# Patient Record
Sex: Male | Born: 1940 | Race: Black or African American | Hispanic: No | Marital: Married | Smoking: Former smoker
Health system: Southern US, Community
[De-identification: ages and names within clinical notes are randomized; demographics above are authoritative.]

## PROBLEM LIST (undated history)

## (undated) DIAGNOSIS — I219 Acute myocardial infarction, unspecified: Secondary | ICD-10-CM

## (undated) DIAGNOSIS — Z86718 Personal history of other venous thrombosis and embolism: Secondary | ICD-10-CM

## (undated) DIAGNOSIS — I2510 Atherosclerotic heart disease of native coronary artery without angina pectoris: Secondary | ICD-10-CM

## (undated) DIAGNOSIS — G4730 Sleep apnea, unspecified: Secondary | ICD-10-CM

## (undated) DIAGNOSIS — G4733 Obstructive sleep apnea (adult) (pediatric): Secondary | ICD-10-CM

## (undated) DIAGNOSIS — H269 Unspecified cataract: Secondary | ICD-10-CM

## (undated) DIAGNOSIS — E785 Hyperlipidemia, unspecified: Secondary | ICD-10-CM

## (undated) DIAGNOSIS — Z8739 Personal history of other diseases of the musculoskeletal system and connective tissue: Secondary | ICD-10-CM

## (undated) DIAGNOSIS — E669 Obesity, unspecified: Secondary | ICD-10-CM

## (undated) DIAGNOSIS — R609 Edema, unspecified: Secondary | ICD-10-CM

## (undated) DIAGNOSIS — I639 Cerebral infarction, unspecified: Secondary | ICD-10-CM

## (undated) DIAGNOSIS — M1990 Unspecified osteoarthritis, unspecified site: Secondary | ICD-10-CM

## (undated) DIAGNOSIS — I10 Essential (primary) hypertension: Secondary | ICD-10-CM

## (undated) DIAGNOSIS — N289 Disorder of kidney and ureter, unspecified: Secondary | ICD-10-CM

## (undated) HISTORY — DX: Personal history of other venous thrombosis and embolism: Z86.718

## (undated) HISTORY — DX: Obesity, unspecified: E66.9

## (undated) HISTORY — DX: Unspecified osteoarthritis, unspecified site: M19.90

## (undated) HISTORY — DX: Atherosclerotic heart disease of native coronary artery without angina pectoris: I25.10

## (undated) HISTORY — DX: Sleep apnea, unspecified: G47.30

## (undated) HISTORY — PX: OTHER SURGICAL HISTORY: SHX169

## (undated) HISTORY — DX: Acute myocardial infarction, unspecified: I21.9

## (undated) HISTORY — DX: Obstructive sleep apnea (adult) (pediatric): G47.33

## (undated) HISTORY — DX: Essential (primary) hypertension: I10

## (undated) HISTORY — DX: Cerebral infarction, unspecified: I63.9

## (undated) HISTORY — DX: Hyperlipidemia, unspecified: E78.5

## (undated) HISTORY — DX: Edema, unspecified: R60.9

## (undated) HISTORY — DX: Disorder of kidney and ureter, unspecified: N28.9

## (undated) HISTORY — DX: Personal history of other diseases of the musculoskeletal system and connective tissue: Z87.39

---

## 1998-03-02 DIAGNOSIS — I639 Cerebral infarction, unspecified: Secondary | ICD-10-CM

## 1998-03-02 HISTORY — DX: Cerebral infarction, unspecified: I63.9

## 1999-01-02 ENCOUNTER — Ambulatory Visit (HOSPITAL_BASED_OUTPATIENT_CLINIC_OR_DEPARTMENT_OTHER): Admission: RE | Admit: 1999-01-02 | Discharge: 1999-01-03 | Payer: Self-pay | Admitting: Urology

## 2003-03-03 HISTORY — PX: CORONARY ARTERY BYPASS GRAFT: SHX141

## 2003-03-17 ENCOUNTER — Inpatient Hospital Stay (HOSPITAL_COMMUNITY): Admission: EM | Admit: 2003-03-17 | Discharge: 2003-03-20 | Payer: Self-pay | Admitting: Emergency Medicine

## 2003-03-19 ENCOUNTER — Encounter (INDEPENDENT_AMBULATORY_CARE_PROVIDER_SITE_OTHER): Payer: Self-pay | Admitting: *Deleted

## 2003-04-21 ENCOUNTER — Emergency Department (HOSPITAL_COMMUNITY): Admission: EM | Admit: 2003-04-21 | Discharge: 2003-04-21 | Payer: Self-pay | Admitting: Emergency Medicine

## 2003-12-03 ENCOUNTER — Inpatient Hospital Stay (HOSPITAL_COMMUNITY): Admission: EM | Admit: 2003-12-03 | Discharge: 2003-12-14 | Payer: Self-pay | Admitting: Emergency Medicine

## 2004-01-28 ENCOUNTER — Encounter (HOSPITAL_COMMUNITY): Admission: RE | Admit: 2004-01-28 | Discharge: 2004-04-27 | Payer: Self-pay | Admitting: Cardiology

## 2004-02-01 ENCOUNTER — Ambulatory Visit: Payer: Self-pay

## 2004-03-12 ENCOUNTER — Ambulatory Visit: Payer: Self-pay | Admitting: Cardiology

## 2004-04-08 ENCOUNTER — Ambulatory Visit: Payer: Self-pay | Admitting: Cardiology

## 2004-04-21 ENCOUNTER — Ambulatory Visit (HOSPITAL_BASED_OUTPATIENT_CLINIC_OR_DEPARTMENT_OTHER): Admission: RE | Admit: 2004-04-21 | Discharge: 2004-04-21 | Payer: Self-pay | Admitting: Cardiology

## 2004-04-21 ENCOUNTER — Encounter: Payer: Self-pay | Admitting: Pulmonary Disease

## 2004-04-25 ENCOUNTER — Ambulatory Visit: Payer: Self-pay | Admitting: Pulmonary Disease

## 2004-04-28 ENCOUNTER — Encounter (HOSPITAL_COMMUNITY): Admission: RE | Admit: 2004-04-28 | Discharge: 2004-07-27 | Payer: Self-pay | Admitting: Cardiology

## 2004-06-27 ENCOUNTER — Ambulatory Visit: Payer: Self-pay | Admitting: Pulmonary Disease

## 2004-07-25 ENCOUNTER — Ambulatory Visit: Payer: Self-pay | Admitting: Pulmonary Disease

## 2004-08-08 ENCOUNTER — Ambulatory Visit: Payer: Self-pay | Admitting: Cardiology

## 2004-11-18 ENCOUNTER — Ambulatory Visit: Payer: Self-pay | Admitting: Cardiology

## 2005-04-22 ENCOUNTER — Inpatient Hospital Stay (HOSPITAL_COMMUNITY): Admission: EM | Admit: 2005-04-22 | Discharge: 2005-04-24 | Payer: Self-pay | Admitting: Emergency Medicine

## 2005-08-28 ENCOUNTER — Ambulatory Visit: Payer: Self-pay | Admitting: Internal Medicine

## 2005-10-09 ENCOUNTER — Ambulatory Visit: Payer: Self-pay | Admitting: Internal Medicine

## 2005-10-12 ENCOUNTER — Emergency Department (HOSPITAL_COMMUNITY): Admission: EM | Admit: 2005-10-12 | Discharge: 2005-10-13 | Payer: Self-pay | Admitting: Emergency Medicine

## 2006-02-12 ENCOUNTER — Ambulatory Visit: Payer: Self-pay | Admitting: Internal Medicine

## 2006-07-15 ENCOUNTER — Ambulatory Visit: Payer: Self-pay | Admitting: Internal Medicine

## 2007-02-15 ENCOUNTER — Ambulatory Visit: Payer: Self-pay | Admitting: Internal Medicine

## 2007-02-15 LAB — CONVERTED CEMR LAB
ALT: 16 units/L (ref 0–53)
AST: 20 units/L (ref 0–37)
Cholesterol: 112 mg/dL (ref 0–200)
HDL: 26.7 mg/dL — ABNORMAL LOW (ref >39.0)
LDL Cholesterol: 69 mg/dL (ref 0–99)
Total CHOL/HDL Ratio: 4.2
Triglycerides: 84 mg/dL (ref 0–149)
VLDL: 17 mg/dL (ref 0–40)

## 2007-03-03 DIAGNOSIS — I219 Acute myocardial infarction, unspecified: Secondary | ICD-10-CM

## 2007-03-03 HISTORY — DX: Acute myocardial infarction, unspecified: I21.9

## 2007-03-18 ENCOUNTER — Ambulatory Visit: Payer: Self-pay | Admitting: Internal Medicine

## 2007-12-01 ENCOUNTER — Ambulatory Visit: Payer: Self-pay | Admitting: Internal Medicine

## 2008-05-10 ENCOUNTER — Encounter: Admission: RE | Admit: 2008-05-10 | Discharge: 2008-05-10 | Payer: Self-pay | Admitting: Internal Medicine

## 2008-07-16 ENCOUNTER — Telehealth: Payer: Self-pay | Admitting: Internal Medicine

## 2008-08-13 LAB — HM COLONOSCOPY

## 2008-10-09 ENCOUNTER — Encounter (INDEPENDENT_AMBULATORY_CARE_PROVIDER_SITE_OTHER): Payer: Self-pay | Admitting: Emergency Medicine

## 2008-10-09 ENCOUNTER — Inpatient Hospital Stay (HOSPITAL_COMMUNITY): Admission: EM | Admit: 2008-10-09 | Discharge: 2008-10-12 | Payer: Self-pay | Admitting: Emergency Medicine

## 2008-10-09 ENCOUNTER — Encounter (INDEPENDENT_AMBULATORY_CARE_PROVIDER_SITE_OTHER): Payer: Self-pay | Admitting: Internal Medicine

## 2008-10-09 ENCOUNTER — Ambulatory Visit: Payer: Self-pay | Admitting: Vascular Surgery

## 2009-01-16 ENCOUNTER — Telehealth (INDEPENDENT_AMBULATORY_CARE_PROVIDER_SITE_OTHER): Payer: Self-pay | Admitting: *Deleted

## 2009-04-01 ENCOUNTER — Encounter: Payer: Self-pay | Admitting: Internal Medicine

## 2009-05-10 ENCOUNTER — Encounter: Payer: Self-pay | Admitting: Internal Medicine

## 2009-05-13 ENCOUNTER — Encounter: Payer: Self-pay | Admitting: Internal Medicine

## 2009-05-30 DIAGNOSIS — I2510 Atherosclerotic heart disease of native coronary artery without angina pectoris: Secondary | ICD-10-CM | POA: Insufficient documentation

## 2009-05-30 DIAGNOSIS — E785 Hyperlipidemia, unspecified: Secondary | ICD-10-CM

## 2009-05-30 DIAGNOSIS — I10 Essential (primary) hypertension: Secondary | ICD-10-CM | POA: Insufficient documentation

## 2009-05-30 DIAGNOSIS — G4733 Obstructive sleep apnea (adult) (pediatric): Secondary | ICD-10-CM

## 2009-05-31 ENCOUNTER — Ambulatory Visit: Payer: Self-pay | Admitting: Internal Medicine

## 2009-05-31 DIAGNOSIS — E669 Obesity, unspecified: Secondary | ICD-10-CM

## 2009-06-03 ENCOUNTER — Encounter: Payer: Self-pay | Admitting: Internal Medicine

## 2009-06-25 ENCOUNTER — Ambulatory Visit: Payer: Self-pay | Admitting: Pulmonary Disease

## 2009-07-30 ENCOUNTER — Ambulatory Visit: Payer: Self-pay | Admitting: Pulmonary Disease

## 2009-08-19 ENCOUNTER — Telehealth: Payer: Self-pay | Admitting: Pulmonary Disease

## 2009-08-29 ENCOUNTER — Encounter: Admission: RE | Admit: 2009-08-29 | Discharge: 2009-08-29 | Payer: Self-pay | Admitting: Internal Medicine

## 2009-11-16 ENCOUNTER — Encounter: Payer: Self-pay | Admitting: Pulmonary Disease

## 2009-11-21 ENCOUNTER — Encounter
Admission: RE | Admit: 2009-11-21 | Discharge: 2009-11-28 | Payer: Self-pay | Source: Home / Self Care | Admitting: Pulmonary Disease

## 2009-12-03 ENCOUNTER — Emergency Department (HOSPITAL_COMMUNITY): Admission: EM | Admit: 2009-12-03 | Discharge: 2009-12-03 | Payer: Self-pay | Admitting: Emergency Medicine

## 2010-02-03 ENCOUNTER — Telehealth: Payer: Self-pay | Admitting: Internal Medicine

## 2010-02-05 ENCOUNTER — Telehealth: Payer: Self-pay | Admitting: Pulmonary Disease

## 2010-02-11 ENCOUNTER — Ambulatory Visit: Payer: Self-pay | Admitting: Pulmonary Disease

## 2010-03-20 ENCOUNTER — Encounter
Admission: RE | Admit: 2010-03-20 | Discharge: 2010-03-20 | Payer: Self-pay | Source: Home / Self Care | Attending: Internal Medicine | Admitting: Internal Medicine

## 2010-04-01 NOTE — Miscellaneous (Signed)
Summary: optimal pressure 11cm  Clinical Lists Changes  Orders: Added new Referral order of DME Referral (DME) - Signed auto download shows good compliance, no leak of signif., and optimal pressure11cm

## 2010-04-01 NOTE — Miscellaneous (Signed)
  Clinical Lists Changes  Medications: Removed medication of NIASPAN 500 MG CR-TABS (NIACIN (ANTIHYPERLIPIDEMIC)) Take 1 by mouth at bedtime Added new medication of NIASPAN 1000 MG CR-TABS (NIACIN (ANTIHYPERLIPIDEMIC)) 1 tab evry night before bedtime - Signed Rx of NIASPAN 1000 MG CR-TABS (NIACIN (ANTIHYPERLIPIDEMIC)) 1 tab evry night before bedtime;  #30 x 6;  Signed;  Entered by: Francetta Found, RN, BSN;  Authorized by: Annye Rusk, MD, [facility];  Method used: Electronically to [facility] [address] [place], [place], [place]  [address], Ph: [phone], Fax: [phone]    Prescriptions: NIASPAN 1000 MG CR-TABS (NIACIN (ANTIHYPERLIPIDEMIC)) 1 tab evry night before bedtime  #30 x 6   Entered by:   Francetta Found, RN, BSN   Authorized by:   Annye Rusk, MD, [facility]   Signed by:   Francetta Found, RN, BSN on 06/03/2009   Method used:   Electronically to        [facility] (retail)       [address]       [place], [place]  [address]       Ph: [phone]       Fax: [phone]   RxID:   [id number]

## 2010-04-01 NOTE — Assessment & Plan Note (Signed)
Summary: rov/sob/jss   Primary Provider:  Marlou Sa  CC:  ROV and recommended by Dr. Jerilee Hoh complaints.  History of Present Illness: Patient is a 70 year old with a history of CAD, dyslpiidemia, hypertension and sleep apnea He was last seen several years ago.  He is s/p CABG x 4 in 2005 (LIMA to LAD; SVG to OM1/OM2; SVG to RCA). Since seen he denies chest pain.  Breathing is ok.  He is not using his CPAP  He has a hard time tolerating it.    Problems Prior to Update: 1)  Obstructive Sleep Apnea  (ICD-327.23) 2)  Hyperlipidemia  (ICD-272.4) 3)  Hypertension  (ICD-401.9) 4)  Cad  (ICD-414.00)  Current Medications (verified): 1)  Viagra 50 Mg Tabs (Sildenafil Citrate) .... Take 1 Tablet By Mouth Single Dose 2)  Lovastatin 40 Mg Tabs (Lovastatin) .[name].. 1 Tab Once Daily 3)  Metoprolol Tartrate 25 Mg Tabs (Metoprolol Tartrate) .... Take 3 By Mouth Two Times A Day 4)  Aspirin 81 Mg Tbec (Aspirin) .... Take One Tablet By Mouth Daily 5)  Furosemide 20 Mg Tabs (Furosemide) .... Take One Tablet By Mouth Two Times A Day 6)  Benicar 40 Mg Tabs (Olmesartan Medoxomil) .... Take One Tablet By Mouth Daily 7)  Niaspan 500 Mg Cr-Tabs (Niacin (Antihyperlipidemic)) .... Take 1 By Mouth At Bedtime  Allergies (verified): 1)  ! * Altace  Past History:  Past medical, surgical, family and social histories (including risk factors) reviewed, and no changes noted (except as noted below).  Past Medical History:  1. Coronary artery disease, status post a cath in 2005, with a       quadruple vessel bypass.   2. Hypertension.   3. Hyperlipidemia.   4. sleep apnea  Past Surgical History: Reviewed history from 05/30/2009 and no changes required.  1.  CABG.  2.  Left inguinal hernia repair.  Family History: Reviewed history from 05/30/2009 and no changes required.  Significant for coronary artery disease in multiple   family members.   Social History: Reviewed history from 05/30/2009 and no changes  required.  He used to smoke cigars, quit in 2005, when he had his   bypass surgery, none since. He denies any alcohol or illicit drug use.   He is married to his [name].   Review of Systems       All systems reviwed.  Negative to the above problem.  Vital Signs:  Patient profile:   70 year old male Height:      74 inches Weight:      307 pounds BMI:     39.56 Pulse rate:   69 / minute BP sitting:   158 / 84  (left arm)  Vitals Entered By: Eliezer Lofts, EMT-P (May 31, 2009 2:05 PM)  Physical Exam  Additional Exam:  Obese 70 year old in NAD HEENT:  Normocephalic, atraumatic. EOMI, PERRLA.  Neck: JVP is normal. No thyromegaly. No bruits.  Lungs: clear to auscultation. No rales no wheezes.  Heart: Regular rate and rhythm. Normal S1, S2. No S3.   No significant murmurs. PMI not displaced.  Abdomen:  Supple, nontender. Normal bowel sounds. No masses. No hepatomegaly.  Extremities:   Good distal pulses throughout. No lower extremity edema.  Musculoskeletal :moving all extremities.  Neuro:   alert and oriented x3.    EKG  Procedure date:  05/31/2009  Findings:      NSR.  69 bpm.  First degree AV block.  T wave inversion I,  AVL  Impression & Recommendations:  Problem # 1:  CAD (ICD-414.00)  Doing well clinically.  Now about 6 yers from CABG.  Work on risk factor modification.  His updated medication list for this problem includes:    Metoprolol Tartrate 25 Mg Tabs (Metoprolol tartrate) .[name]... Take 3 by mouth two times a day    Aspirin 81 Mg Tbec (Aspirin) .[name]... Take one tablet by mouth daily  Problem # 2:  HYPERLIPIDEMIA (ICD-272.4) Will get lipid panel from Dr. Randel Pigg office Will refer to dietary Encouraged diet changes and exercise. His updated medication list for this problem includes:    Lovastatin 40 Mg Tabs (Lovastatin) .[name]... 1 tab once daily    Niaspan 500 Mg Cr-tabs (Niacin (antihyperlipidemic)) .[name]... Take 1 by mouth at bedtime  Problem # 3:  HYPERTENSION  (ICD-401.9) Not optimally controlled.  Will leave to Dr. Marlou Sa.  Paitent reports that it is higher than usual. should be on CPAP.  Apnea may be exacerbating HTN Will refer to K. Clance who he has seen in past. His updated medication list for this problem includes:    Metoprolol Tartrate 25 Mg Tabs (Metoprolol tartrate) .[name]... Take 3 by mouth two times a day    Aspirin 81 Mg Tbec (Aspirin) .[name]... Take one tablet by mouth daily    Furosemide 20 Mg Tabs (Furosemide) .[name]... Take one tablet by mouth two times a day    Benicar 40 Mg Tabs (Olmesartan medoxomil) .[name]... Take one tablet by mouth daily  Problem # 4:  OBSTRUCTIVE SLEEP APNEA (ICD-327.23)  Orders: Pulmonary Referral (Pulmonary)  Other Orders: EKG w/ Interpretation (93000) Nutrition Referral (Nutrition)  Patient Instructions: 1)  You have been referred to [facility] for sleep apnea. 2)  Your physician wants you to follow-up in: 12 months with Dr.Galen Malkowski  You will receive a reminder letter in the mail two months in advance. If you don't receive a letter, please call our office to schedule the follow-up appointment. 3)  An appointment for you to see the Dietician will  been scheduled.

## 2010-04-01 NOTE — Consult Note (Signed)
Summary: Sleep [place] By: Phillis Knack 06/25/2009 14:14:39  _____________________________________________________________________  External Attachment:    Type:   Image     Comment:   External Document

## 2010-04-01 NOTE — Assessment & Plan Note (Signed)
Summary: consult for osa   Copy to:  Dr. Dorris Carnes Primary Provider/Referring Provider:  Marlou Sa  CC:  Sleep consult.   Epworth score is 12.  Last sleep study done in 2006...  History of Present Illness: The pt is a 70y/o male who comes in today for management of osa.  He was first diagnosed in 2006 with very severe osa, with AHI 98/hr and desat to 73%.  He was titrated to a cpap pressure of 15cm with excellent control.  The pt was started on cpap, and I have never seen him since.  He tells me he got the cpap machine, and did not wear even one night.  His wife has not commented on his snoring, but they haven't really discussed.  He is going to bed 23mn, and arising at 10am to start his day.  He does not feel unrested upon arising, but does note sleepiness in the morning hours if he tries to read or watch tv.  He denies napping during the day though.  He also dozes in the chair in the evenings while watching tv.  He has occasional sleep pressure with driving longer distances.  Of note, his weight is up 18 pounds since his last evaluation, and his epworth score today is 12.  Preventive Screening-Counseling & Management  Alcohol-Tobacco     Smoking Status: quit     Year Quit: 2005     Cigars/week: 2-3 weekly x 2 years  Current Medications (verified): 1)  Lovastatin 40 Mg Tabs (Lovastatin) .[name].. 1 Tab Once Daily 2)  Metoprolol Tartrate 25 Mg Tabs (Metoprolol Tartrate) .... Take 3 By Mouth Two Times A Day 3)  Aspirin 81 Mg Tbec (Aspirin) .... Take One Tablet By Mouth Daily 4)  Furosemide 20 Mg Tabs (Furosemide) .... Take One Tablet By Mouth Two Times A Day 5)  Benicar 40 Mg Tabs (Olmesartan Medoxomil) .... Take One Tablet By Mouth Daily 6)  Niaspan 500 Mg Cr-Tabs (Niacin (Antihyperlipidemic)) .[name].. 1 By Mouth At Bedtime  Allergies (verified): 1)  ! * Altace  Past History:  Past Medical History:  1. Coronary artery disease, status post a cath in 2005, with a       quadruple vessel bypass.   2. Hypertension.   3. Hyperlipidemia.   4. OSA.Marland KitchenAHI 98/hr  Past Surgical History: Reviewed history from 05/30/2009 and no changes required.  1.  CABG.  2.  Left inguinal hernia repair.  Family History: Reviewed history from 05/30/2009 and no changes required.  Significant for coronary artery disease in multiple   family members.   Social History: Reviewed history from 05/30/2009 and no changes required.  He used to smoke cigars, quit in 2005, when he had his   bypass surgery, none since. He denies any alcohol or illicit drug use.   He is married to his [name]. Smoking Status:  quit Cigars/week:  2-3 weekly x 2 years  Review of Systems       The patient complains of shortness of breath with activity, weight change, and hand/feet swelling.  The patient denies shortness of breath at rest, productive cough, non-productive cough, coughing up blood, chest pain, irregular heartbeats, acid heartburn, indigestion, loss of appetite, abdominal pain, difficulty swallowing, sore throat, tooth/dental problems, headaches, nasal congestion/difficulty breathing through nose, sneezing, itching, ear ache, anxiety, depression, joint stiffness or pain, rash, change in color of mucus, and fever.    Vital Signs:  Patient profile:   70 year old male Height:  74 inches (187.96 cm) Weight:      313 pounds (142.27 kg) BMI:     40.33 O2 Sat:      100 % on Room air Temp:     98.1 degrees F (36.72 degrees C) oral Pulse rate:   82 / minute BP sitting:   130 / 88  (right arm) Cuff size:   large  Vitals Entered By: Francesca Jewett CMA (June 25, 2009 10:19 AM)  O2 Sat at Rest %:  100 O2 Flow:  Room air CC: Sleep consult.   Epworth score is 12.  Last sleep study done in 2006.[name]   Physical Exam  General:  obese male in nad Eyes:  PERRLA and EOMI.   Nose:  large turbs but patent airway, septum midline Mouth:  elongation of soft palate and uvula, prominent sidewall redundancy Neck:  no jvd, tmg,  LN Lungs:  clear to auscultation Heart:  rrr, no mrg Abdomen:  soft and nontender, bs+ Extremities:  3+ edema bilat, no cyanosis pulses decreased distally, but present. Neurologic:  appears sleepy, but appropriate. moves all 4.   Impression & Recommendations:  Problem # 1:  OBSTRUCTIVE SLEEP APNEA (ICD-327.23) the pt has a h/o very severe osa that is certainly impacting his CV health.  He never started on cpap back in 2006, but still has his equipment intact.  I have again gone over the pathophysiology of sleep apnea with him, and why it is important for [place] to treat with his CV issues.  He is also clearly sleepy today.  He is willing to give cpap a better trial, and will therefore have his equipment checked to make sure is in working order.  He will followup with me in 5 weeks.  I have also encouraged him to work aggressively on weight loss.  Medications Added to Medication List This Visit: 1)  Niaspan 500 Mg Cr-tabs (Niacin (antihyperlipidemic)) .[name].. 1 by mouth at bedtime  Other Orders: Consultation Level IV [id number]) DME Referral (DME)  Patient Instructions: 1)  will start on cpap.  Please call me if any issues with tolerance 2)  followup with me in 5 weeks 3)  work on weight loss

## 2010-04-01 NOTE — Progress Notes (Signed)
Summary: nos appt  Phone Note Call from Patient   Caller: [email or web address]  Call For: Leyah Bocchino Summary of Call: Rsc nos from 12/6 to 12/13. Initial call taken by: Netta Neat,  February 05, 2010 11:10 AM

## 2010-04-01 NOTE — Letter (Signed)
Summary: [facility]  [facility]   Imported By: Marilynne Drivers 12/16/2009 11:12:43  _____________________________________________________________________  External Attachment:    Type:   Image     Comment:   External Document

## 2010-04-01 NOTE — Progress Notes (Signed)
Summary: cpap auto  Phone Note Call from Patient   Caller: [email or web address]  Call For: Rayleen Wyrick Summary of Call: pt has been called several times and even one time made an appt but has not been set up for auto yet fyi Initial call taken by: Ova Freshwater,  August 19, 2009 2:07 PM  Follow-up for Phone Call        keep trying./ Follow-up by: Kathee Delton MD,  August 19, 2009 4:27 PM  Additional Follow-up for Phone Call Additional follow up Details #1::        ok Additional Follow-up by: Ova Freshwater,  August 19, 2009 4:59 PM

## 2010-04-01 NOTE — Progress Notes (Signed)
Summary: talk with nurse  Phone Note Call from Patient Call back at Home Phone [phone] Call back at [phone] cell   [place]: Spouse Reason for Call: Talk to Nurse Summary of Call: Need to talk with Kennyth Lose Initial call taken by: Mack Guise,  February 03, 2010 2:28 PM  Follow-up for Phone Call        Called patient's wife back. She advised me that she needs a disability form completed for her husband. She will bring it to our office this week for completion.   Francetta Found, RN, BSN  February 03, 2010 4:39 PM

## 2010-04-01 NOTE — Assessment & Plan Note (Signed)
Summary: rov for osa, cpap adjustments.   Copy to:  Dr. Dorris Carnes Primary Provider/Referring Provider:  Marlou Sa  CC:  Pt is here for a 5 week f/u appt.  Pt states he is wearing his cpap machine every night.  Approx 3 to 4 hours per night.  Pt denied any problems with mask or pressure.  Pt states he feels more rested when he wakes up in the morning.  Pt denied sob, cough, edema, sore throat, and and head congestion.  [name]  History of Present Illness: the pt comes in today for f/u of his osa.  He was started on cpap at the last visit, and has been doing well with the device.  He has no problems currently with mask fit or pressure tolerance, and feels that he is sleeping much better with improved daytime alertness.  He is only wearing 3-4 hrs a night, but this is because he is not getting to bed at a reasonable hour.  I have asked him to work on this.  He is currently using a nasal mask, and states that he has learned to keep his mouth closed.  Medications Prior to Update: 1)  Lovastatin 40 Mg Tabs (Lovastatin) .[name].. 1 Tab Once Daily 2)  Metoprolol Tartrate 25 Mg Tabs (Metoprolol Tartrate) .... Take 3 By Mouth Two Times A Day 3)  Aspirin 81 Mg Tbec (Aspirin) .... Take One Tablet By Mouth Daily 4)  Furosemide 20 Mg Tabs (Furosemide) .... Take One Tablet By Mouth Two Times A Day 5)  Benicar 40 Mg Tabs (Olmesartan Medoxomil) .... Take One Tablet By Mouth Daily 6)  Niaspan 500 Mg Cr-Tabs (Niacin (Antihyperlipidemic)) .[name].. 1 By Mouth At Bedtime  Allergies (verified): 1)  ! * Altace  Review of Systems  The patient denies shortness of breath with activity, shortness of breath at rest, productive cough, non-productive cough, coughing up blood, chest pain, irregular heartbeats, acid heartburn, indigestion, loss of appetite, weight change, abdominal pain, difficulty swallowing, sore throat, tooth/dental problems, headaches, nasal congestion/difficulty breathing through nose, sneezing, itching, ear ache,  anxiety, depression, hand/feet swelling, joint stiffness or pain, rash, change in color of mucus, and fever.    Vital Signs:  Patient profile:   70 year old male Height:      74 inches Weight:      301 pounds BMI:     38.79 O2 Sat:      97 % on Room air Temp:     98.0 degrees F oral Pulse rate:   51 / minute BP sitting:   152 / 84  (left arm) Cuff size:   large  Vitals Entered By: Matthew Folks LPN (May 31, 624THL 075-GRM PM)  O2 Flow:  Room air CC: Pt is here for a 5 week f/u appt.  Pt states he is wearing his cpap machine every night.  Approx 3 to 4 hours per night.  Pt denied any problems with mask or pressure.  Pt states he feels more rested when he wakes up in the morning.  Pt denied sob, cough, edema, sore throat, and head congestion.   Comments Medications reviewed with patient Matthew Folks LPN  May 31, 624THL 075-GRM PM    Physical Exam  General:  obese male in nad Nose:  turbinate hypertrophy with deviated septum, but no crusting or excoriation from cpap mask Mouth:  clear Extremities:  1-2+ edema bilat, no cyanosis pulses intact distally Neurologic:  alert, not sleepy, moves all 4.   Impression & Recommendations:  Problem # 1:  OBSTRUCTIVE SLEEP APNEA (ICD-327.23) the pt is adapting to the machine, but needs to wear for a longer period of time at night.  I have told him that 6hrs is considered the standard.  He has already seen a difference in how he feels, and also his alertness during the day.  We need to optimize his pressure for him, and he needs to work aggressively on weight loss.  Will refer to a nutritionist for assistance.  Care Plan:  At this point, will arrange for the patient's machine to be changed over to auto mode for 2 weeks to optimize their pressure.  I will review the downloaded data once sent by dme, and also evaluate for compliance, leaks, and residual osa.  I will call the patient and dme to discuss the results, and have the patient's machine set  appropriately.  This will serve as the pt's cpap pressure titration.  Other Orders: Est. Patient Level IV [id number]) DME Referral (DME) Nutrition Referral (Nutrition)  Patient Instructions: 1)  will set your machine to automatic mode for 2 weeks to optimize your pressure.  I will let you know the results. 2)  try and wear cpap 6hrs each night. 3)  work on weight loss.  Will refer to nutrition at [place]. 4)  followup with me in 43mos if doing well.

## 2010-06-07 LAB — URINALYSIS, ROUTINE W REFLEX MICROSCOPIC
Bilirubin Urine: NEGATIVE
Hgb urine dipstick: NEGATIVE
Specific Gravity, Urine: 1.013 (ref 1.005–1.030)
Urobilinogen, UA: 1 mg/dL (ref 0.0–1.0)
pH: 5.5 (ref 5.0–8.0)

## 2010-06-07 LAB — CBC
Hemoglobin: 11.8 g/dL — ABNORMAL LOW (ref 13.0–17.0)
MCHC: 33 g/dL (ref 30.0–36.0)
MCHC: 33.1 g/dL (ref 30.0–36.0)
MCHC: 33.8 g/dL (ref 30.0–36.0)
MCHC: 33.8 g/dL (ref 30.0–36.0)
MCV: 85.5 fL (ref 78.0–100.0)
Platelets: 212 10*3/uL (ref 150–400)
Platelets: 232 10*3/uL (ref 150–400)
RDW: 15.7 % — ABNORMAL HIGH (ref 11.5–15.5)
RDW: 16.1 % — ABNORMAL HIGH (ref 11.5–15.5)
RDW: 16.1 % — ABNORMAL HIGH (ref 11.5–15.5)
RDW: 16.5 % — ABNORMAL HIGH (ref 11.5–15.5)

## 2010-06-07 LAB — DIFFERENTIAL
Basophils Absolute: 0 10*3/uL (ref 0.0–0.1)
Basophils Relative: 0 % (ref 0–1)
Monocytes Absolute: 1.1 10*3/uL — ABNORMAL HIGH (ref 0.1–1.0)
Neutro Abs: 14.3 10*3/uL — ABNORMAL HIGH (ref 1.7–7.7)
Neutrophils Relative %: 88 % — ABNORMAL HIGH (ref 43–77)

## 2010-06-07 LAB — POCT I-STAT, CHEM 8
Creatinine, Ser: 1.4 mg/dL (ref 0.4–1.5)
Glucose, Bld: 117 mg/dL — ABNORMAL HIGH (ref 70–99)
Hemoglobin: 14.3 g/dL (ref 13.0–17.0)
Potassium: 4.2 mEq/L (ref 3.5–5.1)

## 2010-06-07 LAB — CULTURE, BLOOD (ROUTINE X 2)
Culture: NO GROWTH
Culture: NO GROWTH
Culture: NO GROWTH

## 2010-06-07 LAB — COMPREHENSIVE METABOLIC PANEL
ALT: 16 U/L (ref 0–53)
AST: 28 U/L (ref 0–37)
Calcium: 8.8 mg/dL (ref 8.4–10.5)
Creatinine, Ser: 1.68 mg/dL — ABNORMAL HIGH (ref 0.4–1.5)
GFR calc Af Amer: 50 mL/min — ABNORMAL LOW (ref >60)
Glucose, Bld: 113 mg/dL — ABNORMAL HIGH (ref 70–99)
Sodium: 137 mEq/L (ref 135–145)
Total Protein: 7 g/dL (ref 6.0–8.3)

## 2010-06-07 LAB — BASIC METABOLIC PANEL
CO2: 25 mEq/L (ref 19–32)
Calcium: 9.3 mg/dL (ref 8.4–10.5)
Calcium: 9.3 mg/dL (ref 8.4–10.5)
Creatinine, Ser: 1.45 mg/dL (ref 0.4–1.5)
GFR calc Af Amer: >60 mL/min (ref >60)
GFR calc non Af Amer: 49 mL/min — ABNORMAL LOW (ref >60)
GFR calc non Af Amer: 54 mL/min — ABNORMAL LOW (ref >60)
Glucose, Bld: 100 mg/dL — ABNORMAL HIGH (ref 70–99)
Potassium: 4.1 mEq/L (ref 3.5–5.1)
Sodium: 135 mEq/L (ref 135–145)
Sodium: 139 mEq/L (ref 135–145)

## 2010-06-07 LAB — LIPID PANEL
Cholesterol: 121 mg/dL (ref 0–200)
HDL: 35 mg/dL — ABNORMAL LOW (ref >39)
Total CHOL/HDL Ratio: 3.5 RATIO
Triglycerides: 71 mg/dL (ref <150)

## 2010-06-07 LAB — HEMOGLOBIN A1C
Hgb A1c MFr Bld: 5.6 % (ref 4.6–6.1)
Mean Plasma Glucose: 114 mg/dL

## 2010-06-07 LAB — VANCOMYCIN, TROUGH: Vancomycin Tr: 23.3 ug/mL — ABNORMAL HIGH (ref 10.0–20.0)

## 2010-06-07 LAB — PROTIME-INR: INR: 1.1 (ref 0.00–1.49)

## 2010-06-13 ENCOUNTER — Other Ambulatory Visit: Payer: Self-pay | Admitting: Internal Medicine

## 2010-07-02 ENCOUNTER — Encounter: Payer: Self-pay | Admitting: Internal Medicine

## 2010-07-04 ENCOUNTER — Ambulatory Visit (INDEPENDENT_AMBULATORY_CARE_PROVIDER_SITE_OTHER): Payer: Medicare Other | Admitting: Internal Medicine

## 2010-07-04 ENCOUNTER — Encounter: Payer: Self-pay | Admitting: Internal Medicine

## 2010-07-04 VITALS — BP 150/90 | HR 61 | Ht 74.0 in | Wt 295.0 lb

## 2010-07-04 DIAGNOSIS — I2510 Atherosclerotic heart disease of native coronary artery without angina pectoris: Secondary | ICD-10-CM

## 2010-07-04 DIAGNOSIS — G4733 Obstructive sleep apnea (adult) (pediatric): Secondary | ICD-10-CM

## 2010-07-04 DIAGNOSIS — E785 Hyperlipidemia, unspecified: Secondary | ICD-10-CM

## 2010-07-04 DIAGNOSIS — I10 Essential (primary) hypertension: Secondary | ICD-10-CM

## 2010-07-04 NOTE — Progress Notes (Signed)
HPIPatient is a 70 year old with a history of CAD, dyslpiidemia, hypertension and sleep apnea He was last seen several years ago.  He is s/p CABG x 4 in 2005 (LIMA to LAD; SVG to OM1/OM2; SVG to RCA).I saw him in clinic 1 year ago Since seen he has done well.  No chest pains.  No SOB.  He is now using CPAP.  Tolerating new mask. Says he takes his BP at home and it is better than now.  Allergies  Allergen Reactions  . Ramipril     Current Outpatient Prescriptions  Medication Sig Dispense Refill  . aspirin 81 MG tablet Take 81 mg by mouth daily.        . furosemide (LASIX) 20 MG tablet Take 20 mg by mouth 2 (two) times daily.        [name] lovastatin (MEVACOR) 40 MG tablet TAKE ONE TABLET BY MOUTH EVERY DAY  30 tablet  6  . metoprolol tartrate (LOPRESSOR) 25 MG tablet Take 3 by mouth two times a day       . niacin (NIASPAN) 500 MG CR tablet Take 500 mg by mouth at bedtime.        [name] olmesartan (BENICAR) 40 MG tablet Take 40 mg by mouth daily.          Past Medical History  Diagnosis Date  . CAD (coronary artery disease)     s/p cath in 2005 and CABG x4  . HTN (hypertension)   . Hyperlipidemia   . OSA (obstructive sleep apnea)     AHI 98/hr    Past Surgical History  Procedure Date  . Coronary artery bypass graft   . Left inguinal hernia repair     Family History  Problem Relation Age of Onset  . Coronary artery disease      significant in multiple family members    History   Social History  . Marital Status: Married    Spouse Name: [name]    Number of Children: N/A  . Years of Education: N/A   Occupational History  . Not on file.   Social History Main Topics  . Smoking status: Former Smoker    Types: Cigars  . Smokeless tobacco: Not on file   Comment: used to smoke cigars, 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, none since  . Alcohol Use: No  . Drug Use: No  . Sexually Active: Not on file   Other Topics Concern  . Not on file   Social History Narrative  . No narrative  on file    Review of Systems:  All systems reviewed.  They are negative to the above problem except as previously stated.  Vital Signs: BP 150/90  Pulse 61  Ht 6\' 2"  (1.88 m)  Wt 295 lb (133.811 kg)  BMI 37.88 kg/m2  Physical Exam  HEENT:  Normocephalic, atraumatic. EOMI, PERRLA.  Neck: JVP is normal. No thyromegaly. No bruits.  Lungs: clear to auscultation. No rales no wheezes.  Heart: Regular rate and rhythm. Normal S1, S2. No S3.   No significant murmurs. PMI not displaced.  Abdomen:  Supple, nontender. Normal bowel sounds. No masses. No hepatomegaly.  Extremities:   Good distal pulses throughout. No lower extremity edema.  Musculoskeletal :moving all extremities.  Neuro:   alert and oriented x3.  CN II-XII grossly intact.  SR.  LVH.  Repol abnormality.  Assessment and Plan:

## 2010-07-05 NOTE — Assessment & Plan Note (Signed)
No symptoms of angina.  Continue to follow.  Encouraged him to increase his activity.

## 2010-07-05 NOTE — Assessment & Plan Note (Signed)
He says BP is usually lower.  I have asked him to take BP cuff in to get it calibrated.  Call if BP runs 150/ or above.

## 2010-07-05 NOTE — Assessment & Plan Note (Signed)
Continue CPAP.  

## 2010-07-05 NOTE — Assessment & Plan Note (Signed)
Will need to be followed.

## 2010-07-15 NOTE — Assessment & Plan Note (Signed)
[place] OFFICE NOTE   NAME:FOUSTNahzir, Schult                        MRN:          [id number]  DATE:03/18/2007                            DOB:          October 13, 1940    IDENTIFICATION:  Mr. Litterer is a 70 year old gentleman who was last seen  in May of last year.  He has a history of CAD, hypertension, sleep  apnea, and dyslipidemia.   Since seen, he has been doing okay.  He denies chest pain.  No  significant shortness of breath.  I asked him if he was active in any  physical program, and he denied this.  He is now retired.   CURRENT MEDICATIONS:  1. Metoprolol 25 b.i.d.  2. Lovastatin 40 at bedtime.  3. Aspirin 81.  4. Hydrochlorothiazide 12.5 daily.  5. Lasix 20.  6. Niaspan 500.   REVIEW OF SYSTEMS:  Has flushing with Niaspan.   PHYSICAL EXAMINATION:  GENERAL:  The patient is in no distress.  VITAL SIGNS:  Blood pressure 146/84, pulse is 68, weight 311, up on 307  on the last visit.  LUNGS:  Clear.  CARDIAC:  Regular rate and rhythm, S1, S2.  No S3, no murmurs.  ABDOMEN:  Obese, benign.  EXTREMITIES:  No edema.   IMPRESSION:  1. Coronary artery disease.  Appears be stable .  2. Hypertension. Minimally better than before.  I talked to him about      weight loss.  I am not sure how Benicar got discontinued.  Need to      follow.  3. Dyslipidemia.  He is flushing with the 500 of Niaspan.  I am      reluctant to go up on this.  I have encouraged him to increase his      exercise and get his weight down, which will help his HDL.  He has      promised to enter into a walking program.  Labs from February 15, 2007 showed total cholesterol 112, HDL 27, LDL of 69, triglycerides      84.   I will set followup for next summer.  Again, encouraged him to get into  a walking regimen.     Fay Records, MD, [facility]  Electronically Signed    PVR/MedQ  DD: 03/21/2007  DT: 03/21/2007  Job #: [id number]

## 2010-07-15 NOTE — Assessment & Plan Note (Signed)
[place] OFFICE NOTE   NAME:FOUSTBernell, Sekhon                        MRN:          [id number]  DATE:12/01/2007                            DOB:          June 29, 1940    IDENTIFICATION:  Mr. [name] is a 70 year old gentleman with a history of  CAD, hypertension, sleep apnea and dyslipidemia.  I last saw him in  January.   The patient comes in today.  He is actually quite confused about his  medicines.  He is on Lipitor now.  He had been on lovastatin.  He was  taken off of the Benicar.  He is on the Lasix not HCTZ.  He is also  being treated with Indocin for presenting gout.   He denies chest pain.  His breathing is okay.  He thinks his right ankle  is starting to have a gouty flare.   CURRENT MEDICINES:  1. Aspirin 81.  2. Lasix 20.  3. Niaspan 500.  4. Indocin 75 b.i.d.   PHYSICAL EXAMINATION:  GENERAL:  On exam. the patient is in no distress.  VITAL SIGNS:  Blood pressure is 145/98, pulse is 66 and regular, weight  311.  LUNGS:  No rales or wheezes.  CARDIAC:  Regular rate and rhythm.  S1 and S2.  No definite murmurs.  ABDOMEN:  Obese, benign.  EXTREMITIES:  Trace to 1+ edema bilaterally.   IMPRESSION:  1. History of coronary artery disease, status post cath in 2005 with      coronary artery bypass graft x4 at that time (left internal mammary      artery to left anterior descending; saphenous vein graft to first      obtuse marginal artery and second obtuse marginal artery; saphenous      vein graft to right coronary artery).  I am not convinced of any      active ischemia on examination.  I would continue.  2. Hypertension, not optimally controlled, but I am confused about his      medicines.  I will be in touch with his primary doctor to few why      certain changes were made.  3. Dyslipidemia.  Again, he is not on a statin, but on Niaspan, not      sure how its happened.  Again I would like to get the  old records.  4. Gout.  Symptoms are a little different, again need to review      outside records.   I am not going to make any changes today.  I told the patient I will be  back in touch with him once I have talked to his primary physician.     Fay Records, MD, [facility]  Electronically Signed    PVR/MedQ  DD: 12/01/2007  DT: 12/02/2007  Job #: [id number]

## 2010-07-15 NOTE — H&P (Signed)
NAMECOLESON, SHOPE NO.:  [id number]   MEDICAL RECORD NO.:  [id number]          PATIENT TYPE:  EMS   LOCATION:  MAJO                         FACILITY:  [facility]   PHYSICIAN:  Domingo Mend, M.D. DATE OF BIRTH:  1940/08/21   DATE OF ADMISSION:  10/08/2008  DATE OF DISCHARGE:                              HISTORY & PHYSICAL   PRIMARY CARE PHYSICIAN:  Dr. Marlou Sa.   CHIEF COMPLAINT:  Fever.   HISTORY OF PRESENT ILLNESS:  Mr. Torain is a very pleasant 70 year old  African American man with known history for coronary artery disease,  hypertension and hyperlipidemia, who was getting ready for church  yesterday when he noticed that he was feverish with chills and rigors.  When he checked his temperature it was noted to be 104.5.  He continued  to be feverish all throughout his church service and became concerned  when the fever did not abate.  He later noticed that his left leg was  red and more swollen than usual.  At the insistence of his wife, he came  into the hospital to be further evaluated.  Of note, he denies any  headaches, shortness of breath, chest pain, nausea, vomiting, abdominal  pain or diarrhea.   ALLERGIES:  NO KNOWN DRUG ALLERGIES.   PAST MEDICAL HISTORY:  1. Coronary artery disease, status post a cath in 2005, with a      quadruple vessel bypass.  2. Hypertension.  3. Hyperlipidemia.  4. Questionable history of obstructive sleep apnea, but he does not      use a CPAP machine at night.   HOME MEDICATIONS:  1. Niaspan 500 mg daily.  2. Lasix 20 mg twice daily.  3. Benicar 40 mg daily.  4. Metoprolol 25 mg twice daily.  5. Aspirin 81 mg daily.  6. An unknown cholesterol pill.   SOCIAL HISTORY:  He used to smoke cigars, quit in 2005, when he had his  bypass surgery, none since. He denies any alcohol or illicit drug use.  He is married to his [name].   FAMILY HISTORY:  Significant for coronary artery disease in multiple  family members.   REVIEW OF SYSTEMS:  Negative except as already mentioned in the HPI.   PHYSICAL EXAMINATION:  VITAL SIGNS:  Upon admission, blood pressure  137/80, heart rate 138, respirations 18.  O2 saturations 95% on room air  with a temperature of 103.5 upon arrival to the emergency department.  GENERAL:  He is alert, awake and oriented x3.  He does not appear to be  in any distress, although he is diaphoretic.  Covered in many blankets.  HEENT:  Normocephalic, atraumatic.  His pupils are equally reactive to  light and accommodation with intact extraocular movements.  NECK:  Supple.  No JVD, no lymphadenopathy, no bruits or goiter.  HEART:  Regular rate and rhythm with no murmurs, rubs or gallops.  LUNGS:  Clear to auscultation bilaterally.  ABDOMEN:  Soft, nontender, nondistended with positive bowel sounds.  NEUROLOGIC:  Grossly intact and nonfocal.  EXTREMITIES:  He has left greater than right lower extremity  edema,  right left 3+ edema, right 2+ edema.  His left lower extremity from the  ankle all the way up to his knee is erythematous and warm to touch as  well.   LABORATORY DATA:  Upon admission, sodium 139, potassium 4.2, chloride  105, bicarb 23, BUN 22, creatinine 1.4 with a glucose of 117.  WBCs 16.3  with an ANC of 14.3, hemoglobin of 13.0 and a platelet count of 232.  Urinalysis that is negative and a set of point of care markers that is  negative.  A chest x-ray that showed no acute disease.  Left tib-fib x-  ray that shows no acute bony findings.  The patient also had an EKG in  the emergency department that showed a sinus rhythm with a rate of about  110, with normal axis, maybe some left atrial enlargement, but no acute  ST or T-wave abnormalities.   ASSESSMENT AND PLAN:  1. For his left lower extremity cellulitis and fever, we will check      blood cultures x2.  We will start him on IV vancomycin and Zosyn.      We will also consider an MRI if there is no improvement after 24-48       hours of IV antibiotics.  There is no wound to culture at this      time.  2. For his hypertension which is well-controlled at this time, we will      continue his Benicar and metoprolol.  3. For his hyperlipidemia, we will check a fasting lipid profile.  He      is on Niaspan.  Right now, I do not have the name of his other      cholesterol medication, although with his coronary artery disease,      he would most certainly benefit from a statin.  Hence, I will start      him on simvastatin 20 mg daily and will check a CMET to follow his      liver enzymes in the morning.  4. For prophylaxis while in the hospital, he will be on Protonix for      GI prophylaxis and Lovenox for DVT prophylaxis.      Domingo Mend, M.D.  Electronically Signed     EH/MEDQ  D:  10/09/2008  T:  10/09/2008  Job:  [id number]

## 2010-07-15 NOTE — Discharge Summary (Signed)
NAMESIGIFREDO, LOCICERO                 ACCOUNT NO.:  [id number]   MEDICAL RECORD NO.:  [id number]          PATIENT TYPE:  INP   LOCATION:  [id number]                         FACILITY:  [facility]   PHYSICIAN:  Jacquelynn Cree, M.D.   DATE OF BIRTH:  March 09, 1940   DATE OF ADMISSION:  10/08/2008  DATE OF DISCHARGE:  10/12/2008                               DISCHARGE SUMMARY   PRIMARY CARE PHYSICIAN:  Dr. Azzie Roup.   DISCHARGE DIAGNOSES:  1. Left lower extremity cellulitis.  2. Group G streptococcus bacteremia.  3. Hypertension.  4. Dyslipidemia.  5. Stage III chronic kidney disease.  6. Constipation.   DISCHARGE MEDICATIONS:  1. Niaspan 500 mg p.o. daily.  2. Lasix 20 mg p.o. b.i.d.  3. Benicar 40 mg p.o. daily.  4. Metoprolol increased to 75 mg p.o. b.i.d.  5. Aspirin 81 mg daily.  6. Keflex 500 mg p.o. b.i.d. x10 more days.  7. Doxycycline 100 mg p.o. b.i.d. x10 more days.   CONSULTATIONS:  None.   BRIEF ADMISSION HISTORY OF PRESENT ILLNESS:  The patient is a 70-year-  old male, who presented to the hospital with a chief complaint of  fevers.  He took his temperature at home and noted to be 104.5 degrees.  He later noticed that his left leg was red and more swollen than usual.  He subsequently presented to the hospital for evaluation and was  referred to the Hospitalist Service for further treatment.   PROCEDURES AND DIAGNOSTIC STUDIES:  1. Chest x-ray on October 08, 2008, showed no focal acute findings.  2. Left tibia-fibula films on October 08, 2008, showed no acute bony      findings.  3. Left lower extremity Doppler studies on October 09, 2008, showed no      evidence of DVT or superficial thrombosis.   DISCHARGE LABORATORY VALUES:  Sodium was 135, potassium 4.1, chloride  103, bicarb 26, BUN 14, creatinine 1.32, and glucose 98.  White blood  cell count was 11.8, hemoglobin 11.8, hematocrit 34.9, and platelets  212.  Thyroid studies were normal.  Lipids showed a cholesterol of 121,  triglycerides 71, HDL 35, and LDL 72.   HOSPITAL COURSE:  1. Left lower extremity cellulitis with group G strep bacteremia.  The      patient was admitted, and blood cultures ultimately grew one set      positive for group G strep bacteremia.  The patient was empirically      put on vancomycin and Zosyn on admission and has remained on these      medications throughout his hospital stay.  He will be discharged on      additional 10 days of treatment with Keflex and doxycycline to      cover both the strep and the staph.  The patient's lower extremity      skin is very dry with multiple cracks.  Instructed to keep the skin      on his feet moist with moisturizing creams.  He is instructed to      return to the hospital if he develops any  recurrence of fever.  2. Hypertension:  The patient's blood pressure was suboptimally      controlled, and we titrated his metoprolol up, better blood      pressure control.  His discharge blood pressure is 139/87.  3. Dyslipidemia:  The patient is well controlled on his home regimen.      In addition to the Niaspan, he is on another cholesterol      medication, which he could not recall.  He is instructed to resume      this after discharge as well.  4. Stage III chronic kidney disease:  The patient does have underlying      renal insufficiency consistent with a diagnosis of stage III      chronic kidney disease.  He should avoid nonsteroidal anti-      inflammatory medications.  His creatinine has improved from      admission.  His creatinine reached a high of 1.68 and his discharge      creatinine is at baseline of 1.32.  5. Constipation:  The patient was provided with MiraLax.  He can      continue laxatives as needed after discharge.   DISPOSITION:  The patient is medically stable and will be discharged to  home.   CONDITION AT DISCHARGE:  Improved.   Time spent coordinating care for discharge and discharge instructions  equals 25  minutes.      Jacquelynn Cree, M.D.  Electronically Signed     CR/MEDQ  D:  10/12/2008  T:  10/12/2008  Job:  [id number]   cc:   Azzie Roup

## 2010-07-15 NOTE — Assessment & Plan Note (Signed)
[place] OFFICE NOTE   NAME:[name], Shane                        MRN:          [id number]  DATE:07/15/2006                            DOB:          1940-09-02    IDENTIFICATION:  [name] is a 70 year old gentleman, whom I last saw  in December of last year.  He has a history of CAD, hypertension, sleep  apnea, dyslipidemia.  Note, he was previously a patient of [facility].   Since seeing him, he has done fairly well.  He is now retired.  Staying  active.  He had 1 episode of shortness of breath but it was when his  wife had some nail polish remover around.   He is doing some walking, doing yard work.   CURRENT MEDICATIONS:  1. Metoprolol 25 b.i.d.  2. Lovastatin 40 q.h.s.  3. Benicar 40 daily.  4. Aspirin 81 mg daily.   PHYSICAL EXAMINATION:  GENERAL:  The patient is in no distress.  VITAL SIGNS:  Blood pressure is 153/90.  Pulse is 60.  Weight 307.  LUNGS:  Clear.  CARDIAC:  Distant heart sounds.  S1, S2, no S3.  No murmurs.  EXTREMITIES:  2+ pulses, no edema.  NECK:  JVP is normal.  No bruits.   IMPRESSION:  1. Coronary artery disease.  Stable.  I would continue.  2. Hypertension.  Inadequate control.  I will be in touch with the      patient to switch his Benicar to Benicar/HCTZ.  We will follow up      in 8 weeks' time.  3. Dyslipidemia.  Encouraged him to lose weight, exercise.  His LDL      was good at 71, but HDL was low at 31.  We will follow up in the      winter.     Fay Records, MD, [facility]     PVR/MedQ  DD: 07/15/2006  DT: 07/15/2006  Job #: [id number]

## 2010-07-18 NOTE — Assessment & Plan Note (Signed)
[place] OFFICE NOTE   NAME:[name], [name]                        MRN:          [id number]  DATE:10/09/2005                            DOB:          [birth year]    IDENTIFICATION:  [name] is a 70 year old gentleman who was previously  followed by Dr. Dannielle [name], has a history of coronary artery disease.  I  actually last saw him back in June.   When he was seen, his blood pressure was not optimally controlled and I  recommended putting him on Hyzaar 50/12.5, stopping the hydrochlorothiazide.  In the interval, he says he has done well on this, though he ran out of it a  few days ago.  He denies chest pain.  Breathing is okay.   CURRENT MEDICATIONS:  1. Lovastatin 40 mg q.h.s.  2. Aspirin 81 mg q.day.  3. Metoprolol 25 mg b.i.d.  4. Ran out of Hyzaar 50/12.5.   PHYSICAL EXAMINATION:  GENERAL:  The patient is in no distress.  VITALS:  Blood pressure 142/96.  Pulse 71.  Weight 309.  LUNGS:  Clear.  CARDIAC EXAM:  Regular rate and rhythm.  S1, S2.  No S3.  No significant  murmurs.  ABDOMEN:  Benign, obese.  EXTREMITIES:  Trace to 1+ edema, unchanged.   IMPRESSION:  1. Hypertension.  Ran out of the Hyzaar.  I gave him a prescription and      samples for this and he will continue.  Will follow up in a few months.  2. Coronary artery disease, status post CABG in October 2005, clinically      stable.  3. Lower extremity edema, unchanged.  4. Obstructive sleep apnea, significant.  I recommended he follow up with      Dr. Gwenette [name] again.  5. History of second-degree atrial valve block.  6. History of dyslipidemia.  Talked to him about his lipids.  His HDL is      not optimally controlled and I would recommend adding Niaspan.  It is a      26.  I would recommend a Niaspan 500 mg then 1 gm q.day.  Will follow      up in ten weeks' time otherwise clinic followup in three months.  I      encouraged him to get in  touch with Dr. Gwenette [name] also for followup.                                Fay Records, MD, [facility]    PVR/MedQ  DD:  10/09/2005  DT:  10/10/2005  Job #:  [id number]

## 2010-07-18 NOTE — H&P (Signed)
[name], [name]                 ACCOUNT NO.:  [id number]   MEDICAL RECORD NO.:  [id number]          PATIENT TYPE:  EMS   LOCATION:  MINO                         FACILITY:  [facility]   PHYSICIAN:  Merri Ray. Grandville Silos, M.D.DATE OF BIRTH:  06-07-1940   DATE OF ADMISSION:  04/22/2005  DATE OF DISCHARGE:                                HISTORY & PHYSICAL   CHIEF COMPLAINT:  Pain in left medial thigh.   Patient is a 70 year old African-American male who had history of trauma of  his left thigh while moving furniture nine days ago.  Over the weekend it  became more swollen.  Today the swelling increased and he had more pain.  He  was evaluated at the [facility] Emergency Department.  Here he was found to  have a temperature of 103.1, white blood cell count elevation of 19.7 and he  was noted to have a large fluctuant indurated area on his left medial thigh.  This was aspirated by the ED physician and noted to have purulent fluid.  X-  rays were done that showed no fracture.  The patient complains of localized  pain.   PAST MEDICAL HISTORY:  1.  Coronary artery disease.  2.  Hypertension.   PAST SURGICAL HISTORY:  1.  CABG.  2.  Left inguinal hernia repair.   MEDICATIONS:  1.  Lovastatin 40 mg q.h.s.  2.  Hydrochlorothiazide 25 mg daily.  3.  Lopressor 25 mg b.i.d.   ALLERGIES:  None.   He is retired.   PHYSICAL EXAMINATION:  GENERAL:  He is awake and alert.  NECK:  Supple.  LUNGS:  Clear to auscultation.  HEART:  Regular.  ABDOMEN:  Soft and nontender.  EXTREMITIES:  Left thigh demonstrates a large indurated and erythematous,  fluctuant mass with positive calor and tenderness.  There is no frank  drainage.  This does not extend up into his scrotal or groin area and seems  to center over a previous saphenectomy scar.  SKIN:  Otherwise dry without other lesions.   IMPRESSION:  Large abscess left thigh.   PLAN:  Will take the patient to the operating room for emergent incision  and  drainage of this abscess.  We will place him on IV antibiotics and he will  need postoperative wound care.  Procedure and plan including risks and  benefits were discussed in detail with the patient and he is agreeable.      Merri Ray Grandville Silos, M.D.  Electronically Signed     BET/MEDQ  D:  04/22/2005  T:  04/23/2005  Job:  [id number]

## 2010-07-18 NOTE — H&P (Signed)
NAMESAJID, KNEIFL NO.:  [id number]   MEDICAL RECORD NO.:  [id number]                   PATIENT TYPE:  OBV   LOCATION:  [id number]                                 [facility]ACILITY[facility]  [facility]   PHYSICIAN:  Ashby Dawes. Polite, M.D.              DATE OF BIRTH:  10/06/1940   DATE OF ADMISSION:  03/17/2003  DATE OF DISCHARGE:                                HISTORY & PHYSICAL   CHIEF COMPLAINT:  Dizziness, nausea, and vomiting.   HISTORY OF PRESENT ILLNESS:  Mr. Ruberg is a 70 year old black male with a  known history of hypertension for three years only on medication x 1 day.  Also recently diagnosed with bronchitis treated with antibiotic x 1 day.  The patient presents to the ED via paramedics with a chief complaint of  dizziness, nausea, and vomiting x 1.  The patient stated that he had been  under the weather for approximately one week with productive sputum of  greenish coloration.  For that reason, he presented to his primary M.D., who  also noted that he had symptoms of low-grade fever.  Upon presenting to his  primary M.D., he was noted to have an elevated BP of systolic greater than  [id number] with a diastolic greater than [id number].  The patient was treated with  outpatient antibiotics and new blood pressure medication, atenolol, with a  diuretic, chlorthiazide.  The patient had taken his medication that night  and went to work.  He works third shift.  He tolerated it without problems.  He came home, ate an egg salad sandwich that had been in the icebox  overnight.  An hour or so after that, the patient was sitting down and  complaining of feeling very swimmy headed and weak.  He states that he had  difficulty getting out of the chair.  Those symptoms lasted for  approximately an hour.  The patient denied any chest pain and shortness of  breath.  No fever and no chills.  The patient did have one episode of nausea  and vomiting, as stated.  Because of those symptoms, the  patient presented  to the ED for further evaluation and treatment.  Paramedics arrived to find  the patient at home complaining of feeling weak, saturating 94% on room air  with a blood pressure of 218/132.  The patient was transported to the ED  without complication.   PAST MEDICAL HISTORY:  1. Hypertension diagnosed approximately three years ago, however, the     patient states that he has been off of his medication and only recently     restarted on the medication one day ago.  2. Recently diagnosed with bronchitis.  3. Obesity.  4. Chronic bilateral lower extremity edema.  5. Left eye cataract.   FAMILY HISTORY:  History of coronary artery disease.   MEDICATIONS ON ADMISSION:  1. Atenolol/chlorthiazide 50/25 mg.  2. Antibiotic, unknown  name.   The patient denies aspirin and multivitamins.   SOCIAL HISTORY:  The patient denies cigarette use, but does occasionally  smoke a cigar.  Denies alcohol.  Denies drugs.   PAST SURGICAL HISTORY:  The patient stated that he had hernia repair in the  past.   ALLERGIES:  No known drug allergies.   FAMILY HISTORY:  The patient stated that his mother has coronary artery  disease and had bypass in her 62s.  He has a brother with known coronary  artery disease who had a bypass at age 56 or 58.  That brother also had high  cholesterol.  The patient has one sister who is healthy.   PHYSICAL EXAMINATION:  GENERAL APPEARANCE:  The patient is alert and  oriented x 3 and in no apparent distress.  HEENT:  Left eyelid with obvious cataract.  No carotid bruits appreciated.  No oral lesions.  CHEST:  Clear with no rales.  CARDIOVASCULAR:  Distant heart sounds.  S1 and S2.  S3 not appreciated.  No  gallops.  No rubs.  ABDOMEN:  Obese.  No hepatosplenomegaly.  EXTREMITIES:  2-3+ edema bilaterally.  NEUROLOGIC:  Nonfocal.   LABORATORY DATA:  EKG:  Sinus bradycardia at 55, inverted T-waves, one in  AVL and V4-V6, LVH, no Q, no ST elevation.  CBC:   White count 10.9,  hemoglobin 12.9, platelets 276, neutrophil count 86.  BMET:  Sodium 136,  potassium 3.8, chloride 103, CO2 30, glucose 140, BUN 33, creatinine 1.1,  AST within normal limits, ALT within normal limits.  CK 167, MB 2.7, index  1.6, troponin I 0.02.  UA with 100 protein and rare bacteria.   ASSESSMENT AND PLAN:  1. Hypertension, rule out hypertensive urgency.  2. Dizziness, resolved.  Differential includes secondary to hypertension     versus new blood pressure medication versus reaction to new antibiotic     versus mild food poisoning.  3. Nausea and vomiting.  Differential secondary to hypertension versus mild     food poisoning.  4. Recently diagnosed bronchitis.  5. Obesity.  6. Bilateral lower extremity edema and history of dyspnea on exertion,     therefore must rule out congestive heart failure.  7. Family history of coronary artery disease.  8. Obesity.  9. Left eye cataract.   RECOMMENDATIONS:  1. Admit the patient to telemetry floor bed.  2. Will monitor for arrhythmia.  3. Will order serial cardiac enzymes to rule out ischemia.  4. Continue aspirin.  5. Continue blood pressure medication.  6. Will start an ACE inhibitor and a diuretic.  7. At this time, will hold beta blocker as this may be a component of     symptomatic bradycardia.  8. Will check lipids, a hemoglobin, and a TSH.  9. Will obtain a 2-D echocardiogram.  10.      Will continue outpatient antibiotics for presumed bronchitis.  11.      Will check chest x-ray.  12.      Will make further recommendations after review of the above     studies.                                              Ashby Dawes. Polite, M.D.   RDP/MEDQ  D:  03/17/2003  T:  03/17/2003  Job:  [id number]

## 2010-07-18 NOTE — Discharge Summary (Signed)
NAMEUILLIAM, KAZMER NO.:  [id number]   MEDICAL RECORD NO.:  [id number]          PATIENT TYPE:  INP   LOCATION:  2020                         FACILITY:  [facility]   PHYSICIAN:  Ivin Poot, M.D.  DATE OF BIRTH:  June 28, 1940   DATE OF ADMISSION:  12/03/2003  DATE OF DISCHARGE:  12/14/2003                                 DISCHARGE SUMMARY   ADDENDUM:  Initially, it was anticipated that Mr. Hagstrom would be discharged home on  December 12, 2003, however, during morning rounds it was noted that his heart  rate was up to 135 overnight.  His blood pressure was also elevated at  175/95.  Labs also showed decreased hemoglobin and hematocrit of 8.4 and  24.5 respectively.  Based on these findings, it was felt that he should be  admitted at least one more day for further observation.  His blood pressure  was also increased to 50 mg twice a day and he was started on Niferex 150 mg  twice a day.   The following day, although Mr. Treto blood pressure and heart rate were  slightly better, he was noted to have two brief episodes of 2 to 1 heart  block overnight with the rate decreased down to about 40.  He had also  spiked a temperature of 101.6.  Based on these findings, his Lopressor was  again decreased to 25 mg b.i.d.  Labs were also ordered which showed further  decrease of his hemoglobin to 8.  He was then transfused with 1 unit of  packed red blood cells.  Due to his fever, blood cultures were drawn which,  prior to discharge, were negative.  Urinalysis was also negative.  Chest x-  ray showed stable bibasilar atelectasis and bibasilar effusions.  As further  workup, left lower extremity ultrasound was ordered as he had moderate  peripheral edema.  This was negative for DVT, superficial thrombosis, and  Baker's cyst.  Cardiology was also requested to further evaluate his  transient heart block.  They recommended performing overnight continuous  pulse ox as sleep apnea is  often associated with second degree AV block.  This was ordered as requested.   On the following day, December 14, 2003, Mr. Nienow's oxygen saturations had  been fine overnight.  His heart rate had also improved after his transfusion  and was now 98-100.  There were no further episodes of heart block.  His  blood pressure also showed some improvement at 146/88.  His temperature  remained slightly elevated at 99.5.  He had previously been on digoxin which  had been discontinued from a brief sinus rest over the previous weekend.  There has been no further episodes since the discontinuation of the digoxin.  Other issues addressed prior to discharge included small left thigh wound  probably from the distal portion of his left thigh venectomy incision.  The  wounds were fairly superficial with pink granulation tissue.  However, it  was felt deep enough to require wet to dry dressing changes.  He was also  started on a course of  Keflex.  His wounds stayed stable throughout the  remainder of his hospitalization.  On October 14, 2003, he was felt stable  for discharge home.  Home Health nursing had been arranged to assist with  the dressing changes of his left thigh wound.   LABORATORY DATA:  Labs on December 14, 2003, showed a white blood cell count  14.1, hemoglobin 9.7, hematocrit 28.2, platelet count 474.  Sodium 137,  potassium 5.2, blood glucose 100, BUN 23, creatinine 1.5, calcium 9.3.   DISCHARGE MEDICATIONS:  Enteric coated aspirin 325 mg 1 p.o. daily,  Lopressor 25 mg p.o. b.i.d., Altace 7.5 mg p.o. b.i.d., Zocor 20 mg p.o.  daily, Lasix 40 mg 1 p.o. daily x 5 days, K-Dur 10 mEq 1 p.o. daily  x 5  days, Keflex 500 mg p.o. q.i.d. until December 20, 2003, Niferex 150 mg 1  p.o. b.i.d. x one month.   DISCHARGE INSTRUCTIONS:  As previously dictated.       AWZ/MEDQ  D:  12/14/2003  T:  12/14/2003  Job:  [id number]   cc:   Eden Lathe. Einar Gip, [place] [place]  [place]  [address]  Fax: [phone]

## 2010-07-18 NOTE — Op Note (Signed)
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   PROCEDURE:  Coronary artery bypass grafting x 4 (left internal mammary  artery to left anterior descending artery, sequential saphenous vein graft  to obtuse marginal 1 and obtuse marginal 2, saphenous vein graft to distal  right coronary artery).   SURGEON:  Ivin Poot, M.D.   ASSISTANT:  Hoyle Sauer A. Dub Mikes, P.A.-C.   ANESTHESIA:  General by Midge Minium, M.D.   INDICATIONS FOR PROCEDURE:  The patient is a 70 year old black male who  presented to the emergency department with unstable angina.  Cardiac  catheterization demonstrated an 80% left main stenosis with an 80% stenosis  of the mid-right coronary.  His LV-function was preserved.  He had a balloon  pump placed in the cath lab for chest pain.   Prior to surgery, I examined the patient in his CCU room and reviewed the  results of the cardiac catheterization with the patient and his wife.  I  discussed the indications and expected benefits of coronary bypass surgery  for treatment of his coronary artery disease. I reviewed with him the  alteratives to surgical therapy and the expected outcome of these  alternative therapies.  I discussed with the patient the major aspects of  the proposed procedure including the location of the surgical  incisions, the  choice of conduit for grafting, the use of general anesthesia and  cardiopulmonary bypass and the expected postoperative hospital recovery. I  reviewed with the patient and wife the risks to Mr. Oust of having coronary  bypass surgery including those risks of MI, CVA, bleeding, infection and  death.  He understood these implications for the surgery and agreed to  proceed with the operation as planned, under what I felt was an informed  consent.   OPERATIVE FINDINGS:  The patient's body habitus made exposure of the aorta  and posterior left ventricle difficult.  He has poor quality saphenous vein  and vein was harvested from the left thigh, but had areas of severe  varicosities.  He also has, by physical exam, varicosities of the right leg.  The mammary artery was a good vessel with excellent flow.  The left  ventricle had no areas of focal scarring or fibrosis.  The patient received  no blood transfusion units in the operating room.  The Aprotinin protocol  was used for this operation.   DESCRIPTION OF PROCEDURE:  The patient was brought to the operating room and  placed supine on the operating table where general anesthesia was induced  under invasive hemodynamic monitoring.  The patient had a balloon pump  placed in the cath lab and this was monitored as well.  The chest, abdomen  and legs were prepped with Betadine and draped as a sterile field. A sternal  incision was made as the saphenous vein was harvested from the left thigh  using endoscopic technique, and then an additional open incision technique.  The left IMA was harvested as a pedicle graft from its origin at the  subclavian vessels. It was a good vessel with excellent flow. Heparin was  administered and ACT was documented as being therapeutic.   The pericardium was opened and suspended. Three pursestrings placed in the  ascending aorta and right atrium. The patient was cannulated and placed on  bypass.   Cardioplegic catheters were placed for both antegrade aortic and  retrograde coronary sinus cardioplegia.   The coronaries were identified for grafting and the mammary artery and vein  grafts were prepared for the distal anastomoses. The patient was cooled to  32 degrees.  An aortic cross-clamp was applied and 800 cc of cold blood  cardioplegia was delivered in split doses between the antegrade aortic and  retrograde coronary sinus catheters. There was good cardioplegic arrest with  septal temperature dropping less than 12 degrees.  Topical iced saline was  used to augment myocardial preservation.   The distal coronary anastomoses was then performed.  First distal  anastomosis was to the distal right.  This was a very thickened vessel with  a proximal 80% stenosis.  A reverse saphenous vein which was dilated and  thin in the areas was sewn end-to-side with running 7-0 Prolene with good  flow through the graft.  The second distal anastomosis and  the distal third  distal anastomosis consisted of a sequential vein graft from the OM1 to the  OM2.  The OM1 was 1.5 mm vessel, proximal 80% stenosis.  A side-to-side  anastomosis of the vein was sewn using running 7-0 Prolene. Third distal  anastomosis was the continuation of the sequential vein graft to the OM2.  This was a 1.75 mm vessel, proximal 80% stenosis.  The end of the vein was  sewn end-to-side with running  8-0 Prolene. There was good flow through the graft.  Cardioplegia was re-  dosed.   The fourth distal anastomosis was the distal aspect of the LAD.  The LAD had  diffuse disease.  Left IMA pedicle was brought through an opening created in  the left lateral pericardium, was brought down onto the LAD and sewn end-to-  side with running 8-0 Prolene. There was good flow through the anastomosis  after briefly releasing the atraumatic, vascular bull-dog clamp on the  pedicle.  The pedicle was then secured to the epicardium.  Cardioplegia was re-dosed.   While the cross-clamp was still in place, 2 proximal vein anastomoses were  performed using a 4-4 mm punch and running 6-0 Prolene.  Air was vented from  the left side of the heart and the coronaries with a dose of retrograde and  warm blood cardioplegia.  The proximal anastomoses were tied down and the  aortic cross-clamp was removed.   The heart was cardioverted back to a regular rhythm.  Temporary pacing wires  were applied.  The lungs were expanded and the ventilator was resumed.  The  patient was warmed to 37 degrees. The patient was weaned from bypass without  difficulty. Cardiac output and blood pressure were stable.  Protamine was  administered without adverse reaction.   The cannulas were removed. The mediastinum was irrigated with warm,  antibiotic irrigation. The proximal and distal anastomoses were checked  and found to be hemostatic. The left leg incision was irrigated and closed  in a standard fashion. The superior pericardium was closed over the aortic  vein grafts.  Two mediastinal and a left pleural chest tube were placed and  brought out through separate incisions. The sternum was reapproximated with  interrupted steel wire. The pectoralis fascia was closed with running #1  Vicryl. The subcutaneous,  skin and dermis were closed with running Vicryl.  Total bypass time was 140 minutes with cross-clamp time of 95 minutes.       PV/MEDQ  D:  12/05/2003  T:  12/05/2003  Job:  [id number]   cc:   [facility] office   [facility]

## 2010-07-18 NOTE — Op Note (Signed)
NAMEABSALOM, MACKLEY                 ACCOUNT NO.:  [id number]   MEDICAL RECORD NO.:  [id number]          PATIENT TYPE:  INP   LOCATION:  2550                         FACILITY:  [facility]   PHYSICIAN:  Merri Ray. Grandville Silos, M.D.DATE OF BIRTH:  Jul 02, 1940   DATE OF PROCEDURE:  DATE OF DISCHARGE:                                 OPERATIVE REPORT   PREOPERATIVE DIAGNOSIS:  Large abscess left thigh.   POSTOPERATIVE DIAGNOSIS:  Large abscess left thigh.   PROCEDURE:  Incision and drainage of abscess left thigh.   SURGEON:  Merri Ray. Grandville Silos, M.D.   ANESTHESIA:  General.   HISTORY OF PRESENT ILLNESS:  The patient is a 70 year old African-American  male who, nine days ago, had some trauma to his left medial thigh while  moving fracture.  It became more swollen over the weekend and the swelling  worsened and he developed more pain.  Today he was evaluated in the [facility] Emergency Department.  He was found to have fever to 103.1 and elevated  white blood cell count to 19.7 with signs and symptoms of a large abscess of  his left medial thigh.  He received intravenous antibiotics and he is  brought emergently to the operating room for incision and drainage of this  abscess   PROCEDURE IN DETAIL:  Informed consent was obtained.  The patient had  received intravenous antibiotics as above.  He was identified and brought to  the operating room.  General anesthesia was administered.  His left thigh  was prepped and draped in sterile fashion.  An incision was made along his  previous appendectomy scar.  Subcutaneous tissues were dissected down  entering a large pus filled cavity.  This was evacuated, anaerobic and  aerobic cultures were sent.  The remainder of the pus was evacuated.  The  cavity was about the size of an orange.  This was thoroughly irrigated,  meticulous hemostasis was then obtained with Bovie cautery.  The wound was  further irrigated and the irrigation fluid returned clear.  The  wound was  then packed with a wet to dry Kerlix dressing and sterile bandage was  applied.  Instrument counts were correct.  The patient tolerated the  procedure well without apparent complication and was taken recovery in  stable condition.      Merri Ray Grandville Silos, M.D.  Electronically Signed     BET/MEDQ  D:  04/22/2005  T:  04/23/2005  Job:  [id number]

## 2010-07-18 NOTE — Procedures (Signed)
[name], [name] NO.:  [id number]   MEDICAL RECORD NO.:  [id number]          PATIENT TYPE:  OUT   LOCATION:  SLEEP CENTER                 [facility]ACILITY:  [facility]   PHYSICIAN:  Danton Sewer, M.D. [facility] DATE OF BIRTH:  01/11/1941   DATE OF STUDY:  04/21/2004                              NOCTURNAL POLYSOMNOGRAM   REFERRING PHYSICIAN:  Ernestine Mcmurray, MD   INDICATION FOR STUDY:  Hypersomnia with sleep apnea.  Epworth score was 9.   SLEEP ARCHITECTURE:  The patient has total sleep time of 415 minutes with  sleep efficiency of 81%. There was decreased REM and slow wave sleep. Sleep  onset latency was prolonged at 58 minutes, and REM onset was normal.   IMPRESSION:  1.  Split-night study reveals severe obstructive sleep apnea/hypopnea      syndrome with 229 obstructive events noted in the first 141 minutes of      sleep. This gave the patient a respiratory disturbance index of 98      events per hour and O2 desaturation as low as 73%. Obstructive events      were not positional but were associated with moderate to loud snoring.      By protocol, the patient was placed on a medium Respironics nasal mask      and titrated to 15 cm of pressure with what appears to be excellent      control of obstructive events even through supine REM.  2.  No clinically significant cardiac arrhythmias.  3.  Small to moderate numbers of leg jerks with little sleep disruption.      KC/MEDQ  D:  04/25/2004 11:32:29  T:  04/26/2004 14:50:56  Job:  [id number]

## 2010-07-18 NOTE — Assessment & Plan Note (Signed)
[facility] OFFICE NOTE   NAME:FOUSTNyeem, Cornett                        MRN:          [id number]  DATE:02/12/2006                            DOB:          [birth year]    IDENTIFICATION:  Mr. Balliett is a 70 year old gentleman, history of CAD,  hypertension, sleep apnea, dyslipidemia, last seen in cardiology clinic  back in August.  He previously followed with Terald Sleeper.   When I saw him last, I gave him a prescription for Hyzaar.  He does not  bring his medicines today.  He says he is taking things.  He says he is  also taking the Niaspan at night.   He is using the C-PAP.  He was seen in pulmonary, cannot tell when, said  it was recent.   CURRENT MEDICATIONS:  1. Metoprolol 25 b.i.d. by report.  2. Lovastatin 40 q.h.s.  3. Aspirin 325 daily.   The patient does not know the rest of the medicines.   PHYSICAL EXAMINATION:  The patient is in no distress.  Blood pressure is  159/99, on my check 140/90, pulse is 57 and regular, weight 308, stable  from last visit.  LUNGS:  Clear, no rales.  CARDIAC EXAM:  Regular rate and rhythm, S1, S2, not S3.  No murmurs.  EXTREMITIES:  No edema.   IMPRESSION:  1. Hypertension:  We got in touch with the daughter and actually he is      on Benicar 91, as well as Lasix 20 daily.  Would continue to      follow.  Encouraged him to increase his activity to try to pull his      weight down.  I will not change for now.  2. CAD, status post CABG, 2005:  Clinically stable.  3. Sleep apnea:  Continue C-PAP, encourage weight-loss.  4. Dyslipidemia:  He is on the Niaspan, I think.  Again, will need to      continue, if he is.  Also on Lovastatin.  Will need to follow up      his fasting lipids.  He did not eat today.  Again, confirm      medicine.  5. I will set followup for later in the spring.  Again, we will talk      to him about his medicines.     Fay Records, MD, [facility]  Electronically Signed    PVR/MedQ  DD: 02/12/2006  DT: 02/12/2006  Job #: [id number]

## 2010-07-18 NOTE — H&P (Signed)
[name], [name] NO.:  [id number]   MEDICAL RECORD NO.:  [id number]          PATIENT TYPE:  INP   LOCATION:  1852                         FACILITY:  [facility]   PHYSICIAN:  [name], M.D. LHCDATE OF BIRTH:  [birth year]   DATE OF ADMISSION:  12/03/2003  DATE OF DISCHARGE:                                HISTORY & PHYSICAL   PRIMARY CARE PHYSICIAN:  [name], P.A. [facility] in  [place].   CHIEF COMPLAINT:  Chest pain.   HISTORY OF PRESENT ILLNESS:  [name] is a very pleasant 70 year old  African American male with a history of hypertension, hypercholesterolemia.  He has no history of documented coronary artery disease.  He was  hospitalized in January of 2005 with hypertensive urgency.  At that time, no  2-D echocardiogram showed no significant valvular dysfunction and EF of 50%.  He was treated with ACE inhibitor and hydrochlorothiazide.  He did have some  bradycardia with beta blocker and these were discontinued.   The patient presented to [name], P.A., today with for follow up  on his blood pressure.  He noted some exertional chest discomfort.  An EKG  was obtained, this was abnormal, and he was referred to the emergency room  for further evaluation and therapy.   The patient notes a one to two-month history of exertional chest discomfort.  This comes on with walking and will promptly go away with rest.  He denies  any radiation into his arm or jaw.  He denies any associated shortness of  breath, nausea, or diaphoresis.  He denies any history of orthopnea,  paroxysm nocturnal dyspnea, or syncope.  He does have a history of lower  extremity edema and it has been present for the last several months.  He had  stated to note this around the time he was admitted with his hypertensive  urgency.   PAST MEDICAL HISTORY:  1.  Hypertension.  2.  Treated dyslipidemia.  3.  The patient denies any history of diabetes mellitus.  As  noted above, he      was hospitalized for hypertensive urgency in January of 2005.   MEDICATIONS:  1.  Altace 5 mg b.i.d.  2.  Zocor 20 mg q.h.s.   ALLERGIES:  He broke out in hives with blood pressure pill but he cannot  remember the name.   SOCIAL HISTORY:  He lives with his wife.  He works at the post [employer], international aid for [employer].  He smokes an occasional cigar.  He denies any  alcohol or illicit drug abuse.   FAMILY HISTORY:  Significant for coronary artery disease.  His brother had  bypass in his 32s and his mother had bypass in her 35s.   REVIEW OF SYMPTOMS:  Please see HPI.  Denies any melena, hematochezia,  hematuria, dysuria, or cough.  The rest of the review of systems are  negative.   PHYSICAL EXAMINATION:  GENERAL:  He is a well-developed, well-nourished in  no distress.  VITAL SIGNS:  Blood pressure 164/87, pulse 79, respirations 20.  Oxygen  saturation 90% on room air.  Temperature 98.8.  HEENT:  Head normocephalic and atraumatic.  Eyes have pupils are equal,  round, and reactive to light and accommodation.  EOMI.  Sclerae are clear.  NECK:  Without lymphadenopathy and without thyromegaly.  Without carotid  bruits bilaterally.  PULSES:  Femoral pulses are 2+ bilaterally without bruits.  CARDIOVASCULAR:  Normal S1 and S2.  Regular rate and rhythm without  appreciable murmurs.  LUNGS:  Clear to auscultation.  ABDOMEN:  Soft and nontender without hepatomegaly.  EXTREMITIES:  There is 2+ tight edema bilaterally.  Calves are soft and  nontender.  NEUROLOGICAL:  Nonfocal.   LABORATORY DATA:  Electrocardiogram reveals sinus rhythm with a heart rate  of 78, normal axis, LVH with repolarization abnormality.   IMPRESSION:  1.  New onset of exertional angina.  2.  Hypertension.  3.  Treated hypercholesterolemia.  4.  History of bradycardia with beta blockers.  5.  Ejection fraction 50%.  6.  Family history of coronary artery disease.  7.  Remote tobacco  abuse.  8.  Lower extremity edema-r/o right heart failure   PLAN:  The patient was also seen and reviewed in exam by [name]. DeGent  today.  We plan to further evaluate the patient's chest discomfort with a  cardiac catheterization tomorrow.  We will perform right and left heart  catheterization given his significant lower extremity edema and LVH changes  on electrocardiogram.  We will rule the patient out with cardiac enzymes.  We will place him on heparin and aspirin.  We will discontinue his Altace.  He has noted significant cough since starting Altace.  In its place, we will  place him on verapamil 80 mg three times a day.  We will watch for  bradycardia.  We will also place the patient on Lasix 40 mg q.d. and  potassium 20 mEq q.d.  We will continue him on his Zocor.  We will check  lipids, TSH.  We will also check chest x-ray.  Risks and benefits of cardiac  catheterization were discussed with the patient.  He agrees to proceed.      Scot   SW/MEDQ  D:  12/03/2003  T:  12/03/2003  Job:  [id number]   cc:   [name], M.D. [facility]   [name], M.D.

## 2010-07-18 NOTE — Cardiovascular Report (Signed)
NAMEBRYLIN, HANAUER NO.:  [id number]   MEDICAL RECORD NO.:  [id number]          PATIENT TYPE:  INP   LOCATION:  3707                         FACILITY:  [facility]   PHYSICIAN:  Satira Sark, M.D. LHCDATE OF BIRTH:  [birth year]   DATE OF PROCEDURE:  12/04/2003  DATE OF DISCHARGE:                              CARDIAC CATHETERIZATION   INDICATIONS FOR PROCEDURE:  Mr. [name] is a 70 year old male with a history  of obesity, hypertension, and dyslipidemia.  He was recently admitted to the  hospital with a history of progressive exertional chest pain as well as  hypertension and has ruled out for myocardial infarction.  He is referred  for left and right heart catheterization to assess his coronary anatomy and  hemodynamics.   PROCEDURE PERFORMED:  1.  Left heart catheterization.  2.  Right heart catheterization.  3.  Selective coronary angiography.  4.  Left ventriculography.  5.  Placement of intra-aortic balloon pump.   DESCRIPTION OF PROCEDURE:  The area about the right femoral artery and vein  was anesthetized with 1% lidocaine.  A 6 French sheath was placed in the  right femoral artery and an 8 French sheath was placed in the right femoral  vein, both via the modified Seldinger technique.  A standard 7.5 French  balloon tip flow directed catheter was used for right heart catheterization  and hemodynamic assessment.  A standard preformed 6 [place] and JR4  catheters were used for selective coronary angiography and non-selective  angiography of the left internal mammary artery.  An angled pigtail catheter  was used for left heart catheterization and left ventriculography.  Exchanges were made over a wire and there were no immediate procedural  complications.   After findings of significant left main coronary artery disease and the  presence of chest discomfort during the study despite medical therapy, an  intra-aortic balloon pump was also placed in  standard fashion over the wire  with good augmentation and subsequent resolution of chest discomfort.   HEMODYNAMICS:  Right atrial mean of 11 mmHg.  Right ventricle 37/8 mmHg.  Pulmonary artery 27/16 mmHg.  Pulmonary capillary wedge pressure mean of 15  mmHg.  Left ventricle 135/18 mmHg.  Aorta 131/75 mmHg.  Cardiac output 8.2  by the thick method.  Cardiac index 3.2 by the thick method.  Arterial  saturation 98%.  Pulmonary artery saturation 76%.   ANGIOGRAPHIC FINDINGS:  1.  The left main coronary artery is diffusely diseased, approximately 30%      in the proximal portion of the vessel with a more focal 80% stenosis      distally.  Engagement of the left main coronary artery did lead to some      damping and ventricularization.   1.  The left anterior descending coronary artery is a medium to large      caliber vessel that is diffusely diseased.  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      The first diagonal branch has a 60%  ostial stenosis.  There are 20-30%      diffuse stenoses throughout the left anterior descending with an apical      portion of the vessel that is diffusely diseased at 90%.   1.  The circumflex coronary artery is a relatively large vessel that also      has mild aneurysmal dilatation in the proximal portion of the vessel.      There are two obtuse marginal branches, the first of which has a 50%      proximal stenosis and the second of which has a 40% proximal stenosis.   1.  The right coronary artery is a medium caliber vessel that is dominant      with a 50% mid vessel stenosis and a 30-40% distal vessel stenosis.   1.  The left internal mammary artery was visualized nonselectively and found      to be large with reasonable run off.   1.  Left ventriculography was performed in the RAO projection and revealed      relatively hyperdynamic left ventricular contraction with an estimated       ejection fraction at approximately 70%.  No significant mitral      regurgitation was noted.   DIAGNOSIS:  1.  Severe left main coronary artery disease with residual moderate coronary      artery disease as outlined.  2.  Normal pulmonary artery pressures with a mean pulmonary capillary wedge      pressure of 15 mmHg corresponding fairly well with a left ventricular      end diastolic pressure of 18 mmHg.  Cardiac output is normal.  3.  Left ventricular ejection fraction estimated at 70% with no major mitral      regurgitation.  No evidence of significant gradient based on aortic      valve pull back measurements.   RECOMMENDATIONS:  The patient was stabilized in the cardiac catheterization  lab with a combination of medical therapy and intra-aortic balloon pump.  He  is being transferred to the coronary care unit chest pain free and is to  have a CVTS consult for coronary artery bypass grafting.       SGM/MEDQ  D:  12/04/2003  T:  12/04/2003  Job:  [id number]

## 2010-07-18 NOTE — Discharge Summary (Signed)
NAMERODRIQUES, THEROUX NO.:  [id number]   MEDICAL RECORD NO.:  [id number]          PATIENT TYPE:  INP   LOCATION:  2020                         FACILITY:  [facility]   PHYSICIAN:  Ivin Poot, M.D.  DATE OF BIRTH:  [birth year]   DATE OF ADMISSION:  12/03/2003  DATE OF DISCHARGE:  12/12/2003                                 DISCHARGE SUMMARY   ADMISSION DIAGNOSIS:  New onset of exertional angina.   DISCHARGE DIAGNOSES:  1.  Severe left main coronary artery disease and residual moderate coronary      artery disease.  2.  Status post coronary artery bypass grafting x 4, completed on December 05, 2003 by Dr. Tharon Aquas Trigt.  3.  Hypertension with a history of hypertensive emergency in January of      2005.  4.  Hyperlipidemia.  5.  History of bradycardia secondary to beta blockers.  6.  Borderline renal insufficiency with a preoperative creatinine of 1.5.   PROCEDURE:  1.  Cardiac catheterization completed on December 04, 2003 by Dr. Satira Sark.  This revealed severe left main coronary artery disease and      severe three vessel coronary artery disease.  Left ventricular ejection      fraction was estimated at 70%.  2.  Preliminary arterial evaluation for planned cardiac surgery, completed      on December 04, 2003:  This included bilateral carotid duplex scan which      revealed no significant internal carotid artery stenosis.  The patient's      lower extremities ABIs were within normal limits.  3.  Insertion of an intra-aortic balloon pump after cardiac catheterization      for myocardial perfusion.  4.  Coronary artery bypass grafting x 4 completed on December 05, 2003:  Graft      included left internal mammary artery to the left anterior descending      artery, sequential saphenous vein graft to the obtuse marginal-1 and      obtuse marginal-2, saphenous vein graft to the distal right coronary      artery.  5.  Initiation of cardiac rehabilitation  phase one.  6.  Transfusion of fresh frozen plasma secondary to postoperative      coagulopathy.   CONSULTATIONS:  1.  Cardiac rehabilitation.  2.  Case management.   HISTORY OF PRESENT ILLNESS:  Mr. Eichenauer is a pleasant 70 year old African  American male with a history of hypertension and hypercholesterolemia.  The  patient had no documented history of coronary artery disease prior to this  presentation.  Of note, the patient was hospitalized in January of 2005 with  hypertensive urgency.  At that time, a 2-D echocardiogram showed no  significant valvular dysfunction and an EF of 50%.  The patient was treated  with an ACE inhibitor and hydrochlorothiazide.   The patient recently presented to Ms. Carter-Spencer, physician assistant,  on December 03, 2003 for follow up on his blood pressure.  The patient  admitted to Ms. Carter-Spencer that  he had some exertional chest discomfort.  EKG was obtained and this was abnormal.  The patient was then referred to  the emergency room for further evaluation and therapy.   At presentation to the emergency room, the patient admitted to a one to two  month history of exertional chest discomfort.  He stated that this came on  with walking and would probably go away with rest.  The patient denied any  associated shortness of breath, nausea, or diaphoresis.  The patient  admitted to a history of lower extremity edema and stated this had been  present for several months.  The patient was seen and examined in  consultation by Dr. Ernestine Mcmurray of [facility].  It was his  impression that the patient had new onset of exertional angina with multiple  risk factors including hypertension, hypercholesterolemia, obesity, and a  family history of coronary artery disease.  The plan was made to admit the  patient for further evaluation with a cardiac catheterization.   HOSPITAL COURSE:  Problem 1:  NEW ONSET OF EXERTIONAL ANGINA:  The patient  was admitted  to the hospital on December 03, 2003 with a history of new onset  of exertional angina and multiple cardiac risk factors including  hypertension, hypercholesterolemia, and family history of coronary artery  disease.  The patient was taken to the cardiac catheterization lab and  underwent left and right heart catheterization with selective coronary  angiography on December 04, 2003.  The left main coronary artery was found to  be diffusely diseased with an approximately 30% in the proximal portion of  the vessel with a more focal 80% stenosis distally.   Engagement of the left main coronary artery did lead to some damping and  ventricularization.  Therefore, an intra-aortic balloon pump was placed  during the procedure.  The patient was also found to have severe three  vessel coronary artery disease.  The patient had left ventricular ejection  fraction estimated at 70% with no major mitral regurgitation. There was no  evidence of significant gradient based on an aortic valve pull-back  measurement.   After the cardiac catheterization, the patient was stabilized with a  combination of medical therapy and the intra-aortic balloon pump.  The  patient was transferred to the coronary care unit chest pain-free1 and in  stable condition.  A CVTS consultation was initiated secondary to the  findings of severe left main coronary disease as well as severe three vessel  coronary artery disease.   Dr. Tharon Aquas Trigt of CVTS responded to the consultation appropriately on  December 04, 2003.  The patient was seen and examined in consultation and Dr.  Tharon Aquas Trigt's impression was that indeed the patient had class IV  unstable angina with left main stenosis and severe three vessel coronary  artery disease.  Dr. Tharon Aquas Trigt felt the best option for the patient at  this point and time was to proceed with coronary artery bypass grafting to reduce the risk of further myocardial damage.  The risks,  benefits, and  alternatives of the procedure were discussed with the patient and his family  at that time.  The patient was understanding and agreed to proceed with  surgery.  In the meantime, the patient remained hemodynamically stable and  free of chest pain.  He underwent preliminary arterial evaluation with  bilateral carotid duplex exam.  This revealed no significant internal  carotid artery stenosis.  The patient also had lower extremity  ABIs  completed and these were within normal limits.   The patient was then taken to the operating room on December 05, 2003 and  underwent coronary artery bypass grafting x 4 utilizing the left internal  mammary artery to the left anterior descending artery, sequential saphenous  vein graft to the obtuse marginal-1 and obtuse marginal-2, saphenous vein  graft to the distal right coronary artery.  The greater saphenous vein was  harvested using open and bridging technique from the left thigh.  The intra-  aortic balloon pump was maintained throughout the procedure.  The patient  tolerated this procedure well and was transferred to the surgical intensive  care unit in critical but stable condition.   Postoperatively, the patient remained afebrile and hemodynamically stable.  His chest tube output was minimal.  He awoke from anesthesia neurologically  intact.  The patient was then extubated without difficulty.  While in the  intensive care unit, the patient's chest tubes were discontinued in a  stepwise fashion.  His invasive lines were also discontinued without  difficulty.  The intra-aortic balloon pump was discontinued on postoperative  day one without difficulty.  The patient was then initiated on cardiac  rehabilitation phase 1 and his activity was advanced as tolerated.  The  patient was deemed appropriate for transfer to the stepdown unit on  postoperative day two or December 07, 2003.   While on the stepdown unit, the patient continued to make  slow but steady  progress towards recovery.  He had resumed normal bowel and bladder  function.  He was tolerating a regular diet.  His pain was well controlled  with oral medication.  The patient was ambulating with assistance in the  hallway without difficulty.  His heart remained somewhat tachycardic in the  100s to 110s.  The patient's cardiac medications were titrated as deemed  appropriate.  The patient's left leg incisions were draining minimal amounts  of serous fluid and the distal portion of the most proximal incision was  opened slightly exposing primarily pink, granulation tissue.  There was no  evidence of surrounding erythema, fluctuance or warmth.  The patient was  initiated on Keflex for coverage.  The patient's sternum was stable and his  sternal incision was clean, dry and intact.  The patient was deemed  appropriate for initiation of discharge planning on postoperative day six or  December 11, 2003.  At the time of initiation of discharge planning, the patient was doing quite  well.  He was ambulating independently in the hallway.  His pain was well  controlled with oral medications.  He had remained afebrile.  His blood  pressure was stable at 110 to 140s/60s to 80s.  His heart rates was in low  100s.  His SPO2 was 97% on room air.  The patient's weight was 294 pounds  with a preoperative weight of 290 pounds.   On physical exam, the patient's heart was in a regular rate and rhythm  reading sinus tachycardia on telemetry with a rate of 102.  His lungs  revealed decreased sounds at the bases.  Otherwise, they were clear.  Abdomen was soft, nontender, and nondistended with good bowel sounds.  His  extremities showed 1 to 2+ pitting edema to the midshin.  His sternal  incision was clean, dry, and intact.  The left lower extremity incision  continued to heal with minimal serous drainage and no evidence of further  infection.   LABORATORY DATA:  The patient's lab values  were as follows:  BMET reads  sodium of 136, potassium of 4.1, chloride 103, CO2 of 28, glucose 114, BUN  27, creatinine of 1.6.  CBC reads WBC of 14.9, hemoglobin of 8.6, hematocrit  of 24.9, and platelet count 190,000.  Digoxin level on December 09, 2003 reads  less than 0.2.  BNP level from December 10, 2003 reads 325.4.   A 2-view chest x-ray completed on December 10, 2003 reveals cardiomegaly,  status post coronary artery bypass grafting.  There are small bilateral  effusions and bibasilar atelectasis.  There is no evidence of acute  pulmonary edema or pneumothorax.   DISPOSITION:  We will continue as plan with discharge on December 12, 2003  pending morning rounds and no change in the patient's clinical status.  The  patient is being discharged to home in improved and stable condition.   DISCHARGE MEDICATIONS:  1.  Aspirin 325 mg q.d.  2.  Lopressor 25 mg b.i.d.  3.  Altace 7.5 mg b.i.d.  4.  Zocor 20 mg q.d.  5.  Lasix 40 mg q.d. for 5 days.  6.  K-Dur 20 mEq q.d. for 5 days.  7.  Digoxin 0.125 mg q.d.  8.  Keflex 500 mg q.i.d. until December 20, 2003.  9.  Tylox 1-2 tablets q.4-6h. p.r.n. pain.   ACTIVITY:  The patient is to avoid driving.  He is to avoid heavy lifting or  strenuous activity.  He is to continue to walk daily.  He should continue  his breathing exercises for one more week.   DIET:  The patient is to follow low fat, low salt, heart healthy diet.   WOUND CARE:  The patient may shower.  He should wash his incisions daily  with soap and water.  He should notify the CVTS office if there are any  changes in his incisions.  The patient is to have a home health registered  nurse to check his wounds and assist with dressing changes.   SPECIAL INSTRUCTIONS:  The patient has been arranged to receive home health  registered nurse visits for postcardiac surgery protocol.  This is to be  provided by [facility].   FOLLOW UP: 1.  The patient is to see Dr. Ernestine Mcmurray of [facility] within two      weeks of discharge.  The [place] office will schedule this appointment      date and time.  2.  The patient is scheduled to see Dr. Tharon Aquas Trigt of CVTS on Friday,      January 04, 2004 at 11:45 a.m.  Otherwise, the patient is instructed to      notify the CVTS office if he has any questions or concerns in the mean      time.       CAF/MEDQ  D:  12/11/2003  T:  12/11/2003  Job:  [id number]   cc:   Len Childs, M.D.  [address]  [place] [address]   Patient's chart   Ernestine Mcmurray, M.D. [facility]   Sarah Carter-Spencer, P.A.  Primary Care in [place], [place]

## 2010-07-18 NOTE — Discharge Summary (Signed)
NAMEBOL, MASIELLO                            ACCOUNT NO.:  [id number]   MEDICAL RECORD NO.:  [id number]                   PATIENT TYPE:  INP   LOCATION:  [id number]                                 [facility]AC[facility]   PHYSICIAN:  Annita Brod, M.D.            DATE OF BIRTH:  10/06/1940   DATE OF ADMISSION:  03/17/2003  DATE OF DISCHARGE:                                 DISCHARGE SUMMARY   PRIMARY CARE PHYSICIAN:  Priscille Heidelberg. Little, M.D.   DISCHARGE DIAGNOSES:  1. Hypertensive urgency.  2. Dizziness felt to be secondary to high blood pressure as well as     bradycardia.  3. Bradycardia felt to be secondary to beta blocker.  4. Nausea and vomiting times one.  5. Recently diagnosed bronchitis.  6. Obesity.  7. Bilateral lower extremity edema.  8. 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   DISCHARGE MEDICATIONS:  Altace 10 b.i.d., hydrochlorothiazide 25 mg p.o.  daily, he was previously on a combination of atenolol and  hydrochlorothiazide, the atenolol component has been discontinued.  Zocor 20  mg p.o. q.h.s.   HISTORY OF PRESENT ILLNESS:  This is a 70 year old, African-American male  with a known history of hypertension times three years has been previously  been taking medication for one day.  He presented complaining of nausea,  vomiting, dizziness, and was found to have a blood pressure of 218/132.  The  patient was transported to the emergency department, given diuretics,  cardiac enzymes, 2D echo, lipid and TSH were all ordered and the patient was  stabilized and sent to the floor.   With regard to the patient's dizziness with bradycardia and a heart rate in  the 50s, and hypertensive urgency and systolic in the [id number], the patient's  medication of beta blocker was held.  Continued on diuretics,  hydrochlorothiazide.  He was also added Lasix as well as an ACE inhibitor.  The patient stated that he immediately started to feel better.  An A1c  was  checked which was within normal limits.  A fasting lipid profile showed a  slightly elevated LDL in the 130s.  Given the new parameters, his target  should be less than 70.  He was started on a statin which he tolerated well.  TSH was checked which was normal as well as serial cardiac enzymes which  were normal as well.  The patient's blood pressure came down and was within  a systolic of 0000000 by [id number].  A 2D echocardiogram was performed showing  no evidence of any significant valvular dysfunction and ejection fraction  was in the low end of normal at 50%.  The patient's initial lower extremity  edema improved given the diuretics he was on.  His ACE inhibitor was trended  upwards and by day of discharge, the patient was feeling stable.  He was  ambulate freely in the hallways without any problems.  His medication  changes were explained to him and he stated that he would indeed be  compliant with these medications.  He is felt to be stable for discharge on  03/20/03.  He will follow up with Dr. Hulan Fess in one week.  The patient  tells me he already has an appointment.  His discharge diet is a low sodium.  Activity is as tolerated, although I am advising him to take one day prior  to returning to work to confirm that he has  no significant weakness or dizziness.  He is told that if these symptoms  recur to return to the ER or to contact Dr. Rex Kras right away.   DISPOSITION:  Improved and he is being discharged to home.                                               Annita Brod, M.D.    SKK/MEDQ  D:  03/20/2003  T:  03/21/2003  Job:  [id number]   cc:   Lennette Bihari L. Little, M.D.  [address]  [place]  [place] [address]  Fax: [phone]

## 2010-08-07 ENCOUNTER — Other Ambulatory Visit: Payer: Self-pay | Admitting: Internal Medicine

## 2011-04-15 ENCOUNTER — Other Ambulatory Visit: Payer: Self-pay | Admitting: Internal Medicine

## 2011-04-15 NOTE — Telephone Encounter (Signed)
Left message to call back.  Viewed patients chart and could not find any recent labs (scanned in 2011 but actually done in 2010).  Not sure if patient is getting labs at PCP. If is, need copy if not he needs to come in for labs

## 2011-04-17 NOTE — Telephone Encounter (Signed)
I will forward this message to [facility] to follow-up with patient.

## 2011-12-31 ENCOUNTER — Other Ambulatory Visit: Payer: Self-pay | Admitting: Internal Medicine

## 2012-03-18 ENCOUNTER — Encounter (HOSPITAL_COMMUNITY): Payer: Self-pay

## 2012-04-26 ENCOUNTER — Other Ambulatory Visit: Payer: Self-pay | Admitting: Internal Medicine

## 2012-06-06 ENCOUNTER — Other Ambulatory Visit: Payer: Self-pay | Admitting: Internal Medicine

## 2012-07-21 ENCOUNTER — Encounter: Payer: Self-pay | Admitting: Internal Medicine

## 2012-07-21 ENCOUNTER — Ambulatory Visit (INDEPENDENT_AMBULATORY_CARE_PROVIDER_SITE_OTHER): Payer: Medicare Other | Admitting: Internal Medicine

## 2012-07-21 VITALS — BP 150/75 | HR 60 | Ht 74.0 in | Wt 303.0 lb

## 2012-07-21 DIAGNOSIS — I2510 Atherosclerotic heart disease of native coronary artery without angina pectoris: Secondary | ICD-10-CM

## 2012-07-21 NOTE — Progress Notes (Signed)
HPI HPIPatient is a 72 year old with a history of CAD, dyslpiidemia, hypertension and sleep apnea  1`  He is s/p CABG x 4 in 2005 (LIMA to LAD; SVG to OM1/OM2; SVG to RCA).I saw him in clinic 1 year ago  Since seen he has done well. No chest pains. No SOB. He is now using CPAP.  Says he is active.  Takes care of grandchildren during day (5 and 8)  Allergies  Allergen Reactions  . Ramipril Cough    Current Outpatient Prescriptions  Medication Sig Dispense Refill  . allopurinol (ZYLOPRIM) 100 MG tablet Take 100 mg by mouth daily.      [name] aspirin 81 MG tablet Take 81 mg by mouth daily.        . furosemide (LASIX) 20 MG tablet Take 20 mg by mouth 2 (two) times daily.        [name] lovastatin (MEVACOR) 20 MG tablet TAKE TWO TABLETS BY MOUTH ONCE DAILY  60 tablet  0  . metoprolol tartrate (LOPRESSOR) 25 MG tablet Take 3 by mouth two times a day       . niacin (NIASPAN) 500 MG CR tablet Take 500 mg by mouth at bedtime.        [name] olmesartan (BENICAR) 40 MG tablet Take 40 mg by mouth daily.         No current facility-administered medications for this visit.    Past Medical History  Diagnosis Date  . CAD (coronary artery disease)     s/p cath in 2005 and CABG x4  . HTN (hypertension)   . Hyperlipidemia   . OSA (obstructive sleep apnea)     AHI 98/hr  . Obesity     with reduction  . Hyperlipidemia   . H/O: gout   . DJD (degenerative joint disease)   . DJD (degenerative joint disease)   . Fluid retention     mild  . Sleep apnea   . Renal insufficiency   . Stroke 2000  . Myocardial infarction 2009  . History of blood clots     Past Surgical History  Procedure Laterality Date  . Coronary artery bypass graft    . Left inguinal hernia repair      Family History  Problem Relation Age of Onset  . Coronary artery disease      significant in multiple family members  . Gout Mother   . Stroke Mother   . Coronary artery disease Mother   . Hypertension Mother   . Arthritis Mother   .  Coronary artery disease Father   . Arthritis Father   . Gout Father   . Stroke Father   . Hypertension Father   . Coronary artery disease Brother   . Arthritis Brother   . Gout Brother   . Stroke Brother   . Hypertension Brother   . Coronary artery disease Brother   . Arthritis Brother   . Gout Brother   . Stroke Brother   . Hypertension Brother     History   Social History  . Marital Status: Married    Spouse Name: [name]    Number of Children: N/A  . Years of Education: N/A   Occupational History  . Not on file.   Social History Main Topics  . Smoking status: Former Smoker    Types: Cigars  . Smokeless tobacco: Not on file     Comment: used to smoke cigars, 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, none since  .  Alcohol Use: No  . Drug Use: No  . Sexually Active: Not on file   Other Topics Concern  . Not on file   Social History Narrative  . No narrative on file    Review of Systems:  All systems reviewed.  They are negative to the above problem except as previously stated.  Vital Signs: BP 150/75  Pulse 60  Ht 6\' 2"  (1.88 m)  Wt 303 lb (137.44 kg)  BMI 38.89 kg/m2  Physical Exam Patient in NAD HEENT:  Normocephalic, atraumatic. EOMI, PERRLA.  Neck: JVP is normal.  No bruits.  Lungs: clear to auscultation. No rales no wheezes.  Heart: Regular rate and rhythm. Normal S1, S2. No S3.   No significant murmurs. PMI not displaced.  Abdomen:  Supple, nontender. Normal bowel sounds. No masses. No hepatomegaly.  Extremities:   Good distal pulses throughout. No lower extremity edema.  Musculoskeletal :moving all extremities.  Neuro:   alert and oriented x3.  CN II-XII grossly intact.  EKG  SR 60  First degree AV block.  PR 218 msec.  Nonspecific ST T wave changes.  Assessment and Plan:  1.  CAD  No symptoms of angina.  Keep on same regimen  Stay active 2.  HTN  BP is a little high today  I would follow  Not push for now 3.  HL  Will need to get labs from Dr. Marlou Sa re  lipids 4.  Morbid obesity I encouraged him to stay active, try to get wt down.  Grad cut down on portions.  F/U in 1 year.

## 2012-07-21 NOTE — Patient Instructions (Addendum)
Your physician wants you to follow-up in: 1 year with Dr. Ross.  You will receive a reminder letter in the mail two months in advance. If you don't receive a letter, please call our office to schedule the follow-up appointment.  

## 2012-07-25 ENCOUNTER — Other Ambulatory Visit: Payer: Self-pay | Admitting: Internal Medicine

## 2012-12-15 ENCOUNTER — Other Ambulatory Visit: Payer: Self-pay

## 2012-12-15 MED ORDER — LOVASTATIN 20 MG PO TABS: 20.0000 mg | ORAL_TABLET | Freq: Two times a day (BID) | ORAL | Status: DC

## 2013-02-09 ENCOUNTER — Other Ambulatory Visit: Payer: Self-pay | Admitting: *Deleted

## 2013-02-09 MED ORDER — LOVASTATIN 40 MG PO TABS: 40.0000 mg | ORAL_TABLET | Freq: Every day | ORAL | Status: DC

## 2013-02-10 ENCOUNTER — Other Ambulatory Visit: Payer: Self-pay

## 2013-02-10 MED ORDER — LOVASTATIN 20 MG PO TABS: ORAL_TABLET | ORAL | Status: DC

## 2013-06-04 ENCOUNTER — Encounter (HOSPITAL_COMMUNITY): Payer: Self-pay | Admitting: Emergency Medicine

## 2013-06-04 ENCOUNTER — Emergency Department (HOSPITAL_COMMUNITY)
Admission: EM | Admit: 2013-06-04 | Discharge: 2013-06-04 | Disposition: A | Discharge status: Home / Self Care | Payer: Medicare Other | Attending: Emergency Medicine | Admitting: Emergency Medicine

## 2013-06-04 ENCOUNTER — Emergency Department (HOSPITAL_COMMUNITY): Payer: Medicare Other

## 2013-06-04 DIAGNOSIS — H269 Unspecified cataract: Secondary | ICD-10-CM | POA: Insufficient documentation

## 2013-06-04 DIAGNOSIS — M1990 Unspecified osteoarthritis, unspecified site: Secondary | ICD-10-CM | POA: Insufficient documentation

## 2013-06-04 DIAGNOSIS — Z7982 Long term (current) use of aspirin: Secondary | ICD-10-CM | POA: Insufficient documentation

## 2013-06-04 DIAGNOSIS — Y9389 Activity, other specified: Secondary | ICD-10-CM | POA: Insufficient documentation

## 2013-06-04 DIAGNOSIS — S0510XA Contusion of eyeball and orbital tissues, unspecified eye, initial encounter: Secondary | ICD-10-CM | POA: Insufficient documentation

## 2013-06-04 DIAGNOSIS — I2510 Atherosclerotic heart disease of native coronary artery without angina pectoris: Secondary | ICD-10-CM | POA: Insufficient documentation

## 2013-06-04 DIAGNOSIS — IMO0002 Reserved for concepts with insufficient information to code with codable children: Secondary | ICD-10-CM | POA: Insufficient documentation

## 2013-06-04 DIAGNOSIS — G4733 Obstructive sleep apnea (adult) (pediatric): Secondary | ICD-10-CM | POA: Insufficient documentation

## 2013-06-04 DIAGNOSIS — Z86718 Personal history of other venous thrombosis and embolism: Secondary | ICD-10-CM | POA: Insufficient documentation

## 2013-06-04 DIAGNOSIS — I10 Essential (primary) hypertension: Secondary | ICD-10-CM | POA: Insufficient documentation

## 2013-06-04 DIAGNOSIS — I252 Old myocardial infarction: Secondary | ICD-10-CM | POA: Insufficient documentation

## 2013-06-04 DIAGNOSIS — Z8673 Personal history of transient ischemic attack (TIA), and cerebral infarction without residual deficits: Secondary | ICD-10-CM | POA: Insufficient documentation

## 2013-06-04 DIAGNOSIS — Z87891 Personal history of nicotine dependence: Secondary | ICD-10-CM | POA: Insufficient documentation

## 2013-06-04 DIAGNOSIS — E785 Hyperlipidemia, unspecified: Secondary | ICD-10-CM | POA: Insufficient documentation

## 2013-06-04 DIAGNOSIS — Z79899 Other long term (current) drug therapy: Secondary | ICD-10-CM | POA: Insufficient documentation

## 2013-06-04 DIAGNOSIS — M109 Gout, unspecified: Secondary | ICD-10-CM | POA: Insufficient documentation

## 2013-06-04 DIAGNOSIS — Y92009 Unspecified place in unspecified non-institutional (private) residence as the place of occurrence of the external cause: Secondary | ICD-10-CM | POA: Insufficient documentation

## 2013-06-04 DIAGNOSIS — S0511XA Contusion of eyeball and orbital tissues, right eye, initial encounter: Secondary | ICD-10-CM

## 2013-06-04 DIAGNOSIS — Z87448 Personal history of other diseases of urinary system: Secondary | ICD-10-CM | POA: Insufficient documentation

## 2013-06-04 DIAGNOSIS — Z951 Presence of aortocoronary bypass graft: Secondary | ICD-10-CM | POA: Insufficient documentation

## 2013-06-04 DIAGNOSIS — E669 Obesity, unspecified: Secondary | ICD-10-CM | POA: Insufficient documentation

## 2013-06-04 HISTORY — DX: Unspecified cataract: H26.9

## 2013-06-04 NOTE — ED Provider Notes (Signed)
CSN: [id number]     Arrival date & time 06/04/13  1358 History  This chart was scribed for non-physician practitioner working with Baron Sane, by Allena Earing ED Scribe. This patient was seen in TR04C/TR04C and the patient's care was started at 2:22 PM.    Chief Complaint  Patient presents with  . Eye Injury      HPI  HPI Comments: [name] is a 73 y.o. male PMHx significant for CAD, hypertension, hyperlipidemia, OSA, obesity, hyperlipidemia, renal insufficiency, stroke, MI, cataract of left eye presents to the Emergency Department complaining of injury to the right eye that he suffered yesterday when he hit his eye on the steering wheel of his lawn mower. He reports that the mower jerked back and struck him in the right eye. Pt reports associated limited vision out of his right eye d/t small. He also states that his vision is a little blurry from baseline, but denies any total loss of vision. He normally wears glasses for reading. Denies any fevers, chills. No history of acute angle glaucoma or other emergent eye history.   Past Medical History  Diagnosis Date  . CAD (coronary artery disease)     s/p cath in 2005 and CABG x4  . HTN (hypertension)   . Hyperlipidemia   . OSA (obstructive sleep apnea)     AHI 98/hr  . Obesity     with reduction  . Hyperlipidemia   . H/O: gout   . DJD (degenerative joint disease)   . DJD (degenerative joint disease)   . Fluid retention     mild  . Sleep apnea   . Renal insufficiency   . Stroke 2000  . Myocardial infarction 2009  . History of blood clots   . Cataract    Past Surgical History  Procedure Laterality Date  . Coronary artery bypass graft    . Left inguinal hernia repair     Family History  Problem Relation Age of Onset  . Coronary artery disease      significant in multiple family members  . Gout Mother   . Stroke Mother   . Coronary artery disease Mother   . Hypertension Mother   . Arthritis Mother   .  Coronary artery disease Father   . Arthritis Father   . Gout Father   . Stroke Father   . Hypertension Father   . Coronary artery disease Brother   . Arthritis Brother   . Gout Brother   . Stroke Brother   . Hypertension Brother   . Coronary artery disease Brother   . Arthritis Brother   . Gout Brother   . Stroke Brother   . Hypertension Brother    History  Substance Use Topics  . Smoking status: Former Smoker    Types: Cigars  . Smokeless tobacco: Not on file     Comment: used to smoke cigars, 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, none since  . Alcohol Use: No    Review of Systems  Constitutional: Negative for fever.  Eyes: Visual disturbance: right eye.       R orbit swelling.   Neurological: Negative for headaches.  All other systems reviewed and are negative.      Allergies  Ramipril  Home Medications   Current Outpatient Rx  Name  Route  Sig  Dispense  Refill  . allopurinol (ZYLOPRIM) 100 MG tablet   Oral   Take 100 mg by mouth daily.         [name]  aspirin 81 MG tablet   Oral   Take 81 mg by mouth daily.           . furosemide (LASIX) 20 MG tablet   Oral   Take 20 mg by mouth 2 (two) times daily.           [name] lovastatin (MEVACOR) 20 MG tablet      Take 2 tablets by mouth daily   360 tablet   3   . metoprolol tartrate (LOPRESSOR) 25 MG tablet   Oral   Take 75 mg by mouth 2 (two) times daily. Take 3 by mouth two times a day         . niacin (NIASPAN) 500 MG CR tablet   Oral   Take 500 mg by mouth at bedtime.           [name] olmesartan (BENICAR) 40 MG tablet   Oral   Take 40 mg by mouth daily.            BP 160/77  Pulse 64  Temp(Src) 97.8 F (36.6 C) (Oral)  Resp 18  Ht 6\' 3"  (1.905 m)  Wt 308 lb (139.708 kg)  BMI 38.50 kg/m2  SpO2 97% Physical Exam  Nursing note and vitals reviewed. Constitutional: He is oriented to person, place, and time. He appears well-developed and well-nourished. No distress.  HENT:  Head: Normocephalic.  Head is without raccoon's eyes, without laceration, without right periorbital erythema and without left periorbital erythema.    Right Ear: External ear normal.  Left Ear: External ear normal.  Nose: Nose normal.  Mouth/Throat: Oropharynx is clear and moist. No oropharyngeal exudate.  Mild bruising and swelling noted to R eye orbit.   Eyes: Conjunctivae and EOM are normal. Pupils are equal, round, and reactive to light. Right eye exhibits no discharge. Left eye exhibits no discharge.  Cataract to Left eye  Neck: Normal range of motion. Neck supple.  Cardiovascular: Normal rate, regular rhythm and normal heart sounds.   Pulmonary/Chest: Effort normal and breath sounds normal.  Abdominal: Soft.  Musculoskeletal: Normal range of motion.  Neurological: He is alert and oriented to person, place, and time.  Skin: Skin is warm and dry. He is not diaphoretic.  Psychiatric: He has a normal mood and affect.    ED Course  Procedures (including critical care time) Medications - No data to display   DIAGNOSTIC STUDIES: Oxygen Saturation is 97% on RA, normal by my interpretation.    COORDINATION OF CARE:  2:27 PM-Discussed treatment plan which includes CT scans and visual acuity screening with pt at bedside and pt agreed to plan.      Labs Review Labs Reviewed - No data to display Imaging Review Ct Orbitss W/o Cm  06/04/2013   ADDENDUM REPORT: 06/04/2013 16:29  ADDENDUM: The left globe appears elongated compared to the right side.   Electronically Signed   By: Lowella Grip M.D.   On: 06/04/2013 16:29   06/04/2013   CLINICAL DATA:  Trauma with blurred vision  EXAM: CT ORBITS WITHOUT CONTRAST  TECHNIQUE: Multidetector CT imaging of the orbits was performed following the standard protocol without intravenous contrast.  COMPARISON:  None.  FINDINGS: There is no fracture or dislocation. There is no intraorbital mass. The extraocular muscles appear normal. The left globe appears somewhat  healing gated compared to the more rounded right globe. There is a lens implant on the left.  There is mucosal thickening in several ethmoid air cells bilaterally and in  the right inferior maxillary antrum. Other paranasal sinuses appear clear. Ostiomeatal unit complexes are patent bilaterally. There is rightward deviation of the nasal septum.  The visualized brain parenchyma appears unremarkable.  IMPRESSION: No fracture or dislocation. No intraorbital mass. The left globe appears in healing gated compared to the right side. The significance of this finding is uncertain. There is an implanted lens on the left.  Electronically Signed: By: Lowella Grip M.D. On: 06/04/2013 16:09     EKG Interpretation None      MDM   Final diagnoses:  Contusion of right orbital tissues  Cataract, left eye   Filed Vitals:   06/04/13 1402  BP: 160/77  Pulse: 64  Temp: 97.8 F (36.6 C)  Resp: 18   Afebrile, NAD, non-toxic appearing, AAOx4.    Swelling to R orbit. EOMi, PERRLa, no intraocular abnormality noted on R. L cataract noted. R vision intact. No TTP. CT scan obtained to r/o orbital fracture. No evidence of orbital fracture. Advised ophthalmology f/u. Return precautions discussed. Patient is agreeable to plan. Patient is stable at time of discharge. Patient d/w with Dr. Stevie Kern, agrees with plan.    I personally performed the services described in this documentation, which was scribed in my presence. The recorded information has been reviewed and is accurate.       Harlow Mares, PA-C 06/04/13 1654

## 2013-06-04 NOTE — ED Notes (Signed)
Pt reports mowing his yard yesterday and the mower jerked back and hit his right eye. Denies any pain but is having swelling and blurry vision right eye.

## 2013-06-04 NOTE — ED Provider Notes (Signed)
Medical screening examination/treatment/procedure(s) were conducted as a shared visit with non-physician practitioner(s) and myself.  I personally evaluated the patient during the encounter.  Baseline poor vision left eye from cataract; baseline vision right eye without right eye pain; pupils round equal and reactive to light; extraocular movements intact; peripheral visual fields baseline to confrontation; right periorbital tenderness   Babette Relic, MD 06/08/13 [phone]

## 2013-06-04 NOTE — Discharge Instructions (Signed)
Please follow up with your primary care physician in 1-2 days. If you do not have one please call the [facility] number listed above. Please follow up with Dr. Baird Cancer, ophthalmology to schedule a follow up appointment.  Please read all discharge instructions and return precautions.   Eye Contusion An eye contusion is a deep bruise of the eye. This is often called a "black eye." Contusions are the result of an injury that caused bleeding under the skin. The contusion may turn blue, purple, or yellow. Minor injuries will give you a painless contusion, but more severe contusions may stay painful and swollen for a few weeks. If the eye contusion only involves the eyelids and tissues around the eye, the injured area will get better within a few days to weeks. However, eye contusions can be serious and affect the eyeball and sight. CAUSES   Blunt injury or trauma to the face or eye area.  A forehead injury that causes the blood under the skin to work its way down to the eyelids.  Rubbing the eyes due to irritation. SYMPTOMS   Swelling and redness around the eye.  Bruising around the eye.  Tenderness, soreness, or pain around the eye.  Blurry vision.  Tearing.  Eyeball redness. DIAGNOSIS  A diagnosis is usually based on a thorough exam of the eye and surrounding area. The eye must be looked at carefully to make sure it is not injured and to make sure nothing else will threaten your vision. A vision test may be done. An X-ray or computed tomography (CT) scan may be needed to determine if there are any associated injuries, such as broken bones (fractures). TREATMENT  If there is an injury to the eye, treatment will be determined by the nature of the injury. HOME CARE INSTRUCTIONS   Put ice on the injured area.  Put ice in a plastic bag.  Place a towel between your skin and the bag.  Leave the ice on for 15-20 minutes, 03-04 times a day.  If it is determined that  there is no injury to the eye, you may continue normal activities.  Sunglasses may be worn to protect your eyes from bright light if light is uncomfortable.  Sleep with your head elevated. You can put an extra pillow under your head. This may help with discomfort.  Only take over-the-counter or prescription medicines for pain, discomfort, or fever as directed by your caregiver. Do not take aspirin for the first few days. This may increase bruising. SEEK IMMEDIATE MEDICAL CARE IF:   You have any form of vision loss.  You have double vision.  You feel nauseous.  You feel dizzy, sleepy, or like you will faint.  You have any fluid discharge from the eye or your nose.  You have swelling and discoloration that does not fade. MAKE SURE YOU:   Understand these instructions.  Will watch your condition.  Will get help right away if you are not doing well or get worse. Document Released: 02/14/2000 Document Revised: 05/11/2011 Document Reviewed: 01/02/2011 [facility] Patient Information 2014 [place], [place].

## 2013-07-20 NOTE — Progress Notes (Signed)
HPI HPIPatient is a 73 year old with a history of CAD, dyslpiidemia, hypertension and sleep apnea  1`  He is s/p CABG x 4 in 2005 (LIMA to LAD; SVG to OM1/OM2; SVG to RCA). The patinet was last in clinic 1 year ago. He denies CP  Breathing is OK  No dizziness    Allergies  Allergen Reactions  . Ramipril Cough    Current Outpatient Prescriptions  Medication Sig Dispense Refill  . allopurinol (ZYLOPRIM) 100 MG tablet Take 100 mg by mouth daily.      [name] aspirin 81 MG tablet Take 81 mg by mouth daily.        . furosemide (LASIX) 20 MG tablet Take 20 mg by mouth 2 (two) times daily.        [name] lovastatin (MEVACOR) 20 MG tablet Take 20 mg by mouth 2 (two) times daily.      . metoprolol tartrate (LOPRESSOR) 25 MG tablet Take 75 mg by mouth every 12 (twelve) hours.       . niacin (NIASPAN) 500 MG CR tablet Take 500 mg by mouth at bedtime.        [name] olmesartan (BENICAR) 40 MG tablet Take 40 mg by mouth daily.         No current facility-administered medications for this visit.    Past Medical History  Diagnosis Date  . CAD (coronary artery disease)     s/p cath in 2005 and CABG x4  . HTN (hypertension)   . Hyperlipidemia   . OSA (obstructive sleep apnea)     AHI 98/hr  . Obesity     with reduction  . Hyperlipidemia   . H/O: gout   . DJD (degenerative joint disease)   . DJD (degenerative joint disease)   . Fluid retention     mild  . Sleep apnea   . Renal insufficiency   . Stroke 2000  . Myocardial infarction 2009  . History of blood clots   . Cataract     Past Surgical History  Procedure Laterality Date  . Coronary artery bypass graft    . Left inguinal hernia repair      Family History  Problem Relation Age of Onset  . Coronary artery disease      significant in multiple family members  . Gout Mother   . Stroke Mother   . Coronary artery disease Mother   . Hypertension Mother   . Arthritis Mother   . Coronary artery disease Father   . Arthritis Father   . Gout  Father   . Stroke Father   . Hypertension Father   . Coronary artery disease Brother   . Arthritis Brother   . Gout Brother   . Stroke Brother   . Hypertension Brother   . Coronary artery disease Brother   . Arthritis Brother   . Gout Brother   . Stroke Brother   . Hypertension Brother     History   Social History  . Marital Status: Married    Spouse Name: [name]    Number of Children: N/A  . Years of Education: N/A   Occupational History  . Not on file.   Social History Main Topics  . Smoking status: Former Smoker    Types: Cigars  . Smokeless tobacco: Not on file     Comment: used to smoke cigars, 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, none since  . Alcohol Use: No  . Drug Use: No  .  Sexual Activity: Not on file   Other Topics Concern  . Not on file   Social History Narrative  . No narrative on file    Review of Systems:  All systems reviewed.  They are negative to the above problem except as previously stated.  Vital Signs: BP 156/90  Pulse 48  Ht 6\' 3"  (1.905 m)  Wt 304 lb (137.893 kg)  BMI 38.00 kg/m2  Physical Exam Patient in NAD HEENT:  Normocephalic, atraumatic. EOMI, PERRLA.  Neck: JVP is normal.  No bruits.  Lungs: clear to auscultation. No rales no wheezes.  Heart: Regular rate and rhythm. Normal S1, S2. No S3.   No significant murmurs. PMI not displaced.  Abdomen:  Supple, nontender. Normal bowel sounds. No masses. No hepatomegaly.  Extremities:   Good distal pulses throughout. No lower extremity edema.  Musculoskeletal :moving all extremities.  Neuro:   alert and oriented x3.  CN II-XII grossly intact.  EKG  SR 48  First degree AV block.  PR 236 msec.  Nonspecific ST T wave changes.  Assessment and Plan:  1.  CAD  No symptoms of angina.  Keep on same regimen   2.  HTN  BP is a little high today  I would recomm decreasing metoprolol to 25 bid and adding amlodipine 2.5 to regimen.  F/U in 4 to 6 wks for BP   3.  HL  Will need to get labs from Dr.  Marlou Sa re lipids  If not drawn, will draw when returns.   4.  Morbid obesity  Discussed diet, exercise.    F/U in 1 year.

## 2013-07-21 ENCOUNTER — Encounter (INDEPENDENT_AMBULATORY_CARE_PROVIDER_SITE_OTHER): Payer: Self-pay

## 2013-07-21 ENCOUNTER — Ambulatory Visit (INDEPENDENT_AMBULATORY_CARE_PROVIDER_SITE_OTHER): Payer: Medicare Other | Admitting: Internal Medicine

## 2013-07-21 ENCOUNTER — Encounter: Payer: Self-pay | Admitting: Internal Medicine

## 2013-07-21 VITALS — BP 156/90 | HR 48 | Ht 75.0 in | Wt 304.0 lb

## 2013-07-21 DIAGNOSIS — I10 Essential (primary) hypertension: Secondary | ICD-10-CM

## 2013-07-21 DIAGNOSIS — I2510 Atherosclerotic heart disease of native coronary artery without angina pectoris: Secondary | ICD-10-CM

## 2013-07-21 MED ORDER — METOPROLOL TARTRATE 25 MG PO TABS: 25.0000 mg | ORAL_TABLET | Freq: Two times a day (BID) | ORAL | Status: DC

## 2013-07-21 MED ORDER — AMLODIPINE BESYLATE 2.5 MG PO TABS: 2.5000 mg | ORAL_TABLET | Freq: Every day | ORAL | Status: DC

## 2013-07-21 NOTE — Patient Instructions (Addendum)
DECREASE METOPROLOL TO 25 MG TWICE DAILY; NEW RX SENT IN  START AMLODIPINE 2.5 MG 1 TABLET DAILY; NEW RX SENT IN  Your physician recommends that you schedule a follow-up appointment in: 09/11/13 10:45 WITH DR. Harrington Challenger

## 2013-09-11 ENCOUNTER — Encounter: Payer: Medicare Other | Admitting: Internal Medicine

## 2013-09-11 NOTE — Progress Notes (Signed)
HPI HPIPatient is a 73 year old with a history of CAD, dyslpiidemia, hypertension and sleep apnea  1`  He is s/p CABG x 4 in 2005 (LIMA to LAD; SVG to OM1/OM2; SVG to RCA). The [name] was last in clinic 1 year ago. He denies CP  Breathing is OK  No dizziness   I saw the [name] in May  BP was a little high  I recomm adjusting his meds.    Allergies  Allergen Reactions  . Ramipril Cough    Current Outpatient Prescriptions  Medication Sig Dispense Refill  . allopurinol (ZYLOPRIM) 100 MG tablet Take 100 mg by mouth daily.      [name] amLODipine (NORVASC) 2.5 MG tablet Take 1 tablet (2.5 mg total) by mouth daily.  30 tablet  11  . aspirin 81 MG tablet Take 81 mg by mouth daily.        . furosemide (LASIX) 20 MG tablet Take 20 mg by mouth 2 (two) times daily.        [name] lovastatin (MEVACOR) 20 MG tablet Take 20 mg by mouth 2 (two) times daily.      . metoprolol tartrate (LOPRESSOR) 25 MG tablet Take 1 tablet (25 mg total) by mouth 2 (two) times daily.  60 tablet  11  . niacin (NIASPAN) 500 MG CR tablet Take 500 mg by mouth at bedtime.        [name] olmesartan (BENICAR) 40 MG tablet Take 40 mg by mouth daily.         No current facility-administered medications for this visit.    Past Medical History  Diagnosis Date  . CAD (coronary artery disease)     s/p cath in 2005 and CABG x4  . HTN (hypertension)   . Hyperlipidemia   . OSA (obstructive sleep apnea)     AHI 98/hr  . Obesity     with reduction  . Hyperlipidemia   . H/O: gout   . DJD (degenerative joint disease)   . DJD (degenerative joint disease)   . Fluid retention     mild  . Sleep apnea   . Renal insufficiency   . Stroke 2000  . Myocardial infarction 2009  . History of blood clots   . Cataract     Past Surgical History  Procedure Laterality Date  . Coronary artery bypass graft    . Left inguinal hernia repair      Family History  Problem Relation Age of Onset  . Coronary artery disease      significant in multiple  family members  . Gout Mother   . Stroke Mother   . Coronary artery disease Mother   . Hypertension Mother   . Arthritis Mother   . Coronary artery disease Father   . Arthritis Father   . Gout Father   . Stroke Father   . Hypertension Father   . Coronary artery disease Brother   . Arthritis Brother   . Gout Brother   . Stroke Brother   . Hypertension Brother   . Coronary artery disease Brother   . Arthritis Brother   . Gout Brother   . Stroke Brother   . Hypertension Brother     History   Social History  . Marital Status: Married    Spouse Name: [name]    Number of Children: N/A  . Years of Education: N/A   Occupational History  . Not on file.   Social History Main Topics  . Smoking status:  Former Smoker    Types: Cigars  . Smokeless tobacco: Not on file     Comment: used to smoke cigars, 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, none since  . Alcohol Use: No  . Drug Use: No  . Sexual Activity: Not on file   Other Topics Concern  . Not on file   Social History Narrative  . No narrative on file    Review of Systems:  All systems reviewed.  They are negative to the above problem except as previously stated.  Vital Signs: There were no vitals taken for this visit.  Physical Exam Patient in NAD HEENT:  Normocephalic, atraumatic. EOMI, PERRLA.  Neck: JVP is normal.  No bruits.  Lungs: clear to auscultation. No rales no wheezes.  Heart: Regular rate and rhythm. Normal S1, S2. No S3.   No significant murmurs. PMI not displaced.  Abdomen:  Supple, nontender. Normal bowel sounds. No masses. No hepatomegaly.  Extremities:   Good distal pulses throughout. No lower extremity edema.  Musculoskeletal :moving all extremities.  Neuro:   alert and oriented x3.  CN II-XII grossly intact.  EKG  SR 48  First degree AV block.  PR 236 msec.  Nonspecific ST T wave changes.  Assessment and Plan:  1.  CAD  No symptoms of angina.  Keep on same regimen   2.  HTN  BP is a little high  today  I would recomm decreasing metoprolol to 25 bid and adding amlodipine 2.5 to regimen.  F/U in 4 to 6 wks for BP   3.  HL  Will need to get labs from Dr. Marlou Sa re lipids  If not drawn, will draw when returns.   4.  Morbid obesity  Discussed diet, exercise.    F/U in 1 year.  This encounter was created in error - please disregard. This encounter was created in error - please disregard.

## 2014-02-21 ENCOUNTER — Other Ambulatory Visit: Payer: Self-pay | Admitting: Internal Medicine

## 2014-05-18 ENCOUNTER — Other Ambulatory Visit: Payer: Self-pay

## 2014-05-18 MED ORDER — LOVASTATIN 20 MG PO TABS: 20.0000 mg | ORAL_TABLET | Freq: Two times a day (BID) | ORAL | Status: DC

## 2014-06-19 ENCOUNTER — Telehealth: Payer: Self-pay

## 2014-06-19 NOTE — Telephone Encounter (Signed)
Received approval from [facility]/ [facility] for Lovastatin 20mg  tablets, taking 2 po daily thru 03/02/2015. Faxed to [facility] [employer] on [address].

## 2014-08-15 ENCOUNTER — Other Ambulatory Visit: Payer: Self-pay | Admitting: Internal Medicine

## 2014-09-07 ENCOUNTER — Other Ambulatory Visit: Payer: Self-pay | Admitting: Internal Medicine

## 2014-11-01 ENCOUNTER — Other Ambulatory Visit: Payer: Self-pay | Admitting: Internal Medicine

## 2014-11-11 ENCOUNTER — Other Ambulatory Visit: Payer: Self-pay | Admitting: Internal Medicine

## 2014-11-22 ENCOUNTER — Other Ambulatory Visit: Payer: Self-pay | Admitting: Internal Medicine

## 2014-12-16 ENCOUNTER — Other Ambulatory Visit: Payer: Self-pay | Admitting: Internal Medicine

## 2014-12-20 NOTE — Telephone Encounter (Signed)
Refill for 6 months  Needs f/u

## 2015-05-27 ENCOUNTER — Ambulatory Visit (HOSPITAL_COMMUNITY)
Admission: RE | Admit: 2015-05-27 | Discharge: 2015-05-27 | Disposition: A | Discharge status: Home / Self Care | Payer: Medicare Other | Source: Ambulatory Visit | Attending: Internal Medicine | Admitting: Internal Medicine

## 2015-05-27 ENCOUNTER — Other Ambulatory Visit (HOSPITAL_COMMUNITY): Payer: Self-pay | Admitting: Orthopedic Surgery

## 2015-05-27 DIAGNOSIS — I252 Old myocardial infarction: Secondary | ICD-10-CM | POA: Insufficient documentation

## 2015-05-27 DIAGNOSIS — M79605 Pain in left leg: Secondary | ICD-10-CM | POA: Diagnosis not present

## 2015-05-27 DIAGNOSIS — M7989 Other specified soft tissue disorders: Principal | ICD-10-CM

## 2015-05-27 DIAGNOSIS — M79662 Pain in left lower leg: Secondary | ICD-10-CM

## 2015-05-27 DIAGNOSIS — I2510 Atherosclerotic heart disease of native coronary artery without angina pectoris: Secondary | ICD-10-CM | POA: Diagnosis not present

## 2015-05-27 DIAGNOSIS — I10 Essential (primary) hypertension: Secondary | ICD-10-CM | POA: Diagnosis not present

## 2015-05-27 DIAGNOSIS — G4733 Obstructive sleep apnea (adult) (pediatric): Secondary | ICD-10-CM | POA: Diagnosis not present

## 2015-05-27 DIAGNOSIS — Z8673 Personal history of transient ischemic attack (TIA), and cerebral infarction without residual deficits: Secondary | ICD-10-CM | POA: Insufficient documentation

## 2015-05-27 DIAGNOSIS — E785 Hyperlipidemia, unspecified: Secondary | ICD-10-CM | POA: Insufficient documentation

## 2015-05-27 NOTE — Progress Notes (Signed)
VASCULAR LAB PRELIMINARY  PRELIMINARY  PRELIMINARY  PRELIMINARY  Left lower extremity venous duplex completed.    Preliminary report:  Left:  No evidence of DVT, superficial thrombosis, or Baker's cyst.  Kraven Calk, RVT 05/27/2015, 2:06 PM

## 2016-07-24 ENCOUNTER — Ambulatory Visit (INDEPENDENT_AMBULATORY_CARE_PROVIDER_SITE_OTHER): Payer: Medicare Other | Admitting: Podiatry

## 2016-07-24 VITALS — BP 139/84 | HR 84

## 2016-07-24 DIAGNOSIS — B351 Tinea unguium: Secondary | ICD-10-CM

## 2016-07-24 DIAGNOSIS — L84 Corns and callosities: Secondary | ICD-10-CM

## 2016-07-24 NOTE — Progress Notes (Signed)
   Subjective:    Patient ID: [name], male    DOB: Jan 15, 1941, 76 y.o.   MRN: [id number]  HPI    Review of Systems  Genitourinary: Positive for frequency and urgency.  All other systems reviewed and are negative.      Objective:   Physical Exam        Assessment & Plan:

## 2016-07-25 NOTE — Progress Notes (Signed)
Subjective:    Patient ID: [name], male   DOB: 76 y.o.   MRN: [id number]   HPI patient presents with severe lesion formation of the hallux right over left with crusted tissue formation and pain with wearing shoe gear. Also has thick nailbeds 1-5 both feet    Review of Systems  All other systems reviewed and are negative.       Objective:  Physical Exam  Constitutional: He is oriented to person, place, and time.  Cardiovascular: Intact distal pulses.   Musculoskeletal: Normal range of motion.  Neurological: He is alert and oriented to person, place, and time.  Skin: Skin is warm and dry.  Nursing note and vitals reviewed.  Neurovascular status found to be intact muscle strength was adequate range of motion within normal limits with severe keratotic lesion of the right over left hallux medial side and thick nail disease with overall try poor health's structural skin     Assessment:    Lesion secondary to pressure and gait type walking with nail disease also noted     Plan:    H&P and both conditions reviewed. Today aggressive debridement of lesions accomplished with padding and debrided nails which make him feel a lot better and I explained this will be done at one point in future

## 2016-08-14 ENCOUNTER — Emergency Department (HOSPITAL_COMMUNITY)
Admission: EM | Admit: 2016-08-14 | Discharge: 2016-08-14 | Disposition: A | Discharge status: Home / Self Care | Payer: Medicare Other | Attending: Emergency Medicine | Admitting: Emergency Medicine

## 2016-08-14 ENCOUNTER — Encounter (HOSPITAL_COMMUNITY): Payer: Self-pay

## 2016-08-14 DIAGNOSIS — K5900 Constipation, unspecified: Secondary | ICD-10-CM | POA: Diagnosis present

## 2016-08-14 DIAGNOSIS — I10 Essential (primary) hypertension: Secondary | ICD-10-CM | POA: Insufficient documentation

## 2016-08-14 DIAGNOSIS — Z7982 Long term (current) use of aspirin: Secondary | ICD-10-CM | POA: Diagnosis not present

## 2016-08-14 DIAGNOSIS — Z8673 Personal history of transient ischemic attack (TIA), and cerebral infarction without residual deficits: Secondary | ICD-10-CM | POA: Diagnosis not present

## 2016-08-14 DIAGNOSIS — Z87891 Personal history of nicotine dependence: Secondary | ICD-10-CM | POA: Insufficient documentation

## 2016-08-14 DIAGNOSIS — I252 Old myocardial infarction: Secondary | ICD-10-CM | POA: Insufficient documentation

## 2016-08-14 DIAGNOSIS — I2510 Atherosclerotic heart disease of native coronary artery without angina pectoris: Secondary | ICD-10-CM | POA: Insufficient documentation

## 2016-08-14 DIAGNOSIS — K5641 Fecal impaction: Secondary | ICD-10-CM | POA: Diagnosis not present

## 2016-08-14 NOTE — ED Notes (Signed)
ED Provider at bedside. 

## 2016-08-14 NOTE — ED Notes (Signed)
Soap suds enema administered to pt. Bedside commode available. Will wait approx 10 minutes before having pt attempt to void bowels.

## 2016-08-14 NOTE — ED Triage Notes (Signed)
Pt reports constipation, LBM was Monday, with no relief from Miralax and other OTC meds. He denies abdominal pain. A&Ox4.

## 2016-08-14 NOTE — ED Notes (Signed)
Request for soap suds enema sent to materials management. Will perform enema when kit arrives.

## 2016-08-14 NOTE — ED Provider Notes (Signed)
[place] DEPT Provider Note   CSN: [id number] Arrival date & time: 08/14/16  1407     History   Chief Complaint Chief Complaint  Patient presents with  . Constipation    HPI [name] is a 76 y.o. male.  HPI   76 year old male with a history of constipation on MiraLAX and stool softeners presents the ED for exacerbation of his constipation. Patient reports that he has not had a bowel movement since Monday. Still passing gas. Denies any abdominal pain or distention. Has tried taking extra doses of his bowel regimen without any relief. Denies any other alleviating or aggravating factors. Denies any fevers, illnesses, infections. Denies any other physical complaints.    Past Medical History:  Diagnosis Date  . CAD (coronary artery disease)    s/p cath in 2005 and CABG x4  . Cataract   . DJD (degenerative joint disease)   . DJD (degenerative joint disease)   . Fluid retention    mild  . H/O: gout   . History of blood clots   . HTN (hypertension)   . Hyperlipidemia   . Hyperlipidemia   . Myocardial infarction ([place]) 2009  . Obesity    with reduction  . OSA (obstructive sleep apnea)    AHI 98/hr  . Renal insufficiency   . Sleep apnea   . Stroke [facility]) 2000    Patient Active Problem List   Diagnosis Date Noted  . OBESITY, UNSPECIFIED 05/31/2009  . HYPERLIPIDEMIA 05/30/2009  . OBSTRUCTIVE SLEEP APNEA 05/30/2009  . HYPERTENSION 05/30/2009  . CAD 05/30/2009    Past Surgical History:  Procedure Laterality Date  . CORONARY ARTERY BYPASS GRAFT    . left inguinal hernia repair         Home Medications    Prior to Admission medications   Medication Sig Start Date End Date Taking? Authorizing Provider  allopurinol (ZYLOPRIM) 100 MG tablet Take 100 mg by mouth daily.   Yes Rogers Blocker, MD  amLODipine (NORVASC) 2.5 MG tablet TAKE ONE TABLET BY MOUTH ONCE DAILY. MUST HAVE APPOINTMENT BEFORE FURTHER REFILLS. 12/21/14  Yes Fay Records, MD  aspirin 81 MG  tablet Take 81 mg by mouth daily.     Yes [provider]  furosemide (LASIX) 20 MG tablet Take 20 mg by mouth 2 (two) times daily.     Yes [provider]  lovastatin (MEVACOR) 20 MG tablet TAKE ONE TABLET BY MOUTH TWICE DAILY. MUST HAVE OFFICE VISIT BEFORE FURTHER REFILL. Patient taking differently: TAKE ONE 20 mg TABLET BY MOUTH TWICE DAILY. MUST HAVE OFFICE VISIT BEFORE FURTHER REFILL. 12/21/14  Yes Fay Records, MD  metoprolol tartrate (LOPRESSOR) 25 MG tablet TAKE ONE TABLET BY MOUTH TWICE DAILY 11/12/14  Yes Fay Records, MD  niacin (NIASPAN) 500 MG CR tablet Take 500 mg by mouth at bedtime.     Yes [provider]  olmesartan (BENICAR) 40 MG tablet Take 40 mg by mouth daily.     Yes [provider]    Family History Family History  Problem Relation Age of Onset  . Gout Mother   . Stroke Mother   . Coronary artery disease Mother   . Hypertension Mother   . Arthritis Mother   . Coronary artery disease Father   . Arthritis Father   . Gout Father   . Stroke Father   . Hypertension Father   . Coronary artery disease Brother   . Arthritis Brother   .  Gout Brother   . Stroke Brother   . Hypertension Brother   . Coronary artery disease Brother   . Arthritis Brother   . Gout Brother   . Stroke Brother   . Hypertension Brother   . Coronary artery disease Unknown        significant in multiple family members    Social History Social History  Substance Use Topics  . Smoking status: Former Smoker    Types: Cigars  . Smokeless tobacco: Never Used     Comment: used to smoke cigars, 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, none since  . Alcohol use No     Allergies   Ramipril   Review of Systems Review of Systems All other systems are reviewed and are negative for acute change except as noted in the HPI   Physical Exam Updated Vital Signs BP 115/60 (BP Location: Left Arm)   Pulse 77   Temp 98.4 F (36.9 C) (Oral)   Resp 16   Ht 6\' 2"   (1.88 m)   Wt 136.1 kg (300 lb)   SpO2 98%   BMI 38.52 kg/m   Physical Exam  Constitutional: He is oriented to person, place, and time. He appears well-developed and well-nourished. No distress.  HENT:  Head: Normocephalic and atraumatic.  Right Ear: External ear normal.  Left Ear: External ear normal.  Nose: Nose normal.  Mouth/Throat: Mucous membranes are normal. No trismus in the jaw.  Eyes: Conjunctivae and EOM are normal. No scleral icterus.  Neck: Normal range of motion and phonation normal.  Cardiovascular: Normal rate and regular rhythm.   Pulmonary/Chest: Effort normal. No stridor. No respiratory distress.  Abdominal: He exhibits no distension. There is no tenderness. There is no rigidity, no rebound and no guarding.  Genitourinary: Rectal exam shows internal hemorrhoid. Rectal exam shows no external hemorrhoid.  Genitourinary Comments: Hard stool within the rectal vault.  Musculoskeletal: Normal range of motion. He exhibits no edema.  Neurological: He is alert and oriented to person, place, and time.  Skin: He is not diaphoretic.  Psychiatric: He has a normal mood and affect. His behavior is normal.  Vitals reviewed.    ED Treatments / Results  Labs (all labs ordered are listed, but only abnormal results are displayed) Labs Reviewed - No data to display  EKG  EKG Interpretation None       Radiology No results found.  Procedures Fecal disimpaction Date/Time: 08/14/2016 7:48 PM Performed by: Fatima Blank Authorized by: Fatima Blank  Consent: Verbal consent obtained. Consent given by: patient Patient understanding: patient states understanding of the procedure being performed Patient identity confirmed: arm band Time out: Immediately prior to procedure a "time out" was called to verify the correct patient, procedure, equipment, support staff and site/side marked as required. Local anesthesia used: no  Anesthesia: Local anesthesia  used: no  Sedation: Patient sedated: no Patient tolerance: Patient tolerated the procedure well with no immediate complications Comments: Small amount of brown stool retrieved. Other stool in the rectal vault was broken up.    (including critical care time)  Medications Ordered in ED Medications - No data to display   Initial Impression / Assessment and Plan / ED Course  I have reviewed the triage vital signs and the nursing notes.  Pertinent labs & imaging results that were available during my care of the patient were reviewed by me and considered in my medical decision making (see chart for details).     Presentation  consistent with fecal impaction. Abdomen benign, low suspicious for serious intra-abdominal inflammatory/infectious processes such as diverticulitis. Doubt small bowel obstruction. Fecal disimpaction performed and was able to get a small amount of stool. Soapsuds enema performed.  Patient was able to have a large bowel movement.  The patient is safe for discharge with strict return precautions.  Final Clinical Impressions(s) / ED Diagnoses   Final diagnoses:  Fecal impaction in rectum [facility])   Disposition: Discharge  Condition: Good  I have discussed the results, Dx and Tx plan with the patient who expressed understanding and agree(s) with the plan. Discharge instructions discussed at great length. The patient was given strict return precautions who verbalized understanding of the instructions. No further questions at time of discharge.    New Prescriptions   No medications on file    Follow Up: Rogers Blocker, [place] [id number] [phone]  Schedule an appointment as soon as possible for a visit  As needed      Fatima Blank, MD 08/14/16 2233

## 2016-08-14 NOTE — ED Notes (Signed)
Pt assisted back into bed from bedside commode. Significant amount of stool and enema contents noted in bedside commode.

## 2016-12-09 ENCOUNTER — Inpatient Hospital Stay (HOSPITAL_BASED_OUTPATIENT_CLINIC_OR_DEPARTMENT_OTHER)
Admission: EM | Admit: 2016-12-09 | Discharge: 2016-12-12 | DRG: 872 | Disposition: A | Discharge status: Home Health | Payer: Medicare Other | Attending: Internal Medicine | Admitting: Internal Medicine

## 2016-12-09 ENCOUNTER — Emergency Department (HOSPITAL_BASED_OUTPATIENT_CLINIC_OR_DEPARTMENT_OTHER): Payer: Medicare Other

## 2016-12-09 ENCOUNTER — Encounter (HOSPITAL_BASED_OUTPATIENT_CLINIC_OR_DEPARTMENT_OTHER): Payer: Self-pay

## 2016-12-09 DIAGNOSIS — I5032 Chronic diastolic (congestive) heart failure: Secondary | ICD-10-CM | POA: Diagnosis present

## 2016-12-09 DIAGNOSIS — Z951 Presence of aortocoronary bypass graft: Secondary | ICD-10-CM

## 2016-12-09 DIAGNOSIS — R778 Other specified abnormalities of plasma proteins: Secondary | ICD-10-CM | POA: Diagnosis present

## 2016-12-09 DIAGNOSIS — E872 Acidosis, unspecified: Secondary | ICD-10-CM

## 2016-12-09 DIAGNOSIS — A408 Other streptococcal sepsis: Principal | ICD-10-CM | POA: Diagnosis present

## 2016-12-09 DIAGNOSIS — R531 Weakness: Secondary | ICD-10-CM

## 2016-12-09 DIAGNOSIS — G4733 Obstructive sleep apnea (adult) (pediatric): Secondary | ICD-10-CM | POA: Diagnosis present

## 2016-12-09 DIAGNOSIS — R509 Fever, unspecified: Secondary | ICD-10-CM | POA: Diagnosis not present

## 2016-12-09 DIAGNOSIS — Z86718 Personal history of other venous thrombosis and embolism: Secondary | ICD-10-CM

## 2016-12-09 DIAGNOSIS — N289 Disorder of kidney and ureter, unspecified: Secondary | ICD-10-CM

## 2016-12-09 DIAGNOSIS — B955 Unspecified streptococcus as the cause of diseases classified elsewhere: Secondary | ICD-10-CM

## 2016-12-09 DIAGNOSIS — I2510 Atherosclerotic heart disease of native coronary artery without angina pectoris: Secondary | ICD-10-CM | POA: Diagnosis present

## 2016-12-09 DIAGNOSIS — A419 Sepsis, unspecified organism: Secondary | ICD-10-CM

## 2016-12-09 DIAGNOSIS — I10 Essential (primary) hypertension: Secondary | ICD-10-CM | POA: Diagnosis not present

## 2016-12-09 DIAGNOSIS — I252 Old myocardial infarction: Secondary | ICD-10-CM

## 2016-12-09 DIAGNOSIS — I130 Hypertensive heart and chronic kidney disease with heart failure and stage 1 through stage 4 chronic kidney disease, or unspecified chronic kidney disease: Secondary | ICD-10-CM | POA: Diagnosis present

## 2016-12-09 DIAGNOSIS — N183 Chronic kidney disease, stage 3 (moderate): Secondary | ICD-10-CM | POA: Diagnosis present

## 2016-12-09 DIAGNOSIS — Z8673 Personal history of transient ischemic attack (TIA), and cerebral infarction without residual deficits: Secondary | ICD-10-CM

## 2016-12-09 DIAGNOSIS — M109 Gout, unspecified: Secondary | ICD-10-CM | POA: Diagnosis present

## 2016-12-09 DIAGNOSIS — R7881 Bacteremia: Secondary | ICD-10-CM

## 2016-12-09 DIAGNOSIS — R7989 Other specified abnormal findings of blood chemistry: Secondary | ICD-10-CM

## 2016-12-09 DIAGNOSIS — M7989 Other specified soft tissue disorders: Secondary | ICD-10-CM | POA: Diagnosis present

## 2016-12-09 DIAGNOSIS — E785 Hyperlipidemia, unspecified: Secondary | ICD-10-CM | POA: Diagnosis present

## 2016-12-09 DIAGNOSIS — Z87891 Personal history of nicotine dependence: Secondary | ICD-10-CM

## 2016-12-09 LAB — COMPREHENSIVE METABOLIC PANEL
ALT: 16 U/L — AB (ref 17–63)
AST: 29 U/L (ref 15–41)
Albumin: 3.8 g/dL (ref 3.5–5.0)
Alkaline Phosphatase: 54 U/L (ref 38–126)
Anion gap: 8 (ref 5–15)
BUN: 30 mg/dL — ABNORMAL HIGH (ref 6–20)
CHLORIDE: 108 mmol/L (ref 101–111)
CO2: 22 mmol/L (ref 22–32)
CREATININE: 1.49 mg/dL — AB (ref 0.61–1.24)
Calcium: 9.5 mg/dL (ref 8.9–10.3)
GFR, EST AFRICAN AMERICAN: 52 mL/min — AB (ref >60)
GFR, EST NON AFRICAN AMERICAN: 44 mL/min — AB (ref >60)
Glucose, Bld: 114 mg/dL — ABNORMAL HIGH (ref 65–99)
POTASSIUM: 4.5 mmol/L (ref 3.5–5.1)
Sodium: 138 mmol/L (ref 135–145)
Total Bilirubin: 0.5 mg/dL (ref 0.3–1.2)
Total Protein: 8 g/dL (ref 6.5–8.1)

## 2016-12-09 LAB — URINALYSIS, ROUTINE W REFLEX MICROSCOPIC
Bilirubin Urine: NEGATIVE
Glucose, UA: NEGATIVE mg/dL
HGB URINE DIPSTICK: NEGATIVE
Ketones, ur: NEGATIVE mg/dL
LEUKOCYTES UA: NEGATIVE
Nitrite: NEGATIVE
Protein, ur: NEGATIVE mg/dL
pH: 6.5 (ref 5.0–8.0)

## 2016-12-09 LAB — CBC WITH DIFFERENTIAL/PLATELET
Basophils Absolute: 0 10*3/uL (ref 0.0–0.1)
Basophils Relative: 0 %
EOS ABS: 0 10*3/uL (ref 0.0–0.7)
Eosinophils Relative: 0 %
HCT: 37.4 % — ABNORMAL LOW (ref 39.0–52.0)
HEMOGLOBIN: 12.3 g/dL — AB (ref 13.0–17.0)
LYMPHS ABS: 0.9 10*3/uL (ref 0.7–4.0)
Lymphocytes Relative: 6 %
MCH: 27.6 pg (ref 26.0–34.0)
MCHC: 32.9 g/dL (ref 30.0–36.0)
MCV: 84 fL (ref 78.0–100.0)
Monocytes Absolute: 1.2 10*3/uL — ABNORMAL HIGH (ref 0.1–1.0)
Monocytes Relative: 7 %
NEUTROS PCT: 87 %
Neutro Abs: 14 10*3/uL — ABNORMAL HIGH (ref 1.7–7.7)
Platelets: 197 10*3/uL (ref 150–400)
RBC: 4.45 MIL/uL (ref 4.22–5.81)
RDW: 15.2 % (ref 11.5–15.5)
WBC: 16.1 10*3/uL — AB (ref 4.0–10.5)

## 2016-12-09 LAB — I-STAT CG4 LACTIC ACID, ED
LACTIC ACID, VENOUS: 2.4 mmol/L — AB (ref 0.5–1.9)
LACTIC ACID, VENOUS: 2.2 mmol/L — AB (ref 0.5–1.9)

## 2016-12-09 LAB — BRAIN NATRIURETIC PEPTIDE: B Natriuretic Peptide: 59.6 pg/mL (ref 0.0–100.0)

## 2016-12-09 LAB — TROPONIN I: TROPONIN I: 0.03 ng/mL — AB (ref <0.03)

## 2016-12-09 LAB — INFLUENZA PANEL BY PCR (TYPE A & B)
INFLAPCR: NEGATIVE
INFLBPCR: NEGATIVE

## 2016-12-09 MED ORDER — OSELTAMIVIR PHOSPHATE 75 MG PO CAPS
75.0000 mg | ORAL_CAPSULE | Freq: Once | ORAL | Status: AC
  Administered 2016-12-09: 75 mg via ORAL
  Filled 2016-12-09: qty 1

## 2016-12-09 MED ORDER — PIPERACILLIN-TAZOBACTAM 3.375 G IVPB
3.3750 g | Freq: Three times a day (TID) | INTRAVENOUS | Status: DC
  Administered 2016-12-10 (×2): 3.375 g via INTRAVENOUS
  Filled 2016-12-09 (×3): qty 50

## 2016-12-09 MED ORDER — SODIUM CHLORIDE 0.9 % IV BOLUS (SEPSIS)
500.0000 mL | Freq: Once | INTRAVENOUS | Status: AC
  Administered 2016-12-09: 500 mL via INTRAVENOUS

## 2016-12-09 MED ORDER — VANCOMYCIN HCL 10 G IV SOLR: 1250.0000 mg | INTRAVENOUS | Status: DC

## 2016-12-09 MED ORDER — ACETAMINOPHEN 325 MG PO TABS
650.0000 mg | ORAL_TABLET | Freq: Four times a day (QID) | ORAL | Status: DC | PRN
  Administered 2016-12-09 – 2016-12-10 (×2): 650 mg via ORAL
  Filled 2016-12-09 (×2): qty 2

## 2016-12-09 MED ORDER — ONDANSETRON HCL 4 MG PO TABS: 4.0000 mg | ORAL_TABLET | Freq: Four times a day (QID) | ORAL | Status: DC | PRN

## 2016-12-09 MED ORDER — IBUPROFEN 800 MG PO TABS
800.0000 mg | ORAL_TABLET | Freq: Once | ORAL | Status: AC
  Administered 2016-12-09: 800 mg via ORAL
  Filled 2016-12-09: qty 1

## 2016-12-09 MED ORDER — SODIUM CHLORIDE 0.9 % IV SOLN: 250.0000 mL | INTRAVENOUS | Status: DC | PRN

## 2016-12-09 MED ORDER — PIPERACILLIN-TAZOBACTAM 3.375 G IVPB 30 MIN
3.3750 g | Freq: Once | INTRAVENOUS | Status: AC
  Administered 2016-12-09: 3.375 g via INTRAVENOUS
  Filled 2016-12-09 (×2): qty 50

## 2016-12-09 MED ORDER — VANCOMYCIN HCL 10 G IV SOLR
1500.0000 mg | Freq: Once | INTRAVENOUS | Status: AC
  Administered 2016-12-09: 1500 mg via INTRAVENOUS
  Filled 2016-12-09: qty 1500

## 2016-12-09 MED ORDER — ACETAMINOPHEN 650 MG RE SUPP: 650.0000 mg | Freq: Four times a day (QID) | RECTAL | Status: DC | PRN

## 2016-12-09 MED ORDER — ASPIRIN EC 81 MG PO TBEC
81.0000 mg | DELAYED_RELEASE_TABLET | Freq: Every day | ORAL | Status: DC
  Administered 2016-12-09 – 2016-12-12 (×4): 81 mg via ORAL
  Filled 2016-12-09 (×4): qty 1

## 2016-12-09 MED ORDER — ONDANSETRON HCL 4 MG/2ML IJ SOLN: 4.0000 mg | Freq: Four times a day (QID) | INTRAMUSCULAR | Status: DC | PRN

## 2016-12-09 MED ORDER — VANCOMYCIN HCL IN DEXTROSE 1-5 GM/200ML-% IV SOLN
1000.0000 mg | Freq: Once | INTRAVENOUS | Status: AC
  Administered 2016-12-09: 1000 mg via INTRAVENOUS
  Filled 2016-12-09: qty 200

## 2016-12-09 MED ORDER — SODIUM CHLORIDE 0.9 % IV SOLN
Freq: Once | INTRAVENOUS | Status: AC
  Administered 2016-12-09: 18:00:00 via INTRAVENOUS

## 2016-12-09 MED ORDER — ACETAMINOPHEN 500 MG PO TABS
1000.0000 mg | ORAL_TABLET | Freq: Once | ORAL | Status: AC
  Administered 2016-12-09: 1000 mg via ORAL
  Filled 2016-12-09: qty 2

## 2016-12-09 MED ORDER — ACETAMINOPHEN 325 MG PO TABS
650.0000 mg | ORAL_TABLET | Freq: Once | ORAL | Status: AC | PRN
  Administered 2016-12-09: 650 mg via ORAL
  Filled 2016-12-09: qty 2

## 2016-12-09 MED ORDER — SODIUM CHLORIDE 0.9% FLUSH: 3.0000 mL | INTRAVENOUS | Status: DC | PRN

## 2016-12-09 MED ORDER — METOPROLOL TARTRATE 25 MG PO TABS
25.0000 mg | ORAL_TABLET | Freq: Two times a day (BID) | ORAL | Status: DC
  Administered 2016-12-09 – 2016-12-12 (×6): 25 mg via ORAL
  Filled 2016-12-09 (×6): qty 1

## 2016-12-09 MED ORDER — SODIUM CHLORIDE 0.9% FLUSH
3.0000 mL | Freq: Two times a day (BID) | INTRAVENOUS | Status: DC
  Administered 2016-12-10 – 2016-12-11 (×2): 3 mL via INTRAVENOUS

## 2016-12-09 NOTE — ED Notes (Signed)
EDP made aware of pt temp. No orders given.

## 2016-12-09 NOTE — ED Notes (Signed)
ED Provider at bedside. 

## 2016-12-09 NOTE — Progress Notes (Signed)
Pharmacy Antibiotic Note  [name] is a 76 y.o. male admitted on 12/09/2016 with fever and shaking chills.  Pharmacy has been consulted for vancomycin/Zosyn dosing. N/v, WBC 16.1, Tmax 103.1, Lactic acid 2.4>2.2, No flu shot this year. Patient received one dose of each vancomycin 1000 mg, Zosyn 3.375 g, and Tamiflu 75 mg at [facility] prior to being transferred to [facility]. normCrCl ~44 mL/min.  Plan: Vancomycin 1500 mg x 1 to complete loading dose, followed by 1250 mg IV every 24 hours.  Goal trough 15-20 mcg/mL. Zosyn 3.375g IV q8h (4 hour infusion).  Monitor clinical progress, cultures/sensitivities, renal function, abx plan Vancomycin trough as indicated   Height: 6\' 2"  (188 cm) Weight: 285 lb 7.9 oz (129.5 kg) IBW/kg (Calculated) : 82.2  Temp (24hrs), Avg:102 F (38.9 C), Min:98.8 F (37.1 C), Max:103.1 F (39.5 C)   Recent Labs Lab 12/09/16 1209 12/09/16 1222 12/09/16 1506  WBC 16.1*  --   --   CREATININE 1.49*  --   --   LATICACIDVEN  --  2.40* 2.20*    Estimated Creatinine Clearance: 62.2 mL/min (A) (by C-G formula based on SCr of 1.49 mg/dL (H)).    Allergies  Allergen Reactions  . Ramipril Cough    Antimicrobials this admission: 10/10 Vancomycin >>  10/10 Zosyn >>  10/10 Tamiflu >>  Dose adjustments this admission:  Microbiology results: 10/10 BCx: sent   Thank you for allowing [place] to participate in this patients care.  Jens Som, PharmD Clinical phone for 12/09/2016 from 3:30-10:30p: [phone] If after 10:30p, please call main pharmacy at: [phone] 12/09/2016 9:30 PM

## 2016-12-09 NOTE — ED Notes (Signed)
Patient transported to Ultrasound 

## 2016-12-09 NOTE — ED Notes (Signed)
Patient transported to X-ray 

## 2016-12-09 NOTE — H&P (Signed)
History and Physical    [name] GHW:[id number] DOB: 06/24/1940 DOA: 12/09/2016  PCP: Rogers Blocker, MD  Patient coming from: home  Chief Complaint:  fever  HPI: [name] is a 76 y.o. male with medical history significant of CAD, HTN, OSA, HLD comes in with over a day of chills and subjective fever.  No rash.  No headache.  No n/v/d.  Well vomited once this am only.  No urinary symptoms such as dysuria or hematuria.  No cough.  No sob.  No pain in chest or abdomen.  No nasal congestion or any uri symptoms.  Went to urgent care found to have high temp over 103 and for some reason a troponin was checked which was 0.03.  Therefore pt was referred for admission for possible ACS.  Review of Systems: As per HPI otherwise 10 point review of systems negative.   Past Medical History:  Diagnosis Date  . CAD (coronary artery disease)    s/p cath in 2005 and CABG x4  . Cataract   . DJD (degenerative joint disease)   . DJD (degenerative joint disease)   . Fluid retention    mild  . H/O: gout   . History of blood clots   . HTN (hypertension)   . Hyperlipidemia   . Hyperlipidemia   . Myocardial infarction ([place]) 2009  . Obesity    with reduction  . OSA (obstructive sleep apnea)    AHI 98/hr  . Renal insufficiency   . Sleep apnea   . Stroke [facility]) 2000    Past Surgical History:  Procedure Laterality Date  . CORONARY ARTERY BYPASS GRAFT    . left inguinal hernia repair       reports that he has quit smoking. His smoking use included Cigars. He has never used smokeless tobacco. He reports that he does not drink alcohol or use drugs.  Allergies  Allergen Reactions  . Ramipril Cough    Family History  Problem Relation Age of Onset  . Gout Mother   . Stroke Mother   . Coronary artery disease Mother   . Hypertension Mother   . Arthritis Mother   . Coronary artery disease Father   . Arthritis Father   . Gout Father   . Stroke Father   . Hypertension Father   . Coronary  artery disease Brother   . Arthritis Brother   . Gout Brother   . Stroke Brother   . Hypertension Brother   . Coronary artery disease Brother   . Arthritis Brother   . Gout Brother   . Stroke Brother   . Hypertension Brother   . Coronary artery disease Unknown        significant in multiple family members    Prior to Admission medications   Medication Sig Start Date End Date Taking? Authorizing Provider  allopurinol (ZYLOPRIM) 100 MG tablet Take 100 mg by mouth daily.    Rogers Blocker, MD  amLODipine (NORVASC) 2.5 MG tablet TAKE ONE TABLET BY MOUTH ONCE DAILY. MUST HAVE APPOINTMENT BEFORE FURTHER REFILLS. 12/21/14   Fay Records, MD  aspirin 81 MG tablet Take 81 mg by mouth daily.      [provider]  furosemide (LASIX) 20 MG tablet Take 20 mg by mouth 2 (two) times daily.      [provider]  lovastatin (MEVACOR) 20 MG tablet TAKE ONE TABLET BY MOUTH TWICE DAILY. MUST HAVE OFFICE VISIT BEFORE FURTHER REFILL. Patient taking  differently: TAKE ONE 20 mg TABLET BY MOUTH TWICE DAILY. MUST HAVE OFFICE VISIT BEFORE FURTHER REFILL. 12/21/14   Fay Records, MD  metoprolol tartrate (LOPRESSOR) 25 MG tablet TAKE ONE TABLET BY MOUTH TWICE DAILY 11/12/14   Fay Records, MD  niacin (NIASPAN) 500 MG CR tablet Take 500 mg by mouth at bedtime.      [provider]  olmesartan (BENICAR) 40 MG tablet Take 40 mg by mouth daily.      [provider]    Physical Exam: Vitals:   12/09/16 1800 12/09/16 1804 12/09/16 1830 12/09/16 2004  BP: 114/63  123/67 130/69  Pulse: (!) 126  (!) 121 (!) 107  Resp: (!) 30  19 20   Temp:  (!) 100.8 F (38.2 C)  98.8 F (37.1 C)  TempSrc:  Oral  Oral  SpO2: 96%  97% 97%  Weight:    129.5 kg (285 lb 7.9 oz)  Height:    6\' 2"  (1.88 m)     Constitutional: NAD, calm, comfortable Vitals:   12/09/16 1800 12/09/16 1804 12/09/16 1830 12/09/16 2004  BP: 114/63  123/67 130/69  Pulse: (!) 126  (!) 121 (!) 107  Resp: (!) 30  19 20     Temp:  (!) 100.8 F (38.2 C)  98.8 F (37.1 C)  TempSrc:  Oral  Oral  SpO2: 96%  97% 97%  Weight:    129.5 kg (285 lb 7.9 oz)  Height:    6\' 2"  (1.88 m)   Eyes: PERRL, lids and conjunctivae normal ENMT: Mucous membranes are moist. Posterior pharynx clear of any exudate or lesions.Normal dentition.  Neck: normal, supple, no masses, no thyromegaly Respiratory: clear to auscultation bilaterally, no wheezing, no crackles. Normal respiratory effort. No accessory muscle use.  Cardiovascular: Regular rate and rhythm, no murmurs / rubs / gallops. No extremity edema. 2+ pedal pulses. No carotid bruits.  Abdomen: no tenderness, no masses palpated. No hepatosplenomegaly. Bowel sounds positive.  Musculoskeletal: no clubbing / cyanosis. No joint deformity upper and lower extremities. Good ROM, no contractures. Normal muscle tone.  Skin: no rashes, lesions, ulcers. No induration Neurologic: CN 2-12 grossly intact. Sensation intact, DTR normal. Strength 5/5 in all 4.  Psychiatric: Normal judgment and insight. Alert and oriented x 3. Normal mood.    Labs on Admission: I have personally reviewed following labs and imaging studies  CBC:  Recent Labs Lab 12/09/16 1209  WBC 16.1*  NEUTROABS 14.0*  HGB 12.3*  HCT 37.4*  MCV 84.0  PLT 476   Basic Metabolic Panel:  Recent Labs Lab 12/09/16 1209  NA 138  K 4.5  CL 108  CO2 22  GLUCOSE 114*  BUN 30*  CREATININE 1.49*  CALCIUM 9.5   GFR: Estimated Creatinine Clearance: 62.2 mL/min (A) (by C-G formula based on SCr of 1.49 mg/dL (H)). Liver Function Tests:  Recent Labs Lab 12/09/16 1209  AST 29  ALT 16*  ALKPHOS 54  BILITOT 0.5  PROT 8.0  ALBUMIN 3.8   Cardiac Enzymes:  Recent Labs Lab 12/09/16 1209  TROPONINI 0.03*   Urine analysis:    Component Value Date/Time   COLORURINE YELLOW 12/09/2016 1410   APPEARANCEUR CLEAR 12/09/2016 1410   LABSPEC <1.005 (L) 12/09/2016 1410   PHURINE 6.5 12/09/2016 1410   GLUCOSEU  NEGATIVE 12/09/2016 1410   HGBUR NEGATIVE 12/09/2016 [place] 12/09/2016 [place] 12/09/2016 1410   PROTEINUR NEGATIVE 12/09/2016 1410   UROBILINOGEN 1.0 10/08/2008 2214  NITRITE NEGATIVE 12/09/2016 1410   LEUKOCYTESUR NEGATIVE 12/09/2016 1410    Radiological Exams on Admission: Dg Chest 2 View  Result Date: 12/09/2016 CLINICAL DATA:  76 year old male with weakness fever chills nausea and vomiting since last night. EXAM: CHEST  2 VIEW COMPARISON:  10/08/2008 and earlier. FINDINGS: Stable sequelae of CABG and mediastinal contours. Tortuous thoracic aorta. Stable lung volumes. No pneumothorax, pulmonary edema, pleural effusion or confluent pulmonary opacity. Mild eventration of the diaphragm. No acute osseous abnormality identified. Negative visible bowel gas pattern. IMPRESSION: No acute cardiopulmonary abnormality. Electronically Signed   By: Genevie Ann M.D.   On: 12/09/2016 12:43   US Venous Img Lower Unilateral Left  Result Date: 12/09/2016 CLINICAL DATA:  Bilateral lower extremity swelling greater on the left than on the right. No pain. Patient also reports weakness and chills and nausea vomiting since 2 a.m. today. EXAM: Left LOWER EXTREMITY VENOUS DOPPLER ULTRASOUND TECHNIQUE: Gray-scale sonography with graded compression, as well as color Doppler and duplex ultrasound were performed to evaluate the lower extremity deep venous systems from the level of the common femoral vein and including the common femoral, femoral, profunda femoral, popliteal and calf veins including the posterior tibial, peroneal and gastrocnemius veins when visible. The superficial great saphenous vein was also interrogated. Spectral Doppler was utilized to evaluate flow at rest and with distal augmentation maneuvers in the common femoral, femoral and popliteal veins. COMPARISON:  None. FINDINGS: Contralateral Common Femoral Vein: Respiratory phasicity is normal and symmetric with the  symptomatic side. No evidence of thrombus. Normal compressibility. Common Femoral Vein: No evidence of thrombus. Normal compressibility, respiratory phasicity and response to augmentation. Saphenofemoral Junction: No evidence of thrombus. Normal compressibility and flow on color Doppler imaging. Profunda Femoral Vein: No evidence of thrombus. Normal compressibility and flow on color Doppler imaging. Femoral Vein: No evidence of thrombus. Normal compressibility, respiratory phasicity and response to augmentation. Popliteal Vein: No evidence of thrombus. Normal compressibility, respiratory phasicity and response to augmentation. Calf Veins: Normal appearance of the posterior tibial vein. The peroneal vein could not be visualized. Superficial Great Saphenous Vein: No evidence of thrombus. Normal compressibility and flow on color Doppler imaging. Venous Reflux:  None. 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 Enlarged left inguinal lymph node measuring 4.4 x 1.2 cm. IMPRESSION: No evidence of DVT within the left lower extremity. Electronically Signed   By: David  [place] M.D.   On: 12/09/2016 13:43    EKG: Independently reviewed. Sinus tach no acute issues US done of leg neg for dvt  Assessment/Plan 76 yo male with fever  Principal Problem:   Febrile illness-cxr neg.  ua neg.  Flu neg. No rashes.  No source.  Cont vanc/zosyn for now.  Follow cultures.  May be viral.  APAP.  Active Problems:   OBSTRUCTIVE SLEEP APNEA- noted   Essential hypertension- resume home meds   CAD (coronary artery disease)- noted, serial trop.  If rises consider ACS.  Has no symptoms except a fever.    DVT prophylaxis:  scds Code Status:  full Family Communication:  Yes, multiple family members in room Disposition Plan:  Per day team Consults called:  none Admission status:  observation   DAVID,RACHAL A MD [facility]  If 7PM-7AM, please contact night-coverage [email or web address] Password  [email or web address]  12/09/2016, 9:14 PM

## 2016-12-09 NOTE — ED Notes (Signed)
Pt wife phone number, call when transferred. [phone] or [phone]

## 2016-12-09 NOTE — ED Notes (Signed)
Attempted to call wife at both phone numbers listed in chart -- no answer.

## 2016-12-09 NOTE — ED Provider Notes (Signed)
[place] [facility] Provider Note   CSN: [id number] Arrival date & time: 12/09/16  1155     History   Chief Complaint Chief Complaint  Patient presents with  . Weakness  . Fever    HPI [name] is a 76 y.o. male.  HPI Patient with fever and shaking chills that started yesterday evening. He reports generalized weakness. Had nausea and one episode of vomiting this morning. Denies shortness of breath, cough, chest pain, abdominal pain. Has not received flu shot this year. No known sick contacts. Denies urinary symptoms. Past Medical History:  Diagnosis Date  . CAD (coronary artery disease)    s/p cath in 2005 and CABG x4  . Cataract   . DJD (degenerative joint disease)   . DJD (degenerative joint disease)   . Fluid retention    mild  . H/O: gout   . History of blood clots   . HTN (hypertension)   . Hyperlipidemia   . Hyperlipidemia   . Myocardial infarction ([place]) 2009  . Obesity    with reduction  . OSA (obstructive sleep apnea)    AHI 98/hr  . Renal insufficiency   . Sleep apnea   . Stroke [facility]) 2000    Patient Active Problem List   Diagnosis Date Noted  . Febrile illness 12/09/2016  . OBESITY, UNSPECIFIED 05/31/2009  . HYPERLIPIDEMIA 05/30/2009  . OBSTRUCTIVE SLEEP APNEA 05/30/2009  . HYPERTENSION 05/30/2009  . CAD 05/30/2009    Past Surgical History:  Procedure Laterality Date  . CORONARY ARTERY BYPASS GRAFT    . left inguinal hernia repair         Home Medications    Prior to Admission medications   Medication Sig Start Date End Date Taking? Authorizing Provider  allopurinol (ZYLOPRIM) 100 MG tablet Take 100 mg by mouth daily.    Rogers Blocker, MD  amLODipine (NORVASC) 2.5 MG tablet TAKE ONE TABLET BY MOUTH ONCE DAILY. MUST HAVE APPOINTMENT BEFORE FURTHER REFILLS. 12/21/14   Fay Records, MD  aspirin 81 MG tablet Take 81 mg by mouth daily.      [provider]  furosemide (LASIX) 20 MG tablet Take 20 mg by mouth 2 (two) times  daily.      [provider]  lovastatin (MEVACOR) 20 MG tablet TAKE ONE TABLET BY MOUTH TWICE DAILY. MUST HAVE OFFICE VISIT BEFORE FURTHER REFILL. Patient taking differently: TAKE ONE 20 mg TABLET BY MOUTH TWICE DAILY. MUST HAVE OFFICE VISIT BEFORE FURTHER REFILL. 12/21/14   Fay Records, MD  metoprolol tartrate (LOPRESSOR) 25 MG tablet TAKE ONE TABLET BY MOUTH TWICE DAILY 11/12/14   Fay Records, MD  niacin (NIASPAN) 500 MG CR tablet Take 500 mg by mouth at bedtime.      [provider]  olmesartan (BENICAR) 40 MG tablet Take 40 mg by mouth daily.      [provider]    Family History Family History  Problem Relation Age of Onset  . Gout Mother   . Stroke Mother   . Coronary artery disease Mother   . Hypertension Mother   . Arthritis Mother   . Coronary artery disease Father   . Arthritis Father   . Gout Father   . Stroke Father   . Hypertension Father   . Coronary artery disease Brother   . Arthritis Brother   . Gout Brother   . Stroke Brother   . Hypertension Brother   . Coronary artery disease Brother   .  Arthritis Brother   . Gout Brother   . Stroke Brother   . Hypertension Brother   . Coronary artery disease Unknown        significant in multiple family members    Social History Social History  Substance Use Topics  . Smoking status: Former Smoker    Types: Cigars  . Smokeless tobacco: Never Used     Comment: used to smoke cigars, 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, none since  . Alcohol use No     Allergies   Ramipril   Review of Systems Review of Systems  Constitutional: Positive for chills, fatigue and fever.  HENT: Negative for congestion, rhinorrhea, sinus pain, sinus pressure and sore throat.   Eyes: Negative for photophobia and visual disturbance.  Respiratory: Negative for cough and shortness of breath.   Cardiovascular: Positive for leg swelling. Negative for chest pain and palpitations.  Gastrointestinal: Negative for  abdominal pain, diarrhea, nausea and vomiting.  Genitourinary: Negative for dysuria, flank pain and frequency.  Musculoskeletal: Negative for back pain, myalgias, neck pain and neck stiffness.  Skin: Negative for rash and wound.  Neurological: Positive for weakness (generalized). Negative for dizziness, light-headedness, numbness and headaches.  All other systems reviewed and are negative.    Physical Exam Updated Vital Signs BP (!) 156/92   Pulse (!) 111   Temp (!) 103.1 F (39.5 C) (Oral)   Resp (!) 30   Ht 6\' 2"  (1.88 m)   Wt 129.7 kg (286 lb)   SpO2 94%   BMI 36.72 kg/m   Physical Exam  Constitutional: He is oriented to person, place, and time. He appears well-developed and well-nourished.  HENT:  Head: Normocephalic and atraumatic.  Mouth/Throat: Oropharynx is clear and moist. No oropharyngeal exudate.  Eyes: Pupils are equal, round, and reactive to light. EOM are normal.  Neck: Normal range of motion. Neck supple.  No meningismus  Cardiovascular: Regular rhythm.   Tachycardia  Pulmonary/Chest:  Tachypnea, diminished breath sounds left base.  Abdominal: Soft. Bowel sounds are normal. There is no tenderness. There is no rebound and no guarding.  Musculoskeletal: Normal range of motion. He exhibits edema. He exhibits no tenderness.  Left greater than right lower extremity edema. Distal pulses intact. Question mild increase hyperpigmentation/erythema of the left leg compared to the right.  Lymphadenopathy:    He has cervical adenopathy.  Neurological: He is alert and oriented to person, place, and time.  Moves all extremities without focal deficit. Sensation intact.  Skin: Skin is warm and dry. Capillary refill takes less than 2 seconds. No rash noted. No erythema.  Psychiatric: He has a normal mood and affect. His behavior is normal.  Nursing note and vitals reviewed.    ED Treatments / Results  Labs (all labs ordered are listed, but only abnormal results are  displayed) Labs Reviewed  COMPREHENSIVE METABOLIC PANEL - Abnormal; Notable for the following:       Result Value   Glucose, Bld 114 (*)    BUN 30 (*)    Creatinine, Ser 1.49 (*)    ALT 16 (*)    GFR calc non Af Amer 44 (*)    GFR calc Af Amer 52 (*)    All other components within normal limits  CBC WITH DIFFERENTIAL/PLATELET - Abnormal; Notable for the following:    WBC 16.1 (*)    Hemoglobin 12.3 (*)    HCT 37.4 (*)    Neutro Abs 14.0 (*)    Monocytes Absolute  1.2 (*)    All other components within normal limits  URINALYSIS, ROUTINE W REFLEX MICROSCOPIC - Abnormal; Notable for the following:    Specific Gravity, Urine <1.005 (*)    All other components within normal limits  TROPONIN I - Abnormal; Notable for the following:    Troponin I 0.03 (*)    All other components within normal limits  I-STAT CG4 LACTIC ACID, ED - Abnormal; Notable for the following:    Lactic Acid, Venous 2.40 (*)    All other components within normal limits  CULTURE, BLOOD (ROUTINE X 2)  CULTURE, BLOOD (ROUTINE X 2)  BRAIN NATRIURETIC PEPTIDE  INFLUENZA PANEL BY PCR (TYPE A & B)  I-STAT CG4 LACTIC ACID, ED    EKG  EKG Interpretation  Date/Time:  Wednesday December 09 2016 12:38:38 EDT Ventricular Rate:  112 PR Interval:    QRS Duration: 92 QT Interval:  311 QTC Calculation: 425 R Axis:   55 Text Interpretation:  Sinus tachycardia Borderline prolonged PR interval Probable left atrial enlargement Borderline T wave abnormalities Confirmed by Julianne Rice [phone]) on 12/09/2016 2:51:47 PM       Radiology Dg Chest 2 View  Result Date: 12/09/2016 CLINICAL DATA:  76 year old male with weakness fever chills nausea and vomiting since last night. EXAM: CHEST  2 VIEW COMPARISON:  10/08/2008 and earlier. FINDINGS: Stable sequelae of CABG and mediastinal contours. Tortuous thoracic aorta. Stable lung volumes. No pneumothorax, pulmonary edema, pleural effusion or confluent pulmonary opacity. Mild  eventration of the diaphragm. No acute osseous abnormality identified. Negative visible bowel gas pattern. IMPRESSION: No acute cardiopulmonary abnormality. Electronically Signed   By: Genevie Ann M.D.   On: 12/09/2016 12:43   US Venous Img Lower Unilateral Left  Result Date: 12/09/2016 CLINICAL DATA:  Bilateral lower extremity swelling greater on the left than on the right. No pain. Patient also reports weakness and chills and nausea vomiting since 2 a.m. today. EXAM: Left LOWER EXTREMITY VENOUS DOPPLER ULTRASOUND TECHNIQUE: Gray-scale sonography with graded compression, as well as color Doppler and duplex ultrasound were performed to evaluate the lower extremity deep venous systems from the level of the common femoral vein and including the common femoral, femoral, profunda femoral, popliteal and calf veins including the posterior tibial, peroneal and gastrocnemius veins when visible. The superficial great saphenous vein was also interrogated. Spectral Doppler was utilized to evaluate flow at rest and with distal augmentation maneuvers in the common femoral, femoral and popliteal veins. COMPARISON:  None. FINDINGS: Contralateral Common Femoral Vein: Respiratory phasicity is normal and symmetric with the symptomatic side. No evidence of thrombus. Normal compressibility. Common Femoral Vein: No evidence of thrombus. Normal compressibility, respiratory phasicity and response to augmentation. Saphenofemoral Junction: No evidence of thrombus. Normal compressibility and flow on color Doppler imaging. Profunda Femoral Vein: No evidence of thrombus. Normal compressibility and flow on color Doppler imaging. Femoral Vein: No evidence of thrombus. Normal compressibility, respiratory phasicity and response to augmentation. Popliteal Vein: No evidence of thrombus. Normal compressibility, respiratory phasicity and response to augmentation. Calf Veins: Normal appearance of the posterior tibial vein. The peroneal vein could not  be visualized. Superficial Great Saphenous Vein: No evidence of thrombus. Normal compressibility and flow on color Doppler imaging. Venous Reflux:  None. 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 Enlarged left inguinal lymph node measuring 4.4 x 1.2 cm. IMPRESSION: No evidence of DVT within the left lower extremity. Electronically Signed   By: Nicky Milhouse  [place] M.D.   On: 12/09/2016 13:43  Procedures Procedures (including critical care time)  Medications Ordered in ED Medications  vancomycin (VANCOCIN) IVPB 1000 mg/200 mL premix (1,000 mg Intravenous New Bag/Given 12/09/16 1423)  acetaminophen (TYLENOL) tablet 650 mg (650 mg Oral Given 12/09/16 1217)  piperacillin-tazobactam (ZOSYN) IVPB 3.375 g (0 g Intravenous Stopped 12/09/16 1423)  sodium chloride 0.9 % bolus 500 mL (0 mLs Intravenous Stopped 12/09/16 1422)  sodium chloride 0.9 % bolus 500 mL (500 mLs Intravenous New Bag/Given 12/09/16 1422)  oseltamivir (TAMIFLU) capsule 75 mg (75 mg Oral Given 12/09/16 1422)     Initial Impression / Assessment and Plan / ED Course  I have reviewed the triage vital signs and the nursing notes.  Pertinent labs & imaging results that were available during my care of the patient were reviewed by me and considered in my medical decision making (see chart for details).     Mildly elevated lactic acid and white blood cell count. Ultrasound of the left leg without evidence of DVT. Chest x-ray without definite infiltrate. Started on IV fluids, broad-spectrum antibiotics and Tamiflu. Discussed with hospitalist, Dr. Marily Memos. Will accept in transfer to [facility].  Final Clinical Impressions(s) / ED Diagnoses   Final diagnoses:  Generalized weakness  Febrile illness, acute    New Prescriptions New Prescriptions   No medications on file     Julianne Rice, MD 12/09/16 1452

## 2016-12-09 NOTE — ED Notes (Signed)
Critical Lactic acid 2.21 reported to Dr. Lita Mains

## 2016-12-09 NOTE — ED Notes (Signed)
Date and time results received: 12/09/16 1307 (use smartphrase ".now" to insert current time)  Test: Trop Critical Value: 0.03  Name of Provider Notified: Lita Mains  Orders Received? Or Actions Taken?: no new orders given

## 2016-12-09 NOTE — ED Triage Notes (Signed)
C/o weakness, chills, n/v started 2am-presents to triage in w/c-NAD

## 2016-12-09 NOTE — ED Notes (Signed)
Patient's spouse called about where he was for transportation, advised her that [facility] was on the way and she will meet him at [facility].

## 2016-12-10 DIAGNOSIS — R748 Abnormal levels of other serum enzymes: Secondary | ICD-10-CM | POA: Diagnosis not present

## 2016-12-10 DIAGNOSIS — E872 Acidosis, unspecified: Secondary | ICD-10-CM

## 2016-12-10 DIAGNOSIS — N183 Chronic kidney disease, stage 3 (moderate): Secondary | ICD-10-CM | POA: Diagnosis present

## 2016-12-10 DIAGNOSIS — R778 Other specified abnormalities of plasma proteins: Secondary | ICD-10-CM | POA: Diagnosis present

## 2016-12-10 DIAGNOSIS — R509 Fever, unspecified: Secondary | ICD-10-CM | POA: Diagnosis not present

## 2016-12-10 DIAGNOSIS — R7881 Bacteremia: Secondary | ICD-10-CM | POA: Diagnosis not present

## 2016-12-10 DIAGNOSIS — Z8673 Personal history of transient ischemic attack (TIA), and cerebral infarction without residual deficits: Secondary | ICD-10-CM | POA: Diagnosis not present

## 2016-12-10 DIAGNOSIS — G4733 Obstructive sleep apnea (adult) (pediatric): Secondary | ICD-10-CM

## 2016-12-10 DIAGNOSIS — I10 Essential (primary) hypertension: Secondary | ICD-10-CM | POA: Diagnosis not present

## 2016-12-10 DIAGNOSIS — B955 Unspecified streptococcus as the cause of diseases classified elsewhere: Secondary | ICD-10-CM

## 2016-12-10 DIAGNOSIS — Z951 Presence of aortocoronary bypass graft: Secondary | ICD-10-CM | POA: Diagnosis not present

## 2016-12-10 DIAGNOSIS — I252 Old myocardial infarction: Secondary | ICD-10-CM | POA: Diagnosis not present

## 2016-12-10 DIAGNOSIS — A408 Other streptococcal sepsis: Secondary | ICD-10-CM | POA: Diagnosis present

## 2016-12-10 DIAGNOSIS — E785 Hyperlipidemia, unspecified: Secondary | ICD-10-CM

## 2016-12-10 DIAGNOSIS — I5032 Chronic diastolic (congestive) heart failure: Secondary | ICD-10-CM | POA: Diagnosis present

## 2016-12-10 DIAGNOSIS — I2510 Atherosclerotic heart disease of native coronary artery without angina pectoris: Secondary | ICD-10-CM | POA: Diagnosis present

## 2016-12-10 DIAGNOSIS — M109 Gout, unspecified: Secondary | ICD-10-CM | POA: Diagnosis present

## 2016-12-10 DIAGNOSIS — R7989 Other specified abnormal findings of blood chemistry: Secondary | ICD-10-CM

## 2016-12-10 DIAGNOSIS — A409 Streptococcal sepsis, unspecified: Secondary | ICD-10-CM

## 2016-12-10 DIAGNOSIS — Z87891 Personal history of nicotine dependence: Secondary | ICD-10-CM | POA: Diagnosis not present

## 2016-12-10 DIAGNOSIS — I5030 Unspecified diastolic (congestive) heart failure: Secondary | ICD-10-CM | POA: Diagnosis not present

## 2016-12-10 DIAGNOSIS — A419 Sepsis, unspecified organism: Secondary | ICD-10-CM

## 2016-12-10 DIAGNOSIS — M7989 Other specified soft tissue disorders: Secondary | ICD-10-CM

## 2016-12-10 DIAGNOSIS — R531 Weakness: Secondary | ICD-10-CM | POA: Diagnosis present

## 2016-12-10 DIAGNOSIS — I130 Hypertensive heart and chronic kidney disease with heart failure and stage 1 through stage 4 chronic kidney disease, or unspecified chronic kidney disease: Secondary | ICD-10-CM | POA: Diagnosis present

## 2016-12-10 DIAGNOSIS — Z86718 Personal history of other venous thrombosis and embolism: Secondary | ICD-10-CM | POA: Diagnosis not present

## 2016-12-10 DIAGNOSIS — N289 Disorder of kidney and ureter, unspecified: Secondary | ICD-10-CM | POA: Diagnosis not present

## 2016-12-10 LAB — MAGNESIUM: Magnesium: 1.8 mg/dL (ref 1.7–2.4)

## 2016-12-10 LAB — BLOOD CULTURE ID PANEL (REFLEXED)
Acinetobacter baumannii: NOT DETECTED
CANDIDA GLABRATA: NOT DETECTED
CANDIDA KRUSEI: NOT DETECTED
CANDIDA PARAPSILOSIS: NOT DETECTED
Candida albicans: NOT DETECTED
Candida tropicalis: NOT DETECTED
ENTEROCOCCUS SPECIES: NOT DETECTED
ESCHERICHIA COLI: NOT DETECTED
Enterobacter cloacae complex: NOT DETECTED
Enterobacteriaceae species: NOT DETECTED
Haemophilus influenzae: NOT DETECTED
KLEBSIELLA OXYTOCA: NOT DETECTED
Klebsiella pneumoniae: NOT DETECTED
LISTERIA MONOCYTOGENES: NOT DETECTED
Neisseria meningitidis: NOT DETECTED
PROTEUS SPECIES: NOT DETECTED
Pseudomonas aeruginosa: NOT DETECTED
SERRATIA MARCESCENS: NOT DETECTED
STAPHYLOCOCCUS AUREUS BCID: NOT DETECTED
STREPTOCOCCUS PYOGENES: NOT DETECTED
Staphylococcus species: NOT DETECTED
Streptococcus agalactiae: NOT DETECTED
Streptococcus pneumoniae: NOT DETECTED
Streptococcus species: DETECTED — AB

## 2016-12-10 LAB — CBC WITH DIFFERENTIAL/PLATELET
BASOS ABS: 0 10*3/uL (ref 0.0–0.1)
BASOS PCT: 0 %
Eosinophils Absolute: 0.1 10*3/uL (ref 0.0–0.7)
Eosinophils Relative: 1 %
HEMATOCRIT: 35.5 % — AB (ref 39.0–52.0)
HEMOGLOBIN: 11.4 g/dL — AB (ref 13.0–17.0)
Lymphocytes Relative: 6 %
Lymphs Abs: 1.2 10*3/uL (ref 0.7–4.0)
MCH: 27.1 pg (ref 26.0–34.0)
MCHC: 32.1 g/dL (ref 30.0–36.0)
MCV: 84.3 fL (ref 78.0–100.0)
MONOS PCT: 7 %
Monocytes Absolute: 1.3 10*3/uL — ABNORMAL HIGH (ref 0.1–1.0)
NEUTROS ABS: 16.7 10*3/uL — AB (ref 1.7–7.7)
NEUTROS PCT: 86 %
Platelets: 173 10*3/uL (ref 150–400)
RBC: 4.21 MIL/uL — AB (ref 4.22–5.81)
RDW: 15.7 % — ABNORMAL HIGH (ref 11.5–15.5)
WBC: 19.2 10*3/uL — AB (ref 4.0–10.5)

## 2016-12-10 LAB — TROPONIN I
Troponin I: 0.24 ng/mL (ref <0.03)
Troponin I: 0.31 ng/mL (ref <0.03)
Troponin I: 0.29 ng/mL (ref <0.03)
Troponin I: 0.21 ng/mL (ref <0.03)
Troponin I: 0.25 ng/mL (ref <0.03)

## 2016-12-10 LAB — LACTIC ACID, PLASMA
LACTIC ACID, VENOUS: 1 mmol/L (ref 0.5–1.9)
LACTIC ACID, VENOUS: 2 mmol/L — AB (ref 0.5–1.9)

## 2016-12-10 LAB — COMPREHENSIVE METABOLIC PANEL
ALBUMIN: 3 g/dL — AB (ref 3.5–5.0)
ALK PHOS: 45 U/L (ref 38–126)
ALT: 17 U/L (ref 17–63)
AST: 24 U/L (ref 15–41)
Anion gap: 7 (ref 5–15)
BILIRUBIN TOTAL: 1.1 mg/dL (ref 0.3–1.2)
BUN: 25 mg/dL — AB (ref 6–20)
CALCIUM: 9.1 mg/dL (ref 8.9–10.3)
CO2: 21 mmol/L — AB (ref 22–32)
Chloride: 113 mmol/L — ABNORMAL HIGH (ref 101–111)
Creatinine, Ser: 1.61 mg/dL — ABNORMAL HIGH (ref 0.61–1.24)
GFR calc Af Amer: 47 mL/min — ABNORMAL LOW (ref >60)
GFR calc non Af Amer: 40 mL/min — ABNORMAL LOW (ref >60)
GLUCOSE: 90 mg/dL (ref 65–99)
Potassium: 4.1 mmol/L (ref 3.5–5.1)
SODIUM: 141 mmol/L (ref 135–145)
TOTAL PROTEIN: 6.6 g/dL (ref 6.5–8.1)

## 2016-12-10 LAB — PHOSPHORUS: Phosphorus: 2.1 mg/dL — ABNORMAL LOW (ref 2.5–4.6)

## 2016-12-10 MED ORDER — SODIUM CHLORIDE 0.9 % IV BOLUS (SEPSIS): 500.0000 mL | Freq: Once | INTRAVENOUS | Status: DC

## 2016-12-10 MED ORDER — DEXTROSE 5 % IV SOLN
2.0000 g | INTRAVENOUS | Status: DC
  Administered 2016-12-10 – 2016-12-12 (×3): 2 g via INTRAVENOUS
  Filled 2016-12-10 (×4): qty 2

## 2016-12-10 MED ORDER — SODIUM CHLORIDE 0.9 % IV SOLN
INTRAVENOUS | Status: DC
  Administered 2016-12-10 – 2016-12-12 (×3): via INTRAVENOUS

## 2016-12-10 MED ORDER — HEPARIN SODIUM (PORCINE) 5000 UNIT/ML IJ SOLN
5000.0000 [IU] | Freq: Three times a day (TID) | INTRAMUSCULAR | Status: DC
  Administered 2016-12-10 – 2016-12-12 (×6): 5000 [IU] via SUBCUTANEOUS
  Filled 2016-12-10 (×6): qty 1

## 2016-12-10 MED ORDER — PRAVASTATIN SODIUM 20 MG PO TABS
20.0000 mg | ORAL_TABLET | Freq: Every day | ORAL | Status: DC
  Administered 2016-12-10 – 2016-12-11 (×2): 20 mg via ORAL
  Filled 2016-12-10 (×2): qty 1

## 2016-12-10 NOTE — Care Management Obs Status (Signed)
[place] NOTIFICATION   Patient Details  Name: MORONI NESTER MRN: [id number] Date of Birth: 1940/10/30   Medicare Observation Status Notification Given:  Yes    Dana Debo, Rory Percy, RN 12/10/2016, 4:09 PM

## 2016-12-10 NOTE — Progress Notes (Signed)
PROGRESS NOTE    [name]  KYH:[id number] DOB: 20-Feb-1941 DOA: 12/09/2016 PCP: Rogers Blocker, MD   Brief Narrative:  [name] is a 76 y.o. male with medical history significant of CAD, HTN, OSA, HLD comes in with over a day of chills and subjective fever.  No rash.  No headache.  No n/v/d.  Well vomited once this am only.  No urinary symptoms such as dysuria or hematuria.  No cough.  No sob.  No pain in chest or abdomen.  No nasal congestion or any uri symptoms.  Went to urgent care found to have high temp over 103 and for some reason a troponin was checked which was 0.03.  Therefore pt was referred for admission for possible ACS. Patient found to be septic with a Streptococcus Bacteremia with no evident source. Discussed Case with ID and will need a TEE. Of note Patient had a Hx of Streptococcus Bacteremia in 2010 with a Abscess in Thigh.   Assessment & Plan:   Principal Problem:   Bacteremia due to Streptococcus Active Problems:   Hyperlipidemia   Obstructive sleep apnea   Essential hypertension   CAD (coronary artery disease)   Febrile illness   Febrile illness, acute   Sepsis (HCC)   Lactic acidosis   Elevated troponin   Renal insufficiency   Hypophosphatemia   Left leg swelling  Sepsis 2/2 to Streptococcus Bacteremia -Patient was Febrile at (103.1), Tachycardic, Tachypenic, and had a Leukocytosis and now has a Gram Positive Cocci Bacteremia -Blood Cx x2 Positive -Changed IV Vanc and Zosyn to IV Ceftriaxone 2 grams q24h -Discussed case with Dr. Tommy Medal of Infectious Diseases and recommends obtaining a TEE; Will consult Cardiology for TEE -Likely will get Formal Consult from Infectious Diseases  -Check ECHOCARDIOGRAM; Obtain TEE -WBC went from 16.1 -> 19.2 -Repeat Blood Cx and follow  -CXR showed Stable sequelae of CABG and mediastinal contours. Tortuous thoracic aorta. Stable lung volumes. No pneumothorax, pulmonary edema, pleural effusion or confluent pulmonary  opacity. Mild eventration of the diaphragm. No acute osseous abnormality identified. Negative visible bowel gas pattern -Urinalysis was Clear   Lactic Acidosis -Lactic Acid Level went from 2.40 -> 2.20 -> 1.0 -> 2.0 -C/w IVF NS at 100 mL/hr; Gave a Bolus of NS at 500 mL  Elevated Troponin -Elevated from 0.03 -> 0.24 -> 0.31 -> 0.29 -Denies Chest Pain -Likely in the Setting of Sepsis   Obstructive Sleep Apnea -C/w Home CPAP  Renal Insufficiency -Unknown Baseline -BUN/Cr went from 30/1.49 -> 25/1.61 -C/w IVF with NS at 100 mL/hr -Avoid Nephrotoxics if possible and Hold Lasix, Olmesartan, and Allopurinol Currently -Repeat CMP in AM   Hypophosphatemia -Patient's Phos Level was 2.1 -Replete with IV KPhos 20 mmol -Continue to Monitor and Replete as Necessary -Replete Phos Level in AM   Hx of CAD s/p CABGx4 -C/w ASA 81 mg po Daily, Metoprolol 25 mg po BID, and Pravastatin 20 mg po Daily -Troponin Trending down and likely 2/2 to Sepsis -Obtaining TTE and TEE  Hypertension -BP Currently Controlled -Held Amlodipine 2.5 mg po Daily, Olmesartam 40 mg po Daily, Lasix 20 mg po BID -If Necessary Add IV Hydralazine   HLD -C/w Lovastatin 20 mg po Daily with Pharmacy Subsitution   Gout -Hold Allopurinol currently   Left Leg Swelling -U/S done and showed no evidence of DVT within the LLE -No Erythema on Leg   DVT prophylaxis: SCDs; Add Heparin 5,000 sq q8h  Code Status: FULL CODE Family Communication:  Discussed with Wife at Bedside Disposition Plan: Remain Inpatient for Treatment of Bacteremia  Consultants:   Discussed Case with Infectious Diseases Dr. Tommy Medal  Will get Cardiology to evaluate for TEE   Procedures: ECHOCARDIOGRAM ordered  TEE to be done by Cardiology   Antimicrobials:  Anti-infectives    Start     Dose/Rate Route Frequency Ordered Stop   12/10/16 2100  vancomycin (VANCOCIN) 1,250 mg in sodium chloride 0.9 % 250 mL IVPB  Status:  Discontinued      1,250 mg 166.7 mL/hr over 90 Minutes Intravenous Every 24 hours 12/09/16 2134 12/10/16 0908   12/10/16 1200  cefTRIAXone (ROCEPHIN) 2 g in dextrose 5 % 50 mL IVPB     2 g 100 mL/hr over 30 Minutes Intravenous Every 24 hours 12/10/16 0908     12/09/16 2200  piperacillin-tazobactam (ZOSYN) IVPB 3.375 g  Status:  Discontinued     3.375 g 12.5 mL/hr over 240 Minutes Intravenous Every 8 hours 12/09/16 2133 12/10/16 0908   12/09/16 2145  vancomycin (VANCOCIN) 1,500 mg in sodium chloride 0.9 % 500 mL IVPB     1,500 mg 250 mL/hr over 120 Minutes Intravenous  Once 12/09/16 2134 12/09/16 2354   12/09/16 1430  oseltamivir (TAMIFLU) capsule 75 mg     75 mg Oral  Once 12/09/16 1416 12/09/16 1422   12/09/16 1245  piperacillin-tazobactam (ZOSYN) IVPB 3.375 g     3.375 g 100 mL/hr over 30 Minutes Intravenous  Once 12/09/16 1235 12/09/16 1423   12/09/16 1245  vancomycin (VANCOCIN) IVPB 1000 mg/200 mL premix     1,000 mg 200 mL/hr over 60 Minutes Intravenous  Once 12/09/16 1235 12/09/16 1554     Subjective: Seen and examined at bedside and was feeling better. No CP or SOB. No Dysuria or urinary discomfort. Denied any skin rashes or pain. No lightheadedness or dizziness.   Objective: Vitals:   12/09/16 1830 12/09/16 2004 12/10/16 0653 12/10/16 0900  BP: 123/67 130/69 (!) 153/86 126/67  Pulse: (!) 121 (!) 107 83 83  Resp: 19 20 19 20   Temp:  98.8 F (37.1 C) 99.4 F (37.4 C) 99.8 F (37.7 C)  TempSrc:  Oral Oral Oral  SpO2: 97% 97% 98% 99%  Weight:  129.5 kg (285 lb 7.9 oz)    Height:  6\' 2"  (1.88 m)      Intake/Output Summary (Last 24 hours) at 12/10/16 1530 Last data filed at 12/10/16 0900  Gross per 24 hour  Intake             1250 ml  Output              700 ml  Net              550 ml   Filed Weights   12/09/16 1201 12/09/16 2004  Weight: 129.7 kg (286 lb) 129.5 kg (285 lb 7.9 oz)   Examination: Physical Exam:  Constitutional: WN/WD obese AAM in NAD and appears calm and  comfortable Eyes: Lids and conjunctivae normal, sclerae anicteric  ENMT: External Ears, Nose appear normal. Grossly normal hearing. Mucous membranes are moist.  Neck: Appears normal, supple, no cervical masses, normal ROM, no appreciable thyromegaly, no JVD Respiratory: Clear to auscultation bilaterally, no wheezing, rales, rhonchi or crackles. Normal respiratory effort and patient is not tachypenic. No accessory muscle use.  Cardiovascular: RRR, no murmurs / rubs / gallops. S1 and S2 auscultated. Has Lower Extremity Edema in the Left more than the Right Abdomen: Soft,  non-tender, non-distended. No masses palpated. No appreciable hepatosplenomegaly. Bowel sounds positive.  GU: Deferred. Musculoskeletal: No clubbing / cyanosis of digits/nails. No joint deformity upper and lower extremities. Good ROM, no contractures. Skin: No rashes, lesions, ulcers on a limited skin eval. No induration; Warm and dry.  Neurologic: CN 2-12 grossly intact with no focal deficits. Sensation intact in all 4 Extremities. Romberg sign cerebellar reflexes not assessed.  Psychiatric: Normal judgment and insight. Alert and oriented x 3. Normal mood and appropriate affect.   Data Reviewed: I have personally reviewed following labs and imaging studies  CBC:  Recent Labs Lab 12/09/16 1209 12/10/16 0757  WBC 16.1* 19.2*  NEUTROABS 14.0* 16.7*  HGB 12.3* 11.4*  HCT 37.4* 35.5*  MCV 84.0 84.3  PLT 197 678   Basic Metabolic Panel:  Recent Labs Lab 12/09/16 1209 12/10/16 0757  NA 138 141  K 4.5 4.1  CL 108 113*  CO2 22 21*  GLUCOSE 114* 90  BUN 30* 25*  CREATININE 1.49* 1.61*  CALCIUM 9.5 9.1  MG  --  1.8  PHOS  --  2.1*   GFR: Estimated Creatinine Clearance: 57.6 mL/min (A) (by C-G formula based on SCr of 1.61 mg/dL (H)). Liver Function Tests:  Recent Labs Lab 12/09/16 1209 12/10/16 0757  AST 29 24  ALT 16* 17  ALKPHOS 54 45  BILITOT 0.5 1.1  PROT 8.0 6.6  ALBUMIN 3.8 3.0*   No results for  input(s): LIPASE, AMYLASE in the last 168 hours. No results for input(s): AMMONIA in the last 168 hours. Coagulation Profile: No results for input(s): INR, PROTIME in the last 168 hours. Cardiac Enzymes:  Recent Labs Lab 12/09/16 1209 12/09/16 2316 12/10/16 0347 12/10/16 0757  TROPONINI 0.03* 0.24* 0.31* 0.29*   BNP (last 3 results) No results for input(s): PROBNP in the last 8760 hours. HbA1C: No results for input(s): HGBA1C in the last 72 hours. CBG: No results for input(s): GLUCAP in the last 168 hours. Lipid Profile: No results for input(s): CHOL, HDL, LDLCALC, TRIG, CHOLHDL, LDLDIRECT in the last 72 hours. Thyroid Function Tests: No results for input(s): TSH, T4TOTAL, FREET4, T3FREE, THYROIDAB in the last 72 hours. Anemia Panel: No results for input(s): VITAMINB12, FOLATE, FERRITIN, TIBC, IRON, RETICCTPCT in the last 72 hours. Sepsis Labs:  Recent Labs Lab 12/09/16 1222 12/09/16 1506 12/10/16 0757 12/10/16 1037  LATICACIDVEN 2.40* 2.20* 1.0 2.0*    Recent Results (from the past 240 hour(s))  Culture, blood (Routine X 2) w Reflex to ID Panel     Status: None (Preliminary result)   Collection Time: 12/09/16 12:10 PM  Result Value Ref Range Status   Specimen Description BLOOD RIGHT ANTECUBITAL  Final   Special Requests   Final    BOTTLES DRAWN AEROBIC AND ANAEROBIC Blood Culture adequate volume   Culture  Setup Time   Final    GRAM POSITIVE COCCI IN CHAINS IN BOTH AEROBIC AND ANAEROBIC BOTTLES CRITICAL RESULT CALLED TO, READ BACK BY AND VERIFIED WITH: [place], [place] AT [place] ON 12/10/16 BY C. JESSUP, MLT. Performed at [facility], [place] [address] [place], [place] [address]    Culture GRAM POSITIVE COCCI  Final   Report Status PENDING  Incomplete  Culture, blood (Routine X 2) w Reflex to ID Panel     Status: None (Preliminary result)   Collection Time: 12/09/16 12:45 PM  Result Value Ref Range Status   Specimen Description BLOOD LEFT ANTECUBITAL  Final    Special Requests   Final  BOTTLES DRAWN AEROBIC AND ANAEROBIC Blood Culture results may not be optimal due to an excessive volume of blood received in culture bottles   Culture  Setup Time   Final    GRAM POSITIVE COCCI IN CHAINS IN BOTH AEROBIC AND ANAEROBIC BOTTLES CRITICAL RESULT CALLED TO, READ BACK BY AND VERIFIED WITH: Hughie Closs, [place] AT [phone] ON 12/10/16 BY C. JESSUP, MLT. Performed at [facility], [place] [address] [place], [place] [address]    Culture GRAM POSITIVE COCCI  Final   Report Status PENDING  Incomplete  Blood Culture ID Panel (Reflexed)     Status: Abnormal   Collection Time: 12/09/16 12:45 PM  Result Value Ref Range Status   Enterococcus species NOT DETECTED NOT DETECTED Final   Listeria monocytogenes NOT DETECTED NOT DETECTED Final   Staphylococcus species NOT DETECTED NOT DETECTED Final   Staphylococcus aureus NOT DETECTED NOT DETECTED Final   Streptococcus species DETECTED (A) NOT DETECTED Final    Comment: Not Enterococcus species, Streptococcus agalactiae, Streptococcus pyogenes, or Streptococcus pneumoniae. CRITICAL RESULT CALLED TO, READ BACK BY AND VERIFIED WITH: Hughie Closs, RPHARMD AT [phone] ON 12/10/16 BY C. JESSUP, MLT.    Streptococcus agalactiae NOT DETECTED NOT DETECTED Final   Streptococcus pneumoniae NOT DETECTED NOT DETECTED Final   Streptococcus pyogenes NOT DETECTED NOT DETECTED Final   Acinetobacter baumannii NOT DETECTED NOT DETECTED Final   Enterobacteriaceae species NOT DETECTED NOT DETECTED Final   Enterobacter cloacae complex NOT DETECTED NOT DETECTED Final   Escherichia coli NOT DETECTED NOT DETECTED Final   Klebsiella oxytoca NOT DETECTED NOT DETECTED Final   Klebsiella pneumoniae NOT DETECTED NOT DETECTED Final   Proteus species NOT DETECTED NOT DETECTED Final   Serratia marcescens NOT DETECTED NOT DETECTED Final   Haemophilus influenzae NOT DETECTED NOT DETECTED Final   Neisseria meningitidis NOT DETECTED NOT DETECTED Final     Pseudomonas aeruginosa NOT DETECTED NOT DETECTED Final   Candida albicans NOT DETECTED NOT DETECTED Final   Candida glabrata NOT DETECTED NOT DETECTED Final   Candida krusei NOT DETECTED NOT DETECTED Final   Candida parapsilosis NOT DETECTED NOT DETECTED Final   Candida tropicalis NOT DETECTED NOT DETECTED Final    Comment: Performed at [facility], [place] [address], [place], [place] [address]    Radiology Studies: Dg Chest 2 View  Result Date: 12/09/2016 CLINICAL DATA:  76 year old male with weakness fever chills nausea and vomiting since last night. EXAM: CHEST  2 VIEW COMPARISON:  10/08/2008 and earlier. FINDINGS: Stable sequelae of CABG and mediastinal contours. Tortuous thoracic aorta. Stable lung volumes. No pneumothorax, pulmonary edema, pleural effusion or confluent pulmonary opacity. Mild eventration of the diaphragm. No acute osseous abnormality identified. Negative visible bowel gas pattern. IMPRESSION: No acute cardiopulmonary abnormality. Electronically Signed   By: Genevie Ann M.D.   On: 12/09/2016 12:43   US Venous Img Lower Unilateral Left  Result Date: 12/09/2016 CLINICAL DATA:  Bilateral lower extremity swelling greater on the left than on the right. No pain. Patient also reports weakness and chills and nausea vomiting since 2 a.m. today. EXAM: Left LOWER EXTREMITY VENOUS DOPPLER ULTRASOUND TECHNIQUE: Gray-scale sonography with graded compression, as well as color Doppler and duplex ultrasound were performed to evaluate the lower extremity deep venous systems from the level of the common femoral vein and including the common femoral, femoral, profunda femoral, popliteal and calf veins including the posterior tibial, peroneal and gastrocnemius veins when visible. The superficial great saphenous vein was also interrogated.  Spectral Doppler was utilized to evaluate flow at rest and with distal augmentation maneuvers in the common femoral, femoral and popliteal veins. COMPARISON:   None. FINDINGS: Contralateral Common Femoral Vein: Respiratory phasicity is normal and symmetric with the symptomatic side. No evidence of thrombus. Normal compressibility. Common Femoral Vein: No evidence of thrombus. Normal compressibility, respiratory phasicity and response to augmentation. Saphenofemoral Junction: No evidence of thrombus. Normal compressibility and flow on color Doppler imaging. Profunda Femoral Vein: No evidence of thrombus. Normal compressibility and flow on color Doppler imaging. Femoral Vein: No evidence of thrombus. Normal compressibility, respiratory phasicity and response to augmentation. Popliteal Vein: No evidence of thrombus. Normal compressibility, respiratory phasicity and response to augmentation. Calf Veins: Normal appearance of the posterior tibial vein. The peroneal vein could not be visualized. Superficial Great Saphenous Vein: No evidence of thrombus. Normal compressibility and flow on color Doppler imaging. Venous Reflux:  None. 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 Enlarged left inguinal lymph node measuring 4.4 x 1.2 cm. IMPRESSION: No evidence of DVT within the left lower extremity. Electronically Signed   By: David  Martinique M.D.   On: 12/09/2016 13:43   Scheduled Meds: . aspirin EC  81 mg Oral Daily  . heparin subcutaneous  5,000 Units Subcutaneous Q8H  . metoprolol tartrate  25 mg Oral BID  . pravastatin  20 mg Oral q1800  . sodium chloride flush  3 mL Intravenous Q12H   Continuous Infusions: . sodium chloride    . sodium chloride 100 mL/hr at 12/10/16 0852  . cefTRIAXone (ROCEPHIN)  IV 2 g (12/10/16 1328)  . sodium chloride      LOS: 0 days   Kerney Elbe, DO [facility] Pager [phone]  If 7PM-7AM, please contact night-coverage [email or web address] Password [email or web address] 12/10/2016, 3:30 PM

## 2016-12-10 NOTE — Progress Notes (Signed)
PHARMACY - PHYSICIAN COMMUNICATION CRITICAL VALUE ALERT - BLOOD CULTURE IDENTIFICATION (BCID)  Results for orders placed or performed during the hospital encounter of 12/09/16  Blood Culture ID Panel (Reflexed) (Collected: 12/09/2016 12:45 PM)  Result Value Ref Range   Enterococcus species NOT DETECTED NOT DETECTED   Listeria monocytogenes NOT DETECTED NOT DETECTED   Staphylococcus species NOT DETECTED NOT DETECTED   Staphylococcus aureus NOT DETECTED NOT DETECTED   Streptococcus species DETECTED (A) NOT DETECTED   Streptococcus agalactiae NOT DETECTED NOT DETECTED   Streptococcus pneumoniae NOT DETECTED NOT DETECTED   Streptococcus pyogenes NOT DETECTED NOT DETECTED   Acinetobacter baumannii NOT DETECTED NOT DETECTED   Enterobacteriaceae species NOT DETECTED NOT DETECTED   Enterobacter cloacae complex NOT DETECTED NOT DETECTED   Escherichia coli NOT DETECTED NOT DETECTED   Klebsiella oxytoca NOT DETECTED NOT DETECTED   Klebsiella pneumoniae NOT DETECTED NOT DETECTED   Proteus species NOT DETECTED NOT DETECTED   Serratia marcescens NOT DETECTED NOT DETECTED   Haemophilus influenzae NOT DETECTED NOT DETECTED   Neisseria meningitidis NOT DETECTED NOT DETECTED   Pseudomonas aeruginosa NOT DETECTED NOT DETECTED   Candida albicans NOT DETECTED NOT DETECTED   Candida glabrata NOT DETECTED NOT DETECTED   Candida krusei NOT DETECTED NOT DETECTED   Candida parapsilosis NOT DETECTED NOT DETECTED   Candida tropicalis NOT DETECTED NOT DETECTED    Name of physician (or Provider) Contacted: Dr. Alfredia Ferguson  Changes to prescribed antibiotics required: D/c Vancomycin and Zosyn and narrow to Rocephin 2g IV every 24 hours  Lawson Radar 12/10/2016  9:07 AM

## 2016-12-10 NOTE — Progress Notes (Signed)
    [facility] has been requested to perform a transesophageal echocardiogram on [name] for bacteremia.  After careful review of history and examination, the risks and benefits of transesophageal echocardiogram have been explained including risks of esophageal damage, perforation (1:10,000 risk), bleeding, pharyngeal hematoma as well as other potential complications associated with conscious sedation including aspiration, arrhythmia, respiratory failure and death. Alternatives to treatment were discussed, questions were answered. Patient is willing to proceed.   Pt is scheduled for TEE tomorrow 12/11/16 at 1600. NPO at [place] tonight.  Tami Lin [place], [place]  12/10/2016 3:56 PM

## 2016-12-10 NOTE — Progress Notes (Signed)
CRITICAL VALUE ALERT  Critical value received:  Troponin = 0.24  Date of notification:  12/10/16  Time of notification:  0375  Critical value read back:Yes.    Nurse who received alert:  Arthor Captain  MD notified (1st page):  Raliegh Ip Schorr  Time of first page:  [phone]

## 2016-12-11 ENCOUNTER — Inpatient Hospital Stay (HOSPITAL_COMMUNITY): Payer: Medicare Other

## 2016-12-11 ENCOUNTER — Encounter (HOSPITAL_COMMUNITY)
Admission: EM | Disposition: A | Discharge status: Home Health | Payer: Self-pay | Source: Home / Self Care | Attending: Internal Medicine

## 2016-12-11 ENCOUNTER — Encounter (HOSPITAL_COMMUNITY): Payer: Self-pay | Admitting: *Deleted

## 2016-12-11 DIAGNOSIS — R7881 Bacteremia: Secondary | ICD-10-CM

## 2016-12-11 DIAGNOSIS — I5030 Unspecified diastolic (congestive) heart failure: Secondary | ICD-10-CM

## 2016-12-11 DIAGNOSIS — R531 Weakness: Secondary | ICD-10-CM

## 2016-12-11 HISTORY — PX: TEE WITHOUT CARDIOVERSION: SHX5443

## 2016-12-11 LAB — COMPREHENSIVE METABOLIC PANEL
ALT: 14 U/L — ABNORMAL LOW (ref 17–63)
AST: 22 U/L (ref 15–41)
Albumin: 2.7 g/dL — ABNORMAL LOW (ref 3.5–5.0)
Alkaline Phosphatase: 49 U/L (ref 38–126)
Anion gap: 8 (ref 5–15)
BUN: 20 mg/dL (ref 6–20)
CALCIUM: 9 mg/dL (ref 8.9–10.3)
CO2: 21 mmol/L — AB (ref 22–32)
Chloride: 110 mmol/L (ref 101–111)
Creatinine, Ser: 1.55 mg/dL — ABNORMAL HIGH (ref 0.61–1.24)
GFR calc Af Amer: 49 mL/min — ABNORMAL LOW (ref >60)
GFR, EST NON AFRICAN AMERICAN: 42 mL/min — AB (ref >60)
Glucose, Bld: 136 mg/dL — ABNORMAL HIGH (ref 65–99)
Potassium: 3.8 mmol/L (ref 3.5–5.1)
SODIUM: 139 mmol/L (ref 135–145)
TOTAL PROTEIN: 6.2 g/dL — AB (ref 6.5–8.1)
Total Bilirubin: 0.4 mg/dL (ref 0.3–1.2)

## 2016-12-11 LAB — CBC WITH DIFFERENTIAL/PLATELET
BASOS ABS: 0 10*3/uL (ref 0.0–0.1)
Basophils Relative: 0 %
EOS ABS: 0.2 10*3/uL (ref 0.0–0.7)
Eosinophils Relative: 2 %
HCT: 30.4 % — ABNORMAL LOW (ref 39.0–52.0)
Hemoglobin: 10.2 g/dL — ABNORMAL LOW (ref 13.0–17.0)
Lymphocytes Relative: 12 %
Lymphs Abs: 1.8 10*3/uL (ref 0.7–4.0)
MCH: 28 pg (ref 26.0–34.0)
MCHC: 33.6 g/dL (ref 30.0–36.0)
MCV: 83.5 fL (ref 78.0–100.0)
MONO ABS: 1.5 10*3/uL — AB (ref 0.1–1.0)
Monocytes Relative: 10 %
Neutro Abs: 11.3 10*3/uL — ABNORMAL HIGH (ref 1.7–7.7)
Neutrophils Relative %: 76 %
PLATELETS: 172 10*3/uL (ref 150–400)
RBC: 3.64 MIL/uL — AB (ref 4.22–5.81)
RDW: 15.7 % — AB (ref 11.5–15.5)
WBC: 14.8 10*3/uL — AB (ref 4.0–10.5)

## 2016-12-11 LAB — BASIC METABOLIC PANEL
Anion gap: 5 (ref 5–15)
BUN: 16 mg/dL (ref 6–20)
CALCIUM: 9.1 mg/dL (ref 8.9–10.3)
CO2: 22 mmol/L (ref 22–32)
CREATININE: 1.4 mg/dL — AB (ref 0.61–1.24)
Chloride: 113 mmol/L — ABNORMAL HIGH (ref 101–111)
GFR calc Af Amer: 56 mL/min — ABNORMAL LOW (ref >60)
GFR, EST NON AFRICAN AMERICAN: 48 mL/min — AB (ref >60)
Glucose, Bld: 95 mg/dL (ref 65–99)
Potassium: 4 mmol/L (ref 3.5–5.1)
SODIUM: 140 mmol/L (ref 135–145)

## 2016-12-11 LAB — PROTIME-INR
INR: 1.3
Prothrombin Time: 16.1 seconds — ABNORMAL HIGH (ref 11.4–15.2)

## 2016-12-11 LAB — MAGNESIUM: MAGNESIUM: 1.8 mg/dL (ref 1.7–2.4)

## 2016-12-11 LAB — ECHOCARDIOGRAM COMPLETE
Height: 74 in
Weight: 4687.86 oz

## 2016-12-11 LAB — PHOSPHORUS: PHOSPHORUS: 1.7 mg/dL — AB (ref 2.5–4.6)

## 2016-12-11 SURGERY — ECHOCARDIOGRAM, TRANSESOPHAGEAL
Anesthesia: Moderate Sedation

## 2016-12-11 MED ORDER — MIDAZOLAM HCL 10 MG/2ML IJ SOLN
INTRAMUSCULAR | Status: DC | PRN
  Administered 2016-12-11: 2 mg via INTRAVENOUS
  Administered 2016-12-11: 1 mg via INTRAVENOUS

## 2016-12-11 MED ORDER — MIDAZOLAM HCL 5 MG/ML IJ SOLN
INTRAMUSCULAR | Status: AC
  Filled 2016-12-11: qty 2

## 2016-12-11 MED ORDER — FENTANYL CITRATE (PF) 100 MCG/2ML IJ SOLN
INTRAMUSCULAR | Status: AC
  Filled 2016-12-11: qty 2

## 2016-12-11 MED ORDER — SODIUM CHLORIDE 0.9 % IV SOLN: INTRAVENOUS | Status: DC

## 2016-12-11 MED ORDER — BUTAMBEN-TETRACAINE-BENZOCAINE 2-2-14 % EX AERO
INHALATION_SPRAY | CUTANEOUS | Status: DC | PRN
  Administered 2016-12-11: 2 via TOPICAL

## 2016-12-11 MED ORDER — FENTANYL CITRATE (PF) 100 MCG/2ML IJ SOLN
INTRAMUSCULAR | Status: DC | PRN
  Administered 2016-12-11: 25 ug via INTRAVENOUS

## 2016-12-11 MED ORDER — LIDOCAINE VISCOUS 2 % MT SOLN
OROMUCOSAL | Status: AC
  Filled 2016-12-11: qty 15

## 2016-12-11 MED ORDER — LIDOCAINE VISCOUS 2 % MT SOLN
OROMUCOSAL | Status: DC | PRN
  Administered 2016-12-11: 10 mL via OROMUCOSAL

## 2016-12-11 MED ORDER — DEXTROSE 5 % IV SOLN
30.0000 mmol | Freq: Once | INTRAVENOUS | Status: AC
  Administered 2016-12-11: 30 mmol via INTRAVENOUS
  Filled 2016-12-11: qty 10

## 2016-12-11 NOTE — Consult Note (Addendum)
Date of Admission:  12/09/2016          Reason for Consult: Streptococcal bacteremia with no clear source   Referring Provider: Dr. Alfredia Ferguson  Assessment: 1. Group G Streptococcal bacteremia 2. Prior Group G streptococcal bacteremia due to LLE cellulitis in 2010 3. Left leg tenderness over tibia  Plan: 1. Continue rocephin 2. Follow-up blood cultures 3. Obtain transesophageal echocardiogram 4. MRI without contrast of left tibia fibula 5. Repeat blood cultures been ordered  Principal Problem:   Bacteremia due to Streptococcus Active Problems:   Hyperlipidemia   Obstructive sleep apnea   Essential hypertension   CAD (coronary artery disease)   Febrile illness   Febrile illness, acute   Sepsis (HCC)   Lactic acidosis   Elevated troponin   Renal insufficiency   Hypophosphatemia   Left leg swelling   Scheduled Meds: . aspirin EC  81 mg Oral Daily  . heparin subcutaneous  5,000 Units Subcutaneous Q8H  . metoprolol tartrate  25 mg Oral BID  . pravastatin  20 mg Oral q1800  . sodium chloride flush  3 mL Intravenous Q12H   Continuous Infusions: . sodium chloride    . sodium chloride 100 mL/hr at 12/10/16 2014  . cefTRIAXone (ROCEPHIN)  IV Stopped (12/11/16 1127)  . potassium PHOSPHATE IVPB (mmol) 30 mmol (12/11/16 1410)  . sodium chloride     PRN Meds:.sodium chloride, acetaminophen **OR** acetaminophen, ondansetron **OR** ondansetron (ZOFRAN) IV, sodium chloride flush  HPI: [name] is a 76 y.o. male with past mental history significant for coronary artery disease status post coronary bypass grafting, hypertension sleep apnea hyperlipidemia, prior group G streptococcal bacteremia associated with left lower externally cellulitis presents to the emerge department with acute onset of sudden fevers to [address]'s malaise. He was evaluated and had a mild elevation in his troponin to 0.03. He is admitted to hospitalist service and blood cultures were drawn. He was  initiated on vancomycin and Zosyn which was narrowed to ceftriaxone when [facility] ID identified a streptococcal species. Today at the time of dictating this note his organism has grown out in culture has been identified again as being a group G streptococcal species. He has undergone transesophageal echocardiogram which is failed to show evidence of endocarditis.  On exam the only localizing symptom that he has is some tenderness over his tibia which he says has worsened in the last few days. This is the same leg where he previously was diagnosed with cellulitis.  Given negative transesophageal echocardiogram, tenderness about the left leg and history of "cellulitis" in that area will get an MRI of the tibia fibula to look for deep infection such as osteomyelitis that could've been a nidus for his recurrent bacteremia.   Review of Systems: Review of Systems  Constitutional: Positive for chills, diaphoresis, fever and malaise/fatigue. Negative for weight loss.  HENT: Negative for congestion, hearing loss, sore throat and tinnitus.   Eyes: Negative for blurred vision and double vision.  Respiratory: Negative for cough, sputum production, shortness of breath and wheezing.   Cardiovascular: Negative for chest pain, palpitations and leg swelling.  Gastrointestinal: Negative for abdominal pain, blood in stool, constipation, diarrhea, heartburn, melena, nausea and vomiting.  Genitourinary: Negative for dysuria, flank pain and hematuria.  Musculoskeletal: Positive for joint pain and myalgias. Negative for back pain and falls.  Skin: Negative for itching and rash.  Neurological: Positive for weakness. Negative for dizziness, sensory change, focal weakness, loss of consciousness and headaches.  Endo/Heme/Allergies:  Does not bruise/bleed easily.  Psychiatric/Behavioral: Negative for depression, memory loss and suicidal ideas. The patient is not nervous/anxious.     Past Medical History:  Diagnosis Date  .  CAD (coronary artery disease)    s/p cath in 2005 and CABG x4  . Cataract   . DJD (degenerative joint disease)   . DJD (degenerative joint disease)   . Fluid retention    mild  . H/O: gout   . History of blood clots   . HTN (hypertension)   . Hyperlipidemia   . Hyperlipidemia   . Myocardial infarction ([place]) 2009  . Obesity    with reduction  . OSA (obstructive sleep apnea)    AHI 98/hr  . Renal insufficiency   . Sleep apnea   . Stroke [facility]) 2000    Social History  Substance Use Topics  . Smoking status: Former Smoker    Types: Cigars  . Smokeless tobacco: Never Used     Comment: used to smoke cigars, 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, none since  . Alcohol use No    Family History  Problem Relation Age of Onset  . Gout Mother   . Stroke Mother   . Coronary artery disease Mother   . Hypertension Mother   . Arthritis Mother   . Coronary artery disease Father   . Arthritis Father   . Gout Father   . Stroke Father   . Hypertension Father   . Coronary artery disease Brother   . Arthritis Brother   . Gout Brother   . Stroke Brother   . Hypertension Brother   . Coronary artery disease Brother   . Arthritis Brother   . Gout Brother   . Stroke Brother   . Hypertension Brother   . Coronary artery disease Unknown        significant in multiple family members   Allergies  Allergen Reactions  . Ramipril Cough    OBJECTIVE: Blood pressure (!) 172/101, pulse 70, temperature 98.5 F (36.9 C), temperature source Oral, resp. rate (!) 25, height 6\' 2"  (1.88 m), weight 290 lb (131.5 kg), SpO2 98 %.  Physical Exam  Constitutional: He is oriented to person, place, and time and well-developed, well-nourished, and in no distress. No distress.  HENT:  Head: Normocephalic and atraumatic.  Right Ear: External ear normal.  Left Ear: External ear normal.  Nose: Nose normal.  Mouth/Throat: Oropharynx is clear and moist. No oropharyngeal exudate.    Eyes: Pupils are equal,  round, and reactive to light. Conjunctivae and EOM are normal. No scleral icterus.  Neck: Normal range of motion. Neck supple.  Cardiovascular: Normal rate, regular rhythm and normal heart sounds.  Exam reveals no gallop and no friction rub.   No murmur heard. Pulmonary/Chest: Effort normal and breath sounds normal. No respiratory distress. He has no wheezes. He has no rales.  Abdominal: Soft. Bowel sounds are normal. He exhibits no distension. There is no tenderness. There is no rebound.  Musculoskeletal: Normal range of motion. He exhibits edema. He exhibits no tenderness.       Legs: Lymphadenopathy:    He has no cervical adenopathy.  Neurological: He is alert and oriented to person, place, and time. Coordination normal.  Skin: Skin is warm and dry. No rash noted. He is not diaphoretic. No erythema. No pallor.  Psychiatric: Mood, memory, affect and judgment normal.    Lab Results Lab Results  Component Value Date   WBC 14.8 (H) 12/11/2016  HGB 10.2 (L) 12/11/2016   HCT 30.4 (L) 12/11/2016   MCV 83.5 12/11/2016   PLT 172 12/11/2016    Lab Results  Component Value Date   CREATININE 1.40 (H) 12/11/2016   BUN 16 12/11/2016   NA 140 12/11/2016   K 4.0 12/11/2016   CL 113 (H) 12/11/2016   CO2 22 12/11/2016    Lab Results  Component Value Date   ALT 14 (L) 12/11/2016   AST 22 12/11/2016   ALKPHOS 49 12/11/2016   BILITOT 0.4 12/11/2016     Microbiology: Recent Results (from the past 240 hour(s))  Culture, blood (Routine X 2) w Reflex to ID Panel     Status: None (Preliminary result)   Collection Time: 12/09/16 12:10 PM  Result Value Ref Range Status   Specimen Description BLOOD RIGHT ANTECUBITAL  Final   Special Requests   Final    BOTTLES DRAWN AEROBIC AND ANAEROBIC Blood Culture adequate volume   Culture  Setup Time   Final    GRAM POSITIVE COCCI IN CHAINS IN BOTH AEROBIC AND ANAEROBIC BOTTLES CRITICAL RESULT CALLED TO, READ BACK BY AND VERIFIED WITH: M.MACCIA,  RPHARMD AT [place] ON 12/10/16 BY C. JESSUP, MLT. Performed at [facility], [place] [address] [place], [place] [address]    Culture GRAM POSITIVE COCCI  Final   Report Status PENDING  Incomplete  Culture, blood (Routine X 2) w Reflex to ID Panel     Status: None (Preliminary result)   Collection Time: 12/09/16 12:45 PM  Result Value Ref Range Status   Specimen Description BLOOD LEFT ANTECUBITAL  Final   Special Requests   Final    BOTTLES DRAWN AEROBIC AND ANAEROBIC Blood Culture results may not be optimal due to an excessive volume of blood received in culture bottles   Culture  Setup Time   Final    GRAM POSITIVE COCCI IN CHAINS IN BOTH AEROBIC AND ANAEROBIC BOTTLES CRITICAL RESULT CALLED TO, READ BACK BY AND VERIFIED WITH: Hughie Closs, [place] AT [phone] ON 12/10/16 BY C. JESSUP, MLT. Performed at [facility], [place] [address] [place], [place] [id number]    Culture GRAM POSITIVE COCCI  Final   Report Status PENDING  Incomplete  Blood Culture ID Panel (Reflexed)     Status: Abnormal   Collection Time: 12/09/16 12:45 PM  Result Value Ref Range Status   Enterococcus species NOT DETECTED NOT DETECTED Final   Listeria monocytogenes NOT DETECTED NOT DETECTED Final   Staphylococcus species NOT DETECTED NOT DETECTED Final   Staphylococcus aureus NOT DETECTED NOT DETECTED Final   Streptococcus species DETECTED (A) NOT DETECTED Final    Comment: Not Enterococcus species, Streptococcus agalactiae, Streptococcus pyogenes, or Streptococcus pneumoniae. CRITICAL RESULT CALLED TO, READ BACK BY AND VERIFIED WITH: Hughie Closs, RPHARMD AT [phone] ON 12/10/16 BY C. JESSUP, MLT.    Streptococcus agalactiae NOT DETECTED NOT DETECTED Final   Streptococcus pneumoniae NOT DETECTED NOT DETECTED Final   Streptococcus pyogenes NOT DETECTED NOT DETECTED Final   Acinetobacter baumannii NOT DETECTED NOT DETECTED Final   Enterobacteriaceae species NOT DETECTED NOT DETECTED Final   Enterobacter cloacae complex  NOT DETECTED NOT DETECTED Final   Escherichia coli NOT DETECTED NOT DETECTED Final   Klebsiella oxytoca NOT DETECTED NOT DETECTED Final   Klebsiella pneumoniae NOT DETECTED NOT DETECTED Final   Proteus species NOT DETECTED NOT DETECTED Final   Serratia marcescens NOT DETECTED NOT DETECTED Final   Haemophilus influenzae NOT DETECTED NOT DETECTED Final  Neisseria meningitidis NOT DETECTED NOT DETECTED Final   Pseudomonas aeruginosa NOT DETECTED NOT DETECTED Final   Candida albicans NOT DETECTED NOT DETECTED Final   Candida glabrata NOT DETECTED NOT DETECTED Final   Candida krusei NOT DETECTED NOT DETECTED Final   Candida parapsilosis NOT DETECTED NOT DETECTED Final   Candida tropicalis NOT DETECTED NOT DETECTED Final    Comment: Performed at [facility], [place] [address], [place], [place] [address]  Culture, blood (Routine X 2) w Reflex to ID Panel     Status: None (Preliminary result)   Collection Time: 12/10/16  3:58 PM  Result Value Ref Range Status   Specimen Description BLOOD LEFT HAND  Final   Special Requests   Final    BOTTLES DRAWN AEROBIC AND ANAEROBIC Blood Culture adequate volume   Culture NO GROWTH < 24 HOURS  Final   Report Status PENDING  Incomplete  Culture, blood (Routine X 2) w Reflex to ID Panel     Status: None (Preliminary result)   Collection Time: 12/10/16  3:58 PM  Result Value Ref Range Status   Specimen Description BLOOD LEFT ANTECUBITAL  Final   Special Requests   Final    BOTTLES DRAWN AEROBIC AND ANAEROBIC Blood Culture adequate volume   Culture NO GROWTH < 24 HOURS  Final   Report Status PENDING  Incomplete    Alcide Evener, [place] [facility] [phone] pager   [phone] cell 12/11/2016, 3:24 PM

## 2016-12-11 NOTE — Progress Notes (Signed)
  Echocardiogram Echocardiogram Transesophageal has been performed.  [name] M 12/11/2016, 2:37 PM

## 2016-12-11 NOTE — Progress Notes (Signed)
BP 172/101, MD made aware

## 2016-12-11 NOTE — CV Procedure (Signed)
TRANSESOPHAGEAL ECHOCARDIOGRAM (TEE) NOTE  INDICATIONS: infective endocarditis  PROCEDURE:   Informed consent was obtained prior to the procedure. The risks, benefits and alternatives for the procedure were discussed and the patient comprehended these risks.  Risks include, but are not limited to, cough, sore throat, vomiting, nausea, somnolence, esophageal and stomach trauma or perforation, bleeding, low blood pressure, aspiration, pneumonia, infection, trauma to the teeth and death.    After a procedural time-out, the patient was given 3 mg versed and 50 mcg fentanyl for moderate sedation.  The patient's heart rate, blood pressure, and oxygen saturation are monitored continuously during the procedure.The oropharynx was anesthetized 10 cc of topical 1% viscous lidocaine and 2 cetacaine sprays.  The transesophageal probe was inserted in the esophagus and stomach without difficulty and multiple views were obtained.  The patient was kept under observation until the patient left the procedure room.  The period of conscious sedation is 21 minutes, of which I was present face-to-face 100% of this time. The patient left the procedure room in stable condition.   Agitated microbubble saline contrast was not administered.  COMPLICATIONS:    There were no immediate complications.  Findings:  1. LEFT VENTRICLE: The left ventricular wall thickness is mildly increased.  The left ventricular cavity is normal in size. Wall motion is normal.  LVEF is 55-60%.  2. RIGHT VENTRICLE:  The right ventricle is normal in structure and function without any thrombus or masses.    3. LEFT ATRIUM:  The left atrium is mildly dilated in size without any thrombus or masses.  There is not spontaneous echo contrast ("smoke") in the left atrium consistent with a low flow state.  4. LEFT ATRIAL APPENDAGE:  The left atrial appendage is free of any thrombus or masses. The appendage has single lobes. Pulse doppler indicates  high flow in the appendage.  5. ATRIAL SEPTUM:  The atrial septum is not aneurysmal. There is no evidence for interatrial shunting by color doppler and saline microbubble.  6. RIGHT ATRIUM:  The right atrium is normal in size and function There is a large filamentous structure consistent with Chiari network.  7. MITRAL VALVE:  The mitral valve is normal in structure and function with trivail regurgitation.  There were no vegetations or stenosis.  8. AORTIC VALVE:  The aortic valve is trileaflet, normal in structure and function with no regurgitation.  There were no vegetations or stenosis  9. TRICUSPID VALVE:  The tricuspid valve is normal in structure and function with trivial regurgitation.  There were no vegetations or stenosis  10.  PULMONIC VALVE:  The pulmonic valve is normal in structure and function with no regurgitation.  There were no vegetations or stenosis.   11. AORTIC ARCH, ASCENDING AND DESCENDING AORTA:  There was no Ron Parker et. Al, 1992) atherosclerosis of the ascending aorta, aortic arch, or proximal descending aorta.  12. PULMONARY VEINS: Anomalous pulmonary venous return was not noted.  13. PERICARDIUM: The pericardium appeared normal and non-thickened.  There is no pericardial effusion.  IMPRESSION:   1. No evidence for endocarditis 2. No LAA thrombus 3. Negative for PFO 4. Large chiari network (congenital remnant) 5. LVEF 55-60%  RECOMMENDATIONS:    1.  No evidence for endocarditis.  Time Spent Directly with the Patient:  45 minutes   [name], MD, [place]  Attending Cardiologist  Direct Dial: [phone]  Fax: [phone]  Website:  [email or web address] 12/11/2016, 1:42 PM

## 2016-12-11 NOTE — Progress Notes (Signed)
PROGRESS NOTE    [name]  HYQ:[id number] DOB: 1940-04-24 DOA: 12/09/2016 PCP: Rogers Blocker, MD   Brief Narrative:  [name] is a 76 y.o. male with medical history significant of CAD, HTN, OSA, HLD, Hx of Streptococcus G Bacteremia who presented with over a day of chills and subjective fever.  No rash.  No headache.  No n/v/d. No urinary symptoms such as dysuria or hematuria.  No cough.  No sob.  No pain in chest or abdomen.  No nasal congestion or any uri symptoms.  Went to urgent care found to have high temp over 103 and for some reason a troponin was checked which was 0.03.  Therefore pt was referred for admission for possible ACS. Patient found to be septic with a Streptococcus Bacteremia with no evident source. Discussed Case with ID and will need a TEE. Of note Patient had a Hx of Streptococcus Bacteremia in 2010 with a Abscess in Thigh. Patient is improving however obtained formal ID Consult and are to see the patient as no source of Bacteremia was identified as patient has no skin tears and no changes in dentition. Patient to undergo TEE this Afternoon.  Assessment & Plan:   Principal Problem:   Bacteremia due to Streptococcus Active Problems:   Hyperlipidemia   Obstructive sleep apnea   Essential hypertension   CAD (coronary artery disease)   Febrile illness   Febrile illness, acute   Sepsis (HCC)   Lactic acidosis   Elevated troponin   Renal insufficiency   Hypophosphatemia   Left leg swelling  Sepsis 2/2 to Streptococcus Bacteremia -Patient was Febrile at (103.1), Tachycardic, Tachypenic, and had a Leukocytosis and now has a Gram Positive Cocci Bacteremia -Blood Cx x2 Positive -Changed IV Vanc and Zosyn to IV Ceftriaxone 2 grams q24h -Discussed case with Dr. Tommy Medal of Infectious Diseases and recommends obtaining a TEE; Will consult Cardiology for TEE and will be done later this Afternoon -Formally Consulted Infectious Diseases Dr. Tommy Medal -Check ECHOCARDIOGRAM;  Obtain TEE (to be done later this afternoon) -WBC went from 16.1 -> 19.2 -> 14.8 -Repeat Blood Cx pending   -Admission CXR showed Stable sequelae of CABG and mediastinal contours. Tortuous thoracic aorta. Stable lung volumes. No pneumothorax, pulmonary edema, pleural effusion or confluent pulmonary opacity. Mild eventration of the diaphragm. No acute osseous abnormality identified. Negative visible bowel gas pattern -Urinalysis was Clear   -Appreciate ID Recc's -Follow TTE and TEE Results  Lactic Acidosis -Lactic Acid Level went from 2.40 -> 2.20 -> 1.0 -> 2.0 -C/w IVF NS at 100 mL/hr; Gave a Bolus of NS at 500 mL on 12/10/16  Elevated Troponin -Elevated from 0.03 -> 0.24 -> 0.31 -> 0.29 -> 0.21 -> 0.25; Flat and not indicative of ACS -Denies Chest Pain -Likely in the Setting of Sepsis   Obstructive Sleep Apnea -C/w Home CPAP  Renal Insufficiency, improving -Unknown Baseline -BUN/Cr went from 30/1.49 -> 25/1.61 -> 20/1.55 -> 16/1.40 -C/w IVF with NS at 100 mL/hr -Avoid Nephrotoxics if possible and Hold Lasix, Olmesartan, and Allopurinol Currently -Repeat CMP in AM   Hypophosphatemia -Patient's Phos Level was 1.7 -Replete with IV KPhos 30 mmol -Continue to Monitor and Replete as Necessary -Replete Phos Level in AM   Hx of CAD s/p CABGx4 -C/w ASA 81 mg po Daily, Metoprolol 25 mg po BID, and Pravastatin 20 mg po Daily -Troponin Trending down and flat and likely 2/2 to Sepsis -Obtaining TTE (done and pending read) and TEE  Hypertension -BP Currently 153/96 -Held Amlodipine 2.5 mg po Daily, Olmesartam 40 mg po Daily, Lasix 20 mg po BID -If Necessary Add IV Hydralazine   HLD -C/w Lovastatin 20 mg po Daily with Pharmacy Subsitution with Pravastatin 20 mg po qHS  Gout -Hold Allopurinol currently   Left Leg Swelling -U/S done and showed no evidence of DVT within the LLE -No Erythema on Leg   DVT prophylaxis: SCDs; C/w Heparin 5,000 sq q8h  Code Status: FULL CODE Family  Communication: Discussed with Wife at Bedside Disposition Plan: Remain Inpatient for Treatment of Bacteremia  Consultants:   Infectious Diseases Dr. Tommy Medal  Will get Cardiology to evaluate for TEE   Procedures: ECHOCARDIOGRAM and pending   TEE to be done by Cardiology later this Afternoon   Antimicrobials:  Anti-infectives    Start     Dose/Rate Route Frequency Ordered Stop   12/10/16 2100  vancomycin (VANCOCIN) 1,250 mg in sodium chloride 0.9 % 250 mL IVPB  Status:  Discontinued     1,250 mg 166.7 mL/hr over 90 Minutes Intravenous Every 24 hours 12/09/16 2134 12/10/16 0908   12/10/16 1200  cefTRIAXone (ROCEPHIN) 2 g in dextrose 5 % 50 mL IVPB     2 g 100 mL/hr over 30 Minutes Intravenous Every 24 hours 12/10/16 0908     12/09/16 2200  piperacillin-tazobactam (ZOSYN) IVPB 3.375 g  Status:  Discontinued     3.375 g 12.5 mL/hr over 240 Minutes Intravenous Every 8 hours 12/09/16 2133 12/10/16 0908   12/09/16 2145  vancomycin (VANCOCIN) 1,500 mg in sodium chloride 0.9 % 500 mL IVPB     1,500 mg 250 mL/hr over 120 Minutes Intravenous  Once 12/09/16 2134 12/09/16 2354   12/09/16 1430  oseltamivir (TAMIFLU) capsule 75 mg     75 mg Oral  Once 12/09/16 1416 12/09/16 1422   12/09/16 1245  piperacillin-tazobactam (ZOSYN) IVPB 3.375 g     3.375 g 100 mL/hr over 30 Minutes Intravenous  Once 12/09/16 1235 12/09/16 1423   12/09/16 1245  vancomycin (VANCOCIN) IVPB 1000 mg/200 mL premix     1,000 mg 200 mL/hr over 60 Minutes Intravenous  Once 12/09/16 1235 12/09/16 1554     Subjective: Seen and examined at bedside and was feeling better. No CP or SOB. No Dysuria or urinary discomfort. Denied any skin rashes or pain. No lightheadedness or dizziness.   Objective: Vitals:   12/10/16 1631 12/10/16 2000 12/11/16 0612 12/11/16 0859  BP: (!) 163/78 (!) 160/95 132/74 (!) 153/96  Pulse: 86 98 82 85  Resp: 20 19 16 18   Temp: (!) 100.8 F (38.2 C) 99.9 F (37.7 C) 98.9 F (37.2 C) 98.5 F  (36.9 C)  TempSrc: Oral  Oral Oral  SpO2: 97% 100% 98% 98%  Weight:  132.9 kg (292 lb 15.9 oz)    Height:        Intake/Output Summary (Last 24 hours) at 12/11/16 1124 Last data filed at 12/11/16 0900  Gross per 24 hour  Intake          2113.33 ml  Output             2000 ml  Net           113.33 ml   Filed Weights   12/09/16 1201 12/09/16 2004 12/10/16 2000  Weight: 129.7 kg (286 lb) 129.5 kg (285 lb 7.9 oz) 132.9 kg (292 lb 15.9 oz)   Examination: Physical Exam:  Constitutional: WN/WD obese AAM in NAD Eyes:  Sclerae anicteric; Conjunctivae non-injected.  ENMT: External Ears and Nose appear normal. MMM. Has No teeth Neck: Supple with no JVD Respiratory: CTAB; No wheezing/rales/rhonchi. Patient was not tachypenic or using any accessory muscles to breathe Cardiovascular: RRR; S1 S2; Mild LE Edema Abdomen: Soft, NT, ND. Bowel sounds present GU: Deferred Musculoskeletal: No contractures; No cyanosis Skin: Warm and dry. No rashes or lesions appreciated. Patient has dry skin and some swelling in LE Neurologic: CN 2-12 grossly intact. No appreciable focal deficits Psychiatric: Pleasant mood and affect. Intact judgement and insight  Data Reviewed: I have personally reviewed following labs and imaging studies  CBC:  Recent Labs Lab 12/09/16 1209 12/10/16 0757 12/11/16 0125  WBC 16.1* 19.2* 14.8*  NEUTROABS 14.0* 16.7* 11.3*  HGB 12.3* 11.4* 10.2*  HCT 37.4* 35.5* 30.4*  MCV 84.0 84.3 83.5  PLT 197 173 654   Basic Metabolic Panel:  Recent Labs Lab 12/09/16 1209 12/10/16 0757 12/11/16 0125 12/11/16 0705  NA 138 141 139 140  K 4.5 4.1 3.8 4.0  CL 108 113* 110 113*  CO2 22 21* 21* 22  GLUCOSE 114* 90 136* 95  BUN 30* 25* 20 16  CREATININE 1.49* 1.61* 1.55* 1.40*  CALCIUM 9.5 9.1 9.0 9.1  MG  --  1.8 1.8  --   PHOS  --  2.1* 1.7*  --    GFR: Estimated Creatinine Clearance: 67.1 mL/min (A) (by C-G formula based on SCr of 1.4 mg/dL (H)). Liver Function  Tests:  Recent Labs Lab 12/09/16 1209 12/10/16 0757 12/11/16 0125  AST 29 24 22   ALT 16* 17 14*  ALKPHOS 54 45 49  BILITOT 0.5 1.1 0.4  PROT 8.0 6.6 6.2*  ALBUMIN 3.8 3.0* 2.7*   No results for input(s): LIPASE, AMYLASE in the last 168 hours. No results for input(s): AMMONIA in the last 168 hours. Coagulation Profile:  Recent Labs Lab 12/11/16 0705  INR 1.30   Cardiac Enzymes:  Recent Labs Lab 12/09/16 2316 12/10/16 0347 12/10/16 0757 12/10/16 1407 12/10/16 1951  TROPONINI 0.24* 0.31* 0.29* 0.21* 0.25*   BNP (last 3 results) No results for input(s): PROBNP in the last 8760 hours. HbA1C: No results for input(s): HGBA1C in the last 72 hours. CBG: No results for input(s): GLUCAP in the last 168 hours. Lipid Profile: No results for input(s): CHOL, HDL, LDLCALC, TRIG, CHOLHDL, LDLDIRECT in the last 72 hours. Thyroid Function Tests: No results for input(s): TSH, T4TOTAL, FREET4, T3FREE, THYROIDAB in the last 72 hours. Anemia Panel: No results for input(s): VITAMINB12, FOLATE, FERRITIN, TIBC, IRON, RETICCTPCT in the last 72 hours. Sepsis Labs:  Recent Labs Lab 12/09/16 1222 12/09/16 1506 12/10/16 0757 12/10/16 1037  LATICACIDVEN 2.40* 2.20* 1.0 2.0*    Recent Results (from the past 240 hour(s))  Culture, blood (Routine X 2) w Reflex to ID Panel     Status: None (Preliminary result)   Collection Time: 12/09/16 12:10 PM  Result Value Ref Range Status   Specimen Description BLOOD RIGHT ANTECUBITAL  Final   Special Requests   Final    BOTTLES DRAWN AEROBIC AND ANAEROBIC Blood Culture adequate volume   Culture  Setup Time   Final    GRAM POSITIVE COCCI IN CHAINS IN BOTH AEROBIC AND ANAEROBIC BOTTLES CRITICAL RESULT CALLED TO, READ BACK BY AND VERIFIED WITH: [place], [place] AT [place] ON 12/10/16 BY C. JESSUP, MLT. Performed at [facility], [place] [address], [place], [place] [address]    Culture [facility] POSITIVE COCCI  Final  Report Status PENDING   Incomplete  Culture, blood (Routine X 2) w Reflex to ID Panel     Status: None (Preliminary result)   Collection Time: 12/09/16 12:45 PM  Result Value Ref Range Status   Specimen Description BLOOD LEFT ANTECUBITAL  Final   Special Requests   Final    BOTTLES DRAWN AEROBIC AND ANAEROBIC Blood Culture results may not be optimal due to an excessive volume of blood received in culture bottles   Culture  Setup Time   Final    GRAM POSITIVE COCCI IN CHAINS IN BOTH AEROBIC AND ANAEROBIC BOTTLES CRITICAL RESULT CALLED TO, READ BACK BY AND VERIFIED WITH: Hughie Closs, [place] AT [phone] ON 12/10/16 BY C. JESSUP, MLT. Performed at [facility], [place] [address] [place], [place] [address]    Culture GRAM POSITIVE COCCI  Final   Report Status PENDING  Incomplete  Blood Culture ID Panel (Reflexed)     Status: Abnormal   Collection Time: 12/09/16 12:45 PM  Result Value Ref Range Status   Enterococcus species NOT DETECTED NOT DETECTED Final   Listeria monocytogenes NOT DETECTED NOT DETECTED Final   Staphylococcus species NOT DETECTED NOT DETECTED Final   Staphylococcus aureus NOT DETECTED NOT DETECTED Final   Streptococcus species DETECTED (A) NOT DETECTED Final    Comment: Not Enterococcus species, Streptococcus agalactiae, Streptococcus pyogenes, or Streptococcus pneumoniae. CRITICAL RESULT CALLED TO, READ BACK BY AND VERIFIED WITH: Hughie Closs, RPHARMD AT [phone] ON 12/10/16 BY C. JESSUP, MLT.    Streptococcus agalactiae NOT DETECTED NOT DETECTED Final   Streptococcus pneumoniae NOT DETECTED NOT DETECTED Final   Streptococcus pyogenes NOT DETECTED NOT DETECTED Final   Acinetobacter baumannii NOT DETECTED NOT DETECTED Final   Enterobacteriaceae species NOT DETECTED NOT DETECTED Final   Enterobacter cloacae complex NOT DETECTED NOT DETECTED Final   Escherichia coli NOT DETECTED NOT DETECTED Final   Klebsiella oxytoca NOT DETECTED NOT DETECTED Final   Klebsiella pneumoniae NOT DETECTED NOT DETECTED  Final   Proteus species NOT DETECTED NOT DETECTED Final   Serratia marcescens NOT DETECTED NOT DETECTED Final   Haemophilus influenzae NOT DETECTED NOT DETECTED Final   Neisseria meningitidis NOT DETECTED NOT DETECTED Final   Pseudomonas aeruginosa NOT DETECTED NOT DETECTED Final   Candida albicans NOT DETECTED NOT DETECTED Final   Candida glabrata NOT DETECTED NOT DETECTED Final   Candida krusei NOT DETECTED NOT DETECTED Final   Candida parapsilosis NOT DETECTED NOT DETECTED Final   Candida tropicalis NOT DETECTED NOT DETECTED Final    Comment: Performed at [facility], [place] [address] [place], [place] [address]  Culture, blood (Routine X 2) w Reflex to ID Panel     Status: None (Preliminary result)   Collection Time: 12/10/16  3:58 PM  Result Value Ref Range Status   Specimen Description BLOOD LEFT ANTECUBITAL  Final   Special Requests   Final    BOTTLES DRAWN AEROBIC AND ANAEROBIC Blood Culture adequate volume   Culture PENDING  Incomplete   Report Status PENDING  Incomplete    Radiology Studies: Dg Chest 2 View  Result Date: 12/09/2016 CLINICAL DATA:  76 year old male with weakness fever chills nausea and vomiting since last night. EXAM: CHEST  2 VIEW COMPARISON:  10/08/2008 and earlier. FINDINGS: Stable sequelae of CABG and mediastinal contours. Tortuous thoracic aorta. Stable lung volumes. No pneumothorax, pulmonary edema, pleural effusion or confluent pulmonary opacity. Mild eventration of the diaphragm. No acute osseous abnormality identified. Negative visible bowel gas pattern.  IMPRESSION: No acute cardiopulmonary abnormality. Electronically Signed   By: Genevie Ann M.D.   On: 12/09/2016 12:43   US Venous Img Lower Unilateral Left  Result Date: 12/09/2016 CLINICAL DATA:  Bilateral lower extremity swelling greater on the left than on the right. No pain. Patient also reports weakness and chills and nausea vomiting since 2 a.m. today. EXAM: Left LOWER EXTREMITY VENOUS DOPPLER  ULTRASOUND TECHNIQUE: Gray-scale sonography with graded compression, as well as color Doppler and duplex ultrasound were performed to evaluate the lower extremity deep venous systems from the level of the common femoral vein and including the common femoral, femoral, profunda femoral, popliteal and calf veins including the posterior tibial, peroneal and gastrocnemius veins when visible. The superficial great saphenous vein was also interrogated. Spectral Doppler was utilized to evaluate flow at rest and with distal augmentation maneuvers in the common femoral, femoral and popliteal veins. COMPARISON:  None. FINDINGS: Contralateral Common Femoral Vein: Respiratory phasicity is normal and symmetric with the symptomatic side. No evidence of thrombus. Normal compressibility. Common Femoral Vein: No evidence of thrombus. Normal compressibility, respiratory phasicity and response to augmentation. Saphenofemoral Junction: No evidence of thrombus. Normal compressibility and flow on color Doppler imaging. Profunda Femoral Vein: No evidence of thrombus. Normal compressibility and flow on color Doppler imaging. Femoral Vein: No evidence of thrombus. Normal compressibility, respiratory phasicity and response to augmentation. Popliteal Vein: No evidence of thrombus. Normal compressibility, respiratory phasicity and response to augmentation. Calf Veins: Normal appearance of the posterior tibial vein. The peroneal vein could not be visualized. Superficial Great Saphenous Vein: No evidence of thrombus. Normal compressibility and flow on color Doppler imaging. Venous Reflux:  None. 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 Enlarged left inguinal lymph node measuring 4.4 x 1.2 cm. IMPRESSION: No evidence of DVT within the left lower extremity. Electronically Signed   By: David  Martinique M.D.   On: 12/09/2016 13:43   Scheduled Meds: . aspirin EC  81 mg Oral Daily  . heparin subcutaneous  5,000 Units Subcutaneous Q8H   . metoprolol tartrate  25 mg Oral BID  . pravastatin  20 mg Oral q1800  . sodium chloride flush  3 mL Intravenous Q12H   Continuous Infusions: . sodium chloride    . sodium chloride 100 mL/hr at 12/10/16 2014  . cefTRIAXone (ROCEPHIN)  IV 2 g (12/11/16 1057)  . sodium chloride      LOS: 1 day   Kerney Elbe, DO [facility] Pager [phone]  If 7PM-7AM, please contact night-coverage [email or web address] Password [email or web address] 12/11/2016, 11:24 AM

## 2016-12-11 NOTE — H&P (Signed)
   INTERVAL PROCEDURE H&P  History and Physical Interval Note:  12/11/2016 1:08 PM  [name] has presented today for their planned procedure. The various methods of treatment have been discussed with the patient and family. After consideration of risks, benefits and other options for treatment, the patient has consented to the procedure.  The patients' outpatient history has been reviewed, patient examined, and no change in status from most recent office note within the past 30 days. I have reviewed the patients' chart and labs and will proceed as planned. Questions were answered to the patient's satisfaction.   Shane Casino, MD, [place]  Attending Cardiologist  Direct Dial: [phone]  Fax: [phone]  Website:  [name] [name] 12/11/2016, 1:08 PM

## 2016-12-11 NOTE — Progress Notes (Signed)
  Echocardiogram 2D Echocardiogram has been performed.  Merrie Roof F 12/11/2016, 9:59 AM

## 2016-12-12 LAB — COMPREHENSIVE METABOLIC PANEL
ALK PHOS: 54 U/L (ref 38–126)
ALT: 17 U/L (ref 17–63)
AST: 26 U/L (ref 15–41)
Albumin: 2.8 g/dL — ABNORMAL LOW (ref 3.5–5.0)
Anion gap: 8 (ref 5–15)
BILIRUBIN TOTAL: 0.6 mg/dL (ref 0.3–1.2)
BUN: 16 mg/dL (ref 6–20)
CALCIUM: 8.7 mg/dL — AB (ref 8.9–10.3)
CHLORIDE: 106 mmol/L (ref 101–111)
CO2: 19 mmol/L — ABNORMAL LOW (ref 22–32)
CREATININE: 1.37 mg/dL — AB (ref 0.61–1.24)
GFR, EST AFRICAN AMERICAN: 57 mL/min — AB (ref >60)
GFR, EST NON AFRICAN AMERICAN: 49 mL/min — AB (ref >60)
Glucose, Bld: 162 mg/dL — ABNORMAL HIGH (ref 65–99)
Potassium: 3.7 mmol/L (ref 3.5–5.1)
Sodium: 133 mmol/L — ABNORMAL LOW (ref 135–145)
Total Protein: 7 g/dL (ref 6.5–8.1)

## 2016-12-12 LAB — CULTURE, BLOOD (ROUTINE X 2): Special Requests: ADEQUATE

## 2016-12-12 LAB — CBC WITH DIFFERENTIAL/PLATELET
Basophils Absolute: 0 10*3/uL (ref 0.0–0.1)
Basophils Relative: 0 %
EOS PCT: 3 %
Eosinophils Absolute: 0.3 10*3/uL (ref 0.0–0.7)
HCT: 32.7 % — ABNORMAL LOW (ref 39.0–52.0)
Hemoglobin: 10.5 g/dL — ABNORMAL LOW (ref 13.0–17.0)
LYMPHS ABS: 1.6 10*3/uL (ref 0.7–4.0)
Lymphocytes Relative: 16 %
MCH: 26.9 pg (ref 26.0–34.0)
MCHC: 32.1 g/dL (ref 30.0–36.0)
MCV: 83.6 fL (ref 78.0–100.0)
MONO ABS: 0.7 10*3/uL (ref 0.1–1.0)
Monocytes Relative: 7 %
Neutro Abs: 7.2 10*3/uL (ref 1.7–7.7)
Neutrophils Relative %: 74 %
Platelets: 191 10*3/uL (ref 150–400)
RBC: 3.91 MIL/uL — AB (ref 4.22–5.81)
RDW: 15.5 % (ref 11.5–15.5)
WBC: 9.8 10*3/uL (ref 4.0–10.5)

## 2016-12-12 LAB — MAGNESIUM: MAGNESIUM: 1.8 mg/dL (ref 1.7–2.4)

## 2016-12-12 LAB — PHOSPHORUS: Phosphorus: 2.3 mg/dL — ABNORMAL LOW (ref 2.5–4.6)

## 2016-12-12 MED ORDER — BLISTEX MEDICATED EX OINT
TOPICAL_OINTMENT | CUTANEOUS | Status: DC | PRN
  Filled 2016-12-12: qty 6.3

## 2016-12-12 MED ORDER — FUROSEMIDE 20 MG PO TABS
20.0000 mg | ORAL_TABLET | Freq: Once | ORAL | Status: AC
  Administered 2016-12-12: 20 mg via ORAL
  Filled 2016-12-12: qty 1

## 2016-12-12 MED ORDER — K PHOS MONO-SOD PHOS DI & MONO 155-852-130 MG PO TABS
500.0000 mg | ORAL_TABLET | Freq: Three times a day (TID) | ORAL | Status: DC
  Filled 2016-12-12: qty 2

## 2016-12-12 MED ORDER — CEFTRIAXONE IV (FOR PTA / DISCHARGE USE ONLY): 2.0000 g | INTRAVENOUS | 0 refills | Status: AC

## 2016-12-12 MED ORDER — K PHOS MONO-SOD PHOS DI & MONO 155-852-130 MG PO TABS: 500.0000 mg | ORAL_TABLET | Freq: Three times a day (TID) | ORAL | 0 refills | Status: DC

## 2016-12-12 MED ORDER — SODIUM CHLORIDE 0.9% FLUSH: 10.0000 mL | INTRAVENOUS | Status: DC | PRN

## 2016-12-12 MED ORDER — SODIUM CHLORIDE 0.9% FLUSH: 10.0000 mL | Freq: Two times a day (BID) | INTRAVENOUS | Status: DC

## 2016-12-12 NOTE — Progress Notes (Signed)
PHARMACY CONSULT NOTE FOR:  OUTPATIENT  PARENTERAL ANTIBIOTIC THERAPY (OPAT)  Indication: Group G Strep Bacteremia Regimen: Rocephin 2g IV every 24 hours End date: 12/23/16  IV antibiotic discharge orders are pended. To discharging provider:  please sign these orders via discharge navigator,  Select New Orders & click on the button choice - Manage This Unsigned Work.   Thank you for allowing pharmacy to be a part of this patient's care.  Lawson Radar 12/12/2016, 10:42 AM

## 2016-12-12 NOTE — Progress Notes (Signed)
Subjective: No new complaints   Antibiotics:  Anti-infectives    Start     Dose/Rate Route Frequency Ordered Stop   12/10/16 2100  vancomycin (VANCOCIN) 1,250 mg in sodium chloride 0.9 % 250 mL IVPB  Status:  Discontinued     1,250 mg 166.7 mL/hr over 90 Minutes Intravenous Every 24 hours 12/09/16 2134 12/10/16 0908   12/10/16 1200  cefTRIAXone (ROCEPHIN) 2 g in dextrose 5 % 50 mL IVPB     2 g 100 mL/hr over 30 Minutes Intravenous Every 24 hours 12/10/16 0908     12/09/16 2200  piperacillin-tazobactam (ZOSYN) IVPB 3.375 g  Status:  Discontinued     3.375 g 12.5 mL/hr over 240 Minutes Intravenous Every 8 hours 12/09/16 2133 12/10/16 0908   12/09/16 2145  vancomycin (VANCOCIN) 1,500 mg in sodium chloride 0.9 % 500 mL IVPB     1,500 mg 250 mL/hr over 120 Minutes Intravenous  Once 12/09/16 2134 12/09/16 2354   12/09/16 1430  oseltamivir (TAMIFLU) capsule 75 mg     75 mg Oral  Once 12/09/16 1416 12/09/16 1422   12/09/16 1245  piperacillin-tazobactam (ZOSYN) IVPB 3.375 g     3.375 g 100 mL/hr over 30 Minutes Intravenous  Once 12/09/16 1235 12/09/16 1423   12/09/16 1245  vancomycin (VANCOCIN) IVPB 1000 mg/200 mL premix     1,000 mg 200 mL/hr over 60 Minutes Intravenous  Once 12/09/16 1235 12/09/16 1554      Medications: Scheduled Meds: . aspirin EC  81 mg Oral Daily  . heparin subcutaneous  5,000 Units Subcutaneous Q8H  . metoprolol tartrate  25 mg Oral BID  . pravastatin  20 mg Oral q1800  . sodium chloride flush  3 mL Intravenous Q12H   Continuous Infusions: . sodium chloride    . sodium chloride 100 mL/hr at 12/12/16 0547  . cefTRIAXone (ROCEPHIN)  IV Stopped (12/11/16 1127)  . sodium chloride     PRN Meds:.sodium chloride, acetaminophen **OR** acetaminophen, ondansetron **OR** ondansetron (ZOFRAN) IV, sodium chloride flush    Objective: Weight change: -2 lb 15.9 oz (-1.357 kg)  Intake/Output Summary (Last 24 hours) at 12/12/16 1019 Last data filed at  12/12/16 0600  Gross per 24 hour  Intake             2350 ml  Output             1995 ml  Net              355 ml   Blood pressure (!) 149/77, pulse 92, temperature 98.5 F (36.9 C), temperature source Oral, resp. rate (!) 22, height 6\' 2"  (1.88 m), weight 292 lb 1.6 oz (132.5 kg), SpO2 97 %. Temp:  [98.4 F (36.9 C)-100.4 F (38 C)] 98.5 F (36.9 C) (10/13 0900) Pulse Rate:  [56-92] 92 (10/13 0900) Resp:  [17-35] 22 (10/13 0520) BP: (149-227)/(77-137) 149/77 (10/13 0900) SpO2:  [96 %-100 %] 97 % (10/13 0900) Weight:  [290 lb (131.5 kg)-292 lb 1.6 oz (132.5 kg)] 292 lb 1.6 oz (132.5 kg) (10/12 2343)  Physical Exam: General: Alert and awake, oriented x3, not in any acute distress. HEENT: anicteric sclera,  EOMI CVS regular rate, normal r,  no murmur rubs or gallops Chest: clear to auscultation bilaterally, no wheezing, rales or rhonchi Abdomen: soft nontender, nondistended, normal bowel sounds, Extremities: no  clubbing or edema noted bilaterally Skin: bilateral edema Neuro: nonfocal  CBC:  CBC Latest Ref Rng & Units  12/11/2016 12/10/2016 12/09/2016  WBC 4.0 - 10.5 K/uL 14.8(H) 19.2(H) 16.1(H)  Hemoglobin 13.0 - 17.0 g/dL 10.2(L) 11.4(L) 12.3(L)  Hematocrit 39.0 - 52.0 % 30.4(L) 35.5(L) 37.4(L)  Platelets 150 - 400 K/uL 172 173 197      BMET  Recent Labs  12/11/16 0125 12/11/16 0705  NA 139 140  K 3.8 4.0  CL 110 113*  CO2 21* 22  GLUCOSE 136* 95  BUN 20 16  CREATININE 1.55* 1.40*  CALCIUM 9.0 9.1     Liver Panel   Recent Labs  12/10/16 0757 12/11/16 0125  PROT 6.6 6.2*  ALBUMIN 3.0* 2.7*  AST 24 22  ALT 17 14*  ALKPHOS 45 49  BILITOT 1.1 0.4       Sedimentation Rate No results for input(s): ESRSEDRATE in the last 72 hours. C-Reactive Protein No results for input(s): CRP in the last 72 hours.  Micro Results: Recent Results (from the past 720 hour(s))  Culture, blood (Routine X 2) w Reflex to ID Panel     Status: Abnormal   Collection  Time: 12/09/16 12:10 PM  Result Value Ref Range Status   Specimen Description BLOOD RIGHT ANTECUBITAL  Final   Special Requests   Final    BOTTLES DRAWN AEROBIC AND ANAEROBIC Blood Culture adequate volume   Culture  Setup Time   Final    GRAM POSITIVE COCCI IN CHAINS IN BOTH AEROBIC AND ANAEROBIC BOTTLES CRITICAL RESULT CALLED TO, READ BACK BY AND VERIFIED WITH: [place], [place] AT [place] ON 12/10/16 BY C. JESSUP, MLT.    Culture (A)  Final    STREPTOCOCCUS GROUP G SUSCEPTIBILITIES PERFORMED ON PREVIOUS CULTURE WITHIN THE LAST 5 DAYS. Performed at [facility], [place] [address] [place], [place] [address]    Report Status 12/12/2016 FINAL  Final  Culture, blood (Routine X 2) w Reflex to ID Panel     Status: Abnormal   Collection Time: 12/09/16 12:45 PM  Result Value Ref Range Status   Specimen Description BLOOD LEFT ANTECUBITAL  Final   Special Requests   Final    BOTTLES DRAWN AEROBIC AND ANAEROBIC Blood Culture results may not be optimal due to an excessive volume of blood received in culture bottles   Culture  Setup Time   Final    GRAM POSITIVE COCCI IN CHAINS IN BOTH AEROBIC AND ANAEROBIC BOTTLES CRITICAL RESULT CALLED TO, READ BACK BY AND VERIFIED WITH: Hughie Closs, [place] AT 0093 ON 12/10/16 BY C. JESSUP, MLT. Performed at [facility], [place] [address] [place], [place] [address]    Culture STREPTOCOCCUS GROUP G (A)  Final   Report Status 12/12/2016 FINAL  Final   Organism ID, Bacteria STREPTOCOCCUS GROUP G  Final      Susceptibility   Streptococcus group g - MIC*    CLINDAMYCIN <=0.25 SENSITIVE Sensitive     AMPICILLIN <=0.25 SENSITIVE Sensitive     ERYTHROMYCIN <=0.12 SENSITIVE Sensitive     VANCOMYCIN 0.5 SENSITIVE Sensitive     CEFTRIAXONE <=0.12 SENSITIVE Sensitive     LEVOFLOXACIN 1 SENSITIVE Sensitive     * STREPTOCOCCUS GROUP G  Blood Culture ID Panel (Reflexed)     Status: Abnormal   Collection Time: 12/09/16 12:45 PM  Result Value Ref Range Status    Enterococcus species NOT DETECTED NOT DETECTED Final   Listeria monocytogenes NOT DETECTED NOT DETECTED Final   Staphylococcus species NOT DETECTED NOT DETECTED Final   Staphylococcus aureus NOT DETECTED NOT DETECTED Final  Streptococcus species DETECTED (A) NOT DETECTED Final    Comment: Not Enterococcus species, Streptococcus agalactiae, Streptococcus pyogenes, or Streptococcus pneumoniae. CRITICAL RESULT CALLED TO, READ BACK BY AND VERIFIED WITH: Hughie Closs, RPHARMD AT [phone] ON 12/10/16 BY C. JESSUP, MLT.    Streptococcus agalactiae NOT DETECTED NOT DETECTED Final   Streptococcus pneumoniae NOT DETECTED NOT DETECTED Final   Streptococcus pyogenes NOT DETECTED NOT DETECTED Final   Acinetobacter baumannii NOT DETECTED NOT DETECTED Final   Enterobacteriaceae species NOT DETECTED NOT DETECTED Final   Enterobacter cloacae complex NOT DETECTED NOT DETECTED Final   Escherichia coli NOT DETECTED NOT DETECTED Final   Klebsiella oxytoca NOT DETECTED NOT DETECTED Final   Klebsiella pneumoniae NOT DETECTED NOT DETECTED Final   Proteus species NOT DETECTED NOT DETECTED Final   Serratia marcescens NOT DETECTED NOT DETECTED Final   Haemophilus influenzae NOT DETECTED NOT DETECTED Final   Neisseria meningitidis NOT DETECTED NOT DETECTED Final   Pseudomonas aeruginosa NOT DETECTED NOT DETECTED Final   Candida albicans NOT DETECTED NOT DETECTED Final   Candida glabrata NOT DETECTED NOT DETECTED Final   Candida krusei NOT DETECTED NOT DETECTED Final   Candida parapsilosis NOT DETECTED NOT DETECTED Final   Candida tropicalis NOT DETECTED NOT DETECTED Final    Comment: Performed at [facility], [place] [address], [place], [place] [address]  Culture, blood (Routine X 2) w Reflex to ID Panel     Status: None (Preliminary result)   Collection Time: 12/10/16  3:58 PM  Result Value Ref Range Status   Specimen Description BLOOD LEFT HAND  Final   Special Requests   Final    BOTTLES DRAWN AEROBIC AND  ANAEROBIC Blood Culture adequate volume   Culture NO GROWTH < 24 HOURS  Final   Report Status PENDING  Incomplete  Culture, blood (Routine X 2) w Reflex to ID Panel     Status: None (Preliminary result)   Collection Time: 12/10/16  3:58 PM  Result Value Ref Range Status   Specimen Description BLOOD LEFT ANTECUBITAL  Final   Special Requests   Final    BOTTLES DRAWN AEROBIC AND ANAEROBIC Blood Culture adequate volume   Culture NO GROWTH < 24 HOURS  Final   Report Status PENDING  Incomplete    Studies/Results: Mr Tibia Fibula Left Wo Contrast  Result Date: 12/12/2016 CLINICAL DATA:  Left lower leg swelling.  Bacteremia. EXAM: MRI OF LOWER LEFT EXTREMITY WITHOUT CONTRAST TECHNIQUE: Multiplanar, multisequence MR imaging of the left tibia and fibula was performed. No intravenous contrast was administered. COMPARISON:  None. FINDINGS: Bones/Joint/Cartilage No suspicious marrow signal. No evidence of osteomyelitis. No acute fracture or dislocation. Ligaments The knee and ankle ligaments are not well evaluated due to field of view. Muscles and Tendons No muscle edema. Mild diffuse fatty atrophy of the lower leg muscles involving all compartments in both legs. The visualized tendons are intact. Soft tissues Moderate diffuse soft tissue swelling about the left lower leg. Mild soft tissue swelling along the medial right lower leg. No drainable fluid collection. IMPRESSION: 1. Moderate diffuse soft tissue swelling about the left lower leg, nonspecific. No evidence of deep soft tissue infection, drainable fluid collection, or osteomyelitis. Electronically Signed   By: Titus Dubin M.D.   On: 12/12/2016 08:01      Assessment/Plan:  INTERVAL HISTORY:   TEE negative for vegetations, MRI is without osteo or    Principal Problem:   Bacteremia due to Streptococcus Active Problems:   Hyperlipidemia  Obstructive sleep apnea   Essential hypertension   CAD (coronary artery disease)   Febrile  illness   Febrile illness, acute   Sepsis (HCC)   Lactic acidosis   Elevated troponin   Renal insufficiency   Hypophosphatemia   Left leg swelling   Generalized weakness    [name] is a 76 y.o. male with  Recurrent Group G streptococcal bacteremia  --I am putting in OPAT and would treat for 2 weeks with CTX --will check surveillance cultures 2 weeks AFTER finishing abx  Diagnosis: Bacteremia Culture Result: Group G strep  Allergies  Allergen Reactions  . Ramipril Cough    OPAT Orders Discharge antibiotics: Per pharmacy protocol ceftriaxone 2 IV daily    End Date: October 24th  [place] Per Protocol:  Labs weekly while on IV antibiotics: _x_ CBC with differential _x_ BMP   _x_ Please pull PIC at completion of IV antibiotics __ Please leave PIC in place until doctor has seen patient or been notified  Fax weekly labs to [phone]  Clinic Follow Up Appt:  54 weeks   LOS: 2 days   Alcide Evener 12/12/2016, 10:19 AM

## 2016-12-12 NOTE — Care Management Note (Signed)
Case Management Note  Patient Details  Name: [name] MRN: [id number] Date of Birth: 10-16-1940  Subjective/Objective:                 Spoke w patients wife, she would like to use [facility] for [facility] RN and IV Abx. Requested [facility] RN and IV Abx orders from Dr Alfredia Ferguson. Notified Jerm[facility] of [place] today after PICC placement, referral accepted. IV Abx next dose will be tomorrow ~12:00.    Action/Plan:  DC to home w wife and IV Abx.  Expected Discharge Date:                  Expected Discharge Plan:  [place]  In-House Referral:     Discharge planning Services  CM Consult  Post Acute Care Choice:  Home Health Choice offered to:  Spouse  DME Arranged:  IV pump/equipment DME Agency:  [place] Arranged:  RN [facility] Agency:  [place]  Status of Service:  Completed, signed off  If discussed at [place] of Stay Meetings, dates discussed:    Additional Comments:  Carles Collet, RN 12/12/2016, 12:02 PM

## 2016-12-12 NOTE — Progress Notes (Signed)
Patient discharged to home, IV removed, prescriptions provided. patient confirmed they had all belongings, patient left floor via wheelchair with wife and staff member

## 2016-12-12 NOTE — Discharge Summary (Signed)
Physician Discharge Summary  [name] WER:[id number] DOB: 22-Oct-1940 DOA: 12/09/2016  PCP: Rogers Blocker, MD  Admit date: 12/09/2016 Discharge date: 12/12/2016  Admitted From: Home Disposition: Home with Home Health  Recommendations for Outpatient Follow-up:  1. Follow up with PCP in 1-2 weeks 2. Follow up with Infectious Diseases in 2 weeks 3. Complete Abx Course until October 24th 4. Follow up with Cardiology as an outpatient for continued Management of CAD and Heart Failure  5. Please obtain CMP/CBC, Mag Phos in one week; Obtain Weekly BMP and CBC with Diff 6. Have Midline Removed after Completion of Abx 7. Please follow up on the following pending results:  Home Health: Yes Equipment/Devices: None    Discharge Condition: Stable CODE STATUS: FULL CODE Diet recommendation: Heart Healthy Diet  Brief/Interim Summary: Shane T Foustis a 76 y.o.malewith medical history significant of CAD, HTN, OSA, HLD, Hx of Streptococcus G Bacteremia who presented with over a day of chills and subjective fever. No rash. No headache. No n/v/d. No urinary symptoms such as dysuria or hematuria. No cough. No sob. No pain in chest or abdomen. No nasal congestion or any uri symptoms. Went to urgent care found to have high temp over 103 and for some reason a troponin was checked which was 0.03. Therefore pt was referred for admission for possible ACS. Patient found to be septic with a Streptococcus Bacteremia with no evident source. Discussed Case with ID and will need a TEE. Of note Patient had a Hx of Streptococcus Bacteremia in 2010 with a Abscess in Thigh. Patient is improving however obtained formal ID Consult and are to see the patient as no source of Bacteremia was identified as patient has no skin tears and no changes in dentition. Patient to underwent TEE on 12/11/16 and showed no evidence of Endocarditis. He also underwent MRI of Leg which showed Moderate diffuse soft tissue swelling about  the left lower leg, nonspecific. No evidence of deep soft tissue infection, drainable fluid collection, or osteomyelitis. Because no source was found ID recommended continuing IV Abx with Ceftriaxone for 2 weeks and will be completed 12/23/16. A Midline was placed after repeat Blood Cx showed NGTD date and Case Management was consulted. He was deemed medically stable to D/C Home and follow up with PCP, Infectious Diseases, and Cardiology an outpatient.  Discharge Diagnoses:  Principal Problem:   Bacteremia due to Streptococcus Active Problems:   Hyperlipidemia   Obstructive sleep apnea   Essential hypertension   CAD (coronary artery disease)   Febrile illness   Febrile illness, acute   Sepsis (HCC)   Lactic acidosis   Elevated troponin   Renal insufficiency   Hypophosphatemia   Left leg swelling   Generalized weakness  Sepsis 2/2 to Streptococcus Bacteremia, improved -Patient was Febrile at (103.1), Tachycardic, Tachypenic, and had a Leukocytosis and now has a Gram Positive Cocci Bacteremia on Admission -Blood Cx x2 Positive -Changed IV Vanc and Zosyn to IV Ceftriaxone 2 grams q24h -Discussed case with Dr. Tommy Medal of Infectious Diseases and recommends obtaining a TEE -Formally Consulted Infectious Diseases Dr. Tommy Medal -Checked ECHOCARDIOGRAM; Obtained TEE -WBC went from 16.1 -> 19.2 -> 14.8 -> 9.8 -Repeat Blood Cx Showed NGTD  -Admission CXR showed Stable sequelae of CABG and mediastinal contours. Tortuous thoracic aorta. Stable lung volumes. No pneumothorax, pulmonary edema, pleural effusion or confluent pulmonary opacity. Mild eventration of the diaphragm. No acute osseous abnormality identified. Negative visible bowel gas pattern -Urinalysis was Clear   -Appreciate ID  Recc's -TTE and TEE Results below -MRI done of leg and showed no source of infection -ID recommended treatment with 2 week Abx Course -Midline Placed and patient discharged on IV 2 grams of Ceftriaxone  q24h -Follow up with PCP and ID as an outpatient   Lactic Acidosis -Lactic Acid Level went from 2.40 -> 2.20 -> 1.0 -> 2.0 -C/w IVF NS at 100 mL/hr; Gave a Bolus of NS at 500 mL on 12/10/16  Elevated Troponin -Elevated from 0.03 -> 0.24 -> 0.31 -> 0.29 -> 0.21 -> 0.25; Flat and not indicative of ACS -Denies Chest Pain -Likely in the Setting of Sepsis  -Follow up with Cardiology as an outpatient   Obstructive Sleep Apnea -C/w Home CPAP  Renal Insufficiency/CKD Stage 3, improving -Unknown Baseline -BUN/Cr went from 30/1.49 -> 25/1.61 -> 20/1.55 -> 16/1.40 -> 16/1.37 -Stopped IVF with NS at 100 mL/hr -Resume Lasix, Olmesartan, and Allopurinol Currently -Repeat CMP as an outpatient   Hypophosphatemia -Patient's Phos Level was 2.3 -Replete with po KPhos -Continue to Monitor and Replete as Necessary -Replete Phos Level as an outpatient   Hx of CAD s/p CABGx4 -C/w ASA 81 mg po Daily, Metoprolol 25 mg po BID, and Pravastatin 20 mg po Daily -Troponin Trending down and flat and likely 2/2 to Sepsis -TTE showed Systolic function was normal. The estimated ejection fraction was in the range of 55% to 60%. Wall motion was normal; there were no regional wall motion abnormalities. Features are consistent with a pseudonormal left ventricular filling pattern, with concomitant abnormal relaxation and increased filling pressure (grade 2 diastolic dysfunction). -Follow up with Cardiology as an outpateint   Hypertension -BP Currently 153/96 -Resume Home Amlodipine 2.5 mg po Daily, Olmesartam 40 mg po Daily, Lasix 20 mg po BID  HLD -C/w Lovastatin 20 mg po Daily   Gout -Can resume Home Allopurinol   Left Leg Swelling -U/S done and showed no evidence of DVT within the LLE -No Erythema on Leg  -MRI showed Moderate diffuse soft tissue swelling about the left lower leg, nonspecific. No evidence of deep soft tissue infection, drainable fluid collection, or osteomyelitis.  Chronic  Diastolic CHF -Grade 2 DD -Appeared not to be Decompensated -Had some LE -C/w Home Diuretics and ACE -Follow up with Cardiology as an outpatient   Discharge Instructions  Discharge Instructions    Call MD for:  difficulty breathing, headache or visual disturbances    Complete by:  As directed    Call MD for:  extreme fatigue    Complete by:  As directed    Call MD for:  hives    Complete by:  As directed    Call MD for:  persistant dizziness or light-headedness    Complete by:  As directed    Call MD for:  persistant nausea and vomiting    Complete by:  As directed    Call MD for:  redness, tenderness, or signs of infection (pain, swelling, redness, odor or green/yellow discharge around incision site)    Complete by:  As directed    Call MD for:  severe uncontrolled pain    Complete by:  As directed    Call MD for:  temperature >100.4    Complete by:  As directed    Diet - low sodium heart healthy    Complete by:  As directed    Diet - low sodium heart healthy    Complete by:  As directed    Discharge instructions    Complete  by:  As directed    Follow up with PCP and Infectious Diseases as an outpatient. Take all medications as prescribed. If symptoms change or worsen please return to the ED for evaluation.   Home infusion instructions [facility] May follow [place] Dosing Protocol; May administer Cathflo as needed to maintain patency of vascular access device.; Flushing of vascular access device: per [facility] Protocol: 0.9% NaCl pre/post medica...    Complete by:  As directed    Instructions:  May follow [place] Dosing Protocol   Instructions:  May administer Cathflo as needed to maintain patency of vascular access device.   Instructions:  Flushing of vascular access device: per [facility] Protocol: 0.9% NaCl pre/post medication administration and prn patency; Heparin 100 u/ml, 53m for implanted ports and Heparin 10u/ml, 537mfor all other central venous catheters.    Instructions:  May follow [facility] Anaphylaxis Protocol for First Dose Administration in the home: 0.9% NaCl at 25-50 ml/hr to maintain IV access for protocol meds. Epinephrine 0.3 ml IV/IM PRN and Benadryl 25-50 IV/IM PRN s/s of anaphylaxis.   Instructions:  [facility] Coordinator (RN) to assist per patient IV care needs in the home PRN.   Increase activity slowly    Complete by:  As directed    Increase activity slowly    Complete by:  As directed      Allergies as of 12/12/2016      Reactions   Ramipril Cough      Medication List    TAKE these medications   allopurinol 100 MG tablet Commonly known as:  ZYLOPRIM Take 100 mg by mouth daily.   amLODipine 2.5 MG tablet Commonly known as:  NORVASC TAKE ONE TABLET BY MOUTH ONCE DAILY. MUST HAVE APPOINTMENT BEFORE FURTHER REFILLS.   aspirin 81 MG tablet Take 81 mg by mouth daily.   cefTRIAXone IVPB Commonly known as:  ROCEPHIN Inject 2 g into the vein daily. Indication:  Streptococcus G Bacteremia Last Day of Therapy:  12/23/16 Labs - Once weekly:  CBC/D and BMP, Labs - Every other week:  ESR and CRP   furosemide 20 MG tablet Commonly known as:  LASIX Take 20 mg by mouth 2 (two) times daily.   lovastatin 20 MG tablet Commonly known as:  MEVACOR TAKE ONE TABLET BY MOUTH TWICE DAILY. MUST HAVE OFFICE VISIT BEFORE FURTHER REFILL. What changed:  See the new instructions.   metoprolol tartrate 25 MG tablet Commonly known as:  LOPRESSOR TAKE ONE TABLET BY MOUTH TWICE DAILY   montelukast 10 MG tablet Commonly known as:  SINGULAIR Take 10 mg by mouth at bedtime.   niacin 500 MG CR tablet Commonly known as:  NIASPAN Take 500 mg by mouth at bedtime.   olmesartan 40 MG tablet Commonly known as:  BENICAR Take 40 mg by mouth daily.   phosphorus 155-852-130 MG tablet Commonly known as:  K PHOS NEUTRAL Take 2 tablets (500 mg total) by mouth 3 (three) times daily.   polyethylene glycol powder powder Commonly known  as:  GLYCOLAX/MIRALAX Take 17 g by mouth daily as needed for constipation.            Home Infusion Instuctions        Start     Ordered   12/12/16 0000  Home infusion instructions [facility] May follow [facility] Protocol; May administer Cathflo as needed to maintain patency of vascular access device.; Flushing of vascular access device: [facility]: 0.9%  NaCl pre/post medica...    Question Answer Comment  Instructions May follow [place] Dosing Protocol   Instructions May administer Cathflo as needed to maintain patency of vascular access device.   Instructions Flushing of vascular access device: per [facility] Protocol: 0.9% NaCl pre/post medication administration and prn patency; Heparin 100 u/ml, 41m for implanted ports and Heparin 10u/ml, 534mfor all other central venous catheters.   Instructions May follow [facility] Anaphylaxis Protocol for First Dose Administration in the home: 0.9% NaCl at 25-50 ml/hr to maintain IV access for protocol meds. Epinephrine 0.3 ml IV/IM PRN and Benadryl 25-50 IV/IM PRN s/s of anaphylaxis.   Instructions [facility] Infusion Coordinator (RN) to assist per patient IV care needs in the home PRN.      12/12/16 1227     Follow-up Information    [facility], [facility]-Home Follow up.   Why:  For home health RN and IV Abx. Contact information: [phone]        DeRogers BlockerMD. Call.   Specialty:  Internal Medicine Why:  Follow up within 1 week Contact information: [address]avell IslamMD. Call in 2 week(s).   Specialty:  Infectious Diseases Why:  Follow up in 2 weeks for Hospital Follow up for Bacteremia Contact information: [phone]        Allergies  Allergen Reactions  . Ramipril Cough   Consultations:  Infectious  Diseases  Cardiology for TEE  Procedures/Studies: Dg Chest 2 View  Result Date: 12/09/2016 CLINICAL DATA:  [age]ear old male with weakness fever chills nausea and vomiting since last night. EXAM: CHEST  2 VIEW COMPARISON:  10/08/2008 and earlier. FINDINGS: Stable sequelae of CABG and mediastinal contours. Tortuous thoracic aorta. Stable lung volumes. No pneumothorax, pulmonary edema, pleural effusion or confluent pulmonary opacity. Mild eventration of the diaphragm. No acute osseous abnormality identified. Negative visible bowel gas pattern. IMPRESSION: No acute cardiopulmonary abnormality. Electronically Signed   By: H Genevie Ann.D.   On: 12/09/2016 12:43   Mr Tibia Fibula Left Wo Contrast  Result Date: 12/12/2016 CLINICAL DATA:  Left lower leg swelling.  Bacteremia. EXAM: MRI OF LOWER LEFT EXTREMITY WITHOUT CONTRAST TECHNIQUE: Multiplanar, multisequence MR imaging of the left tibia and fibula was performed. No intravenous contrast was administered. COMPARISON:  None. FINDINGS: Bones/Joint/Cartilage No suspicious marrow signal. No evidence of osteomyelitis. No acute fracture or dislocation. Ligaments The knee and ankle ligaments are not well evaluated due to field of view. Muscles and Tendons No muscle edema. Mild diffuse fatty atrophy of the lower leg muscles involving all compartments in both legs. The visualized tendons are intact. Soft tissues Moderate diffuse soft tissue swelling about the left lower leg. Mild soft tissue swelling along the medial right lower leg. No drainable fluid collection. IMPRESSION: 1. Moderate diffuse soft tissue swelling about the left lower leg, nonspecific. No evidence of deep soft tissue infection, drainable fluid collection, or osteomyelitis. Electronically Signed   By: WiTitus Dubin.D.   On: 12/12/2016 08:01   UsKoreaenous Img Lower Unilateral Left  Result Date: 12/09/2016 CLINICAL DATA:  Bilateral lower extremity swelling greater on the left than on the right. No  pain. Patient also reports weakness and chills and nausea vomiting since 2 a.m. today. EXAM: Left LOWER EXTREMITY VENOUS DOPPLER ULTRASOUND TECHNIQUE: Gray-scale sonography with graded compression,  as well as color Doppler and duplex ultrasound were performed to evaluate the lower extremity deep venous systems from the level of the common femoral vein and including the common femoral, femoral, profunda femoral, popliteal and calf veins including the posterior tibial, peroneal and gastrocnemius veins when visible. The superficial great saphenous vein was also interrogated. Spectral Doppler was utilized to evaluate flow at rest and with distal augmentation maneuvers in the common femoral, femoral and popliteal veins. COMPARISON:  None. FINDINGS: Contralateral Common Femoral Vein: Respiratory phasicity is normal and symmetric with the symptomatic side. No evidence of thrombus. Normal compressibility. Common Femoral Vein: No evidence of thrombus. Normal compressibility, respiratory phasicity and response to augmentation. Saphenofemoral Junction: No evidence of thrombus. Normal compressibility and flow on color Doppler imaging. Profunda Femoral Vein: No evidence of thrombus. Normal compressibility and flow on color Doppler imaging. Femoral Vein: No evidence of thrombus. Normal compressibility, respiratory phasicity and response to augmentation. Popliteal Vein: No evidence of thrombus. Normal compressibility, respiratory phasicity and response to augmentation. Calf Veins: Normal appearance of the posterior tibial vein. The peroneal vein could not be visualized. Superficial Great Saphenous Vein: No evidence of thrombus. Normal compressibility and flow on color Doppler imaging. Venous Reflux:  None. 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 Enlarged left inguinal lymph node measuring 4.4 x 1.2 cm. IMPRESSION: No evidence of DVT within the left lower extremity. Electronically Signed   By: David  Martinique M.D.    On: 12/09/2016 13:43    TRANSTHORACIC ECHOCARDIOGRAM Study Conclusions  - Left ventricle: The cavity size was normal. There was mild   concentric hypertrophy. Systolic function was normal. The   estimated ejection fraction was in the range of 55% to 60%. Wall   motion was normal; there were no regional wall motion   abnormalities. Features are consistent with a pseudonormal left   ventricular filling pattern, with concomitant abnormal relaxation   and increased filling pressure (grade 2 diastolic dysfunction). - Left atrium: The atrium was moderately dilated. - Atrial septum: There was an atrial septal aneurysm. - Pulmonary arteries: PA peak pressure: 33 mm Hg (S).  Impressions:  - There was no evidence of a vegetation. A highly mobile   filamentous struture is seen in the right atrium. It is not   attached to the tricuspid valve. In the absence of indwelling   venous catheters or pacemaker leads, a Chiari network is heavily   favored over a vegetation.  Recommendations:  Consider transesophageal echocardiography if clinically indicated.  TRANSESOPHAGEAL ECHOCARDIOGRAM  IMPRESSION:   1. No evidence for endocarditis 2. No LAA thrombus 3. Negative for PFO 4. Large chiari network (congenital remnant) 5. LVEF 55-60%  RECOMMENDATIONS:    1.  No evidence for endocarditis.  Subjective: Seen and examined at bedside and had no complaints. Felt better. No CP or SOB. Ready to go home.    Discharge Exam: Vitals:   12/12/16 0520 12/12/16 0900  BP: (!) 159/78 (!) 149/77  Pulse: 90 92  Resp: (!) 22   Temp: (!) 100.4 F (38 C) 98.5 F (36.9 C)  SpO2: 97% 97%   Vitals:   12/11/16 1832 12/11/16 2343 12/12/16 0520 12/12/16 0900  BP: (!) 175/91 (!) 164/95 (!) 159/78 (!) 149/77  Pulse: 77 78 90 92  Resp: (!) 22 20 (!) 22   Temp: 99.4 F (37.4 C) 99.8 F (37.7 C) (!) 100.4 F (38 C) 98.5 F (36.9 C)  TempSrc: Oral Oral Oral Oral  SpO2: 98% 98% 97% 97%  Weight:  132.5 kg (292 lb 1.6 oz)    Height:       General: Pt is alert, awake, not in acute distress Cardiovascular: RRR, S1/S2 +, no rubs, no gallops Respiratory: CTA bilaterally, no wheezing, no rhonchi Abdominal: Soft, NT, ND, bowel sounds + Extremities: Has some LE edema, no cyanosis  The results of significant diagnostics from this hospitalization (including imaging, microbiology, ancillary and laboratory) are listed below for reference.    Microbiology: Recent Results (from the past 240 hour(s))  Culture, blood (Routine X 2) w Reflex to ID Panel     Status: Abnormal   Collection Time: 12/09/16 12:10 PM  Result Value Ref Range Status   Specimen Description BLOOD RIGHT ANTECUBITAL  Final   Special Requests   Final    BOTTLES DRAWN AEROBIC AND ANAEROBIC Blood Culture adequate volume   Culture  Setup Time   Final    GRAM POSITIVE COCCI IN CHAINS IN BOTH AEROBIC AND ANAEROBIC BOTTLES CRITICAL RESULT CALLED TO, READ BACK BY AND VERIFIED WITH: M.MACCIA, RPHARMD AT [place] ON 12/10/16 BY C. JESSUP, MLT.    Culture (A)  Final    STREPTOCOCCUS GROUP G SUSCEPTIBILITIES PERFORMED ON PREVIOUS CULTURE WITHIN THE LAST 5 DAYS. Performed at [facility], [place] [address], [place], [place] [address]    Report Status 12/12/2016 FINAL  Final  Culture, blood (Routine X 2) w Reflex to ID Panel     Status: Abnormal   Collection Time: 12/09/16 12:45 PM  Result Value Ref Range Status   Specimen Description BLOOD LEFT ANTECUBITAL  Final   Special Requests   Final    BOTTLES DRAWN AEROBIC AND ANAEROBIC Blood Culture results may not be optimal due to an excessive volume of blood received in culture bottles   Culture  Setup Time   Final    GRAM POSITIVE COCCI IN CHAINS IN BOTH AEROBIC AND ANAEROBIC BOTTLES CRITICAL RESULT CALLED TO, READ BACK BY AND VERIFIED WITH: Hughie Closs, [place] AT [id number] ON 12/10/16 BY C. JESSUP, MLT. Performed at [facility], [place] [address] [place], [place] [address]     Culture STREPTOCOCCUS GROUP G (A)  Final   Report Status 12/12/2016 FINAL  Final   Organism ID, Bacteria STREPTOCOCCUS GROUP G  Final      Susceptibility   Streptococcus group g - MIC*    CLINDAMYCIN <=0.25 SENSITIVE Sensitive     AMPICILLIN <=0.25 SENSITIVE Sensitive     ERYTHROMYCIN <=0.12 SENSITIVE Sensitive     VANCOMYCIN 0.5 SENSITIVE Sensitive     CEFTRIAXONE <=0.12 SENSITIVE Sensitive     LEVOFLOXACIN 1 SENSITIVE Sensitive     * STREPTOCOCCUS GROUP G  Blood Culture ID Panel (Reflexed)     Status: Abnormal   Collection Time: 12/09/16 12:45 PM  Result Value Ref Range Status   Enterococcus species NOT DETECTED NOT DETECTED Final   Listeria monocytogenes NOT DETECTED NOT DETECTED Final   Staphylococcus species NOT DETECTED NOT DETECTED Final   Staphylococcus aureus NOT DETECTED NOT DETECTED Final   Streptococcus species DETECTED (A) NOT DETECTED Final    Comment: Not Enterococcus species, Streptococcus agalactiae, Streptococcus pyogenes, or Streptococcus pneumoniae. CRITICAL RESULT CALLED TO, READ BACK BY AND VERIFIED WITH: Hughie Closs, RPHARMD AT [phone] ON 12/10/16 BY C. JESSUP, MLT.    Streptococcus agalactiae NOT DETECTED NOT DETECTED Final   Streptococcus pneumoniae NOT DETECTED NOT DETECTED Final   Streptococcus pyogenes NOT DETECTED NOT DETECTED Final   Acinetobacter baumannii NOT DETECTED NOT DETECTED Final  Enterobacteriaceae species NOT DETECTED NOT DETECTED Final   Enterobacter cloacae complex NOT DETECTED NOT DETECTED Final   Escherichia coli NOT DETECTED NOT DETECTED Final   Klebsiella oxytoca NOT DETECTED NOT DETECTED Final   Klebsiella pneumoniae NOT DETECTED NOT DETECTED Final   Proteus species NOT DETECTED NOT DETECTED Final   Serratia marcescens NOT DETECTED NOT DETECTED Final   Haemophilus influenzae NOT DETECTED NOT DETECTED Final   Neisseria meningitidis NOT DETECTED NOT DETECTED Final   Pseudomonas aeruginosa NOT DETECTED NOT DETECTED Final   Candida  albicans NOT DETECTED NOT DETECTED Final   Candida glabrata NOT DETECTED NOT DETECTED Final   Candida krusei NOT DETECTED NOT DETECTED Final   Candida parapsilosis NOT DETECTED NOT DETECTED Final   Candida tropicalis NOT DETECTED NOT DETECTED Final    Comment: Performed at [facility], [place] [address], [place], [place] [address]  Culture, blood (Routine X 2) w Reflex to ID Panel     Status: None (Preliminary result)   Collection Time: 12/10/16  3:58 PM  Result Value Ref Range Status   Specimen Description BLOOD LEFT HAND  Final   Special Requests   Final    BOTTLES DRAWN AEROBIC AND ANAEROBIC Blood Culture adequate volume   Culture NO GROWTH < 24 HOURS  Final   Report Status PENDING  Incomplete  Culture, blood (Routine X 2) w Reflex to ID Panel     Status: None (Preliminary result)   Collection Time: 12/10/16  3:58 PM  Result Value Ref Range Status   Specimen Description BLOOD LEFT ANTECUBITAL  Final   Special Requests   Final    BOTTLES DRAWN AEROBIC AND ANAEROBIC Blood Culture adequate volume   Culture NO GROWTH < 24 HOURS  Final   Report Status PENDING  Incomplete   Labs: BNP (last 3 results)  Recent Labs  12/09/16 1209  BNP 35.4   Basic Metabolic Panel:  Recent Labs Lab 12/09/16 1209 12/10/16 0757 12/11/16 0125 12/11/16 0705 12/12/16 1100  NA 138 141 139 140 133*  K 4.5 4.1 3.8 4.0 3.7  CL 108 113* 110 113* 106  CO2 22 21* 21* 22 19*  GLUCOSE 114* 90 136* 95 162*  BUN 30* 25* _0 CREATININE 1.49* 1.61* 1.55* 1.40* 1.37*  CALCIUM 9.5 9.1 9.0 9.1 8.7*  MG  --  1.8 1.8  --  1.8  PHOS  --  2.1* 1.7*  --  2.3*   Liver Function Tests:  Recent Labs Lab 12/09/16 1209 12/10/16 0757 12/11/16 0125 12/12/16 1100  AST _1 ALT 16* 17 14* 17  ALKPHOS 54 45 49 54  BILITOT 0.5 1.1 0.4 0.6  PROT 8.0 6.6 6.2* 7.0  ALBUMIN 3.8 3.0* 2.7* 2.8*   No results for input(s): LIPASE, AMYLASE in the last 168 hours. No results for input(s): AMMONIA in the  last 168 hours. CBC:  Recent Labs Lab 12/09/16 1209 12/10/16 0757 12/11/16 0125 12/12/16 1100  WBC 16.1* 19.2* 14.8* 9.8  NEUTROABS 14.0* 16.7* 11.3* 7.2  HGB 12.3* 11.4* 10.2* 10.5*  HCT 37.4* 35.5* 30.4* 32.7*  MCV 84.0 84.3 83.5 83.6  PLT 197 173 172 191   Cardiac Enzymes:  Recent Labs Lab 12/09/16 2316 12/10/16 0347 12/10/16 0757 12/10/16 1407 12/10/16 1951  TROPONINI 0.24* 0.31* 0.29* 0.21* 0.25*   BNP: Invalid input(s): POCBNP CBG: No results for input(s): GLUCAP in the last 168 hours. D-Dimer No results for input(s): DDIMER in the last 72 hours.  Hgb A1c No results for input(s): HGBA1C in the last 72 hours. Lipid Profile No results for input(s): CHOL, HDL, LDLCALC, TRIG, CHOLHDL, LDLDIRECT in the last 72 hours. Thyroid function studies No results for input(s): TSH, T4TOTAL, T3FREE, THYROIDAB in the last 72 hours.  Invalid input(s): FREET3 Anemia work up No results for input(s): VITAMINB12, FOLATE, FERRITIN, TIBC, IRON, RETICCTPCT in the last 72 hours. Urinalysis    Component Value Date/Time   COLORURINE YELLOW 12/09/2016 1410   APPEARANCEUR CLEAR 12/09/2016 1410   LABSPEC <1.005 (L) 12/09/2016 1410   PHURINE 6.5 12/09/2016 1410   GLUCOSEU NEGATIVE 12/09/2016 1410   HGBUR NEGATIVE 12/09/2016 1410   BILIRUBINUR NEGATIVE 12/09/2016 1410   KETONESUR NEGATIVE 12/09/2016 1410   PROTEINUR NEGATIVE 12/09/2016 1410   UROBILINOGEN 1.0 10/08/2008 2214   NITRITE NEGATIVE 12/09/2016 1410   LEUKOCYTESUR NEGATIVE 12/09/2016 1410   Sepsis Labs Invalid input(s): PROCALCITONIN,  WBC,  LACTICIDVEN Microbiology Recent Results (from the past 240 hour(s))  Culture, blood (Routine X 2) w Reflex to ID Panel     Status: Abnormal   Collection Time: 12/09/16 12:10 PM  Result Value Ref Range Status   Specimen Description BLOOD RIGHT ANTECUBITAL  Final   Special Requests   Final    BOTTLES DRAWN AEROBIC AND ANAEROBIC Blood Culture adequate volume   Culture  Setup Time    Final    GRAM POSITIVE COCCI IN CHAINS IN BOTH AEROBIC AND ANAEROBIC BOTTLES CRITICAL RESULT CALLED TO, READ BACK BY AND VERIFIED WITH: M.MACCIA, RPHARMD AT [place] ON 12/10/16 BY C. JESSUP, MLT.    Culture (A)  Final    STREPTOCOCCUS GROUP G SUSCEPTIBILITIES PERFORMED ON PREVIOUS CULTURE WITHIN THE LAST 5 DAYS. Performed at [facility], [place] [address], [place], [place] [address]    Report Status 12/12/2016 FINAL  Final  Culture, blood (Routine X 2) w Reflex to ID Panel     Status: Abnormal   Collection Time: 12/09/16 12:45 PM  Result Value Ref Range Status   Specimen Description BLOOD LEFT ANTECUBITAL  Final   Special Requests   Final    BOTTLES DRAWN AEROBIC AND ANAEROBIC Blood Culture results may not be optimal due to an excessive volume of blood received in culture bottles   Culture  Setup Time   Final    GRAM POSITIVE COCCI IN CHAINS IN BOTH AEROBIC AND ANAEROBIC BOTTLES CRITICAL RESULT CALLED TO, READ BACK BY AND VERIFIED WITH: Hughie Closs, [place] AT 2703 ON 12/10/16 BY C. JESSUP, MLT. Performed at [facility], [place] [address] [place], [place] [address]    Culture STREPTOCOCCUS GROUP G (A)  Final   Report Status 12/12/2016 FINAL  Final   Organism ID, Bacteria STREPTOCOCCUS GROUP G  Final      Susceptibility   Streptococcus group g - MIC*    CLINDAMYCIN <=0.25 SENSITIVE Sensitive     AMPICILLIN <=0.25 SENSITIVE Sensitive     ERYTHROMYCIN <=0.12 SENSITIVE Sensitive     VANCOMYCIN 0.5 SENSITIVE Sensitive     CEFTRIAXONE <=0.12 SENSITIVE Sensitive     LEVOFLOXACIN 1 SENSITIVE Sensitive     * STREPTOCOCCUS GROUP G  Blood Culture ID Panel (Reflexed)     Status: Abnormal   Collection Time: 12/09/16 12:45 PM  Result Value Ref Range Status   Enterococcus species NOT DETECTED NOT DETECTED Final   Listeria monocytogenes NOT DETECTED NOT DETECTED Final   Staphylococcus species NOT DETECTED NOT DETECTED Final   Staphylococcus aureus NOT DETECTED NOT DETECTED Final  Streptococcus species DETECTED (A) NOT DETECTED Final    Comment: Not Enterococcus species, Streptococcus agalactiae, Streptococcus pyogenes, or Streptococcus pneumoniae. CRITICAL RESULT CALLED TO, READ BACK BY AND VERIFIED WITH: Hughie Closs, RPHARMD AT [phone] ON 12/10/16 BY C. JESSUP, MLT.    Streptococcus agalactiae NOT DETECTED NOT DETECTED Final   Streptococcus pneumoniae NOT DETECTED NOT DETECTED Final   Streptococcus pyogenes NOT DETECTED NOT DETECTED Final   Acinetobacter baumannii NOT DETECTED NOT DETECTED Final   Enterobacteriaceae species NOT DETECTED NOT DETECTED Final   Enterobacter cloacae complex NOT DETECTED NOT DETECTED Final   Escherichia coli NOT DETECTED NOT DETECTED Final   Klebsiella oxytoca NOT DETECTED NOT DETECTED Final   Klebsiella pneumoniae NOT DETECTED NOT DETECTED Final   Proteus species NOT DETECTED NOT DETECTED Final   Serratia marcescens NOT DETECTED NOT DETECTED Final   Haemophilus influenzae NOT DETECTED NOT DETECTED Final   Neisseria meningitidis NOT DETECTED NOT DETECTED Final   Pseudomonas aeruginosa NOT DETECTED NOT DETECTED Final   Candida albicans NOT DETECTED NOT DETECTED Final   Candida glabrata NOT DETECTED NOT DETECTED Final   Candida krusei NOT DETECTED NOT DETECTED Final   Candida parapsilosis NOT DETECTED NOT DETECTED Final   Candida tropicalis NOT DETECTED NOT DETECTED Final    Comment: Performed at [facility], [place] [address], [place], [place] [address]  Culture, blood (Routine X 2) w Reflex to ID Panel     Status: None (Preliminary result)   Collection Time: 12/10/16  3:58 PM  Result Value Ref Range Status   Specimen Description BLOOD LEFT HAND  Final   Special Requests   Final    BOTTLES DRAWN AEROBIC AND ANAEROBIC Blood Culture adequate volume   Culture NO GROWTH < 24 HOURS  Final   Report Status PENDING  Incomplete  Culture, blood (Routine X 2) w Reflex to ID Panel     Status: None (Preliminary result)   Collection Time:  12/10/16  3:58 PM  Result Value Ref Range Status   Specimen Description BLOOD LEFT ANTECUBITAL  Final   Special Requests   Final    BOTTLES DRAWN AEROBIC AND ANAEROBIC Blood Culture adequate volume   Culture NO GROWTH < 24 HOURS  Final   Report Status PENDING  Incomplete   Time coordinating discharge: 35 minutes  SIGNED:  Kerney Elbe, DO [facility] 12/12/2016, 2:05 PM Pager [phone]  If 7PM-7AM, please contact night-coverage [email or web address] Password [email or web address]

## 2016-12-13 ENCOUNTER — Encounter (HOSPITAL_COMMUNITY): Payer: Self-pay | Admitting: Internal Medicine

## 2016-12-15 LAB — CULTURE, BLOOD (ROUTINE X 2)
CULTURE: NO GROWTH
Culture: NO GROWTH
SPECIAL REQUESTS: ADEQUATE
Special Requests: ADEQUATE

## 2016-12-24 ENCOUNTER — Other Ambulatory Visit: Payer: Self-pay | Admitting: Pharmacist

## 2017-01-13 ENCOUNTER — Encounter: Payer: Self-pay | Admitting: Infectious Disease

## 2017-01-13 ENCOUNTER — Ambulatory Visit (INDEPENDENT_AMBULATORY_CARE_PROVIDER_SITE_OTHER): Payer: Medicare Other | Admitting: Infectious Disease

## 2017-01-13 VITALS — BP 150/79 | HR 55 | Temp 98.3°F | Ht 74.0 in | Wt 290.0 lb

## 2017-01-13 DIAGNOSIS — R7881 Bacteremia: Secondary | ICD-10-CM | POA: Diagnosis not present

## 2017-01-13 DIAGNOSIS — B999 Unspecified infectious disease: Secondary | ICD-10-CM | POA: Diagnosis not present

## 2017-01-13 DIAGNOSIS — B955 Unspecified streptococcus as the cause of diseases classified elsewhere: Secondary | ICD-10-CM

## 2017-01-13 DIAGNOSIS — Z23 Encounter for immunization: Secondary | ICD-10-CM

## 2017-01-13 NOTE — Progress Notes (Signed)
Subjective:   followup for recurrent bacteremia   Patient ID: [name], male    DOB: [birth year], 76 y.o.   MRN: [id number]  HPI  76 y.o. male with past mental history significant for coronary artery disease status post coronary bypass grafting, hypertension sleep apnea hyperlipidemia, prior group G streptococcal bacteremia associated with left lower externally cellulitis presents to the emerge department with acute onset of sudden fevers to [address]'s malaise. He was evaluated and had a mild elevation in his troponin to 0.03. He was admitted to hospitalist service and blood cultures were drawn. He was initiated obeing a group G streptococcal species AGAIN. He AGAIN had  transesophageal echocardiogram which is failed to show evidence of endocarditis.  He cleared his bacteremia promptly with antibiotics and received 2 weeks of IV ceftriaxone.  PICC line has been taken discontinued.  We will bring him here today to check surveillance cultures I cannot find why he is having these recurrent episodes of bacteremia there is not a clear focal source such as a cellulitis or problems of recurrent edema to explain this.  Past Medical History:  Diagnosis Date  . CAD (coronary artery disease)    s/p cath in 2005 and CABG x4  . Cataract   . DJD (degenerative joint disease)   . DJD (degenerative joint disease)   . Fluid retention    mild  . H/O: gout   . History of blood clots   . HTN (hypertension)   . Hyperlipidemia   . Hyperlipidemia   . Myocardial infarction ([place]) 2009  . Obesity    with reduction  . OSA (obstructive sleep apnea)    AHI 98/hr  . Renal insufficiency   . Sleep apnea   . Stroke [facility]) 2000    Past Surgical History:  Procedure Laterality Date  . CORONARY ARTERY BYPASS GRAFT    . left inguinal hernia repair      Family History  Problem Relation Age of Onset  . Gout Mother   . Stroke Mother   . Coronary artery disease Mother   . Hypertension Mother   .  Arthritis Mother   . Coronary artery disease Father   . Arthritis Father   . Gout Father   . Stroke Father   . Hypertension Father   . Coronary artery disease Brother   . Arthritis Brother   . Gout Brother   . Stroke Brother   . Hypertension Brother   . Coronary artery disease Brother   . Arthritis Brother   . Gout Brother   . Stroke Brother   . Hypertension Brother   . Coronary artery disease Unknown        significant in multiple family members      Social History   Socioeconomic History  . Marital status: Married    Spouse name: [name]  . Number of children: None  . Years of education: None  . Highest education level: None  Social Needs  . Financial resource strain: None  . Food insecurity - worry: None  . Food insecurity - inability: None  . Transportation needs - medical: None  . Transportation needs - non-medical: None  Occupational History  . None  Tobacco Use  . Smoking status: Former Smoker    Types: Cigars  . Smokeless tobacco: Never Used  . Tobacco comment: used to smoke cigars, 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, none since  Substance and Sexual Activity  . Alcohol use: No  . Drug  use: No  . Sexual activity: None  Other Topics Concern  . None  Social History Narrative  . None    Allergies  Allergen Reactions  . Ramipril Cough     Current Outpatient Medications:  .  allopurinol (ZYLOPRIM) 100 MG tablet, Take 100 mg by mouth daily., Disp: , Rfl:  .  aspirin 81 MG tablet, Take 81 mg by mouth daily.  , Disp: , Rfl:  .  furosemide (LASIX) 20 MG tablet, Take 20 mg by mouth 2 (two) times daily.  , Disp: , Rfl:  .  metoprolol tartrate (LOPRESSOR) 25 MG tablet, TAKE ONE TABLET BY MOUTH TWICE DAILY, Disp: 30 tablet, Rfl: 0 .  montelukast (SINGULAIR) 10 MG tablet, Take 10 mg by mouth at bedtime., Disp: , Rfl:  .  niacin (NIASPAN) 500 MG CR tablet, Take 500 mg by mouth at bedtime.  , Disp: , Rfl:  .  olmesartan (BENICAR) 40 MG tablet, Take 40 mg by mouth  daily.  , Disp: , Rfl:  .  amLODipine (NORVASC) 2.5 MG tablet, TAKE ONE TABLET BY MOUTH ONCE DAILY. MUST HAVE APPOINTMENT BEFORE FURTHER REFILLS., Disp: 30 tablet, Rfl: 5 .  lovastatin (MEVACOR) 20 MG tablet, TAKE ONE TABLET BY MOUTH TWICE DAILY. MUST HAVE OFFICE VISIT BEFORE FURTHER REFILL. (Patient taking differently: TAKE ONE 20 mg TABLET BY MOUTH TWICE DAILY. MUST HAVE OFFICE VISIT BEFORE FURTHER REFILL.), Disp: 60 tablet, Rfl: 6 .  phosphorus (K PHOS NEUTRAL) 155-852-130 MG tablet, Take 2 tablets (500 mg total) by mouth 3 (three) times daily., Disp: 3 tablet, Rfl: 0 .  polyethylene glycol powder (GLYCOLAX/MIRALAX) powder, Take 17 g by mouth daily as needed for constipation., Disp: , Rfl:      Review of Systems  Constitutional: Negative for activity change, appetite change, chills, diaphoresis, fatigue, fever and unexpected weight change.  HENT: Negative for congestion, rhinorrhea, sinus pressure, sneezing, sore throat and trouble swallowing.   Eyes: Negative for photophobia and visual disturbance.  Respiratory: Negative for cough, chest tightness, shortness of breath, wheezing and stridor.   Cardiovascular: Negative for chest pain, palpitations and leg swelling.  Gastrointestinal: Negative for abdominal distention, abdominal pain, anal bleeding, blood in stool, constipation, diarrhea, nausea and vomiting.  Genitourinary: Negative for difficulty urinating, dysuria, flank pain and hematuria.  Musculoskeletal: Negative for arthralgias, back pain, gait problem, joint swelling and myalgias.  Skin: Negative for color change, pallor, rash and wound.  Neurological: Negative for dizziness, tremors, weakness and light-headedness.  Hematological: Negative for adenopathy. Does not bruise/bleed easily.  Psychiatric/Behavioral: Negative for agitation, behavioral problems, confusion, decreased concentration, dysphoric mood and sleep disturbance.        Objective:   Physical Exam  Constitutional: He  is oriented to person, place, and time. He appears well-developed and well-nourished.  HENT:  Head: Normocephalic and atraumatic.  Eyes: Conjunctivae and EOM are normal.  Neck: Normal range of motion. Neck supple.  Cardiovascular: Normal rate and regular rhythm.  Pulmonary/Chest: Effort normal. No respiratory distress. He has no wheezes.  Abdominal: Soft. He exhibits no distension.  Musculoskeletal: Normal range of motion. He exhibits no edema or tenderness.  Neurological: He is alert and oriented to person, place, and time.  Skin: Skin is warm and dry. No rash noted. No erythema. No pallor.  Nursing note and vitals reviewed.         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  He is  finished 2 weeks of parenteral antibiotics again.  Will check surveillance cultures.  He can return to clinic as needed.

## 2017-01-19 LAB — CULTURE, BLOOD (SINGLE)
MICRO NUMBER: 81283938
MICRO NUMBER:: 81283937
Result:: NO GROWTH
SPECIMEN QUALITY: ADEQUATE

## 2017-02-09 ENCOUNTER — Ambulatory Visit: Payer: Medicare Other | Admitting: Podiatry

## 2017-08-03 DIAGNOSIS — H903 Sensorineural hearing loss, bilateral: Secondary | ICD-10-CM | POA: Insufficient documentation

## 2017-09-06 NOTE — Progress Notes (Signed)
Cardiology Office Note   Date:  09/07/2017   ID:  Adhrit, Krenz [birth year], MRN [id number]  PCP:  Rogers Blocker, MD  Cardiologist: Dr.  Debara Pickett  Chief Complaint  Patient presents with  . New Patient (Initial Visit)     History of Present Illness: [name] is a 77 y.o. male who presents to be re-established with cardiology at the request of Dr. Marlou Sa, with history of CAD, s/p CABG in 2005 (LIMA to LAD, SVG to to 1st OM, and second obtuse marginal artery, and SVG to RCA). HTN, Dyslipidemia, and gout. He was last seen for cdiovascular assessment after having TEE during hospitalization in October of 2018 in the setting of bacteremia. The TEE was negative for vegetation.   He comes today with complaints of generalized fatigue, DOE, pain in the right leg, and resistant HTN. His wife who is with him states that he cannot walk up two stairs without having to stop and rest. She confirms that he has had substantial decrease in his stamina. He falls asleep easily and has to rest frequently when he is walking. He denies chest pain during exertion.   Past Medical History:  Diagnosis Date  . CAD (coronary artery disease)    s/p cath in 2005 and CABG x4  . Cataract   . DJD (degenerative joint disease)   . DJD (degenerative joint disease)   . Fluid retention    mild  . H/O: gout   . History of blood clots   . HTN (hypertension)   . Hyperlipidemia   . Hyperlipidemia   . Myocardial infarction ([place]) 2009  . Obesity    with reduction  . OSA (obstructive sleep apnea)    AHI 98/hr  . Renal insufficiency   . Sleep apnea   . Stroke [facility]) 2000    Past Surgical History:  Procedure Laterality Date  . CORONARY ARTERY BYPASS GRAFT    . left inguinal hernia repair    . TEE WITHOUT CARDIOVERSION N/A 12/11/2016   Procedure: TRANSESOPHAGEAL ECHOCARDIOGRAM (TEE);  Surgeon: [name], MD;  Location: [facility] ENDOSCOPY;  Service: Cardiovascular;  Laterality: N/A;     Current Outpatient Medications    Medication Sig Dispense Refill  . allopurinol (ZYLOPRIM) 100 MG tablet Take 200 mg by mouth daily.     [name] amLODipine (NORVASC) 5 MG tablet TAKE ONE TABLET BY MOUTH ONCE DAILY. 30 tablet 3  . aspirin 81 MG tablet Take 81 mg by mouth daily.      . furosemide (LASIX) 20 MG tablet Take 20 mg by mouth 2 (two) times daily.      [name] lovastatin (MEVACOR) 20 MG tablet TAKE ONE TABLET BY MOUTH TWICE DAILY. MUST HAVE OFFICE VISIT BEFORE FURTHER REFILL. (Patient taking differently: TAKE ONE 20 mg TABLET BY MOUTH TWICE DAILY. MUST HAVE OFFICE VISIT BEFORE FURTHER REFILL.) 60 tablet 6  . metoprolol tartrate (LOPRESSOR) 25 MG tablet TAKE ONE TABLET BY MOUTH TWICE DAILY 30 tablet 0  . montelukast (SINGULAIR) 10 MG tablet Take 10 mg by mouth at bedtime.    . niacin (NIASPAN) 500 MG CR tablet Take 500 mg by mouth at bedtime.      [name] olmesartan (BENICAR) 40 MG tablet Take 40 mg by mouth daily.      . phosphorus (K PHOS NEUTRAL) 155-852-130 MG tablet Take 2 tablets (500 mg total) by mouth 3 (three) times daily. 3 tablet 0  . polyethylene glycol powder (GLYCOLAX/MIRALAX) powder Take 17 g by mouth  daily as needed for constipation.    . Vitamin D, Cholecalciferol, 1000 units CAPS Take by mouth.     No current facility-administered medications for this visit.     Allergies:   Ramipril    Social History:  The patient  reports that he has quit smoking. His smoking use included cigars. He has never used smokeless tobacco. He reports that he does not drink alcohol or use drugs.   Family History:  The patient's family history includes Arthritis in his brother, brother, father, and mother; Coronary artery disease in his brother, brother, father, mother, and unknown relative; Gout in his brother, brother, father, and mother; Hypertension in his brother, brother, father, and mother; Stroke in his brother, brother, father, and mother.    ROS: All other systems are reviewed and negative. Unless otherwise mentioned in H&P     PHYSICAL EXAM: VS:  BP (!) 161/98 (BP Location: Left Arm)   Pulse (!) 51   Ht 6\' 2"  (1.88 m)   Wt 281 lb 6.4 oz (127.6 kg)   BMI 36.13 kg/m  , BMI Body mass index is 36.13 kg/m. GEN: Well nourished, well developed, in no acute distress Morbidly obese.  HEENT: normal  Neck: no JVD, carotid bruits, or masses Cardiac: RRR; distant heart sounds, no murmurs, rubs, or gallops, woody skin changes with diminished pulse in the right DP and PT.  Respiratory:  Clear to auscultation bilaterally, normal work of breathing GI: soft, nontender, nondistended, + BS MS: no deformity or atrophy  Skin: warm and dry, no rash Neuro:  Strength and sensation are intact Psych: euthymic mood, full affect   EKG:  Sinus bradycardia rate of 51 bpm.   Recent Labs: 12/09/2016: B Natriuretic Peptide 59.6 12/12/2016: ALT 17; BUN 16; Creatinine, Ser 1.37; Hemoglobin 10.5; Magnesium 1.8; Platelets 191; Potassium 3.7; Sodium 133    Lipid Panel    Component Value Date/Time   CHOL  10/10/2008 0412    121        ATP III CLASSIFICATION:  <200     mg/dL   Desirable  200-239  mg/dL   Borderline High  >=240    mg/dL   High          TRIG 71 10/10/2008 0412   HDL 35 (L) 10/10/2008 0412   CHOLHDL 3.5 10/10/2008 0412   VLDL 14 10/10/2008 0412   LDLCALC  10/10/2008 0412    72        Total Cholesterol/HDL:CHD Risk Coronary Heart Disease Risk Table                     Men   Women  1/2 Average Risk   3.4   3.3  Average Risk       5.0   4.4  2 X Average Risk   9.6   7.1  3 X Average Risk  23.4   11.0        Use the calculated Patient Ratio above and the CHD Risk Table to determine the patient's CHD Risk.        ATP III CLASSIFICATION (LDL):  <100     mg/dL   Optimal  100-129  mg/dL   Near or Above                    Optimal  130-159  mg/dL   Borderline  160-189  mg/dL   High  >190     mg/dL   Very High  Wt Readings from Last 3 Encounters:  09/07/17 281 lb 6.4 oz (127.6 kg)  01/13/17 290 lb  (131.5 kg)  12/11/16 292 lb 1.6 oz (132.5 kg)    Other studies Reviewed:  Echocardiogram 12/11/2016.  Left ventricle: The cavity size was normal. There was mild   concentric hypertrophy. Systolic function was normal. The   estimated ejection fraction was in the range of 55% to 60%. Wall   motion was normal; there were no regional wall motion   abnormalities. Features are consistent with a pseudonormal left   ventricular filling pattern, with concomitant abnormal relaxation   and increased filling pressure (grade 2 diastolic dysfunction). - Left atrium: The atrium was moderately dilated. - Atrial septum: There was an atrial septal aneurysm. - Pulmonary arteries: PA peak pressure: 33 mm Hg (S).  ASSESSMENT AND PLAN:  1. CAD: Hx of CABG in 2005, worsening symptoms of DOE, fatigue. Will plan Lexiscan Myoview for re-evaluation and progression of CAD or new areas of ischemia.  2. Hypertension: BP is elevated today. Will increase amlodipine to 5 mg daily for better control. He will have repeat echocardiogram to evaluate for changes in LV function. Check BMET.   3. DOE: Multifactorial. Obesity, possible OSA, will follow up once other testing is completed.   4. Bilateral leg pain: Symptoms of claudication, especially on the right, with absent pulses an skin changes. Will have ABI's completed.   5. Hypercholesterolemia: On lovastatin. Has not had labs checked in over 6 months. Will check fasting lipids and LFT's.   Current medicines are reviewed at length with the patient today.    Labs/ tests ordered today include: Echocardiogram, Lexiscan stress test, BMET, CBC, TSH, Lipids and LFT's, bilateral ABI's.   Phill Myron. West Pugh, ANP, AACC   09/07/2017 4:53 PM    [facility] [address] [place], [place] [address] Phone: [phone]; Fax: [phone]

## 2017-09-07 ENCOUNTER — Encounter: Payer: Self-pay | Admitting: Adult Health

## 2017-09-07 ENCOUNTER — Ambulatory Visit: Payer: Medicare Other | Admitting: Adult Health

## 2017-09-07 VITALS — BP 161/98 | HR 51 | Ht 74.0 in | Wt 281.4 lb

## 2017-09-07 DIAGNOSIS — I2510 Atherosclerotic heart disease of native coronary artery without angina pectoris: Secondary | ICD-10-CM

## 2017-09-07 DIAGNOSIS — R0989 Other specified symptoms and signs involving the circulatory and respiratory systems: Secondary | ICD-10-CM

## 2017-09-07 DIAGNOSIS — I519 Heart disease, unspecified: Secondary | ICD-10-CM

## 2017-09-07 DIAGNOSIS — I10 Essential (primary) hypertension: Secondary | ICD-10-CM

## 2017-09-07 DIAGNOSIS — M7989 Other specified soft tissue disorders: Secondary | ICD-10-CM

## 2017-09-07 DIAGNOSIS — E7800 Pure hypercholesterolemia, unspecified: Secondary | ICD-10-CM

## 2017-09-07 DIAGNOSIS — R0609 Other forms of dyspnea: Secondary | ICD-10-CM

## 2017-09-07 MED ORDER — AMLODIPINE BESYLATE 5 MG PO TABS: ORAL_TABLET | ORAL | 3 refills | Status: DC

## 2017-09-07 NOTE — Patient Instructions (Signed)
Medication Instructions:  INCREASE AMLODIPINE 5MG  DAILY  If you need a refill on your cardiac medications before your next appointment, please call your pharmacy.  Labwork: A1C,BMET,CBC,FLP AND LFT TODAY  HERE IN OUR OFFICE AT [facility]  Take the provided lab slips with you to the lab for your blood draw.   Testing/Procedures: Echocardiogram - Your physician has requested that you have an echocardiogram. Echocardiography is a painless test that uses sound waves to create images of your heart. It provides your doctor with information about the size and shape of your heart and how well your heart's chambers and valves are working. This procedure takes approximately one hour. There are no restrictions for this procedure. This will be performed at our [facility] location - [address].  Your physician has requested that you have an ankle brachial index (ABI). During this test an ultrasound and blood pressure cuff are used to evaluate the arteries that supply the arms and legs with blood. Allow thirty minutes for this exam. There are no restrictions or special instructions.  Your physician has requested that you have a lexiscan myoview. A cardiac stress test is a cardiological test that measures the heart's ability to respond to external stress in a controlled clinical environment. The stress response is induced byintravenous pharmacological stimulation. For further information please visit [facility]ugeFiesta.tn. Please follow instructions below. How to prepare for your Myocardial Perfusion Test:  Hold: METOPROLOL AND LASIX the day of the testing  Do not eat or drink 3 hours prior to your test, except you may have water.  Do not consume products containing caffeine (regular or decaffeinated) 12 hours prior to your test. (ex: coffee, chocolate, sodas, tea).  Do wear comfortable clothes (no dresses or overalls) and walking shoes, tennis shoes preferred (No heels or open toe shoes are  allowed).  Do NOT wear cologne, perfume, aftershave, or lotions (deodorant is allowed).  If these instructions are not followed, your test will have to be rescheduled.  If you have questions or concerns about your appointment, you can call the Nuclear Lab at [phone].   Follow-Up: Your physician wants you to follow-up in: AFTER TESTING WITH DR BERRY -OR- KATHRYN LAWRENCE ([place]), DNP,AACC IF PRIMARY CARDIOLOGIST IS UNAVAILABLE.    Thank you for choosing [facility] at [facility]!!

## 2017-09-08 LAB — HEPATIC FUNCTION PANEL
ALK PHOS: 60 IU/L (ref 39–117)
ALT: 10 IU/L (ref 0–44)
AST: 19 IU/L (ref 0–40)
Albumin: 4.1 g/dL (ref 3.5–4.8)
BILIRUBIN, DIRECT: 0.14 mg/dL (ref 0.00–0.40)
Bilirubin Total: 0.5 mg/dL (ref 0.0–1.2)
TOTAL PROTEIN: 7.5 g/dL (ref 6.0–8.5)

## 2017-09-08 LAB — LIPID PANEL
CHOLESTEROL TOTAL: 186 mg/dL (ref 100–199)
Chol/HDL Ratio: 4.8 ratio (ref 0.0–5.0)
HDL: 39 mg/dL — AB (ref >39)
LDL Calculated: 126 mg/dL — ABNORMAL HIGH (ref 0–99)
Triglycerides: 105 mg/dL (ref 0–149)
VLDL Cholesterol Cal: 21 mg/dL (ref 5–40)

## 2017-09-08 LAB — CBC
HEMOGLOBIN: 13 g/dL (ref 13.0–17.7)
Hematocrit: 38.9 % (ref 37.5–51.0)
MCH: 27.5 pg (ref 26.6–33.0)
MCHC: 33.4 g/dL (ref 31.5–35.7)
MCV: 82 fL (ref 79–97)
PLATELETS: 220 10*3/uL (ref 150–450)
RBC: 4.73 x10E6/uL (ref 4.14–5.80)
RDW: 16.4 % — ABNORMAL HIGH (ref 12.3–15.4)
WBC: 6.7 10*3/uL (ref 3.4–10.8)

## 2017-09-08 LAB — BASIC METABOLIC PANEL
BUN / CREAT RATIO: 17 (ref 10–24)
BUN: 21 mg/dL (ref 8–27)
CO2: 27 mmol/L (ref 20–29)
CREATININE: 1.27 mg/dL (ref 0.76–1.27)
Calcium: 10 mg/dL (ref 8.6–10.2)
Chloride: 104 mmol/L (ref 96–106)
GFR calc non Af Amer: 55 mL/min/{1.73_m2} — ABNORMAL LOW (ref >59)
GFR, EST AFRICAN AMERICAN: 63 mL/min/{1.73_m2} (ref >59)
Glucose: 87 mg/dL (ref 65–99)
Potassium: 4.6 mmol/L (ref 3.5–5.2)
Sodium: 143 mmol/L (ref 134–144)

## 2017-09-08 LAB — HEMOGLOBIN A1C
Est. average glucose Bld gHb Est-mCnc: 120 mg/dL
Hgb A1c MFr Bld: 5.8 % — ABNORMAL HIGH (ref 4.8–5.6)

## 2017-09-08 LAB — TSH: TSH: 1.55 u[IU]/mL (ref 0.450–4.500)

## 2017-09-09 ENCOUNTER — Other Ambulatory Visit: Payer: Self-pay | Admitting: Adult Health

## 2017-09-09 ENCOUNTER — Telehealth: Payer: Self-pay | Admitting: Adult Health

## 2017-09-09 ENCOUNTER — Telehealth (HOSPITAL_COMMUNITY): Payer: Self-pay

## 2017-09-09 DIAGNOSIS — R0989 Other specified symptoms and signs involving the circulatory and respiratory systems: Secondary | ICD-10-CM

## 2017-09-09 DIAGNOSIS — M7989 Other specified soft tissue disorders: Secondary | ICD-10-CM

## 2017-09-09 NOTE — Telephone Encounter (Signed)
Encounter complete. 

## 2017-09-09 NOTE — Telephone Encounter (Signed)
New Message     Patient's wife called has questions regarding chol results. Pls call.

## 2017-09-10 ENCOUNTER — Telehealth (HOSPITAL_COMMUNITY): Payer: Self-pay | Admitting: Adult Health

## 2017-09-13 ENCOUNTER — Telehealth: Payer: Self-pay | Admitting: Cardiovascular Disease

## 2017-09-13 NOTE — Telephone Encounter (Signed)
New Message:     Pt's wife would like to have an appointment with Dr Gwenlyn Found to talk to him about the pt please. Can this be arranged or would Dr Gwenlyn Found just want to call her.

## 2017-09-13 NOTE — Telephone Encounter (Signed)
Notes recorded by Lendon Colonel, NP on 09/08/2017 at 7:59 AM EDT Available labs reviewed. He is no longer anemic. LDL elevated, should be < 70. Please send him low cholesterol diet.  [id number]

## 2017-09-13 NOTE — Telephone Encounter (Signed)
SPOKE TO MRS [name]   - QUESTION WAS CONCERNING  MRS [name] MOTHER - A  PATIENT OF DR Gwenlyn Found ) NOT HER HUSBAND Silvio Wehrman   CLOSED ENCOUNTER

## 2017-09-14 ENCOUNTER — Ambulatory Visit (HOSPITAL_BASED_OUTPATIENT_CLINIC_OR_DEPARTMENT_OTHER): Payer: Medicare Other

## 2017-09-14 ENCOUNTER — Other Ambulatory Visit: Payer: Self-pay

## 2017-09-14 ENCOUNTER — Ambulatory Visit (HOSPITAL_COMMUNITY)
Admission: RE | Admit: 2017-09-14 | Discharge: 2017-09-14 | Disposition: A | Discharge status: Home / Self Care | Payer: Medicare Other | Source: Ambulatory Visit | Attending: Cardiovascular Disease | Admitting: Cardiovascular Disease

## 2017-09-14 DIAGNOSIS — I519 Heart disease, unspecified: Secondary | ICD-10-CM | POA: Insufficient documentation

## 2017-09-14 DIAGNOSIS — I2510 Atherosclerotic heart disease of native coronary artery without angina pectoris: Secondary | ICD-10-CM

## 2017-09-14 DIAGNOSIS — I10 Essential (primary) hypertension: Secondary | ICD-10-CM

## 2017-09-14 MED ORDER — PERFLUTREN LIPID MICROSPHERE
1.0000 mL | INTRAVENOUS | Status: AC | PRN
  Administered 2017-09-14: 1 mL via INTRAVENOUS

## 2017-09-14 MED ORDER — REGADENOSON 0.4 MG/5ML IV SOLN
0.4000 mg | Freq: Once | INTRAVENOUS | Status: AC
  Administered 2017-09-14: 0.4 mg via INTRAVENOUS

## 2017-09-14 MED ORDER — TECHNETIUM TC 99M TETROFOSMIN IV KIT
31.0000 | PACK | Freq: Once | INTRAVENOUS | Status: AC | PRN
  Administered 2017-09-14: 31 via INTRAVENOUS
  Filled 2017-09-14: qty 31

## 2017-09-14 NOTE — Telephone Encounter (Signed)
User: [name] A Date/time: 09/10/17 10:31 AM  Comment: Called pt and lmsg for him to CB to confirm appts on 09/14/17  Context:  Outcome: Left Message  Phone number: [phone] Phone Type: Mobile  Comm. type: Telephone Call type: Outgoing  Contact: [name], Shane T Relation to patient: Self

## 2017-09-15 ENCOUNTER — Encounter (HOSPITAL_COMMUNITY): Payer: Self-pay

## 2017-09-15 ENCOUNTER — Ambulatory Visit (HOSPITAL_COMMUNITY)
Admission: RE | Admit: 2017-09-15 | Discharge: 2017-09-15 | Disposition: A | Discharge status: Home / Self Care | Payer: Medicare Other | Source: Ambulatory Visit | Attending: Cardiovascular Disease | Admitting: Cardiovascular Disease

## 2017-09-15 DIAGNOSIS — M7989 Other specified soft tissue disorders: Secondary | ICD-10-CM | POA: Insufficient documentation

## 2017-09-15 DIAGNOSIS — R0989 Other specified symptoms and signs involving the circulatory and respiratory systems: Secondary | ICD-10-CM | POA: Insufficient documentation

## 2017-09-15 LAB — MYOCARDIAL PERFUSION IMAGING
CHL CUP NUCLEAR SDS: 3
CSEPPHR: 95 {beats}/min
LV dias vol: 113 mL (ref 62–150)
LV sys vol: 54 mL
NUC STRESS TID: 0.79
Rest HR: 51 {beats}/min
SRS: 0
SSS: 3

## 2017-09-15 MED ORDER — TECHNETIUM TC 99M TETROFOSMIN IV KIT
31.0000 | PACK | Freq: Once | INTRAVENOUS | Status: AC | PRN
  Administered 2017-09-15: 31 via INTRAVENOUS

## 2017-09-16 ENCOUNTER — Encounter (HOSPITAL_COMMUNITY): Payer: Self-pay

## 2017-09-24 ENCOUNTER — Ambulatory Visit (HOSPITAL_COMMUNITY)
Admission: RE | Admit: 2017-09-24 | Payer: Medicare Other | Source: Ambulatory Visit | Attending: Adult Health | Admitting: Adult Health

## 2017-09-24 DIAGNOSIS — R0989 Other specified symptoms and signs involving the circulatory and respiratory systems: Secondary | ICD-10-CM

## 2017-10-13 ENCOUNTER — Other Ambulatory Visit: Payer: Self-pay | Admitting: Adult Health

## 2017-10-13 ENCOUNTER — Other Ambulatory Visit (HOSPITAL_COMMUNITY): Payer: Self-pay | Admitting: Internal Medicine

## 2017-10-13 DIAGNOSIS — R0989 Other specified symptoms and signs involving the circulatory and respiratory systems: Secondary | ICD-10-CM

## 2017-10-22 ENCOUNTER — Ambulatory Visit (HOSPITAL_COMMUNITY)
Admission: RE | Admit: 2017-10-22 | Discharge: 2017-10-22 | Disposition: A | Discharge status: Home / Self Care | Payer: Medicare Other | Source: Ambulatory Visit | Attending: Internal Medicine | Admitting: Internal Medicine

## 2017-10-22 DIAGNOSIS — R0989 Other specified symptoms and signs involving the circulatory and respiratory systems: Secondary | ICD-10-CM

## 2017-10-30 NOTE — Progress Notes (Signed)
Cardiology Office Note   Date:  11/02/2017   ID:  [name], [name] 1940-12-02, MRN [id number]  PCP:  Rogers Blocker, MD  Cardiologist: Dr. Debara Pickett Chief Complaint  Patient presents with  . Coronary Artery Disease  . Hypertension     History of Present Illness: [name] is a 77 y.o. male who presents for ongoing assessment and management of coronary artery disease, history of CABG in 2005 (LIMA to LAD, SVG to first OM, and second OM, SVG to RCA).  Hypertension, dyslipidemia, and gout.  He was seen in our office on 09/07/2017 as a new patient at the request of Dr. Marlou Sa.  At that time he had complaints of generalized fatigue, dyspnea on exertion, pain in the right leg and resistant hypertension.    During that office visit a Lexiscan Myoview for reevaluation of progression of CAD was ordered.  His blood pressure was not well controlled and therefore amlodipine was increased to 5 mg daily, a repeat echocardiogram was ordered.  Due to bilateral leg pain with symptoms of claudication especially on the right ABIs were ordered.  Stress test ordered on 09/15/2017 revealed low risk study with no significant ischemia identified.  Echocardiogram revealed normal LV systolic function 53% to 66%, grade 1 diastolic dysfunction.  Aortic valve was mildly thickened and mildly calcified sclerosis without stenosis.  There was no significant regurgitation.  ABIs on 10/22/2017 revealed normal indices.  He is here today with a head cold. Sneezing and having allergies. He has been compliant with his medications.  Past Medical History:  Diagnosis Date  . CAD (coronary artery disease)    s/p cath in 2005 and CABG x4  . Cataract   . DJD (degenerative joint disease)   . DJD (degenerative joint disease)   . Fluid retention    mild  . H/O: gout   . History of blood clots   . HTN (hypertension)   . Hyperlipidemia   . Hyperlipidemia   . Myocardial infarction ([place]) 2009  . Obesity    with reduction  . OSA  (obstructive sleep apnea)    AHI 98/hr  . Renal insufficiency   . Sleep apnea   . Stroke [facility]) 2000    Past Surgical History:  Procedure Laterality Date  . CORONARY ARTERY BYPASS GRAFT  2005   LIMA to LAD, SVG to OM1 and OM 2, RCA.   [name] left inguinal hernia repair    . TEE WITHOUT CARDIOVERSION N/A 12/11/2016   Procedure: TRANSESOPHAGEAL ECHOCARDIOGRAM (TEE);  Surgeon: [name], MD;  Location: [facility] ENDOSCOPY;  Service: Cardiovascular;  Laterality: N/A;     Current Outpatient Medications  Medication Sig Dispense Refill  . allopurinol (ZYLOPRIM) 100 MG tablet Take 200 mg by mouth daily.     [name] amLODipine (NORVASC) 5 MG tablet TAKE ONE TABLET BY MOUTH ONCE DAILY. 30 tablet 3  . aspirin 81 MG tablet Take 81 mg by mouth daily.      . furosemide (LASIX) 20 MG tablet Take 20 mg by mouth 2 (two) times daily.      [name] lovastatin (MEVACOR) 20 MG tablet TAKE ONE TABLET BY MOUTH TWICE DAILY. MUST HAVE OFFICE VISIT BEFORE FURTHER REFILL. (Patient taking differently: TAKE ONE 20 mg TABLET BY MOUTH TWICE DAILY. MUST HAVE OFFICE VISIT BEFORE FURTHER REFILL.) 60 tablet 6  . metoprolol tartrate (LOPRESSOR) 25 MG tablet TAKE ONE TABLET BY MOUTH TWICE DAILY 30 tablet 0  . montelukast (SINGULAIR) 10 MG tablet Take 10 mg  by mouth at bedtime.    . niacin (NIASPAN) 500 MG CR tablet Take 500 mg by mouth at bedtime.      [name] olmesartan (BENICAR) 40 MG tablet Take 40 mg by mouth daily.      . phosphorus (K PHOS NEUTRAL) 155-852-130 MG tablet Take 2 tablets (500 mg total) by mouth 3 (three) times daily. 3 tablet 0  . polyethylene glycol powder (GLYCOLAX/MIRALAX) powder Take 17 g by mouth daily as needed for constipation.    . Vitamin D, Cholecalciferol, 1000 units CAPS Take by mouth.     No current facility-administered medications for this visit.     Allergies:   Ramipril    Social History:  The patient  reports that he has quit smoking. His smoking use included cigars. He has never used smokeless  tobacco. He reports that he does not drink alcohol or use drugs.   Family History:  The patient's family history includes Arthritis in his brother, brother, father, and mother; Coronary artery disease in his brother, brother, father, mother, and unknown relative; Gout in his brother, brother, father, and mother; Hypertension in his brother, brother, father, and mother; Stroke in his brother, brother, father, and mother.    ROS: All other systems are reviewed and negative. Unless otherwise mentioned in H&P    PHYSICAL EXAM: VS:  BP (!) 156/98 (BP Location: Right Arm, Patient Position: Sitting, Cuff Size: Large)   Pulse (!) 58   Ht 6\' 2"  (1.88 m)   Wt 281 lb 9.6 oz (127.7 kg)   BMI 36.16 kg/m  , BMI Body mass index is 36.16 kg/m. GEN: Well nourished, well developed, in no acute distress  HEENT: normal Watery eyes, sniffing and blowing his nose.  Neck: no JVD, carotid bruits, or masses Cardiac: RRR distant heart sounds; no murmurs, rubs, or gallops,no edema  Respiratory:  clear to auscultation bilaterally, normal work of breathing GI: soft, nontender, nondistended, + BS MS: no deformity or atrophy Central obesity.  Skin: warm and dry, no rash Neuro:  Strength and sensation are intact Psych: euthymic mood, full affect   EKG:  Not completed today,   Recent Labs: 12/09/2016: B Natriuretic Peptide 59.6 12/12/2016: Magnesium 1.8 09/07/2017: ALT 10; BUN 21; Creatinine, Ser 1.27; Hemoglobin 13.0; Platelets 220; Potassium 4.6; Sodium 143; TSH 1.550    Lipid Panel    Component Value Date/Time   CHOL 186 09/07/2017 1617   TRIG 105 09/07/2017 1617   HDL 39 (L) 09/07/2017 1617   CHOLHDL 4.8 09/07/2017 1617   CHOLHDL 3.5 10/10/2008 0412   VLDL 14 10/10/2008 0412   LDLCALC 126 (H) 09/07/2017 1617      Wt Readings from Last 3 Encounters:  11/02/17 281 lb 9.6 oz (127.7 kg)  09/07/17 281 lb 6.4 oz (127.6 kg)  01/13/17 290 lb (131.5 kg)      Other studies Reviewed: Stress Test  09/15/2017  Study Highlights     Nuclear stress EF: 52%, no significant wall motion abnormalities.  The left ventricular ejection fraction is mildly decreased (45-54%).  There was no ST segment deviation noted during stress.  Defect 1: There is a small defect of mild severity present in the basal inferior location likely represents diaphragmatic attenuation.  This is a low risk study with no significant ischemia identified. Post CABG.     Echocardiogram 09/14/2017 Left ventricle: The cavity size was normal. There was moderate   concentric hypertrophy. Systolic function was normal. The   estimated ejection fraction was in the  range of 60% to 65%. Wall   motion was normal; there were no regional wall motion   abnormalities. Doppler parameters are consistent with abnormal   left ventricular relaxation (grade 1 diastolic dysfunction). - Left atrium: The atrium was mildly dilated.   ASSESSMENT AND PLAN:  1.  CAD: hx of CABG in 2005,. No complaints of chest pain. Continue secondary prevention. He will need to have lipids checked on next visit. Continue lovastatin. Myoview was negative for ischemia.   2. Hypertension: BP is elevated today. I rechecked it myself. Down to 146/84 Will continue amlodipine at 5 mg. Hopefully will be lower on follow up visits. He is advised on low sodium diet. He may need OSA study due to body habitus if BP remains difficult to control. Will not order this now as he is suffering from a cold.   3. Leg Pain: ABI;s are normal and negative for circulation abnormalities.   4. Obesity:  Will need to increase activity and reduce caloric intake. Will need to be considered for OSA evaluation due to body habitus on next visit.    Current medicines are reviewed at length with the patient today.    Labs/ tests ordered today include: None  Phill Myron. [place] Pugh, ANP, AACC   11/02/2017 1:39 PM    [facility] [address], [place], [place]  [address] Phone: [phone]; Fax: [phone]

## 2017-11-02 ENCOUNTER — Encounter: Payer: Self-pay | Admitting: Adult Health

## 2017-11-02 ENCOUNTER — Ambulatory Visit: Payer: Medicare Other | Admitting: Adult Health

## 2017-11-02 VITALS — BP 156/98 | HR 58 | Ht 74.0 in | Wt 281.6 lb

## 2017-11-02 DIAGNOSIS — I2510 Atherosclerotic heart disease of native coronary artery without angina pectoris: Secondary | ICD-10-CM

## 2017-11-02 DIAGNOSIS — E7800 Pure hypercholesterolemia, unspecified: Secondary | ICD-10-CM | POA: Diagnosis not present

## 2017-11-02 DIAGNOSIS — I10 Essential (primary) hypertension: Secondary | ICD-10-CM | POA: Diagnosis not present

## 2017-11-02 DIAGNOSIS — R0609 Other forms of dyspnea: Secondary | ICD-10-CM | POA: Diagnosis not present

## 2017-11-02 NOTE — Patient Instructions (Signed)
Medication Instructions:  NO CHANGES- Your physician recommends that you continue on your current medications as directed. Please refer to the Current Medication list given to you today.  If you need a refill on your cardiac medications before your next appointment, please call your pharmacy.  Follow-Up: Your physician wants you to follow-up in: [place] should receive a reminder letter in the mail two months in advance. If you do not receive a letter, please call our office in JAN 2020 to schedule your MARCH follow-up appointment.   Thank you for choosing [facility] at [facility]!!

## 2017-11-07 ENCOUNTER — Emergency Department (HOSPITAL_BASED_OUTPATIENT_CLINIC_OR_DEPARTMENT_OTHER)
Admission: EM | Admit: 2017-11-07 | Discharge: 2017-11-07 | Disposition: A | Discharge status: Home / Self Care | Payer: Medicare Other | Attending: Emergency Medicine | Admitting: Emergency Medicine

## 2017-11-07 ENCOUNTER — Encounter (HOSPITAL_BASED_OUTPATIENT_CLINIC_OR_DEPARTMENT_OTHER): Payer: Self-pay | Admitting: Adult Health

## 2017-11-07 ENCOUNTER — Emergency Department (HOSPITAL_BASED_OUTPATIENT_CLINIC_OR_DEPARTMENT_OTHER): Payer: Medicare Other

## 2017-11-07 ENCOUNTER — Other Ambulatory Visit: Payer: Self-pay

## 2017-11-07 DIAGNOSIS — Z87891 Personal history of nicotine dependence: Secondary | ICD-10-CM | POA: Insufficient documentation

## 2017-11-07 DIAGNOSIS — Z951 Presence of aortocoronary bypass graft: Secondary | ICD-10-CM | POA: Insufficient documentation

## 2017-11-07 DIAGNOSIS — I10 Essential (primary) hypertension: Secondary | ICD-10-CM | POA: Diagnosis not present

## 2017-11-07 DIAGNOSIS — R509 Fever, unspecified: Secondary | ICD-10-CM

## 2017-11-07 DIAGNOSIS — Z8673 Personal history of transient ischemic attack (TIA), and cerebral infarction without residual deficits: Secondary | ICD-10-CM | POA: Insufficient documentation

## 2017-11-07 DIAGNOSIS — I2510 Atherosclerotic heart disease of native coronary artery without angina pectoris: Secondary | ICD-10-CM | POA: Diagnosis not present

## 2017-11-07 DIAGNOSIS — I252 Old myocardial infarction: Secondary | ICD-10-CM | POA: Diagnosis not present

## 2017-11-07 DIAGNOSIS — N390 Urinary tract infection, site not specified: Secondary | ICD-10-CM | POA: Insufficient documentation

## 2017-11-07 DIAGNOSIS — R6883 Chills (without fever): Secondary | ICD-10-CM | POA: Diagnosis present

## 2017-11-07 LAB — URINALYSIS, ROUTINE W REFLEX MICROSCOPIC
Bilirubin Urine: NEGATIVE
GLUCOSE, UA: NEGATIVE mg/dL
Ketones, ur: NEGATIVE mg/dL
Nitrite: NEGATIVE
Protein, ur: 100 mg/dL — AB
SPECIFIC GRAVITY, URINE: 1.015 (ref 1.005–1.030)
pH: 5.5 (ref 5.0–8.0)

## 2017-11-07 LAB — COMPREHENSIVE METABOLIC PANEL
ALK PHOS: 54 U/L (ref 38–126)
ALT: 13 U/L (ref 0–44)
AST: 25 U/L (ref 15–41)
Albumin: 3.9 g/dL (ref 3.5–5.0)
Anion gap: 10 (ref 5–15)
BUN: 29 mg/dL — AB (ref 8–23)
CALCIUM: 9.6 mg/dL (ref 8.9–10.3)
CHLORIDE: 104 mmol/L (ref 98–111)
CO2: 25 mmol/L (ref 22–32)
CREATININE: 1.48 mg/dL — AB (ref 0.61–1.24)
GFR calc Af Amer: 52 mL/min — ABNORMAL LOW (ref >60)
GFR, EST NON AFRICAN AMERICAN: 45 mL/min — AB (ref >60)
Glucose, Bld: 107 mg/dL — ABNORMAL HIGH (ref 70–99)
Potassium: 4.4 mmol/L (ref 3.5–5.1)
SODIUM: 139 mmol/L (ref 135–145)
Total Bilirubin: 0.6 mg/dL (ref 0.3–1.2)
Total Protein: 8.3 g/dL — ABNORMAL HIGH (ref 6.5–8.1)

## 2017-11-07 LAB — CBC
HCT: 40 % (ref 39.0–52.0)
HEMOGLOBIN: 13.3 g/dL (ref 13.0–17.0)
MCH: 27.8 pg (ref 26.0–34.0)
MCHC: 33.3 g/dL (ref 30.0–36.0)
MCV: 83.5 fL (ref 78.0–100.0)
PLATELETS: 216 10*3/uL (ref 150–400)
RBC: 4.79 MIL/uL (ref 4.22–5.81)
RDW: 15 % (ref 11.5–15.5)
WBC: 12.5 10*3/uL — AB (ref 4.0–10.5)

## 2017-11-07 LAB — URINALYSIS, MICROSCOPIC (REFLEX)

## 2017-11-07 MED ORDER — ACETAMINOPHEN 500 MG PO TABS
1000.0000 mg | ORAL_TABLET | Freq: Once | ORAL | Status: AC
  Administered 2017-11-07: 1000 mg via ORAL
  Filled 2017-11-07: qty 2

## 2017-11-07 MED ORDER — CEPHALEXIN 250 MG PO CAPS
500.0000 mg | ORAL_CAPSULE | Freq: Once | ORAL | Status: AC
  Administered 2017-11-07: 500 mg via ORAL
  Filled 2017-11-07: qty 2

## 2017-11-07 MED ORDER — CEPHALEXIN 500 MG PO CAPS: 500.0000 mg | ORAL_CAPSULE | Freq: Two times a day (BID) | ORAL | 0 refills | Status: AC

## 2017-11-07 NOTE — ED Notes (Signed)
Pt given coke to drink 

## 2017-11-07 NOTE — ED Notes (Signed)
Per Dr. Sedonia Small, only 1 set of blood cultures needed and no lactic acid needed.

## 2017-11-07 NOTE — ED Notes (Signed)
ED Provider at bedside. 

## 2017-11-07 NOTE — ED Provider Notes (Addendum)
[facility] Emergency Department Provider Note MRN:  [id number]  Arrival date & time: 11/07/17     Chief Complaint   Fever   History of Present Illness   [name] is a 77 y.o. year-old male with a history of CAD status post CABG, hypertension, hyperlipidemia, gout presenting to the ED with chief complaint of chills.  Patient has been experiencing cold-like symptoms for 3 to 4 days.  Runny nose, cough that was dry initially but becoming more productive.  This morning, while on the way to church, patient experienced sudden onset shaking chills.  Patient had experienced this once before, and was admitted to the hospital for bacteremia.  The symptom was concerning, prompting visit to the emergency department.  Patient denies diaphoresis, no nausea, no vomiting, no chest pain, no shortness of breath, no abdominal pain, no diarrhea, no dysuria.  No rashes.  No tick bites.  Review of Systems  A complete 10 system review of systems was obtained and all systems are negative except as noted in the HPI and PMH.   Patient's Health History    Past Medical History:  Diagnosis Date  . CAD (coronary artery disease)    s/p cath in 2005 and CABG x4  . Cataract   . DJD (degenerative joint disease)   . DJD (degenerative joint disease)   . Fluid retention    mild  . H/O: gout   . History of blood clots   . HTN (hypertension)   . Hyperlipidemia   . Hyperlipidemia   . Myocardial infarction ([place]) 2009  . Obesity    with reduction  . OSA (obstructive sleep apnea)    AHI 98/hr  . Renal insufficiency   . Sleep apnea   . Stroke [facility]) 2000    Past Surgical History:  Procedure Laterality Date  . CORONARY ARTERY BYPASS GRAFT  2005   LIMA to LAD, SVG to OM1 and OM 2, RCA.   [name] left inguinal hernia repair    . TEE WITHOUT CARDIOVERSION N/A 12/11/2016   Procedure: TRANSESOPHAGEAL ECHOCARDIOGRAM (TEE);  Surgeon: [name], MD;  Location: [facility] ENDOSCOPY;  Service:  Cardiovascular;  Laterality: N/A;    Family History  Problem Relation Age of Onset  . Gout Mother   . Stroke Mother   . Coronary artery disease Mother   . Hypertension Mother   . Arthritis Mother   . Coronary artery disease Father   . Arthritis Father   . Gout Father   . Stroke Father   . Hypertension Father   . Coronary artery disease Brother   . Arthritis Brother   . Gout Brother   . Stroke Brother   . Hypertension Brother   . Coronary artery disease Brother   . Arthritis Brother   . Gout Brother   . Stroke Brother   . Hypertension Brother   . Coronary artery disease Unknown        significant in multiple family members    Social History   Socioeconomic History  . Marital status: Married    Spouse name: [name]  . Number of children: Not on file  . Years of education: Not on file  . Highest education level: Not on file  Occupational History  . Not on file  Social Needs  . Financial resource strain: Not on file  . Food insecurity:    Worry: Not on file    Inability: Not on file  . Transportation needs:  Medical: Not on file    Non-medical: Not on file  Tobacco Use  . Smoking status: Former Smoker    Types: Cigars  . Smokeless tobacco: Never Used  . Tobacco comment: used to smoke cigars, 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, none since  Substance and Sexual Activity  . Alcohol use: No  . Drug use: No  . Sexual activity: Not on file  Lifestyle  . Physical activity:    Days per week: Not on file    Minutes per session: Not on file  . Stress: Not on file  Relationships  . Social connections:    Talks on phone: Not on file    Gets together: Not on file    Attends religious service: Not on file    Active member of club or organization: Not on file    Attends meetings of clubs or organizations: Not on file    Relationship status: Not on file  . Intimate partner violence:    Fear of current or ex partner: Not on file    Emotionally abused: Not on file     Physically abused: Not on file    Forced sexual activity: Not on file  Other Topics Concern  . Not on file  Social History Narrative  . Not on file     Physical Exam  Vital Signs and Nursing Notes reviewed Vitals:   11/07/17 1230 11/07/17 1414  BP: (!) 141/91   Pulse: 98   Resp: (!) 29   Temp:  (!) 100.4 F (38 C)  SpO2: 95%     CONSTITUTIONAL: Well-appearing, NAD NEURO:  Alert and oriented x 3, no focal deficits EYES:  eyes equal and reactive ENT/NECK:  no LAD, no JVD CARDIO: Regular rate, well-perfused, normal S1 and S2 PULM:  CTAB no wheezing or rhonchi GI/GU:  normal bowel sounds, non-distended, non-tender MSK/SPINE:  No gross deformities, no edema SKIN:  no rash, atraumatic PSYCH:  Appropriate speech and behavior  Diagnostic and Interventional Summary    EKG Interpretation  Date/Time:    Ventricular Rate:    PR Interval:    QRS Duration:   QT Interval:    QTC Calculation:   R Axis:     Text Interpretation:        Labs Reviewed  CBC - Abnormal; Notable for the following components:      Result Value   WBC 12.5 (*)    All other components within normal limits  COMPREHENSIVE METABOLIC PANEL - Abnormal; Notable for the following components:   Glucose, Bld 107 (*)    BUN 29 (*)    Creatinine, Ser 1.48 (*)    Total Protein 8.3 (*)    GFR calc non Af Amer 45 (*)    GFR calc Af Amer 52 (*)    All other components within normal limits  URINALYSIS, ROUTINE W REFLEX MICROSCOPIC - Abnormal; Notable for the following components:   Hgb urine dipstick SMALL (*)    Protein, ur 100 (*)    Leukocytes, UA LARGE (*)    All other components within normal limits  URINALYSIS, MICROSCOPIC (REFLEX) - Abnormal; Notable for the following components:   Bacteria, UA FEW (*)    All other components within normal limits  CULTURE, BLOOD (SINGLE)    DG Chest 2 View  Final Result      Medications  acetaminophen (TYLENOL) tablet 1,000 mg (1,000 mg Oral Given 11/07/17 1239)      Procedures Critical Care  ED  Course and Medical Decision Making  I have reviewed the triage vital signs and the nursing notes.  Pertinent labs & imaging results that were available during my care of the patient were reviewed by me and considered in my medical decision making (see below for details).    Recent cold-like symptoms for the past few days, this morning with symptoms concerning for rigors in this 77 year old male, history of CAD, prior admission for bacteremia in the past.  Symptoms have since resolved, patient back to baseline currently.  Will undergo infectious work-up, labs, reassess.  Patient does have close PCP follow-up.  Labs reveal very mild leukocytosis, mild AKI.  Patient with no recurrence of rigors here in the ED.  Intermittent fever here but largely asymptomatic, feeling well.  Chest x-ray unremarkable, UA with some evidence to suggest infection.  In the setting of fever, will treat.  Patient prefers discharge with close PCP follow-up.  The patient's history of bacteremia was a concerning feature and this was explained to the patient.  Blood culture sent.  Still patient prefers not to be admitted.  Prescription for Keflex, strict return precautions given.  After the discussed management above, the patient was determined to be safe for discharge.  The patient was in agreement with this plan and all questions regarding their care were answered.  ED return precautions were discussed and the patient will return to the ED with any significant worsening of condition.  Barth Kirks. Sedonia Small, [place] [email or web address]  Final Clinical Impressions(s) / ED Diagnoses     ICD-10-CM   1. Urinary tract infection without hematuria, site unspecified N39.0   2. Fever R50.9 DG Chest 2 View    DG Chest 2 View    ED Discharge Orders         Ordered    cephALEXin (KEFLEX) 500 MG capsule  2 times daily     11/07/17 1429              Maudie Flakes, MD 11/07/17 1430    Maudie Flakes, MD 11/07/17 1434

## 2017-11-07 NOTE — ED Notes (Signed)
Patient transported to X-ray 

## 2017-11-07 NOTE — ED Notes (Signed)
Pt states that he felt "dizzy and shaky" when he woke up at 0800. He states the feeling passed and then he dressed for church. He states he started feeling chilled at 0830 so he went to car. Pt states he has not had anything to eat today.

## 2017-11-07 NOTE — ED Triage Notes (Signed)
Pt presents with a fever of 100.5, chills and increased fatigue. HE states he had a cold last week. He denies pain, urinary symtpoms or dyspnea.

## 2017-11-07 NOTE — Discharge Instructions (Addendum)
You were evaluated in the Emergency Department and after careful evaluation, we did not find any emergent condition requiring admission or further testing in the hospital.  Your symptoms today seem to be due to a urinary tract infection.  Please take the antibiotics as directed and follow-up closely with your PCP within the next 3 to 5 days.  Please return to the Emergency Department if you experience any worsening or recurrence of your symptoms.  We encourage you to follow up with a primary care provider.  Thank you for allowing [place] to be a part of your care.

## 2017-11-12 LAB — CULTURE, BLOOD (SINGLE)
CULTURE: NO GROWTH
SPECIAL REQUESTS: ADEQUATE

## 2018-01-01 ENCOUNTER — Inpatient Hospital Stay (HOSPITAL_COMMUNITY): Payer: Medicare Other

## 2018-01-01 ENCOUNTER — Emergency Department (HOSPITAL_COMMUNITY): Payer: Medicare Other

## 2018-01-01 ENCOUNTER — Other Ambulatory Visit: Payer: Self-pay

## 2018-01-01 ENCOUNTER — Encounter (HOSPITAL_COMMUNITY): Payer: Self-pay

## 2018-01-01 ENCOUNTER — Inpatient Hospital Stay (HOSPITAL_COMMUNITY)
Admission: EM | Admit: 2018-01-01 | Discharge: 2018-01-05 | DRG: 041 | Disposition: A | Discharge status: Rehabilitation Facility | Payer: Medicare Other | Attending: Internal Medicine | Admitting: Internal Medicine

## 2018-01-01 DIAGNOSIS — G4733 Obstructive sleep apnea (adult) (pediatric): Secondary | ICD-10-CM | POA: Diagnosis present

## 2018-01-01 DIAGNOSIS — I160 Hypertensive urgency: Secondary | ICD-10-CM | POA: Diagnosis present

## 2018-01-01 DIAGNOSIS — N184 Chronic kidney disease, stage 4 (severe): Secondary | ICD-10-CM | POA: Diagnosis present

## 2018-01-01 DIAGNOSIS — I700 Atherosclerosis of aorta: Secondary | ICD-10-CM | POA: Diagnosis present

## 2018-01-01 DIAGNOSIS — I252 Old myocardial infarction: Secondary | ICD-10-CM | POA: Diagnosis not present

## 2018-01-01 DIAGNOSIS — Z8739 Personal history of other diseases of the musculoskeletal system and connective tissue: Secondary | ICD-10-CM | POA: Diagnosis not present

## 2018-01-01 DIAGNOSIS — M109 Gout, unspecified: Secondary | ICD-10-CM | POA: Diagnosis present

## 2018-01-01 DIAGNOSIS — I2510 Atherosclerotic heart disease of native coronary artery without angina pectoris: Secondary | ICD-10-CM | POA: Diagnosis present

## 2018-01-01 DIAGNOSIS — N182 Chronic kidney disease, stage 2 (mild): Secondary | ICD-10-CM | POA: Diagnosis not present

## 2018-01-01 DIAGNOSIS — Z86718 Personal history of other venous thrombosis and embolism: Secondary | ICD-10-CM | POA: Diagnosis not present

## 2018-01-01 DIAGNOSIS — I639 Cerebral infarction, unspecified: Secondary | ICD-10-CM | POA: Diagnosis not present

## 2018-01-01 DIAGNOSIS — Z8249 Family history of ischemic heart disease and other diseases of the circulatory system: Secondary | ICD-10-CM | POA: Diagnosis not present

## 2018-01-01 DIAGNOSIS — I69322 Dysarthria following cerebral infarction: Secondary | ICD-10-CM | POA: Diagnosis not present

## 2018-01-01 DIAGNOSIS — I169 Hypertensive crisis, unspecified: Secondary | ICD-10-CM

## 2018-01-01 DIAGNOSIS — Z823 Family history of stroke: Secondary | ICD-10-CM | POA: Diagnosis not present

## 2018-01-01 DIAGNOSIS — I672 Cerebral atherosclerosis: Secondary | ICD-10-CM | POA: Diagnosis present

## 2018-01-01 DIAGNOSIS — H269 Unspecified cataract: Secondary | ICD-10-CM | POA: Diagnosis present

## 2018-01-01 DIAGNOSIS — Z8673 Personal history of transient ischemic attack (TIA), and cerebral infarction without residual deficits: Secondary | ICD-10-CM

## 2018-01-01 DIAGNOSIS — N179 Acute kidney failure, unspecified: Secondary | ICD-10-CM | POA: Diagnosis present

## 2018-01-01 DIAGNOSIS — Z87891 Personal history of nicotine dependence: Secondary | ICD-10-CM | POA: Diagnosis not present

## 2018-01-01 DIAGNOSIS — Z6836 Body mass index (BMI) 36.0-36.9, adult: Secondary | ICD-10-CM

## 2018-01-01 DIAGNOSIS — M1990 Unspecified osteoarthritis, unspecified site: Secondary | ICD-10-CM | POA: Diagnosis not present

## 2018-01-01 DIAGNOSIS — D62 Acute posthemorrhagic anemia: Secondary | ICD-10-CM | POA: Diagnosis not present

## 2018-01-01 DIAGNOSIS — E785 Hyperlipidemia, unspecified: Secondary | ICD-10-CM | POA: Diagnosis present

## 2018-01-01 DIAGNOSIS — Z7982 Long term (current) use of aspirin: Secondary | ICD-10-CM

## 2018-01-01 DIAGNOSIS — D631 Anemia in chronic kidney disease: Secondary | ICD-10-CM | POA: Diagnosis present

## 2018-01-01 DIAGNOSIS — I63411 Cerebral infarction due to embolism of right middle cerebral artery: Principal | ICD-10-CM

## 2018-01-01 DIAGNOSIS — R29810 Facial weakness: Secondary | ICD-10-CM | POA: Diagnosis present

## 2018-01-01 DIAGNOSIS — Z8261 Family history of arthritis: Secondary | ICD-10-CM | POA: Diagnosis not present

## 2018-01-01 DIAGNOSIS — N183 Chronic kidney disease, stage 3 unspecified: Secondary | ICD-10-CM

## 2018-01-01 DIAGNOSIS — R609 Edema, unspecified: Secondary | ICD-10-CM | POA: Diagnosis not present

## 2018-01-01 DIAGNOSIS — G8114 Spastic hemiplegia affecting left nondominant side: Secondary | ICD-10-CM | POA: Diagnosis not present

## 2018-01-01 DIAGNOSIS — R29712 NIHSS score 12: Secondary | ICD-10-CM | POA: Diagnosis present

## 2018-01-01 DIAGNOSIS — R531 Weakness: Secondary | ICD-10-CM

## 2018-01-01 DIAGNOSIS — I361 Nonrheumatic tricuspid (valve) insufficiency: Secondary | ICD-10-CM | POA: Diagnosis not present

## 2018-01-01 DIAGNOSIS — G460 Middle cerebral artery syndrome: Secondary | ICD-10-CM | POA: Diagnosis not present

## 2018-01-01 DIAGNOSIS — I10 Essential (primary) hypertension: Secondary | ICD-10-CM | POA: Diagnosis not present

## 2018-01-01 DIAGNOSIS — I25709 Atherosclerosis of coronary artery bypass graft(s), unspecified, with unspecified angina pectoris: Secondary | ICD-10-CM | POA: Diagnosis not present

## 2018-01-01 DIAGNOSIS — D638 Anemia in other chronic diseases classified elsewhere: Secondary | ICD-10-CM | POA: Diagnosis not present

## 2018-01-01 DIAGNOSIS — I63511 Cerebral infarction due to unspecified occlusion or stenosis of right middle cerebral artery: Secondary | ICD-10-CM | POA: Diagnosis not present

## 2018-01-01 DIAGNOSIS — G8194 Hemiplegia, unspecified affecting left nondominant side: Secondary | ICD-10-CM | POA: Diagnosis present

## 2018-01-01 DIAGNOSIS — I5030 Unspecified diastolic (congestive) heart failure: Secondary | ICD-10-CM | POA: Diagnosis not present

## 2018-01-01 DIAGNOSIS — I69391 Dysphagia following cerebral infarction: Secondary | ICD-10-CM | POA: Diagnosis not present

## 2018-01-01 DIAGNOSIS — Z951 Presence of aortocoronary bypass graft: Secondary | ICD-10-CM

## 2018-01-01 DIAGNOSIS — R000 Tachycardia, unspecified: Secondary | ICD-10-CM

## 2018-01-01 DIAGNOSIS — Z789 Other specified health status: Secondary | ICD-10-CM

## 2018-01-01 DIAGNOSIS — Z888 Allergy status to other drugs, medicaments and biological substances status: Secondary | ICD-10-CM | POA: Diagnosis not present

## 2018-01-01 DIAGNOSIS — K219 Gastro-esophageal reflux disease without esophagitis: Secondary | ICD-10-CM | POA: Diagnosis present

## 2018-01-01 DIAGNOSIS — Z91199 Patient's noncompliance with other medical treatment and regimen due to unspecified reason: Secondary | ICD-10-CM

## 2018-01-01 DIAGNOSIS — Z9119 Patient's noncompliance with other medical treatment and regimen: Secondary | ICD-10-CM

## 2018-01-01 DIAGNOSIS — I6389 Other cerebral infarction: Secondary | ICD-10-CM | POA: Diagnosis not present

## 2018-01-01 DIAGNOSIS — Z9114 Patient's other noncompliance with medication regimen: Secondary | ICD-10-CM | POA: Diagnosis not present

## 2018-01-01 DIAGNOSIS — I63512 Cerebral infarction due to unspecified occlusion or stenosis of left middle cerebral artery: Secondary | ICD-10-CM | POA: Diagnosis not present

## 2018-01-01 DIAGNOSIS — R471 Dysarthria and anarthria: Secondary | ICD-10-CM | POA: Diagnosis present

## 2018-01-01 DIAGNOSIS — I129 Hypertensive chronic kidney disease with stage 1 through stage 4 chronic kidney disease, or unspecified chronic kidney disease: Secondary | ICD-10-CM | POA: Diagnosis present

## 2018-01-01 DIAGNOSIS — E669 Obesity, unspecified: Secondary | ICD-10-CM | POA: Diagnosis present

## 2018-01-01 LAB — LIPID PANEL
CHOL/HDL RATIO: 3.3 ratio
CHOLESTEROL: 127 mg/dL (ref 0–200)
HDL: 39 mg/dL — AB (ref >40)
LDL Cholesterol: 73 mg/dL (ref 0–99)
TRIGLYCERIDES: 73 mg/dL (ref <150)
VLDL: 15 mg/dL (ref 0–40)

## 2018-01-01 LAB — DIFFERENTIAL
ABS IMMATURE GRANULOCYTES: 0.02 10*3/uL (ref 0.00–0.07)
BASOS ABS: 0 10*3/uL (ref 0.0–0.1)
BASOS PCT: 0 %
EOS ABS: 0.3 10*3/uL (ref 0.0–0.5)
Eosinophils Relative: 4 %
IMMATURE GRANULOCYTES: 0 %
Lymphocytes Relative: 39 %
Lymphs Abs: 3.2 10*3/uL (ref 0.7–4.0)
Monocytes Absolute: 0.9 10*3/uL (ref 0.1–1.0)
Monocytes Relative: 11 %
Neutro Abs: 3.7 10*3/uL (ref 1.7–7.7)
Neutrophils Relative %: 46 %

## 2018-01-01 LAB — HEMOGLOBIN A1C
HEMOGLOBIN A1C: 5.8 % — AB (ref 4.8–5.6)
MEAN PLASMA GLUCOSE: 119.76 mg/dL

## 2018-01-01 LAB — RAPID URINE DRUG SCREEN, HOSP PERFORMED
Amphetamines: NOT DETECTED
BARBITURATES: NOT DETECTED
BENZODIAZEPINES: NOT DETECTED
COCAINE: NOT DETECTED
Opiates: NOT DETECTED
TETRAHYDROCANNABINOL: NOT DETECTED

## 2018-01-01 LAB — URINALYSIS, ROUTINE W REFLEX MICROSCOPIC
BACTERIA UA: NONE SEEN
Bilirubin Urine: NEGATIVE
Glucose, UA: NEGATIVE mg/dL
Hgb urine dipstick: NEGATIVE
KETONES UR: NEGATIVE mg/dL
Leukocytes, UA: NEGATIVE
Nitrite: NEGATIVE
PH: 6 (ref 5.0–8.0)
Protein, ur: 100 mg/dL — AB
Specific Gravity, Urine: 1.014 (ref 1.005–1.030)

## 2018-01-01 LAB — COMPREHENSIVE METABOLIC PANEL
ALT: 14 U/L (ref 0–44)
AST: 19 U/L (ref 15–41)
Albumin: 3.5 g/dL (ref 3.5–5.0)
Alkaline Phosphatase: 52 U/L (ref 38–126)
Anion gap: 6 (ref 5–15)
BUN: 19 mg/dL (ref 8–23)
CHLORIDE: 112 mmol/L — AB (ref 98–111)
CO2: 23 mmol/L (ref 22–32)
Calcium: 9.7 mg/dL (ref 8.9–10.3)
Creatinine, Ser: 1.46 mg/dL — ABNORMAL HIGH (ref 0.61–1.24)
GFR calc Af Amer: 52 mL/min — ABNORMAL LOW (ref >60)
GFR calc non Af Amer: 45 mL/min — ABNORMAL LOW (ref >60)
GLUCOSE: 104 mg/dL — AB (ref 70–99)
POTASSIUM: 4.2 mmol/L (ref 3.5–5.1)
Sodium: 141 mmol/L (ref 135–145)
Total Bilirubin: 0.3 mg/dL (ref 0.3–1.2)
Total Protein: 7.5 g/dL (ref 6.5–8.1)

## 2018-01-01 LAB — I-STAT CHEM 8, ED
BUN: 21 mg/dL (ref 8–23)
Calcium, Ion: 1.12 mmol/L — ABNORMAL LOW (ref 1.15–1.40)
Chloride: 111 mmol/L (ref 98–111)
Creatinine, Ser: 1.6 mg/dL — ABNORMAL HIGH (ref 0.61–1.24)
Glucose, Bld: 103 mg/dL — ABNORMAL HIGH (ref 70–99)
HEMATOCRIT: 37 % — AB (ref 39.0–52.0)
Hemoglobin: 12.6 g/dL — ABNORMAL LOW (ref 13.0–17.0)
Potassium: 4.2 mmol/L (ref 3.5–5.1)
Sodium: 144 mmol/L (ref 135–145)
TCO2: 24 mmol/L (ref 22–32)

## 2018-01-01 LAB — GLUCOSE, CAPILLARY
Glucose-Capillary: 99 mg/dL (ref 70–99)
Glucose-Capillary: 94 mg/dL (ref 70–99)

## 2018-01-01 LAB — CBC
HCT: 41.5 % (ref 39.0–52.0)
Hemoglobin: 12.2 g/dL — ABNORMAL LOW (ref 13.0–17.0)
MCH: 26.2 pg (ref 26.0–34.0)
MCHC: 29.4 g/dL — ABNORMAL LOW (ref 30.0–36.0)
MCV: 89.1 fL (ref 80.0–100.0)
NRBC: 0 % (ref 0.0–0.2)
PLATELETS: 279 10*3/uL (ref 150–400)
RBC: 4.66 MIL/uL (ref 4.22–5.81)
RDW: 16.2 % — ABNORMAL HIGH (ref 11.5–15.5)
WBC: 8.2 10*3/uL (ref 4.0–10.5)

## 2018-01-01 LAB — ETHANOL: Alcohol, Ethyl (B): <10 mg/dL (ref <10)

## 2018-01-01 LAB — CBG MONITORING, ED: GLUCOSE-CAPILLARY: 87 mg/dL (ref 70–99)

## 2018-01-01 LAB — PROTIME-INR
INR: 1.06
Prothrombin Time: 13.7 seconds (ref 11.4–15.2)

## 2018-01-01 LAB — I-STAT TROPONIN, ED: TROPONIN I, POC: 0.01 ng/mL (ref 0.00–0.08)

## 2018-01-01 LAB — APTT: APTT: 32 s (ref 24–36)

## 2018-01-01 MED ORDER — ACETAMINOPHEN 160 MG/5ML PO SOLN: 650.0000 mg | ORAL | Status: DC | PRN

## 2018-01-01 MED ORDER — STROKE: EARLY STAGES OF RECOVERY BOOK
Freq: Once | Status: AC
  Administered 2018-01-01: 07:00:00
  Filled 2018-01-01: qty 1

## 2018-01-01 MED ORDER — ASPIRIN 300 MG RE SUPP
300.0000 mg | Freq: Every day | RECTAL | Status: DC
  Administered 2018-01-01 – 2018-01-02 (×2): 300 mg via RECTAL
  Filled 2018-01-01 (×2): qty 1

## 2018-01-01 MED ORDER — ALLOPURINOL 100 MG PO TABS
200.0000 mg | ORAL_TABLET | Freq: Every day | ORAL | Status: DC
  Administered 2018-01-02 – 2018-01-05 (×4): 200 mg via ORAL
  Filled 2018-01-01 (×4): qty 2

## 2018-01-01 MED ORDER — ASPIRIN 325 MG PO TABS: 325.0000 mg | ORAL_TABLET | Freq: Every day | ORAL | Status: DC

## 2018-01-01 MED ORDER — SENNOSIDES-DOCUSATE SODIUM 8.6-50 MG PO TABS: 1.0000 | ORAL_TABLET | Freq: Every evening | ORAL | Status: DC | PRN

## 2018-01-01 MED ORDER — IOPAMIDOL (ISOVUE-370) INJECTION 76%
100.0000 mL | Freq: Once | INTRAVENOUS | Status: AC | PRN
  Administered 2018-01-01: 100 mL via INTRAVENOUS

## 2018-01-01 MED ORDER — SODIUM CHLORIDE 0.9 % IV SOLN
INTRAVENOUS | Status: AC
  Administered 2018-01-01 – 2018-01-02 (×2): via INTRAVENOUS

## 2018-01-01 MED ORDER — HYDRALAZINE HCL 20 MG/ML IJ SOLN
10.0000 mg | INTRAMUSCULAR | Status: DC | PRN
  Administered 2018-01-02 (×2): 10 mg via INTRAVENOUS
  Filled 2018-01-01 (×2): qty 1

## 2018-01-01 MED ORDER — ACETAMINOPHEN 325 MG PO TABS
650.0000 mg | ORAL_TABLET | ORAL | Status: DC | PRN
  Administered 2018-01-03 – 2018-01-04 (×4): 650 mg via ORAL
  Filled 2018-01-01 (×4): qty 2

## 2018-01-01 MED ORDER — PANTOPRAZOLE SODIUM 40 MG PO TBEC
40.0000 mg | DELAYED_RELEASE_TABLET | Freq: Every day | ORAL | Status: DC
  Administered 2018-01-02 – 2018-01-05 (×4): 40 mg via ORAL
  Filled 2018-01-01 (×4): qty 1

## 2018-01-01 MED ORDER — PRAVASTATIN SODIUM 40 MG PO TABS: 40.0000 mg | ORAL_TABLET | Freq: Every day | ORAL | Status: DC

## 2018-01-01 MED ORDER — ACETAMINOPHEN 650 MG RE SUPP
650.0000 mg | RECTAL | Status: DC | PRN
  Administered 2018-01-01: 650 mg via RECTAL
  Filled 2018-01-01: qty 1

## 2018-01-01 MED ORDER — ENOXAPARIN SODIUM 40 MG/0.4ML ~~LOC~~ SOLN
40.0000 mg | SUBCUTANEOUS | Status: DC
  Administered 2018-01-01 – 2018-01-05 (×5): 40 mg via SUBCUTANEOUS
  Filled 2018-01-01 (×5): qty 0.4

## 2018-01-01 MED ORDER — NIACIN ER (ANTIHYPERLIPIDEMIC) 500 MG PO TBCR
500.0000 mg | EXTENDED_RELEASE_TABLET | Freq: Every day | ORAL | Status: DC
  Administered 2018-01-02 – 2018-01-03 (×2): 500 mg via ORAL
  Filled 2018-01-01 (×4): qty 1

## 2018-01-01 NOTE — Code Documentation (Signed)
Responded to Code Stroke called at 0247.  Pt awoke from sleep and fell on floor around 0200.  At that time wife noticed L sided weakness. Pt arrived at 0303 with L sided weakness, L droop, dysarthria, and R sided gaze preference.  NIH-12, CBG-87. CT negative for acute changes. CTA and CTP performed.  Plan to admit pt to medical team.

## 2018-01-01 NOTE — ED Triage Notes (Signed)
Pt arrived via [facility]; pt from hm with c/o fall at approx 145a; pt went to restroom and wife heard a "loud thump"; EMS arrived and states that pt had slurred speech, R sided gaze and L sided wkness; LSW was 2130; CBG 116; 184/104; 18G RAC;

## 2018-01-01 NOTE — Progress Notes (Signed)
STROKE TEAM PROGRESS NOTE   SUBJECTIVE (INTERVAL HISTORY) His wife and daughter and granddaughters are at the bedside.  Pt lying in bed, very lethargic but able to open eyes with question and commands. Still has left UE weakness and dysphagia and dysarthria. MRI showed right MCA territory scattered infarcts, embolic pattern. Denies any heart palpitation or racing heart.    OBJECTIVE Vitals:   01/01/18 0545 01/01/18 0641 01/01/18 0841 01/01/18 1041  BP: (!) 186/96 (!) 184/97 (!) 188/102 (!) 199/102  Pulse: (!) 56 61 (!) 55 64  Resp: 15 17 19 17   Temp:  98.1 F (36.7 C) 98 F (36.7 C)   TempSrc:  Oral Oral   SpO2: 98% 99% 99% 99%  Weight:      Height:        CBC:  Recent Labs  Lab 01/01/18 0308 01/01/18 0315  WBC 8.2  --   NEUTROABS 3.7  --   HGB 12.2* 12.6*  HCT 41.5 37.0*  MCV 89.1  --   PLT 279  --     Basic Metabolic Panel:  Recent Labs  Lab 01/01/18 0308 01/01/18 0315  NA 141 144  K 4.2 4.2  CL 112* 111  CO2 23  --   GLUCOSE 104* 103*  BUN 19 21  CREATININE 1.46* 1.60*  CALCIUM 9.7  --     Lipid Panel:     Component Value Date/Time   CHOL 127 01/01/2018 0308   CHOL 186 09/07/2017 1617   TRIG 73 01/01/2018 0308   HDL 39 (L) 01/01/2018 0308   HDL 39 (L) 09/07/2017 1617   CHOLHDL 3.3 01/01/2018 0308   VLDL 15 01/01/2018 0308   LDLCALC 73 01/01/2018 0308   LDLCALC 126 (H) 09/07/2017 1617   HgbA1c:  Lab Results  Component Value Date   HGBA1C 5.8 (H) 01/01/2018   Urine Drug Screen:     Component Value Date/Time   LABOPIA NONE DETECTED 01/01/2018 0352   COCAINSCRNUR NONE DETECTED 01/01/2018 0352   LABBENZ NONE DETECTED 01/01/2018 0352   AMPHETMU NONE DETECTED 01/01/2018 0352   THCU NONE DETECTED 01/01/2018 0352   LABBARB NONE DETECTED 01/01/2018 0352    Alcohol Level     Component Value Date/Time   ETH <10 01/01/2018 0308    IMAGING  Ct Angio Head W Or Wo Contrast Ct Angio Neck W Or Wo Contrast Ct Angio Perfusion  Scan 01/01/2018 IMPRESSION:   CTA NECK:  1. No hemodynamically significant stenosis ICA's.  2. Mild stenosis RIGHT vertebral artery origin.   CTA HEAD:  1. No emergent large vessel occlusion.  . Moderate cerebral artery atherosclerosis.   CT PERFUSION:  1. Small acute RIGHT frontal/MCA penumbra though limited by motion artifact.   Aortic Atherosclerosis. Emphysema.   Mr Brain Wo Contrast 01/01/2018 IMPRESSION:  1. Acute/subacute right MCA territory infarcts involving the right frontal operculum an and diffusely along the right frontal and parietal lobes. The area along the convexity may be in a watershed distribution.  2. Additional confluent white matter changes are present bilaterally, likely reflecting the sequela of chronic microvascular ischemia.  3. Remote lacunar infarcts of the basal ganglia including a remote hemorrhagic infarct of the right thalamus.    Ct C-spine No Charge 01/01/2018 IMPRESSION:  1. No fracture or malalignment.  2. Moderate canal stenosis C5-6 and C6-7.  3. Moderate C4-5 through C6-7 neural foraminal narrowing.     Ct Head Code Stroke Wo Contrast 01/01/2018 IMPRESSION:  1. No acute intracranial process.  2.  ASPECTS is 10.  3. Moderate to severe chronic small vessel ischemic changes.  4. 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    Transthoracic Echocardiogram - pending 00/00/00  LE venous doppler - pending   PHYSICAL EXAM  Temp:  [98 F (36.7 C)-98.7 F (37.1 C)] 98.7 F (37.1 C) (11/02 1158) Pulse Rate:  [51-83] 63 (11/02 1441) Resp:  [13-24] 17 (11/02 1441) BP: (165-218)/(92-118) 193/98 (11/02 1441) SpO2:  [97 %-100 %] 100 % (11/02 1441) Weight:  [127.5 kg] 127.5 kg (11/02 0300)  General - Well nourished, well developed, lethargic.  Ophthalmologic - fundi not visualized due to noncooperation.  Cardiovascular - Regular rate and rhythm.  Mental Status -  Level of arousal and orientation to time, place, and person  were intact. Moderate dysarthria, no aphasia but paucity of speech, able to follow all simple commands, naming 2/3, repetition intact but dysarthric, left neglect.   Cranial Nerves II - XII - II - left visual neglect vs. hemianopia. III, IV, VI - right gaze preference, barely cross midline. V - Facial sensation intact bilaterally. VII - left facial droop VIII - Hearing & vestibular intact bilaterally. X - Palate elevates symmetrically, moderate dysarthria. XI - Chin turning & shoulder shrug intact bilaterally. XII - Tongue protrusion intact.  Motor Strength - The patient's strength was normal in all extremities except LUE 3-/5 proximal and 2/5 distally.  Bulk was normal and fasciculations were absent.   Motor Tone - Muscle tone was assessed at the neck and appendages and was normal.  Reflexes - The patient's reflexes were symmetrical in all extremities and he had no pathological reflexes.  Sensory - Light touch, temperature/pinprick were assessed and were symmetrical.    Coordination - The patient had normal movements in the right hand with no ataxia or dysmetria, but slow action.  Tremor was absent.  Gait and Station - deferred.    ASSESSMENT/PLAN Mr. KASON BENAK is a 77 y.o. male with history of CAD s/p CABG x 4, history of blood clots, HTN, HLD, MI, obesity, OSA and prior stroke.  presenting with acute onset of left facial droop, dysarthria and left hemiplegia. He did not receive IV t-PA due to late presentation.  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 cardioembolic.  Resultant dysarthria, left neglect, left UE weakness  CT head - No acute intracranial process.   MRI head - Acute/subacute right MCA territory infarcts involving the right frontal operculum an and diffusely along the right frontal and parietal lobes. Remote right thalamus hemorrhagic infarcts.  CTA H&N - right ICA bifurcation large soft plaque but no  significant stenosis  CT Perfusion - Small acute RIGHT frontal/MCA penumbra  2D Echo - pending  LE venous doppler - pending  Will consider TEE and loop recorder for evaluation of cardioembolic sourcse given hx of CAD/MI s/p CABG  LDL - 126  HgbA1c - 5.8  VTE prophylaxis - Lovenox  Diet - NPO  aspirin 81 mg daily prior to admission, now on aspirin 300 PR daily  Patient counseled to be compliant with his antithrombotic medications  Ongoing aggressive stroke risk factor management  Therapy recommendations:  pending  Disposition:  Pending  Dysphagia  Did not pass swallow  NPO for now  Speech following  CAD/MI s/p CABG  In 2005  On ASA and lovastatin at home  Denies CP or heart palpitation  Will consider TEE and loop given embolic stroke  Carotid plaque  Right ICA bifurcation large soft plaque  However, no significant stenosis  Stroke could be due to the non-stenotic plaque  But also need cardio embolic work up  Follow up with [facility] as outpt  Hypertension  BP tends to run high . Permissive hypertension (OK if < 220/120) but gradually normalize in 5-7 days . Long-term BP goal normotensive  Hyperlipidemia  Lipid lowering medication PTA:  Crestor 20 mg daily; Mevacor 20 mg daily ; Niaspan  LDL 126, goal < 70  Current lipid lowering medication once po access  Continue statin at discharge   Other Stroke Risk Factors  Advanced age  Former cigarette smoker - quit  Obesity, Body mass index is 36.09 kg/m., recommend weight loss, diet and exercise as appropriate   Hx stroke/TIA - on imaging right thalamus   Family hx stroke (mother, father and brother)  OSA  Other Active Problems   CKD - creatinine 1.6   Hospital day # 0  I spent  35 minutes in total face-to-face time with the patient, more than 50% of which was spent in counseling and coordination of care, reviewing test results, images and medication, and discussing the diagnosis of  right MCA infarct, dysphagia, carotid plaque, lethargy, treatment plan and potential prognosis. This patient's care requiresreview of multiple databases, neurological assessment, discussion with family, other specialists and medical decision making of high complexity. I had long discussion with wife and daughter and pt at bedside, updated pt current condition, treatment plan and potential prognosis. They expressed understanding and appreciation.   Rosalin Hawking, MD PhD Stroke Neurology 01/01/2018 3:49 PM    To contact Stroke Continuity provider, please refer to [email or web address] After hours, contact General Neurology

## 2018-01-01 NOTE — Progress Notes (Signed)
PT Cancellation Note  Patient Details Name: [name] MRN: [id number] DOB: 09/26/1940   Cancelled Treatment:    Reason Eval/Treat Not Completed: Patient at procedure or test/unavailable. PT off of the floor for MRI. PT will continue to follow acutely to as available.    [place] 01/01/2018, 9:15 AM

## 2018-01-01 NOTE — Progress Notes (Addendum)
  Speech Language Pathology Treatment: Dysphagia  Patient Details Name: [name] MRN: [id number] DOB: Jul 24, 1940 Today's Date: 01/01/2018 Time: 1410-3013 SLP Time Calculation (min) (ACUTE ONLY): 19 min  Assessment / Plan / Recommendation Clinical Impression  SLP visit with pt to determine readiness for MBS, RN reports no further vomiting and pt confirms.  Daughter in room with pt.  Pt asleep in recliner and did awake to SLP verbal/tactile stimulation.  SLP cued pt again to clear his throat and swallow.  Daughter reports pt has been coughing while sleeping.     SLP assisted pt to self feed applesauce via spoon - difficulty locating mouth - touching on right side of mouth initially.  Delayed oral transiting with oral pocketing on left noted but pt did clear eventually as oral suction completed without residuals noted.  Delayed multiple swallows present.  Pt required total cues to maintain alertness.  SLP ceased po after pt nodded off and did not stay alert, this was followed by pt coughing - concerning for retention/aspiration.  Recommend npo except oral moisture of soda *pt wants a coke terribly* and consider MBS next date when he can maintain LOA.  Informed daughter and RN to plan/recommendation and provided daughter with toothettes.       HPI HPI: pt is a 77 yo adm to [facility] after being found on the floor.  pmh + for cva, cad s/p cabg, cervical spondylosis, ckd, htn, sleep apnea.  pt with left facial droop and dysarthria as well as left sided weakness.  speech and swallow eval ordered.  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        SLP Plan     Patient needs continued Speech Lanaguage Pathology Services    Recommendations  Diet recommendations: NPO Medication Administration: Via alternative means                Oral Care Recommendations: Oral care QID Follow up Recommendations: Inpatient Rehab;Skilled Nursing facility(vs) SLP Visit  Diagnosis: Dysarthria and anarthria (R47.1);Cognitive communication deficit (R41.841)       GO                Macario Golds 01/01/2018, 1:05 PM  Luanna Salk, [place] SLP Acute Rehab Services Pager [phone] Office [phone]

## 2018-01-01 NOTE — H&P (Signed)
History and Physical    [name] ZTI:[id number] DOB: 27-Apr-1940 DOA: 01/01/2018  PCP: Rogers Blocker, MD  Patient coming from: Home.  Chief Complaint: Left-sided weakness.  HPI: [name] is a 77 y.o. male with history of CAD status post CABG, hypertension, chronic kidney disease, hyperlipidemia, sleep apnea was found on the floor by patient's wife around 2 AM this morning in the bathroom.  Patient's wife woke up after she heard a sound.  On exam patient was having left facial drooping dysarthria and left-sided weakness.  EMS was called and patient was brought in as a code stroke.  Last seen normal was around 9:30 PM.  Patient did not have any headache chest pain shortness of breath nausea vomiting or diarrhea.  ED Course: In the ER on exam patient has left-sided hemiplegia with unable to move his left upper extremity and minimal movement of his left lower extremity.  Left facial droop.  Dysarthria.  And right-sided gaze preference.  CT head followed by CT angiogram of the head and neck CT perfusion studies were done which showed no large vessel occlusion.  Showed stroke in the right frontal lobe.  Patient failed swallow.  On-call neurologist has been consulted.  Patient admitted for further stroke work-up.  Patient blood pressure is markedly elevated.   Review of Systems: As per HPI, rest all negative.   Past Medical History:  Diagnosis Date  . CAD (coronary artery disease)    s/p cath in 2005 and CABG x4  . Cataract   . DJD (degenerative joint disease)   . DJD (degenerative joint disease)   . Fluid retention    mild  . H/O: gout   . History of blood clots   . HTN (hypertension)   . Hyperlipidemia   . Hyperlipidemia   . Myocardial infarction ([place]) 2009  . Obesity    with reduction  . OSA (obstructive sleep apnea)    AHI 98/hr  . Renal insufficiency   . Sleep apnea   . Stroke [facility]) 2000    Past Surgical History:  Procedure Laterality Date  . CORONARY ARTERY BYPASS  GRAFT  2005   LIMA to LAD, SVG to OM1 and OM 2, RCA.   [name] left inguinal hernia repair    . TEE WITHOUT CARDIOVERSION N/A 12/11/2016   Procedure: TRANSESOPHAGEAL ECHOCARDIOGRAM (TEE);  Surgeon: [name], MD;  Location: [facility] ENDOSCOPY;  Service: Cardiovascular;  Laterality: N/A;     reports that he has quit smoking. His smoking use included cigars. He has never used smokeless tobacco. He reports that he does not drink alcohol or use drugs.  Allergies  Allergen Reactions  . Ramipril Cough    Family History  Problem Relation Age of Onset  . Gout Mother   . Stroke Mother   . Coronary artery disease Mother   . Hypertension Mother   . Arthritis Mother   . Coronary artery disease Father   . Arthritis Father   . Gout Father   . Stroke Father   . Hypertension Father   . Coronary artery disease Brother   . Arthritis Brother   . Gout Brother   . Stroke Brother   . Hypertension Brother   . Coronary artery disease Brother   . Arthritis Brother   . Gout Brother   . Stroke Brother   . Hypertension Brother   . Coronary artery disease Unknown        significant in multiple family members  Prior to Admission medications   Medication Sig Start Date End Date Taking? Authorizing Provider  allopurinol (ZYLOPRIM) 100 MG tablet Take 200 mg by mouth daily.    Yes Rogers Blocker, MD  aspirin EC 81 MG tablet Take 81 mg by mouth daily.   Yes [provider]  furosemide (LASIX) 20 MG tablet Take 20 mg by mouth 2 (two) times daily.     Yes [provider]  lovastatin (MEVACOR) 20 MG tablet TAKE ONE TABLET BY MOUTH TWICE DAILY. MUST HAVE OFFICE VISIT BEFORE FURTHER REFILL. Patient taking differently: Take 20 mg by mouth 2 (two) times daily.  12/21/14  Yes Fay Records, MD  metoprolol tartrate (LOPRESSOR) 25 MG tablet TAKE ONE TABLET BY MOUTH TWICE DAILY Patient taking differently: Take 25 mg by mouth 2 (two) times daily.  11/12/14  Yes Fay Records, MD  montelukast  (SINGULAIR) 10 MG tablet Take 10 mg by mouth at bedtime. 11/05/16  Yes [provider]  niacin (NIASPAN) 500 MG CR tablet Take 500 mg by mouth at bedtime.     Yes [provider]  omeprazole (PRILOSEC) 40 MG capsule Take 40 mg by mouth daily.   Yes [provider]  potassium chloride (K-DUR) 10 MEQ tablet Take 10 mEq by mouth 2 (two) times daily.   Yes [provider]  rosuvastatin (CRESTOR) 10 MG tablet Take 10 mg by mouth at bedtime.   Yes [provider]  amLODipine (NORVASC) 5 MG tablet TAKE ONE TABLET BY MOUTH ONCE DAILY. Patient not taking: Reported on 01/01/2018 09/07/17   Lendon Colonel, NP  phosphorus (K PHOS NEUTRAL) [phone] MG tablet Take 2 tablets (500 mg total) by mouth 3 (three) times daily. Patient not taking: Reported on 01/01/2018 12/12/16   Raiford Noble [place], DO    Physical Exam: Vitals:   01/01/18 0500 01/01/18 0515 01/01/18 0530 01/01/18 0545  BP: (!) 218/106 (!) 184/118 (!) 184/93 (!) 186/96  Pulse: 83 (!) 58 75 (!) 56  Resp: (!) 24 15 15 15   Temp:      TempSrc:      SpO2: 99% 98% 99% 98%  Weight:      Height:          Constitutional: Moderately built and nourished. Vitals:   01/01/18 0500 01/01/18 0515 01/01/18 0530 01/01/18 0545  BP: (!) 218/106 (!) 184/118 (!) 184/93 (!) 186/96  Pulse: 83 (!) 58 75 (!) 56  Resp: (!) 24 15 15 15   Temp:      TempSrc:      SpO2: 99% 98% 99% 98%  Weight:      Height:       Eyes: Anicteric no pallor. ENMT: No discharge from the ears eyes nose or mouth. Neck: No mass felt.  No neck rigidity.  No JVD appreciated. Respiratory: No rhonchi or crepitations. Cardiovascular: S1-S2 heard no murmurs appreciated. Abdomen: Soft nontender bowel sounds present. Musculoskeletal: No edema.  No joint effusion. Skin: No rash. Neurologic: Alert awake oriented to time place and person.  No facial droop.  Left upper extremity 0 x 5 in strength.  Left lower extremity 1 x 5 in strength.   Right upper and lower extremities 5 x 5 in strength.  Pupils equal reacting to light.  Tongue is midline. Psychiatric: Appears normal.   Labs on Admission: I have personally reviewed following labs and imaging studies  CBC: Recent Labs  Lab 01/01/18 0308 01/01/18 0315  WBC 8.2  --   NEUTROABS  3.7  --   HGB 12.2* 12.6*  HCT 41.5 37.0*  MCV 89.1  --   PLT 279  --    Basic Metabolic Panel: Recent Labs  Lab 01/01/18 0308 01/01/18 0315  NA 141 144  K 4.2 4.2  CL 112* 111  CO2 23  --   GLUCOSE 104* 103*  BUN 19 21  CREATININE 1.46* 1.60*  CALCIUM 9.7  --    GFR: Estimated Creatinine Clearance: 56.6 mL/min (A) (by C-G formula based on SCr of 1.6 mg/dL (H)). Liver Function Tests: Recent Labs  Lab 01/01/18 0308  AST 19  ALT 14  ALKPHOS 52  BILITOT 0.3  PROT 7.5  ALBUMIN 3.5   No results for input(s): LIPASE, AMYLASE in the last 168 hours. No results for input(s): AMMONIA in the last 168 hours. Coagulation Profile: Recent Labs  Lab 01/01/18 0308  INR 1.06   Cardiac Enzymes: No results for input(s): CKTOTAL, CKMB, CKMBINDEX, TROPONINI in the last 168 hours. BNP (last 3 results) No results for input(s): PROBNP in the last 8760 hours. HbA1C: No results for input(s): HGBA1C in the last 72 hours. CBG: Recent Labs  Lab 01/01/18 0306  GLUCAP 87   Lipid Profile: No results for input(s): CHOL, HDL, LDLCALC, TRIG, CHOLHDL, LDLDIRECT in the last 72 hours. Thyroid Function Tests: No results for input(s): TSH, T4TOTAL, FREET4, T3FREE, THYROIDAB in the last 72 hours. Anemia Panel: No results for input(s): VITAMINB12, FOLATE, FERRITIN, TIBC, IRON, RETICCTPCT in the last 72 hours. Urine analysis:    Component Value Date/Time   COLORURINE YELLOW 01/01/2018 [place] 01/01/2018 0352   LABSPEC 1.014 01/01/2018 0352   PHURINE 6.0 01/01/2018 0352   GLUCOSEU NEGATIVE 01/01/2018 0352   HGBUR NEGATIVE 01/01/2018 0352   BILIRUBINUR NEGATIVE 01/01/2018 0352    KETONESUR NEGATIVE 01/01/2018 0352   PROTEINUR 100 (A) 01/01/2018 0352   UROBILINOGEN 1.0 10/08/2008 2214   NITRITE NEGATIVE 01/01/2018 0352   LEUKOCYTESUR NEGATIVE 01/01/2018 0352   Sepsis Labs: @LABRCNTIP (procalcitonin:4,lacticidven:4) )No results found for this or any previous visit (from the past 240 hour(s)).   Radiological Exams on Admission: Ct Angio Head W Or Wo Contrast  Result Date: 01/01/2018 CLINICAL DATA:  Follow up code stroke. LEFT-sided weakness. History of stroke, hypertension and hyperlipidemia. EXAM: CT ANGIOGRAPHY HEAD AND NECK CT PERFUSION BRAIN TECHNIQUE: Multidetector CT imaging of the head and neck was performed using the standard protocol during bolus administration of intravenous contrast. Multiplanar CT image reconstructions and MIPs were obtained to evaluate the vascular anatomy. Carotid stenosis measurements (when applicable) are obtained utilizing NASCET criteria, using the distal internal carotid diameter as the denominator. Multiphase CT imaging of the brain was performed following IV bolus contrast injection. Subsequent parametric perfusion maps were calculated using RAPID software. CONTRAST:  139mL ISOVUE-370 IOPAMIDOL (ISOVUE-370) INJECTION 76% COMPARISON:  CT HEAD January 01, 2018 FINDINGS: CTA NECK FINDINGS: AORTIC ARCH: Normal appearance of the thoracic arch, normal branch pattern. Mild calcific atherosclerosis aortic arch. The origins of the innominate, left Common carotid artery and subclavian artery are widely patent. RIGHT CAROTID SYSTEM: Common carotid artery is patent. Calcific atherosclerosis resulting in less than 50% stenosis by NASCET criteria. Normal appearance of the internal carotid artery. LEFT CAROTID SYSTEM: Common carotid artery is patent. Mild calcific atherosclerosis of the carotid bifurcation without hemodynamically significant stenosis by NASCET criteria. Normal appearance of the internal carotid artery. VERTEBRAL ARTERIES:Left vertebral  artery is dominant. Calcific atherosclerosis resulting in mild stenosis RIGHT vertebral artery. Degenerative changes cervical spine  resultant mild extrinsic compression. SKELETON: No acute osseous process though bone windows have not been submitted. Status post median sternotomy. 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 Patient is edentulous. OTHER NECK: Soft tissues of the neck are nonacute though, not tailored for evaluation. UPPER CHEST: Included lung apices are clear. Mild centrilobular emphysema. No superior mediastinal lymphadenopathy. CTA HEAD FINDINGS: ANTERIOR CIRCULATION: Patent cervical internal carotid arteries, petrous, cavernous and supra clinoid internal carotid arteries. Patent anterior communicating artery. Patent anterior and middle cerebral arteries, moderate luminal irregularity compatible with atherosclerosis. No large vessel occlusion, significant stenosis, contrast extravasation or aneurysm. POSTERIOR CIRCULATION: Patent vertebral arteries, vertebrobasilar junction and basilar artery, as well as main branch vessels. Patent posterior cerebral arteries, moderate luminal irregularity compatible with atherosclerosis. No large vessel occlusion, significant stenosis, contrast extravasation or aneurysm. VENOUS SINUSES: Major dural venous sinuses are patent though not tailored for evaluation on this angiographic examination. ANATOMIC VARIANTS: None. DELAYED PHASE: Not performed. MIP images reviewed. CT Brain Perfusion Findings-motion degraded examination: CBF (<30%) Volume: 50mL Perfusion (Tmax>6.0s) volume: 70mL Mismatch Volume: 16mL Infarction Location:RIGHT frontal lobe. IMPRESSION: CTA NECK: 1. No hemodynamically significant stenosis ICA's. 2. Mild stenosis RIGHT vertebral artery origin. CTA HEAD: 1. No emergent large vessel occlusion. 2. Moderate cerebral artery atherosclerosis. CT PERFUSION: 1. Small acute RIGHT frontal/MCA penumbra though limited by  motion artifact. Critical Value/emergent results text paged to Dr.ERIC [facility] via AMION secure system on 01/01/2018 at 3:55 am, including interpreting physician's phone number. Aortic Atherosclerosis (ICD10-I70.0). Emphysema (ICD10-J43.9). Electronically Signed   By: Elon Alas M.D.   On: 01/01/2018 03:56   Ct Angio Neck W Or Wo Contrast  Result Date: 01/01/2018 CLINICAL DATA:  Follow up code stroke. LEFT-sided weakness. History of stroke, hypertension and hyperlipidemia. EXAM: CT ANGIOGRAPHY HEAD AND NECK CT PERFUSION BRAIN TECHNIQUE: Multidetector CT imaging of the head and neck was performed using the standard protocol during bolus administration of intravenous contrast. Multiplanar CT image reconstructions and MIPs were obtained to evaluate the vascular anatomy. Carotid stenosis measurements (when applicable) are obtained utilizing NASCET criteria, using the distal internal carotid diameter as the denominator. Multiphase CT imaging of the brain was performed following IV bolus contrast injection. Subsequent parametric perfusion maps were calculated using RAPID software. CONTRAST:  166mL ISOVUE-370 IOPAMIDOL (ISOVUE-370) INJECTION 76% COMPARISON:  CT HEAD January 01, 2018 FINDINGS: CTA NECK FINDINGS: AORTIC ARCH: Normal appearance of the thoracic arch, normal branch pattern. Mild calcific atherosclerosis aortic arch. The origins of the innominate, left Common carotid artery and subclavian artery are widely patent. RIGHT CAROTID SYSTEM: Common carotid artery is patent. Calcific atherosclerosis resulting in less than 50% stenosis by NASCET criteria. Normal appearance of the internal carotid artery. LEFT CAROTID SYSTEM: Common carotid artery is patent. Mild calcific atherosclerosis of the carotid bifurcation without hemodynamically significant stenosis by NASCET criteria. Normal appearance of the internal carotid artery. VERTEBRAL ARTERIES:Left vertebral artery is dominant. Calcific atherosclerosis  resulting in mild stenosis RIGHT vertebral artery. Degenerative changes cervical spine resultant mild extrinsic compression. SKELETON: No acute osseous process though bone windows have not been submitted. Status post median sternotomy. 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 Patient is edentulous. OTHER NECK: Soft tissues of the neck are nonacute though, not tailored for evaluation. UPPER CHEST: Included lung apices are clear. Mild centrilobular emphysema. No superior mediastinal lymphadenopathy. CTA HEAD FINDINGS: ANTERIOR CIRCULATION: Patent cervical internal carotid arteries, petrous, cavernous and supra clinoid internal carotid arteries. Patent anterior communicating artery. Patent anterior and middle cerebral arteries, moderate  luminal irregularity compatible with atherosclerosis. No large vessel occlusion, significant stenosis, contrast extravasation or aneurysm. POSTERIOR CIRCULATION: Patent vertebral arteries, vertebrobasilar junction and basilar artery, as well as main branch vessels. Patent posterior cerebral arteries, moderate luminal irregularity compatible with atherosclerosis. No large vessel occlusion, significant stenosis, contrast extravasation or aneurysm. VENOUS SINUSES: Major dural venous sinuses are patent though not tailored for evaluation on this angiographic examination. ANATOMIC VARIANTS: None. DELAYED PHASE: Not performed. MIP images reviewed. CT Brain Perfusion Findings-motion degraded examination: CBF (<30%) Volume: 34mL Perfusion (Tmax>6.0s) volume: 73mL Mismatch Volume: 64mL Infarction Location:RIGHT frontal lobe. IMPRESSION: CTA NECK: 1. No hemodynamically significant stenosis ICA's. 2. Mild stenosis RIGHT vertebral artery origin. CTA HEAD: 1. No emergent large vessel occlusion. 2. Moderate cerebral artery atherosclerosis. CT PERFUSION: 1. Small acute RIGHT frontal/MCA penumbra though limited by motion artifact. Critical Value/emergent results  text paged to Dr.ERIC [facility] via AMION secure system on 01/01/2018 at 3:55 am, including interpreting physician's phone number. Aortic Atherosclerosis (ICD10-I70.0). Emphysema (ICD10-J43.9). Electronically Signed   By: Elon Alas M.D.   On: 01/01/2018 03:56   Ct C-spine No Charge  Result Date: 01/01/2018 CLINICAL DATA:  Mechanical fall. EXAM: CT CERVICAL SPINE WITHOUT CONTRAST TECHNIQUE: Reformatted CT imaging of the cervical spine was performed from CT angiogram head and neck. Multiplanar CT image reconstructions were also generated. COMPARISON:  None. FINDINGS: ALIGNMENT: Straightened lordosis.  Vertebral bodies in alignment. SKULL BASE AND VERTEBRAE: Cervical vertebral bodies and posterior elements are intact. Moderate C4-5, moderate to severe C6-7 disc height loss, mild at C5-6. Multilevel uncovertebral hypertrophy and endplate spurring. No destructive bony lesions. 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 Calcified craniocervical ligaments. C5-6 calcified ligamentum flavum. SOFT TISSUES AND SPINAL CANAL: Nonacute. Nuchal ligament calcification. DISC LEVELS: Moderate canal stenosis C5-6 and C6-7. Moderate RIGHT C4-5, RIGHT C5-6 and bilateral C6-7 neural foraminal narrowing. UPPER CHEST: Lung apices are clear. OTHER: None. IMPRESSION: 1. No fracture or malalignment. 2. Moderate canal stenosis C5-6 and C6-7. 3. Moderate C4-5 through C6-7 neural foraminal narrowing. Electronically Signed   By: Elon Alas M.D.   On: 01/01/2018 04:18   Ct Cerebral Perfusion W Contrast  Result Date: 01/01/2018 CLINICAL DATA:  Follow up code stroke. LEFT-sided weakness. History of stroke, hypertension and hyperlipidemia. EXAM: CT ANGIOGRAPHY HEAD AND NECK CT PERFUSION BRAIN TECHNIQUE: Multidetector CT imaging of the head and neck was performed using the standard protocol during bolus administration of intravenous contrast. Multiplanar CT image reconstructions and MIPs were obtained to evaluate the  vascular anatomy. Carotid stenosis measurements (when applicable) are obtained utilizing NASCET criteria, using the distal internal carotid diameter as the denominator. Multiphase CT imaging of the brain was performed following IV bolus contrast injection. Subsequent parametric perfusion maps were calculated using RAPID software. CONTRAST:  169mL ISOVUE-370 IOPAMIDOL (ISOVUE-370) INJECTION 76% COMPARISON:  CT HEAD January 01, 2018 FINDINGS: CTA NECK FINDINGS: AORTIC ARCH: Normal appearance of the thoracic arch, normal branch pattern. Mild calcific atherosclerosis aortic arch. The origins of the innominate, left Common carotid artery and subclavian artery are widely patent. RIGHT CAROTID SYSTEM: Common carotid artery is patent. Calcific atherosclerosis resulting in less than 50% stenosis by NASCET criteria. Normal appearance of the internal carotid artery. LEFT CAROTID SYSTEM: Common carotid artery is patent. Mild calcific atherosclerosis of the carotid bifurcation without hemodynamically significant stenosis by NASCET criteria. Normal appearance of the internal carotid artery. VERTEBRAL ARTERIES:Left vertebral artery is dominant. Calcific atherosclerosis resulting in mild stenosis RIGHT vertebral artery. Degenerative changes cervical spine resultant mild extrinsic compression. SKELETON: No acute osseous process  though bone windows have not been submitted. Status post median sternotomy. 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 Patient is edentulous. OTHER NECK: Soft tissues of the neck are nonacute though, not tailored for evaluation. UPPER CHEST: Included lung apices are clear. Mild centrilobular emphysema. No superior mediastinal lymphadenopathy. CTA HEAD FINDINGS: ANTERIOR CIRCULATION: Patent cervical internal carotid arteries, petrous, cavernous and supra clinoid internal carotid arteries. Patent anterior communicating artery. Patent anterior and middle cerebral  arteries, moderate luminal irregularity compatible with atherosclerosis. No large vessel occlusion, significant stenosis, contrast extravasation or aneurysm. POSTERIOR CIRCULATION: Patent vertebral arteries, vertebrobasilar junction and basilar artery, as well as main branch vessels. Patent posterior cerebral arteries, moderate luminal irregularity compatible with atherosclerosis. No large vessel occlusion, significant stenosis, contrast extravasation or aneurysm. VENOUS SINUSES: Major dural venous sinuses are patent though not tailored for evaluation on this angiographic examination. ANATOMIC VARIANTS: None. DELAYED PHASE: Not performed. MIP images reviewed. CT Brain Perfusion Findings-motion degraded examination: CBF (<30%) Volume: 42mL Perfusion (Tmax>6.0s) volume: 26mL Mismatch Volume: 61mL Infarction Location:RIGHT frontal lobe. IMPRESSION: CTA NECK: 1. No hemodynamically significant stenosis ICA's. 2. Mild stenosis RIGHT vertebral artery origin. CTA HEAD: 1. No emergent large vessel occlusion. 2. Moderate cerebral artery atherosclerosis. CT PERFUSION: 1. Small acute RIGHT frontal/MCA penumbra though limited by motion artifact. Critical Value/emergent results text paged to Dr[facility]ERIC [facility] via AMION secure system on 01/01/2018 at 3:55 am, including interpreting physician's phone number. Aortic Atherosclerosis (ICD10-I70.0). Emphysema (ICD10-J43.9). Electronically Signed   By: Elon Alas M.D.   On: 01/01/2018 03:56   Ct Head Code Stroke Wo Contrast  Result Date: 01/01/2018 CLINICAL DATA:  Code stroke. LEFT-sided weakness. History of stroke, hypertension and hyperlipidemia. EXAM: CT HEAD WITHOUT CONTRAST TECHNIQUE: Contiguous axial images were obtained from the base of the skull through the vertex without intravenous contrast. COMPARISON:  MRI head March 17, 2013 FINDINGS: BRAIN: No intraparenchymal hemorrhage, mass effect nor midline shift. Old RIGHT cerebellar infarcts. Confluent pontine and  supratentorial white matter hypodensities. Old RIGHT basal ganglia infarcts with ex vacuo dilatation RIGHT frontal horn of the lateral ventricle. No acute large vascular territory infarcts. No abnormal extra-axial fluid collections. Basal cisterns are patent. VASCULAR: Moderate calcific atherosclerosis of the carotid siphons. SKULL: No skull fracture. No significant scalp soft tissue swelling. SINUSES/ORBITS: Mild paranasal sinus mucosal thickening. Mastoid air cells are well aerated.LEFT buphthalmos and ocular lens implant. OTHER: Patient is edentulous. ASPECTS [facility] Stroke Program Early CT Score) - Ganglionic level infarction (caudate, lentiform nuclei, internal capsule, insula, M1-M3 cortex): 7 - Supraganglionic infarction (M4-M6 cortex): 3 Total score (0-10 with 10 being normal): 10 IMPRESSION: 1. No acute intracranial process. 2. ASPECTS is 10. 3. Moderate to severe chronic small vessel ischemic changes. 4. 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 5. Critical Value/emergent results text paged to [place], Neurology via [place] secure system on 01/01/2018 at 3:27 am, including interpreting physician's phone number. Electronically Signed   By: Elon Alas M.D.   On: 01/01/2018 03:27    EKG: Independently reviewed.  Normal sinus rhythm.  Assessment/Plan Principal Problem:   Acute CVA (cerebrovascular accident) ([place]) Active Problems:   CAD (coronary artery disease)   Hypertensive urgency   CKD (chronic kidney disease) stage 2, GFR 60-89 ml/min    1. Acute CVA with left-sided hemiplegia and left facial droop and dysarthria -discussed with Dr. Cheral Marker on-call neurologist.  Patient failed swallow evaluation.  We will get speech therapist evaluation.  And aspirin.  Check hemoglobin A1c lipid panel.  Allow for permissive  hypertension.  Check MRI brain.  Patient was not a candidate for TPA since onset of time was beyond the window period.  Check 2D echo.  Physical therapy  consult. 2. Hypertensive urgency -allow for permissive hypertension PRN IV hydralazine for now.  Closely follow blood pressure trends. 3. Chronic kidney disease stage II creatinine appears to be at baseline.  Follow metabolic panel closely. 4. History of CAD status post CABG denies any chest pain. 5. Normocytic normochromic anemia likely from renal disease.  Follow CBC.   DVT prophylaxis: Lovenox. Code Status: Full code. Family Communication: Discussed with patient. Disposition Plan: Home. Consults called: Neurology. Admission status: Inpatient.   Rise Patience MD [facility] Pager [phone].  If 7PM-7AM, please contact night-coverage [email or web address] Password [facility]  01/01/2018, 6:24 AM

## 2018-01-01 NOTE — Evaluation (Addendum)
Clinical/Bedside Swallow Evaluation Patient Details  Name: [name] MRN: [id number] Date of Birth: 10-06-1940  Today's Date: 01/01/2018 Time: SLP Start Time (ACUTE ONLY): 1021 SLP Stop Time (ACUTE ONLY): 1036 SLP Time Calculation (min) (ACUTE ONLY): 15 min  Past Medical History:  Past Medical History:  Diagnosis Date  . CAD (coronary artery disease)    s/p cath in 2005 and CABG x4  . Cataract   . DJD (degenerative joint disease)   . DJD (degenerative joint disease)   . Fluid retention    mild  . H/O: gout   . History of blood clots   . HTN (hypertension)   . Hyperlipidemia   . Hyperlipidemia   . Myocardial infarction ([place]) 2009  . Obesity    with reduction  . OSA (obstructive sleep apnea)    AHI 98/hr  . Renal insufficiency   . Sleep apnea   . Stroke [facility]) 2000   Past Surgical History:  Past Surgical History:  Procedure Laterality Date  . CORONARY ARTERY BYPASS GRAFT  2005   LIMA to LAD, SVG to OM1 and OM 2, RCA.   [name] left inguinal hernia repair    . TEE WITHOUT CARDIOVERSION N/A 12/11/2016   Procedure: TRANSESOPHAGEAL ECHOCARDIOGRAM (TEE);  Surgeon: [name], MD;  Location: [facility] ENDOSCOPY;  Service: Cardiovascular;  Laterality: N/A;   HPI:  pt is a 77 yo adm [facility] after being found on the floor.  pmh + for cva, cad s/p cabg, cervical spondylosis, ckd, htn, sleep apnea.  pt with left facial droop and dysarthria as well as left sided weakness.  speech and swallow eval ordered.  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   pt is a 77 yo adm to [facility] after being found on the floor.  pmh + for cva, cad s/p cabg, cervical spondylosis, ckd, htn, sleep apnea.  pt with left facial droop and dysarthria as well as left sided weakness.  speech and swallow eval ordered.  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   Assessment / Plan / Recommendation Clinical Impression  Patient  currently presents with indications of facial, probable trigimenal, hypoglossal, glossopharyngeal and vagal nerve involvement resulting in decreased ability for pt to manage secretions.  Pt drooling from left and demonstrating wet vocal quality without awareness.  Cued throat clearing and reswallow helpful to eliminate wet voice but recurrence noted.  Pt just vomited with oral care prior to slp visit, therefore po not administered.  Pt will require MBS prior to po administration due to multifactorial dysphagia.  Set up oral suction and demonstrated its use the pt/family.  Of note, pt also with left inattention vs/ and/or visual difficulties.  He could not read large print on name badge - glasses not on but print was large.  Informed family and pt to clear his throat and swallow if voice is gurgly.  Will follow up for readiness for MBS.  Pt/family agreeable.  SLP Visit Diagnosis: Dysphagia, oropharyngeal phase (R13.12)    Aspiration Risk  Severe aspiration risk;Risk for inadequate nutrition/hydration    Diet Recommendation NPO;Other (Comment)   Medication Administration: Via alternative means    Other  Recommendations Oral Care Recommendations: Oral care QID   Follow up Recommendations (tbd, ? CIR)      Frequency and Duration   tbd         Prognosis Prognosis for Safe Diet  Advancement: Guarded      Swallow Study   General Date of Onset: 01/01/18 HPI: pt is a 77 yo adm [facility] after being found on the floor.  pmh + for cva, cad s/p cabg, cervical spondylosis, ckd, htn, sleep apnea.  pt with left facial droop and dysarthria as well as left sided weakness.  speech and swallow eval ordered.  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   Type of Study: Bedside Swallow Evaluation Diet Prior to this Study: NPO Respiratory Status: Room air Behavior/Cognition: Alert;Cooperative;Pleasant mood Oral Cavity Assessment: Excessive secretions Oral Care  Completed by SLP: Recent completion by staff Oral Cavity - Dentition: Edentulous;Other (Comment)(has upper dentures, did not place due to pt just having vomi) Vision: Impaired for self-feeding(? left inattention vs vision deficits, pt looks to the right) Patient Positioning: Upright in bed Baseline Vocal Quality: Wet;Suspected CN X (Vagus) involvement Volitional Cough: Other (Comment) Volitional Swallow: Able to elicit    Oral/Motor/Sensory Function Overall Oral Motor/Sensory Function: Severe impairment Facial ROM: Suspected CN VII (facial) dysfunction;Reduced left Facial Symmetry: Suspected CN VII (facial) dysfunction;Abnormal symmetry left Facial Strength: Reduced left;Suspected CN VII (facial) dysfunction Facial Sensation: Reduced left;Suspected CN V (Trigeminal) dysfunction Lingual Strength: Reduced Velum: Suspected CN X (Vagus) dysfunction   Ice Chips Ice chips: Not tested   Thin Liquid Thin Liquid: Not tested    Nectar Thick Nectar Thick Liquid: Not tested   Honey Thick Honey Thick Liquid: Not tested   Puree Puree: Not tested   Solid     Solid: Not tested      [name] 01/01/2018,11:11 AM Shane Salk, MS [place] Pager [phone] Office [phone]

## 2018-01-01 NOTE — ED Notes (Signed)
Blood tubed to main lab. 

## 2018-01-01 NOTE — ED Provider Notes (Signed)
TIME SEEN: 3:12 AM  CHIEF COMPLAINT: Code stroke  HPI: Patient is a 77 year old male with history of hypertension, hyperlipidemia, previous stroke who presents to the emergency department after his wife heard him fall at 1:45 AM when he got up to the bathroom.  He was last seen normal at 9:30 PM last night.  Found to have a right gaze preference, slurred speech and left-sided weakness.  Blood glucose with EMS was 116 and last blood pressure was 184/104.  It is unknown if he is on any blood thinners.  ROS: See HPI Constitutional: no fever  Eyes: no drainage  ENT: no runny nose   Cardiovascular:  no chest pain  Resp: no SOB  GI: no vomiting GU: no dysuria Integumentary: no rash  Allergy: no hives  Musculoskeletal: no leg swelling  Neurological: no slurred speech ROS otherwise negative  PAST MEDICAL HISTORY/PAST SURGICAL HISTORY:  Past Medical History:  Diagnosis Date  . CAD (coronary artery disease)    s/p cath in 2005 and CABG x4  . Cataract   . DJD (degenerative joint disease)   . DJD (degenerative joint disease)   . Fluid retention    mild  . H/O: gout   . History of blood clots   . HTN (hypertension)   . Hyperlipidemia   . Hyperlipidemia   . Myocardial infarction ([place]) 2009  . Obesity    with reduction  . OSA (obstructive sleep apnea)    AHI 98/hr  . Renal insufficiency   . Sleep apnea   . Stroke [facility]) 2000    MEDICATIONS:  Prior to Admission medications   Medication Sig Start Date End Date Taking? Authorizing Provider  allopurinol (ZYLOPRIM) 100 MG tablet Take 200 mg by mouth daily.     Rogers Blocker, MD  amLODipine (NORVASC) 5 MG tablet TAKE ONE TABLET BY MOUTH ONCE DAILY. 09/07/17   Lendon Colonel, NP  aspirin 81 MG tablet Take 81 mg by mouth daily.      [provider]  furosemide (LASIX) 20 MG tablet Take 20 mg by mouth 2 (two) times daily.      [provider]  lovastatin (MEVACOR) 20 MG tablet TAKE ONE TABLET BY MOUTH TWICE DAILY. MUST  HAVE OFFICE VISIT BEFORE FURTHER REFILL. Patient taking differently: TAKE ONE 20 mg TABLET BY MOUTH TWICE DAILY. MUST HAVE OFFICE VISIT BEFORE FURTHER REFILL. 12/21/14   Fay Records, MD  metoprolol tartrate (LOPRESSOR) 25 MG tablet TAKE ONE TABLET BY MOUTH TWICE DAILY 11/12/14   Fay Records, MD  montelukast (SINGULAIR) 10 MG tablet Take 10 mg by mouth at bedtime. 11/05/16   [provider]  niacin (NIASPAN) 500 MG CR tablet Take 500 mg by mouth at bedtime.      [provider]  olmesartan (BENICAR) 40 MG tablet Take 40 mg by mouth daily.      [provider]  phosphorus (K PHOS NEUTRAL) 155-852-130 MG tablet Take 2 tablets (500 mg total) by mouth 3 (three) times daily. 12/12/16   Sheikh, Georgina Quint Latif, DO  polyethylene glycol powder (GLYCOLAX/MIRALAX) powder Take 17 g by mouth daily as needed for constipation. 10/27/16   [provider]  Vitamin D, Cholecalciferol, 1000 units CAPS Take by mouth.    [provider]    ALLERGIES:  Allergies  Allergen Reactions  . Ramipril Cough    SOCIAL HISTORY:  Social History   Tobacco Use  . Smoking status: Former Smoker    Types: Cigars  .  Smokeless tobacco: Never Used  . Tobacco comment: used to smoke cigars, 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, none since  Substance Use Topics  . Alcohol use: No    FAMILY HISTORY: Family History  Problem Relation Age of Onset  . Gout Mother   . Stroke Mother   . Coronary artery disease Mother   . Hypertension Mother   . Arthritis Mother   . Coronary artery disease Father   . Arthritis Father   . Gout Father   . Stroke Father   . Hypertension Father   . Coronary artery disease Brother   . Arthritis Brother   . Gout Brother   . Stroke Brother   . Hypertension Brother   . Coronary artery disease Brother   . Arthritis Brother   . Gout Brother   . Stroke Brother   . Hypertension Brother   . Coronary artery disease Unknown        significant in multiple family  members    EXAM: BP (!) 172/95 (BP Location: Right Arm)   Pulse 73   Temp 98.4 F (36.9 C) (Oral)   Resp 20   Ht 6\' 2"  (1.88 m)   Wt 127.5 kg   SpO2 99%   BMI 36.09 kg/m  CONSTITUTIONAL: Alert and oriented and responds appropriately to questions.  Elderly HEAD: Normocephalic EYES: Conjunctivae clear, pupils appear equal, EOMI ENT: normal nose; moist mucous membranes NECK: Supple, no meningismus, no nuchal rigidity, no LAD  CARD: RRR; S1 and S2 appreciated; no murmurs, no clicks, no rubs, no gallops RESP: Normal chest excursion without splinting or tachypnea; breath sounds clear and equal bilaterally; no wheezes, no rhonchi, no rales, no hypoxia or respiratory distress, speaking full sentences ABD/GI: Normal bowel sounds; non-distended; soft, non-tender, no rebound, no guarding, no peritoneal signs, no hepatosplenomegaly BACK:  The back appears normal and is non-tender to palpation, there is no CVA tenderness EXT: Normal ROM in all joints; non-tender to palpation; no edema; normal capillary refill; no cyanosis, no calf tenderness or swelling    SKIN: Normal color for age and race; warm; no rash NEURO: Patient has significant left upper extremity weakness is unable to hold his arm up against gravity.  He has some left lower extremity weakness compared to the right.  Patient has a right gaze preference.  He has dysarthria on exam. PSYCH: The patient's mood and manner are appropriate. Grooming and personal hygiene are appropriate.  MEDICAL DECISION MAKING: Patient here as a code stroke.  Normal blood glucose.  Outside of TPA window at this time.  Taken emergently to CT scan.  Dr. Cheral Marker with neurology at bedside.  ED PROGRESS: CT scan concerning for acute right frontal/MCA infarct.  Patient will not be receiving TPA given he is outside of the window.  Will admit to medicine.   4:22 AM Discussed patient's case with hospitalist, Dr. Hal Hope.  I have recommended admission and patient  (and family if present) agree with this plan. Admitting physician will place admission orders.   I reviewed all nursing notes, vitals, pertinent previous records, EKGs, lab and urine results, imaging (as available).     EKG Interpretation  Date/Time:  Saturday January 01 2018 03:56:41 EDT Ventricular Rate:  72 PR Interval:    QRS Duration: 103 QT Interval:  398 QTC Calculation: 436 R Axis:   18 Text Interpretation:  Sinus rhythm Atrial premature complex Prolonged PR interval Probable left atrial enlargement Low voltage, precordial leads No significant change since last tracing  Confirmed by Pryor Curia [phone]) on 01/01/2018 4:39:03 AM             Nitesh Pitstick, Delice Bison, DO 01/01/18 1995

## 2018-01-01 NOTE — Evaluation (Signed)
Speech Language Pathology Evaluation Patient Details Name: [name] MRN: [id number] DOB: 1940-07-16 Today's Date: 01/01/2018 Time: 3785-8850 SLP Time Calculation (min) (ACUTE ONLY): 16 min  Problem List:  Patient Active Problem List   Diagnosis Date Noted  . Acute CVA (cerebrovascular accident) ([place]) 01/01/2018  . Hypertensive urgency 01/01/2018  . CKD (chronic kidney disease) stage 2, GFR 60-89 ml/min 01/01/2018  . Generalized weakness   . Sepsis ([place]) 12/10/2016  . Bacteremia due to Streptococcus 12/10/2016  . Lactic acidosis 12/10/2016  . Elevated troponin 12/10/2016  . Renal insufficiency 12/10/2016  . Hypophosphatemia 12/10/2016  . Left leg swelling 12/10/2016  . Febrile illness 12/09/2016  . Febrile illness, acute   . OBESITY, UNSPECIFIED 05/31/2009  . Hyperlipidemia 05/30/2009  . Obstructive sleep apnea 05/30/2009  . Essential hypertension 05/30/2009  . CAD (coronary artery disease) 05/30/2009   Past Medical History:  Past Medical History:  Diagnosis Date  . CAD (coronary artery disease)    s/p cath in 2005 and CABG x4  . Cataract   . DJD (degenerative joint disease)   . DJD (degenerative joint disease)   . Fluid retention    mild  . H/O: gout   . History of blood clots   . HTN (hypertension)   . Hyperlipidemia   . Hyperlipidemia   . Myocardial infarction ([place]) 2009  . Obesity    with reduction  . OSA (obstructive sleep apnea)    AHI 98/hr  . Renal insufficiency   . Sleep apnea   . Stroke [facility]) 2000   Past Surgical History:  Past Surgical History:  Procedure Laterality Date  . CORONARY ARTERY BYPASS GRAFT  2005   LIMA to LAD, SVG to OM1 and OM 2, RCA.   [name] left inguinal hernia repair    . TEE WITHOUT CARDIOVERSION N/A 12/11/2016   Procedure: TRANSESOPHAGEAL ECHOCARDIOGRAM (TEE);  Surgeon: [name], MD;  Location: [facility] ENDOSCOPY;  Service: Cardiovascular;  Laterality: N/A;   HPI:  pt is a 77 yo adm [facility] after being found on the floor.   pmh + for cva, cad s/p cabg, cervical spondylosis, ckd, htn, sleep apnea.  pt with left facial droop and dysarthria as well as left sided weakness.  speech and swallow eval ordered.  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  Pt has old cva but wife reports his speech was clear prior to this event.    Assessment / Plan / Recommendation Clinical Impression  Patient presents with ? left inattention, significant dysarthria resulting in imprecise articulation and decreased phonatory strength negatively impacting speech intelligiblity.  Pt only spoke at phrase level and was noted to frequently close his eyes.  He benefited from verbal cues to speak loud as this improved his intelligiblity.  Pt was able to recall SlP' name within a few minutes and able to reiterated aspiration precautions.  He did demonstrate decreased awareness to significance of physical deficits requiring signifcant assist from RN/NT to transfer to chair.  Informed family to have pt use his call bell for needs.  Pt will benefit from SLP for dysarthria, dysphagia and cognition skills to decrease caregiver burden.      SLP Assessment  SLP Recommendation/Assessment: Patient needs continued Speech Lanaguage Pathology Services SLP Visit Diagnosis: Dysarthria and anarthria (R47.1);Cognitive communication deficit (R41.841)    Follow Up Recommendations  Inpatient Rehab;Skilled Nursing facility(vs)    Frequency and Duration min 2x/week  2 weeks  SLP Evaluation Cognition  Arousal/Alertness: Awake/alert Orientation Level: Oriented X4 Attention: Selective Memory: Appears intact Awareness: Appears intact(for swallow and speech impairment, poor awareness to difficulty in being transferred to chair due to gross weakness) Problem Solving: Impaired(for basic, to use call bell ) Problem Solving Impairment: Functional basic Safety/Judgment: Impaired       Comprehension  Auditory  Comprehension Overall Auditory Comprehension: Impaired Yes/No Questions: Not tested Commands: Impaired One Step Basic Commands: 25-49% accurate Conversation: Simple Interfering Components: Attention;Visual impairments;Other (comment)(sleepiness) EffectiveTechniques: Repetition;Increased volume Visual Recognition/Discrimination Discrimination: Not tested Reading Comprehension Reading Status: Impaired Functional Environmental (signs, name badge): Impaired(unable to read slp badge despite slp pointing to each letter) Interfering Components: Visual perceptual;Visual acuity;Left neglect/inattention(? visual deficit and left inattention)    Expression Expression Primary Mode of Expression: Verbal Verbal Expression Overall Verbal Expression: Appears within functional limits for tasks assessed Initiation: No impairment Level of Generative/Spontaneous Verbalization: Phrase Repetition: (dnt) Naming: Not tested Pragmatics: No impairment Written Expression Dominant Hand: Right Written Expression: Not tested   Oral / Motor  Oral Motor/Sensory Function Overall Oral Motor/Sensory Function: Severe impairment Facial ROM: Suspected CN VII (facial) dysfunction;Reduced left Facial Symmetry: Suspected CN VII (facial) dysfunction;Abnormal symmetry left Facial Strength: Reduced left;Suspected CN VII (facial) dysfunction Facial Sensation: Reduced left;Suspected CN V (Trigeminal) dysfunction Lingual Strength: Reduced Velum: Suspected CN X (Vagus) dysfunction Motor Speech Overall Motor Speech: Impaired Respiration: Impaired Level of Impairment: Word Phonation: Wet Articulation: Impaired Level of Impairment: Word Intelligibility: Intelligibility reduced Word: 75-100% accurate Phrase: 50-74% accurate Motor Planning: Not tested(doubtful however) Interfering Components: Inadequate dentition Effective Techniques: Slow rate;Increased vocal intensity   GO                    Macario Golds 01/01/2018, 11:24 AM  Luanna Salk, MS [facility] SLP [place] Pager [phone] Office [phone]

## 2018-01-01 NOTE — Progress Notes (Signed)
Received the patient from ED, with accompany of his wife, alert and oriented X4, oriented to the room, call light in reach, placed on Tele and verified. Continue to monitor

## 2018-01-01 NOTE — Evaluation (Signed)
SLP Cancellation Note  Patient Details Name: STOY FENN MRN: [id number] DOB: 1940-05-05   Cancelled treatment:       Reason Eval/Treat Not Completed: Patient at procedure or test/unavailable;Fatigue/lethargy limiting ability to participate(pt lethargic and going for MRI at this time, will continue efforts)   Macario Golds 01/01/2018, 8:46 AM   Luanna Salk, MS [facility] SLP Acute Rehab Services Pager [phone] Office [phone]

## 2018-01-01 NOTE — Consult Note (Signed)
Referring Physician: Dr. Leonides Schanz    Chief Complaint: Acute onset of left facial droop, dysarthria and left hemiplegia  HPI: [name] is an 77 y.o. male presenting to the ED via EMS with acute onset of left facial droop, dysarthria and left hemiplegia. LKN was 2130. Symptoms were noted at about 2:00 AM when he awoke from sleep and wife heard a loud thud from him falling, then noted left sided weakness. His wife stated he was on a "blood thinner", but his medication pouch does not have a blood thinner in it.   PMHx includes CAD s/p CABG x 4, history of blood clots, HTN, HLD, MI, obesity, OSA and prior stroke.   [id number] tPA Given: No: Out of time window  Past Medical History:  Diagnosis Date  . CAD (coronary artery disease)    s/p cath in 2005 and CABG x4  . Cataract   . DJD (degenerative joint disease)   . DJD (degenerative joint disease)   . Fluid retention    mild  . H/O: gout   . History of blood clots   . HTN (hypertension)   . Hyperlipidemia   . Hyperlipidemia   . Myocardial infarction ([place]) 2009  . Obesity    with reduction  . OSA (obstructive sleep apnea)    AHI 98/hr  . Renal insufficiency   . Sleep apnea   . Stroke [facility]) 2000    Past Surgical History:  Procedure Laterality Date  . CORONARY ARTERY BYPASS GRAFT  2005   LIMA to LAD, SVG to OM1 and OM 2, RCA.   [name] left inguinal hernia repair    . TEE WITHOUT CARDIOVERSION N/A 12/11/2016   Procedure: TRANSESOPHAGEAL ECHOCARDIOGRAM (TEE);  Surgeon: [name], MD;  Location: [facility] ENDOSCOPY;  Service: Cardiovascular;  Laterality: N/A;    Family History  Problem Relation Age of Onset  . Gout Mother   . Stroke Mother   . Coronary artery disease Mother   . Hypertension Mother   . Arthritis Mother   . Coronary artery disease Father   . Arthritis Father   . Gout Father   . Stroke Father   . Hypertension Father   . Coronary artery disease Brother   . Arthritis Brother   . Gout Brother   . Stroke Brother   .  Hypertension Brother   . Coronary artery disease Brother   . Arthritis Brother   . Gout Brother   . Stroke Brother   . Hypertension Brother   . Coronary artery disease Unknown        significant in multiple family members   Social History:  reports that he has quit smoking. His smoking use included cigars. He has never used smokeless tobacco. He reports that he does not drink alcohol or use drugs.  Allergies:  Allergies  Allergen Reactions  . Ramipril Cough    Home Medications:   ROS: Denies headache and neck pain. Detailed ROS deferred due to acuity of presentation.   Physical Examination: There were no vitals taken for this visit.  General: Morbidly obese HEENT: [place]/AT Lungs: Respirations unlabored Ext: Prominent nonpitting edema and trophic changes involving distal lower extremities bilaterally  Neurologic Examination: Mental Status: Awake and alert. Fully oriented. Able to answer all questions with intact grammar and syntax. Severely dysarthric speech. Able to name all objects presented to him.  Cranial Nerves: II:  Visual fields grossly normal with confrontation testing at bedside. Right pupil 3 mm >> 2 mm, left pupil 4  mm >> 3 mm III,IV, VI: Right gaze deviation. Can volitionally deviate leftwards to the midline but not past the midline. No nystagmus. Unable to test oculocephalic reflex due to c-collar V,VII: Severe left facial droop. Hyperesthesia to temp sensation on the left VIII: hearing intact to voice IX,X: Unable to visualize palate XI: Head preferentially turned to the right XII: Does not protrude tongue fully for assessment Motor: RUE and RLE 5/5 without drift LUE: Flaccid tone, 0/5 LLE: Able to elevate antigravity and resist examiner with 4/5 strength Sensory: Hyperesthesia to temp on the left. Absent FT sensation to LLE.  Deep Tendon Reflexes:  1+ bilateral upper extremities.  Plantars:  Right: downgoing   Left: equivocal  Cerebellar: No gross ataxia  noted with RUE movement.  Gait: Unable to assess    Results for orders placed or performed during the hospital encounter of 01/01/18 (from the past 48 hour(s))  CBG monitoring, ED     Status: None   Collection Time: 01/01/18  3:06 AM  Result Value Ref Range   Glucose-Capillary 87 70 - 99 mg/dL   No results found.  Assessment: 77 y.o. male presenting with symptoms and signs most consistent with a right frontal lobe ischemic infarction. 1. CT with no hemorrhage 2. CTA with no LVO 3. CTP with small acute RIGHT frontal/MCA penumbra though limited by motion artifact 4. Stroke Risk Factors -  CAD s/p CABG x 4, history of blood clots, HTN, HLD, MI, obesity, OSA and prior stroke.  Plan: 1. Not a tPA candidate due to time criteria. Not an endovascular candidate due to no LVO on CTA.  2. MRI of the brain without contrast 3. PT consult, OT consult, Speech consult 4. Echocardiogram 5. HgbA1c, fasting lipid panel 6. Prophylactic therapy- Classifiable as having failed ASA monotherapy. Add Plavix to ASA.  7. Risk factor modification 8. Telemetry monitoring 9. Frequent neuro checks 10. Continue Lovastatin 11. Permissive HTN x 24 hours, then lower BP by 15% per day towards a long term goal of 120/80   @Electronically  signed: Dr. Kerney Elbe 01/01/2018, 3:15 AM

## 2018-01-01 NOTE — Progress Notes (Signed)
PROGRESS NOTE                                                                                                                                                                                                             Patient Demographics:    [name], is a 77 y.o. male, DOB - [birth year], LEX:[id number]  Admit date - 01/01/2018   Admitting Physician Rise Patience, MD  Outpatient Primary MD for the patient is Rogers Blocker, MD  LOS - 0  Chief Complaint  Patient presents with  . Code Stroke       Brief Narrative  MAJD TISSUE is a 77 y.o. male with history of CAD status post CABG, hypertension, chronic kidney disease, hyperlipidemia, sleep apnea was found on the floor by patient's wife around 2 AM this morning in the bathroom.  Patient's wife woke up after she heard a sound.  On exam patient was having left facial drooping dysarthria and left-sided weakness.  He was diagnosed with CVA and admitted to the hospital.   Subjective:    Westyn Klabunde today in bed in no distress, denies any headache, he does not realize that his left side is weak, denies any chest or abdominal pain.   Assessment  & Plan :     1.  Left-sided weakness due to acute right MCA territory infarcts noted on MRI.  CTA head and neck unremarkable, full stroke work-up underway, currently on rectal aspirin, resume home dose statin and niacin once taking orally, neuro on board.  Will complete stroke work-up including speech evaluation and adjust medications as needed.  May require placement.  Lab Results  Component Value Date   HGBA1C 5.8 (H) 01/01/2018   Lab Results  Component Value Date   CHOL 127 01/01/2018   HDL 39 (L) 01/01/2018   LDLCALC 73 01/01/2018   TRIG 73 01/01/2018   CHOLHDL 3.3 01/01/2018     2.  CKD 4.  Creatinine close to his baseline of 1.4.  3.  Essential hypertension.  Allow for permissive hypertension for stroke.  PRN hydralazine with parameters.  4.  CAD.  No acute  issue.  Continue aspirin and statin for now, resume beta-blocker once blood pressure requirements have eased.  5.  Dyslipidemia.  Resume home dose statin and niacin once taking oral.  6.  GERD.  On PPI.  7. HX Gout. Resume allopurinol once taking orally.    Family Communication  :  None  Code Status :  Full  Disposition Plan  :  TBD  Consults  :  Neuro  Procedures  :    MRI - 1. Acute/subacute right MCA territory infarcts involving the right frontal operculum an and diffusely along the right frontal and parietal lobes. The area along the convexity may be in a watershed distribution. 2. Additional confluent white matter changes are present bilaterally, likely reflecting the sequela of chronic microvascular ischemia. 3. Remote lacunar infarcts of the basal ganglia including a remote hemorrhagic infarct of the right thalamus  TTE  CTA -  CTA NECK: 1. No hemodynamically significant stenosis ICA's. 2. Mild stenosis RIGHT vertebral artery origin. CTA HEAD: 1. No emergent large vessel occlusion. 2. Moderate cerebral artery atherosclerosis. CT PERFUSION: 1. Small acute RIGHT frontal/MCA penumbra though limited by motion artifact  DVT Prophylaxis  :  Lovenox   Lab Results  Component Value Date   PLT 279 01/01/2018    Diet :  Diet Order    None       Inpatient Medications Scheduled Meds: . aspirin  300 mg Rectal Daily   Or  . aspirin  325 mg Oral Daily  . enoxaparin (LOVENOX) injection  40 mg Subcutaneous Q24H   Continuous Infusions: . sodium chloride 50 mL/hr at 01/01/18 0654   PRN Meds:.acetaminophen **OR** acetaminophen (TYLENOL) oral liquid 160 mg/5 mL **OR** acetaminophen, hydrALAZINE, senna-docusate  Antibiotics  :   Anti-infectives (From admission, onward)   None          Objective:   Vitals:   01/01/18 0530 01/01/18 0545 01/01/18 0641 01/01/18 0841  BP: (!) 184/93 (!) 186/96 (!) 184/97 (!) 188/102  Pulse: 75 (!) 56 61 (!) 55  Resp: 15 15 17 19   Temp:    98.1 F (36.7 C) 98 F (36.7 C)  TempSrc:   Oral Oral  SpO2: 99% 98% 99% 99%  Weight:      Height:        Wt Readings from Last 3 Encounters:  01/01/18 127.5 kg  11/07/17 104.3 kg  11/02/17 127.7 kg    No intake or output data in the 24 hours ending 01/01/18 1052   Physical Exam  Awake Alert, Oriented X 3, left-sided hemiparesis along with left-sided nasolabial flattening, also has severe left-sided hemineglect  [place].AT,PERRAL Supple Neck,No JVD, No cervical lymphadenopathy appriciated.  Symmetrical Chest wall movement, Good air movement bilaterally, CTAB RRR,No Gallops,Rubs or new Murmurs, No Parasternal Heave +ve B.Sounds, Abd Soft, No tenderness, No organomegaly appriciated, No rebound - guarding or rigidity. No Cyanosis, Clubbing or edema, No new Rash or bruise      Data Review:    CBC Recent Labs  Lab 01/01/18 0308 01/01/18 0315  WBC 8.2  --   HGB 12.2* 12.6*  HCT 41.5 37.0*  PLT 279  --   MCV 89.1  --   MCH 26.2  --   MCHC 29.4*  --   RDW 16.2*  --   LYMPHSABS 3.2  --   MONOABS 0.9  --   EOSABS 0.3  --   BASOSABS 0.0  --     Chemistries  Recent Labs  Lab 01/01/18 0308 01/01/18 0315  NA 141 144  K 4.2 4.2  CL 112* 111  CO2 23  --   GLUCOSE 104* 103*  BUN 19 21  CREATININE 1.46* 1.60*  CALCIUM 9.7  --   AST 19  --   ALT 14  --   ALKPHOS 52  --   BILITOT 0.3  --    ------------------------------------------------------------------------------------------------------------------  Recent Labs    01/01/18 0308  CHOL 127  HDL 39*  LDLCALC 73  TRIG 73  CHOLHDL 3.3    Lab Results  Component Value Date   HGBA1C 5.8 (H) 01/01/2018   ------------------------------------------------------------------------------------------------------------------ No results for input(s): TSH, T4TOTAL, T3FREE, THYROIDAB in the last 72 hours.  Invalid input(s):  FREET3 ------------------------------------------------------------------------------------------------------------------ No results for input(s): VITAMINB12, FOLATE, FERRITIN, TIBC, IRON, RETICCTPCT in the last 72 hours.  Coagulation profile Recent Labs  Lab 01/01/18 0308  INR 1.06    No results for input(s): DDIMER in the last 72 hours.  Cardiac Enzymes No results for input(s): CKMB, TROPONINI, MYOGLOBIN in the last 168 hours.  Invalid input(s): CK ------------------------------------------------------------------------------------------------------------------    Component Value Date/Time   BNP 59.6 12/09/2016 1209    Micro Results No results found for this or any previous visit (from the past 240 hour(s)).  Radiology Reports Ct Angio Head W Or Wo Contrast  Result Date: 01/01/2018 CLINICAL DATA:  Follow up code stroke. LEFT-sided weakness. History of stroke, hypertension and hyperlipidemia. EXAM: CT ANGIOGRAPHY HEAD AND NECK CT PERFUSION BRAIN TECHNIQUE: Multidetector CT imaging of the head and neck was performed using the standard protocol during bolus administration of intravenous contrast. Multiplanar CT image reconstructions and MIPs were obtained to evaluate the vascular anatomy. Carotid stenosis measurements (when applicable) are obtained utilizing NASCET criteria, using the distal internal carotid diameter as the denominator. Multiphase CT imaging of the brain was performed following IV bolus contrast injection. Subsequent parametric perfusion maps were calculated using RAPID software. CONTRAST:  165mL ISOVUE-370 IOPAMIDOL (ISOVUE-370) INJECTION 76% COMPARISON:  CT HEAD January 01, 2018 FINDINGS: CTA NECK FINDINGS: AORTIC ARCH: Normal appearance of the thoracic arch, normal branch pattern. Mild calcific atherosclerosis aortic arch. The origins of the innominate, left Common carotid artery and subclavian artery are widely patent. RIGHT CAROTID SYSTEM: Common carotid artery is  patent. Calcific atherosclerosis resulting in less than 50% stenosis by NASCET criteria. Normal appearance of the internal carotid artery. LEFT CAROTID SYSTEM: Common carotid artery is patent. Mild calcific atherosclerosis of the carotid bifurcation without hemodynamically significant stenosis by NASCET criteria. Normal appearance of the internal carotid artery. VERTEBRAL ARTERIES:Left vertebral artery is dominant. Calcific atherosclerosis resulting in mild stenosis RIGHT vertebral artery. Degenerative changes cervical spine resultant mild extrinsic compression. SKELETON: No acute osseous process though bone windows have not been submitted. Status post median sternotomy. 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 Patient is edentulous. OTHER NECK: Soft tissues of the neck are nonacute though, not tailored for evaluation. UPPER CHEST: Included lung apices are clear. Mild centrilobular emphysema. No superior mediastinal lymphadenopathy. CTA HEAD FINDINGS: ANTERIOR CIRCULATION: Patent cervical internal carotid arteries, petrous, cavernous and supra clinoid internal carotid arteries. Patent anterior communicating artery. Patent anterior and middle cerebral arteries, moderate luminal irregularity compatible with atherosclerosis. No large vessel occlusion, significant stenosis, contrast extravasation or aneurysm. POSTERIOR CIRCULATION: Patent vertebral arteries, vertebrobasilar junction and basilar artery, as well as main branch vessels. Patent posterior cerebral arteries, moderate luminal irregularity compatible with atherosclerosis. No large vessel occlusion, significant stenosis, contrast extravasation or aneurysm. VENOUS SINUSES: Major dural venous sinuses are patent though not tailored for evaluation on this angiographic examination. ANATOMIC VARIANTS: None. DELAYED PHASE: Not performed. MIP images reviewed. CT Brain Perfusion Findings-motion degraded examination: CBF (<30%)  Volume: 24mL Perfusion (Tmax>6.0s) volume: 16mL Mismatch Volume: 63mL Infarction Location:RIGHT frontal lobe. IMPRESSION: CTA NECK: 1. No hemodynamically significant stenosis ICA's. 2. Mild stenosis RIGHT vertebral artery origin. CTA HEAD: 1. No emergent large  vessel occlusion. 2. Moderate cerebral artery atherosclerosis. CT PERFUSION: 1. Small acute RIGHT frontal/MCA penumbra though limited by motion artifact. Critical Value/emergent results text paged to Dr.ERIC [facility] via AM[employer] secure system on 01/01/2018 at 3:55 am, including interpreting physician's phone number. Aortic Atherosclerosis (ICD10-I70.0). Emphysema (ICD10-J43.9). Electronically Signed   By: Elon Alas M.D.   On: 01/01/2018 03:56   Ct Angio Neck W Or Wo Contrast  Result Date: 01/01/2018 CLINICAL DATA:  Follow up code stroke. LEFT-sided weakness. History of stroke, hypertension and hyperlipidemia. EXAM: CT ANGIOGRAPHY HEAD AND NECK CT PERFUSION BRAIN TECHNIQUE: Multidetector CT imaging of the head and neck was performed using the standard protocol during bolus administration of intravenous contrast. Multiplanar CT image reconstructions and MIPs were obtained to evaluate the vascular anatomy. Carotid stenosis measurements (when applicable) are obtained utilizing NASCET criteria, using the distal internal carotid diameter as the denominator. Multiphase CT imaging of the brain was performed following IV bolus contrast injection. Subsequent parametric perfusion maps were calculated using RAPID software. CONTRAST:  151mL ISOVUE-370 IOPAMIDOL (ISOVUE-370) INJECTION 76% COMPARISON:  CT HEAD January 01, 2018 FINDINGS: CTA NECK FINDINGS: AORTIC ARCH: Normal appearance of the thoracic arch, normal branch pattern. Mild calcific atherosclerosis aortic arch. The origins of the innominate, left Common carotid artery and subclavian artery are widely patent. RIGHT CAROTID SYSTEM: Common carotid artery is patent. Calcific atherosclerosis resulting in less than  50% stenosis by NASCET criteria. Normal appearance of the internal carotid artery. LEFT CAROTID SYSTEM: Common carotid artery is patent. Mild calcific atherosclerosis of the carotid bifurcation without hemodynamically significant stenosis by NASCET criteria. Normal appearance of the internal carotid artery. VERTEBRAL ARTERIES:Left vertebral artery is dominant. Calcific atherosclerosis resulting in mild stenosis RIGHT vertebral artery. Degenerative changes cervical spine resultant mild extrinsic compression. SKELETON: No acute osseous process though bone windows have not been submitted. Status post median sternotomy. 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 Patient is edentulous. OTHER NECK: Soft tissues of the neck are nonacute though, not tailored for evaluation. UPPER CHEST: Included lung apices are clear. Mild centrilobular emphysema. No superior mediastinal lymphadenopathy. CTA HEAD FINDINGS: ANTERIOR CIRCULATION: Patent cervical internal carotid arteries, petrous, cavernous and supra clinoid internal carotid arteries. Patent anterior communicating artery. Patent anterior and middle cerebral arteries, moderate luminal irregularity compatible with atherosclerosis. No large vessel occlusion, significant stenosis, contrast extravasation or aneurysm. POSTERIOR CIRCULATION: Patent vertebral arteries, vertebrobasilar junction and basilar artery, as well as main branch vessels. Patent posterior cerebral arteries, moderate luminal irregularity compatible with atherosclerosis. No large vessel occlusion, significant stenosis, contrast extravasation or aneurysm. VENOUS SINUSES: Major dural venous sinuses are patent though not tailored for evaluation on this angiographic examination. ANATOMIC VARIANTS: None. DELAYED PHASE: Not performed. MIP images reviewed. CT Brain Perfusion Findings-motion degraded examination: CBF (<30%) Volume: 62mL Perfusion (Tmax>6.0s) volume: 45mL Mismatch  Volume: 32mL Infarction Location:RIGHT frontal lobe. IMPRESSION: CTA NECK: 1. No hemodynamically significant stenosis ICA's. 2. Mild stenosis RIGHT vertebral artery origin. CTA HEAD: 1. No emergent large vessel occlusion. 2. Moderate cerebral artery atherosclerosis. CT PERFUSION: 1. Small acute RIGHT frontal/MCA penumbra though limited by motion artifact. Critical Value/emergent results text paged to Dr.ERIC [facility] via AMION secure system on 01/01/2018 at 3:55 am, including interpreting physician's phone number. Aortic Atherosclerosis (ICD10-I70.0). Emphysema (ICD10-J43.9). Electronically Signed   By: Elon Alas M.D.   On: 01/01/2018 03:56   Mr Brain Wo Contrast  Result Date: 01/01/2018 CLINICAL DATA:  Left-sided weakness. Focal neuro deficit of greater than 6 hours. EXAM: MRI HEAD WITHOUT CONTRAST TECHNIQUE:  Multiplanar, multiecho pulse sequences of the brain and surrounding structures were obtained without intravenous contrast. COMPARISON:  CT head without contrast 01/01/2018. CTA head and neck 01/01/2018. FINDINGS: Brain: The diffusion-weighted images demonstrate a confluent nonhemorrhagic infarct in the right frontal operculum. Additional scattered areas of nonhemorrhagic infarct are present over the right frontal and parietal lobe as well as the right occipital lobe. The pattern follows a watershed distribution. T2 signal changes are associated with the areas of restricted diffusion. Additional confluent periventricular T2 signal changes are present bilaterally. Ventricles are of normal size. No significant extra-axial fluid collection present. A remote hemorrhagic lacunar infarct is present the right thalamus or posterior limb internal capsule. There is a remote hemorrhage in the anterior left frontal lobe. Vascular: Flow is present in the major intracranial arteries. Skull and upper cervical spine: The craniocervical junction is normal. Upper cervical spine is unremarkable. Marrow signal is somewhat  depressed. This likely corresponds with anemia. Sinuses/Orbits: The paranasal sinuses and mastoid air cells are clear. Globes and orbits are within normal limits. IMPRESSION: 1. Acute/subacute right MCA territory infarcts involving the right frontal operculum an and diffusely along the right frontal and parietal lobes. The area along the convexity may be in a watershed distribution. 2. Additional confluent white matter changes are present bilaterally, likely reflecting the sequela of chronic microvascular ischemia. 3. Remote lacunar infarcts of the basal ganglia including a remote hemorrhagic infarct of the right thalamus. Electronically Signed   By: San Morelle M.D.   On: 01/01/2018 10:20   Ct C-spine No Charge  Result Date: 01/01/2018 CLINICAL DATA:  Mechanical fall. EXAM: CT CERVICAL SPINE WITHOUT CONTRAST TECHNIQUE: Reformatted CT imaging of the cervical spine was performed from CT angiogram head and neck. Multiplanar CT image reconstructions were also generated. COMPARISON:  None. FINDINGS: ALIGNMENT: Straightened lordosis.  Vertebral bodies in alignment. SKULL BASE AND VERTEBRAE: Cervical vertebral bodies and posterior elements are intact. Moderate C4-5, moderate to severe C6-7 disc height loss, mild at C5-6. Multilevel uncovertebral hypertrophy and endplate spurring. No destructive bony lesions. 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 Calcified craniocervical ligaments. C5-6 calcified ligamentum flavum. SOFT TISSUES AND SPINAL CANAL: Nonacute. Nuchal ligament calcification. DISC LEVELS: Moderate canal stenosis C5-6 and C6-7. Moderate RIGHT C4-5, RIGHT C5-6 and bilateral C6-7 neural foraminal narrowing. UPPER CHEST: Lung apices are clear. OTHER: None. IMPRESSION: 1. No fracture or malalignment. 2. Moderate canal stenosis C5-6 and C6-7. 3. Moderate C4-5 through C6-7 neural foraminal narrowing. Electronically Signed   By: Elon Alas M.D.   On: 01/01/2018 04:18   Ct Cerebral  Perfusion W Contrast  Result Date: 01/01/2018 CLINICAL DATA:  Follow up code stroke. LEFT-sided weakness. History of stroke, hypertension and hyperlipidemia. EXAM: CT ANGIOGRAPHY HEAD AND NECK CT PERFUSION BRAIN TECHNIQUE: Multidetector CT imaging of the head and neck was performed using the standard protocol during bolus administration of intravenous contrast. Multiplanar CT image reconstructions and MIPs were obtained to evaluate the vascular anatomy. Carotid stenosis measurements (when applicable) are obtained utilizing NASCET criteria, using the distal internal carotid diameter as the denominator. Multiphase CT imaging of the brain was performed following IV bolus contrast injection. Subsequent parametric perfusion maps were calculated using RAPID software. CONTRAST:  149mL ISOVUE-370 IOPAMIDOL (ISOVUE-370) INJECTION 76% COMPARISON:  CT HEAD January 01, 2018 FINDINGS: CTA NECK FINDINGS: AORTIC ARCH: Normal appearance of the thoracic arch, normal branch pattern. Mild calcific atherosclerosis aortic arch. The origins of the innominate, left Common carotid artery and subclavian artery are widely patent. RIGHT CAROTID SYSTEM: Common carotid artery  is patent. Calcific atherosclerosis resulting in less than 50% stenosis by NASCET criteria. Normal appearance of the internal carotid artery. LEFT CAROTID SYSTEM: Common carotid artery is patent. Mild calcific atherosclerosis of the carotid bifurcation without hemodynamically significant stenosis by NASCET criteria. Normal appearance of the internal carotid artery. VERTEBRAL ARTERIES:Left vertebral artery is dominant. Calcific atherosclerosis resulting in mild stenosis RIGHT vertebral artery. Degenerative changes cervical spine resultant mild extrinsic compression. SKELETON: No acute osseous process though bone windows have not been submitted. Status post median sternotomy. 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 Patient is  edentulous. OTHER NECK: Soft tissues of the neck are nonacute though, not tailored for evaluation. UPPER CHEST: Included lung apices are clear. Mild centrilobular emphysema. No superior mediastinal lymphadenopathy. CTA HEAD FINDINGS: ANTERIOR CIRCULATION: Patent cervical internal carotid arteries, petrous, cavernous and supra clinoid internal carotid arteries. Patent anterior communicating artery. Patent anterior and middle cerebral arteries, moderate luminal irregularity compatible with atherosclerosis. No large vessel occlusion, significant stenosis, contrast extravasation or aneurysm. POSTERIOR CIRCULATION: Patent vertebral arteries, vertebrobasilar junction and basilar artery, as well as main branch vessels. Patent posterior cerebral arteries, moderate luminal irregularity compatible with atherosclerosis. No large vessel occlusion, significant stenosis, contrast extravasation or aneurysm. VENOUS SINUSES: Major dural venous sinuses are patent though not tailored for evaluation on this angiographic examination. ANATOMIC VARIANTS: None. DELAYED PHASE: Not performed. MIP images reviewed. CT Brain Perfusion Findings-motion degraded examination: CBF (<30%) Volume: 69mL Perfusion (Tmax>6.0s) volume: 41mL Mismatch Volume: 93mL Infarction Location:RIGHT frontal lobe. IMPRESSION: CTA NECK: 1. No hemodynamically significant stenosis ICA's. 2. Mild stenosis RIGHT vertebral artery origin. CTA HEAD: 1. No emergent large vessel occlusion. 2. Moderate cerebral artery atherosclerosis. CT PERFUSION: 1. Small acute RIGHT frontal/MCA penumbra though limited by motion artifact. Critical Value/emergent results text paged to Dr.ERIC [facility] via AMION secure system on 01/01/2018 at 3:55 am, including interpreting physician's phone number. Aortic Atherosclerosis (ICD10-I70.0). Emphysema (ICD10-J43.9). Electronically Signed   By: Elon Alas M.D.   On: 01/01/2018 03:56   Ct Head Code Stroke Wo Contrast  Result Date:  01/01/2018 CLINICAL DATA:  Code stroke. LEFT-sided weakness. History of stroke, hypertension and hyperlipidemia. EXAM: CT HEAD WITHOUT CONTRAST TECHNIQUE: Contiguous axial images were obtained from the base of the skull through the vertex without intravenous contrast. COMPARISON:  MRI head March 17, 2013 FINDINGS: BRAIN: No intraparenchymal hemorrhage, mass effect nor midline shift. Old RIGHT cerebellar infarcts. Confluent pontine and supratentorial white matter hypodensities. Old RIGHT basal ganglia infarcts with ex vacuo dilatation RIGHT frontal horn of the lateral ventricle. No acute large vascular territory infarcts. No abnormal extra-axial fluid collections. Basal cisterns are patent. VASCULAR: Moderate calcific atherosclerosis of the carotid siphons. SKULL: No skull fracture. No significant scalp soft tissue swelling. SINUSES/ORBITS: Mild paranasal sinus mucosal thickening. Mastoid air cells are well aerated.LEFT buphthalmos and ocular lens implant. OTHER: Patient is edentulous. ASPECTS [facility] Stroke Program Early CT Score) - Ganglionic level infarction (caudate, lentiform nuclei, internal capsule, insula, M1-M3 cortex): 7 - Supraganglionic infarction (M4-M6 cortex): 3 Total score (0-10 with 10 being normal): 10 IMPRESSION: 1. No acute intracranial process. 2. ASPECTS is 10. 3. Moderate to severe chronic small vessel ischemic changes. 4. 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 5. Critical Value/emergent results text paged to [place], Neurology via [place] secure system on 01/01/2018 at 3:27 am, including interpreting physician's phone number. Electronically Signed   By: Elon Alas M.D.   On: 01/01/2018 03:27    Time Spent in minutes  [place]  M.D on 01/01/2018 at 10:52 AM  To page go to [email or web address] - password [facility]

## 2018-01-02 ENCOUNTER — Inpatient Hospital Stay (HOSPITAL_COMMUNITY): Payer: Medicare Other

## 2018-01-02 DIAGNOSIS — I639 Cerebral infarction, unspecified: Secondary | ICD-10-CM

## 2018-01-02 DIAGNOSIS — I5030 Unspecified diastolic (congestive) heart failure: Secondary | ICD-10-CM

## 2018-01-02 LAB — BASIC METABOLIC PANEL
ANION GAP: 8 (ref 5–15)
BUN: 14 mg/dL (ref 8–23)
CHLORIDE: 110 mmol/L (ref 98–111)
CO2: 23 mmol/L (ref 22–32)
Calcium: 9.7 mg/dL (ref 8.9–10.3)
Creatinine, Ser: 1.3 mg/dL — ABNORMAL HIGH (ref 0.61–1.24)
GFR calc non Af Amer: 52 mL/min — ABNORMAL LOW (ref >60)
Glucose, Bld: 119 mg/dL — ABNORMAL HIGH (ref 70–99)
Potassium: 3.6 mmol/L (ref 3.5–5.1)
SODIUM: 141 mmol/L (ref 135–145)

## 2018-01-02 LAB — GLUCOSE, CAPILLARY
Glucose-Capillary: 105 mg/dL — ABNORMAL HIGH (ref 70–99)
Glucose-Capillary: 113 mg/dL — ABNORMAL HIGH (ref 70–99)
Glucose-Capillary: 115 mg/dL — ABNORMAL HIGH (ref 70–99)

## 2018-01-02 LAB — ECHOCARDIOGRAM COMPLETE
Height: 74 in
Weight: 4497.38 [oz_av]

## 2018-01-02 LAB — MAGNESIUM: Magnesium: 2.1 mg/dL (ref 1.7–2.4)

## 2018-01-02 LAB — CBC
HCT: 38.4 % — ABNORMAL LOW (ref 39.0–52.0)
Hemoglobin: 12.3 g/dL — ABNORMAL LOW (ref 13.0–17.0)
MCH: 26.9 pg (ref 26.0–34.0)
MCHC: 32 g/dL (ref 30.0–36.0)
MCV: 84 fL (ref 80.0–100.0)
Platelets: 279 K/uL (ref 150–400)
RBC: 4.57 MIL/uL (ref 4.22–5.81)
RDW: 15.9 % — ABNORMAL HIGH (ref 11.5–15.5)
WBC: 9.6 K/uL (ref 4.0–10.5)
nRBC: 0 % (ref 0.0–0.2)

## 2018-01-02 MED ORDER — ASPIRIN EC 81 MG PO TBEC
81.0000 mg | DELAYED_RELEASE_TABLET | Freq: Every day | ORAL | Status: DC
  Administered 2018-01-03 – 2018-01-05 (×3): 81 mg via ORAL
  Filled 2018-01-02 (×4): qty 1

## 2018-01-02 MED ORDER — CLOPIDOGREL BISULFATE 75 MG PO TABS
75.0000 mg | ORAL_TABLET | Freq: Every day | ORAL | Status: DC
  Administered 2018-01-03 – 2018-01-05 (×3): 75 mg via ORAL
  Filled 2018-01-02 (×3): qty 1

## 2018-01-02 MED ORDER — PERFLUTREN LIPID MICROSPHERE
1.0000 mL | INTRAVENOUS | Status: AC | PRN
  Administered 2018-01-02: 2 mL via INTRAVENOUS
  Filled 2018-01-02: qty 10

## 2018-01-02 MED ORDER — METOPROLOL TARTRATE 25 MG PO TABS
25.0000 mg | ORAL_TABLET | Freq: Two times a day (BID) | ORAL | Status: DC
  Administered 2018-01-02 – 2018-01-05 (×6): 25 mg via ORAL
  Filled 2018-01-02 (×6): qty 1

## 2018-01-02 MED ORDER — RESOURCE THICKENUP CLEAR PO POWD
ORAL | Status: DC | PRN
  Filled 2018-01-02: qty 125

## 2018-01-02 MED ORDER — ROSUVASTATIN CALCIUM 5 MG PO TABS: 10.0000 mg | ORAL_TABLET | Freq: Every day | ORAL | Status: DC

## 2018-01-02 MED ORDER — SODIUM CHLORIDE 0.9 % IV SOLN
INTRAVENOUS | Status: DC
  Administered 2018-01-02: 11:00:00 via INTRAVENOUS

## 2018-01-02 MED ORDER — ROSUVASTATIN CALCIUM 20 MG PO TABS
20.0000 mg | ORAL_TABLET | Freq: Every day | ORAL | Status: DC
  Administered 2018-01-02 – 2018-01-04 (×3): 20 mg via ORAL
  Filled 2018-01-02 (×3): qty 1

## 2018-01-02 NOTE — Progress Notes (Signed)
PROGRESS NOTE                                                                                                                                                                                                             Patient Demographics:    [name], is a 77 y.o. male, DOB - Feb 10, 1941, HDQ:[id number]  Admit date - 01/01/2018   Admitting Physician Rise Patience, MD  Outpatient Primary MD for the patient is Rogers Blocker, MD  LOS - 1  Chief Complaint  Patient presents with  . Code Stroke       Brief Narrative  [name] is a 77 y.o. male with history of CAD status post CABG, hypertension, chronic kidney disease, hyperlipidemia, sleep apnea was found on the floor by patient's wife around 2 AM this morning in the bathroom.  Patient's wife woke up after she heard a sound.  On exam patient was having left facial drooping dysarthria and left-sided weakness.  He was diagnosed with CVA and admitted to the hospital.   Subjective:   Patient in bed, appears comfortable, denies any headache, no fever, no chest pain or pressure, no shortness of breath , no abdominal pain.  Have continued left-sided weakness but is not fully aware of it.   Assessment  & Plan :     1.  Left-sided weakness, dysphagia, dysarthria due to acute right MCA territory infarcts noted on MRI.  CTA head and neck unremarkable, full stroke work-up underway, currently on rectal aspirin, resume home dose statin and niacin once taking orally, neuro on board.  Will complete stroke work-up including speech evaluation and adjust medications as needed.  May require placement.  He has failed his first speech swallow and is currently n.p.o., due for modified barium swallow later on 01/02/2018, echo pending.  Lab Results  Component Value Date   HGBA1C 5.8 (H) 01/01/2018   Lab Results  Component Value Date   CHOL 127 01/01/2018   HDL 39 (L) 01/01/2018   LDLCALC 73 01/01/2018   TRIG 73 01/01/2018   CHOLHDL  3.3 01/01/2018     2.  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  Creatinine close to his baseline of 1.4.  Proving with gentle hydration while n.p.o.  3.  Essential hypertension.  Allow for permissive hypertension for stroke.  PRN hydralazine with parameters.  4.  CAD.  No acute issue.  Continue aspirin and statin for now, resume beta-blocker once blood pressure requirements have eased.  5.  Dyslipidemia.  Resume home dose statin and niacin once taking oral.  6.  GERD.  On PPI.  7. HX Gout. Resume allopurinol once taking orally.    Family Communication  :  None  Code Status :  Full  Disposition Plan  :  TBD  Consults  :  Neuro  Procedures  :    MRI - 1. Acute/subacute right MCA territory infarcts involving the right frontal operculum an and diffusely along the right frontal and parietal lobes. The area along the convexity may be in a watershed distribution. 2. Additional confluent white matter changes are present bilaterally, likely reflecting the sequela of chronic microvascular ischemia. 3. Remote lacunar infarcts of the basal ganglia including a remote hemorrhagic infarct of the right thalamus  TTE  CTA -  CTA NECK: 1. No hemodynamically significant stenosis ICA's. 2. Mild stenosis RIGHT vertebral artery origin. CTA HEAD: 1. No emergent large vessel occlusion. 2. Moderate cerebral artery atherosclerosis. CT PERFUSION: 1. Small acute RIGHT frontal/MCA penumbra though limited by motion artifact  DVT Prophylaxis  :  Lovenox   Lab Results  Component Value Date   PLT 279 01/02/2018    Diet :  Diet Order            Diet NPO time specified  Diet effective now               Inpatient Medications Scheduled Meds: . allopurinol  200 mg Oral Daily  . aspirin  300 mg Rectal Daily   Or  . aspirin  325 mg Oral Daily  . enoxaparin (LOVENOX) injection  40 mg Subcutaneous Q24H  . niacin  500 mg Oral QHS  . pantoprazole  40 mg Oral Daily  . pravastatin  40 mg Oral q1800   Continuous  Infusions: . sodium chloride     PRN Meds:.acetaminophen **OR** acetaminophen (TYLENOL) oral liquid 160 mg/5 mL **OR** acetaminophen, hydrALAZINE, senna-docusate  Antibiotics  :   Anti-infectives (From admission, onward)   None          Objective:   Vitals:   01/02/18 0422 01/02/18 0559 01/02/18 0724 01/02/18 0758  BP: (!) 184/116 (!) 182/99 (!) 202/112 (!) 188/105  Pulse:  87 63 62  Resp: (!) 21 (!) 22 16   Temp: 98.4 F (36.9 C)  98.8 F (37.1 C)   TempSrc: Oral  Oral   SpO2: 97% 96% 99% 100%  Weight:      Height:        Wt Readings from Last 3 Encounters:  01/01/18 127.5 kg  11/07/17 104.3 kg  11/02/17 127.7 kg     Intake/Output Summary (Last 24 hours) at 01/02/2018 0904 Last data filed at 01/02/2018 0421 Gross per 24 hour  Intake 794.69 ml  Output 3050 ml  Net -2255.31 ml     Physical Exam  Awake Alert, left arm 0-1/5, left leg 3/5, right-sided extremities 5/5, does have left-sided neglect and left-sided nasolabial flattening Arrey.AT,PERRAL Supple Neck,No JVD, No cervical lymphadenopathy appriciated.  Symmetrical Chest wall movement, Good air movement bilaterally, CTAB RRR,No Gallops, Rubs or new Murmurs, No Parasternal Heave +ve B.Sounds, Abd Soft, No tenderness, No organomegaly appriciated, No rebound - guarding or rigidity. No Cyanosis, Clubbing or edema, No new Rash or bruise      Data Review:    CBC Recent Labs  Lab 01/01/18 0308 01/01/18 0315 01/02/18 0449  WBC 8.2  --  9.6  HGB 12.2* 12.6* 12.3*  HCT 41.5 37.0* 38.4*  PLT 279  --  279  MCV 89.1  --  84.0  MCH 26.2  --  26.9  MCHC 29.4*  --  32.0  RDW 16.2*  --  15.9*  LYMPHSABS 3.2  --   --   MONOABS 0.9  --   --   EOSABS 0.3  --   --   BASOSABS 0.0  --   --     Chemistries  Recent Labs  Lab 01/01/18 0308 01/01/18 0315 01/02/18 0614  NA 141 144 141  K 4.2 4.2 3.6  CL 112* 111 110  CO2 23  --  23  GLUCOSE 104* 103* 119*  BUN 19 21 14   CREATININE 1.46* 1.60* 1.30*   CALCIUM 9.7  --  9.7  MG  --   --  2.1  AST 19  --   --   ALT 14  --   --   ALKPHOS 52  --   --   BILITOT 0.3  --   --    ------------------------------------------------------------------------------------------------------------------ Recent Labs    01/01/18 0308  CHOL 127  HDL 39*  LDLCALC 73  TRIG 73  CHOLHDL 3.3    Lab Results  Component Value Date   HGBA1C 5.8 (H) 01/01/2018   ------------------------------------------------------------------------------------------------------------------ No results for input(s): TSH, T4TOTAL, T3FREE, THYROIDAB in the last 72 hours.  Invalid input(s): FREET3 ------------------------------------------------------------------------------------------------------------------ No results for input(s): VITAMINB12, FOLATE, FERRITIN, TIBC, IRON, RETICCTPCT in the last 72 hours.  Coagulation profile Recent Labs  Lab 01/01/18 0308  INR 1.06    No results for input(s): DDIMER in the last 72 hours.  Cardiac Enzymes No results for input(s): CKMB, TROPONINI, MYOGLOBIN in the last 168 hours.  Invalid input(s): CK ------------------------------------------------------------------------------------------------------------------    Component Value Date/Time   BNP 59.6 12/09/2016 1209    Micro Results No results found for this or any previous visit (from the past 240 hour(s)).  Radiology Reports Ct Angio Head W Or Wo Contrast  Result Date: 01/01/2018 CLINICAL DATA:  Follow up code stroke. LEFT-sided weakness. History of stroke, hypertension and hyperlipidemia. EXAM: CT ANGIOGRAPHY HEAD AND NECK CT PERFUSION BRAIN TECHNIQUE: Multidetector CT imaging of the head and neck was performed using the standard protocol during bolus administration of intravenous contrast. Multiplanar CT image reconstructions and MIPs were obtained to evaluate the vascular anatomy. Carotid stenosis measurements (when applicable) are obtained utilizing NASCET criteria,  using the distal internal carotid diameter as the denominator. Multiphase CT imaging of the brain was performed following IV bolus contrast injection. Subsequent parametric perfusion maps were calculated using RAPID software. CONTRAST:  165mL ISOVUE-370 IOPAMIDOL (ISOVUE-370) INJECTION 76% COMPARISON:  CT HEAD January 01, 2018 FINDINGS: CTA NECK FINDINGS: AORTIC ARCH: Normal appearance of the thoracic arch, normal branch pattern. Mild calcific atherosclerosis aortic arch. The origins of the innominate, left Common carotid artery and subclavian artery are widely patent. RIGHT CAROTID SYSTEM: Common carotid artery is patent. Calcific atherosclerosis resulting in less than 50% stenosis by NASCET criteria. Normal appearance of the internal carotid artery. LEFT CAROTID SYSTEM: Common carotid artery is patent. Mild calcific atherosclerosis of the carotid bifurcation without hemodynamically significant stenosis by NASCET criteria. Normal appearance of the internal carotid artery. VERTEBRAL ARTERIES:Left vertebral artery is dominant. Calcific atherosclerosis resulting in mild stenosis RIGHT vertebral artery. Degenerative changes cervical spine resultant mild extrinsic compression. SKELETON: No acute osseous process though bone windows have not been submitted. Status post median sternotomy. 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 Patient is edentulous. OTHER NECK: Soft tissues of the neck are nonacute  though, not tailored for evaluation. UPPER CHEST: Included lung apices are clear. Mild centrilobular emphysema. No superior mediastinal lymphadenopathy. CTA HEAD FINDINGS: ANTERIOR CIRCULATION: Patent cervical internal carotid arteries, petrous, cavernous and supra clinoid internal carotid arteries. Patent anterior communicating artery. Patent anterior and middle cerebral arteries, moderate luminal irregularity compatible with atherosclerosis. No large vessel occlusion, significant  stenosis, contrast extravasation or aneurysm. POSTERIOR CIRCULATION: Patent vertebral arteries, vertebrobasilar junction and basilar artery, as well as main branch vessels. Patent posterior cerebral arteries, moderate luminal irregularity compatible with atherosclerosis. No large vessel occlusion, significant stenosis, contrast extravasation or aneurysm. VENOUS SINUSES: Major dural venous sinuses are patent though not tailored for evaluation on this angiographic examination. ANATOMIC VARIANTS: None. DELAYED PHASE: Not performed. MIP images reviewed. CT Brain Perfusion Findings-motion degraded examination: CBF (<30%) Volume: 62mL Perfusion (Tmax>6.0s) volume: 64mL Mismatch Volume: 6mL Infarction Location:RIGHT frontal lobe. IMPRESSION: CTA NECK: 1. No hemodynamically significant stenosis ICA's. 2. Mild stenosis RIGHT vertebral artery origin. CTA HEAD: 1. No emergent large vessel occlusion. 2. Moderate cerebral artery atherosclerosis. CT PERFUSION: 1. Small acute RIGHT frontal/MCA penumbra though limited by motion artifact. Critical Value/emergent results text paged to Dr.ERIC [facility] via AMION secure system on 01/01/2018 at 3:55 am, including interpreting physician's phone number. Aortic Atherosclerosis (ICD10-I70.0). Emphysema (ICD10-J43.9). Electronically Signed   By: Elon Alas M.D.   On: 01/01/2018 03:56   Ct Angio Neck W Or Wo Contrast  Result Date: 01/01/2018 CLINICAL DATA:  Follow up code stroke. LEFT-sided weakness. History of stroke, hypertension and hyperlipidemia. EXAM: CT ANGIOGRAPHY HEAD AND NECK CT PERFUSION BRAIN TECHNIQUE: Multidetector CT imaging of the head and neck was performed using the standard protocol during bolus administration of intravenous contrast. Multiplanar CT image reconstructions and MIPs were obtained to evaluate the vascular anatomy. Carotid stenosis measurements (when applicable) are obtained utilizing NASCET criteria, using the distal internal carotid diameter as the  denominator. Multiphase CT imaging of the brain was performed following IV bolus contrast injection. Subsequent parametric perfusion maps were calculated using RAPID software. CONTRAST:  13mL ISOVUE-370 IOPAMIDOL (ISOVUE-370) INJECTION 76% COMPARISON:  CT HEAD January 01, 2018 FINDINGS: CTA NECK FINDINGS: AORTIC ARCH: Normal appearance of the thoracic arch, normal branch pattern. Mild calcific atherosclerosis aortic arch. The origins of the innominate, left Common carotid artery and subclavian artery are widely patent. RIGHT CAROTID SYSTEM: Common carotid artery is patent. Calcific atherosclerosis resulting in less than 50% stenosis by NASCET criteria. Normal appearance of the internal carotid artery. LEFT CAROTID SYSTEM: Common carotid artery is patent. Mild calcific atherosclerosis of the carotid bifurcation without hemodynamically significant stenosis by NASCET criteria. Normal appearance of the internal carotid artery. VERTEBRAL ARTERIES:Left vertebral artery is dominant. Calcific atherosclerosis resulting in mild stenosis RIGHT vertebral artery. Degenerative changes cervical spine resultant mild extrinsic compression. SKELETON: No acute osseous process though bone windows have not been submitted. Status post median sternotomy. 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 Patient is edentulous. OTHER NECK: Soft tissues of the neck are nonacute though, not tailored for evaluation. UPPER CHEST: Included lung apices are clear. Mild centrilobular emphysema. No superior mediastinal lymphadenopathy. CTA HEAD FINDINGS: ANTERIOR CIRCULATION: Patent cervical internal carotid arteries, petrous, cavernous and supra clinoid internal carotid arteries. Patent anterior communicating artery. Patent anterior and middle cerebral arteries, moderate luminal irregularity compatible with atherosclerosis. No large vessel occlusion, significant stenosis, contrast extravasation or aneurysm.  POSTERIOR CIRCULATION: Patent vertebral arteries, vertebrobasilar junction and basilar artery, as well as main branch vessels. Patent posterior cerebral arteries, moderate luminal irregularity compatible with atherosclerosis.  No large vessel occlusion, significant stenosis, contrast extravasation or aneurysm. VENOUS SINUSES: Major dural venous sinuses are patent though not tailored for evaluation on this angiographic examination. ANATOMIC VARIANTS: None. DELAYED PHASE: Not performed. MIP images reviewed. CT Brain Perfusion Findings-motion degraded examination: CBF (<30%) Volume: 85mL Perfusion (Tmax>6.0s) volume: 30mL Mismatch Volume: 38mL Infarction Location:RIGHT frontal lobe. IMPRESSION: CTA NECK: 1. No hemodynamically significant stenosis ICA's. 2. Mild stenosis RIGHT vertebral artery origin. CTA HEAD: 1. No emergent large vessel occlusion. 2. Moderate cerebral artery atherosclerosis. CT PERFUSION: 1. Small acute RIGHT frontal/MCA penumbra though limited by motion artifact. Critical Value/emergent results text paged to Dr.ERIC [facility] via AMION secure system on 01/01/2018 at 3:55 am, including interpreting physician's phone number. Aortic Atherosclerosis (ICD10-I70.0). Emphysema (ICD10-J43.9). Electronically Signed   By: Elon Alas M.D.   On: 01/01/2018 03:56   Mr Brain Wo Contrast  Result Date: 01/01/2018 CLINICAL DATA:  Left-sided weakness. Focal neuro deficit of greater than 6 hours. EXAM: MRI HEAD WITHOUT CONTRAST TECHNIQUE: Multiplanar, multiecho pulse sequences of the brain and surrounding structures were obtained without intravenous contrast. COMPARISON:  CT head without contrast 01/01/2018. CTA head and neck 01/01/2018. FINDINGS: Brain: The diffusion-weighted images demonstrate a confluent nonhemorrhagic infarct in the right frontal operculum. Additional scattered areas of nonhemorrhagic infarct are present over the right frontal and parietal lobe as well as the right occipital lobe. The pattern  follows a watershed distribution. T2 signal changes are associated with the areas of restricted diffusion. Additional confluent periventricular T2 signal changes are present bilaterally. Ventricles are of normal size. No significant extra-axial fluid collection present. A remote hemorrhagic lacunar infarct is present the right thalamus or posterior limb internal capsule. There is a remote hemorrhage in the anterior left frontal lobe. Vascular: Flow is present in the major intracranial arteries. Skull and upper cervical spine: The craniocervical junction is normal. Upper cervical spine is unremarkable. Marrow signal is somewhat depressed. This likely corresponds with anemia. Sinuses/Orbits: The paranasal sinuses and mastoid air cells are clear. Globes and orbits are within normal limits. IMPRESSION: 1. Acute/subacute right MCA territory infarcts involving the right frontal operculum an and diffusely along the right frontal and parietal lobes. The area along the convexity may be in a watershed distribution. 2. Additional confluent white matter changes are present bilaterally, likely reflecting the sequela of chronic microvascular ischemia. 3. Remote lacunar infarcts of the basal ganglia including a remote hemorrhagic infarct of the right thalamus. Electronically Signed   By: San Morelle M.D.   On: 01/01/2018 10:20   Ct C-spine No Charge  Result Date: 01/01/2018 CLINICAL DATA:  Mechanical fall. EXAM: CT CERVICAL SPINE WITHOUT CONTRAST TECHNIQUE: Reformatted CT imaging of the cervical spine was performed from CT angiogram head and neck. Multiplanar CT image reconstructions were also generated. COMPARISON:  None. FINDINGS: ALIGNMENT: Straightened lordosis.  Vertebral bodies in alignment. SKULL BASE AND VERTEBRAE: Cervical vertebral bodies and posterior elements are intact. Moderate C4-5, moderate to severe C6-7 disc height loss, mild at C5-6. Multilevel uncovertebral hypertrophy and endplate spurring. No  destructive bony lesions. 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 Calcified craniocervical ligaments. C5-6 calcified ligamentum flavum. SOFT TISSUES AND SPINAL CANAL: Nonacute. Nuchal ligament calcification. DISC LEVELS: Moderate canal stenosis C5-6 and C6-7. Moderate RIGHT C4-5, RIGHT C5-6 and bilateral C6-7 neural foraminal narrowing. UPPER CHEST: Lung apices are clear. OTHER: None. IMPRESSION: 1. No fracture or malalignment. 2. Moderate canal stenosis C5-6 and C6-7. 3. Moderate C4-5 through C6-7 neural foraminal narrowing. Electronically Signed   By: Thana Farr.D.  On: 01/01/2018 04:18   Ct Cerebral Perfusion W Contrast  Result Date: 01/01/2018 CLINICAL DATA:  Follow up code stroke. LEFT-sided weakness. History of stroke, hypertension and hyperlipidemia. EXAM: CT ANGIOGRAPHY HEAD AND NECK CT PERFUSION BRAIN TECHNIQUE: Multidetector CT imaging of the head and neck was performed using the standard protocol during bolus administration of intravenous contrast. Multiplanar CT image reconstructions and MIPs were obtained to evaluate the vascular anatomy. Carotid stenosis measurements (when applicable) are obtained utilizing NASCET criteria, using the distal internal carotid diameter as the denominator. Multiphase CT imaging of the brain was performed following IV bolus contrast injection. Subsequent parametric perfusion maps were calculated using RAPID software. CONTRAST:  119mL ISOVUE-370 IOPAMIDOL (ISOVUE-370) INJECTION 76% COMPARISON:  CT HEAD January 01, 2018 FINDINGS: CTA NECK FINDINGS: AORTIC ARCH: Normal appearance of the thoracic arch, normal branch pattern. Mild calcific atherosclerosis aortic arch. The origins of the innominate, left Common carotid artery and subclavian artery are widely patent. RIGHT CAROTID SYSTEM: Common carotid artery is patent. Calcific atherosclerosis resulting in less than 50% stenosis by NASCET criteria. Normal appearance of the internal carotid artery. LEFT  CAROTID SYSTEM: Common carotid artery is patent. Mild calcific atherosclerosis of the carotid bifurcation without hemodynamically significant stenosis by NASCET criteria. Normal appearance of the internal carotid artery. VERTEBRAL ARTERIES:Left vertebral artery is dominant. Calcific atherosclerosis resulting in mild stenosis RIGHT vertebral artery. Degenerative changes cervical spine resultant mild extrinsic compression. SKELETON: No acute osseous process though bone windows have not been submitted. Status post median sternotomy. 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 Patient is edentulous. OTHER NECK: Soft tissues of the neck are nonacute though, not tailored for evaluation. UPPER CHEST: Included lung apices are clear. Mild centrilobular emphysema. No superior mediastinal lymphadenopathy. CTA HEAD FINDINGS: ANTERIOR CIRCULATION: Patent cervical internal carotid arteries, petrous, cavernous and supra clinoid internal carotid arteries. Patent anterior communicating artery. Patent anterior and middle cerebral arteries, moderate luminal irregularity compatible with atherosclerosis. No large vessel occlusion, significant stenosis, contrast extravasation or aneurysm. POSTERIOR CIRCULATION: Patent vertebral arteries, vertebrobasilar junction and basilar artery, as well as main branch vessels. Patent posterior cerebral arteries, moderate luminal irregularity compatible with atherosclerosis. No large vessel occlusion, significant stenosis, contrast extravasation or aneurysm. VENOUS SINUSES: Major dural venous sinuses are patent though not tailored for evaluation on this angiographic examination. ANATOMIC VARIANTS: None. DELAYED PHASE: Not performed. MIP images reviewed. CT Brain Perfusion Findings-motion degraded examination: CBF (<30%) Volume: 58mL Perfusion (Tmax>6.0s) volume: 63mL Mismatch Volume: 33mL Infarction Location:RIGHT frontal lobe. IMPRESSION: CTA NECK: 1. No  hemodynamically significant stenosis ICA's. 2. Mild stenosis RIGHT vertebral artery origin. CTA HEAD: 1. No emergent large vessel occlusion. 2. Moderate cerebral artery atherosclerosis. CT PERFUSION: 1. Small acute RIGHT frontal/MCA penumbra though limited by motion artifact. Critical Value/emergent results text paged to Dr.ERIC [facility] via AMION secure system on 01/01/2018 at 3:55 am, including interpreting physician's phone number. Aortic Atherosclerosis (ICD10-I70.0). Emphysema (ICD10-J43.9). Electronically Signed   By: Elon Alas M.D.   On: 01/01/2018 03:56   Ct Head Code Stroke Wo Contrast  Result Date: 01/01/2018 CLINICAL DATA:  Code stroke. LEFT-sided weakness. History of stroke, hypertension and hyperlipidemia. EXAM: CT HEAD WITHOUT CONTRAST TECHNIQUE: Contiguous axial images were obtained from the base of the skull through the vertex without intravenous contrast. COMPARISON:  MRI head March 17, 2013 FINDINGS: BRAIN: No intraparenchymal hemorrhage, mass effect nor midline shift. Old RIGHT cerebellar infarcts. Confluent pontine and supratentorial white matter hypodensities. Old RIGHT basal ganglia infarcts with ex vacuo dilatation RIGHT frontal horn of  the lateral ventricle. No acute large vascular territory infarcts. No abnormal extra-axial fluid collections. Basal cisterns are patent. VASCULAR: Moderate calcific atherosclerosis of the carotid siphons. SKULL: No skull fracture. No significant scalp soft tissue swelling. SINUSES/ORBITS: Mild paranasal sinus mucosal thickening. Mastoid air cells are well aerated.LEFT buphthalmos and ocular lens implant. OTHER: Patient is edentulous. ASPECTS [facility] Stroke Program Early CT Score) - Ganglionic level infarction (caudate, lentiform nuclei, internal capsule, insula, M1-M3 cortex): 7 - Supraganglionic infarction (M4-M6 cortex): 3 Total score (0-10 with 10 being normal): 10 IMPRESSION: 1. No acute intracranial process. 2. ASPECTS is 10. 3. Moderate to  severe chronic small vessel ischemic changes. 4. 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 5. Critical Value/emergent results text paged to [place], Neurology via [place] secure system on 01/01/2018 at 3:27 am, including interpreting physician's phone number. Electronically Signed   By: Elon Alas M.D.   On: 01/01/2018 03:27    Time Spent in minutes  30   Lala Lund M.D on 01/02/2018 at 9:04 AM  To page go to [email or web address] - password [email or web address]

## 2018-01-02 NOTE — Progress Notes (Signed)
  Echocardiogram 2D Echocardiogram has been performed.  [name] 01/02/2018, 12:45 PM

## 2018-01-02 NOTE — Progress Notes (Signed)
VASCULAR LAB PRELIMINARY  PRELIMINARY  PRELIMINARY  PRELIMINARY  Bilateral lower extremity venous duplex completed.    Preliminary report:  There is no obvious evidence of DVT or SVT noted in the bilateral lower extremities.   Chigozie Basaldua, RVT 01/02/2018, 1:08 PM

## 2018-01-02 NOTE — Evaluation (Signed)
Occupational Therapy Evaluation Patient Details Name: [name] MRN: [id number] DOB: [birth year] Today's Date: 01/02/2018    History of Present Illness Pt is a 77 y.o. male admitted 01/01/18 after being found down at home by wife, noted to have L facial droop, L-side weakness and dysarthria. 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 PMH includes HTN, CVA (2000), OSA, DJD, cataracts, CAD.   Clinical Impression   PT admitted with R MCA with remote basal ganglia and R thalamas. Pt currently with functional limitiations due to the deficits listed below (see OT problem list). PT noted to have visual changes and L inattention. Pt with elevated HR sustained 147 during session. Pt requires total +2 max (A) but able to take several steps with max cueing. Pt shows activation of L UE and could possibly benefit from L platform RW next session with therapist facilitation of L UE positioning ( cues to adduct arm)  Pt will benefit from skilled OT to increase their independence and safety with adls and balance to allow discharge CIR.     Follow Up Recommendations  CIR    Equipment Recommendations  3 in 1 bedside commode;Other (comment)(platform walker L side possibly)    Recommendations for Other Services Rehab consult     Precautions / Restrictions Precautions Precautions: Fall Restrictions Weight Bearing Restrictions: No      Mobility Bed Mobility Overal bed mobility: Needs Assistance Bed Mobility: Supine to Sit     Supine to sit: Mod assist     General bed mobility comments: When asked to sit EOB, pt initiating towards R-side, but able to come to L-side with cues; modA to assist trunk elevation, pt not actively engaging LUE, attempting to use momentum  Transfers Overall transfer level: Needs assistance Equipment used: 2 person hand held assist Transfers: Sit to/from Stand Sit to Stand: Max  assist;+2 physical assistance         General transfer comment: Pt reliant on momentum and maxA+2 to stand; maxA to hold LUE as pt not attending to it. Self-corrected L knee instability upon standing    Balance Overall balance assessment: Needs assistance Sitting-balance support: Feet supported Sitting balance-Leahy Scale: Fair Sitting balance - Comments: pt using R side to help sustain task     Standing balance-Leahy Scale: Poor                             ADL either performed or assessed with clinical judgement   ADL Overall ADL's : Needs assistance/impaired Eating/Feeding: Moderate assistance Eating/Feeding Details (indicate cue type and reason): pt with nectar thick liquid recommendation and tray delivered with thins. RN called and SLP called regarding diet order. Pt provided correct nectar thick by RN. Pt provided tsp cola during session with therapist present.  Grooming: Moderate assistance;Wash/dry face;Sitting Grooming Details (indicate cue type and reason): pt needs cues to wipe L side and attention to detail. pt with decr sensory input to L side present Upper Body Bathing: Moderate assistance   Lower Body Bathing: Maximal assistance   Upper Body Dressing : Moderate assistance   Lower Body Dressing: Maximal assistance   Toilet Transfer: +2 for physical assistance;Maximal assistance Toilet Transfer Details (indicate cue type and reason): benefits from chair in R visual field Toileting- Clothing Manipulation and Hygiene: Total assistance       Functional mobility during ADLs: +2 for physical assistance;+2 for  safety/equipment;Maximal assistance General ADL Comments: pt noted to have L lean preference. pt liable with mention of pending family arrival. Family asked to bring dentures that are not present to help with meals. pt with puree diet at this time.      Vision   Vision Assessment?: Yes;Vision impaired- to be further tested in functional context Eye  Alignment: Impaired (comment) Ocular Range of Motion: Restricted on the left Additional Comments: pt noted to have L eye strabimus of extropia     Perception Perception Perception Tested?: Yes Perception Deficits: Inattention/neglect;Body part identification Inattention/Neglect: Does not attend to left visual field;Does not attend to left side of body   Praxis      Pertinent Vitals/Pain Pain Assessment: No/denies pain     Hand Dominance Right   Extremity/Trunk Assessment Upper Extremity Assessment Upper Extremity Assessment: LUE deficits/detail LUE Deficits / Details: scapula elevation, scapula depression present, can adduct shoulder, triceps present, pt attempting to use R UE to move L UE when in the visual field. pt with inattention to L side of body completely without max cues. pt shows wrist / supination with associative reaction yawning. no hand movement noted at this time LUE Sensation: decreased light touch;decreased proprioception LUE Coordination: decreased fine motor;decreased gross motor   Lower Extremity Assessment Lower Extremity Assessment: Defer to PT evaluation RLE Deficits / Details: RLE functionally at least 4/5 LLE Deficits / Details: Moving LLE automatically and to command; functionally at least 3/5; self-corrected knee instability while standing; sensation intact LLE Coordination: decreased fine motor;decreased gross motor   Cervical / Trunk Assessment Cervical / Trunk Assessment: Kyphotic   Communication Communication Communication: Expressive difficulties   Cognition Arousal/Alertness: Awake/alert Behavior During Therapy: Flat affect Overall Cognitive Status: No family/caregiver present to determine baseline cognitive functioning Area of Impairment: Attention;Following commands;Safety/judgement;Awareness;Problem solving;Orientation                 Orientation Level: Disoriented to;Situation Current Attention Level: Sustained   Following  Commands: Follows one step commands inconsistently Safety/Judgement: Decreased awareness of safety;Decreased awareness of deficits Awareness: Emergent Problem Solving: Slow processing;Difficulty sequencing;Requires verbal cues General Comments: L-side inattention; not consistently responding to questions asked by PT while standing on left side, able to scan past midline with verbal cues; very poor awareness of LUE. Difficulty switching attention. Some perseveration; frequently asking for coca cola. Intermittently confused regarding situation. When asked about deficits, pt says, "My talking isn't getting better.Marland KitchenMarland KitchenI don't want to talk like a retard" Pt perseverating at times on answers and then giving the correct answer. Pt requesting soda "coke" repetitively during session.    General Comments  Hr 147 sustained. pt noted to  have some scrotum edema present    Exercises     Shoulder Instructions      Home Living Family/patient expects to be discharged to:: Inpatient rehab Living Arrangements: Spouse/significant other                               Additional Comments: Pt unreliable historian      Prior Functioning/Environment          Comments: Pt unreliable historian        OT Problem List: Decreased strength;Decreased range of motion;Decreased activity tolerance;Impaired balance (sitting and/or standing);Impaired vision/perception;Decreased coordination;Decreased cognition;Decreased safety awareness;Decreased knowledge of precautions;Decreased knowledge of use of DME or AE;Cardiopulmonary status limiting activity;Impaired sensation;Obesity;Impaired UE functional use      OT Treatment/Interventions: Self-care/ADL training;Therapeutic exercise;Neuromuscular education;Energy conservation;DME and/or AE  instruction;Manual therapy;Modalities;Therapeutic activities;Cognitive remediation/compensation;Visual/perceptual remediation/compensation;Patient/family education;Balance  training    OT Goals(Current goals can be found in the care plan section) Acute Rehab OT Goals Patient Stated Goal: to be able to talk normal again OT Goal Formulation: Patient unable to participate in goal setting Time For Goal Achievement: 01/16/18 Potential to Achieve Goals: Good  OT Frequency: Min 3X/week   Barriers to D/C:            Co-evaluation PT/OT/SLP Co-Evaluation/Treatment: Yes Reason for Co-Treatment: Complexity of the patient's impairments (multi-system involvement);Necessary to address cognition/behavior during functional activity;For patient/therapist safety;To address functional/ADL transfers PT goals addressed during session: Mobility/safety with mobility;Balance OT goals addressed during session: ADL's and self-care;Proper use of Adaptive equipment and DME;Strengthening/ROM      AM-PAC PT "6 Clicks" Daily Activity     Outcome Measure Help from another person eating meals?: A Lot Help from another person taking care of personal grooming?: A Lot Help from another person toileting, which includes using toliet, bedpan, or urinal?: A Lot Help from another person bathing (including washing, rinsing, drying)?: Total Help from another person to put on and taking off regular upper body clothing?: A Lot Help from another person to put on and taking off regular lower body clothing?: Total 6 Click Score: 10   End of Session Equipment Utilized During Treatment: Gait belt Nurse Communication: Mobility status;Precautions  Activity Tolerance: Patient tolerated treatment well Patient left: in chair;with call bell/phone within reach;with chair alarm set  OT Visit Diagnosis: Unsteadiness on feet (R26.81);Muscle weakness (generalized) (M62.81)                Time: 2641-5830 OT Time Calculation (min): 29 min Charges:  OT General Charges $OT Visit: 1 Visit OT Evaluation $OT Eval Moderate Complexity: 1 Mod   Jeri Modena, OTR/L  Acute Rehabilitation Services Pager:  [phone] Office: [phone] .   Parke Poisson B 01/02/2018, 11:05 AM

## 2018-01-02 NOTE — Progress Notes (Signed)
STROKE TEAM PROGRESS NOTE   SUBJECTIVE (INTERVAL HISTORY) His wife and two other male family members are at the bedside.  Pt lying in bed, sleeping after working with PT/OT and after MBS. DVT neg. No acute event overnight. Passed swallow and on dys 1 and nectar thick liquid.   OBJECTIVE Vitals:   01/02/18 0422 01/02/18 0559 01/02/18 0724 01/02/18 0758  BP: (!) 184/116 (!) 182/99 (!) 202/112 (!) 188/105  Pulse:  87 63 62  Resp: (!) 21 (!) 22 16   Temp: 98.4 F (36.9 C)  98.8 F (37.1 C)   TempSrc: Oral  Oral   SpO2: 97% 96% 99% 100%  Weight:      Height:        CBC:  Recent Labs  Lab 01/01/18 0308 01/01/18 0315 01/02/18 0449  WBC 8.2  --  9.6  NEUTROABS 3.7  --   --   HGB 12.2* 12.6* 12.3*  HCT 41.5 37.0* 38.4*  MCV 89.1  --  84.0  PLT 279  --  175    Basic Metabolic Panel:  Recent Labs  Lab 01/01/18 0308 01/01/18 0315 01/02/18 0614  NA 141 144 141  K 4.2 4.2 3.6  CL 112* 111 110  CO2 23  --  23  GLUCOSE 104* 103* 119*  BUN 19 21 14   CREATININE 1.46* 1.60* 1.30*  CALCIUM 9.7  --  9.7  MG  --   --  2.1    Lipid Panel:     Component Value Date/Time   CHOL 127 01/01/2018 0308   CHOL 186 09/07/2017 1617   TRIG 73 01/01/2018 0308   HDL 39 (L) 01/01/2018 0308   HDL 39 (L) 09/07/2017 1617   CHOLHDL 3.3 01/01/2018 0308   VLDL 15 01/01/2018 0308   LDLCALC 73 01/01/2018 0308   LDLCALC 126 (H) 09/07/2017 1617   HgbA1c:  Lab Results  Component Value Date   HGBA1C 5.8 (H) 01/01/2018   Urine Drug Screen:     Component Value Date/Time   LABOPIA NONE DETECTED 01/01/2018 0352   COCAINSCRNUR NONE DETECTED 01/01/2018 0352   LABBENZ NONE DETECTED 01/01/2018 0352   AMPHETMU NONE DETECTED 01/01/2018 0352   THCU NONE DETECTED 01/01/2018 0352   LABBARB NONE DETECTED 01/01/2018 0352    Alcohol Level     Component Value Date/Time   ETH <10 01/01/2018 0308    IMAGING  Ct Angio Head W Or Wo Contrast Ct Angio Neck W Or Wo Contrast Ct Angio Perfusion  Scan 01/01/2018 IMPRESSION:   CTA NECK:  1. No hemodynamically significant stenosis ICA's.  2. Mild stenosis RIGHT vertebral artery origin.   CTA HEAD:  1. No emergent large vessel occlusion.  . Moderate cerebral artery atherosclerosis.   CT PERFUSION:  1. Small acute RIGHT frontal/MCA penumbra though limited by motion artifact.   Aortic Atherosclerosis. Emphysema.   Mr Brain Wo Contrast 01/01/2018 IMPRESSION:  1. Acute/subacute right MCA territory infarcts involving the right frontal operculum an and diffusely along the right frontal and parietal lobes. The area along the convexity may be in a watershed distribution.  2. Additional confluent white matter changes are present bilaterally, likely reflecting the sequela of chronic microvascular ischemia.  3. Remote lacunar infarcts of the basal ganglia including a remote hemorrhagic infarct of the right thalamus.    Ct C-spine No Charge 01/01/2018 IMPRESSION:  1. No fracture or malalignment.  2. Moderate canal stenosis C5-6 and C6-7.  3. Moderate C4-5 through C6-7 neural foraminal narrowing.  Ct Head Code Stroke Wo Contrast 01/01/2018 IMPRESSION:  1. No acute intracranial process.  2. ASPECTS is 10.  3. Moderate to severe chronic small vessel ischemic changes.  4. 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    Transthoracic Echocardiogram  - Left ventricle: The cavity size was normal. There was moderate   concentric hypertrophy. Systolic function was normal. The   estimated ejection fraction was in the range of 60% to 65%. Wall   motion was normal; there were no regional wall motion   abnormalities. Doppler parameters are consistent with abnormal   left ventricular relaxation (grade 1 diastolic dysfunction).   Acoustic contrast opacification revealed no evidence ofthrombus. - Aortic valve: Valve area (VTI): 2.38 cm^2. Valve area (Vmax):   2.41 cm^2. Valve area (Vmean): 2.4 cm^2. - Left atrium: The atrium  was moderately dilated.  LE venous doppler There is no obvious evidence of DVT or SVT noted in the bilateral lower extremities.    PHYSICAL EXAM  Temp:  [98.4 F (36.9 C)-98.9 F (37.2 C)] 98.8 F (37.1 C) (11/03 0724) Pulse Rate:  [51-96] 62 (11/03 0758) Resp:  [16-25] 16 (11/03 0724) BP: (160-203)/(92-116) 188/105 (11/03 0758) SpO2:  [96 %-100 %] 100 % (11/03 0758)  General - Well nourished, well developed, lethargic.  Ophthalmologic - fundi not visualized due to noncooperation.  Cardiovascular - Regular rate and rhythm.  Mental Status -  Level of arousal and orientation to time, place, and person were intact. Moderate dysarthria, no aphasia but paucity of speech, able to follow all simple commands, naming 2/3, repetition intact but dysarthric, left neglect.   Cranial Nerves II - XII - II - left visual neglect vs. hemianopia. III, IV, VI - right gaze preference, able to cross midline. V - Facial sensation intact bilaterally. VII - left facial droop VIII - Hearing & vestibular intact bilaterally. X - Palate elevates symmetrically, moderate dysarthria. XI - Chin turning & shoulder shrug intact bilaterally. XII - Tongue protrusion intact.  Motor Strength - The patient's strength was normal in all extremities except LUE 3-/5 proximal and 2/5 distally.  Bulk was normal and fasciculations were absent.   Motor Tone - Muscle tone was assessed at the neck and appendages and was normal.  Reflexes - The patient's reflexes were symmetrical in all extremities and he had no pathological reflexes.  Sensory - Light touch, temperature/pinprick were assessed and were symmetrical.    Coordination - The patient had normal movements in the right hand with no ataxia or dysmetria, but slow action.  Tremor was absent.  Gait and Station - deferred.    ASSESSMENT/PLAN Mr. KURON DOCKEN is a 77 y.o. male with history of CAD s/p CABG x 4, history of blood clots, HTN, HLD, MI, obesity, OSA  and prior stroke.  presenting with acute onset of left facial droop, dysarthria and left hemiplegia. He did not receive IV t-PA due to late presentation.  Strokes:  Rt MCA territory infarcts - embolic - unknown etiology, right ICA soft plaque vs. cardioembolic source.  Resultant dysarthria, left neglect, left UE weakness  CT head - No acute intracranial process.   MRI head - Acute/subacute right MCA territory infarcts involving the right frontal operculum an and diffusely along the right frontal and parietal lobes. Remote right thalamus hemorrhagic infarcts.  CTA H&N - right ICA bifurcation large soft plaque but no significant stenosis  CT Perfusion - Small acute RIGHT frontal/MCA penumbra  2D Echo  EF 60-65%  LE venous doppler  No DVT  Will consider TEE and loop recorder for evaluation of cardioembolic sourcse given hx of CAD/MI s/p CABG - tentatively scheduled for Monday.  LDL - 126  HgbA1c - 5.8  VTE prophylaxis - Lovenox  Diet - NPO  aspirin 81 mg daily prior to admission, now on aspirin 81 and plavix 75.   Patient counseled to be compliant with his antithrombotic medications  Ongoing aggressive stroke risk factor management  Therapy recommendations: CIR recommended  Disposition:  Pending  Dysphagia  passed swallow  On dys 1 and nectar thick liquid  Speech following  CAD/MI s/p CABG  In 2005  On ASA and lovastatin at home  Denies CP or heart palpitation  Will consider TEE and loop given embolic stroke - tentatively scheduled for Monday.  Carotid plaque  Right ICA bifurcation large soft plaque  However, no significant stenosis  Stroke could be due to the non-stenotic plaque  But also need cardio embolic work up  Follow up with [facility] as outpt  Hypertension  BP tends to run high  Home meds: amlodipine and metoprolol . BP gradually normalize in 3-5 days  . Resume home metoprolol . Long-term BP goal normotensive  Hyperlipidemia  Lipid  lowering medication PTA:  Crestor 10 mg daily; Niaspan  LDL 126, goal < 70  Increase crestor to 20mg  daily  Continue statin at discharge  Other Stroke Risk Factors  Advanced age  Former cigarette smoker - quit  Obesity, Body mass index is 36.09 kg/m., recommend weight loss, diet and exercise as appropriate   Hx stroke/TIA - on imaging right thalamus old  Family hx stroke (mother, father and brother)  OSA  Other Active Problems   CKD - creatinine 1.6 -> 1.3   Hospital day # 1  Rosalin Hawking, MD PhD Stroke Neurology 01/02/2018 4:57 PM     To contact Stroke Continuity provider, please refer to [email or web address] After hours, contact General Neurology

## 2018-01-02 NOTE — Progress Notes (Signed)
Modified Barium Swallow Progress Note  Patient Details  Name: [name] MRN: [id number] Date of Birth: [birth year]  Today's Date: 01/02/2018  Modified Barium Swallow completed.  Full report located under Chart Review in the Imaging Section.  Brief recommendations include the following:  Clinical Impression  Pt presents with moderate oropharyngeal dysphagia with sensorimotor deficits.  Pt with decreased oral coordination resulting in premature spillage of barium into phayrnx.  Delayed swallow and decreased laryngeal closure results in significant aspiration of thin via straw with cough response.  Pt did not clear aspirates with cough.  No aspiration or penetration with thin via tsp, nectar, puree, cracker but mild pharyngeal residuals mixed with secretions.  Pt with significantly compromised mastication of cracker *dentures at home which he uses for po* therefore recommend dys1/nectar and tsps thin water/cola with strict precautions.  Of note, pt did become sleepy during MBS and required verbal/tactile dues to remain alert - therefore adequacy of nutrition may be worrisome.  STRICT precautions needed.    Medicine with puree *crushed if large*, start meals with liquids and intermittent dry swallow.  If pt is coughing, he IS ASPIRATING.  Using teach back with video, educated pt to recommendations and reinforced with written strategies/verbal teach and feedback.   Swallow Evaluation Recommendations       SLP Diet Recommendations: Dysphagia 1 (Puree) solids;Nectar thick liquid;Other (Comment)(tsps thin water and cola ok)   Liquid Administration via: Cup;Straw(tsps of of thin soda )   Medication Administration: Crushed with puree   Supervision: Full assist for feeding;Staff to assist with self feeding   Compensations: Slow rate;Small sips/bites   Postural Changes: Seated upright at 90 degrees;Remain semi-upright after after feeds/meals (Comment)   Oral Care Recommendations: Oral care QID   Other Recommendations: Order thickener from pharmacy;Have oral suction available   Luanna Salk, [facility] SLP Acute Rehab Services Pager [phone] Office [phone]  Macario Golds 01/02/2018,2:39 PM

## 2018-01-02 NOTE — Evaluation (Signed)
Physical Therapy Evaluation Patient Details Name: [name] MRN: [id number] DOB: May 23, 1940 Today's Date: 01/02/2018   History of Present Illness  Pt is a 77 y.o. male admitted 01/01/18 after being found down at home by wife, noted to have L facial droop, L-side weakness and dysarthria. 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 PMH includes HTN, CVA (2000), OSA, DJD, cataracts, CAD.    Clinical Impression  Pt presents with an overall decrease in functional mobility secondary to above. PTA, pt lives with wife and has supportive family nearby. Today, pt required mod-maxA+2 for mobility. Demonstrates L-side inattention, visual deficits, decreased awareness, and poor balance strategies. Pt would benefit from continued acute PT services to maximize functional mobility and independence prior to d/c with intensive CIR-level thearpies.     Follow Up Recommendations CIR;Supervision/Assistance - 24 hour    Equipment Recommendations  (TBD)    Recommendations for Other Services Rehab consult     Precautions / Restrictions Precautions Precautions: Fall Restrictions Weight Bearing Restrictions: No      Mobility  Bed Mobility Overal bed mobility: Needs Assistance Bed Mobility: Supine to Sit     Supine to sit: Mod assist     General bed mobility comments: When asked to sit EOB, pt initiating towards R-side, but able to come to L-side with cues; modA to assist trunk elevation, pt not actively engaging LUE, attempting to use momentum  Transfers Overall transfer level: Needs assistance Equipment used: 2 person hand held assist Transfers: Sit to/from Stand Sit to Stand: Max assist;+2 physical assistance         General transfer comment: Pt reliant on momentum and maxA+2 to stand; maxA to hold LUE as pt not attending to it. Self-corrected L knee instability upon  standing  Ambulation/Gait Ambulation/Gait assistance: Mod assist;+2 physical assistance Gait Distance (Feet): 5 Feet Assistive device: 2 person hand held assist Gait Pattern/deviations: Step-to pattern;Leaning posteriorly;Trunk flexed;Decreased dorsiflexion - left;Decreased step length - left Gait velocity: Decreased Gait velocity interpretation: <1.31 ft/sec, indicative of household ambulator General Gait Details: Took pivotal steps to chair, then walked steps forwards/backwards with modA+2 and HHA; initially requiring cues for sequencing with each step, then consistent cues for increasing step length with LLE  Stairs            Wheelchair Mobility    Modified Rankin (Stroke Patients Only) Modified Rankin (Stroke Patients Only) Pre-Morbid Rankin Score: No symptoms Modified Rankin: Moderately severe disability     Balance Overall balance assessment: Needs assistance   Sitting balance-Leahy Scale: Fair       Standing balance-Leahy Scale: Poor                               Pertinent Vitals/Pain Pain Assessment: No/denies pain    Home Living Family/patient expects to be discharged to:: Private residence Living Arrangements: Spouse/significant other               Additional Comments: Pt unreliable historian    Prior Function           Comments: Pt unreliable historian     Hand Dominance   Dominant Hand: Right    Extremity/Trunk Assessment   Upper Extremity Assessment Upper Extremity Assessment: Defer to OT evaluation;LUE deficits/detail    Lower Extremity Assessment Lower Extremity Assessment: LLE deficits/detail;RLE deficits/detail RLE Deficits / Details: RLE functionally at least 4/5 LLE Deficits /  Details: Moving LLE automatically and to command; functionally at least 3/5; self-corrected knee instability while standing; sensation intact LLE Coordination: decreased fine motor;decreased gross motor       Communication    Communication: Expressive difficulties  Cognition Arousal/Alertness: Awake/alert Behavior During Therapy: Flat affect Overall Cognitive Status: No family/caregiver present to determine baseline cognitive functioning Area of Impairment: Attention;Following commands;Safety/judgement;Awareness;Problem solving;Orientation                 Orientation Level: Disoriented to;Situation Current Attention Level: Sustained   Following Commands: Follows one step commands inconsistently Safety/Judgement: Decreased awareness of safety;Decreased awareness of deficits Awareness: Emergent Problem Solving: Slow processing;Difficulty sequencing;Requires verbal cues General Comments: L-side inattention; not consistently responding to questions asked by PT while standing on left side, able to scan past midline with verbal cues; very poor awareness of LUE. Difficulty switching attention. Some perseveration; frequently asking for coca cola. Intermittently confused regarding situation. When asked about deficits, pt says, "My talking isn't getting better.Marland KitchenMarland KitchenI don't want to talk like a retard"      General Comments General comments (skin integrity, edema, etc.): HR up to 147 while standing. Supine BP 198/109, seated BP 172/104, post-transfer BP 195/110    Exercises     Assessment/Plan    PT Assessment Patient needs continued PT services  PT Problem List Decreased strength;Decreased activity tolerance;Decreased balance;Decreased range of motion;Decreased mobility;Decreased cognition;Decreased safety awareness;Cardiopulmonary status limiting activity       PT Treatment Interventions DME instruction;Gait training;Stair training;Functional mobility training;Therapeutic activities;Therapeutic exercise;Balance training;Neuromuscular re-education;Cognitive remediation;Patient/family education    PT Goals (Current goals can be found in the Care Plan section)  Acute Rehab PT Goals Patient Stated Goal: Improved  ability to talk PT Goal Formulation: With patient Time For Goal Achievement: 01/16/18 Potential to Achieve Goals: Fair    Frequency Min 4X/week   Barriers to discharge        Co-evaluation PT/OT/SLP Co-Evaluation/Treatment: Yes Reason for Co-Treatment: Necessary to address cognition/behavior during functional activity;For patient/therapist safety;To address functional/ADL transfers PT goals addressed during session: Mobility/safety with mobility;Balance         AM-PAC PT "6 Clicks" Daily Activity  Outcome Measure Difficulty turning over in bed (including adjusting bedclothes, sheets and blankets)?: Unable Difficulty moving from lying on back to sitting on the side of the bed? : Unable Difficulty sitting down on and standing up from a chair with arms (e.g., wheelchair, bedside commode, etc,.)?: Unable Help needed moving to and from a bed to chair (including a wheelchair)?: A Lot Help needed walking in hospital room?: A Lot Help needed climbing 3-5 steps with a railing? : Total 6 Click Score: 8    End of Session Equipment Utilized During Treatment: Gait belt Activity Tolerance: Patient tolerated treatment well Patient left: in chair;with call bell/phone within reach;with chair alarm set Nurse Communication: Mobility status PT Visit Diagnosis: Other abnormalities of gait and mobility (R26.89);Muscle weakness (generalized) (M62.81)    Time: 4854-6270 PT Time Calculation (min) (ACUTE ONLY): 30 min   Charges:   PT Evaluation $PT Eval Moderate Complexity: Standard, PT, DPT Acute Rehabilitation Services  Pager [phone] Office [place] 01/02/2018, 10:49 AM

## 2018-01-02 NOTE — Progress Notes (Addendum)
MBS completed, full report to follow.  Pt presents with moderate oropharyngeal dysphagia with sensormotor deficits.  Pt with decreased oral coordination resulting in premature spillage of barium into phayrnx.  Delayed swallow and decreased laryngeal closure results in signficant aspiration of thin via straw with cough response.  Pt did not clear aspirates with cough.  No aspiration or penetration with thin via tsp, nectar, puree, cracker but mild pharyngeal residuals mixed with secretions.  Pt with significantly compromised mastication of cracker *dentures at home which he uses for po* therefore recommend dys1/nectar and tsps thin water/cola with strict precautions.  Of note, pt did become sleepy during MBS and required verbal/tactile dues to remain alert - therefore adequacy of nutrition may be worrisome.  STRICT precautions needed.    Medicine with puree *crushed if large*, start meals with liquids and intermittent dry swallow.  If pt is coughing, he IS ASPIRATING.  Using teach back with video, educated pt to recommendations and reinforced with written strategies/verbal teach and feedback.   Of note, pt reports he was sleeping "a lot" prior to admission, concerning for premorbid medical issues.  He states his wife informed his PCP of this issue.   Luanna Salk, [facility] SLP Acute Rehab Services Pager [phone] Office [phone]

## 2018-01-02 NOTE — Progress Notes (Addendum)
   [facility] has been requested to perform a transesophageal echocardiogram on this patient for stroke. After careful review of history and examination, the risks and benefits of transesophageal echocardiogram have been explained including risks of esophageal damage, perforation (1:10,000 risk), bleeding, pharyngeal hematoma as well as other potential complications associated with conscious sedation including aspiration, arrhythmia, respiratory failure and death. Alternatives to treatment were discussed, questions were answered. Patient is willing to proceed. Endoscopy is closed on the weekends so I cannot officially schedule this, but patient's name was submitted to [place] to arrange tomorrow along with other requests that came in this weekend. I do not have the current ability to determine if this can be accommodated on schedule tomorrow but patient was made NPO after midnight by neuro team. Formal orders will follow from our team once patient is officially scheduled.   Of note, patient/family requests that when EP comes to do loop consult that they call daughter Colletta [place] if she is not here - ph [phone] since wife has dr appt in AM. I will pass along to weekday team.  Melina Copa PA-C

## 2018-01-03 ENCOUNTER — Encounter (HOSPITAL_COMMUNITY): Payer: Self-pay | Admitting: *Deleted

## 2018-01-03 ENCOUNTER — Encounter (HOSPITAL_COMMUNITY)
Admission: EM | Disposition: A | Discharge status: Rehabilitation Facility | Payer: Self-pay | Source: Home / Self Care | Attending: Internal Medicine

## 2018-01-03 ENCOUNTER — Inpatient Hospital Stay (HOSPITAL_COMMUNITY): Payer: Medicare Other

## 2018-01-03 DIAGNOSIS — I6389 Other cerebral infarction: Secondary | ICD-10-CM

## 2018-01-03 DIAGNOSIS — D62 Acute posthemorrhagic anemia: Secondary | ICD-10-CM

## 2018-01-03 DIAGNOSIS — I361 Nonrheumatic tricuspid (valve) insufficiency: Secondary | ICD-10-CM

## 2018-01-03 DIAGNOSIS — R531 Weakness: Secondary | ICD-10-CM

## 2018-01-03 DIAGNOSIS — I160 Hypertensive urgency: Secondary | ICD-10-CM

## 2018-01-03 DIAGNOSIS — Z9114 Patient's other noncompliance with medication regimen: Secondary | ICD-10-CM

## 2018-01-03 DIAGNOSIS — M1990 Unspecified osteoarthritis, unspecified site: Secondary | ICD-10-CM

## 2018-01-03 DIAGNOSIS — R000 Tachycardia, unspecified: Secondary | ICD-10-CM

## 2018-01-03 DIAGNOSIS — I639 Cerebral infarction, unspecified: Secondary | ICD-10-CM

## 2018-01-03 DIAGNOSIS — Z86718 Personal history of other venous thrombosis and embolism: Secondary | ICD-10-CM

## 2018-01-03 DIAGNOSIS — I69391 Dysphagia following cerebral infarction: Secondary | ICD-10-CM

## 2018-01-03 DIAGNOSIS — G4733 Obstructive sleep apnea (adult) (pediatric): Secondary | ICD-10-CM

## 2018-01-03 DIAGNOSIS — I25709 Atherosclerosis of coronary artery bypass graft(s), unspecified, with unspecified angina pectoris: Secondary | ICD-10-CM

## 2018-01-03 DIAGNOSIS — Z8673 Personal history of transient ischemic attack (TIA), and cerebral infarction without residual deficits: Secondary | ICD-10-CM

## 2018-01-03 HISTORY — PX: TEE WITHOUT CARDIOVERSION: SHX5443

## 2018-01-03 HISTORY — PX: LOOP RECORDER INSERTION: EP1214

## 2018-01-03 LAB — CBC
HCT: 40.1 % (ref 39.0–52.0)
Hemoglobin: 12.7 g/dL — ABNORMAL LOW (ref 13.0–17.0)
MCH: 27 pg (ref 26.0–34.0)
MCHC: 31.7 g/dL (ref 30.0–36.0)
MCV: 85.3 fL (ref 80.0–100.0)
Platelets: 259 10*3/uL (ref 150–400)
RBC: 4.7 MIL/uL (ref 4.22–5.81)
RDW: 16.3 % — ABNORMAL HIGH (ref 11.5–15.5)
WBC: 9.6 10*3/uL (ref 4.0–10.5)
nRBC: 0 % (ref 0.0–0.2)

## 2018-01-03 LAB — BASIC METABOLIC PANEL
ANION GAP: 3 — AB (ref 5–15)
BUN: 14 mg/dL (ref 8–23)
CALCIUM: 9.9 mg/dL (ref 8.9–10.3)
CO2: 27 mmol/L (ref 22–32)
Chloride: 113 mmol/L — ABNORMAL HIGH (ref 98–111)
Creatinine, Ser: 1.36 mg/dL — ABNORMAL HIGH (ref 0.61–1.24)
GFR calc Af Amer: 57 mL/min — ABNORMAL LOW (ref >60)
GFR, EST NON AFRICAN AMERICAN: 49 mL/min — AB (ref >60)
Glucose, Bld: 97 mg/dL (ref 70–99)
POTASSIUM: 3.9 mmol/L (ref 3.5–5.1)
SODIUM: 143 mmol/L (ref 135–145)

## 2018-01-03 SURGERY — ECHOCARDIOGRAM, TRANSESOPHAGEAL
Anesthesia: Moderate Sedation

## 2018-01-03 SURGERY — LOOP RECORDER INSERTION

## 2018-01-03 MED ORDER — MIDAZOLAM HCL 10 MG/2ML IJ SOLN
INTRAMUSCULAR | Status: DC | PRN
  Administered 2018-01-03: 2 mg via INTRAVENOUS
  Administered 2018-01-03: 1 mg via INTRAVENOUS

## 2018-01-03 MED ORDER — EPINEPHRINE PF 1 MG/ML IJ SOLN
INTRAMUSCULAR | Status: DC | PRN
  Administered 2018-01-03: 30 mL via SUBCUTANEOUS

## 2018-01-03 MED ORDER — FENTANYL CITRATE (PF) 100 MCG/2ML IJ SOLN
INTRAMUSCULAR | Status: AC
  Filled 2018-01-03: qty 2

## 2018-01-03 MED ORDER — SODIUM CHLORIDE 0.9 % IV SOLN
INTRAVENOUS | Status: DC
  Administered 2018-01-03: 500 mL via INTRAVENOUS

## 2018-01-03 MED ORDER — BUTAMBEN-TETRACAINE-BENZOCAINE 2-2-14 % EX AERO
INHALATION_SPRAY | CUTANEOUS | Status: DC | PRN
  Administered 2018-01-03: 2 via TOPICAL

## 2018-01-03 MED ORDER — LIDOCAINE-EPINEPHRINE 1 %-1:100000 IJ SOLN
INTRAMUSCULAR | Status: AC
  Filled 2018-01-03: qty 1

## 2018-01-03 MED ORDER — LACTATED RINGERS IV SOLN
INTRAVENOUS | Status: DC
  Administered 2018-01-03 – 2018-01-04 (×2): via INTRAVENOUS

## 2018-01-03 MED ORDER — MIDAZOLAM HCL 5 MG/ML IJ SOLN
INTRAMUSCULAR | Status: AC
  Filled 2018-01-03: qty 2

## 2018-01-03 MED ORDER — TAMSULOSIN HCL 0.4 MG PO CAPS
0.4000 mg | ORAL_CAPSULE | Freq: Every day | ORAL | Status: DC
  Administered 2018-01-03 – 2018-01-05 (×3): 0.4 mg via ORAL
  Filled 2018-01-03 (×3): qty 1

## 2018-01-03 MED ORDER — FENTANYL CITRATE (PF) 100 MCG/2ML IJ SOLN
INTRAMUSCULAR | Status: DC | PRN
  Administered 2018-01-03: 25 ug via INTRAVENOUS

## 2018-01-03 SURGICAL SUPPLY — 2 items
LOOP REVEAL LINQSYS (Prosthesis & Implant Heart) ×3 IMPLANT
PACK LOOP INSERTION (CUSTOM PROCEDURE TRAY) ×3 IMPLANT

## 2018-01-03 NOTE — Interval H&P Note (Signed)
History and Physical Interval Note:  01/03/2018 2:26 PM  [name]  has presented today for surgery, with the diagnosis of stroke  The various methods of treatment have been discussed with the patient and family. After consideration of risks, benefits and other options for treatment, the patient has consented to  Procedure(s): TRANSESOPHAGEAL ECHOCARDIOGRAM (TEE) (N/A) as a surgical intervention .  The patient's history has been reviewed, patient examined, no change in status, stable for surgery.  I have reviewed the patient's chart and labs.  Questions were answered to the patient's satisfaction.     [name]

## 2018-01-03 NOTE — Progress Notes (Signed)
  Echocardiogram Echocardiogram Transesophageal has been performed.  Johny Chess 01/03/2018, 2:58 PM

## 2018-01-03 NOTE — Progress Notes (Deleted)
  Echocardiogram 2D Echocardiogram has been performed.  [name] 01/03/2018, 2:58 PM

## 2018-01-03 NOTE — Progress Notes (Signed)
STROKE TEAM PROGRESS NOTE   SUBJECTIVE (INTERVAL HISTORY) His wife and son are at the bedside.  Pt lying in bed, drowsy, sleepy but easily arousable and follow commands.  Had TEE which was unremarkable, loop recorder placed.  As per wife, patient walked with PT in the hallway and doing well.  Still has left upper extremity weakness.  PT/OT recommend CIR.  OBJECTIVE Vitals:   01/03/18 1500 01/03/18 1510 01/03/18 1520 01/03/18 1632  BP: (!) 202/108 (!) 199/108 (!) 168/99 (!) 207/102  Pulse: 80 81 77 87  Resp: 19 20 19    Temp:      TempSrc:      SpO2: 97% 99% 98%   Weight:      Height:        CBC:  Recent Labs  Lab 01/01/18 0308  01/02/18 0449 01/03/18 0538  WBC 8.2  --  9.6 9.6  NEUTROABS 3.7  --   --   --   HGB 12.2*   < > 12.3* 12.7*  HCT 41.5   < > 38.4* 40.1  MCV 89.1  --  84.0 85.3  PLT 279  --  279 259   < > = values in this interval not displayed.    Basic Metabolic Panel:  Recent Labs  Lab 01/02/18 0614 01/03/18 0538  NA 141 143  K 3.6 3.9  CL 110 113*  CO2 23 27  GLUCOSE 119* 97  BUN 14 14  CREATININE 1.30* 1.36*  CALCIUM 9.7 9.9  MG 2.1  --     Lipid Panel:     Component Value Date/Time   CHOL 127 01/01/2018 0308   CHOL 186 09/07/2017 1617   TRIG 73 01/01/2018 0308   HDL 39 (L) 01/01/2018 0308   HDL 39 (L) 09/07/2017 1617   CHOLHDL 3.3 01/01/2018 0308   VLDL 15 01/01/2018 0308   LDLCALC 73 01/01/2018 0308   LDLCALC 126 (H) 09/07/2017 1617   HgbA1c:  Lab Results  Component Value Date   HGBA1C 5.8 (H) 01/01/2018   Urine Drug Screen:     Component Value Date/Time   LABOPIA NONE DETECTED 01/01/2018 0352   COCAINSCRNUR NONE DETECTED 01/01/2018 0352   LABBENZ NONE DETECTED 01/01/2018 0352   AMPHETMU NONE DETECTED 01/01/2018 0352   THCU NONE DETECTED 01/01/2018 0352   LABBARB NONE DETECTED 01/01/2018 0352    Alcohol Level     Component Value Date/Time   ETH <10 01/01/2018 0308    IMAGING  Ct Angio Head W Or Wo Contrast Ct Angio  Neck W Or Wo Contrast Ct Angio Perfusion Scan 01/01/2018 IMPRESSION:   CTA NECK:  1. No hemodynamically significant stenosis ICA's.  2. Mild stenosis RIGHT vertebral artery origin.   CTA HEAD:  1. No emergent large vessel occlusion.  . Moderate cerebral artery atherosclerosis.   CT PERFUSION:  1. Small acute RIGHT frontal/MCA penumbra though limited by motion artifact.   Aortic Atherosclerosis. Emphysema.   Mr Brain Wo Contrast 01/01/2018 IMPRESSION:  1. Acute/subacute right MCA territory infarcts involving the right frontal operculum an and diffusely along the right frontal and parietal lobes. The area along the convexity may be in a watershed distribution.  2. Additional confluent white matter changes are present bilaterally, likely reflecting the sequela of chronic microvascular ischemia.  3. Remote lacunar infarcts of the basal ganglia including a remote hemorrhagic infarct of the right thalamus.    Ct C-spine No Charge 01/01/2018 IMPRESSION:  1. No fracture or malalignment.  2. Moderate canal stenosis  C5-6 and C6-7.  3. Moderate C4-5 through C6-7 neural foraminal narrowing.     Ct Head Code Stroke Wo Contrast 01/01/2018 IMPRESSION:  1. No acute intracranial process.  2. ASPECTS is 10.  3. Moderate to severe chronic small vessel ischemic changes.  4. 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    Transthoracic Echocardiogram  - Left ventricle: The cavity size was normal. There was moderate   concentric hypertrophy. Systolic function was normal. The   estimated ejection fraction was in the range of 60% to 65%. Wall   motion was normal; there were no regional wall motion   abnormalities. Doppler parameters are consistent with abnormal   left ventricular relaxation (grade 1 diastolic dysfunction).   Acoustic contrast opacification revealed no evidence ofthrombus. - Aortic valve: Valve area (VTI): 2.38 cm^2. Valve area (Vmax):   2.41 cm^2. Valve area  (Vmean): 2.4 cm^2. - Left atrium: The atrium was moderately dilated.  LE venous doppler There is no obvious evidence of DVT or SVT noted in the bilateral lower extremities.    PHYSICAL EXAM  Temp:  [98 F (36.7 C)-99.2 F (37.3 C)] 98.9 F (37.2 C) (11/04 1456) Pulse Rate:  [53-97] 87 (11/04 1632) Resp:  [14-23] 19 (11/04 1520) BP: (168-219)/(93-128) 207/102 (11/04 1632) SpO2:  [93 %-100 %] 98 % (11/04 1520)  General - Well nourished, well developed, lethargic.  Ophthalmologic - fundi not visualized due to noncooperation.  Cardiovascular - Regular rate and rhythm.  Mental Status -  Level of arousal and orientation to time, place, and person were intact. Moderate dysarthria, no aphasia but paucity of speech, able to follow all simple commands, naming 2/3, repetition intact but dysarthric.   Cranial Nerves II - XII - II - left visual neglect vs. hemianopia. III, IV, VI - right gaze preference, but able to cross midline. V - Facial sensation intact bilaterally. VII - left facial droop VIII - Hearing & vestibular intact bilaterally. X - Palate elevates symmetrically, moderate dysarthria. XI - Chin turning & shoulder shrug intact bilaterally. XII - Tongue protrusion intact.  Motor Strength - The patient's strength was normal in all extremities except LUE 3/5 proximal and 2/5 distally.  Bulk was normal and fasciculations were absent.   Motor Tone - Muscle tone was assessed at the neck and appendages and was normal.  Reflexes - The patient's reflexes were symmetrical in all extremities and he had no pathological reflexes.  Sensory - Light touch, temperature/pinprick were assessed and were symmetrical.    Coordination - The patient had normal movements in the right hand with no ataxia or dysmetria, but slow action.  Tremor was absent.  Gait and Station - deferred.    ASSESSMENT/PLAN Mr. GASPAR FOWLE is a 77 y.o. male with history of CAD s/p CABG x 4, history of blood  clots, HTN, HLD, MI, obesity, OSA and prior stroke.  presenting with acute onset of left facial droop, dysarthria and left hemiplegia. He did not receive IV t-PA due to late presentation.  Strokes:  Rt MCA territory infarcts - embolic - unknown etiology, right ICA soft plaque vs. cardioembolic source.  Resultant dysarthria, left neglect, left UE weakness  CT head - No acute intracranial process.   MRI head - Acute/subacute right MCA territory infarcts involving the right frontal operculum an and diffusely along the right frontal and parietal lobes. Remote right thalamus hemorrhagic infarcts.  CTA H&N - right ICA bifurcation large soft plaque but no significant stenosis  CT Perfusion -  Small acute RIGHT frontal/MCA penumbra  2D Echo  EF 60-65%  LE venous doppler  No DVT  TEE unremarkable, no PFO.  Loop recorder placed  LDL - 126  HgbA1c - 5.8  VTE prophylaxis - Lovenox  Diet - NPO  aspirin 81 mg daily prior to admission, now on aspirin 81 and plavix 75.  Recommend to continue DAPT for 3 weeks and then Plavix alone.  Patient counseled to be compliant with his antithrombotic medications  Ongoing aggressive stroke risk factor management  Therapy recommendations: CIR  Disposition:  Pending  Dysphagia  passed swallow  On dys 1 and nectar thick liquid  Speech following  CAD/MI s/p CABG  In 2005  On ASA and lovastatin at home  Denies CP or heart palpitation  Carotid plaque  Right ICA bifurcation large soft plaque  However, no significant stenosis  Stroke could be due to the non-stenotic plaque  But also need cardio embolic work up  Follow up with [facility] as outpt  Hypertension  BP tends to run high  Home meds: amlodipine and metoprolol . BP gradually normalize in 3-5 days  . Resume home metoprolol . Long-term BP goal normotensive  Hyperlipidemia  Lipid lowering medication PTA:  Crestor 10 mg daily; Niaspan  LDL 126, goal < 70  Increase crestor to  20mg  daily  Continue statin at discharge  Other Stroke Risk Factors  Advanced age  Former cigarette smoker - quit  Obesity, Body mass index is 36.09 kg/m., recommend weight loss, diet and exercise as appropriate   Hx stroke/TIA - on imaging right thalamus old  Family hx stroke (mother, father and brother)  OSA  Other Active Problems   CKD - creatinine 1.6 -> 1.3-> 1.36   Hospital day # 2  Neurology will sign off. Please call with questions. Pt will follow up with stroke clinic NP at [facility] in about 4 weeks. Thanks for the consult.   Rosalin Hawking, MD PhD Stroke Neurology 01/03/2018 6:49 PM     To contact Stroke Continuity provider, please refer to [email or web address] After hours, contact General Neurology

## 2018-01-03 NOTE — Progress Notes (Signed)
Physical Therapy Treatment Patient Details Name: [name] MRN: [id number] DOB: [birth year] Today's Date: 01/03/2018    History of Present Illness Pt is a 77 y.o. male admitted 01/01/18 after being found down at home by wife, noted to have L facial droop, L-side weakness and dysarthria. 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 PMH includes HTN, CVA (2000), OSA, DJD, cataracts, CAD.    PT Comments    Pt with improved ability to move L LE and was able to tolearte amb x 60' with modAx2. Pt cont to demo significant L sided neglect and L UE impairment. Pt cont to benefit from CIR Upon d/c for maximal functional recovery. Acute PT to cont to follow.   Follow Up Recommendations  CIR;Supervision/Assistance - 24 hour     Equipment Recommendations  Rolling walker with 5" wheels    Recommendations for Other Services Rehab consult     Precautions / Restrictions Precautions Precautions: Fall Restrictions Weight Bearing Restrictions: No    Mobility  Bed Mobility Overal bed mobility: Needs Assistance Bed Mobility: Supine to Sit;Sit to Supine     Supine to sit: Mod assist Sit to supine: Max assist   General bed mobility comments: pt unable to use L UE functionally but was able to bring bilat LE both up into bed and out of bed  Transfers Overall transfer level: Needs assistance Equipment used: Rolling walker (2 wheeled) Transfers: Sit to/from Stand Sit to Stand: Mod assist;+2 safety/equipment         General transfer comment: pt with improved power up, modA to maintain balance during transition of hands, modA to bring L UE up on to RW, modA to maintain grip on walker as well  Ambulation/Gait Ambulation/Gait assistance: Mod assist;+2 safety/equipment Gait Distance (Feet): 60 Feet Assistive device: Rolling walker (2 wheeled) Gait Pattern/deviations: Step-to pattern;Decreased stride  length Gait velocity: Decreased Gait velocity interpretation: <1.8 ft/sec, indicate of risk for recurrent falls General Gait Details: pt with decreased L foot clearance and step length, pt with constant gaze to the R and vearing right requiring max directional verbal cues and modA for walker management. with onset of fatigue with with increased L lateral lean   Stairs             Wheelchair Mobility    Modified Rankin (Stroke Patients Only) Modified Rankin (Stroke Patients Only) Pre-Morbid Rankin Score: No symptoms Modified Rankin: Moderately severe disability     Balance Overall balance assessment: Needs assistance Sitting-balance support: Feet supported Sitting balance-Leahy Scale: Fair Sitting balance - Comments: able to sit with close supervision   Standing balance support: Bilateral upper extremity supported Standing balance-Leahy Scale: Poor Standing balance comment: dependent on RW                            Cognition Arousal/Alertness: Awake/alert Behavior During Therapy: WFL for tasks assessed/performed(tearful about condition) Overall Cognitive Status: Impaired/Different from baseline Area of Impairment: Safety/judgement                         Safety/Judgement: Decreased awareness of safety;Decreased awareness of deficits(L sided neglect) Awareness: Emergent Problem Solving: Slow processing;Difficulty sequencing;Requires verbal cues;Requires tactile cues General Comments: Pt with R gaze preferance, L sided neglect      Exercises      General Comments        Pertinent Vitals/Pain  Pain Assessment: No/denies pain    Home Living                      Prior Function            PT Goals (current goals can now be found in the care plan section) Progress towards PT goals: Progressing toward goals    Frequency    Min 4X/week      PT Plan Current plan remains appropriate    Co-evaluation               AM-PAC PT "6 Clicks" Daily Activity  Outcome Measure  Difficulty turning over in bed (including adjusting bedclothes, sheets and blankets)?: Unable Difficulty moving from lying on back to sitting on the side of the bed? : Unable Difficulty sitting down on and standing up from a chair with arms (e.g., wheelchair, bedside commode, etc,.)?: Unable Help needed moving to and from a bed to chair (including a wheelchair)?: A Lot Help needed walking in hospital room?: A Lot Help needed climbing 3-5 steps with a railing? : Total 6 Click Score: 8    End of Session Equipment Utilized During Treatment: Gait belt Activity Tolerance: Patient tolerated treatment well Patient left: in bed;with call bell/phone within reach;with chair alarm set(pt leaving for a test) Nurse Communication: Mobility status PT Visit Diagnosis: Other abnormalities of gait and mobility (R26.89);Muscle weakness (generalized) (M62.81)     Time: 3507-5732 PT Time Calculation (min) (ACUTE ONLY): 25 min  Charges:  $Gait Training: 23-37 mins                     Kittie Plater, PT, DPT Acute Rehabilitation Services Pager #: [phone] Office #: [phone]    Berline Lopes 01/03/2018, 2:28 PM

## 2018-01-03 NOTE — H&P (View-Only) (Signed)
PROGRESS NOTE                                                                                                                                                                                                             Patient Demographics:    [name], is a 77 y.o. male, DOB - [birth year], JGG:[id number]  Admit date - 01/01/2018   Admitting Physician Rise Patience, MD  Outpatient Primary MD for the patient is Rogers Blocker, MD  LOS - 2  Chief Complaint  Patient presents with  . Code Stroke       Brief Narrative  [name] is a 77 y.o. male with history of CAD status post CABG, hypertension, chronic kidney disease, hyperlipidemia, sleep apnea was found on the floor by patient's wife around 2 AM this morning in the bathroom.  Patient's wife woke up after she heard a sound.  On exam patient was having left facial drooping dysarthria and left-sided weakness.  He was diagnosed with CVA and admitted to the hospital.   Subjective:   Patient in bed, appears comfortable, denies any headache, no fever, no chest pain or pressure, no shortness of breath , no abdominal pain. No focal weakness.   Assessment  & Plan :     1.  Left-sided weakness, dysphagia, dysarthria due to acute right MCA territory infarcts noted on MRI.  CTA head and neck unremarkable, full stroke work-up underway, currently on rectal aspirin, Plavix and statin, statin dose has been increased, neuro on board.  He is due for modified barium swallow along with TEE and loop recorder placement on 01/03/2018.   Lab Results  Component Value Date   HGBA1C 5.8 (H) 01/01/2018   Lab Results  Component Value Date   CHOL 127 01/01/2018   HDL 39 (L) 01/01/2018   LDLCALC 73 01/01/2018   TRIG 73 01/01/2018   CHOLHDL 3.3 01/01/2018    2.  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  Creatinine close to his baseline of 1.4.  Proving with gentle hydration while n.p.o.  3.  Essential hypertension.  Allow for permissive hypertension for stroke.   PRN hydralazine with parameters.  4.  CAD.  No acute issue.  Continue aspirin and statin for now, resume beta-blocker once blood pressure requirements have eased.  5.  Dyslipidemia.  Resume home dose statin and niacin once taking oral.  6.  GERD.  On PPI.  7. HX Gout. Resume allopurinol once taking orally.    Family Communication  :  None  Code Status :  Full  Disposition Plan  :  TBD  Consults  :  Neuro  Procedures  :    Leg [place] -  Preliminary report:  There is no obvious evidence of DVT or SVT noted in the bilateral lower extremities.   MRI - 1. Acute/subacute right MCA territory infarcts involving the right frontal operculum an and diffusely along the right frontal and parietal lobes. The area along the convexity may be in a watershed distribution. 2. Additional confluent white matter changes are present bilaterally, likely reflecting the sequela of chronic microvascular ischemia. 3. Remote lacunar infarcts of the basal ganglia including a remote hemorrhagic infarct of the right thalamus  TEE  TTE Left ventricle: The cavity size was normal. There was moderate concentric hypertrophy. Systolic function was normal. The estimated ejection fraction was in the range of 60% to 65%. Wall motion was normal; there were no regional wall motion abnormalities. Doppler parameters are consistent with abnormal left ventricular relaxation (grade 1 diastolic dysfunction).  Acoustic contrast opacification revealed no evidence of thrombus. - Aortic valve: Valve area (VTI): 2.38 cm^2. Valve area (Vmax):  2.41 cm^2. Valve area (Vmean): 2.4 cm^2. - Left atrium: The atrium was moderately dilated.  CTA NECK: 1. No hemodynamically significant stenosis ICA's. 2. Mild stenosis RIGHT vertebral artery origin. CTA HEAD: 1. No emergent large vessel occlusion. 2. Moderate cerebral artery atherosclerosis. CT PERFUSION: 1. Small acute RIGHT frontal/MCA penumbra though limited by motion artifact   DVT Prophylaxis   :  Lovenox   Lab Results  Component Value Date   PLT 259 01/03/2018    Diet :  Diet Order            Diet NPO time specified  Diet effective now               Inpatient Medications Scheduled Meds: . allopurinol  200 mg Oral Daily  . aspirin EC  81 mg Oral Daily  . clopidogrel  75 mg Oral Daily  . enoxaparin (LOVENOX) injection  40 mg Subcutaneous Q24H  . metoprolol tartrate  25 mg Oral BID  . niacin  500 mg Oral QHS  . pantoprazole  40 mg Oral Daily  . rosuvastatin  20 mg Oral q1800  . tamsulosin  0.4 mg Oral Daily   Continuous Infusions: . lactated ringers     PRN Meds:.acetaminophen **OR** acetaminophen (TYLENOL) oral liquid 160 mg/5 mL **OR** acetaminophen, hydrALAZINE, RESOURCE THICKENUP CLEAR, senna-docusate  Antibiotics  :   Anti-infectives (From admission, onward)   None          Objective:   Vitals:   01/02/18 2123 01/02/18 2300 01/03/18 0400 01/03/18 0756  BP: (!) 188/107 (!) 180/93 (!) 179/95 (!) 193/95  Pulse:  61 (!) 53 77  Resp:  18 18 19   Temp:  98.8 F (37.1 C) 98.5 F (36.9 C) 98.4 F (36.9 C)  TempSrc:  Oral Oral Oral  SpO2:  96% 96% 98%  Weight:      Height:        Wt Readings from Last 3 Encounters:  01/01/18 127.5 kg  11/07/17 104.3 kg  11/02/17 127.7 kg     Intake/Output Summary (Last 24 hours) at 01/03/2018 0957 Last data filed at 01/03/2018 0900 Gross per 24 hour  Intake 914.43 ml  Output 2000 ml  Net -1085.57 ml     Physical Exam  Awake ,  left arm 0-1/5, left leg 3/5, right-sided extremities 5/5, does have left-sided neglect and  left-sided nasolabial flattening [place].AT,PERRAL Supple Neck,No JVD, No cervical lymphadenopathy appriciated.  Symmetrical Chest wall movement, Good air movement bilaterally, CTAB RRR,No Gallops, Rubs or new Murmurs, No Parasternal Heave +ve B.Sounds, Abd Soft, No tenderness, No organomegaly appriciated, No rebound - guarding or rigidity. No Cyanosis, Clubbing or edema, No new Rash or  bruise       Data Review:    CBC Recent Labs  Lab 01/01/18 0308 01/01/18 0315 01/02/18 0449 01/03/18 0538  WBC 8.2  --  9.6 9.6  HGB 12.2* 12.6* 12.3* 12.7*  HCT 41.5 37.0* 38.4* 40.1  PLT 279  --  279 259  MCV 89.1  --  84.0 85.3  MCH 26.2  --  26.9 27.0  MCHC 29.4*  --  32.0 31.7  RDW 16.2*  --  15.9* 16.3*  LYMPHSABS 3.2  --   --   --   MONOABS 0.9  --   --   --   EOSABS 0.3  --   --   --   BASOSABS 0.0  --   --   --     Chemistries  Recent Labs  Lab 01/01/18 0308 01/01/18 0315 01/02/18 0614 01/03/18 0538  NA 141 144 141 143  K 4.2 4.2 3.6 3.9  CL 112* 111 110 113*  CO2 23  --  23 27  GLUCOSE 104* 103* 119* 97  BUN 19 21 14 14   CREATININE 1.46* 1.60* 1.30* 1.36*  CALCIUM 9.7  --  9.7 9.9  MG  --   --  2.1  --   AST 19  --   --   --   ALT 14  --   --   --   ALKPHOS 52  --   --   --   BILITOT 0.3  --   --   --    ------------------------------------------------------------------------------------------------------------------ Recent Labs    01/01/18 0308  CHOL 127  HDL 39*  LDLCALC 73  TRIG 73  CHOLHDL 3.3    Lab Results  Component Value Date   HGBA1C 5.8 (H) 01/01/2018   ------------------------------------------------------------------------------------------------------------------ No results for input(s): TSH, T4TOTAL, T3FREE, THYROIDAB in the last 72 hours.  Invalid input(s): FREET3 ------------------------------------------------------------------------------------------------------------------ No results for input(s): VITAMINB12, FOLATE, FERRITIN, TIBC, IRON, RETICCTPCT in the last 72 hours.  Coagulation profile Recent Labs  Lab 01/01/18 0308  INR 1.06    No results for input(s): DDIMER in the last 72 hours.  Cardiac Enzymes No results for input(s): CKMB, TROPONINI, MYOGLOBIN in the last 168 hours.  Invalid input(s):  CK ------------------------------------------------------------------------------------------------------------------    Component Value Date/Time   BNP 59.6 12/09/2016 1209    Micro Results No results found for this or any previous visit (from the past 240 hour(s)).  Radiology Reports Ct Angio Head W Or Wo Contrast  Result Date: 01/01/2018 CLINICAL DATA:  Follow up code stroke. LEFT-sided weakness. History of stroke, hypertension and hyperlipidemia. EXAM: CT ANGIOGRAPHY HEAD AND NECK CT PERFUSION BRAIN TECHNIQUE: Multidetector CT imaging of the head and neck was performed using the standard protocol during bolus administration of intravenous contrast. Multiplanar CT image reconstructions and MIPs were obtained to evaluate the vascular anatomy. Carotid stenosis measurements (when applicable) are obtained utilizing NASCET criteria, using the distal internal carotid diameter as the denominator. Multiphase CT imaging of the brain was performed following IV bolus contrast injection. Subsequent parametric perfusion maps were calculated using RAPID software. CONTRAST:  180mL ISOVUE-370 IOPAMIDOL (ISOVUE-370) INJECTION 76% COMPARISON:  CT HEAD January 01, 2018 FINDINGS: CTA  NECK FINDINGS: AORTIC ARCH: Normal appearance of the thoracic arch, normal branch pattern. Mild calcific atherosclerosis aortic arch. The origins of the innominate, left Common carotid artery and subclavian artery are widely patent. RIGHT CAROTID SYSTEM: Common carotid artery is patent. Calcific atherosclerosis resulting in less than 50% stenosis by NASCET criteria. Normal appearance of the internal carotid artery. LEFT CAROTID SYSTEM: Common carotid artery is patent. Mild calcific atherosclerosis of the carotid bifurcation without hemodynamically significant stenosis by NASCET criteria. Normal appearance of the internal carotid artery. VERTEBRAL ARTERIES:Left vertebral artery is dominant. Calcific atherosclerosis resulting in mild stenosis  RIGHT vertebral artery. Degenerative changes cervical spine resultant mild extrinsic compression. SKELETON: No acute osseous process though bone windows have not been submitted. Status post median sternotomy. 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 Patient is edentulous. OTHER NECK: Soft tissues of the neck are nonacute though, not tailored for evaluation. UPPER CHEST: Included lung apices are clear. Mild centrilobular emphysema. No superior mediastinal lymphadenopathy. CTA HEAD FINDINGS: ANTERIOR CIRCULATION: Patent cervical internal carotid arteries, petrous, cavernous and supra clinoid internal carotid arteries. Patent anterior communicating artery. Patent anterior and middle cerebral arteries, moderate luminal irregularity compatible with atherosclerosis. No large vessel occlusion, significant stenosis, contrast extravasation or aneurysm. POSTERIOR CIRCULATION: Patent vertebral arteries, vertebrobasilar junction and basilar artery, as well as main branch vessels. Patent posterior cerebral arteries, moderate luminal irregularity compatible with atherosclerosis. No large vessel occlusion, significant stenosis, contrast extravasation or aneurysm. VENOUS SINUSES: Major dural venous sinuses are patent though not tailored for evaluation on this angiographic examination. ANATOMIC VARIANTS: None. DELAYED PHASE: Not performed. MIP images reviewed. CT Brain Perfusion Findings-motion degraded examination: CBF (<30%) Volume: 20mL Perfusion (Tmax>6.0s) volume: 31mL Mismatch Volume: 65mL Infarction Location:RIGHT frontal lobe. IMPRESSION: CTA NECK: 1. No hemodynamically significant stenosis ICA's. 2. Mild stenosis RIGHT vertebral artery origin. CTA HEAD: 1. No emergent large vessel occlusion. 2. Moderate cerebral artery atherosclerosis. CT PERFUSION: 1. Small acute RIGHT frontal/MCA penumbra though limited by motion artifact. Critical Value/emergent results text paged to Dr.ERIC  [facility] via AMION secure system on 01/01/2018 at 3:55 am, including interpreting physician's phone number. Aortic Atherosclerosis (ICD10-I70.0). Emphysema (ICD10-J43.9). Electronically Signed   By: Elon Alas M.D.   On: 01/01/2018 03:56   Ct Angio Neck W Or Wo Contrast  Result Date: 01/01/2018 CLINICAL DATA:  Follow up code stroke. LEFT-sided weakness. History of stroke, hypertension and hyperlipidemia. EXAM: CT ANGIOGRAPHY HEAD AND NECK CT PERFUSION BRAIN TECHNIQUE: Multidetector CT imaging of the head and neck was performed using the standard protocol during bolus administration of intravenous contrast. Multiplanar CT image reconstructions and MIPs were obtained to evaluate the vascular anatomy. Carotid stenosis measurements (when applicable) are obtained utilizing NASCET criteria, using the distal internal carotid diameter as the denominator. Multiphase CT imaging of the brain was performed following IV bolus contrast injection. Subsequent parametric perfusion maps were calculated using RAPID software. CONTRAST:  166mL ISOVUE-370 IOPAMIDOL (ISOVUE-370) INJECTION 76% COMPARISON:  CT HEAD January 01, 2018 FINDINGS: CTA NECK FINDINGS: AORTIC ARCH: Normal appearance of the thoracic arch, normal branch pattern. Mild calcific atherosclerosis aortic arch. The origins of the innominate, left Common carotid artery and subclavian artery are widely patent. RIGHT CAROTID SYSTEM: Common carotid artery is patent. Calcific atherosclerosis resulting in less than 50% stenosis by NASCET criteria. Normal appearance of the internal carotid artery. LEFT CAROTID SYSTEM: Common carotid artery is patent. Mild calcific atherosclerosis of the carotid bifurcation without hemodynamically significant stenosis by NASCET criteria. Normal appearance of the internal carotid artery. VERTEBRAL ARTERIES:Left  vertebral artery is dominant. Calcific atherosclerosis resulting in mild stenosis RIGHT vertebral artery. Degenerative changes  cervical spine resultant mild extrinsic compression. SKELETON: No acute osseous process though bone windows have not been submitted. Status post median sternotomy. 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 Patient is edentulous. OTHER NECK: Soft tissues of the neck are nonacute though, not tailored for evaluation. UPPER CHEST: Included lung apices are clear. Mild centrilobular emphysema. No superior mediastinal lymphadenopathy. CTA HEAD FINDINGS: ANTERIOR CIRCULATION: Patent cervical internal carotid arteries, petrous, cavernous and supra clinoid internal carotid arteries. Patent anterior communicating artery. Patent anterior and middle cerebral arteries, moderate luminal irregularity compatible with atherosclerosis. No large vessel occlusion, significant stenosis, contrast extravasation or aneurysm. POSTERIOR CIRCULATION: Patent vertebral arteries, vertebrobasilar junction and basilar artery, as well as main branch vessels. Patent posterior cerebral arteries, moderate luminal irregularity compatible with atherosclerosis. No large vessel occlusion, significant stenosis, contrast extravasation or aneurysm. VENOUS SINUSES: Major dural venous sinuses are patent though not tailored for evaluation on this angiographic examination. ANATOMIC VARIANTS: None. DELAYED PHASE: Not performed. MIP images reviewed. CT Brain Perfusion Findings-motion degraded examination: CBF (<30%) Volume: 47mL Perfusion (Tmax>6.0s) volume: 6mL Mismatch Volume: 12mL Infarction Location:RIGHT frontal lobe. IMPRESSION: CTA NECK: 1. No hemodynamically significant stenosis ICA's. 2. Mild stenosis RIGHT vertebral artery origin. CTA HEAD: 1. No emergent large vessel occlusion. 2. Moderate cerebral artery atherosclerosis. CT PERFUSION: 1. Small acute RIGHT frontal/MCA penumbra though limited by motion artifact. Critical Value/emergent results text paged to Dr.ERIC [facility] via AMION secure system on 01/01/2018 at  3:55 am, including interpreting physician's phone number. Aortic Atherosclerosis (ICD10-I70.0). Emphysema (ICD10-J43.9). Electronically Signed   By: Elon Alas M.D.   On: 01/01/2018 03:56   Mr Brain Wo Contrast  Result Date: 01/01/2018 CLINICAL DATA:  Left-sided weakness. Focal neuro deficit of greater than 6 hours. EXAM: MRI HEAD WITHOUT CONTRAST TECHNIQUE: Multiplanar, multiecho pulse sequences of the brain and surrounding structures were obtained without intravenous contrast. COMPARISON:  CT head without contrast 01/01/2018. CTA head and neck 01/01/2018. FINDINGS: Brain: The diffusion-weighted images demonstrate a confluent nonhemorrhagic infarct in the right frontal operculum. Additional scattered areas of nonhemorrhagic infarct are present over the right frontal and parietal lobe as well as the right occipital lobe. The pattern follows a watershed distribution. T2 signal changes are associated with the areas of restricted diffusion. Additional confluent periventricular T2 signal changes are present bilaterally. Ventricles are of normal size. No significant extra-axial fluid collection present. A remote hemorrhagic lacunar infarct is present the right thalamus or posterior limb internal capsule. There is a remote hemorrhage in the anterior left frontal lobe. Vascular: Flow is present in the major intracranial arteries. Skull and upper cervical spine: The craniocervical junction is normal. Upper cervical spine is unremarkable. Marrow signal is somewhat depressed. This likely corresponds with anemia. Sinuses/Orbits: The paranasal sinuses and mastoid air cells are clear. Globes and orbits are within normal limits. IMPRESSION: 1. Acute/subacute right MCA territory infarcts involving the right frontal operculum an and diffusely along the right frontal and parietal lobes. The area along the convexity may be in a watershed distribution. 2. Additional confluent white matter changes are present bilaterally,  likely reflecting the sequela of chronic microvascular ischemia. 3. Remote lacunar infarcts of the basal ganglia including a remote hemorrhagic infarct of the right thalamus. Electronically Signed   By: San Morelle M.D.   On: 01/01/2018 10:20   Ct C-spine No Charge  Result Date: 01/01/2018 CLINICAL DATA:  Mechanical fall. EXAM: CT CERVICAL SPINE WITHOUT  CONTRAST TECHNIQUE: Reformatted CT imaging of the cervical spine was performed from CT angiogram head and neck. Multiplanar CT image reconstructions were also generated. COMPARISON:  None. FINDINGS: ALIGNMENT: Straightened lordosis.  Vertebral bodies in alignment. SKULL BASE AND VERTEBRAE: Cervical vertebral bodies and posterior elements are intact. Moderate C4-5, moderate to severe C6-7 disc height loss, mild at C5-6. Multilevel uncovertebral hypertrophy and endplate spurring. No destructive bony lesions. 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 Calcified craniocervical ligaments. C5-6 calcified ligamentum flavum. SOFT TISSUES AND SPINAL CANAL: Nonacute. Nuchal ligament calcification. DISC LEVELS: Moderate canal stenosis C5-6 and C6-7. Moderate RIGHT C4-5, RIGHT C5-6 and bilateral C6-7 neural foraminal narrowing. UPPER CHEST: Lung apices are clear. OTHER: None. IMPRESSION: 1. No fracture or malalignment. 2. Moderate canal stenosis C5-6 and C6-7. 3. Moderate C4-5 through C6-7 neural foraminal narrowing. Electronically Signed   By: Elon Alas M.D.   On: 01/01/2018 04:18   Ct Cerebral Perfusion W Contrast  Result Date: 01/01/2018 CLINICAL DATA:  Follow up code stroke. LEFT-sided weakness. History of stroke, hypertension and hyperlipidemia. EXAM: CT ANGIOGRAPHY HEAD AND NECK CT PERFUSION BRAIN TECHNIQUE: Multidetector CT imaging of the head and neck was performed using the standard protocol during bolus administration of intravenous contrast. Multiplanar CT image reconstructions and MIPs were obtained to evaluate the vascular anatomy.  Carotid stenosis measurements (when applicable) are obtained utilizing NASCET criteria, using the distal internal carotid diameter as the denominator. Multiphase CT imaging of the brain was performed following IV bolus contrast injection. Subsequent parametric perfusion maps were calculated using RAPID software. CONTRAST:  178mL ISOVUE-370 IOPAMIDOL (ISOVUE-370) INJECTION 76% COMPARISON:  CT HEAD January 01, 2018 FINDINGS: CTA NECK FINDINGS: AORTIC ARCH: Normal appearance of the thoracic arch, normal branch pattern. Mild calcific atherosclerosis aortic arch. The origins of the innominate, left Common carotid artery and subclavian artery are widely patent. RIGHT CAROTID SYSTEM: Common carotid artery is patent. Calcific atherosclerosis resulting in less than 50% stenosis by NASCET criteria. Normal appearance of the internal carotid artery. LEFT CAROTID SYSTEM: Common carotid artery is patent. Mild calcific atherosclerosis of the carotid bifurcation without hemodynamically significant stenosis by NASCET criteria. Normal appearance of the internal carotid artery. VERTEBRAL ARTERIES:Left vertebral artery is dominant. Calcific atherosclerosis resulting in mild stenosis RIGHT vertebral artery. Degenerative changes cervical spine resultant mild extrinsic compression. SKELETON: No acute osseous process though bone windows have not been submitted. Status post median sternotomy. 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 Patient is edentulous. OTHER NECK: Soft tissues of the neck are nonacute though, not tailored for evaluation. UPPER CHEST: Included lung apices are clear. Mild centrilobular emphysema. No superior mediastinal lymphadenopathy. CTA HEAD FINDINGS: ANTERIOR CIRCULATION: Patent cervical internal carotid arteries, petrous, cavernous and supra clinoid internal carotid arteries. Patent anterior communicating artery. Patent anterior and middle cerebral arteries, moderate luminal  irregularity compatible with atherosclerosis. No large vessel occlusion, significant stenosis, contrast extravasation or aneurysm. POSTERIOR CIRCULATION: Patent vertebral arteries, vertebrobasilar junction and basilar artery, as well as main branch vessels. Patent posterior cerebral arteries, moderate luminal irregularity compatible with atherosclerosis. No large vessel occlusion, significant stenosis, contrast extravasation or aneurysm. VENOUS SINUSES: Major dural venous sinuses are patent though not tailored for evaluation on this angiographic examination. ANATOMIC VARIANTS: None. DELAYED PHASE: Not performed. MIP images reviewed. CT Brain Perfusion Findings-motion degraded examination: CBF (<30%) Volume: 63mL Perfusion (Tmax>6.0s) volume: 38mL Mismatch Volume: 54mL Infarction Location:RIGHT frontal lobe. IMPRESSION: CTA NECK: 1. No hemodynamically significant stenosis ICA's. 2. Mild stenosis RIGHT vertebral artery origin. CTA HEAD: 1. No emergent large  vessel occlusion. 2. Moderate cerebral artery atherosclerosis. CT PERFUSION: 1. Small acute RIGHT frontal/MCA penumbra though limited by motion artifact. Critical Value/emergent results text paged to Dr.ERIC [facility] via AMION secure system on 01/01/2018 at 3:55 am, including interpreting physician's phone number. Aortic Atherosclerosis (ICD10-I70.0). Emphysema (ICD10-J43.9). Electronically Signed   By: Elon Alas M.D.   On: 01/01/2018 03:56   Dg Swallowing Func-speech Pathology  Result Date: 01/02/2018 Objective Swallowing Evaluation: Type of Study: MBS-Modified Barium Swallow Study  Patient Details Name: DASHAN CHIZMAR MRN: [id number] Date of Birth: 1940-06-22 Today's Date: 01/02/2018 Time: SLP Start Time (ACUTE ONLY): 9432 -SLP Stop Time (ACUTE ONLY): 0935 SLP Time Calculation (min) (ACUTE ONLY): 31 min Past Medical History: Past Medical History: Diagnosis Date . CAD (coronary artery disease)   s/p cath in 2005 and CABG x4 . Cataract  . DJD (degenerative joint  disease)  . DJD (degenerative joint disease)  . Fluid retention   mild . H/O: gout  . History of blood clots  . HTN (hypertension)  . Hyperlipidemia  . Hyperlipidemia  . Myocardial infarction ([place]) 2009 . Obesity   with reduction . OSA (obstructive sleep apnea)   AHI 98/hr . Renal insufficiency  . Sleep apnea  . Stroke [facility]) 2000 Past Surgical History: Past Surgical History: Procedure Laterality Date . CORONARY ARTERY BYPASS GRAFT  2005  LIMA to LAD, SVG to OM1 and OM 2, RCA.  [name] left inguinal hernia repair   . TEE WITHOUT CARDIOVERSION N/A 12/11/2016  Procedure: TRANSESOPHAGEAL ECHOCARDIOGRAM (TEE);  Surgeon: [name], MD;  Location: [facility] ENDOSCOPY;  Service: Cardiovascular;  Laterality: N/A; HPI: pt is a 77 yo adm to [facility] after being found on the floor.  pmh + for cva, cad s/p cabg, cervical spondylosis, ckd, htn, sleep apnea.  pt with left facial droop and dysarthria as well as left sided weakness.  speech and swallow eval ordered.  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   Subjective: pt awake in chair Assessment / Plan / Recommendation [facility]L IP CLINICAL IMPRESSIONS 01/02/2018 Clinical Impression Pt presents with moderate oropharyngeal dysphagia with sensorimotor deficits.  Pt with decreased oral coordination resulting in premature spillage of barium into phayrnx.  Delayed swallow and decreased laryngeal closure results in significant aspiration of thin via straw with cough response.  Pt did not clear aspirates with cough.  No aspiration or penetration with thin via tsp, nectar, puree, cracker but mild pharyngeal residuals mixed with secretions.  Pt with significantly compromised mastication of cracker *dentures at home which he uses for po* therefore recommend dys1/nectar and tsps thin water/[place] with strict precautions.  Of note, pt did become sleepy during MBS and required verbal/tactile dues to remain alert - therefore adequacy of nutrition may be worrisome.   STRICT precautions needed.    Medicine with puree *crushed if large*, start meals with liquids and intermittent dry swallow.  If pt is coughing, he IS ASPIRATING.  Using teach back with video, educated pt to recommendations and reinforced with written strategies/verbal teach and feedback. SLP Visit Diagnosis Dysphagia, oropharyngeal phase (R13.12) Attention and concentration deficit following -- Frontal lobe and executive function deficit following -- Impact on safety and function Moderate aspiration risk   [facility] IP TREATMENT RECOMMENDATION 01/02/2018 Treatment Recommendations Therapy as outlined in treatment plan below   Prognosis 01/01/2018 Prognosis for Safe Diet Advancement Guarded Barriers to Reach Goals -- Barriers/Prognosis Comment -- [facility] IP DIET RECOMMENDATION 01/02/2018 SLP Diet Recommendations Dysphagia 1 (  Puree) solids;Nectar thick liquid;Other (Comment) Liquid Administration via Cup;Straw Medication Administration Crushed with puree Compensations Slow rate;Small sips/bites Postural Changes Seated upright at 90 degrees;Remain semi-upright after after feeds/meals (Comment)   [facility] IP OTHER RECOMMENDATIONS 01/02/2018 Recommended Consults -- Oral Care Recommendations Oral care QID Other Recommendations Order thickener from pharmacy;Have oral suction available   [facility] IP FOLLOW UP RECOMMENDATIONS 01/02/2018 Follow up Recommendations Inpatient Rehab;Skilled Nursing facility   [facility] IP FREQUENCY AND DURATION 01/02/2018 Speech Therapy Frequency (ACUTE ONLY) min 1 x/week Treatment Duration 1 week      [facility] IP ORAL PHASE 01/02/2018 Oral Phase Impaired Oral - Pudding Teaspoon -- Oral - Pudding Cup -- Oral - Honey Teaspoon -- Oral - Honey Cup -- Oral - Nectar Teaspoon Delayed oral transit;Premature spillage;Reduced posterior propulsion Oral - Nectar Cup Delayed oral transit;Premature spillage Oral - Nectar Straw Decreased bolus cohesion;Premature spillage;Reduced posterior propulsion Oral - Thin Teaspoon Decreased bolus  cohesion;Premature spillage;Reduced posterior propulsion Oral - Thin Cup Decreased bolus cohesion;Premature spillage;Reduced posterior propulsion Oral - Thin Straw Decreased bolus cohesion;Premature spillage;Reduced posterior propulsion Oral - Puree Decreased bolus cohesion;Premature spillage;Reduced posterior propulsion Oral - Mech Soft Premature spillage;Impaired mastication;Reduced posterior propulsion Oral - Regular -- Oral - Multi-Consistency -- Oral - Pill -- Oral Phase - Comment --  [facility] IP PHARYNGEAL PHASE 01/02/2018 Pharyngeal Phase Impaired Pharyngeal- Pudding Teaspoon -- Pharyngeal -- Pharyngeal- Pudding Cup -- Pharyngeal -- Pharyngeal- Honey Teaspoon -- Pharyngeal -- Pharyngeal- Honey Cup -- Pharyngeal -- Pharyngeal- Nectar Teaspoon Reduced tongue base retraction;Reduced airway/laryngeal closure;Reduced laryngeal elevation Pharyngeal -- Pharyngeal- Nectar Cup Reduced tongue base retraction;Reduced laryngeal elevation;Reduced airway/laryngeal closure Pharyngeal -- Pharyngeal- Nectar Straw Reduced tongue base retraction;Reduced laryngeal elevation;Reduced airway/laryngeal closure;Reduced pharyngeal peristalsis;Reduced epiglottic inversion Pharyngeal -- Pharyngeal- Thin Teaspoon Reduced tongue base retraction;Delayed swallow initiation-vallecula;Reduced airway/laryngeal closure;Reduced laryngeal elevation;Reduced pharyngeal peristalsis;Reduced epiglottic inversion Pharyngeal -- Pharyngeal- Thin Cup Reduced tongue base retraction;Pharyngeal residue - valleculae;Delayed swallow initiation-pyriform sinuses;Reduced airway/laryngeal closure;Reduced laryngeal elevation;Reduced pharyngeal peristalsis;Reduced epiglottic inversion Pharyngeal -- Pharyngeal- Thin Straw Reduced laryngeal elevation;Reduced airway/laryngeal closure;Reduced tongue base retraction;Penetration/Aspiration during swallow;Moderate aspiration;Delayed swallow initiation-pyriform sinuses;Reduced pharyngeal peristalsis;Reduced epiglottic inversion  Pharyngeal Material enters airway, passes BELOW cords and not ejected out despite cough attempt by patient Pharyngeal- Puree Reduced tongue base retraction;Pharyngeal residue - valleculae;Reduced laryngeal elevation;Reduced airway/laryngeal closure;Reduced epiglottic inversion;Reduced pharyngeal peristalsis Pharyngeal -- Pharyngeal- Mechanical Soft Reduced tongue base retraction;Pharyngeal residue - valleculae Pharyngeal -- Pharyngeal- Regular -- Pharyngeal -- Pharyngeal- Multi-consistency -- Pharyngeal -- Pharyngeal- Pill -- Pharyngeal -- Pharyngeal Comment --  No flowsheet data found. Macario Golds 01/02/2018, 2:40 PM  Luanna Salk, MS [facility] SLP Acute Rehab Services Pager [phone] Office [phone]             Ct Head Code Stroke Wo Contrast  Result Date: 01/01/2018 CLINICAL DATA:  Code stroke. LEFT-sided weakness. History of stroke, hypertension and hyperlipidemia. EXAM: CT HEAD WITHOUT CONTRAST TECHNIQUE: Contiguous axial images were obtained from the base of the skull through the vertex without intravenous contrast. COMPARISON:  MRI head March 17, 2013 FINDINGS: BRAIN: No intraparenchymal hemorrhage, mass effect nor midline shift. Old RIGHT cerebellar infarcts. Confluent pontine and supratentorial white matter hypodensities. Old RIGHT basal ganglia infarcts with ex vacuo dilatation RIGHT frontal horn of the lateral ventricle. No acute large vascular territory infarcts. No abnormal extra-axial fluid collections. Basal cisterns are patent. VASCULAR: Moderate calcific atherosclerosis of the carotid siphons. SKULL: No skull fracture. No significant scalp soft tissue swelling. SINUSES/ORBITS: Mild paranasal sinus mucosal thickening. Mastoid air cells are well aerated.LEFT buphthalmos and ocular lens implant. OTHER: Patient  is edentulous. ASPECTS [facility] Stroke Program Early CT Score) - Ganglionic level infarction (caudate, lentiform nuclei, internal capsule, insula, M1-M3 cortex): 7 -  Supraganglionic infarction (M4-M6 cortex): 3 Total score (0-10 with 10 being normal): 10 IMPRESSION: 1. No acute intracranial process. 2. ASPECTS is 10. 3. Moderate to severe chronic small vessel ischemic changes. 4. 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 5. Critical Value/emergent results text paged to [place], Neurology via [place] secure system on 01/01/2018 at 3:27 am, including interpreting physician's phone number. Electronically Signed   By: Elon Alas M.D.   On: 01/01/2018 03:27    Time Spent in minutes  30   Lala Lund M.D on 01/03/2018 at 9:57 AM  To page go to [email or web address] - password [email or web address]

## 2018-01-03 NOTE — Consult Note (Signed)
Physical Medicine and Rehabilitation Consult   Reason for Consult: Stroke with functional deficits Referring Physician: Dr. Candiss Norse   HPI: [name] is a 77 y.o. male with history of CAD, DJD, OSA--non compliant due to frequency,  h/o DVTs, CVA; who was admitted on 01/01/18 with acute onset of left facial weakness with dysarthria, left sided weakness and fall.  History taken from chart review, family, and patient.  CT perfusion of head done revealing small acute right frontal/MCA penumbra and CTA head/neck showed moderate cerebral artery atherosclerosis with mid stenosis R-VA origin. MRI brain showed reviewed, showing multiple right infarcts.  Per report, acute/subacute infarcts involving right frontal operculum and diffusely along right frontal and parietal lobes and remote hemorrhagic infarct in right thalamus.  Patient with lethargy with wet voice and difficulty handling secretions. MBS done on 11/3 and he was started on dysphagia 1, nectar liquids with strict aspiration precautions.  2D echo done revealing EF 60-65% with no wall abnormalities and aortic sclerosis without stenosis. BLE dopplers were negative for DVT.  Carotid Doppler showed right ICA bifurcation large soft plaque however no significant stenosis. He was started on aspirin Plavix for embolic stroke felt to be from R-ICA soft plaque versus cardioembolic source.  TEE ordered for work-up.  Patient on aspirin Plavix for secondary stroke prevention  Review of Systems  Constitutional: Positive for malaise/fatigue.  HENT: Negative for hearing loss and tinnitus.   Eyes: Negative for blurred vision and double vision.  Respiratory: Positive for cough. Negative for shortness of breath.   Cardiovascular: Positive for leg swelling. Negative for chest pain and palpitations.  Gastrointestinal: Negative for heartburn and nausea.  Genitourinary: Positive for frequency and urgency. Negative for dysuria.       Gets up four times at nighs.    Musculoskeletal: Positive for falls. Negative for myalgias.  Skin: Negative for rash.  Neurological: Positive for speech change, focal weakness, weakness and headaches.  All other systems reviewed and are negative.  Past Medical History:  Diagnosis Date  . CAD (coronary artery disease)    s/p cath in 2005 and CABG x4  . Cataract   . DJD (degenerative joint disease)   . DJD (degenerative joint disease)   . Fluid retention    mild  . H/O: gout   . History of blood clots   . HTN (hypertension)   . Hyperlipidemia   . Hyperlipidemia   . Myocardial infarction ([place]) 2009  . Obesity    with reduction  . OSA (obstructive sleep apnea)    AHI 98/hr  . Renal insufficiency   . Sleep apnea   . Stroke [facility]) 2000    Past Surgical History:  Procedure Laterality Date  . CORONARY ARTERY BYPASS GRAFT  2005   LIMA to LAD, SVG to OM1 and OM 2, RCA.   [name] left inguinal hernia repair    . TEE WITHOUT CARDIOVERSION N/A 12/11/2016   Procedure: TRANSESOPHAGEAL ECHOCARDIOGRAM (TEE);  Surgeon: [name], MD;  Location: [facility] ENDOSCOPY;  Service: Cardiovascular;  Laterality: N/A;    Family History  Problem Relation Age of Onset  . Gout Mother   . Stroke Mother   . Coronary artery disease Mother   . Hypertension Mother   . Arthritis Mother   . Coronary artery disease Father   . Arthritis Father   . Gout Father   . Stroke Father   . Hypertension Father   . Coronary artery disease Brother   . Arthritis Brother   .  Gout Brother   . Stroke Brother   . Hypertension Brother   . Coronary artery disease Brother   . Arthritis Brother   . Gout Brother   . Stroke Brother   . Hypertension Brother   . Coronary artery disease Unknown        significant in multiple family members    Social History:  Married. He reports that he has quit smoking--2011. His smoking use included cigars. He has never used smokeless tobacco. He reports that he does not drink alcohol or use drugs.   Allergies    Allergen Reactions  . Ramipril Cough    Medications Prior to Admission  Medication Sig Dispense Refill  . allopurinol (ZYLOPRIM) 100 MG tablet Take 200 mg by mouth daily.     [name] aspirin EC 81 MG tablet Take 81 mg by mouth daily.    . furosemide (LASIX) 20 MG tablet Take 20 mg by mouth 2 (two) times daily.      [name] lovastatin (MEVACOR) 20 MG tablet TAKE ONE TABLET BY MOUTH TWICE DAILY. MUST HAVE OFFICE VISIT BEFORE FURTHER REFILL. (Patient taking differently: Take 20 mg by mouth 2 (two) times daily. ) 60 tablet 6  . metoprolol tartrate (LOPRESSOR) 25 MG tablet TAKE ONE TABLET BY MOUTH TWICE DAILY (Patient taking differently: Take 25 mg by mouth 2 (two) times daily. ) 30 tablet 0  . montelukast (SINGULAIR) 10 MG tablet Take 10 mg by mouth at bedtime.    . niacin (NIASPAN) 500 MG CR tablet Take 500 mg by mouth at bedtime.      [name] omeprazole (PRILOSEC) 40 MG capsule Take 40 mg by mouth daily.    . potassium chloride (K-DUR) 10 MEQ tablet Take 10 mEq by mouth 2 (two) times daily.    . rosuvastatin (CRESTOR) 10 MG tablet Take 10 mg by mouth at bedtime.    [name] amLODipine (NORVASC) 5 MG tablet TAKE ONE TABLET BY MOUTH ONCE DAILY. (Patient not taking: Reported on 01/01/2018) 30 tablet 3  . phosphorus (K PHOS NEUTRAL) [id number] MG tablet Take 2 tablets (500 mg total) by mouth 3 (three) times daily. (Patient not taking: Reported on 01/01/2018) 3 tablet 0    Home: Home Living Family/patient expects to be discharged to:: Inpatient rehab Living Arrangements: Spouse/significant other Additional Comments: Pt unreliable historian  Functional History: Prior Function Comments: Pt unreliable historian Functional Status:  Mobility: Bed Mobility Overal bed mobility: Needs Assistance Bed Mobility: Supine to Sit Supine to sit: Mod assist General bed mobility comments: When asked to sit EOB, pt initiating towards R-side, but able to come to L-side with cues; modA to assist trunk elevation, pt not actively  engaging LUE, attempting to use momentum Transfers Overall transfer level: Needs assistance Equipment used: 2 person hand held assist Transfers: Sit to/from Stand Sit to Stand: Max assist, +2 physical assistance General transfer comment: Pt reliant on momentum and maxA+2 to stand; maxA to hold LUE as pt not attending to it. Self-corrected L knee instability upon standing Ambulation/Gait Ambulation/Gait assistance: Mod assist, +2 physical assistance Gait Distance (Feet): 5 Feet Assistive device: 2 person hand held assist Gait Pattern/deviations: Step-to pattern, Leaning posteriorly, Trunk flexed, Decreased dorsiflexion - left, Decreased step length - left General Gait Details: Took pivotal steps to chair, then walked steps forwards/backwards with modA+2 and HHA; initially requiring cues for sequencing with each step, then consistent cues for increasing step length with LLE Gait velocity: Decreased Gait velocity interpretation: <1.31 ft/sec, indicative of household ambulator  ADL: ADL Overall ADL's : Needs assistance/impaired Eating/Feeding: Moderate assistance Eating/Feeding Details (indicate cue type and reason): pt with nectar thick liquid recommendation and tray delivered with thins. RN called and SLP called regarding diet order. Pt provided correct nectar thick by RN. Pt provided tsp cola during session with therapist present.  Grooming: Moderate assistance, Wash/dry face, Sitting Grooming Details (indicate cue type and reason): pt needs cues to wipe L side and attention to detail. pt with decr sensory input to L side present Upper Body Bathing: Moderate assistance Lower Body Bathing: Maximal assistance Upper Body Dressing : Moderate assistance Lower Body Dressing: Maximal assistance Toilet Transfer: +2 for physical assistance, Maximal assistance Toilet Transfer Details (indicate cue type and reason): benefits from chair in R visual field Toileting- Clothing Manipulation and  Hygiene: Total assistance Functional mobility during ADLs: +2 for physical assistance, +2 for safety/equipment, Maximal assistance General ADL Comments: pt noted to have L lean preference. pt liable with mention of pending family arrival. Family asked to bring dentures that are not present to help with meals. pt with puree diet at this time.   Cognition: Cognition Overall Cognitive Status: No family/caregiver present to determine baseline cognitive functioning Arousal/Alertness: Awake/alert Orientation Level: Oriented X4 Attention: Selective Memory: Appears intact Awareness: Appears intact(for swallow and speech impairment, poor awareness to difficulty in being transferred to chair due to gross weakness) Problem Solving: Impaired(for basic, to use call bell ) Problem Solving Impairment: Functional basic Safety/Judgment: Impaired Cognition Arousal/Alertness: Awake/alert Behavior During Therapy: Flat affect Overall Cognitive Status: No family/caregiver present to determine baseline cognitive functioning Area of Impairment: Attention, Following commands, Safety/judgement, Awareness, Problem solving, Orientation Orientation Level: Disoriented to, Situation Current Attention Level: Sustained Following Commands: Follows one step commands inconsistently Safety/Judgement: Decreased awareness of safety, Decreased awareness of deficits Awareness: Emergent Problem Solving: Slow processing, Difficulty sequencing, Requires verbal cues General Comments: L-side inattention; not consistently responding to questions asked by PT while standing on left side, able to scan past midline with verbal cues; very poor awareness of LUE. Difficulty switching attention. Some perseveration; frequently asking for coca cola. Intermittently confused regarding situation. When asked about deficits, pt says, "My talking isn't getting better.Marland KitchenMarland KitchenI don't want to talk like a retard" Pt perseverating at times on answers and then  giving the correct answer. Pt requesting soda "coke" repetitively during session.    Blood pressure (!) 193/95, pulse 77, temperature 98.4 F (36.9 C), temperature source Oral, resp. rate 19, height 6\' 2"  (1.88 m), weight 127.5 kg, SpO2 98 %. Physical Exam  Nursing note and vitals reviewed. Constitutional: He is oriented to person, place, and time. He appears well-developed. No distress.  Obese  HENT:  Head: Normocephalic and atraumatic.  Eyes: Right eye exhibits no discharge. Left eye exhibits no discharge.  Left eye ptosis  Neck: Normal range of motion. Neck supple.  Cardiovascular: Normal rate and regular rhythm.  Respiratory: Effort normal and breath sounds normal.  GI: Soft. Bowel sounds are normal.  Musculoskeletal:  Lower extremity edema  Neurological: He is alert and oriented to person, place, and time.  Alert and oriented  Left facial weakness with ptosis and dysarthria  Left visual field deficits in inferior lateral field.  Motor: Right upper extremity: 5/5 proximal distal Right lower extremity: Hip flexion, knee extension 4/5, ankle dorsiflexion 5/5 Left upper extremity: 1+/5 proximal distal Left lower extremity: Flexion, knee extension 3+/5, ankle dorsiflexion 4+/5 Sensation intact light touch  Skin: Skin is warm and dry. He is not diaphoretic.  Vascular changes bilateral  lower extremities  Psychiatric: He has a normal mood and affect. His behavior is normal. His speech is slurred.    Results for orders placed or performed during the hospital encounter of 01/01/18 (from the past 24 hour(s))  CBC     Status: Abnormal   Collection Time: 01/03/18  5:38 AM  Result Value Ref Range   WBC 9.6 4.0 - 10.5 K/uL   RBC 4.70 4.22 - 5.81 MIL/uL   Hemoglobin 12.7 (L) 13.0 - 17.0 g/dL   HCT 40.1 39.0 - 52.0 %   MCV 85.3 80.0 - 100.0 fL   MCH 27.0 26.0 - 34.0 pg   MCHC 31.7 30.0 - 36.0 g/dL   RDW 16.3 (H) 11.5 - 15.5 %   Platelets 259 150 - 400 K/uL   nRBC 0.0 0.0 - 0.2 %    Basic metabolic panel     Status: Abnormal   Collection Time: 01/03/18  5:38 AM  Result Value Ref Range   Sodium 143 135 - 145 mmol/L   Potassium 3.9 3.5 - 5.1 mmol/L   Chloride 113 (H) 98 - 111 mmol/L   CO2 27 22 - 32 mmol/L   Glucose, Bld 97 70 - 99 mg/dL   BUN 14 8 - 23 mg/dL   Creatinine, Ser 1.36 (H) 0.61 - 1.24 mg/dL   Calcium 9.9 8.9 - 10.3 mg/dL   GFR calc non Af Amer 49 (L) >60 mL/min   GFR calc Af Amer 57 (L) >60 mL/min   Anion gap 3 (L) 5 - 15   Mr Brain Wo Contrast  Result Date: 01/01/2018 CLINICAL DATA:  Left-sided weakness. Focal neuro deficit of greater than 6 hours. EXAM: MRI HEAD WITHOUT CONTRAST TECHNIQUE: Multiplanar, multiecho pulse sequences of the brain and surrounding structures were obtained without intravenous contrast. COMPARISON:  CT head without contrast 01/01/2018. CTA head and neck 01/01/2018. FINDINGS: Brain: The diffusion-weighted images demonstrate a confluent nonhemorrhagic infarct in the right frontal operculum. Additional scattered areas of nonhemorrhagic infarct are present over the right frontal and parietal lobe as well as the right occipital lobe. The pattern follows a watershed distribution. T2 signal changes are associated with the areas of restricted diffusion. Additional confluent periventricular T2 signal changes are present bilaterally. Ventricles are of normal size. No significant extra-axial fluid collection present. A remote hemorrhagic lacunar infarct is present the right thalamus or posterior limb internal capsule. There is a remote hemorrhage in the anterior left frontal lobe. Vascular: Flow is present in the major intracranial arteries. Skull and upper cervical spine: The craniocervical junction is normal. Upper cervical spine is unremarkable. Marrow signal is somewhat depressed. This likely corresponds with anemia. Sinuses/Orbits: The paranasal sinuses and mastoid air cells are clear. Globes and orbits are within normal limits. IMPRESSION: 1.  Acute/subacute right MCA territory infarcts involving the right frontal operculum an and diffusely along the right frontal and parietal lobes. The area along the convexity may be in a watershed distribution. 2. Additional confluent white matter changes are present bilaterally, likely reflecting the sequela of chronic microvascular ischemia. 3. Remote lacunar infarcts of the basal ganglia including a remote hemorrhagic infarct of the right thalamus. Electronically Signed   By: San Morelle M.D.   On: 01/01/2018 10:20   Dg Swallowing Func-speech Pathology  Result Date: 01/02/2018 Objective Swallowing Evaluation: Type of Study: MBS-Modified Barium Swallow Study  Patient Details Name: [name] MRN: [id number] Date of Birth: 1940/12/25 Today's Date: 01/02/2018 Time: SLP Start Time (ACUTE ONLY): 7915 -SLP Stop Time (  ACUTE ONLY): 0935 SLP Time Calculation (min) (ACUTE ONLY): 31 min Past Medical History: Past Medical History: Diagnosis Date . CAD (coronary artery disease)   s/p cath in 2005 and CABG x4 . Cataract  . DJD (degenerative joint disease)  . DJD (degenerative joint disease)  . Fluid retention   mild . H/O: gout  . History of blood clots  . HTN (hypertension)  . Hyperlipidemia  . Hyperlipidemia  . Myocardial infarction ([place]) 2009 . Obesity   with reduction . OSA (obstructive sleep apnea)   AHI 98/hr . Renal insufficiency  . Sleep apnea  . Stroke [facility]) 2000 Past Surgical History: Past Surgical History: Procedure Laterality Date . CORONARY ARTERY BYPASS GRAFT  2005  LIMA to LAD, SVG to OM1 and OM 2, RCA.  [name] left inguinal hernia repair   . TEE WITHOUT CARDIOVERSION N/A 12/11/2016  Procedure: TRANSESOPHAGEAL ECHOCARDIOGRAM (TEE);  Surgeon: [name], MD;  Location: [facility] ENDOSCOPY;  Service: Cardiovascular;  Laterality: N/A; HPI: pt is a 77 yo adm to [facility] after being found on the floor.  pmh + for cva, cad s/p cabg, cervical spondylosis, ckd, htn, sleep apnea.  pt with left facial droop and dysarthria  as well as left sided weakness.  speech and swallow eval ordered.  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   Subjective: pt awake in chair Assessment / Plan / Recommendation CHL IP CLINICAL IMPRESSIONS 01/02/2018 Clinical Impression Pt presents with moderate oropharyngeal dysphagia with sensorimotor deficits.  Pt with decreased oral coordination resulting in premature spillage of barium into phayrnx.  Delayed swallow and decreased laryngeal closure results in significant aspiration of thin via straw with cough response.  Pt did not clear aspirates with cough.  No aspiration or penetration with thin via tsp, nectar, puree, cracker but mild pharyngeal residuals mixed with secretions.  Pt with significantly compromised mastication of cracker *dentures at home which he uses for po* therefore recommend dys1/nectar and tsps thin water/cola with strict precautions.  Of note, pt did become sleepy during MBS and required verbal/tactile dues to remain alert - therefore adequacy of nutrition may be worrisome.  STRICT precautions needed.    Medicine with puree *crushed if large*, start meals with liquids and intermittent dry swallow.  If pt is coughing, he IS ASPIRATING.  Using teach back with video, educated pt to recommendations and reinforced with written strategies/verbal teach and feedback. SLP Visit Diagnosis Dysphagia, oropharyngeal phase (R13.12) Attention and concentration deficit following -- Frontal lobe and executive function deficit following -- Impact on safety and function Moderate aspiration risk   CHL IP TREATMENT RECOMMENDATION 01/02/2018 Treatment Recommendations Therapy as outlined in treatment plan below   Prognosis 01/01/2018 Prognosis for Safe Diet Advancement Guarded Barriers to Reach Goals -- Barriers/Prognosis Comment -- CHL IP DIET RECOMMENDATION 01/02/2018 SLP Diet Recommendations Dysphagia 1 (Puree) solids;Nectar thick liquid;Other (Comment) Liquid  Administration via Cup;Straw Medication Administration Crushed with puree Compensations Slow rate;Small sips/bites Postural Changes Seated upright at 90 degrees;Remain semi-upright after after feeds/meals (Comment)   CHL IP OTHER RECOMMENDATIONS 01/02/2018 Recommended Consults -- Oral Care Recommendations Oral care QID Other Recommendations Order thickener from pharmacy;Have oral suction available   CHL IP FOLLOW UP RECOMMENDATIONS 01/02/2018 Follow up Recommendations Inpatient Rehab;Skilled Nursing facility   [facility] IP FREQUENCY AND DURATION 01/02/2018 Speech Therapy Frequency (ACUTE ONLY) min 1 x/week Treatment Duration 1 week      CHL IP ORAL PHASE 01/02/2018 Oral Phase Impaired Oral - Pudding Teaspoon --  Oral - Pudding Cup -- Oral - Honey Teaspoon -- Oral - Honey Cup -- Oral - Nectar Teaspoon Delayed oral transit;Premature spillage;Reduced posterior propulsion Oral - Nectar Cup Delayed oral transit;Premature spillage Oral - Nectar Straw Decreased bolus cohesion;Premature spillage;Reduced posterior propulsion Oral - Thin Teaspoon Decreased bolus cohesion;Premature spillage;Reduced posterior propulsion Oral - Thin Cup Decreased bolus cohesion;Premature spillage;Reduced posterior propulsion Oral - Thin Straw Decreased bolus cohesion;Premature spillage;Reduced posterior propulsion Oral - Puree Decreased bolus cohesion;Premature spillage;Reduced posterior propulsion Oral - Mech Soft Premature spillage;Impaired mastication;Reduced posterior propulsion Oral - Regular -- Oral - Multi-Consistency -- Oral - Pill -- Oral Phase - Comment --  CHL IP PHARYNGEAL PHASE 01/02/2018 Pharyngeal Phase Impaired Pharyngeal- Pudding Teaspoon -- Pharyngeal -- Pharyngeal- Pudding Cup -- Pharyngeal -- Pharyngeal- Honey Teaspoon -- Pharyngeal -- Pharyngeal- Honey Cup -- Pharyngeal -- Pharyngeal- Nectar Teaspoon Reduced tongue base retraction;Reduced airway/laryngeal closure;Reduced laryngeal elevation Pharyngeal -- Pharyngeal- Nectar Cup Reduced  tongue base retraction;Reduced laryngeal elevation;Reduced airway/laryngeal closure Pharyngeal -- Pharyngeal- Nectar Straw Reduced tongue base retraction;Reduced laryngeal elevation;Reduced airway/laryngeal closure;Reduced pharyngeal peristalsis;Reduced epiglottic inversion Pharyngeal -- Pharyngeal- Thin Teaspoon Reduced tongue base retraction;Delayed swallow initiation-vallecula;Reduced airway/laryngeal closure;Reduced laryngeal elevation;Reduced pharyngeal peristalsis;Reduced epiglottic inversion Pharyngeal -- Pharyngeal- Thin Cup Reduced tongue base retraction;Pharyngeal residue - valleculae;Delayed swallow initiation-pyriform sinuses;Reduced airway/laryngeal closure;Reduced laryngeal elevation;Reduced pharyngeal peristalsis;Reduced epiglottic inversion Pharyngeal -- Pharyngeal- Thin Straw Reduced laryngeal elevation;Reduced airway/laryngeal closure;Reduced tongue base retraction;Penetration/Aspiration during swallow;Moderate aspiration;Delayed swallow initiation-pyriform sinuses;Reduced pharyngeal peristalsis;Reduced epiglottic inversion Pharyngeal Material enters airway, passes BELOW cords and not ejected out despite cough attempt by patient Pharyngeal- Puree Reduced tongue base retraction;Pharyngeal residue - valleculae;Reduced laryngeal elevation;Reduced airway/laryngeal closure;Reduced epiglottic inversion;Reduced pharyngeal peristalsis Pharyngeal -- Pharyngeal- Mechanical Soft Reduced tongue base retraction;Pharyngeal residue - valleculae Pharyngeal -- Pharyngeal- Regular -- Pharyngeal -- Pharyngeal- Multi-consistency -- Pharyngeal -- Pharyngeal- Pill -- Pharyngeal -- Pharyngeal Comment --  No flowsheet data found. Macario Golds 01/02/2018, 2:40 PM  Luanna Salk, MS [facility] SLP Acute Rehab Services Pager [phone] Office [phone]              Assessment/Plan: Diagnosis: Multiple right-sided infarcts. Labs and images (see above) independently reviewed.  Records reviewed and summated  above. Stroke: Continue secondary stroke prophylaxis and Risk Factor Modification listed below:   Antiplatelet therapy:   Blood Pressure Management:  Continue current medication with prn's with permisive HTN per primary team Statin Agent:   Prediabetes management:   Left sided hemiparesis: fit for orthosis to prevent contractures (resting hand splint for day, wrist cock up splint at night, etc) Motor recovery: Fluoxetine  1. Does the need for close, 24 hr/day medical supervision in concert with the patient's rehab needs make it unreasonable for this patient to be served in a less intensive setting? Yes  2. Co-Morbidities requiring supervision/potential complications: CAD (cont meds), DJD, OSA (encourage compliance), h/o DVTs, history of CVA, post stroke dysphagia (advance as tolerated), tachycardia (monitor in accordance with pain and increasing activity), ABLA (repeat labs, transfuse to ensure appropriate perfusion for increased activity tolerance) 3. Due to safety, disease management and patient education, does the patient require 24 hr/day rehab nursing? Yes 4. Does the patient require coordinated care of a physician, rehab nurse, PT (1-2 hrs/day, 5 days/week), OT (1-2 hrs/day, 5 days/week) and SLP (1-2 hrs/day, 5 days/week) to address physical and functional deficits in the context of the above medical diagnosis(es)? Yes Addressing deficits in the following areas: balance, endurance, locomotion, strength, transferring, bowel/bladder control, bathing, dressing, toileting, speech, swallowing and psychosocial support 5. Can the patient actively participate in  an intensive therapy program of at least 3 hrs of therapy per day at least 5 days per week? Yes 6. The potential for patient to make measurable gains while on inpatient rehab is excellent 7. Anticipated functional outcomes upon discharge from inpatient rehab are min assist  with PT, min assist with OT, modified independent with  SLP. 8. Estimated rehab length of stay to reach the above functional goals is: 14-18 days. 9. Anticipated D/C setting: Home 10. Anticipated post D/C treatments: HH therapy and Home excercise program 11. Overall Rehab/Functional Prognosis: good  RECOMMENDATIONS: This patient's condition is appropriate for continued rehabilitative care in the following setting: CIR after completion of medical work-up. Patient has agreed to participate in recommended program. Yes Note that insurance prior authorization may be required for reimbursement for recommended care.  Comment: Rehab Admissions Coordinator to follow up.   I have personally performed a face to face diagnostic evaluation, including, but not limited to relevant history and physical exam findings, of this patient and developed relevant assessment and plan.  Additionally, I have reviewed and concur with the physician assistant's documentation above.   Delice Lesch, MD, ABPMR Bary Leriche, PA-C 01/03/2018

## 2018-01-03 NOTE — Progress Notes (Addendum)
PROGRESS NOTE                                                                                                                                                                                                             Patient Demographics:    [name], is a 77 y.o. male, DOB - [birth year], ZYS:[id number]  Admit date - 01/01/2018   Admitting Physician Rise Patience, MD  Outpatient Primary MD for the patient is Rogers Blocker, MD  LOS - 2  Chief Complaint  Patient presents with  . Code Stroke       Brief Narrative  [name] is a 77 y.o. male with history of CAD status post CABG, hypertension, chronic kidney disease, hyperlipidemia, sleep apnea was found on the floor by patient's wife around 2 AM this morning in the bathroom.  Patient's wife woke up after she heard a sound.  On exam patient was having left facial drooping dysarthria and left-sided weakness.  He was diagnosed with CVA and admitted to the hospital.   Subjective:   Patient in bed, appears comfortable, denies any headache, no fever, no chest pain or pressure, no shortness of breath , no abdominal pain. No focal weakness.   Assessment  & Plan :     1.  Left-sided weakness, dysphagia, dysarthria due to acute right MCA territory infarcts noted on MRI.  CTA head and neck unremarkable, full stroke work-up underway, currently on rectal aspirin, Plavix and statin, statin dose has been increased, neuro on board.  He is due for modified barium swallow along with TEE and loop recorder placement on 01/03/2018.   Lab Results  Component Value Date   HGBA1C 5.8 (H) 01/01/2018   Lab Results  Component Value Date   CHOL 127 01/01/2018   HDL 39 (L) 01/01/2018   LDLCALC 73 01/01/2018   TRIG 73 01/01/2018   CHOLHDL 3.3 01/01/2018    2.  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  Creatinine close to his baseline of 1.4.  Proving with gentle hydration while n.p.o.  3.  Essential hypertension.  Allow for permissive hypertension for stroke.   PRN hydralazine with parameters.  4.  CAD.  No acute issue.  Continue aspirin and statin for now, resume beta-blocker once blood pressure requirements have eased.  5.  Dyslipidemia.  Resume home dose statin and niacin once taking oral.  6.  GERD.  On PPI.  7. HX Gout. Resume allopurinol once taking orally.    Family Communication  :  None  Code Status :  Full  Disposition Plan  :  TBD  Consults  :  Neuro  Procedures  :    [place] -  Preliminary report:  There is no obvious evidence of DVT or SVT noted in the bilateral lower extremities.   MRI - 1. Acute/subacute right MCA territory infarcts involving the right frontal operculum an and diffusely along the right frontal and parietal lobes. The area along the convexity may be in a watershed distribution. 2. Additional confluent white matter changes are present bilaterally, likely reflecting the sequela of chronic microvascular ischemia. 3. Remote lacunar infarcts of the basal ganglia including a remote hemorrhagic infarct of the right thalamus  TEE  TTE Left ventricle: The cavity size was normal. There was moderate concentric hypertrophy. Systolic function was normal. The estimated ejection fraction was in the range of 60% to 65%. Wall motion was normal; there were no regional wall motion abnormalities. Doppler parameters are consistent with abnormal left ventricular relaxation (grade 1 diastolic dysfunction).  Acoustic contrast opacification revealed no evidence of thrombus. - Aortic valve: Valve area (VTI): 2.38 cm^2. Valve area (Vmax):  2.41 cm^2. Valve area (Vmean): 2.4 cm^2. - Left atrium: The atrium was moderately dilated.  CTA NECK: 1. No hemodynamically significant stenosis ICA's. 2. Mild stenosis RIGHT vertebral artery origin. CTA HEAD: 1. No emergent large vessel occlusion. 2. Moderate cerebral artery atherosclerosis. CT PERFUSION: 1. Small acute RIGHT frontal/MCA penumbra though limited by motion artifact   DVT Prophylaxis   :  Lovenox   Lab Results  Component Value Date   PLT 259 01/03/2018    Diet :  Diet Order            Diet NPO time specified  Diet effective now               Inpatient Medications Scheduled Meds: . allopurinol  200 mg Oral Daily  . aspirin EC  81 mg Oral Daily  . clopidogrel  75 mg Oral Daily  . enoxaparin (LOVENOX) injection  40 mg Subcutaneous Q24H  . metoprolol tartrate  25 mg Oral BID  . niacin  500 mg Oral QHS  . pantoprazole  40 mg Oral Daily  . rosuvastatin  20 mg Oral q1800  . tamsulosin  0.4 mg Oral Daily   Continuous Infusions: . lactated ringers     PRN Meds:.acetaminophen **OR** acetaminophen (TYLENOL) oral liquid 160 mg/5 mL **OR** acetaminophen, hydrALAZINE, RESOURCE THICKENUP CLEAR, senna-docusate  Antibiotics  :   Anti-infectives (From admission, onward)   None          Objective:   Vitals:   01/02/18 2123 01/02/18 2300 01/03/18 0400 01/03/18 0756  BP: (!) 188/107 (!) 180/93 (!) 179/95 (!) 193/95  Pulse:  61 (!) 53 77  Resp:  18 18 19   Temp:  98.8 F (37.1 C) 98.5 F (36.9 C) 98.4 F (36.9 C)  TempSrc:  Oral Oral Oral  SpO2:  96% 96% 98%  Weight:      Height:        Wt Readings from Last 3 Encounters:  01/01/18 127.5 kg  11/07/17 104.3 kg  11/02/17 127.7 kg     Intake/Output Summary (Last 24 hours) at 01/03/2018 0957 Last data filed at 01/03/2018 0900 Gross per 24 hour  Intake 914.43 ml  Output 2000 ml  Net -1085.57 ml     Physical Exam  Awake ,  left arm 0-1/5, left leg 3/5, right-sided extremities 5/5, does have left-sided neglect and  left-sided nasolabial flattening Crane.AT,PERRAL Supple Neck,No JVD, No cervical lymphadenopathy appriciated.  Symmetrical Chest wall movement, Good air movement bilaterally, CTAB RRR,No Gallops, Rubs or new Murmurs, No Parasternal Heave +ve B.Sounds, Abd Soft, No tenderness, No organomegaly appriciated, No rebound - guarding or rigidity. No Cyanosis, Clubbing or edema, No new Rash or  bruise       Data Review:    CBC Recent Labs  Lab 01/01/18 0308 01/01/18 0315 01/02/18 0449 01/03/18 0538  WBC 8.2  --  9.6 9.6  HGB 12.2* 12.6* 12.3* 12.7*  HCT 41.5 37.0* 38.4* 40.1  PLT 279  --  279 259  MCV 89.1  --  84.0 85.3  MCH 26.2  --  26.9 27.0  MCHC 29.4*  --  32.0 31.7  RDW 16.2*  --  15.9* 16.3*  LYMPHSABS 3.2  --   --   --   MONOABS 0.9  --   --   --   EOSABS 0.3  --   --   --   BASOSABS 0.0  --   --   --     Chemistries  Recent Labs  Lab 01/01/18 0308 01/01/18 0315 01/02/18 0614 01/03/18 0538  NA 141 144 141 143  K 4.2 4.2 3.6 3.9  CL 112* 111 110 113*  CO2 23  --  23 27  GLUCOSE 104* 103* 119* 97  BUN 19 21 14 14   CREATININE 1.46* 1.60* 1.30* 1.36*  CALCIUM 9.7  --  9.7 9.9  MG  --   --  2.1  --   AST 19  --   --   --   ALT 14  --   --   --   ALKPHOS 52  --   --   --   BILITOT 0.3  --   --   --    ------------------------------------------------------------------------------------------------------------------ Recent Labs    01/01/18 0308  CHOL 127  HDL 39*  LDLCALC 73  TRIG 73  CHOLHDL 3.3    Lab Results  Component Value Date   HGBA1C 5.8 (H) 01/01/2018   ------------------------------------------------------------------------------------------------------------------ No results for input(s): TSH, T4TOTAL, T3FREE, THYROIDAB in the last 72 hours.  Invalid input(s): FREET3 ------------------------------------------------------------------------------------------------------------------ No results for input(s): VITAMINB12, FOLATE, FERRITIN, TIBC, IRON, RETICCTPCT in the last 72 hours.  Coagulation profile Recent Labs  Lab 01/01/18 0308  INR 1.06    No results for input(s): DDIMER in the last 72 hours.  Cardiac Enzymes No results for input(s): CKMB, TROPONINI, MYOGLOBIN in the last 168 hours.  Invalid input(s):  CK ------------------------------------------------------------------------------------------------------------------    Component Value Date/Time   BNP 59.6 12/09/2016 1209    Micro Results No results found for this or any previous visit (from the past 240 hour(s)).  Radiology Reports Ct Angio Head W Or Wo Contrast  Result Date: 01/01/2018 CLINICAL DATA:  Follow up code stroke. LEFT-sided weakness. History of stroke, hypertension and hyperlipidemia. EXAM: CT ANGIOGRAPHY HEAD AND NECK CT PERFUSION BRAIN TECHNIQUE: Multidetector CT imaging of the head and neck was performed using the standard protocol during bolus administration of intravenous contrast. Multiplanar CT image reconstructions and MIPs were obtained to evaluate the vascular anatomy. Carotid stenosis measurements (when applicable) are obtained utilizing NASCET criteria, using the distal internal carotid diameter as the denominator. Multiphase CT imaging of the brain was performed following IV bolus contrast injection. Subsequent parametric perfusion maps were calculated using RAPID software. CONTRAST:  150mL ISOVUE-370 IOPAMIDOL (ISOVUE-370) INJECTION 76% COMPARISON:  CT HEAD January 01, 2018 FINDINGS: CTA  NECK FINDINGS: AORTIC ARCH: Normal appearance of the thoracic arch, normal branch pattern. Mild calcific atherosclerosis aortic arch. The origins of the innominate, left Common carotid artery and subclavian artery are widely patent. RIGHT CAROTID SYSTEM: Common carotid artery is patent. Calcific atherosclerosis resulting in less than 50% stenosis by NASCET criteria. Normal appearance of the internal carotid artery. LEFT CAROTID SYSTEM: Common carotid artery is patent. Mild calcific atherosclerosis of the carotid bifurcation without hemodynamically significant stenosis by NASCET criteria. Normal appearance of the internal carotid artery. VERTEBRAL ARTERIES:Left vertebral artery is dominant. Calcific atherosclerosis resulting in mild stenosis  RIGHT vertebral artery. Degenerative changes cervical spine resultant mild extrinsic compression. SKELETON: No acute osseous process though bone windows have not been submitted. Status post median sternotomy. 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 Patient is edentulous. OTHER NECK: Soft tissues of the neck are nonacute though, not tailored for evaluation. UPPER CHEST: Included lung apices are clear. Mild centrilobular emphysema. No superior mediastinal lymphadenopathy. CTA HEAD FINDINGS: ANTERIOR CIRCULATION: Patent cervical internal carotid arteries, petrous, cavernous and supra clinoid internal carotid arteries. Patent anterior communicating artery. Patent anterior and middle cerebral arteries, moderate luminal irregularity compatible with atherosclerosis. No large vessel occlusion, significant stenosis, contrast extravasation or aneurysm. POSTERIOR CIRCULATION: Patent vertebral arteries, vertebrobasilar junction and basilar artery, as well as main branch vessels. Patent posterior cerebral arteries, moderate luminal irregularity compatible with atherosclerosis. No large vessel occlusion, significant stenosis, contrast extravasation or aneurysm. VENOUS SINUSES: Major dural venous sinuses are patent though not tailored for evaluation on this angiographic examination. ANATOMIC VARIANTS: None. DELAYED PHASE: Not performed. MIP images reviewed. CT Brain Perfusion Findings-motion degraded examination: CBF (<30%) Volume: 27mL Perfusion (Tmax>6.0s) volume: 68mL Mismatch Volume: 37mL Infarction Location:RIGHT frontal lobe. IMPRESSION: CTA NECK: 1. No hemodynamically significant stenosis ICA's. 2. Mild stenosis RIGHT vertebral artery origin. CTA HEAD: 1. No emergent large vessel occlusion. 2. Moderate cerebral artery atherosclerosis. CT PERFUSION: 1. Small acute RIGHT frontal/MCA penumbra though limited by motion artifact. Critical Value/emergent results text paged to Dr.ERIC  [facility] via AMION secure system on 01/01/2018 at 3:55 am, including interpreting physician's phone number. Aortic Atherosclerosis (ICD10-I70.0). Emphysema (ICD10-J43.9). Electronically Signed   By: Elon Alas M.D.   On: 01/01/2018 03:56   Ct Angio Neck W Or Wo Contrast  Result Date: 01/01/2018 CLINICAL DATA:  Follow up code stroke. LEFT-sided weakness. History of stroke, hypertension and hyperlipidemia. EXAM: CT ANGIOGRAPHY HEAD AND NECK CT PERFUSION BRAIN TECHNIQUE: Multidetector CT imaging of the head and neck was performed using the standard protocol during bolus administration of intravenous contrast. Multiplanar CT image reconstructions and MIPs were obtained to evaluate the vascular anatomy. Carotid stenosis measurements (when applicable) are obtained utilizing NASCET criteria, using the distal internal carotid diameter as the denominator. Multiphase CT imaging of the brain was performed following IV bolus contrast injection. Subsequent parametric perfusion maps were calculated using RAPID software. CONTRAST:  168mL ISOVUE-370 IOPAMIDOL (ISOVUE-370) INJECTION 76% COMPARISON:  CT HEAD January 01, 2018 FINDINGS: CTA NECK FINDINGS: AORTIC ARCH: Normal appearance of the thoracic arch, normal branch pattern. Mild calcific atherosclerosis aortic arch. The origins of the innominate, left Common carotid artery and subclavian artery are widely patent. RIGHT CAROTID SYSTEM: Common carotid artery is patent. Calcific atherosclerosis resulting in less than 50% stenosis by NASCET criteria. Normal appearance of the internal carotid artery. LEFT CAROTID SYSTEM: Common carotid artery is patent. Mild calcific atherosclerosis of the carotid bifurcation without hemodynamically significant stenosis by NASCET criteria. Normal appearance of the internal carotid artery. VERTEBRAL ARTERIES:Left  vertebral artery is dominant. Calcific atherosclerosis resulting in mild stenosis RIGHT vertebral artery. Degenerative changes  cervical spine resultant mild extrinsic compression. SKELETON: No acute osseous process though bone windows have not been submitted. Status post median sternotomy. 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 Patient is edentulous. OTHER NECK: Soft tissues of the neck are nonacute though, not tailored for evaluation. UPPER CHEST: Included lung apices are clear. Mild centrilobular emphysema. No superior mediastinal lymphadenopathy. CTA HEAD FINDINGS: ANTERIOR CIRCULATION: Patent cervical internal carotid arteries, petrous, cavernous and supra clinoid internal carotid arteries. Patent anterior communicating artery. Patent anterior and middle cerebral arteries, moderate luminal irregularity compatible with atherosclerosis. No large vessel occlusion, significant stenosis, contrast extravasation or aneurysm. POSTERIOR CIRCULATION: Patent vertebral arteries, vertebrobasilar junction and basilar artery, as well as main branch vessels. Patent posterior cerebral arteries, moderate luminal irregularity compatible with atherosclerosis. No large vessel occlusion, significant stenosis, contrast extravasation or aneurysm. VENOUS SINUSES: Major dural venous sinuses are patent though not tailored for evaluation on this angiographic examination. ANATOMIC VARIANTS: None. DELAYED PHASE: Not performed. MIP images reviewed. CT Brain Perfusion Findings-motion degraded examination: CBF (<30%) Volume: 55mL Perfusion (Tmax>6.0s) volume: 41mL Mismatch Volume: 13mL Infarction Location:RIGHT frontal lobe. IMPRESSION: CTA NECK: 1. No hemodynamically significant stenosis ICA's. 2. Mild stenosis RIGHT vertebral artery origin. CTA HEAD: 1. No emergent large vessel occlusion. 2. Moderate cerebral artery atherosclerosis. CT PERFUSION: 1. Small acute RIGHT frontal/MCA penumbra though limited by motion artifact. Critical Value/emergent results text paged to Dr.ERIC [facility] via AMION secure system on 01/01/2018 at  3:55 am, including interpreting physician's phone number. Aortic Atherosclerosis (ICD10-I70.0). Emphysema (ICD10-J43.9). Electronically Signed   By: Elon Alas M.D.   On: 01/01/2018 03:56   Mr Brain Wo Contrast  Result Date: 01/01/2018 CLINICAL DATA:  Left-sided weakness. Focal neuro deficit of greater than 6 hours. EXAM: MRI HEAD WITHOUT CONTRAST TECHNIQUE: Multiplanar, multiecho pulse sequences of the brain and surrounding structures were obtained without intravenous contrast. COMPARISON:  CT head without contrast 01/01/2018. CTA head and neck 01/01/2018. FINDINGS: Brain: The diffusion-weighted images demonstrate a confluent nonhemorrhagic infarct in the right frontal operculum. Additional scattered areas of nonhemorrhagic infarct are present over the right frontal and parietal lobe as well as the right occipital lobe. The pattern follows a watershed distribution. T2 signal changes are associated with the areas of restricted diffusion. Additional confluent periventricular T2 signal changes are present bilaterally. Ventricles are of normal size. No significant extra-axial fluid collection present. A remote hemorrhagic lacunar infarct is present the right thalamus or posterior limb internal capsule. There is a remote hemorrhage in the anterior left frontal lobe. Vascular: Flow is present in the major intracranial arteries. Skull and upper cervical spine: The craniocervical junction is normal. Upper cervical spine is unremarkable. Marrow signal is somewhat depressed. This likely corresponds with anemia. Sinuses/Orbits: The paranasal sinuses and mastoid air cells are clear. Globes and orbits are within normal limits. IMPRESSION: 1. Acute/subacute right MCA territory infarcts involving the right frontal operculum an and diffusely along the right frontal and parietal lobes. The area along the convexity may be in a watershed distribution. 2. Additional confluent white matter changes are present bilaterally,  likely reflecting the sequela of chronic microvascular ischemia. 3. Remote lacunar infarcts of the basal ganglia including a remote hemorrhagic infarct of the right thalamus. Electronically Signed   By: San Morelle M.D.   On: 01/01/2018 10:20   Ct C-spine No Charge  Result Date: 01/01/2018 CLINICAL DATA:  Mechanical fall. EXAM: CT CERVICAL SPINE WITHOUT  CONTRAST TECHNIQUE: Reformatted CT imaging of the cervical spine was performed from CT angiogram head and neck. Multiplanar CT image reconstructions were also generated. COMPARISON:  None. FINDINGS: ALIGNMENT: Straightened lordosis.  Vertebral bodies in alignment. SKULL BASE AND VERTEBRAE: Cervical vertebral bodies and posterior elements are intact. Moderate C4-5, moderate to severe C6-7 disc height loss, mild at C5-6. Multilevel uncovertebral hypertrophy and endplate spurring. No destructive bony lesions. 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 Calcified craniocervical ligaments. C5-6 calcified ligamentum flavum. SOFT TISSUES AND SPINAL CANAL: Nonacute. Nuchal ligament calcification. DISC LEVELS: Moderate canal stenosis C5-6 and C6-7. Moderate RIGHT C4-5, RIGHT C5-6 and bilateral C6-7 neural foraminal narrowing. UPPER CHEST: Lung apices are clear. OTHER: None. IMPRESSION: 1. No fracture or malalignment. 2. Moderate canal stenosis C5-6 and C6-7. 3. Moderate C4-5 through C6-7 neural foraminal narrowing. Electronically Signed   By: Elon Alas M.D.   On: 01/01/2018 04:18   Ct Cerebral Perfusion W Contrast  Result Date: 01/01/2018 CLINICAL DATA:  Follow up code stroke. LEFT-sided weakness. History of stroke, hypertension and hyperlipidemia. EXAM: CT ANGIOGRAPHY HEAD AND NECK CT PERFUSION BRAIN TECHNIQUE: Multidetector CT imaging of the head and neck was performed using the standard protocol during bolus administration of intravenous contrast. Multiplanar CT image reconstructions and MIPs were obtained to evaluate the vascular anatomy.  Carotid stenosis measurements (when applicable) are obtained utilizing NASCET criteria, using the distal internal carotid diameter as the denominator. Multiphase CT imaging of the brain was performed following IV bolus contrast injection. Subsequent parametric perfusion maps were calculated using RAPID software. CONTRAST:  126mL ISOVUE-370 IOPAMIDOL (ISOVUE-370) INJECTION 76% COMPARISON:  CT HEAD January 01, 2018 FINDINGS: CTA NECK FINDINGS: AORTIC ARCH: Normal appearance of the thoracic arch, normal branch pattern. Mild calcific atherosclerosis aortic arch. The origins of the innominate, left Common carotid artery and subclavian artery are widely patent. RIGHT CAROTID SYSTEM: Common carotid artery is patent. Calcific atherosclerosis resulting in less than 50% stenosis by NASCET criteria. Normal appearance of the internal carotid artery. LEFT CAROTID SYSTEM: Common carotid artery is patent. Mild calcific atherosclerosis of the carotid bifurcation without hemodynamically significant stenosis by NASCET criteria. Normal appearance of the internal carotid artery. VERTEBRAL ARTERIES:Left vertebral artery is dominant. Calcific atherosclerosis resulting in mild stenosis RIGHT vertebral artery. Degenerative changes cervical spine resultant mild extrinsic compression. SKELETON: No acute osseous process though bone windows have not been submitted. Status post median sternotomy. 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 Patient is edentulous. OTHER NECK: Soft tissues of the neck are nonacute though, not tailored for evaluation. UPPER CHEST: Included lung apices are clear. Mild centrilobular emphysema. No superior mediastinal lymphadenopathy. CTA HEAD FINDINGS: ANTERIOR CIRCULATION: Patent cervical internal carotid arteries, petrous, cavernous and supra clinoid internal carotid arteries. Patent anterior communicating artery. Patent anterior and middle cerebral arteries, moderate luminal  irregularity compatible with atherosclerosis. No large vessel occlusion, significant stenosis, contrast extravasation or aneurysm. POSTERIOR CIRCULATION: Patent vertebral arteries, vertebrobasilar junction and basilar artery, as well as main branch vessels. Patent posterior cerebral arteries, moderate luminal irregularity compatible with atherosclerosis. No large vessel occlusion, significant stenosis, contrast extravasation or aneurysm. VENOUS SINUSES: Major dural venous sinuses are patent though not tailored for evaluation on this angiographic examination. ANATOMIC VARIANTS: None. DELAYED PHASE: Not performed. MIP images reviewed. CT Brain Perfusion Findings-motion degraded examination: CBF (<30%) Volume: 65mL Perfusion (Tmax>6.0s) volume: 73mL Mismatch Volume: 68mL Infarction Location:RIGHT frontal lobe. IMPRESSION: CTA NECK: 1. No hemodynamically significant stenosis ICA's. 2. Mild stenosis RIGHT vertebral artery origin. CTA HEAD: 1. No emergent large  vessel occlusion. 2. Moderate cerebral artery atherosclerosis. CT PERFUSION: 1. Small acute RIGHT frontal/MCA penumbra though limited by motion artifact. Critical Value/emergent results text paged to Dr.ERIC [facility] via AMION secure system on 01/01/2018 at 3:55 am, including interpreting physician's phone number. Aortic Atherosclerosis (ICD10-I70.0). Emphysema (ICD10-J43.9). Electronically Signed   By: Elon Alas M.D.   On: 01/01/2018 03:56   Dg Swallowing Func-speech Pathology  Result Date: 01/02/2018 Objective Swallowing Evaluation: Type of Study: MBS-Modified Barium Swallow Study  Patient Details Name: RUDOLPH DAOUST MRN: [id number] Date of Birth: [birth year] Today's Date: 01/02/2018 Time: SLP Start Time (ACUTE ONLY): 2505 -SLP Stop Time (ACUTE ONLY): 0935 SLP Time Calculation (min) (ACUTE ONLY): 31 min Past Medical History: Past Medical History: Diagnosis Date . CAD (coronary artery disease)   s/p cath in 2005 and CABG x4 . Cataract  . DJD (degenerative joint  disease)  . DJD (degenerative joint disease)  . Fluid retention   mild . H/O: gout  . History of blood clots  . HTN (hypertension)  . Hyperlipidemia  . Hyperlipidemia  . Myocardial infarction ([place]) 2009 . Obesity   with reduction . OSA (obstructive sleep apnea)   AHI 98/hr . Renal insufficiency  . Sleep apnea  . Stroke [facility]) 2000 Past Surgical History: Past Surgical History: Procedure Laterality Date . CORONARY ARTERY BYPASS GRAFT  2005  LIMA to LAD, SVG to OM1 and OM 2, RCA.  [name] left inguinal hernia repair   . TEE WITHOUT CARDIOVERSION N/A 12/11/2016  Procedure: TRANSESOPHAGEAL ECHOCARDIOGRAM (TEE);  Surgeon: [name], MD;  Location[facility] ENDOSCOPY;  Service: Cardiovascular;  Laterality: N/A; HPI: pt is a 77 yo adm to [facility] after being found on the floor.  pmh + for cva, cad s/p cabg, cervical spondylosis, ckd, htn, sleep apnea.  pt with left facial droop and dysarthria as well as left sided weakness.  speech and swallow eval ordered.  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   Subjective: pt awake in chair Assessment / Plan / Recommendation [facility] IP CLINICAL IMPRESSIONS 01/02/2018 Clinical Impression Pt presents with moderate oropharyngeal dysphagia with sensorimotor deficits.  Pt with decreased oral coordination resulting in premature spillage of barium into phayrnx.  Delayed swallow and decreased laryngeal closure results in significant aspiration of thin via straw with cough response.  Pt did not clear aspirates with cough.  No aspiration or penetration with thin via tsp, nectar, puree, cracker but mild pharyngeal residuals mixed with secretions.  Pt with significantly compromised mastication of cracker *dentures at home which he uses for po* therefore recommend dys1/nectar and tsps thin water/[place] with strict precautions.  Of note, pt did become sleepy during MBS and required verbal/tactile dues to remain alert - therefore adequacy of nutrition may be worrisome.   STRICT precautions needed.    Medicine with puree *crushed if large*, start meals with liquids and intermittent dry swallow.  If pt is coughing, he IS ASPIRATING.  Using teach back with video, educated pt to recommendations and reinforced with written strategies/verbal teach and feedback. SLP Visit Diagnosis Dysphagia, oropharyngeal phase (R13.12) Attention and concentration deficit following -- Frontal lobe and executive function deficit following -- Impact on safety and function Moderate aspiration risk   [facility] IP TREATMENT RECOMMENDATION 01/02/2018 Treatment Recommendations Therapy as outlined in treatment plan below   Prognosis 01/01/2018 Prognosis for Safe Diet Advancement Guarded Barriers to Reach Goals -- Barriers/Prognosis Comment -- [facility] IP DIET RECOMMENDATION 01/02/2018 SLP Diet Recommendations Dysphagia 1 (  Puree) solids;Nectar thick liquid;Other (Comment) Liquid Administration via Cup;Straw Medication Administration Crushed with puree Compensations Slow rate;Small sips/bites Postural Changes Seated upright at 90 degrees;Remain semi-upright after after feeds/meals (Comment)   [facility] IP OTHER RECOMMENDATIONS 01/02/2018 Recommended Consults -- Oral Care Recommendations Oral care QID Other Recommendations Order thickener from pharmacy;Have oral suction available   [facility] IP FOLLOW UP RECOMMENDATIONS 01/02/2018 Follow up Recommendations Inpatient Rehab;Skilled Nursing facility   [facility] IP FREQUENCY AND DURATION 01/02/2018 Speech Therapy Frequency (ACUTE ONLY) min 1 x/week Treatment Duration 1 week      [facility] IP ORAL PHASE 01/02/2018 Oral Phase Impaired Oral - Pudding Teaspoon -- Oral - Pudding Cup -- Oral - Honey Teaspoon -- Oral - Honey Cup -- Oral - Nectar Teaspoon Delayed oral transit;Premature spillage;Reduced posterior propulsion Oral - Nectar Cup Delayed oral transit;Premature spillage Oral - Nectar Straw Decreased bolus cohesion;Premature spillage;Reduced posterior propulsion Oral - Thin Teaspoon Decreased bolus  cohesion;Premature spillage;Reduced posterior propulsion Oral - Thin Cup Decreased bolus cohesion;Premature spillage;Reduced posterior propulsion Oral - Thin Straw Decreased bolus cohesion;Premature spillage;Reduced posterior propulsion Oral - Puree Decreased bolus cohesion;Premature spillage;Reduced posterior propulsion Oral - Mech Soft Premature spillage;Impaired mastication;Reduced posterior propulsion Oral - Regular -- Oral - Multi-Consistency -- Oral - Pill -- Oral Phase - Comment --  [facility] IP PHARYNGEAL PHASE 01/02/2018 Pharyngeal Phase Impaired Pharyngeal- Pudding Teaspoon -- Pharyngeal -- Pharyngeal- Pudding Cup -- Pharyngeal -- Pharyngeal- Honey Teaspoon -- Pharyngeal -- Pharyngeal- Honey Cup -- Pharyngeal -- Pharyngeal- Nectar Teaspoon Reduced tongue base retraction;Reduced airway/laryngeal closure;Reduced laryngeal elevation Pharyngeal -- Pharyngeal- Nectar Cup Reduced tongue base retraction;Reduced laryngeal elevation;Reduced airway/laryngeal closure Pharyngeal -- Pharyngeal- Nectar Straw Reduced tongue base retraction;Reduced laryngeal elevation;Reduced airway/laryngeal closure;Reduced pharyngeal peristalsis;Reduced epiglottic inversion Pharyngeal -- Pharyngeal- Thin Teaspoon Reduced tongue base retraction;Delayed swallow initiation-vallecula;Reduced airway/laryngeal closure;Reduced laryngeal elevation;Reduced pharyngeal peristalsis;Reduced epiglottic inversion Pharyngeal -- Pharyngeal- Thin Cup Reduced tongue base retraction;Pharyngeal residue - valleculae;Delayed swallow initiation-pyriform sinuses;Reduced airway/laryngeal closure;Reduced laryngeal elevation;Reduced pharyngeal peristalsis;Reduced epiglottic inversion Pharyngeal -- Pharyngeal- Thin Straw Reduced laryngeal elevation;Reduced airway/laryngeal closure;Reduced tongue base retraction;Penetration/Aspiration during swallow;Moderate aspiration;Delayed swallow initiation-pyriform sinuses;Reduced pharyngeal peristalsis;Reduced epiglottic inversion  Pharyngeal Material enters airway, passes BELOW cords and not ejected out despite cough attempt by patient Pharyngeal- Puree Reduced tongue base retraction;Pharyngeal residue - valleculae;Reduced laryngeal elevation;Reduced airway/laryngeal closure;Reduced epiglottic inversion;Reduced pharyngeal peristalsis Pharyngeal -- Pharyngeal- Mechanical Soft Reduced tongue base retraction;Pharyngeal residue - valleculae Pharyngeal -- Pharyngeal- Regular -- Pharyngeal -- Pharyngeal- Multi-consistency -- Pharyngeal -- Pharyngeal- Pill -- Pharyngeal -- Pharyngeal Comment --  No flowsheet data found. Macario Golds 01/02/2018, 2:40 PM  Luanna Salk, MS [facility] SLP Acute Rehab Services Pager [phone] Office [phone]             Ct Head Code Stroke Wo Contrast  Result Date: 01/01/2018 CLINICAL DATA:  Code stroke. LEFT-sided weakness. History of stroke, hypertension and hyperlipidemia. EXAM: CT HEAD WITHOUT CONTRAST TECHNIQUE: Contiguous axial images were obtained from the base of the skull through the vertex without intravenous contrast. COMPARISON:  MRI head March 17, 2013 FINDINGS: BRAIN: No intraparenchymal hemorrhage, mass effect nor midline shift. Old RIGHT cerebellar infarcts. Confluent pontine and supratentorial white matter hypodensities. Old RIGHT basal ganglia infarcts with ex vacuo dilatation RIGHT frontal horn of the lateral ventricle. No acute large vascular territory infarcts. No abnormal extra-axial fluid collections. Basal cisterns are patent. VASCULAR: Moderate calcific atherosclerosis of the carotid siphons. SKULL: No skull fracture. No significant scalp soft tissue swelling. SINUSES/ORBITS: Mild paranasal sinus mucosal thickening. Mastoid air cells are well aerated.LEFT buphthalmos and ocular lens implant. OTHER: Patient  is edentulous. ASPECTS [facility] Stroke Program Early CT Score) - Ganglionic level infarction (caudate, lentiform nuclei, internal capsule, insula, M1-M3 cortex): 7 -  Supraganglionic infarction (M4-M6 cortex): 3 Total score (0-10 with 10 being normal): 10 IMPRESSION: 1. No acute intracranial process. 2. ASPECTS is 10. 3. Moderate to severe chronic small vessel ischemic changes. 4. 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 5. Critical Value/emergent results text paged to [place], Neurology via [place] secure system on 01/01/2018 at 3:27 am, including interpreting physician's phone number. Electronically Signed   By: Elon Alas M.D.   On: 01/01/2018 03:27    Time Spent in minutes  30   Lala Lund M.D on 01/03/2018 at 9:57 AM  To page go to [email or web address] - password [facility]

## 2018-01-03 NOTE — Consult Note (Addendum)
ELECTROPHYSIOLOGY CONSULT NOTE  Patient ID: CHANCY SMIGIEL MRN: [id number], DOB/AGE: 06/24/1940   Admit date: 01/01/2018 Date of Consult: 01/03/2018  Primary Physician: Rogers Blocker, MD Primary Cardiologist: Gwenlyn Found Reason for Consultation: Cryptogenic stroke; recommendations regarding Implantable Loop Recorder  History of Present Illness EP has been asked to evaluate Jabez T [facility] for placement of an implantable loop recorder to monitor for atrial fibrillation by Dr Erlinda Hong.  The patient was admitted on 01/01/2018 with left facial droop, and left hemiplegia.   Imaging demonstrated right MCA territory infarct felt to be embolic 2/2 unknown source.  he has undergone workup for stroke including echocardiogram and carotid dopplers.  The patient has been monitored on telemetry which has demonstrated sinus rhythm with no arrhythmias.  Inpatient stroke work-up is to be completed with a TEE.   Echocardiogram this admission demonstrated EF 73-53%, grade 1 diastolic dysfunction, LA 45.  Lab work is reviewed.  Prior to admission, the patient denies chest pain, shortness of breath, dizziness, palpitations, or syncope.  They are recovering from their stroke with plans to go to CIR at discharge.   Past Medical History:  Diagnosis Date  . CAD (coronary artery disease)    s/p cath in 2005 and CABG x4  . Cataract   . DJD (degenerative joint disease)   . DJD (degenerative joint disease)   . Fluid retention    mild  . H/O: gout   . History of blood clots   . HTN (hypertension)   . Hyperlipidemia   . Hyperlipidemia   . Myocardial infarction ([place]) 2009  . Obesity    with reduction  . OSA (obstructive sleep apnea)    AHI 98/hr  . Renal insufficiency   . Sleep apnea   . Stroke [facility]) 2000     Surgical History:  Past Surgical History:  Procedure Laterality Date  . CORONARY ARTERY BYPASS GRAFT  2005   LIMA to LAD, SVG to OM1 and OM 2, RCA.   [name] left inguinal hernia repair    . TEE WITHOUT  CARDIOVERSION N/A 12/11/2016   Procedure: TRANSESOPHAGEAL ECHOCARDIOGRAM (TEE);  Surgeon: [name], MD;  Location: [facility] ENDOSCOPY;  Service: Cardiovascular;  Laterality: N/A;     Medications Prior to Admission  Medication Sig Dispense Refill Last Dose  . allopurinol (ZYLOPRIM) 100 MG tablet Take 200 mg by mouth daily.    12/31/2017 at Unknown time  . aspirin EC 81 MG tablet Take 81 mg by mouth daily.   12/31/2017 at Unknown time  . furosemide (LASIX) 20 MG tablet Take 20 mg by mouth 2 (two) times daily.     12/31/2017 at Unknown time  . lovastatin (MEVACOR) 20 MG tablet TAKE ONE TABLET BY MOUTH TWICE DAILY. MUST HAVE OFFICE VISIT BEFORE FURTHER REFILL. (Patient taking differently: Take 20 mg by mouth 2 (two) times daily. ) 60 tablet 6 12/31/2017 at Unknown time  . metoprolol tartrate (LOPRESSOR) 25 MG tablet TAKE ONE TABLET BY MOUTH TWICE DAILY (Patient taking differently: Take 25 mg by mouth 2 (two) times daily. ) 30 tablet 0 12/31/2017 at 1800  . montelukast (SINGULAIR) 10 MG tablet Take 10 mg by mouth at bedtime.   12/31/2017 at Unknown time  . niacin (NIASPAN) 500 MG CR tablet Take 500 mg by mouth at bedtime.     12/31/2017 at Unknown time  . omeprazole (PRILOSEC) 40 MG capsule Take 40 mg by mouth daily.   12/31/2017 at Unknown time  . potassium chloride (K-DUR) 10  MEQ tablet Take 10 mEq by mouth 2 (two) times daily.   12/31/2017 at Unknown time  . rosuvastatin (CRESTOR) 10 MG tablet Take 10 mg by mouth at bedtime.   12/31/2017 at Unknown time  . amLODipine (NORVASC) 5 MG tablet TAKE ONE TABLET BY MOUTH ONCE DAILY. (Patient not taking: Reported on 01/01/2018) 30 tablet 3 Not Taking at Unknown time  . phosphorus (K PHOS NEUTRAL) 155-852-130 MG tablet Take 2 tablets (500 mg total) by mouth 3 (three) times daily. (Patient not taking: Reported on 01/01/2018) 3 tablet 0 Not Taking at Unknown time    Inpatient Medications:  . allopurinol  200 mg Oral Daily  . aspirin EC  81 mg Oral Daily  .  clopidogrel  75 mg Oral Daily  . enoxaparin (LOVENOX) injection  40 mg Subcutaneous Q24H  . metoprolol tartrate  25 mg Oral BID  . niacin  500 mg Oral QHS  . pantoprazole  40 mg Oral Daily  . rosuvastatin  20 mg Oral q1800  . tamsulosin  0.4 mg Oral Daily    Allergies:  Allergies  Allergen Reactions  . Ramipril Cough    Social History   Socioeconomic History  . Marital status: Married    Spouse name: [name]  . Number of children: Not on file  . Years of education: Not on file  . Highest education level: Not on file  Occupational History  . Not on file  Social Needs  . Financial resource strain: Not on file  . Food insecurity:    Worry: Not on file    Inability: Not on file  . Transportation needs:    Medical: Not on file    Non-medical: Not on file  Tobacco Use  . Smoking status: Former Smoker    Types: Cigars  . Smokeless tobacco: Never Used  . Tobacco comment: used to smoke cigars, 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, none since  Substance and Sexual Activity  . Alcohol use: No  . Drug use: No  . Sexual activity: Not on file  Lifestyle  . Physical activity:    Days per week: Not on file    Minutes per session: Not on file  . Stress: Not on file  Relationships  . Social connections:    Talks on phone: Not on file    Gets together: Not on file    Attends religious service: Not on file    Active member of club or organization: Not on file    Attends meetings of clubs or organizations: Not on file    Relationship status: Not on file  . Intimate partner violence:    Fear of current or ex partner: Not on file    Emotionally abused: Not on file    Physically abused: Not on file    Forced sexual activity: Not on file  Other Topics Concern  . Not on file  Social History Narrative  . Not on file     Family History  Problem Relation Age of Onset  . Gout Mother   . Stroke Mother   . Coronary artery disease Mother   . Hypertension Mother   . Arthritis Mother   .  Coronary artery disease Father   . Arthritis Father   . Gout Father   . Stroke Father   . Hypertension Father   . Coronary artery disease Brother   . Arthritis Brother   . Gout Brother   . Stroke Brother   . Hypertension Brother   .  Coronary artery disease Brother   . Arthritis Brother   . Gout Brother   . Stroke Brother   . Hypertension Brother   . Coronary artery disease Unknown        significant in multiple family members      Review of Systems: All other systems reviewed and are otherwise negative except as noted above.  Physical Exam: Vitals:   01/02/18 2123 01/02/18 2300 01/03/18 0400 01/03/18 0756  BP: (!) 188/107 (!) 180/93 (!) 179/95 (!) 193/95  Pulse:  61 (!) 53 77  Resp:  18 18 19   Temp:  98.8 F (37.1 C) 98.5 F (36.9 C) 98.4 F (36.9 C)  TempSrc:  Oral Oral Oral  SpO2:  96% 96% 98%  Weight:      Height:        GEN- The patient is well appearing, alert and oriented x 3 today.   Head- normocephalic, atraumatic Eyes-  Sclera clear, conjunctiva pink Ears- hearing intact Oropharynx- clear Neck- supple Lungs- Clear to ausculation bilaterally, normal work of breathing Heart- Regular rate and rhythm  GI- soft, NT, ND, + BS Extremities- no clubbing, cyanosis, or edema MS- no significant deformity or atrophy Skin- no rash or lesion Psych- euthymic mood, full affect   Labs:   Lab Results  Component Value Date   WBC 9.6 01/03/2018   HGB 12.7 (L) 01/03/2018   HCT 40.1 01/03/2018   MCV 85.3 01/03/2018   PLT 259 01/03/2018    Recent Labs  Lab 01/01/18 0308  01/03/18 0538  NA 141   < > 143  K 4.2   < > 3.9  CL 112*   < > 113*  CO2 23   < > 27  BUN 19   < > 14  CREATININE 1.46*   < > 1.36*  CALCIUM 9.7   < > 9.9  PROT 7.5  --   --   BILITOT 0.3  --   --   ALKPHOS 52  --   --   ALT 14  --   --   AST 19  --   --   GLUCOSE 104*   < > 97   < > = values in this interval not displayed.     Radiology/Studies: Ct Angio Head W Or Wo  Contrast  Result Date: 01/01/2018 CLINICAL DATA:  Follow up code stroke. LEFT-sided weakness. History of stroke, hypertension and hyperlipidemia. EXAM: CT ANGIOGRAPHY HEAD AND NECK CT PERFUSION BRAIN TECHNIQUE: Multidetector CT imaging of the head and neck was performed using the standard protocol during bolus administration of intravenous contrast. Multiplanar CT image reconstructions and MIPs were obtained to evaluate the vascular anatomy. Carotid stenosis measurements (when applicable) are obtained utilizing NASCET criteria, using the distal internal carotid diameter as the denominator. Multiphase CT imaging of the brain was performed following IV bolus contrast injection. Subsequent parametric perfusion maps were calculated using RAPID software. CONTRAST:  182mL ISOVUE-370 IOPAMIDOL (ISOVUE-370) INJECTION 76% COMPARISON:  CT HEAD January 01, 2018 FINDINGS: CTA NECK FINDINGS: AORTIC ARCH: Normal appearance of the thoracic arch, normal branch pattern. Mild calcific atherosclerosis aortic arch. The origins of the innominate, left Common carotid artery and subclavian artery are widely patent. RIGHT CAROTID SYSTEM: Common carotid artery is patent. Calcific atherosclerosis resulting in less than 50% stenosis by NASCET criteria. Normal appearance of the internal carotid artery. LEFT CAROTID SYSTEM: Common carotid artery is patent. Mild calcific atherosclerosis of the carotid bifurcation without hemodynamically significant stenosis by NASCET criteria. Normal appearance  of the internal carotid artery. VERTEBRAL ARTERIES:Left vertebral artery is dominant. Calcific atherosclerosis resulting in mild stenosis RIGHT vertebral artery. Degenerative changes cervical spine resultant mild extrinsic compression. SKELETON: No acute osseous process though bone windows have not been submitted. Status post median sternotomy. 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 Patient is edentulous.  OTHER NECK: Soft tissues of the neck are nonacute though, not tailored for evaluation. UPPER CHEST: Included lung apices are clear. Mild centrilobular emphysema. No superior mediastinal lymphadenopathy. CTA HEAD FINDINGS: ANTERIOR CIRCULATION: Patent cervical internal carotid arteries, petrous, cavernous and supra clinoid internal carotid arteries. Patent anterior communicating artery. Patent anterior and middle cerebral arteries, moderate luminal irregularity compatible with atherosclerosis. No large vessel occlusion, significant stenosis, contrast extravasation or aneurysm. POSTERIOR CIRCULATION: Patent vertebral arteries, vertebrobasilar junction and basilar artery, as well as main branch vessels. Patent posterior cerebral arteries, moderate luminal irregularity compatible with atherosclerosis. No large vessel occlusion, significant stenosis, contrast extravasation or aneurysm. VENOUS SINUSES: Major dural venous sinuses are patent though not tailored for evaluation on this angiographic examination. ANATOMIC VARIANTS: None. DELAYED PHASE: Not performed. MIP images reviewed. CT Brain Perfusion Findings-motion degraded examination: CBF (<30%) Volume: 9mL Perfusion (Tmax>6.0s) volume: 86mL Mismatch Volume: 31mL Infarction Location:RIGHT frontal lobe. IMPRESSION: CTA NECK: 1. No hemodynamically significant stenosis ICA's. 2. Mild stenosis RIGHT vertebral artery origin. CTA HEAD: 1. No emergent large vessel occlusion. 2. Moderate cerebral artery atherosclerosis. CT PERFUSION: 1. Small acute RIGHT frontal/MCA penumbra though limited by motion artifact. Critical Value/emergent results text paged to Dr.ERIC [facility] via AMION secure system on 01/01/2018 at 3:55 am, including interpreting physician's phone number. Aortic Atherosclerosis (ICD10-I70.0). Emphysema (ICD10-J43.9). Electronically Signed   By: Elon Alas M.D.   On: 01/01/2018 03:56   Ct Angio Neck W Or Wo Contrast  Result Date: 01/01/2018 CLINICAL DATA:   Follow up code stroke. LEFT-sided weakness. History of stroke, hypertension and hyperlipidemia. EXAM: CT ANGIOGRAPHY HEAD AND NECK CT PERFUSION BRAIN TECHNIQUE: Multidetector CT imaging of the head and neck was performed using the standard protocol during bolus administration of intravenous contrast. Multiplanar CT image reconstructions and MIPs were obtained to evaluate the vascular anatomy. Carotid stenosis measurements (when applicable) are obtained utilizing NASCET criteria, using the distal internal carotid diameter as the denominator. Multiphase CT imaging of the brain was performed following IV bolus contrast injection. Subsequent parametric perfusion maps were calculated using RAPID software. CONTRAST:  125mL ISOVUE-370 IOPAMIDOL (ISOVUE-370) INJECTION 76% COMPARISON:  CT HEAD January 01, 2018 FINDINGS: CTA NECK FINDINGS: AORTIC ARCH: Normal appearance of the thoracic arch, normal branch pattern. Mild calcific atherosclerosis aortic arch. The origins of the innominate, left Common carotid artery and subclavian artery are widely patent. RIGHT CAROTID SYSTEM: Common carotid artery is patent. Calcific atherosclerosis resulting in less than 50% stenosis by NASCET criteria. Normal appearance of the internal carotid artery. LEFT CAROTID SYSTEM: Common carotid artery is patent. Mild calcific atherosclerosis of the carotid bifurcation without hemodynamically significant stenosis by NASCET criteria. Normal appearance of the internal carotid artery. VERTEBRAL ARTERIES:Left vertebral artery is dominant. Calcific atherosclerosis resulting in mild stenosis RIGHT vertebral artery. Degenerative changes cervical spine resultant mild extrinsic compression. SKELETON: No acute osseous process though bone windows have not been submitted. Status post median sternotomy. 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 Patient is edentulous. OTHER NECK: Soft tissues of the neck are nonacute  though, not tailored for evaluation. UPPER CHEST: Included lung apices are clear. Mild centrilobular emphysema. No superior mediastinal lymphadenopathy. CTA HEAD FINDINGS: ANTERIOR  CIRCULATION: Patent cervical internal carotid arteries, petrous, cavernous and supra clinoid internal carotid arteries. Patent anterior communicating artery. Patent anterior and middle cerebral arteries, moderate luminal irregularity compatible with atherosclerosis. No large vessel occlusion, significant stenosis, contrast extravasation or aneurysm. POSTERIOR CIRCULATION: Patent vertebral arteries, vertebrobasilar junction and basilar artery, as well as main branch vessels. Patent posterior cerebral arteries, moderate luminal irregularity compatible with atherosclerosis. No large vessel occlusion, significant stenosis, contrast extravasation or aneurysm. VENOUS SINUSES: Major dural venous sinuses are patent though not tailored for evaluation on this angiographic examination. ANATOMIC VARIANTS: None. DELAYED PHASE: Not performed. MIP images reviewed. CT Brain Perfusion Findings-motion degraded examination: CBF (<30%) Volume: 45mL Perfusion (Tmax>6.0s) volume: 110mL Mismatch Volume: 32mL Infarction Location:RIGHT frontal lobe. IMPRESSION: CTA NECK: 1. No hemodynamically significant stenosis ICA's. 2. Mild stenosis RIGHT vertebral artery origin. CTA HEAD: 1. No emergent large vessel occlusion. 2. Moderate cerebral artery atherosclerosis. CT PERFUSION: 1. Small acute RIGHT frontal/MCA penumbra though limited by motion artifact. Critical Value/emergent results text paged to Dr.ERIC [facility] via AMION secure system on 01/01/2018 at 3:55 am, including interpreting physician's phone number. Aortic Atherosclerosis (ICD10-I70.0). Emphysema (ICD10-J43.9). Electronically Signed   By: Elon Alas M.D.   On: 01/01/2018 03:56   Mr Brain Wo Contrast  Result Date: 01/01/2018 CLINICAL DATA:  Left-sided weakness. Focal neuro deficit of greater than 6  hours. EXAM: MRI HEAD WITHOUT CONTRAST TECHNIQUE: Multiplanar, multiecho pulse sequences of the brain and surrounding structures were obtained without intravenous contrast. COMPARISON:  CT head without contrast 01/01/2018. CTA head and neck 01/01/2018. FINDINGS: Brain: The diffusion-weighted images demonstrate a confluent nonhemorrhagic infarct in the right frontal operculum. Additional scattered areas of nonhemorrhagic infarct are present over the right frontal and parietal lobe as well as the right occipital lobe. The pattern follows a watershed distribution. T2 signal changes are associated with the areas of restricted diffusion. Additional confluent periventricular T2 signal changes are present bilaterally. Ventricles are of normal size. No significant extra-axial fluid collection present. A remote hemorrhagic lacunar infarct is present the right thalamus or posterior limb internal capsule. There is a remote hemorrhage in the anterior left frontal lobe. Vascular: Flow is present in the major intracranial arteries. Skull and upper cervical spine: The craniocervical junction is normal. Upper cervical spine is unremarkable. Marrow signal is somewhat depressed. This likely corresponds with anemia. Sinuses/Orbits: The paranasal sinuses and mastoid air cells are clear. Globes and orbits are within normal limits. IMPRESSION: 1. Acute/subacute right MCA territory infarcts involving the right frontal operculum an and diffusely along the right frontal and parietal lobes. The area along the convexity may be in a watershed distribution. 2. Additional confluent white matter changes are present bilaterally, likely reflecting the sequela of chronic microvascular ischemia. 3. Remote lacunar infarcts of the basal ganglia including a remote hemorrhagic infarct of the right thalamus. Electronically Signed   By: San Morelle M.D.   On: 01/01/2018 10:20   Ct C-spine No Charge  Result Date: 01/01/2018 CLINICAL DATA:   Mechanical fall. EXAM: CT CERVICAL SPINE WITHOUT CONTRAST TECHNIQUE: Reformatted CT imaging of the cervical spine was performed from CT angiogram head and neck. Multiplanar CT image reconstructions were also generated. COMPARISON:  None. FINDINGS: ALIGNMENT: Straightened lordosis.  Vertebral bodies in alignment. SKULL BASE AND VERTEBRAE: Cervical vertebral bodies and posterior elements are intact. Moderate C4-5, moderate to severe C6-7 disc height loss, mild at C5-6. Multilevel uncovertebral hypertrophy and endplate spurring. No destructive bony lesions. 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 Calcified craniocervical ligaments. C5-6 calcified ligamentum flavum. SOFT  TISSUES AND SPINAL CANAL: Nonacute. Nuchal ligament calcification. DISC LEVELS: Moderate canal stenosis C5-6 and C6-7. Moderate RIGHT C4-5, RIGHT C5-6 and bilateral C6-7 neural foraminal narrowing. UPPER CHEST: Lung apices are clear. OTHER: None. IMPRESSION: 1. No fracture or malalignment. 2. Moderate canal stenosis C5-6 and C6-7. 3. Moderate C4-5 through C6-7 neural foraminal narrowing. Electronically Signed   By: Elon Alas M.D.   On: 01/01/2018 04:18   Ct Cerebral Perfusion W Contrast  Result Date: 01/01/2018 CLINICAL DATA:  Follow up code stroke. LEFT-sided weakness. History of stroke, hypertension and hyperlipidemia. EXAM: CT ANGIOGRAPHY HEAD AND NECK CT PERFUSION BRAIN TECHNIQUE: Multidetector CT imaging of the head and neck was performed using the standard protocol during bolus administration of intravenous contrast. Multiplanar CT image reconstructions and MIPs were obtained to evaluate the vascular anatomy. Carotid stenosis measurements (when applicable) are obtained utilizing NASCET criteria, using the distal internal carotid diameter as the denominator. Multiphase CT imaging of the brain was performed following IV bolus contrast injection. Subsequent parametric perfusion maps were calculated using RAPID software.  CONTRAST:  161mL ISOVUE-370 IOPAMIDOL (ISOVUE-370) INJECTION 76% COMPARISON:  CT HEAD January 01, 2018 FINDINGS: CTA NECK FINDINGS: AORTIC ARCH: Normal appearance of the thoracic arch, normal branch pattern. Mild calcific atherosclerosis aortic arch. The origins of the innominate, left Common carotid artery and subclavian artery are widely patent. RIGHT CAROTID SYSTEM: Common carotid artery is patent. Calcific atherosclerosis resulting in less than 50% stenosis by NASCET criteria. Normal appearance of the internal carotid artery. LEFT CAROTID SYSTEM: Common carotid artery is patent. Mild calcific atherosclerosis of the carotid bifurcation without hemodynamically significant stenosis by NASCET criteria. Normal appearance of the internal carotid artery. VERTEBRAL ARTERIES:Left vertebral artery is dominant. Calcific atherosclerosis resulting in mild stenosis RIGHT vertebral artery. Degenerative changes cervical spine resultant mild extrinsic compression. SKELETON: No acute osseous process though bone windows have not been submitted. Status post median sternotomy. 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 Patient is edentulous. OTHER NECK: Soft tissues of the neck are nonacute though, not tailored for evaluation. UPPER CHEST: Included lung apices are clear. Mild centrilobular emphysema. No superior mediastinal lymphadenopathy. CTA HEAD FINDINGS: ANTERIOR CIRCULATION: Patent cervical internal carotid arteries, petrous, cavernous and supra clinoid internal carotid arteries. Patent anterior communicating artery. Patent anterior and middle cerebral arteries, moderate luminal irregularity compatible with atherosclerosis. No large vessel occlusion, significant stenosis, contrast extravasation or aneurysm. POSTERIOR CIRCULATION: Patent vertebral arteries, vertebrobasilar junction and basilar artery, as well as main branch vessels. Patent posterior cerebral arteries, moderate luminal  irregularity compatible with atherosclerosis. No large vessel occlusion, significant stenosis, contrast extravasation or aneurysm. VENOUS SINUSES: Major dural venous sinuses are patent though not tailored for evaluation on this angiographic examination. ANATOMIC VARIANTS: None. DELAYED PHASE: Not performed. MIP images reviewed. CT Brain Perfusion Findings-motion degraded examination: CBF (<30%) Volume: 54mL Perfusion (Tmax>6.0s) volume: 70mL Mismatch Volume: 17mL Infarction Location:RIGHT frontal lobe. IMPRESSION: CTA NECK: 1. No hemodynamically significant stenosis ICA's. 2. Mild stenosis RIGHT vertebral artery origin. CTA HEAD: 1. No emergent large vessel occlusion. 2. Moderate cerebral artery atherosclerosis. CT PERFUSION: 1. Small acute RIGHT frontal/MCA penumbra though limited by motion artifact. Critical Value/emergent results text paged to Dr.ERIC [facility] via AMION secure system on 01/01/2018 at 3:55 am, including interpreting physician's phone number. Aortic Atherosclerosis (ICD10-I70.0). Emphysema (ICD10-J43.9). Electronically Signed   By: Elon Alas M.D.   On: 01/01/2018 03:56   Dg Swallowing Func-speech Pathology  Result Date: 01/02/2018 Objective Swallowing Evaluation: Type of Study: MBS-Modified Barium Swallow Study  Patient  Details Name: KOLLEN ARMENTI MRN: [id number] Date of Birth: Aug 18, 1940 Today's Date: 01/02/2018 Time: SLP Start Time (ACUTE ONLY): 9735 -SLP Stop Time (ACUTE ONLY): 0935 SLP Time Calculation (min) (ACUTE ONLY): 31 min Past Medical History: Past Medical History: Diagnosis Date . CAD (coronary artery disease)   s/p cath in 2005 and CABG x4 . Cataract  . DJD (degenerative joint disease)  . DJD (degenerative joint disease)  . Fluid retention   mild . H/O: gout  . History of blood clots  . HTN (hypertension)  . Hyperlipidemia  . Hyperlipidemia  . Myocardial infarction ([place]) 2009 . Obesity   with reduction . OSA (obstructive sleep apnea)   AHI 98/hr . Renal insufficiency  . Sleep apnea   . Stroke [facility]) 2000 Past Surgical History: Past Surgical History: Procedure Laterality Date . CORONARY ARTERY BYPASS GRAFT  2005  LIMA to LAD, SVG to OM1 and OM 2, RCA.  [name] left inguinal hernia repair   . TEE WITHOUT CARDIOVERSION N/A 12/11/2016  Procedure: TRANSESOPHAGEAL ECHOCARDIOGRAM (TEE);  Surgeon: [name], MD;  Location: [facility] ENDOSCOPY;  Service: Cardiovascular;  Laterality: N/A; HPI: pt is a 77 yo adm to [facility] after being found on the floor.  pmh + for cva, cad s/p cabg, cervical spondylosis, ckd, htn, sleep apnea.  pt with left facial droop and dysarthria as well as left sided weakness.  speech and swallow eval ordered.  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   Subjective: pt awake in chair Assessment / Plan / Recommendation CHL IP CLINICAL IMPRESSIONS 01/02/2018 Clinical Impression Pt presents with moderate oropharyngeal dysphagia with sensorimotor deficits.  Pt with decreased oral coordination resulting in premature spillage of barium into phayrnx.  Delayed swallow and decreased laryngeal closure results in significant aspiration of thin via straw with cough response.  Pt did not clear aspirates with cough.  No aspiration or penetration with thin via tsp, nectar, puree, cracker but mild pharyngeal residuals mixed with secretions.  Pt with significantly compromised mastication of cracker *dentures at home which he uses for po* therefore recommend dys1/nectar and tsps thin water/[place] with strict precautions.  Of note, pt did become sleepy during MBS and required verbal/tactile dues to remain alert - therefore adequacy of nutrition may be worrisome.  STRICT precautions needed.    Medicine with puree *crushed if large*, start meals with liquids and intermittent dry swallow.  If pt is coughing, he IS ASPIRATING.  Using teach back with video, educated pt to recommendations and reinforced with written strategies/verbal teach and feedback. SLP Visit Diagnosis  Dysphagia, oropharyngeal phase (R13.12) Attention and concentration deficit following -- Frontal lobe and executive function deficit following -- Impact on safety and function Moderate aspiration risk   [facility] IP TREATMENT RECOMMENDATION 01/02/2018 Treatment Recommendations Therapy as outlined in treatment plan below   Prognosis 01/01/2018 Prognosis for Safe Diet Advancement Guarded Barriers to Reach Goals -- Barriers/Prognosis Comment -- [facility] IP DIET RECOMMENDATION 01/02/2018 SLP Diet Recommendations Dysphagia 1 (Puree) solids;Nectar thick liquid;Other (Comment) Liquid Administration via Cup;Straw Medication Administration Crushed with puree Compensations Slow rate;Small sips/bites Postural Changes Seated upright at 90 degrees;Remain semi-upright after after feeds/meals (Comment)   [facility] IP OTHER RECOMMENDATIONS 01/02/2018 Recommended Consults -- Oral Care Recommendations Oral care QID Other Recommendations Order thickener from pharmacy;Have oral suction available   [facility] IP FOLLOW UP RECOMMENDATIONS 01/02/2018 Follow up Recommendations Inpatient Rehab;Skilled Nursing facility   [facility] IP FREQUENCY AND DURATION 01/02/2018 Speech Therapy Frequency (ACUTE ONLY)  min 1 x/week Treatment Duration 1 week      [facility] IP ORAL PHASE 01/02/2018 Oral Phase Impaired Oral - Pudding Teaspoon -- Oral - Pudding Cup -- Oral - Honey Teaspoon -- Oral - Honey Cup -- Oral - Nectar Teaspoon Delayed oral transit;Premature spillage;Reduced posterior propulsion Oral - Nectar Cup Delayed oral transit;Premature spillage Oral - Nectar Straw Decreased bolus cohesion;Premature spillage;Reduced posterior propulsion Oral - Thin Teaspoon Decreased bolus cohesion;Premature spillage;Reduced posterior propulsion Oral - Thin Cup Decreased bolus cohesion;Premature spillage;Reduced posterior propulsion Oral - Thin Straw Decreased bolus cohesion;Premature spillage;Reduced posterior propulsion Oral - Puree Decreased bolus cohesion;Premature spillage;Reduced posterior  propulsion Oral - Mech Soft Premature spillage;Impaired mastication;Reduced posterior propulsion Oral - Regular -- Oral - Multi-Consistency -- Oral - Pill -- Oral Phase - Comment --  [facility] IP PHARYNGEAL PHASE 01/02/2018 Pharyngeal Phase Impaired Pharyngeal- Pudding Teaspoon -- Pharyngeal -- Pharyngeal- Pudding Cup -- Pharyngeal -- Pharyngeal- Honey Teaspoon -- Pharyngeal -- Pharyngeal- Honey Cup -- Pharyngeal -- Pharyngeal- Nectar Teaspoon Reduced tongue base retraction;Reduced airway/laryngeal closure;Reduced laryngeal elevation Pharyngeal -- Pharyngeal- Nectar Cup Reduced tongue base retraction;Reduced laryngeal elevation;Reduced airway/laryngeal closure Pharyngeal -- Pharyngeal- Nectar Straw Reduced tongue base retraction;Reduced laryngeal elevation;Reduced airway/laryngeal closure;Reduced pharyngeal peristalsis;Reduced epiglottic inversion Pharyngeal -- Pharyngeal- Thin Teaspoon Reduced tongue base retraction;Delayed swallow initiation-vallecula;Reduced airway/laryngeal closure;Reduced laryngeal elevation;Reduced pharyngeal peristalsis;Reduced epiglottic inversion Pharyngeal -- Pharyngeal- Thin Cup Reduced tongue base retraction;Pharyngeal residue - valleculae;Delayed swallow initiation-pyriform sinuses;Reduced airway/laryngeal closure;Reduced laryngeal elevation;Reduced pharyngeal peristalsis;Reduced epiglottic inversion Pharyngeal -- Pharyngeal- Thin Straw Reduced laryngeal elevation;Reduced airway/laryngeal closure;Reduced tongue base retraction;Penetration/Aspiration during swallow;Moderate aspiration;Delayed swallow initiation-pyriform sinuses;Reduced pharyngeal peristalsis;Reduced epiglottic inversion Pharyngeal Material enters airway, passes BELOW cords and not ejected out despite cough attempt by patient Pharyngeal- Puree Reduced tongue base retraction;Pharyngeal residue - valleculae;Reduced laryngeal elevation;Reduced airway/laryngeal closure;Reduced epiglottic inversion;Reduced pharyngeal peristalsis  Pharyngeal -- Pharyngeal- Mechanical Soft Reduced tongue base retraction;Pharyngeal residue - valleculae Pharyngeal -- Pharyngeal- Regular -- Pharyngeal -- Pharyngeal- Multi-consistency -- Pharyngeal -- Pharyngeal- Pill -- Pharyngeal -- Pharyngeal Comment --  No flowsheet data found. Macario Golds 01/02/2018, 2:40 PM  Luanna Salk, MS [facility] SLP Acute Rehab Services Pager [phone] Office [phone]             Ct Head Code Stroke Wo Contrast  Result Date: 01/01/2018 CLINICAL DATA:  Code stroke. LEFT-sided weakness. History of stroke, hypertension and hyperlipidemia. EXAM: CT HEAD WITHOUT CONTRAST TECHNIQUE: Contiguous axial images were obtained from the base of the skull through the vertex without intravenous contrast. COMPARISON:  MRI head March 17, 2013 FINDINGS: BRAIN: No intraparenchymal hemorrhage, mass effect nor midline shift. Old RIGHT cerebellar infarcts. Confluent pontine and supratentorial white matter hypodensities. Old RIGHT basal ganglia infarcts with ex vacuo dilatation RIGHT frontal horn of the lateral ventricle. No acute large vascular territory infarcts. No abnormal extra-axial fluid collections. Basal cisterns are patent. VASCULAR: Moderate calcific atherosclerosis of the carotid siphons. SKULL: No skull fracture. No significant scalp soft tissue swelling. SINUSES/ORBITS: Mild paranasal sinus mucosal thickening. Mastoid air cells are well aerated.LEFT buphthalmos and ocular lens implant. OTHER: Patient is edentulous. ASPECTS [facility] Stroke Program Early CT Score) - Ganglionic level infarction (caudate, lentiform nuclei, internal capsule, insula, M1-M3 cortex): 7 - Supraganglionic infarction (M4-M6 cortex): 3 Total score (0-10 with 10 being normal): 10 IMPRESSION: 1. No acute intracranial process. 2. ASPECTS is 10. 3. Moderate to severe chronic small vessel ischemic changes. 4. 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 5. Critical Value/emergent results text paged to  [place], Neurology via [employer] secure system on 01/01/2018 at 3:27 am,  including interpreting physician's phone number. Electronically Signed   By: Elon Alas M.D.   On: 01/01/2018 03:27    12-lead ECG sinus rhythm (personally reviewed) All prior EKG's in EPIC reviewed with no documented atrial fibrillation  Telemetry sinus rhythm, rare PVC (personally reviewed)  Assessment and Plan:  1. Cryptogenic stroke The patient presents with cryptogenic stroke.  The patient has a TEE planned for this AM.  I spoke at length with the patient about monitoring for afib with an implantable loop recorder.  Risks, benefits, and alteratives to implantable loop recorder were discussed with the patient today.   At this time, the patient is very clear in their decision to proceed with implantable loop recorder.   Wound care was reviewed with the patient (keep incision clean and dry for 3 days).  Wound check scheduled and entered in AVS.  Please call with questions.   Chanetta Marshall, NP 01/03/2018 10:31 AM  EP consultation note  Patient seen and examined.  Agree with the findings as noted above.  Pleasant 77 year old man who presents with a stroke and dense left hemiparesis.  The etiology of the stroke is unknown.  TEE was performed demonstrating no clear-cut etiology for her stroke.  He now presents for consideration for insertion of an implantable loop recorder.  I discussed the treatment options with the patient and the family and the risks, goals, benefits, and expectations of an insertion of an implantable loop recorder were reviewed and he wishes to proceed.  Mikle Bosworth.D.

## 2018-01-03 NOTE — CV Procedure (Signed)
     Transesophageal Echocardiogram Note  [name] [id number] 1940-03-26  Procedure: Transesophageal Echocardiogram Indications: stroke  Procedure Details Consent: Obtained Time Out: Verified patient identification, verified procedure, site/side was marked, verified correct patient position, special equipment/implants available, Radiology Safety Procedures followed,  medications/allergies/relevent history reviewed, required imaging and test results available.  Performed  Medications: During this procedure the patient is administered a total of Versed 3 mg and Fentanyl 25 mcg to achieve and maintain moderate conscious sedation.  The patient's heart rate, blood pressure, and oxygen saturation are monitored continuously during the procedure. The period of conscious sedation is 30 minutes, of which I was present face-to-face 100% of this time.  No intracardiac source of embolism was identified. Negative bubble study. No PFO.  Complications: No apparent complications Patient did tolerate procedure well.  [name], MD, [facility] 01/03/2018, 3:02 PM

## 2018-01-04 ENCOUNTER — Inpatient Hospital Stay (HOSPITAL_COMMUNITY): Payer: Medicare Other

## 2018-01-04 ENCOUNTER — Encounter (HOSPITAL_COMMUNITY): Payer: Self-pay | Admitting: Internal Medicine

## 2018-01-04 LAB — CBC
HCT: 39.8 % (ref 39.0–52.0)
Hemoglobin: 12.5 g/dL — ABNORMAL LOW (ref 13.0–17.0)
MCH: 26.9 pg (ref 26.0–34.0)
MCHC: 31.4 g/dL (ref 30.0–36.0)
MCV: 85.6 fL (ref 80.0–100.0)
Platelets: 243 10*3/uL (ref 150–400)
RBC: 4.65 MIL/uL (ref 4.22–5.81)
RDW: 16.3 % — ABNORMAL HIGH (ref 11.5–15.5)
WBC: 10.5 10*3/uL (ref 4.0–10.5)
nRBC: 0 % (ref 0.0–0.2)

## 2018-01-04 MED ORDER — AMLODIPINE BESYLATE 5 MG PO TABS
5.0000 mg | ORAL_TABLET | Freq: Every day | ORAL | Status: DC
  Administered 2018-01-04 – 2018-01-05 (×2): 5 mg via ORAL
  Filled 2018-01-04 (×2): qty 1

## 2018-01-04 MED ORDER — IBUPROFEN 200 MG PO TABS: 600.0000 mg | ORAL_TABLET | Freq: Once | ORAL | Status: DC

## 2018-01-04 MED ORDER — TAMSULOSIN HCL 0.4 MG PO CAPS: 0.4000 mg | ORAL_CAPSULE | Freq: Every day | ORAL | Status: DC

## 2018-01-04 MED ORDER — ROSUVASTATIN CALCIUM 20 MG PO TABS: 20.0000 mg | ORAL_TABLET | Freq: Every day | ORAL | Status: DC

## 2018-01-04 MED ORDER — DICLOFENAC SODIUM 1 % TD GEL
2.0000 g | Freq: Two times a day (BID) | TRANSDERMAL | Status: AC
  Administered 2018-01-04 – 2018-01-05 (×4): 2 g via TOPICAL
  Filled 2018-01-04: qty 100

## 2018-01-04 MED ORDER — ASPIRIN EC 81 MG PO TBEC: 81.0000 mg | DELAYED_RELEASE_TABLET | Freq: Every day | ORAL | Status: DC

## 2018-01-04 MED ORDER — CLOPIDOGREL BISULFATE 75 MG PO TABS: 75.0000 mg | ORAL_TABLET | Freq: Every day | ORAL | Status: DC

## 2018-01-04 MED ORDER — BISACODYL 10 MG RE SUPP
10.0000 mg | Freq: Every day | RECTAL | Status: DC
  Administered 2018-01-04 – 2018-01-05 (×2): 10 mg via RECTAL
  Filled 2018-01-04 (×2): qty 1

## 2018-01-04 MED ORDER — POLYETHYLENE GLYCOL 3350 17 G PO PACK
17.0000 g | PACK | Freq: Two times a day (BID) | ORAL | Status: DC
  Administered 2018-01-04 – 2018-01-05 (×2): 17 g via ORAL
  Filled 2018-01-04 (×2): qty 1

## 2018-01-04 NOTE — Discharge Summary (Signed)
[name] LSL:[id number] DOB: 1940/06/30 DOA: 01/01/2018  PCP: Rogers Blocker, MD  Admit date: 01/01/2018  Discharge date: 01/04/2018  Admitted From: Home   Disposition:  CIR/SNF   Recommendations for Outpatient Follow-up:   Follow up with PCP in 1-2 weeks  PCP Please obtain BMP/CBC, 2 view CXR in 1week,  (see Discharge instructions)   PCP Please follow up on the following pending results:    Home Health: None   Equipment/Devices: None  Consultations: Neuro Discharge Condition: Stable   CODE STATUS: Full   Diet Recommendation: Dysphagia 1 diet with nectar thick liquids, full feeding assistance and aspiration precautions.   Chief Complaint  Patient presents with  . Code Stroke     Brief history of present illness from the day of admission and additional interim summary    Shane T Foustis a 77 y.o.malewithhistory of CAD status post CABG, hypertension, chronic kidney disease, hyperlipidemia, sleep apnea was found on the floor by patient's wife around 2 AM this morning in the bathroom. Patient's wife woke up after she heard a sound. On exam patient was having left facial drooping dysarthria and left-sided weakness.  He was diagnosed with CVA and admitted to the hospital.                                                                 Hospital Course    1.  Left-sided weakness, dysphagia, dysarthria due to acute right MCA territory infarcts noted on MRI.  CTA head and neck unremarkable, table TTE and TEE, loop recorder placed, still has mild left-sided weakness mostly in the left upper extremity along with mild dysphagia, seen by neurology team and placed on dual antiplatelet therapy which is aspirin and Plavix for 3 weeks, after 01/21/2018 continue Plavix and stop aspirin, statin dose has been increased for  better LDL goal.  He will require CIR/SNF for placement for continued PT and speech treatment, follow with PCP and neurology post discharge for follow-up in the next 2 to 4 weeks.  2.  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  Creatinine close to his baseline of 1.4.  ARF resolved after gentle hydration.  3.  Essential hypertension.  Allow for permissive hypertension for stroke.    Home dose beta-blocker has been started and I have added low-dose Norvasc on 01/04/2018 for gently curving the blood pressure 3 days after CVA continue to monitor and adjust gradually over the next 1 week.  4.  CAD.  No acute issue.  Continue aspirin, Plavix and statin for now, resume beta-blocker and now low-dose Norvasc added, monitor and adjust.  5.  Dyslipidemia.    Have double home dose statin and continue home dose niacin for dyslipidemia, LDL was near goal at 73.  6.  GERD.  On PPI.  7. HX Gout. Resume  allopurinol once taking orally.  Discharge diagnosis     Principal Problem:   Acute CVA (cerebrovascular accident) [facility]) Active Problems:   CAD (coronary artery disease)   Hypertensive urgency   CKD (chronic kidney disease) stage 2, GFR 60-89 ml/min   Left-sided weakness   Osteoarthritis   OSA (obstructive sleep apnea)   Noncompliance with CPAP treatment   History of DVT (deep vein thrombosis)   History of CVA (cerebrovascular accident)   Dysphagia, post-stroke   Sinus tachycardia   Acute blood loss anemia    Discharge instructions    Discharge Instructions    Ambulatory referral to Neurology   Complete by:  As directed    Follow up with stroke clinic NP (Jessica Vanschaick or Cecille Rubin, if both not available, consider Zachery Dauer, or A[place]) at [facility] in about 4 weeks. Thanks.   Diet - low sodium heart healthy   Complete by:  As directed    Discharge instructions   Complete by:  As directed    Follow with Primary MD Rogers Blocker, MD in 7 days   Get CBC, CMP, 2 view Chest X ray -  checked  by Primary MD  or SNF MD in 5-7 days   Activity: As tolerated with Full fall precautions use walker/cane & assistance as needed  Disposition SNF/CIR  Diet: Dysphagia 1 diet-nectar thick liquids, with full feeding assistance and aspiration precautions.  Special Instructions: If you have smoked or chewed Tobacco  in the last 2 yrs please stop smoking, stop any regular Alcohol  and or any Recreational drug use.  On your next visit with your primary care physician please Get Medicines reviewed and adjusted.  Please request your Prim.MD to go over all Hospital Tests and Procedure/Radiological results at the follow up, please get all Hospital records sent to your Prim MD by signing hospital release before you go home.  If you experience worsening of your admission symptoms, develop shortness of breath, life threatening emergency, suicidal or homicidal thoughts you must seek medical attention immediately by calling 911 or calling your MD immediately  if symptoms less severe.   Increase activity slowly   Complete by:  As directed       Discharge Medications   Allergies as of 01/04/2018      Reactions   Ramipril Cough      Medication List    STOP taking these medications   lovastatin 20 MG tablet Commonly known as:  MEVACOR     TAKE these medications   allopurinol 100 MG tablet Commonly known as:  ZYLOPRIM Take 200 mg by mouth daily.   amLODipine 5 MG tablet Commonly known as:  NORVASC TAKE ONE TABLET BY MOUTH ONCE DAILY.   aspirin EC 81 MG tablet Take 1 tablet (81 mg total) by mouth daily for 17 days.   clopidogrel 75 MG tablet Commonly known as:  PLAVIX Take 1 tablet (75 mg total) by mouth daily. Start taking on:  01/05/2018   furosemide 20 MG tablet Commonly known as:  LASIX Take 20 mg by mouth 2 (two) times daily.   metoprolol tartrate 25 MG tablet Commonly known as:  LOPRESSOR TAKE ONE TABLET BY MOUTH TWICE DAILY   montelukast 10 MG tablet Commonly known as:  SINGULAIR Take 10  mg by mouth at bedtime.   niacin 500 MG CR tablet Commonly known as:  NIASPAN Take 500 mg by mouth at bedtime.   omeprazole 40 MG capsule Commonly known as:  PRILOSEC Take 40  mg by mouth daily.   phosphorus 155-852-130 MG tablet Commonly known as:  K PHOS NEUTRAL Take 2 tablets (500 mg total) by mouth 3 (three) times daily.   potassium chloride 10 MEQ tablet Commonly known as:  K-DUR Take 10 mEq by mouth 2 (two) times daily.   rosuvastatin 20 MG tablet Commonly known as:  CRESTOR Take 1 tablet (20 mg total) by mouth daily at 6 PM. What changed:    medication strength  how much to take  when to take this   tamsulosin 0.4 MG Caps capsule Commonly known as:  FLOMAX Take 1 capsule (0.4 mg total) by mouth daily. Start taking on:  01/05/2018       Follow-up Information    [place] [facility] Follow up on 01/17/2018.   Specialty:  Cardiology Why:  at [facility] information: [address] [place] [phone]       [name]. Schedule an appointment as soon as possible for a visit in 4 week(s).   Specialty:  Neurology Contact information: [address] [place] [phone]       Waynetta Sandy, MD. Schedule an appointment as soon as possible for a visit in 1 week(s).   Specialties:  Vascular Surgery, Cardiology Why:  carotid plaque Contact information: [address] [place] [address] [phone]           Major procedures and Radiology Reports - PLEASE review detailed and final reports thoroughly  -     Leg [place] -  Preliminary report:There is no obvious evidence of DVT or SVT noted in the bilateral lower extremities.  MRI - 1. Acute/subacute right MCA territory infarcts involving the right frontal operculum an and diffusely along the right frontal and parietal lobes. The area along the convexity may be in a watershed  distribution. 2. Additional confluent white matter changes are present bilaterally, likely reflecting the sequela of chronic microvascular ischemia. 3. Remote lacunar infarcts of the basal ganglia including a remote hemorrhagic infarct of the right thalamus  Loop recorder placed.    TEE - no embolic source.  TTE Left ventricle: The cavity size was normal. There was moderateconcentric hypertrophy. Systolic function was normal. Theestimated ejection fraction was in the range of 60% to 65%. Wallmotion was normal; there were no regional wall motionabnormalities. Doppler parameters are consistent with abnormalleft ventricular relaxation (grade 1 diastolic dysfunction).Acoustic contrast opacification revealed no evidence of thrombus. - Aortic valve: Valve area (VTI): 2.38 cm^2. Valve area (Vmax):2.41 cm^2. Valve area (Vmean): 2.4 cm^2. - Left atrium: The atrium was moderately dilated.  CTA NECK: 1. No hemodynamically significant stenosis ICA's. 2. Mild stenosis RIGHT vertebral artery origin. CTA HEAD: 1. No emergent large vessel occlusion. 2. Moderate cerebral artery atherosclerosis. CT PERFUSION: 1. Small acute RIGHT frontal/MCA penumbra though limited by motion artifact   Ct Angio Head W Or Wo Contrast  Result Date: 01/01/2018 CLINICAL DATA:  Follow up code stroke. LEFT-sided weakness. History of stroke, hypertension and hyperlipidemia. EXAM: CT ANGIOGRAPHY HEAD AND NECK CT PERFUSION BRAIN TECHNIQUE: Multidetector CT imaging of the head and neck was performed using the standard protocol during bolus administration of intravenous contrast. Multiplanar CT image reconstructions and MIPs were obtained to evaluate the vascular anatomy. Carotid stenosis measurements (when applicable) are obtained utilizing NASCET criteria, using the distal internal carotid diameter as the denominator. Multiphase CT imaging of the brain was performed following IV bolus contrast injection. Subsequent parametric  perfusion maps were calculated using RAPID software. CONTRAST:  176mL ISOVUE-370 IOPAMIDOL (ISOVUE-370) INJECTION 76% COMPARISON:  CT HEAD January 01, 2018 FINDINGS: CTA NECK FINDINGS: AORTIC ARCH: Normal appearance of the thoracic arch, normal branch pattern. Mild calcific atherosclerosis aortic arch. The origins of the innominate, left Common carotid artery and subclavian artery are widely patent. RIGHT CAROTID SYSTEM: Common carotid artery is patent. Calcific atherosclerosis resulting in less than 50% stenosis by NASCET criteria. Normal appearance of the internal carotid artery. LEFT CAROTID SYSTEM: Common carotid artery is patent. Mild calcific atherosclerosis of the carotid bifurcation without hemodynamically significant stenosis by NASCET criteria. Normal appearance of the internal carotid artery. VERTEBRAL ARTERIES:Left vertebral artery is dominant. Calcific atherosclerosis resulting in mild stenosis RIGHT vertebral artery. Degenerative changes cervical spine resultant mild extrinsic compression. SKELETON: No acute osseous process though bone windows have not been submitted. Status post median sternotomy. 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 Patient is edentulous. OTHER NECK: Soft tissues of the neck are nonacute though, not tailored for evaluation. UPPER CHEST: Included lung apices are clear. Mild centrilobular emphysema. No superior mediastinal lymphadenopathy. CTA HEAD FINDINGS: ANTERIOR CIRCULATION: Patent cervical internal carotid arteries, petrous, cavernous and supra clinoid internal carotid arteries. Patent anterior communicating artery. Patent anterior and middle cerebral arteries, moderate luminal irregularity compatible with atherosclerosis. No large vessel occlusion, significant stenosis, contrast extravasation or aneurysm. POSTERIOR CIRCULATION: Patent vertebral arteries, vertebrobasilar junction and basilar artery, as well as main branch vessels.  Patent posterior cerebral arteries, moderate luminal irregularity compatible with atherosclerosis. No large vessel occlusion, significant stenosis, contrast extravasation or aneurysm. VENOUS SINUSES: Major dural venous sinuses are patent though not tailored for evaluation on this angiographic examination. ANATOMIC VARIANTS: None. DELAYED PHASE: Not performed. MIP images reviewed. CT Brain Perfusion Findings-motion degraded examination: CBF (<30%) Volume: 40mL Perfusion (Tmax>6.0s) volume: 44mL Mismatch Volume: 33mL Infarction Location:RIGHT frontal lobe. IMPRESSION: CTA NECK: 1. No hemodynamically significant stenosis ICA's. 2. Mild stenosis RIGHT vertebral artery origin. CTA HEAD: 1. No emergent large vessel occlusion. 2. Moderate cerebral artery atherosclerosis. CT PERFUSION: 1. Small acute RIGHT frontal/MCA penumbra though limited by motion artifact. Critical Value/emergent results text paged to Dr.ERIC [facility] via AMION secure system on 01/01/2018 at 3:55 am, including interpreting physician's phone number. Aortic Atherosclerosis (ICD10-I70.0). Emphysema (ICD10-J43.9). Electronically Signed   By: Elon Alas M.D.   On: 01/01/2018 03:56   Ct Angio Neck W Or Wo Contrast  Result Date: 01/01/2018 CLINICAL DATA:  Follow up code stroke. LEFT-sided weakness. History of stroke, hypertension and hyperlipidemia. EXAM: CT ANGIOGRAPHY HEAD AND NECK CT PERFUSION BRAIN TECHNIQUE: Multidetector CT imaging of the head and neck was performed using the standard protocol during bolus administration of intravenous contrast. Multiplanar CT image reconstructions and MIPs were obtained to evaluate the vascular anatomy. Carotid stenosis measurements (when applicable) are obtained utilizing NASCET criteria, using the distal internal carotid diameter as the denominator. Multiphase CT imaging of the brain was performed following IV bolus contrast injection. Subsequent parametric perfusion maps were calculated using RAPID software.  CONTRAST:  139mL ISOVUE-370 IOPAMIDOL (ISOVUE-370) INJECTION 76% COMPARISON:  CT HEAD January 01, 2018 FINDINGS: CTA NECK FINDINGS: AORTIC ARCH: Normal appearance of the thoracic arch, normal branch pattern. Mild calcific atherosclerosis aortic arch. The origins of the innominate, left Common carotid artery and subclavian artery are widely patent. RIGHT CAROTID SYSTEM: Common carotid artery is patent. Calcific atherosclerosis resulting in less than 50% stenosis by NASCET criteria. Normal appearance of the internal carotid artery. LEFT CAROTID SYSTEM: Common carotid artery is  patent. Mild calcific atherosclerosis of the carotid bifurcation without hemodynamically significant stenosis by NASCET criteria. Normal appearance of the internal carotid artery. VERTEBRAL ARTERIES:Left vertebral artery is dominant. Calcific atherosclerosis resulting in mild stenosis RIGHT vertebral artery. Degenerative changes cervical spine resultant mild extrinsic compression. SKELETON: No acute osseous process though bone windows have not been submitted. Status post median sternotomy. 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 Patient is edentulous. OTHER NECK: Soft tissues of the neck are nonacute though, not tailored for evaluation. UPPER CHEST: Included lung apices are clear. Mild centrilobular emphysema. No superior mediastinal lymphadenopathy. CTA HEAD FINDINGS: ANTERIOR CIRCULATION: Patent cervical internal carotid arteries, petrous, cavernous and supra clinoid internal carotid arteries. Patent anterior communicating artery. Patent anterior and middle cerebral arteries, moderate luminal irregularity compatible with atherosclerosis. No large vessel occlusion, significant stenosis, contrast extravasation or aneurysm. POSTERIOR CIRCULATION: Patent vertebral arteries, vertebrobasilar junction and basilar artery, as well as main branch vessels. Patent posterior cerebral arteries, moderate luminal  irregularity compatible with atherosclerosis. No large vessel occlusion, significant stenosis, contrast extravasation or aneurysm. VENOUS SINUSES: Major dural venous sinuses are patent though not tailored for evaluation on this angiographic examination. ANATOMIC VARIANTS: None. DELAYED PHASE: Not performed. MIP images reviewed. CT Brain Perfusion Findings-motion degraded examination: CBF (<30%) Volume: 19mL Perfusion (Tmax>6.0s) volume: 61mL Mismatch Volume: 14mL Infarction Location:RIGHT frontal lobe. IMPRESSION: CTA NECK: 1. No hemodynamically significant stenosis ICA's. 2. Mild stenosis RIGHT vertebral artery origin. CTA HEAD: 1. No emergent large vessel occlusion. 2. Moderate cerebral artery atherosclerosis. CT PERFUSION: 1. Small acute RIGHT frontal/MCA penumbra though limited by motion artifact. Critical Value/emergent results text paged to Dr[facility]ERIC [facility] via AMION secure system on 01/01/2018 at 3:55 am, including interpreting physician's phone number. Aortic Atherosclerosis (ICD10-I70.0). Emphysema (ICD10-J43.9). Electronically Signed   By: Elon Alas M.D.   On: 01/01/2018 03:56   Mr Brain Wo Contrast  Result Date: 01/01/2018 CLINICAL DATA:  Left-sided weakness. Focal neuro deficit of greater than 6 hours. EXAM: MRI HEAD WITHOUT CONTRAST TECHNIQUE: Multiplanar, multiecho pulse sequences of the brain and surrounding structures were obtained without intravenous contrast. COMPARISON:  CT head without contrast 01/01/2018. CTA head and neck 01/01/2018. FINDINGS: Brain: The diffusion-weighted images demonstrate a confluent nonhemorrhagic infarct in the right frontal operculum. Additional scattered areas of nonhemorrhagic infarct are present over the right frontal and parietal lobe as well as the right occipital lobe. The pattern follows a watershed distribution. T2 signal changes are associated with the areas of restricted diffusion. Additional confluent periventricular T2 signal changes are present  bilaterally. Ventricles are of normal size. No significant extra-axial fluid collection present. A remote hemorrhagic lacunar infarct is present the right thalamus or posterior limb internal capsule. There is a remote hemorrhage in the anterior left frontal lobe. Vascular: Flow is present in the major intracranial arteries. Skull and upper cervical spine: The craniocervical junction is normal. Upper cervical spine is unremarkable. Marrow signal is somewhat depressed. This likely corresponds with anemia. Sinuses/Orbits: The paranasal sinuses and mastoid air cells are clear. Globes and orbits are within normal limits. IMPRESSION: 1. Acute/subacute right MCA territory infarcts involving the right frontal operculum an and diffusely along the right frontal and parietal lobes. The area along the convexity may be in a watershed distribution. 2. Additional confluent white matter changes are present bilaterally, likely reflecting the sequela of chronic microvascular ischemia. 3. Remote lacunar infarcts of the basal ganglia including a remote hemorrhagic infarct of the right thalamus. Electronically Signed   By: Wynetta Fines.D.  On: 01/01/2018 10:20   Ct C-spine No Charge  Result Date: 01/01/2018 CLINICAL DATA:  Mechanical fall. EXAM: CT CERVICAL SPINE WITHOUT CONTRAST TECHNIQUE: Reformatted CT imaging of the cervical spine was performed from CT angiogram head and neck. Multiplanar CT image reconstructions were also generated. COMPARISON:  None. FINDINGS: ALIGNMENT: Straightened lordosis.  Vertebral bodies in alignment. SKULL BASE AND VERTEBRAE: Cervical vertebral bodies and posterior elements are intact. Moderate C4-5, moderate to severe C6-7 disc height loss, mild at C5-6. Multilevel uncovertebral hypertrophy and endplate spurring. No destructive bony lesions. 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 Calcified craniocervical ligaments. C5-6 calcified ligamentum flavum. SOFT TISSUES AND SPINAL  CANAL: Nonacute. Nuchal ligament calcification. DISC LEVELS: Moderate canal stenosis C5-6 and C6-7. Moderate RIGHT C4-5, RIGHT C5-6 and bilateral C6-7 neural foraminal narrowing. UPPER CHEST: Lung apices are clear. OTHER: None. IMPRESSION: 1. No fracture or malalignment. 2. Moderate canal stenosis C5-6 and C6-7. 3. Moderate C4-5 through C6-7 neural foraminal narrowing. Electronically Signed   By: Elon Alas M.D.   On: 01/01/2018 04:18   Ct Cerebral Perfusion W Contrast  Result Date: 01/01/2018 CLINICAL DATA:  Follow up code stroke. LEFT-sided weakness. History of stroke, hypertension and hyperlipidemia. EXAM: CT ANGIOGRAPHY HEAD AND NECK CT PERFUSION BRAIN TECHNIQUE: Multidetector CT imaging of the head and neck was performed using the standard protocol during bolus administration of intravenous contrast. Multiplanar CT image reconstructions and MIPs were obtained to evaluate the vascular anatomy. Carotid stenosis measurements (when applicable) are obtained utilizing NASCET criteria, using the distal internal carotid diameter as the denominator. Multiphase CT imaging of the brain was performed following IV bolus contrast injection. Subsequent parametric perfusion maps were calculated using RAPID software. CONTRAST:  131mL ISOVUE-370 IOPAMIDOL (ISOVUE-370) INJECTION 76% COMPARISON:  CT HEAD January 01, 2018 FINDINGS: CTA NECK FINDINGS: AORTIC ARCH: Normal appearance of the thoracic arch, normal branch pattern. Mild calcific atherosclerosis aortic arch. The origins of the innominate, left Common carotid artery and subclavian artery are widely patent. RIGHT CAROTID SYSTEM: Common carotid artery is patent. Calcific atherosclerosis resulting in less than 50% stenosis by NASCET criteria. Normal appearance of the internal carotid artery. LEFT CAROTID SYSTEM: Common carotid artery is patent. Mild calcific atherosclerosis of the carotid bifurcation without hemodynamically significant stenosis by NASCET criteria.  Normal appearance of the internal carotid artery. VERTEBRAL ARTERIES:Left vertebral artery is dominant. Calcific atherosclerosis resulting in mild stenosis RIGHT vertebral artery. Degenerative changes cervical spine resultant mild extrinsic compression. SKELETON: No acute osseous process though bone windows have not been submitted. Status post median sternotomy. 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 Patient is edentulous. OTHER NECK: Soft tissues of the neck are nonacute though, not tailored for evaluation. UPPER CHEST: Included lung apices are clear. Mild centrilobular emphysema. No superior mediastinal lymphadenopathy. CTA HEAD FINDINGS: ANTERIOR CIRCULATION: Patent cervical internal carotid arteries, petrous, cavernous and supra clinoid internal carotid arteries. Patent anterior communicating artery. Patent anterior and middle cerebral arteries, moderate luminal irregularity compatible with atherosclerosis. No large vessel occlusion, significant stenosis, contrast extravasation or aneurysm. POSTERIOR CIRCULATION: Patent vertebral arteries, vertebrobasilar junction and basilar artery, as well as main branch vessels. Patent posterior cerebral arteries, moderate luminal irregularity compatible with atherosclerosis. No large vessel occlusion, significant stenosis, contrast extravasation or aneurysm. VENOUS SINUSES: Major dural venous sinuses are patent though not tailored for evaluation on this angiographic examination. ANATOMIC VARIANTS: None. DELAYED PHASE: Not performed. MIP images reviewed. CT Brain Perfusion Findings-motion degraded examination: CBF (<30%) Volume: 85mL Perfusion (Tmax>6.0s) volume: 78mL Mismatch Volume: 53mL Infarction Location:RIGHT frontal  lobe. IMPRESSION: CTA NECK: 1. No hemodynamically significant stenosis ICA's. 2. Mild stenosis RIGHT vertebral artery origin. CTA HEAD: 1. No emergent large vessel occlusion. 2. Moderate cerebral artery  atherosclerosis. CT PERFUSION: 1. Small acute RIGHT frontal/MCA penumbra though limited by motion artifact. Critical Value/emergent results text paged to Dr.ERIC [facility] via AMION secure system on 01/01/2018 at 3:55 am, including interpreting physician's phone number. Aortic Atherosclerosis (ICD10-I70.0). Emphysema (ICD10-J43.9). Electronically Signed   By: Elon Alas M.D.   On: 01/01/2018 03:56   Dg Swallowing Func-speech Pathology  Result Date: 01/02/2018 Objective Swallowing Evaluation: Type of Study: MBS-Modified Barium Swallow Study  Patient Details Name: CYPRUS KUANG MRN: [id number] Date of Birth: 13-Dec-1940 Today's Date: 01/02/2018 Time: SLP Start Time (ACUTE ONLY): 9622 -SLP Stop Time (ACUTE ONLY): 0935 SLP Time Calculation (min) (ACUTE ONLY): 31 min Past Medical History: Past Medical History: Diagnosis Date . CAD (coronary artery disease)   s/p cath in 2005 and CABG x4 . Cataract  . DJD (degenerative joint disease)  . DJD (degenerative joint disease)  . Fluid retention   mild . H/O: gout  . History of blood clots  . HTN (hypertension)  . Hyperlipidemia  . Hyperlipidemia  . Myocardial infarction ([place]) 2009 . Obesity   with reduction . OSA (obstructive sleep apnea)   AHI 98/hr . Renal insufficiency  . Sleep apnea  . Stroke [facility]) 2000 Past Surgical History: Past Surgical History: Procedure Laterality Date . CORONARY ARTERY BYPASS GRAFT  2005  LIMA to LAD, SVG to OM1 and OM 2, RCA.  [name] left inguinal hernia repair   . TEE WITHOUT CARDIOVERSION N/A 12/11/2016  Procedure: TRANSESOPHAGEAL ECHOCARDIOGRAM (TEE);  Surgeon: [name], MD;  Location: [facility] ENDOSCOPY;  Service: Cardiovascular;  Laterality: N/A; HPI: pt is a 77 yo adm to [facility] after being found on the floor.  pmh + for cva, cad s/p cabg, cervical spondylosis, ckd, htn, sleep apnea.  pt with left facial droop and dysarthria as well as left sided weakness.  speech and swallow eval ordered.  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   Subjective: pt awake in chair Assessment / Plan / Recommendation CHL IP CLINICAL IMPRESSIONS 01/02/2018 Clinical Impression Pt presents with moderate oropharyngeal dysphagia with sensorimotor deficits.  Pt with decreased oral coordination resulting in premature spillage of barium into phayrnx.  Delayed swallow and decreased laryngeal closure results in significant aspiration of thin via straw with cough response.  Pt did not clear aspirates with cough.  No aspiration or penetration with thin via tsp, nectar, puree, cracker but mild pharyngeal residuals mixed with secretions.  Pt with significantly compromised mastication of cracker *dentures at home which he uses for po* therefore recommend dys1/nectar and tsps thin water/[place] with strict precautions.  Of note, pt did become sleepy during MBS and required verbal/tactile dues to remain alert - therefore adequacy of nutrition may be worrisome.  STRICT precautions needed.    Medicine with puree *crushed if large*, start meals with liquids and intermittent dry swallow.  If pt is coughing, he IS ASPIRATING.  Using teach back with video, educated pt to recommendations and reinforced with written strategies/verbal teach and feedback. SLP Visit Diagnosis Dysphagia, oropharyngeal phase (R13.12) Attention and concentration deficit following -- Frontal lobe and executive function deficit following -- Impact on safety and function Moderate aspiration risk   CHL IP TREATMENT RECOMMENDATION 01/02/2018 Treatment Recommendations Therapy as outlined in treatment plan below   Prognosis 01/01/2018 Prognosis  for Safe Diet Advancement Guarded Barriers to Reach Goals -- Barriers/Prognosis Comment -- CHL IP DIET RECOMMENDATION 01/02/2018 SLP Diet Recommendations Dysphagia 1 (Puree) solids;Nectar thick liquid;Other (Comment) Liquid Administration via Cup;Straw Medication Administration Crushed with puree Compensations Slow rate;Small sips/bites Postural  Changes Seated upright at 90 degrees;Remain semi-upright after after feeds/meals (Comment)   CHL IP OTHER RECOMMENDATIONS 01/02/2018 Recommended Consults -- Oral Care Recommendations Oral care QID Other Recommendations Order thickener from pharmacy;Have oral suction available   CHL IP FOLLOW UP RECOMMENDATIONS 01/02/2018 Follow up Recommendations Inpatient Rehab;Skilled Nursing facility   [facility] IP FREQUENCY AND DURATION 01/02/2018 Speech Therapy Frequency (ACUTE ONLY) min 1 x/week Treatment Duration 1 week      CHL IP ORAL PHASE 01/02/2018 Oral Phase Impaired Oral - Pudding Teaspoon -- Oral - Pudding Cup -- Oral - Honey Teaspoon -- Oral - Honey Cup -- Oral - Nectar Teaspoon Delayed oral transit;Premature spillage;Reduced posterior propulsion Oral - Nectar Cup Delayed oral transit;Premature spillage Oral - Nectar Straw Decreased bolus cohesion;Premature spillage;Reduced posterior propulsion Oral - Thin Teaspoon Decreased bolus cohesion;Premature spillage;Reduced posterior propulsion Oral - Thin Cup Decreased bolus cohesion;Premature spillage;Reduced posterior propulsion Oral - Thin Straw Decreased bolus cohesion;Premature spillage;Reduced posterior propulsion Oral - Puree Decreased bolus cohesion;Premature spillage;Reduced posterior propulsion Oral - Mech Soft Premature spillage;Impaired mastication;Reduced posterior propulsion Oral - Regular -- Oral - Multi-Consistency -- Oral - Pill -- Oral Phase - Comment --  CHL IP PHARYNGEAL PHASE 01/02/2018 Pharyngeal Phase Impaired Pharyngeal- Pudding Teaspoon -- Pharyngeal -- Pharyngeal- Pudding Cup -- Pharyngeal -- Pharyngeal- Honey Teaspoon -- Pharyngeal -- Pharyngeal- Honey Cup -- Pharyngeal -- Pharyngeal- Nectar Teaspoon Reduced tongue base retraction;Reduced airway/laryngeal closure;Reduced laryngeal elevation Pharyngeal -- Pharyngeal- Nectar Cup Reduced tongue base retraction;Reduced laryngeal elevation;Reduced airway/laryngeal closure Pharyngeal -- Pharyngeal- Nectar Straw  Reduced tongue base retraction;Reduced laryngeal elevation;Reduced airway/laryngeal closure;Reduced pharyngeal peristalsis;Reduced epiglottic inversion Pharyngeal -- Pharyngeal- Thin Teaspoon Reduced tongue base retraction;Delayed swallow initiation-vallecula;Reduced airway/laryngeal closure;Reduced laryngeal elevation;Reduced pharyngeal peristalsis;Reduced epiglottic inversion Pharyngeal -- Pharyngeal- Thin Cup Reduced tongue base retraction;Pharyngeal residue - valleculae;Delayed swallow initiation-pyriform sinuses;Reduced airway/laryngeal closure;Reduced laryngeal elevation;Reduced pharyngeal peristalsis;Reduced epiglottic inversion Pharyngeal -- Pharyngeal- Thin Straw Reduced laryngeal elevation;Reduced airway/laryngeal closure;Reduced tongue base retraction;Penetration/Aspiration during swallow;Moderate aspiration;Delayed swallow initiation-pyriform sinuses;Reduced pharyngeal peristalsis;Reduced epiglottic inversion Pharyngeal Material enters airway, passes BELOW cords and not ejected out despite cough attempt by patient Pharyngeal- Puree Reduced tongue base retraction;Pharyngeal residue - valleculae;Reduced laryngeal elevation;Reduced airway/laryngeal closure;Reduced epiglottic inversion;Reduced pharyngeal peristalsis Pharyngeal -- Pharyngeal- Mechanical Soft Reduced tongue base retraction;Pharyngeal residue - valleculae Pharyngeal -- Pharyngeal- Regular -- Pharyngeal -- Pharyngeal- Multi-consistency -- Pharyngeal -- Pharyngeal- Pill -- Pharyngeal -- Pharyngeal Comment --  No flowsheet data found. Macario Golds 01/02/2018, 2:40 PM  Luanna Salk, MS [facility] SLP Acute Rehab Services Pager [phone] Office [phone]             Ct Head Code Stroke Wo Contrast  Result Date: 01/01/2018 CLINICAL DATA:  Code stroke. LEFT-sided weakness. History of stroke, hypertension and hyperlipidemia. EXAM: CT HEAD WITHOUT CONTRAST TECHNIQUE: Contiguous axial images were obtained from the base of the skull through the  vertex without intravenous contrast. COMPARISON:  MRI head March 17, 2013 FINDINGS: BRAIN: No intraparenchymal hemorrhage, mass effect nor midline shift. Old RIGHT cerebellar infarcts. Confluent pontine and supratentorial white matter hypodensities. Old RIGHT basal ganglia infarcts with ex vacuo dilatation RIGHT frontal horn of the lateral ventricle. No acute large vascular territory infarcts. No abnormal extra-axial fluid collections. Basal cisterns are patent. VASCULAR: Moderate calcific atherosclerosis of the carotid siphons. SKULL: No skull fracture. No  significant scalp soft tissue swelling. SINUSES/ORBITS: Mild paranasal sinus mucosal thickening. Mastoid air cells are well aerated.LEFT buphthalmos and ocular lens implant. OTHER: Patient is edentulous. ASPECTS [facility] Stroke Program Early CT Score) - Ganglionic level infarction (caudate, lentiform nuclei, internal capsule, insula, M1-M3 cortex): 7 - Supraganglionic infarction (M4-M6 cortex): 3 Total score (0-10 with 10 being normal): 10 IMPRESSION: 1. No acute intracranial process. 2. ASPECTS is 10. 3. Moderate to severe chronic small vessel ischemic changes. 4. 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 5. Critical Value/emergent results text paged to [place], Neurology via [place] secure system on 01/01/2018 at 3:27 am, including interpreting physician's phone number. Electronically Signed   By: Elon Alas M.D.   On: 01/01/2018 03:27   Vas [place] Lower Extremity Venous (dvt)  Result Date: 01/03/2018  Lower Venous Study Indications: Stroke.  Limitations: Body habitus. Performing Technologist: Sharion Dove RVS  Examination Guidelines: A complete evaluation includes B-mode imaging, spectral Doppler, color Doppler, and power Doppler as needed of all accessible portions of each vessel. Bilateral testing is considered an integral part of a complete examination. Limited examinations for reoccurring indications may be performed as noted.   Right Venous Findings: +---------+---------------+---------+-----------+----------+-------+          CompressibilityPhasicitySpontaneityPropertiesSummary +---------+---------------+---------+-----------+----------+-------+ CFV      Full           Yes      Yes                          +---------+---------------+---------+-----------+----------+-------+ SFJ      Full                                                 +---------+---------------+---------+-----------+----------+-------+ FV Prox  Full                                                 +---------+---------------+---------+-----------+----------+-------+ FV Mid   Full                                                 +---------+---------------+---------+-----------+----------+-------+ FV DistalFull                                                 +---------+---------------+---------+-----------+----------+-------+ PFV      Full                                                 +---------+---------------+---------+-----------+----------+-------+ POP      Full           Yes      Yes                          +---------+---------------+---------+-----------+----------+-------+ PTV      Full                                                 +---------+---------------+---------+-----------+----------+-------+  Right Technical Findings: Not visualized segments include peroneal vein.  Left Venous Findings: +---------+---------------+---------+-----------+----------+-------+          CompressibilityPhasicitySpontaneityPropertiesSummary +---------+---------------+---------+-----------+----------+-------+ CFV      Full                                                 +---------+---------------+---------+-----------+----------+-------+ SFJ      Full                                                 +---------+---------------+---------+-----------+----------+-------+ FV Prox  Full                                                  +---------+---------------+---------+-----------+----------+-------+ FV Mid   Full                                                 +---------+---------------+---------+-----------+----------+-------+ FV DistalFull                                                 +---------+---------------+---------+-----------+----------+-------+ PFV      Full                                                 +---------+---------------+---------+-----------+----------+-------+ POP      Full                                                 +---------+---------------+---------+-----------+----------+-------+ PTV      Full                                                 +---------+---------------+---------+-----------+----------+-------+  Left Technical Findings: Not visualized segments include peroneal vein.   Summary: Right: There is no evidence of deep vein thrombosis in the lower extremity. However, portions of this examination were limited- see technologist comments above. Left: There is no evidence of deep vein thrombosis in the lower extremity. However, portions of this examination were limited- see technologist comments above.  *See table(s) above for measurements and observations. Electronically signed by Ruta Hinds MD on 01/03/2018 at 6:56:01 PM.    Final     Micro Results     No results found for this or any previous visit (from the past 240 hour(s)).  Today   Subjective    [name] today has no headache,no chest abdominal pain,no new weakness tingling or numbness, feels much better  Objective   Blood pressure (!) 209/104, pulse 79, temperature 98.9 F (37.2 C), temperature source Oral, resp. rate (!) 22, height 6\' 2"  (1.88 m), weight 127.5 kg, SpO2 98 %.   Intake/Output Summary (Last 24 hours) at 01/04/2018 0946 Last data filed at 01/04/2018 0502 Gross per 24 hour  Intake 1840.41 ml  Output 450 ml  Net 1390.41 ml     Exam  Awake ,  left arm 1/5, left leg 3/5, right-sided extremities 5/5, does have mild left-sided neglect and left-sided nasolabial flattening [place].AT,PERRAL Supple Neck,No JVD, No cervical lymphadenopathy appriciated.  Symmetrical Chest wall movement, Good air movement bilaterally, CTAB RRR,No Gallops, Rubs or new Murmurs, No Parasternal Heave +ve B.Sounds, Abd Soft, No tenderness, No organomegaly appriciated, No rebound - guarding or rigidity. No Cyanosis, Clubbing or edema, No new Rash or bruise    Data Review   CBC w Diff:  Lab Results  Component Value Date   WBC 10.5 01/04/2018   HGB 12.5 (L) 01/04/2018   HGB 13.0 09/07/2017   HCT 39.8 01/04/2018   HCT 38.9 09/07/2017   PLT 243 01/04/2018   PLT 220 09/07/2017   LYMPHOPCT 39 01/01/2018   MONOPCT 11 01/01/2018   EOSPCT 4 01/01/2018   BASOPCT 0 01/01/2018    CMP:  Lab Results  Component Value Date   NA 143 01/03/2018   NA 143 09/07/2017   K 3.9 01/03/2018   CL 113 (H) 01/03/2018   CO2 27 01/03/2018   BUN 14 01/03/2018   BUN 21 09/07/2017   CREATININE 1.36 (H) 01/03/2018   PROT 7.5 01/01/2018   PROT 7.5 09/07/2017   ALBUMIN 3.5 01/01/2018   ALBUMIN 4.1 09/07/2017   BILITOT 0.3 01/01/2018   BILITOT 0.5 09/07/2017   ALKPHOS 52 01/01/2018   AST 19 01/01/2018   ALT 14 01/01/2018   Lab Results  Component Value Date   HGBA1C 5.8 (H) 01/01/2018   Lab Results  Component Value Date   CHOL 127 01/01/2018   HDL 39 (L) 01/01/2018   LDLCALC 73 01/01/2018   TRIG 73 01/01/2018   CHOLHDL 3.3 01/01/2018    Total Time in preparing paper work, data evaluation and todays exam - 79 minutes  Lala Lund M.D on 01/04/2018 at 9:46 AM  [facility]   Office  [phone]

## 2018-01-04 NOTE — Progress Notes (Signed)
Physical Therapy Treatment Patient Details Name: [name] MRN: [id number] DOB: August 12, 1940 Today's Date: 01/04/2018    History of Present Illness Pt is a 77 y.o. male admitted 01/01/18 after being found down at home by wife, noted to have L facial droop, L-side weakness and dysarthria. 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 PMH includes HTN, CVA (2000), OSA, DJD, cataracts, CAD.    PT Comments    Pt was laying in bed with family present upon PT arrival. Pt was wasn't oriented to the time. Pt stated his neck pain was 10/10 before treatment, hot pack was applied at end of session. He required Mod A for rolling and Max A to power up from sidelying to sitting on EOB.  Pt was able to perform sit to stand with Mod A with raised bed and required assist to keep L UE positioned on walker. He ambulated with RW for 20 ft with Mod A + 2 with close chair follow and needed assist to progress LLE. Pt continues to progress toward stated goals and would benefit from continued PT in order to increase functional independence. Pt remains appropriate for CIR placement for d/c based on current functional status.    Follow Up Recommendations  CIR;Supervision/Assistance - 24 hour     Equipment Recommendations  Rolling walker with 5" wheels    Recommendations for Other Services Rehab consult     Precautions / Restrictions Precautions Precautions: Fall Restrictions Weight Bearing Restrictions: No    Mobility  Bed Mobility Overal bed mobility: Needs Assistance Bed Mobility: Rolling;Sidelying to Sit Rolling: Mod assist Sidelying to sit: HOB elevated;Max assist       General bed mobility comments: pt unable to use L UE functionally but was able to bring bilat LE out of bed. Pt required assist to get hips aligned, scoot to EOB and bed was elevated to assist in transfer.  Transfers Overall transfer level:  Needs assistance Equipment used: Rolling walker (2 wheeled) Transfers: Sit to/from Stand Sit to Stand: Mod assist;+2 safety/equipment;+2 physical assistance         General transfer comment: pt with improved power up, modA to maintain balance during transition of hands, modA to bring L UE up on to RW, modA to maintain grip on walker as well  Ambulation/Gait Ambulation/Gait assistance: Mod assist;+2 safety/equipment Gait Distance (Feet): 20 Feet Assistive device: Rolling walker (2 wheeled) Gait Pattern/deviations: Step-to pattern;Decreased stride length;Decreased stance time - left;Decreased stance time - right;Decreased step length - left;Narrow base of support;Drifts right/left     General Gait Details: pt with decreased L foot clearance and step length, pt with constant gaze to the R and vearing right requiring max directional verbal cues and modA for walker management. with onset of fatigue with with increased L lateral lean. Required Assist to hold L hand on RW, and to advance LLE during amb. Pt tended to drag LLE to R with poor sequencing.   Stairs             Wheelchair Mobility    Modified Rankin (Stroke Patients Only) Modified Rankin (Stroke Patients Only) Pre-Morbid Rankin Score: No symptoms Modified Rankin: Moderately severe disability     Balance Overall balance assessment: Needs assistance Sitting-balance support: Feet supported Sitting balance-Leahy Scale: Fair Sitting balance - Comments: able to sit with close supervision   Standing balance support: Bilateral upper extremity supported Standing balance-Leahy Scale: Poor Standing balance comment: dependent on  RW                            Cognition Arousal/Alertness: Awake/alert Behavior During Therapy: WFL for tasks assessed/performed Overall Cognitive Status: Impaired/Different from baseline Area of Impairment: Safety/judgement                 Orientation Level: Disoriented  to;Time Current Attention Level: Sustained   Following Commands: Follows one step commands inconsistently Safety/Judgement: Decreased awareness of safety;Decreased awareness of deficits Awareness: Emergent Problem Solving: Slow processing;Difficulty sequencing;Requires verbal cues;Requires tactile cues General Comments: Pt with R gaze preferance, L sided neglect      Exercises Total Joint Exercises Long Arc Quad: AROM;Right;Left;Seated;10 reps    General Comments        Pertinent Vitals/Pain Pain Assessment: 0-10 Pain Score: 10-Worst pain ever Pain Location: neck Pain Descriptors / Indicators: Aching Pain Intervention(s): Limited activity within patient's tolerance;Monitored during session;Repositioned;Heat applied    Home Living                      Prior Function            PT Goals (current goals can now be found in the care plan section) Acute Rehab PT Goals Patient Stated Goal: to be able to talk normal again PT Goal Formulation: With patient Time For Goal Achievement: 01/16/18 Potential to Achieve Goals: Fair Progress towards PT goals: Progressing toward goals    Frequency    Min 4X/week      PT Plan Current plan remains appropriate    Co-evaluation              AM-PAC PT "6 Clicks" Daily Activity  Outcome Measure  Difficulty turning over in bed (including adjusting bedclothes, sheets and blankets)?: Unable Difficulty moving from lying on back to sitting on the side of the bed? : A Lot Difficulty sitting down on and standing up from a chair with arms (e.g., wheelchair, bedside commode, etc,.)?: A Lot Help needed moving to and from a bed to chair (including a wheelchair)?: A Lot Help needed walking in hospital room?: A Lot Help needed climbing 3-5 steps with a railing? : Total 6 Click Score: 10    End of Session Equipment Utilized During Treatment: Gait belt Activity Tolerance: Patient tolerated treatment well Patient left: in  chair;with call bell/phone within reach;with chair alarm set;with family/visitor present;with nursing/sitter in room Nurse Communication: Mobility status PT Visit Diagnosis: Other abnormalities of gait and mobility (R26.89);Muscle weakness (generalized) (M62.81)     Time: 1325-1350 PT Time Calculation (min) (ACUTE ONLY): 25 min  Charges:  $Gait Training: 8-22 mins $Therapeutic Activity: 8-22 mins                     [address], SPTA   [place] 01/04/2018, 4:42 PM

## 2018-01-04 NOTE — Progress Notes (Addendum)
  Speech Language Pathology Treatment: Dysphagia  Patient Details Name: CECILIO OHLRICH MRN: [id number] DOB: [birth year] Today's Date: 01/04/2018 Time: 2683-4196 SLP Time Calculation (min) (ACUTE ONLY): 35 min  Assessment / Plan / Recommendation Clinical Impression  Pt today is sleepy but would awaken for SLP intervention and RN to provide medications.  Per daughter, pt doesn't deal well with medications and it takes a "long time" for them to get out of his system.  RN reports pt was lethargic yesterday prior to his procedures.    Educated pt/daughter to findings of MBS and clinical reasoning for swallow precautions.  Daughter reports pt did well eating this weekend.     Pt does report dry mouth and SLP provided him with toothette for oral moisture prior to medication administration.  No indication of airway compromise - delayed swallow noted with pt benefiting from verbal cues to swallow = suspect combination of delayed swallow with decreased mentation.  Observed pt consuming thin soda via Provale cup = cough x1 which was significant - due to pt not swallowing prior to placing more in his oral cavity.   Soda thickened was enjoyed by pt per his statement *the taste is still there, it's just thick" with good tolerance via straw.    Pt significantly dysarthric today, baseline deficit exacerbated by his sleepiness - family understands him better than this SLP.  Pt continued with sleepiness, therefore SLP ended session.  Of note, pt reports he slept a lot during the day prior to admission - he also advised his wife informed his PSP of this finding.   Demonstrated Provale cup use and thickening liquids with daughter. Pt making progress in po intake however largest barrier currently is his mentation/sleepiness.  CIR continues to be recommended to maximize his function and decrease caregiver burden.   HPI HPI: pt is a 77 yo adm to [facility] after being found on the floor.  pmh + for cva, cad s/p cabg, cervical  spondylosis, ckd, htn, sleep apnea.  pt with left facial droop and dysarthria as well as left sided weakness.  speech and swallow eval ordered.  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        SLP Plan  Continue with current plan of care       Recommendations  Diet recommendations: Dysphagia 1 (puree);Nectar-thick liquid;Other(comment)(thin via provale cup or tsp) Liquids provided via: Cup;Straw(tsp thin ok, cup/straw with nectar) Medication Administration: Crushed with puree Supervision: Staff to assist with self feeding;Full supervision/cueing for compensatory strategies Compensations: Small sips/bites;Slow rate;Follow solids with liquid;Other (Comment)(intermittent dry swallow) Postural Changes and/or Swallow Maneuvers: Seated upright 90 degrees;Upright 30-60 min after meal                Oral Care Recommendations: Oral care QID Follow up Recommendations: Inpatient Rehab SLP Visit Diagnosis: Dysphagia, oropharyngeal phase (R13.12) Plan: Continue with current plan of care       GO                Macario Golds 01/04/2018, 9:39 AM  Luanna Salk, [facility] SLP Jud Pager [phone] Office [phone]

## 2018-01-04 NOTE — Care Management Important Message (Signed)
Important Message  Patient Details  Name: [name] MRN: [id number] Date of Birth: 02/18/1941   Medicare Important Message Given:  Yes    [name] 01/04/2018, 2:57 PM

## 2018-01-04 NOTE — Discharge Instructions (Signed)
Follow with Primary MD Rogers Blocker, MD in 7 days   Get CBC, CMP, 2 view Chest X ray -  checked  by Primary MD or SNF MD in 5-7 days   Activity: As tolerated with Full fall precautions use walker/cane & assistance as needed  Disposition SNF/CIR  Diet: Dysphagia 1 diet-nectar thick liquids, with full feeding assistance and aspiration precautions.  Special Instructions: If you have smoked or chewed Tobacco  in the last 2 yrs please stop smoking, stop any regular Alcohol  and or any Recreational drug use.  On your next visit with your primary care physician please Get Medicines reviewed and adjusted.  Please request your Prim.MD to go over all Hospital Tests and Procedure/Radiological results at the follow up, please get all Hospital records sent to your Prim MD by signing hospital release before you go home.  If you experience worsening of your admission symptoms, develop shortness of breath, life threatening emergency, suicidal or homicidal thoughts you must seek medical attention immediately by calling 911 or calling your MD immediately  if symptoms less severe.

## 2018-01-05 ENCOUNTER — Other Ambulatory Visit: Payer: Self-pay

## 2018-01-05 ENCOUNTER — Inpatient Hospital Stay (HOSPITAL_COMMUNITY)
Admission: RE | Admit: 2018-01-05 | Discharge: 2018-01-22 | DRG: 057 | Disposition: A | Discharge status: Home Health | Payer: Medicare Other | Source: Intra-hospital | Attending: Physical Medicine & Rehabilitation | Admitting: Physical Medicine & Rehabilitation

## 2018-01-05 ENCOUNTER — Encounter (HOSPITAL_COMMUNITY): Payer: Self-pay | Admitting: *Deleted

## 2018-01-05 DIAGNOSIS — M109 Gout, unspecified: Secondary | ICD-10-CM | POA: Diagnosis present

## 2018-01-05 DIAGNOSIS — D638 Anemia in other chronic diseases classified elsewhere: Secondary | ICD-10-CM

## 2018-01-05 DIAGNOSIS — G8114 Spastic hemiplegia affecting left nondominant side: Secondary | ICD-10-CM | POA: Diagnosis not present

## 2018-01-05 DIAGNOSIS — R29710 NIHSS score 10: Secondary | ICD-10-CM | POA: Diagnosis present

## 2018-01-05 DIAGNOSIS — Z86718 Personal history of other venous thrombosis and embolism: Secondary | ICD-10-CM | POA: Diagnosis not present

## 2018-01-05 DIAGNOSIS — G4733 Obstructive sleep apnea (adult) (pediatric): Secondary | ICD-10-CM | POA: Diagnosis present

## 2018-01-05 DIAGNOSIS — I69398 Other sequelae of cerebral infarction: Secondary | ICD-10-CM | POA: Diagnosis not present

## 2018-01-05 DIAGNOSIS — N183 Chronic kidney disease, stage 3 unspecified: Secondary | ICD-10-CM

## 2018-01-05 DIAGNOSIS — I252 Old myocardial infarction: Secondary | ICD-10-CM

## 2018-01-05 DIAGNOSIS — Z823 Family history of stroke: Secondary | ICD-10-CM | POA: Diagnosis not present

## 2018-01-05 DIAGNOSIS — Z888 Allergy status to other drugs, medicaments and biological substances status: Secondary | ICD-10-CM | POA: Diagnosis not present

## 2018-01-05 DIAGNOSIS — I69354 Hemiplegia and hemiparesis following cerebral infarction affecting left non-dominant side: Secondary | ICD-10-CM | POA: Diagnosis present

## 2018-01-05 DIAGNOSIS — H53452 Other localized visual field defect, left eye: Secondary | ICD-10-CM

## 2018-01-05 DIAGNOSIS — I6521 Occlusion and stenosis of right carotid artery: Secondary | ICD-10-CM | POA: Diagnosis present

## 2018-01-05 DIAGNOSIS — I672 Cerebral atherosclerosis: Secondary | ICD-10-CM | POA: Diagnosis present

## 2018-01-05 DIAGNOSIS — M25519 Pain in unspecified shoulder: Secondary | ICD-10-CM | POA: Diagnosis present

## 2018-01-05 DIAGNOSIS — E669 Obesity, unspecified: Secondary | ICD-10-CM | POA: Diagnosis present

## 2018-01-05 DIAGNOSIS — E785 Hyperlipidemia, unspecified: Secondary | ICD-10-CM | POA: Diagnosis present

## 2018-01-05 DIAGNOSIS — Z8249 Family history of ischemic heart disease and other diseases of the circulatory system: Secondary | ICD-10-CM | POA: Diagnosis not present

## 2018-01-05 DIAGNOSIS — R509 Fever, unspecified: Secondary | ICD-10-CM | POA: Diagnosis not present

## 2018-01-05 DIAGNOSIS — N179 Acute kidney failure, unspecified: Secondary | ICD-10-CM | POA: Diagnosis present

## 2018-01-05 DIAGNOSIS — I639 Cerebral infarction, unspecified: Secondary | ICD-10-CM | POA: Diagnosis present

## 2018-01-05 DIAGNOSIS — Z79899 Other long term (current) drug therapy: Secondary | ICD-10-CM

## 2018-01-05 DIAGNOSIS — I2510 Atherosclerotic heart disease of native coronary artery without angina pectoris: Secondary | ICD-10-CM

## 2018-01-05 DIAGNOSIS — I69322 Dysarthria following cerebral infarction: Secondary | ICD-10-CM

## 2018-01-05 DIAGNOSIS — Z789 Other specified health status: Secondary | ICD-10-CM

## 2018-01-05 DIAGNOSIS — I10 Essential (primary) hypertension: Secondary | ICD-10-CM

## 2018-01-05 DIAGNOSIS — I63512 Cerebral infarction due to unspecified occlusion or stenosis of left middle cerebral artery: Secondary | ICD-10-CM | POA: Diagnosis not present

## 2018-01-05 DIAGNOSIS — G8194 Hemiplegia, unspecified affecting left nondominant side: Secondary | ICD-10-CM

## 2018-01-05 DIAGNOSIS — H538 Other visual disturbances: Secondary | ICD-10-CM | POA: Diagnosis present

## 2018-01-05 DIAGNOSIS — I69392 Facial weakness following cerebral infarction: Secondary | ICD-10-CM

## 2018-01-05 DIAGNOSIS — M1990 Unspecified osteoarthritis, unspecified site: Secondary | ICD-10-CM | POA: Diagnosis present

## 2018-01-05 DIAGNOSIS — I63511 Cerebral infarction due to unspecified occlusion or stenosis of right middle cerebral artery: Secondary | ICD-10-CM

## 2018-01-05 DIAGNOSIS — Z6834 Body mass index (BMI) 34.0-34.9, adult: Secondary | ICD-10-CM | POA: Diagnosis not present

## 2018-01-05 DIAGNOSIS — I69391 Dysphagia following cerebral infarction: Secondary | ICD-10-CM

## 2018-01-05 DIAGNOSIS — R609 Edema, unspecified: Secondary | ICD-10-CM

## 2018-01-05 DIAGNOSIS — R32 Unspecified urinary incontinence: Secondary | ICD-10-CM | POA: Diagnosis present

## 2018-01-05 DIAGNOSIS — Z951 Presence of aortocoronary bypass graft: Secondary | ICD-10-CM

## 2018-01-05 DIAGNOSIS — Z741 Need for assistance with personal care: Secondary | ICD-10-CM | POA: Diagnosis present

## 2018-01-05 DIAGNOSIS — Z8261 Family history of arthritis: Secondary | ICD-10-CM

## 2018-01-05 DIAGNOSIS — N182 Chronic kidney disease, stage 2 (mild): Secondary | ICD-10-CM

## 2018-01-05 DIAGNOSIS — Z87891 Personal history of nicotine dependence: Secondary | ICD-10-CM | POA: Diagnosis not present

## 2018-01-05 DIAGNOSIS — Z9119 Patient's noncompliance with other medical treatment and regimen: Secondary | ICD-10-CM | POA: Diagnosis not present

## 2018-01-05 DIAGNOSIS — Z8739 Personal history of other diseases of the musculoskeletal system and connective tissue: Secondary | ICD-10-CM

## 2018-01-05 DIAGNOSIS — D631 Anemia in chronic kidney disease: Secondary | ICD-10-CM | POA: Diagnosis present

## 2018-01-05 DIAGNOSIS — R600 Localized edema: Secondary | ICD-10-CM

## 2018-01-05 DIAGNOSIS — R3915 Urgency of urination: Secondary | ICD-10-CM | POA: Diagnosis present

## 2018-01-05 DIAGNOSIS — R1311 Dysphagia, oral phase: Secondary | ICD-10-CM | POA: Diagnosis present

## 2018-01-05 DIAGNOSIS — I129 Hypertensive chronic kidney disease with stage 1 through stage 4 chronic kidney disease, or unspecified chronic kidney disease: Secondary | ICD-10-CM | POA: Diagnosis present

## 2018-01-05 DIAGNOSIS — G460 Middle cerebral artery syndrome: Secondary | ICD-10-CM | POA: Diagnosis not present

## 2018-01-05 LAB — CBC
HCT: 38.8 % — ABNORMAL LOW (ref 39.0–52.0)
Hemoglobin: 12.1 g/dL — ABNORMAL LOW (ref 13.0–17.0)
MCH: 26.3 pg (ref 26.0–34.0)
MCHC: 31.2 g/dL (ref 30.0–36.0)
MCV: 84.3 fL (ref 80.0–100.0)
Platelets: 239 10*3/uL (ref 150–400)
RBC: 4.6 MIL/uL (ref 4.22–5.81)
RDW: 16.2 % — ABNORMAL HIGH (ref 11.5–15.5)
WBC: 11.4 10*3/uL — ABNORMAL HIGH (ref 4.0–10.5)
nRBC: 0 % (ref 0.0–0.2)

## 2018-01-05 MED ORDER — ALUM & MAG HYDROXIDE-SIMETH 200-200-20 MG/5ML PO SUSP: 30.0000 mL | ORAL | Status: DC | PRN

## 2018-01-05 MED ORDER — SODIUM CHLORIDE 0.9 % IV SOLN
INTRAVENOUS | Status: DC
  Administered 2018-01-06 – 2018-01-10 (×6): via INTRAVENOUS
  Filled 2018-01-05 (×7): qty 1000

## 2018-01-05 MED ORDER — PROCHLORPERAZINE 25 MG RE SUPP: 12.5000 mg | Freq: Four times a day (QID) | RECTAL | Status: DC | PRN

## 2018-01-05 MED ORDER — ALLOPURINOL 100 MG PO TABS
200.0000 mg | ORAL_TABLET | Freq: Every day | ORAL | Status: DC
  Administered 2018-01-06 – 2018-01-22 (×17): 200 mg via ORAL
  Filled 2018-01-05 (×17): qty 2

## 2018-01-05 MED ORDER — NIACIN ER (ANTIHYPERLIPIDEMIC) 500 MG PO TBCR
500.0000 mg | EXTENDED_RELEASE_TABLET | Freq: Every day | ORAL | Status: DC
  Administered 2018-01-05 – 2018-01-21 (×17): 500 mg via ORAL
  Filled 2018-01-05 (×17): qty 1

## 2018-01-05 MED ORDER — DICLOFENAC SODIUM 1 % TD GEL
2.0000 g | Freq: Two times a day (BID) | TRANSDERMAL | Status: AC
  Administered 2018-01-05 – 2018-01-07 (×4): 2 g via TOPICAL
  Filled 2018-01-05: qty 100

## 2018-01-05 MED ORDER — TRAZODONE HCL 50 MG PO TABS
25.0000 mg | ORAL_TABLET | Freq: Every evening | ORAL | Status: DC | PRN
  Administered 2018-01-17 – 2018-01-20 (×3): 50 mg via ORAL
  Filled 2018-01-05 (×4): qty 1

## 2018-01-05 MED ORDER — POLYETHYLENE GLYCOL 3350 17 G PO PACK
17.0000 g | PACK | Freq: Two times a day (BID) | ORAL | Status: DC
  Administered 2018-01-05 – 2018-01-21 (×27): 17 g via ORAL
  Filled 2018-01-05 (×32): qty 1

## 2018-01-05 MED ORDER — PANTOPRAZOLE SODIUM 40 MG PO TBEC
40.0000 mg | DELAYED_RELEASE_TABLET | Freq: Every day | ORAL | Status: DC
  Administered 2018-01-06 – 2018-01-22 (×17): 40 mg via ORAL
  Filled 2018-01-05 (×17): qty 1

## 2018-01-05 MED ORDER — RESOURCE THICKENUP CLEAR PO POWD
ORAL | Status: DC | PRN
  Filled 2018-01-05: qty 125

## 2018-01-05 MED ORDER — METOPROLOL TARTRATE 25 MG PO TABS
25.0000 mg | ORAL_TABLET | Freq: Two times a day (BID) | ORAL | Status: DC
  Administered 2018-01-05 – 2018-01-22 (×34): 25 mg via ORAL
  Filled 2018-01-05 (×34): qty 1

## 2018-01-05 MED ORDER — PROCHLORPERAZINE EDISYLATE 10 MG/2ML IJ SOLN: 5.0000 mg | Freq: Four times a day (QID) | INTRAMUSCULAR | Status: DC | PRN

## 2018-01-05 MED ORDER — FLEET ENEMA 7-19 GM/118ML RE ENEM: 1.0000 | ENEMA | Freq: Once | RECTAL | Status: DC | PRN

## 2018-01-05 MED ORDER — BISACODYL 10 MG RE SUPP
10.0000 mg | Freq: Every day | RECTAL | Status: DC | PRN
  Administered 2018-01-11: 10 mg via RECTAL
  Filled 2018-01-05 (×2): qty 1

## 2018-01-05 MED ORDER — CLOPIDOGREL BISULFATE 75 MG PO TABS
75.0000 mg | ORAL_TABLET | Freq: Every day | ORAL | Status: DC
  Administered 2018-01-06 – 2018-01-22 (×17): 75 mg via ORAL
  Filled 2018-01-05 (×17): qty 1

## 2018-01-05 MED ORDER — TAMSULOSIN HCL 0.4 MG PO CAPS
0.4000 mg | ORAL_CAPSULE | Freq: Every day | ORAL | Status: DC
  Administered 2018-01-06 – 2018-01-22 (×17): 0.4 mg via ORAL
  Filled 2018-01-05 (×17): qty 1

## 2018-01-05 MED ORDER — AMLODIPINE BESYLATE 5 MG PO TABS
5.0000 mg | ORAL_TABLET | Freq: Every day | ORAL | Status: DC
  Administered 2018-01-06 – 2018-01-22 (×17): 5 mg via ORAL
  Filled 2018-01-05 (×17): qty 1

## 2018-01-05 MED ORDER — GUAIFENESIN-DM 100-10 MG/5ML PO SYRP: 5.0000 mL | ORAL_SOLUTION | Freq: Four times a day (QID) | ORAL | Status: DC | PRN

## 2018-01-05 MED ORDER — ENOXAPARIN SODIUM 40 MG/0.4ML ~~LOC~~ SOLN
40.0000 mg | SUBCUTANEOUS | Status: DC
  Administered 2018-01-05 – 2018-01-21 (×17): 40 mg via SUBCUTANEOUS
  Filled 2018-01-05 (×17): qty 0.4

## 2018-01-05 MED ORDER — DIPHENHYDRAMINE HCL 12.5 MG/5ML PO ELIX
12.5000 mg | ORAL_SOLUTION | Freq: Four times a day (QID) | ORAL | Status: DC | PRN
  Administered 2018-01-11: 12.5 mg via ORAL
  Filled 2018-01-05: qty 10

## 2018-01-05 MED ORDER — MUSCLE RUB 10-15 % EX CREA
TOPICAL_CREAM | Freq: Three times a day (TID) | CUTANEOUS | Status: DC
  Administered 2018-01-05 – 2018-01-06 (×4): via TOPICAL
  Administered 2018-01-06: 1 via TOPICAL
  Administered 2018-01-06 – 2018-01-13 (×23): via TOPICAL
  Administered 2018-01-13: 1 via TOPICAL
  Administered 2018-01-13 – 2018-01-22 (×20): via TOPICAL
  Filled 2018-01-05 (×3): qty 85

## 2018-01-05 MED ORDER — PROCHLORPERAZINE MALEATE 5 MG PO TABS: 5.0000 mg | ORAL_TABLET | Freq: Four times a day (QID) | ORAL | Status: DC | PRN

## 2018-01-05 MED ORDER — ACETAMINOPHEN 325 MG PO TABS
325.0000 mg | ORAL_TABLET | ORAL | Status: DC | PRN
  Administered 2018-01-05 – 2018-01-18 (×7): 650 mg via ORAL
  Filled 2018-01-05 (×8): qty 2

## 2018-01-05 MED ORDER — ROSUVASTATIN CALCIUM 20 MG PO TABS
20.0000 mg | ORAL_TABLET | Freq: Every day | ORAL | Status: DC
  Administered 2018-01-05 – 2018-01-21 (×17): 20 mg via ORAL
  Filled 2018-01-05 (×17): qty 1

## 2018-01-05 MED ORDER — ASPIRIN EC 81 MG PO TBEC
81.0000 mg | DELAYED_RELEASE_TABLET | Freq: Every day | ORAL | Status: DC
  Administered 2018-01-06 – 2018-01-22 (×17): 81 mg via ORAL
  Filled 2018-01-05 (×17): qty 1

## 2018-01-05 NOTE — Care Management Note (Signed)
Case Management Note  Patient Details  Name: [name] MRN: [id number] Date of Birth: September 10, 1940  Subjective/Objective:   Pt admitted with a stroke. He is from home with spouse.                  Action/Plan: Pt discharging to CIR today. CM signing off.   Expected Discharge Date:  01/05/18               Expected Discharge Plan:  [place]  In-House Referral:     Discharge planning Services  CM Consult  Post Acute Care Choice:    Choice offered to:     DME Arranged:    DME Agency:     HH Arranged:    HH Agency:     Status of Service:  Completed, signed off  If discussed at [employer], dates discussed:    Additional Comments:  Pollie Friar, RN 01/05/2018, 11:53 AM

## 2018-01-05 NOTE — Progress Notes (Signed)
IP rehab admissions - I met with patient and his wife and daughter at the bedside yesterday.  I gave them rehab booklets and explained inpatient rehab program.  I have opened the case and submitted clinicals requesting acute inpatient rehab admission with [facility] insurance carrier.  I will update all once I hear back from insurance case manager.  Call me for questions.  [phone]

## 2018-01-05 NOTE — Progress Notes (Signed)
Jamse Arn, MD  Physician  Physical Medicine and Rehabilitation  Consult Note  Signed  Date of Service:  01/03/2018 9:05 AM       Related encounter: ED to Hosp-Admission (Discharged) from 01/01/2018 in [place] [facility]      Signed      Expand All Collapse All    Show:Clear all [x] Manual[x] Template[] Copied  Added by: [x] Love, Ivan Anchors, PA-C[x] Jamse Arn, MD  [] Hover for details      Physical Medicine and Rehabilitation Consult   Reason for Consult: Stroke with functional deficits Referring Physician: Dr. Candiss Norse   HPI: [name] is a 77 y.o. male with history of CAD, DJD, OSA--non compliant due to frequency,  h/o DVTs, CVA; who was admitted on 01/01/18 with acute onset of left facial weakness with dysarthria, left sided weakness and fall.  History taken from chart review, family, and patient.  CT perfusion of head done revealing small acute right frontal/MCA penumbra and CTA head/neck showed moderate cerebral artery atherosclerosis with mid stenosis R-VA origin. MRI brain showed reviewed, showing multiple right infarcts.  Per report, acute/subacute infarcts involving right frontal operculum and diffusely along right frontal and parietal lobes and remote hemorrhagic infarct in right thalamus.  Patient with lethargy with wet voice and difficulty handling secretions. MBS done on 11/3 and he was started on dysphagia 1, nectar liquids with strict aspiration precautions.  2D echo done revealing EF 60-65% with no wall abnormalities and aortic sclerosis without stenosis. BLE dopplers were negative for DVT.  Carotid Doppler showed right ICA bifurcation large soft plaque however no significant stenosis. He was started on aspirin Plavix for embolic stroke felt to be from R-ICA soft plaque versus cardioembolic source.  TEE ordered for work-up.  Patient on aspirin Plavix for secondary stroke prevention  Review of Systems  Constitutional: Positive for  malaise/fatigue.  HENT: Negative for hearing loss and tinnitus.   Eyes: Negative for blurred vision and double vision.  Respiratory: Positive for cough. Negative for shortness of breath.   Cardiovascular: Positive for leg swelling. Negative for chest pain and palpitations.  Gastrointestinal: Negative for heartburn and nausea.  Genitourinary: Positive for frequency and urgency. Negative for dysuria.       Gets up four times at nighs.   Musculoskeletal: Positive for falls. Negative for myalgias.  Skin: Negative for rash.  Neurological: Positive for speech change, focal weakness, weakness and headaches.  All other systems reviewed and are negative.      Past Medical History:  Diagnosis Date  . CAD (coronary artery disease)    s/p cath in 2005 and CABG x4  . Cataract   . DJD (degenerative joint disease)   . DJD (degenerative joint disease)   . Fluid retention    mild  . H/O: gout   . History of blood clots   . HTN (hypertension)   . Hyperlipidemia   . Hyperlipidemia   . Myocardial infarction ([place]) 2009  . Obesity    with reduction  . OSA (obstructive sleep apnea)    AHI 98/hr  . Renal insufficiency   . Sleep apnea   . Stroke [facility]) 2000         Past Surgical History:  Procedure Laterality Date  . CORONARY ARTERY BYPASS GRAFT  2005   LIMA to LAD, SVG to OM1 and OM 2, RCA.   [name] left inguinal hernia repair    . TEE WITHOUT CARDIOVERSION N/A 12/11/2016   Procedure: TRANSESOPHAGEAL ECHOCARDIOGRAM (TEE);  Surgeon: Debara Pickett,  Nadean Corwin, MD;  Location[facility] ENDOSCOPY;  Service: Cardiovascular;  Laterality: N/A;         Family History  Problem Relation Age of Onset  . Gout Mother   . Stroke Mother   . Coronary artery disease Mother   . Hypertension Mother   . Arthritis Mother   . Coronary artery disease Father   . Arthritis Father   . Gout Father   . Stroke Father   . Hypertension Father   . Coronary artery disease Brother   . Arthritis  Brother   . Gout Brother   . Stroke Brother   . Hypertension Brother   . Coronary artery disease Brother   . Arthritis Brother   . Gout Brother   . Stroke Brother   . Hypertension Brother   . Coronary artery disease Unknown        significant in multiple family members    Social History:  Married. He reports that he has quit smoking--2011. His smoking use included cigars. He has never used smokeless tobacco. He reports that he does not drink alcohol or use drugs.   Allergies  Allergen Reactions  . Ramipril Cough          Medications Prior to Admission  Medication Sig Dispense Refill  . allopurinol (ZYLOPRIM) 100 MG tablet Take 200 mg by mouth daily.     [name] aspirin EC 81 MG tablet Take 81 mg by mouth daily.    . furosemide (LASIX) 20 MG tablet Take 20 mg by mouth 2 (two) times daily.      [name] lovastatin (MEVACOR) 20 MG tablet TAKE ONE TABLET BY MOUTH TWICE DAILY. MUST HAVE OFFICE VISIT BEFORE FURTHER REFILL. (Patient taking differently: Take 20 mg by mouth 2 (two) times daily. ) 60 tablet 6  . metoprolol tartrate (LOPRESSOR) 25 MG tablet TAKE ONE TABLET BY MOUTH TWICE DAILY (Patient taking differently: Take 25 mg by mouth 2 (two) times daily. ) 30 tablet 0  . montelukast (SINGULAIR) 10 MG tablet Take 10 mg by mouth at bedtime.    . niacin (NIASPAN) 500 MG CR tablet Take 500 mg by mouth at bedtime.      [name] omeprazole (PRILOSEC) 40 MG capsule Take 40 mg by mouth daily.    . potassium chloride (K-DUR) 10 MEQ tablet Take 10 mEq by mouth 2 (two) times daily.    . rosuvastatin (CRESTOR) 10 MG tablet Take 10 mg by mouth at bedtime.    [name] amLODipine (NORVASC) 5 MG tablet TAKE ONE TABLET BY MOUTH ONCE DAILY. (Patient not taking: Reported on 01/01/2018) 30 tablet 3  . phosphorus (K PHOS NEUTRAL) 155-852-130 MG tablet Take 2 tablets (500 mg total) by mouth 3 (three) times daily. (Patient not taking: Reported on 01/01/2018) 3 tablet 0    Home: Home  Living Family/patient expects to be discharged to:: Inpatient rehab Living Arrangements: Spouse/significant other Additional Comments: Pt unreliable historian  Functional History: Prior Function Comments: Pt unreliable historian Functional Status:  Mobility: Bed Mobility Overal bed mobility: Needs Assistance Bed Mobility: Supine to Sit Supine to sit: Mod assist General bed mobility comments: When asked to sit EOB, pt initiating towards R-side, but able to come to L-side with cues; modA to assist trunk elevation, pt not actively engaging LUE, attempting to use momentum Transfers Overall transfer level: Needs assistance Equipment used: 2 person hand held assist Transfers: Sit to/from Stand Sit to Stand: Max assist, +2 physical assistance General transfer comment: Pt reliant on momentum and maxA+2  to stand; maxA to hold LUE as pt not attending to it. Self-corrected L knee instability upon standing Ambulation/Gait Ambulation/Gait assistance: Mod assist, +2 physical assistance Gait Distance (Feet): 5 Feet Assistive device: 2 person hand held assist Gait Pattern/deviations: Step-to pattern, Leaning posteriorly, Trunk flexed, Decreased dorsiflexion - left, Decreased step length - left General Gait Details: Took pivotal steps to chair, then walked steps forwards/backwards with modA+2 and HHA; initially requiring cues for sequencing with each step, then consistent cues for increasing step length with LLE Gait velocity: Decreased Gait velocity interpretation: <1.31 ft/sec, indicative of household ambulator  ADL: ADL Overall ADL's : Needs assistance/impaired Eating/Feeding: Moderate assistance Eating/Feeding Details (indicate cue type and reason): pt with nectar thick liquid recommendation and tray delivered with thins. RN called and SLP called regarding diet order. Pt provided correct nectar thick by RN. Pt provided tsp cola during session with therapist present.  Grooming: Moderate  assistance, Wash/dry face, Sitting Grooming Details (indicate cue type and reason): pt needs cues to wipe L side and attention to detail. pt with decr sensory input to L side present Upper Body Bathing: Moderate assistance Lower Body Bathing: Maximal assistance Upper Body Dressing : Moderate assistance Lower Body Dressing: Maximal assistance Toilet Transfer: +2 for physical assistance, Maximal assistance Toilet Transfer Details (indicate cue type and reason): benefits from chair in R visual field Toileting- Clothing Manipulation and Hygiene: Total assistance Functional mobility during ADLs: +2 for physical assistance, +2 for safety/equipment, Maximal assistance General ADL Comments: pt noted to have L lean preference. pt liable with mention of pending family arrival. Family asked to bring dentures that are not present to help with meals. pt with puree diet at this time.   Cognition: Cognition Overall Cognitive Status: No family/caregiver present to determine baseline cognitive functioning Arousal/Alertness: Awake/alert Orientation Level: Oriented X4 Attention: Selective Memory: Appears intact Awareness: Appears intact(for swallow and speech impairment, poor awareness to difficulty in being transferred to chair due to gross weakness) Problem Solving: Impaired(for basic, to use call bell ) Problem Solving Impairment: Functional basic Safety/Judgment: Impaired Cognition Arousal/Alertness: Awake/alert Behavior During Therapy: Flat affect Overall Cognitive Status: No family/caregiver present to determine baseline cognitive functioning Area of Impairment: Attention, Following commands, Safety/judgement, Awareness, Problem solving, Orientation Orientation Level: Disoriented to, Situation Current Attention Level: Sustained Following Commands: Follows one step commands inconsistently Safety/Judgement: Decreased awareness of safety, Decreased awareness of deficits Awareness: Emergent Problem  Solving: Slow processing, Difficulty sequencing, Requires verbal cues General Comments: L-side inattention; not consistently responding to questions asked by PT while standing on left side, able to scan past midline with verbal cues; very poor awareness of LUE. Difficulty switching attention. Some perseveration; frequently asking for coca cola. Intermittently confused regarding situation. When asked about deficits, pt says, "My talking isn't getting better.Marland KitchenMarland KitchenI don't want to talk like a retard" Pt perseverating at times on answers and then giving the correct answer. Pt requesting soda "coke" repetitively during session.    Blood pressure (!) 193/95, pulse 77, temperature 98.4 F (36.9 C), temperature source Oral, resp. rate 19, height 6\' 2"  (1.88 m), weight 127.5 kg, SpO2 98 %. Physical Exam  Nursing note and vitals reviewed. Constitutional: He is oriented to person, place, and time. He appears well-developed. No distress.  Obese  HENT:  Head: Normocephalic and atraumatic.  Eyes: Right eye exhibits no discharge. Left eye exhibits no discharge.  Left eye ptosis  Neck: Normal range of motion. Neck supple.  Cardiovascular: Normal rate and regular rhythm.  Respiratory: Effort normal and breath sounds  normal.  GI: Soft. Bowel sounds are normal.  Musculoskeletal:  Lower extremity edema  Neurological: He is alert and oriented to person, place, and time.  Alert and oriented  Left facial weakness with ptosis and dysarthria  Left visual field deficits in inferior lateral field.  Motor: Right upper extremity: 5/5 proximal distal Right lower extremity: Hip flexion, knee extension 4/5, ankle dorsiflexion 5/5 Left upper extremity: 1+/5 proximal distal Left lower extremity: Flexion, knee extension 3+/5, ankle dorsiflexion 4+/5 Sensation intact light touch  Skin: Skin is warm and dry. He is not diaphoretic.  Vascular changes bilateral lower extremities  Psychiatric: He has a normal mood and affect.  His behavior is normal. His speech is slurred.          Assessment/Plan: Diagnosis: Multiple right-sided infarcts. Labs and images (see above) independently reviewed.  Records reviewed and summated above. Stroke: Continue secondary stroke prophylaxis and Risk Factor Modification listed below:   Antiplatelet therapy:   Blood Pressure Management:  Continue current medication with prn's with permisive HTN per primary team Statin Agent:   Prediabetes management:   Left sided hemiparesis: fit for orthosis to prevent contractures (resting hand splint for day, wrist cock up splint at night, etc) Motor recovery: Fluoxetine  1. Does the need for close, 24 hr/day medical supervision in concert with the patient's rehab needs make it unreasonable for this patient to be served in a less intensive setting? Yes  2. Co-Morbidities requiring supervision/potential complications: CAD (cont meds), DJD, OSA (encourage compliance), h/o DVTs, history of CVA, post stroke dysphagia (advance as tolerated), tachycardia (monitor in accordance with pain and increasing activity), ABLA (repeat labs, transfuse to ensure appropriate perfusion for increased activity tolerance) 3. Due to safety, disease management and patient education, does the patient require 24 hr/day rehab nursing? Yes 4. Does the patient require coordinated care of a physician, rehab nurse, PT (1-2 hrs/day, 5 days/week), OT (1-2 hrs/day, 5 days/week) and SLP (1-2 hrs/day, 5 days/week) to address physical and functional deficits in the context of the above medical diagnosis(es)? Yes Addressing deficits in the following areas: balance, endurance, locomotion, strength, transferring, bowel/bladder control, bathing, dressing, toileting, speech, swallowing and psychosocial support 5. Can the patient actively participate in an intensive therapy program of at least 3 hrs of therapy per day at least 5 days per week? Yes 6. The potential for patient to make  measurable gains while on inpatient rehab is excellent 7. Anticipated functional outcomes upon discharge from inpatient rehab are min assist  with PT, min assist with OT, modified independent with SLP. 8. Estimated rehab length of stay to reach the above functional goals is: 14-18 days. 9. Anticipated D/C setting: Home 10. Anticipated post D/C treatments: HH therapy and Home excercise program 11. Overall Rehab/Functional Prognosis: good  RECOMMENDATIONS: This patient's condition is appropriate for continued rehabilitative care in the following setting: CIR after completion of medical work-up. Patient has agreed to participate in recommended program. Yes Note that insurance prior authorization may be required for reimbursement for recommended care.  Comment: Rehab Admissions Coordinator to follow up.   I have personally performed a face to face diagnostic evaluation, including, but not limited to relevant history and physical exam findings, of this patient and developed relevant assessment and plan.  Additionally, I have reviewed and concur with the physician assistant's documentation above.   Delice Lesch, MD, ABPMR Bary Leriche, PA-C 01/03/2018        Revision History  Routing History

## 2018-01-05 NOTE — Progress Notes (Signed)
Received the pt. As a new admission.Pt. And family were oriented to the unit,safety plan was explained,fall prevention plan was explained and signed.

## 2018-01-05 NOTE — Progress Notes (Signed)
Physical Therapy Treatment Patient Details Name: [name] MRN: [id number] DOB: [birth year] Today's Date: 01/05/2018    History of Present Illness Pt is a 77 y.o. male admitted 01/01/18 after being found down at home by wife, noted to have L facial droop, L-side weakness and dysarthria. 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 PMH includes HTN, CVA (2000), OSA, DJD, cataracts, CAD.    PT Comments    Patient's tolerance to treatment today was fair.  Patient was asleep with daughter present upon PT arrival.  Patient required mod assistance and verbal cueing to reach for bed railing to roll in bed.  Patient was able to move his B LE over edge of bed prior to sitting.  He required max assist and elevation of the head of the bed in order to transition to sitting.  Patient demonstrated improvement with scooting to the edge of the bed and placing his B LE on the floor.  He required hand over hand support from therapist to place L UE into walker splint as well as verbal cueing to lean forward and push into standing.  Patient continues to require increased time to regain balance after standing.  He required mod assistance and repeated verbal cueing for walker management and advancement of L LE into RW prior to stepping forward with R LE.  Patient became fatigued by end of the session requiring seated rest in recliner chair and was returned to his room.  Patient would continue to benefit from acute care PT to address his strength, balance, and mobility deficits prior to recommended CIR destination based on his current functional status.  Follow Up Recommendations  CIR;Supervision/Assistance - 24 hour     Equipment Recommendations  Rolling walker with 5" wheels    Recommendations for Other Services Rehab consult     Precautions / Restrictions Precautions Precautions: Fall Restrictions Weight Bearing  Restrictions: No    Mobility  Bed Mobility Overal bed mobility: Needs Assistance Bed Mobility: Rolling;Sidelying to Sit Rolling: Mod assist(VC to reach to bed railing for assistance) Sidelying to sit: HOB elevated;Max assist;+2 for physical assistance       General bed mobility comments: Pt remained unable to use L UE functionally but continued to bring B LE out of bed when prompted.  Patient was able to scoot to EOB and required min assist to get hips aligned.   Transfers Overall transfer level: Needs assistance Equipment used: Rolling walker (2 wheeled) Transfers: Sit to/from Stand Sit to Stand: Mod assist;+2 safety/equipment;+2 physical assistance         General transfer comment: Pt required hand over hand support to place L UE into walker splint prior to performing sit to stand.  Pt required min VC for correct placement of R hand on bed, to get his feet underneath him, and for leaning forward prior to standing.  Pt continues to improve with powering up into standing with bed elevated for assistance.  Pt requires mod assist and increased time to regain balance prior to transitioning to gait.  Ambulation/Gait Ambulation/Gait assistance: Max assist;+2 physical assistance(+3 for equipment) Gait Distance (Feet): 10 Feet Assistive device: Rolling walker (2 wheeled) Gait Pattern/deviations: Step-to pattern;Decreased stride length;Drifts right/left;Narrow base of support;Trunk flexed;Decreased weight shift to right;Decreased stance time - right;Decreased stance time - left Gait velocity: Decreased   General Gait Details: Pt required repeated verbal cueing for walker management, weight shifting to the right and advancement of  L LE.  Pt required assistance to advance L LE forward into the RW before R LE.  Patient continues to demonstrate tendency to step with R LE first dragging the L LE behind. Pt became fatigued with noted trunk forward flexion and L lateral lean into therapist and  required seated rest in recliner chair.  Pt required assistance to remove L UE from walker splint and verbal cues to reach back for armrest.   Stairs             Wheelchair Mobility    Modified Rankin (Stroke Patients Only) Modified Rankin (Stroke Patients Only) Pre-Morbid Rankin Score: No symptoms Modified Rankin: Moderately severe disability     Balance Overall balance assessment: Needs assistance Sitting-balance support: Feet supported Sitting balance-Leahy Scale: Fair Sitting balance - Comments: able to sit with close supervision   Standing balance support: Bilateral upper extremity supported Standing balance-Leahy Scale: Poor Standing balance comment: dependent on RW and L walker splint                            Cognition Arousal/Alertness: Awake/alert Behavior During Therapy: WFL for tasks assessed/performed Overall Cognitive Status: Impaired/Different from baseline Area of Impairment: Safety/judgement;Awareness;Problem solving;Following commands                 Orientation Level: Disoriented to;Time Current Attention Level: Sustained   Following Commands: Follows one step commands inconsistently;Follows one step commands with increased time Safety/Judgement: Decreased awareness of safety;Decreased awareness of deficits Awareness: Emergent Problem Solving: Slow processing;Difficulty sequencing;Requires verbal cues;Requires tactile cues;Decreased initiation General Comments: Pt with R gaze preferance, L sided neglect      Exercises      General Comments        Pertinent Vitals/Pain Pain Assessment: No/denies pain    Home Living                      Prior Function            PT Goals (current goals can now be found in the care plan section) Acute Rehab PT Goals Patient Stated Goal: none stated today PT Goal Formulation: With patient Time For Goal Achievement: 01/16/18 Potential to Achieve Goals: Fair Progress towards  PT goals: Progressing toward goals    Frequency    Min 4X/week      PT Plan Current plan remains appropriate    Co-evaluation              AM-PAC PT "6 Clicks" Daily Activity  Outcome Measure  Difficulty turning over in bed (including adjusting bedclothes, sheets and blankets)?: Unable Difficulty moving from lying on back to sitting on the side of the bed? : A Lot Difficulty sitting down on and standing up from a chair with arms (e.g., wheelchair, bedside commode, etc,.)?: A Lot Help needed moving to and from a bed to chair (including a wheelchair)?: A Lot Help needed walking in hospital room?: A Lot Help needed climbing 3-5 steps with a railing? : Total 6 Click Score: 10    End of Session Equipment Utilized During Treatment: Gait belt Activity Tolerance: Patient tolerated treatment well;Patient limited by fatigue Patient left: in chair;with call bell/phone within reach;with chair alarm set;with family/visitor present Nurse Communication: Mobility status PT Visit Diagnosis: Other abnormalities of gait and mobility (R26.89);Muscle weakness (generalized) (M62.81)     Time: 2440-1027 PT Time Calculation (min) (ACUTE ONLY): 27 min  Charges:  $Gait Training: 8-22 mins $Therapeutic  Activity: 8-22 mins                     [address], SPTA   Ripley Fraise 01/05/2018, 1:52 PM

## 2018-01-05 NOTE — PMR Pre-admission (Signed)
PMR Admission Coordinator Pre-Admission Assessment  Patient: [name] is an 77 y.o., male MRN: [id number] DOB: 10/27/1940 Height: 6' 2"  (188 cm) Weight: 127.5 kg              Insurance Information HMO: Yes    PPO:       PCP:       IPA:       80/20:       OTHER:  Group # [id number] PRIMARY: [employer] Medicare      Policy#: [id number]      Subscriber:  patient CM Name: Dorthula Nettles      Phone#: [phone]     Fax#: [phone] Pre-Cert#: [id number] with weekly updates      Employer:  Retired Benefits:  Phone #: [phone]     Name:  Online portal Eff. Date: 03/02/17     Deduct:  $0      Out of Pocket Max: $4400 (met $900.02)      Life Max: N/A CIR: $345 days 1-5      SNF: $0 days 1-20; $160 days 21-48; $0 days 49-100 Outpatient:  Medical necessity     Co-Pay: $40/visit Home Health: 100%      Co-Pay: none DME: 80%     Co-Pay: 20% Providers: in network  Medicaid Application Date:        Case Manager:   Disability Application Date:        Case Worker:    Emergency Facilities manager Information    Name Relation Home Work Mobile   [place] Spouse [phone]  [place] Daughter   [phone]     Current Medical History  Patient Admitting Diagnosis: Multiple right-sided infarcts  History of Present Illness: A 77 y.o. male with history of CAD, DJD, OSA--non compliant due to frequency,  h/o DVTs, CVA; who was admitted on 01/01/18 with acute onset of left facial weakness with dysarthria, left sided weakness and fall.  History taken from chart review, family, and patient.  CT perfusion of head done revealing small acute right frontal/MCA penumbra and CTA head/neck showed moderate cerebral artery atherosclerosis with mid stenosis R-VA origin. MRI brain showed reviewed, showing multiple right infarcts.  Per report, acute/subacute infarcts involving right frontal operculum and diffusely along right frontal and parietal lobes and remote hemorrhagic infarct in right  thalamus.  Patient with lethargy with wet voice and difficulty handling secretions. MBS done on 11/3 and he was started on dysphagia 1, nectar liquids with strict aspiration precautions.  2D echo done revealing EF 60-65% with no wall abnormalities and aortic sclerosis without stenosis. BLE dopplers were negative for DVT.  Carotid Doppler showed right ICA bifurcation large soft plaque however no significant stenosis. He was started on aspirin Plavix for embolic stroke felt to be from R-ICA soft plaque versus cardioembolic source.  TEE ordered for work-up.  Patient on aspirin Plavix for secondary stroke prevention.   Complete NIHSS TOTAL: 10  Past Medical History  Past Medical History:  Diagnosis Date  . CAD (coronary artery disease)    s/p cath in 2005 and CABG x4  . Cataract   . DJD (degenerative joint disease)   . DJD (degenerative joint disease)   . Fluid retention    mild  . H/O: gout   . History of blood clots   . HTN (hypertension)   . Hyperlipidemia   . Hyperlipidemia   . Myocardial infarction ([place]) 2009  . Obesity    with reduction  . OSA (obstructive  sleep apnea)    AHI 98/hr  . Renal insufficiency   . Sleep apnea   . Stroke [facility]) 2000    Family History  family history includes Arthritis in his brother, brother, father, and mother; Coronary artery disease in his brother, brother, father, mother, and unknown relative; Gout in his brother, brother, father, and mother; Hypertension in his brother, brother, father, and mother; Stroke in his brother, brother, father, and mother.  Prior Rehab/Hospitalizations: No previous rehab  Has the patient had major surgery during 100 days prior to admission? No  Current Medications   Current Facility-Administered Medications:  .  acetaminophen (TYLENOL) tablet 650 mg, 650 mg, Oral, Q4H PRN, 650 mg at 01/04/18 1758 **OR** acetaminophen (TYLENOL) solution 650 mg, 650 mg, Per Tube, Q4H PRN **OR** acetaminophen (TYLENOL) suppository 650 mg,  650 mg, Rectal, Q4H PRN, Dorothy Spark, MD, 650 mg at 01/01/18 1023 .  allopurinol (ZYLOPRIM) tablet 200 mg, 200 mg, Oral, Daily, Dorothy Spark, MD, 200 mg at 01/05/18 0816 .  amLODipine (NORVASC) tablet 5 mg, 5 mg, Oral, Daily, Thurnell Lose, MD, 5 mg at 01/05/18 0814 .  aspirin EC tablet 81 mg, 81 mg, Oral, Daily, Dorothy Spark, MD, 81 mg at 01/05/18 0814 .  bisacodyl (DULCOLAX) suppository 10 mg, 10 mg, Rectal, Daily, Thurnell Lose, MD, 10 mg at 01/05/18 0813 .  clopidogrel (PLAVIX) tablet 75 mg, 75 mg, Oral, Daily, Dorothy Spark, MD, 75 mg at 01/05/18 0814 .  enoxaparin (LOVENOX) injection 40 mg, 40 mg, Subcutaneous, Q24H, Dorothy Spark, MD, 40 mg at 01/05/18 0616 .  hydrALAZINE (APRESOLINE) injection 10 mg, 10 mg, Intravenous, Q4H PRN, Dorothy Spark, MD, 10 mg at 01/02/18 1551 .  metoprolol tartrate (LOPRESSOR) tablet 25 mg, 25 mg, Oral, BID, Dorothy Spark, MD, 25 mg at 01/05/18 0814 .  niacin (NIASPAN) CR tablet 500 mg, 500 mg, Oral, QHS, Dorothy Spark, MD, 500 mg at 01/03/18 2230 .  pantoprazole (PROTONIX) EC tablet 40 mg, 40 mg, Oral, Daily, Dorothy Spark, MD, 40 mg at 01/05/18 0814 .  polyethylene glycol (MIRALAX / GLYCOLAX) packet 17 g, 17 g, Oral, BID, Thurnell Lose, MD, 17 g at 01/05/18 0813 .  RESOURCE THICKENUP CLEAR, , Oral, PRN, Dorothy Spark, MD .  rosuvastatin (CRESTOR) tablet 20 mg, 20 mg, Oral, q1800, Dorothy Spark, MD, 20 mg at 01/04/18 1758 .  senna-docusate (Senokot-S) tablet 1 tablet, 1 tablet, Oral, QHS PRN, Dorothy Spark, MD .  tamsulosin [facility]) capsule 0.4 mg, 0.4 mg, Oral, Daily, Dorothy Spark, MD, 0.4 mg at 01/05/18 5397  Patients Current Diet:  Diet Order            Diet - low sodium heart healthy        DIET - DYS 1 Room service appropriate? Yes; Fluid consistency: Nectar Thick  Diet effective now              Precautions / Restrictions Precautions Precautions:  Fall Restrictions Weight Bearing Restrictions: No   Has the patient had 2 or more falls or a fall with injury in the past year?Yes. Patient reports 1 fall at the time of his CVA with neck injury  Prior Activity Level Community (5-7x/wk): Martin Majestic out daily, was driving, enjoyed Programmer, applications / [place] Devices/Equipment: None  Prior Device Use: Indicate devices/aids used by the patient prior to current illness, exacerbation or injury? None  Prior Functional Level Prior Function  Comments: Pt unreliable historian  Self Care: Did the patient need help bathing, dressing, using the toilet or eating?  Independent  Indoor Mobility: Did the patient need assistance with walking from room to room (with or without device)? Independent  Stairs: Did the patient need assistance with internal or external stairs (with or without device)? Independent  Functional Cognition: Did the patient need help planning regular tasks such as shopping or remembering to take medications? Independent  Current Functional Level Cognition  Arousal/Alertness: Awake/alert Overall Cognitive Status: Impaired/Different from baseline Current Attention Level: Sustained Orientation Level: Oriented X4 Following Commands: Follows one step commands inconsistently, Follows one step commands with increased time Safety/Judgement: Decreased awareness of safety, Decreased awareness of deficits General Comments: Pt with R gaze preferance, L sided neglect Attention: Selective Memory: Appears intact Awareness: Appears intact(for swallow and speech impairment, poor awareness to difficulty in being transferred to chair due to gross weakness) Problem Solving: Impaired(for basic, to use call bell ) Problem Solving Impairment: Functional basic Safety/Judgment: Impaired    Extremity Assessment (includes Sensation/Coordination)  Upper Extremity Assessment: LUE deficits/detail LUE Deficits / Details:  scapula elevation, scapula depression present, can adduct shoulder, triceps present, pt attempting to use R UE to move L UE when in the visual field. pt with inattention to L side of body completely without max cues. pt shows wrist / supination with associative reaction yawning. no hand movement noted at this time LUE Sensation: decreased light touch, decreased proprioception LUE Coordination: decreased fine motor, decreased gross motor  Lower Extremity Assessment: Defer to PT evaluation RLE Deficits / Details: RLE functionally at least 4/5 LLE Deficits / Details: Moving LLE automatically and to command; functionally at least 3/5; self-corrected knee instability while standing; sensation intact LLE Coordination: decreased fine motor, decreased gross motor    ADLs  Overall ADL's : Needs assistance/impaired Eating/Feeding: Moderate assistance Eating/Feeding Details (indicate cue type and reason): pt with nectar thick liquid recommendation and tray delivered with thins. RN called and SLP called regarding diet order. Pt provided correct nectar thick by RN. Pt provided tsp cola during session with therapist present.  Grooming: Moderate assistance, Wash/dry face, Sitting Grooming Details (indicate cue type and reason): pt needs cues to wipe L side and attention to detail. pt with decr sensory input to L side present Upper Body Bathing: Moderate assistance Lower Body Bathing: Maximal assistance Upper Body Dressing : Moderate assistance Lower Body Dressing: Maximal assistance Toilet Transfer: +2 for physical assistance, Maximal assistance Toilet Transfer Details (indicate cue type and reason): benefits from chair in R visual field Toileting- Clothing Manipulation and Hygiene: Total assistance Functional mobility during ADLs: +2 for physical assistance, +2 for safety/equipment, Maximal assistance General ADL Comments: pt noted to have L lean preference. pt liable with mention of pending family arrival.  Family asked to bring dentures that are not present to help with meals. pt with puree diet at this time.     Mobility  Overal bed mobility: Needs Assistance Bed Mobility: Rolling, Sidelying to Sit Rolling: Mod assist(VC to reach to bed railing for assistance) Sidelying to sit: HOB elevated, Max assist Supine to sit: Mod assist Sit to supine: Max assist General bed mobility comments: Pt remained unable to use L UE functionally but continued to bring B LE out of bed when prompted.  Patient was able to scoot to EOB and required min assist to get hips aligned.  Bed was elevated to assist with sit to stand.    Transfers  Overall transfer level: Needs assistance  Equipment used: Rolling walker (2 wheeled) Transfers: Sit to/from Stand Sit to Stand: Mod assist, +2 safety/equipment, +2 physical assistance General transfer comment: Pt required hand over hand support to place L UE into walker splint prior to performing sit to stand.  Pt required min VC for correct placement of R hand on bed, to get his feet underneath him, and for leaning forward prior to standing.  Pt continues to improve with powering up into standing with bed elevated.  Pt requires mod assist and increased time to regain balance prior to transitioning to gait.    Ambulation / Gait / Stairs / Wheelchair Mobility  Ambulation/Gait Ambulation/Gait assistance: Mod assist, +2 safety/equipment Gait Distance (Feet): 10 Feet Assistive device: Rolling walker (2 wheeled) Gait Pattern/deviations: Step-to pattern, Decreased stride length, Drifts right/left, Narrow base of support, Trunk flexed, Decreased weight shift to right, Decreased stance time - right, Decreased stance time - left General Gait Details: Pt required repeated verbal cueing for walker management, weight shifting to the right and advancement of L LE.  Pt required mod assist to advance L LE forward into the RW before R LE.  Patient continues to demonstrate tendency to step with R  LE first dragging the L LE behind. Pt became fatigued with noted trunk forward flexion and L lateral lean into therapist and required seated rest in recliner chair.  Pt required assistance to remove L UE from walker splint and verbal cues to reach back for armrest. Gait velocity: Decreased Gait velocity interpretation: <1.8 ft/sec, indicate of risk for recurrent falls    Posture / Balance Dynamic Sitting Balance Sitting balance - Comments: able to sit with close supervision Balance Overall balance assessment: Needs assistance Sitting-balance support: Feet supported Sitting balance-Leahy Scale: Fair Sitting balance - Comments: able to sit with close supervision Standing balance support: Bilateral upper extremity supported Standing balance-Leahy Scale: Poor Standing balance comment: dependent on RW and L walker splint    Special needs/care consideration BiPAP/CPAP No CPM No Continuous Drip IV No Dialysis No    Life Vest No Oxygen No Special Bed No Trach Size No Wound Vac (area) No      Skin No                              Bowel mgmt: Last BM 01/05/18 Bladder mgmt: Incontinent Diabetic mgmt No    Previous Home Environment Living Arrangements: Spouse/significant other Home Care Services: No Additional Comments: Pt unreliable historian  Discharge Living Setting Plans for Discharge Living Setting: Patient's home, House, Lives with (comment)(Lives with his wife and his son.) Type of Home at Discharge: House Discharge Home Layout: Two level, Able to live on main level with bedroom/bathroom Alternate Level Stairs-Number of Steps: 8-9 steps Discharge Home Access: Stairs to enter Entrance Stairs-Number of Steps: 3 steps down and then 4 steps up into home Does the patient have any problems obtaining your medications?: No  Social/Family/Support Systems Patient Roles: Spouse, Parent(Has a wife, 2 daughters and a son.) Contact Information: Hasan Douse - spouse Anticipated Caregiver:  wife Anticipated Caregiver's Contact Information: Marolyn Hammock - spouse - [phone] Ability/Limitations of Caregiver: Wife is retired and can assist.  Dtr can assist intermittently.  Son works. 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 Dietary Needs: Dys 1, nectar thick liquids Equipment Needs: TBD Pt/Family Agrees to Admission and willing to participate: Yes Program Orientation Provided & Reviewed with Pt/Caregiver Including Roles  & Responsibilities: Yes  Decrease burden of Care through IP rehab admission: N/A  Possible need for SNF placement upon discharge: Not planned  Patient Condition: This patient's medical and functional status has changed since the consult dated: 01/03/18 in which the Rehabilitation Physician determined and documented that the patient's condition is appropriate for intensive rehabilitative care in an inpatient rehabilitation facility. See "History of Present Illness" (above) for medical update. Functional changes are: Currently requiring mod assist for transfers and mod assist +2 to ambulate 60 feet RW. Patient's medical and functional status update has been discussed with the Rehabilitation physician and patient remains appropriate for inpatient rehabilitation. Will admit to inpatient rehab today.  Preadmission Screen Completed By:  Retta Diones, 01/05/2018 12:44 PM ______________________________________________________________________   Discussed status with Dr. Posey Pronto on 01/05/18 at 1243 and received telephone approval for admission today.  Admission Coordinator:  Retta Diones, time 1243/Date 01/05/18

## 2018-01-05 NOTE — H&P (Addendum)
Physical Medicine and Rehabilitation Admission H&P    Chief Complaint  Patient presents with  . Functional deficits due to Stroke    HPI:  [name] is a 77 year old male with history of CAD, DJD, OSA--noncompliant with CPAP due to frequency, history of DVTs, CVA; who was admitted on 01/01/2018 with acute onset of left facial weakness, dysarthria, left-sided weakness and fall.  History taken from chart review, family, and patient.  CT perfusion done revealing small acute right frontal/MCA penumbra and CTA head/neck showed moderate cerebral artery atherosclerosis with mid stenosis right-to be origin.  MRI brain reviewed, showing multiple right CVA.  Showed multiple right infarcts and remote hemorrhagic infarct right thalamic.  Patient had issues with lethargy with difficulty handling secretions and MBS done 11/3 with initiation of dysphagia 1 and nectar liquids as well as strict aspiration precautions.  2D echo done revealing EF of 60 to 65% with no wall abnormality and aortic sclerosis without stenosis.  Bilateral lower extreme Dopplers were negative for DVT.  Carotid Dopplers showed large soft plaque in the right ICA bifurcation however no significant stenosis.  He was started on aspirin and Plavix for embolic stroke felt to be from right ICA soft plaque versus cardioembolic source.  TEE done for work-up and showed no evidence of thrombus and negative bubble study.  Loop recorder placed by Dr. Lovena Le.  Review of Systems  Constitutional: Negative for chills and fever.  HENT: Negative for hearing loss and tinnitus.   Eyes: Positive for blurred vision.  Respiratory: Negative for cough and shortness of breath.   Cardiovascular: Negative for chest pain and palpitations.  Gastrointestinal: Positive for constipation (needed enema last night). Negative for heartburn and nausea.  Genitourinary: Positive for frequency and urgency. Negative for dysuria.  Musculoskeletal: Positive for neck pain (right  neck pain). Negative for back pain.  Skin: Negative for rash.  Neurological: Positive for focal weakness. Negative for dizziness and headaches.  All other systems reviewed and are negative.    Past Medical History:  Diagnosis Date  . CAD (coronary artery disease)    s/p cath in 2005 and CABG x4  . Cataract   . DJD (degenerative joint disease)   . DJD (degenerative joint disease)   . Fluid retention    mild  . H/O: gout   . History of blood clots   . HTN (hypertension)   . Hyperlipidemia   . Hyperlipidemia   . Myocardial infarction ([place]) 2009  . Obesity    with reduction  . OSA (obstructive sleep apnea)    AHI 98/hr  . Renal insufficiency   . Sleep apnea   . Stroke [facility]) 2000    Past Surgical History:  Procedure Laterality Date  . CORONARY ARTERY BYPASS GRAFT  2005   LIMA to LAD, SVG to OM1 and OM 2, RCA.   [name] left inguinal hernia repair    . LOOP RECORDER INSERTION N/A 01/03/2018   Procedure: LOOP RECORDER INSERTION;  Surgeon: Evans Lance, MD;  Location: [facility];  Service: Cardiovascular;  Laterality: N/A;  . TEE WITHOUT CARDIOVERSION N/A 12/11/2016   Procedure: TRANSESOPHAGEAL ECHOCARDIOGRAM (TEE);  Surgeon: [name], MD;  Location: [facility] ENDOSCOPY;  Service: Cardiovascular;  Laterality: N/A;  . TEE WITHOUT CARDIOVERSION N/A 01/03/2018   Procedure: TRANSESOPHAGEAL ECHOCARDIOGRAM (TEE);  Surgeon: Dorothy Spark, MD;  Location: [facility] ENDOSCOPY;  Service: Cardiovascular;  Laterality: N/A;    Family History  Problem Relation Age of Onset  . Gout Mother   .  Stroke Mother   . Coronary artery disease Mother   . Hypertension Mother   . Arthritis Mother   . Coronary artery disease Father   . Arthritis Father   . Gout Father   . Stroke Father   . Hypertension Father   . Coronary artery disease Brother   . Arthritis Brother   . Gout Brother   . Stroke Brother   . Hypertension Brother   . Coronary artery disease Brother   . Arthritis Brother   .  Gout Brother   . Stroke Brother   . Hypertension Brother   . Coronary artery disease Unknown        significant in multiple family members    Social History:   Married. Sedentary and slept a lot during the day. He reports that he has quit smoking 2005. His smoking use included cigars. He has never used smokeless tobacco. He reports that he does not drink alcohol or use drugs.   Allergies  Allergen Reactions  . Ramipril Cough    Medications Prior to Admission  Medication Sig Dispense Refill  . allopurinol (ZYLOPRIM) 100 MG tablet Take 200 mg by mouth daily.     . furosemide (LASIX) 20 MG tablet Take 20 mg by mouth 2 (two) times daily.      [name] lovastatin (MEVACOR) 20 MG tablet TAKE ONE TABLET BY MOUTH TWICE DAILY. MUST HAVE OFFICE VISIT BEFORE FURTHER REFILL. (Patient taking differently: Take 20 mg by mouth 2 (two) times daily. ) 60 tablet 6  . metoprolol tartrate (LOPRESSOR) 25 MG tablet TAKE ONE TABLET BY MOUTH TWICE DAILY (Patient taking differently: Take 25 mg by mouth 2 (two) times daily. ) 30 tablet 0  . montelukast (SINGULAIR) 10 MG tablet Take 10 mg by mouth at bedtime.    . niacin (NIASPAN) 500 MG CR tablet Take 500 mg by mouth at bedtime.      [name] omeprazole (PRILOSEC) 40 MG capsule Take 40 mg by mouth daily.    . potassium chloride (K-DUR) 10 MEQ tablet Take 10 mEq by mouth 2 (two) times daily.    . rosuvastatin (CRESTOR) 10 MG tablet Take 10 mg by mouth at bedtime.    . [DISCONTINUED] aspirin EC 81 MG tablet Take 81 mg by mouth daily.    [name] amLODipine (NORVASC) 5 MG tablet TAKE ONE TABLET BY MOUTH ONCE DAILY. (Patient not taking: Reported on 01/01/2018) 30 tablet 3  . phosphorus (K PHOS NEUTRAL) [id number] MG tablet Take 2 tablets (500 mg total) by mouth 3 (three) times daily. (Patient not taking: Reported on 01/01/2018) 3 tablet 0    Drug Regimen Review  Drug regimen was reviewed and remains appropriate with no significant issues identified  Home: Home Living Family/patient  expects to be discharged to:: Inpatient rehab Living Arrangements: Spouse/significant other Additional Comments: Pt unreliable historian   Functional History: Prior Function Comments: Pt unreliable historian  Functional Status:  Mobility: Bed Mobility Overal bed mobility: Needs Assistance Bed Mobility: Rolling, Sidelying to Sit Rolling: Mod assist(VC to reach to bed railing for assistance) Sidelying to sit: HOB elevated, Max assist Supine to sit: Mod assist Sit to supine: Max assist General bed mobility comments: Pt remained unable to use L UE functionally but continued to bring B LE out of bed when prompted.  Patient was able to scoot to EOB and required min assist to get hips aligned.  Bed was elevated to assist with sit to stand. Transfers Overall transfer level: Needs assistance Equipment used: Rolling  walker (2 wheeled) Transfers: Sit to/from Stand Sit to Stand: Mod assist, +2 safety/equipment, +2 physical assistance General transfer comment: Pt required hand over hand support to place L UE into walker splint prior to performing sit to stand.  Pt required min VC for correct placement of R hand on bed, to get his feet underneath him, and for leaning forward prior to standing.  Pt continues to improve with powering up into standing with bed elevated.  Pt requires mod assist and increased time to regain balance prior to transitioning to gait. Ambulation/Gait Ambulation/Gait assistance: Mod assist, +2 safety/equipment Gait Distance (Feet): 10 Feet Assistive device: Rolling walker (2 wheeled) Gait Pattern/deviations: Step-to pattern, Decreased stride length, Drifts right/left, Narrow base of support, Trunk flexed, Decreased weight shift to right, Decreased stance time - right, Decreased stance time - left General Gait Details: Pt required repeated verbal cueing for walker management, weight shifting to the right and advancement of L LE.  Pt required mod assist to advance L LE forward  into the RW before R LE.  Patient continues to demonstrate tendency to step with R LE first dragging the L LE behind. Pt became fatigued with noted trunk forward flexion and L lateral lean into therapist and required seated rest in recliner chair.  Pt required assistance to remove L UE from walker splint and verbal cues to reach back for armrest. Gait velocity: Decreased Gait velocity interpretation: <1.8 ft/sec, indicate of risk for recurrent falls    ADL: ADL Overall ADL's : Needs assistance/impaired Eating/Feeding: Moderate assistance Eating/Feeding Details (indicate cue type and reason): pt with nectar thick liquid recommendation and tray delivered with thins. RN called and SLP called regarding diet order. Pt provided correct nectar thick by RN. Pt provided tsp cola during session with therapist present.  Grooming: Moderate assistance, Wash/dry face, Sitting Grooming Details (indicate cue type and reason): pt needs cues to wipe L side and attention to detail. pt with decr sensory input to L side present Upper Body Bathing: Moderate assistance Lower Body Bathing: Maximal assistance Upper Body Dressing : Moderate assistance Lower Body Dressing: Maximal assistance Toilet Transfer: +2 for physical assistance, Maximal assistance Toilet Transfer Details (indicate cue type and reason): benefits from chair in R visual field Toileting- Clothing Manipulation and Hygiene: Total assistance Functional mobility during ADLs: +2 for physical assistance, +2 for safety/equipment, Maximal assistance General ADL Comments: pt noted to have L lean preference. pt liable with mention of pending family arrival. Family asked to bring dentures that are not present to help with meals. pt with puree diet at this time.   Cognition: Cognition Overall Cognitive Status: Impaired/Different from baseline Arousal/Alertness: Awake/alert Orientation Level: Oriented X4 Attention: Selective Memory: Appears  intact Awareness: Appears intact(for swallow and speech impairment, poor awareness to difficulty in being transferred to chair due to gross weakness) Problem Solving: Impaired(for basic, to use call bell ) Problem Solving Impairment: Functional basic Safety/Judgment: Impaired Cognition Arousal/Alertness: Awake/alert Behavior During Therapy: WFL for tasks assessed/performed Overall Cognitive Status: Impaired/Different from baseline Area of Impairment: Safety/judgement, Awareness, Problem solving, Following commands Orientation Level: Disoriented to, Time Current Attention Level: Sustained Following Commands: Follows one step commands inconsistently, Follows one step commands with increased time Safety/Judgement: Decreased awareness of safety, Decreased awareness of deficits Awareness: Emergent Problem Solving: Slow processing, Difficulty sequencing, Requires verbal cues, Requires tactile cues, Decreased initiation General Comments: Pt with R gaze preferance, L sided neglect  Physical Exam: Blood pressure 120/76, pulse 79, temperature 98.3 F (36.8 C), temperature source Oral, resp.  rate 20, height 6\' 2"  (1.88 m), weight 127.5 kg, SpO2 97 %. Physical Exam  Nursing note and vitals reviewed. Constitutional: He is oriented to person, place, and time. He appears well-developed.  Obese  HENT:  Head: Atraumatic.  Mild left facial edema  Eyes: EOM are normal. Right eye exhibits no discharge. Left eye exhibits no discharge.  Left ptosis  Neck: Normal range of motion. Neck supple.  Cardiovascular: Normal rate and regular rhythm.  Respiratory: Effort normal and breath sounds normal.  GI: Soft. Bowel sounds are normal.  Musculoskeletal:  Lower extremity edema  Neurological: He is alert and oriented to person, place, and time.  Left facial weakness with dysarthria.  Left inattention with left visual field deficits.  He was able to follow simple one step commands but has poor awareness of  deficits.  RUE: 5/5 proximal distal RLE: 4+/5 proximal to distal LUE: 0/5 proximal distal LLE: 4-4+/5 proximal distal  Skin:  Bilateral shins with stasis changes and wrinkling.   Psychiatric: He has a normal mood and affect. His behavior is normal.    Results for orders placed or performed during the hospital encounter of 01/01/18 (from the past 48 hour(s))  CBC     Status: Abnormal   Collection Time: 01/04/18  3:50 AM  Result Value Ref Range   WBC 10.5 4.0 - 10.5 K/uL   RBC 4.65 4.22 - 5.81 MIL/uL   Hemoglobin 12.5 (L) 13.0 - 17.0 g/dL   HCT 39.8 39.0 - 52.0 %   MCV 85.6 80.0 - 100.0 fL   MCH 26.9 26.0 - 34.0 pg   MCHC 31.4 30.0 - 36.0 g/dL   RDW 16.3 (H) 11.5 - 15.5 %   Platelets 243 150 - 400 K/uL   nRBC 0.0 0.0 - 0.2 %    Comment: Performed at [facility], [place] [address] [place], [place] [address]  CBC     Status: Abnormal   Collection Time: 01/05/18  3:56 AM  Result Value Ref Range   WBC 11.4 (H) 4.0 - 10.5 K/uL   RBC 4.60 4.22 - 5.81 MIL/uL   Hemoglobin 12.1 (L) 13.0 - 17.0 g/dL   HCT 38.8 (L) 39.0 - 52.0 %   MCV 84.3 80.0 - 100.0 fL   MCH 26.3 26.0 - 34.0 pg   MCHC 31.2 30.0 - 36.0 g/dL   RDW 16.2 (H) 11.5 - 15.5 %   Platelets 239 150 - 400 K/uL   nRBC 0.0 0.0 - 0.2 %    Comment: Performed at [facility], [place] [address] [place], [place] [address]   Ct Cervical Spine Wo Contrast  Result Date: 01/04/2018 CLINICAL DATA:  Fall with neck trauma. Assess for cervical fracture. EXAM: CT CERVICAL SPINE WITHOUT CONTRAST TECHNIQUE: Multidetector CT imaging of the cervical spine was performed without intravenous contrast. Multiplanar CT image reconstructions were also generated. COMPARISON:  01/01/2018. FINDINGS: Alignment: No traumatic malalignment.  No degenerative subluxation. Skull base and vertebrae: No fracture or primary bone lesion. Soft tissues and spinal canal: No evidence of soft tissue injury. Disc levels: Foramen magnum is widely patent. There is  arthritis at the C1-2 articulation which could be osteoarthritis or CPPD. No compression of the neural structures. C2-3: Disc bulge.  No compressive canal or foraminal narrowing. C3-4: Disc bulge with annular calcification. Mild narrowing of the left side of the canal. Foramina appear sufficiently patent. C4-5: Spondylosis with endplate osteophytes. Mild canal narrowing. Bilateral foraminal narrowing right more than left. C5-6: Spondylosis  with endplate osteophytes. Moderate canal stenosis. Mild bilateral foraminal narrowing. Bilateral ligamentous calcification. C6-7: Endplate osteophytes with moderate to marked canal stenosis. Bilateral foraminal stenosis. Bilateral ligamentous calcification. C7-T1: Facet osteoarthritis. Small endplate osteophytes. No significant canal stenosis. Mild foraminal narrowing. Upper chest: Negative Other: None IMPRESSION: No acute or traumatic finding. Chronic degenerative changes as outlined above. Spinal stenosis which could be significant at C5-6 and C6-7. Foraminal narrowing as noted above. Electronically Signed   By: Nelson Chimes M.D.   On: 01/04/2018 16:44       Medical Problem List and Plan: 1.  Deficits with mobility, transfers, self-care, speech, swallowing secondary to multiple right infarcts and remote hemorrhagic infarct right thalamic.   2.  DVT Prophylaxis/Anticoagulation: Pharmaceutical: Lovenox 3. Neck/shoulder pain/Pain Management: Muscle strain due to left inattention. Will add K pad in addition to Sportscreme for local measures.  4. Mood: LCSW to follow for evaluation and support.  5. Neuropsych: This patient is not fully capable of making decisions on his own behalf. 6. Skin/Wound Care: Routine pressure relief measures.  7. Fluids/Electrolytes/Nutrition: Monitor I/O. Check lytes in am. Nocturnal hydration.  8.  Essential hypertension: continue Norvasc and lopressor--avoid hypotension. Permissive hypertension to allow for perfusion. 9.  CAD: Continue  aspirin, lopressor and statin.  Resume beta-blocker as blood pressures stabilized. 10.  History of gout: On allopurinol 11.  Acute on chronic renal failure: Baseline serum creatinine at 1.4?  improved with hydration.  Will add IV fluids at nights due to dysphasia diet. 12.  Anemia of chronic disease: Monitor for now 13. H/o peripheral edema: has 2-3+ edema at baseline. Will add TEDs.    Post Admission Physician Evaluation: 1. Preadmission assessment reviewed and changes made below. 2. Functional deficits secondary  to multiple right infarcts and remote hemorrhagic infarct right thalamic.   3. Patient is admitted to receive collaborative, interdisciplinary care between the physiatrist, rehab nursing staff, and therapy team. 4. Patient's level of medical complexity and substantial therapy needs in context of that medical necessity cannot be provided at a lesser intensity of care such as a SNF. 5. Patient has experienced substantial functional loss from his/her baseline which was documented above under the "Functional History" and "Functional Status" headings.  Judging by the patient's diagnosis, physical exam, and functional history, the patient has potential for functional progress which will result in measurable gains while on inpatient rehab.  These gains will be of substantial and practical use upon discharge  in facilitating mobility and self-care at the household level. 61. Physiatrist will provide 24 hour management of medical needs as well as oversight of the therapy plan/treatment and provide guidance as appropriate regarding the interaction of the two. 7. 24 hour rehab nursing will assist with bladder management, safety, disease management, medication administration and patient education  and help integrate therapy concepts, techniques,education, etc. 8. PT will assess and treat for/with: Lower extremity strength, range of motion, stamina, balance, functional mobility, safety, adaptive  techniques and equipment, coping skills, pain control, education. Goals are: Min A. 9. OT will assess and treat for/with: ADL's, functional mobility, safety, upper extremity strength, adaptive techniques and equipment, ego support, and community reintegration.   Goals are: Min A. Therapy may proceed with showering this patient. 10. SLP will assess and treat for/with: Speech, swallowing.  Goals are: Mod I/Supervision. 11. Case Management and Social Worker will assess and treat for psychological issues and discharge planning. 12. Team conference will be held weekly to assess progress toward goals and to determine barriers to discharge.  13. Patient will receive at least 3 hours of therapy per day at least 5 days per week. 14. ELOS: 15-19 days.       15. Prognosis:  good  I have personally performed a face to face diagnostic evaluation, including, but not limited to relevant history and physical exam findings, of this patient and developed relevant assessment and plan.  Additionally, I have reviewed and concur with the physician assistant's documentation above.  The patient's status has not changed. The original post admission physician evaluation remains appropriate, and any changes from the pre-admission screening or documentation from the acute chart are noted above.    Delice Lesch, MD, ABPMR Bary Leriche, PA-C 01/05/2018

## 2018-01-05 NOTE — Progress Notes (Signed)
Retta Diones, RN  Rehab Admission Coordinator  Physical Medicine and Rehabilitation  PMR Pre-admission  Signed  Date of Service:  01/05/2018 12:25 PM       Related encounter: ED to Hosp-Admission (Discharged) from 01/01/2018 in [facility]      Signed         Show:Clear all [x] Manual[x] Template[x] Copied  Added by: [x] Karl Bales Evalee Mutton, RN  [] Hover for details PMR Admission Coordinator Pre-Admission Assessment  Patient: [name] is an 77 y.o., male MRN: [id number] DOB: 04-09-1940 Height: 6' 2"  (188 cm) Weight: 127.5 kg                                                                                                                                                  Insurance Information HMO: Yes    PPO:       PCP:       IPA:       80/20:       OTHER:  Group # [id number] PRIMARY: U[employer] Medicare      Policy#: [id number]      Subscriber:  patient CM Name: Dorthula Nettles      Phone#: [phone]     Fax#: [phone] Pre-Cert#: [id number] with weekly updates      Employer:  Retired Benefits:  Phone #: [phone]     Name:  Online portal Eff. Date: 03/02/17     Deduct:  $0      Out of Pocket Max: $4400 (met $900.02)      Life Max: N/A CIR: $345 days 1-5      SNF: $0 days 1-20; $160 days 21-48; $0 days 49-100 Outpatient:  Medical necessity     Co-Pay: $40/visit Home Health: 100%      Co-Pay: none DME: 80%     Co-Pay: 20% Providers: in network  Medicaid Application Date:        Case Manager:   Disability Application Date:        Case Worker:    Emergency Publishing copy Information    Name Relation Home Work Mobile   [place] Spouse [phone]  [place] Daughter   [phone]     Current Medical History  Patient Admitting Diagnosis: Multiple right-sided infarcts  History of Present Illness: A 77 y.o.malewith history of CAD, DJD, OSA--non compliant due to frequency,h/o DVTs, CVA; who was  admitted on 01/01/18 with acute onset of left facial weakness with dysarthria, left sided weakness and fall. History taken from chart review, family, and patient. CT perfusion of head done revealing small acute right frontal/MCA penumbra and CTA head/neck showed moderate cerebral artery atherosclerosis with mid stenosis R-VA origin. MRI brain showedreviewed, showing multiple right infarcts. Per report, acute/subacute infarcts involving right frontal operculum and diffusely along right frontal and parietal lobes  and remote hemorrhagic infarct in right thalamus. Patient with lethargy with wet voice and difficulty handling secretions. MBS done on 11/3 and he was started on dysphagia 1, nectar liquids with strict aspiration precautions. 2D echo done revealing EF 60-65% with no wall abnormalities and aortic sclerosis without stenosis. BLE dopplers were negative for DVT. Carotid Doppler showed right ICA bifurcation large soft plaque however no significant stenosis. He wasstarted on aspirin Plavix for embolic stroke felt to befromR-ICAsoft plaque versus cardioembolic source. TEE ordered for work-up. Patient on aspirin Plavix for secondary stroke prevention.   Complete NIHSS TOTAL: 10  Past Medical History      Past Medical History:  Diagnosis Date  . CAD (coronary artery disease)    s/p cath in 2005 and CABG x4  . Cataract   . DJD (degenerative joint disease)   . DJD (degenerative joint disease)   . Fluid retention    mild  . H/O: gout   . History of blood clots   . HTN (hypertension)   . Hyperlipidemia   . Hyperlipidemia   . Myocardial infarction ([place]) 2009  . Obesity    with reduction  . OSA (obstructive sleep apnea)    AHI 98/hr  . Renal insufficiency   . Sleep apnea   . Stroke [facility]) 2000    Family History  family history includes Arthritis in his brother, brother, father, and mother; Coronary artery disease in his brother, brother, father, mother, and  unknown relative; Gout in his brother, brother, father, and mother; Hypertension in his brother, brother, father, and mother; Stroke in his brother, brother, father, and mother.  Prior Rehab/Hospitalizations: No previous rehab  Has the patient had major surgery during 100 days prior to admission? No  Current Medications   Current Facility-Administered Medications:  .  acetaminophen (TYLENOL) tablet 650 mg, 650 mg, Oral, Q4H PRN, 650 mg at 01/04/18 1758 **OR** acetaminophen (TYLENOL) solution 650 mg, 650 mg, Per Tube, Q4H PRN **OR** acetaminophen (TYLENOL) suppository 650 mg, 650 mg, Rectal, Q4H PRN, Dorothy Spark, MD, 650 mg at 01/01/18 1023 .  allopurinol (ZYLOPRIM) tablet 200 mg, 200 mg, Oral, Daily, Dorothy Spark, MD, 200 mg at 01/05/18 0816 .  amLODipine (NORVASC) tablet 5 mg, 5 mg, Oral, Daily, Thurnell Lose, MD, 5 mg at 01/05/18 0814 .  aspirin EC tablet 81 mg, 81 mg, Oral, Daily, Dorothy Spark, MD, 81 mg at 01/05/18 0814 .  bisacodyl (DULCOLAX) suppository 10 mg, 10 mg, Rectal, Daily, Thurnell Lose, MD, 10 mg at 01/05/18 0813 .  clopidogrel (PLAVIX) tablet 75 mg, 75 mg, Oral, Daily, Dorothy Spark, MD, 75 mg at 01/05/18 0814 .  enoxaparin (LOVENOX) injection 40 mg, 40 mg, Subcutaneous, Q24H, Dorothy Spark, MD, 40 mg at 01/05/18 0616 .  hydrALAZINE (APRESOLINE) injection 10 mg, 10 mg, Intravenous, Q4H PRN, Dorothy Spark, MD, 10 mg at 01/02/18 1551 .  metoprolol tartrate (LOPRESSOR) tablet 25 mg, 25 mg, Oral, BID, Dorothy Spark, MD, 25 mg at 01/05/18 0814 .  niacin (NIASPAN) CR tablet 500 mg, 500 mg, Oral, QHS, Dorothy Spark, MD, 500 mg at 01/03/18 2230 .  pantoprazole (PROTONIX) EC tablet 40 mg, 40 mg, Oral, Daily, Dorothy Spark, MD, 40 mg at 01/05/18 0814 .  polyethylene glycol (MIRALAX / GLYCOLAX) packet 17 g, 17 g, Oral, BID, Thurnell Lose, MD, 17 g at 01/05/18 0813 .  RESOURCE THICKENUP CLEAR, , Oral, PRN, Dorothy Spark,  MD .  rosuvastatin (CRESTOR) tablet  20 mg, 20 mg, Oral, q1800, Dorothy Spark, MD, 20 mg at 01/04/18 1758 .  senna-docusate (Senokot-S) tablet 1 tablet, 1 tablet, Oral, QHS PRN, Dorothy Spark, MD .  tamsulosin [facility]) capsule 0.4 mg, 0.4 mg, Oral, Daily, Dorothy Spark, MD, 0.4 mg at 01/05/18 0630  Patients Current Diet:     Diet Order                  Diet - low sodium heart healthy         DIET - DYS 1 Room service appropriate? Yes; Fluid consistency: Nectar Thick  Diet effective now               Precautions / Restrictions Precautions Precautions: Fall Restrictions Weight Bearing Restrictions: No   Has the patient had 2 or more falls or a fall with injury in the past year?Yes. Patient reports 1 fall at the time of his CVA with neck injury  Prior Activity Level Community (5-7x/wk): Martin Majestic out daily, was driving, enjoyed Programmer, applications / [place] Devices/Equipment: None  Prior Device Use: Indicate devices/aids used by the patient prior to current illness, exacerbation or injury? None  Prior Functional Level Prior Function Comments: Pt unreliable historian  Self Care: Did the patient need help bathing, dressing, using the toilet or eating?  Independent  Indoor Mobility: Did the patient need assistance with walking from room to room (with or without device)? Independent  Stairs: Did the patient need assistance with internal or external stairs (with or without device)? Independent  Functional Cognition: Did the patient need help planning regular tasks such as shopping or remembering to take medications? Independent  Current Functional Level Cognition  Arousal/Alertness: Awake/alert Overall Cognitive Status: Impaired/Different from baseline Current Attention Level: Sustained Orientation Level: Oriented X4 Following Commands: Follows one step commands inconsistently, Follows one step commands with  increased time Safety/Judgement: Decreased awareness of safety, Decreased awareness of deficits General Comments: Pt with R gaze preferance, L sided neglect Attention: Selective Memory: Appears intact Awareness: Appears intact(for swallow and speech impairment, poor awareness to difficulty in being transferred to chair due to gross weakness) Problem Solving: Impaired(for basic, to use call bell ) Problem Solving Impairment: Functional basic Safety/Judgment: Impaired    Extremity Assessment (includes Sensation/Coordination)  Upper Extremity Assessment: LUE deficits/detail LUE Deficits / Details: scapula elevation, scapula depression present, can adduct shoulder, triceps present, pt attempting to use R UE to move L UE when in the visual field. pt with inattention to L side of body completely without max cues. pt shows wrist / supination with associative reaction yawning. no hand movement noted at this time LUE Sensation: decreased light touch, decreased proprioception LUE Coordination: decreased fine motor, decreased gross motor  Lower Extremity Assessment: Defer to PT evaluation RLE Deficits / Details: RLE functionally at least 4/5 LLE Deficits / Details: Moving LLE automatically and to command; functionally at least 3/5; self-corrected knee instability while standing; sensation intact LLE Coordination: decreased fine motor, decreased gross motor    ADLs  Overall ADL's : Needs assistance/impaired Eating/Feeding: Moderate assistance Eating/Feeding Details (indicate cue type and reason): pt with nectar thick liquid recommendation and tray delivered with thins. RN called and SLP called regarding diet order. Pt provided correct nectar thick by RN. Pt provided tsp cola during session with therapist present.  Grooming: Moderate assistance, Wash/dry face, Sitting Grooming Details (indicate cue type and reason): pt needs cues to wipe L side and attention to detail. pt with decr  sensory input to L  side present Upper Body Bathing: Moderate assistance Lower Body Bathing: Maximal assistance Upper Body Dressing : Moderate assistance Lower Body Dressing: Maximal assistance Toilet Transfer: +2 for physical assistance, Maximal assistance Toilet Transfer Details (indicate cue type and reason): benefits from chair in R visual field Toileting- Clothing Manipulation and Hygiene: Total assistance Functional mobility during ADLs: +2 for physical assistance, +2 for safety/equipment, Maximal assistance General ADL Comments: pt noted to have L lean preference. pt liable with mention of pending family arrival. Family asked to bring dentures that are not present to help with meals. pt with puree diet at this time.     Mobility  Overal bed mobility: Needs Assistance Bed Mobility: Rolling, Sidelying to Sit Rolling: Mod assist(VC to reach to bed railing for assistance) Sidelying to sit: HOB elevated, Max assist Supine to sit: Mod assist Sit to supine: Max assist General bed mobility comments: Pt remained unable to use L UE functionally but continued to bring B LE out of bed when prompted.  Patient was able to scoot to EOB and required min assist to get hips aligned.  Bed was elevated to assist with sit to stand.    Transfers  Overall transfer level: Needs assistance Equipment used: Rolling walker (2 wheeled) Transfers: Sit to/from Stand Sit to Stand: Mod assist, +2 safety/equipment, +2 physical assistance General transfer comment: Pt required hand over hand support to place L UE into walker splint prior to performing sit to stand.  Pt required min VC for correct placement of R hand on bed, to get his feet underneath him, and for leaning forward prior to standing.  Pt continues to improve with powering up into standing with bed elevated.  Pt requires mod assist and increased time to regain balance prior to transitioning to gait.    Ambulation / Gait / Stairs / Wheelchair Mobility   Ambulation/Gait Ambulation/Gait assistance: Mod assist, +2 safety/equipment Gait Distance (Feet): 10 Feet Assistive device: Rolling walker (2 wheeled) Gait Pattern/deviations: Step-to pattern, Decreased stride length, Drifts right/left, Narrow base of support, Trunk flexed, Decreased weight shift to right, Decreased stance time - right, Decreased stance time - left General Gait Details: Pt required repeated verbal cueing for walker management, weight shifting to the right and advancement of L LE.  Pt required mod assist to advance L LE forward into the RW before R LE.  Patient continues to demonstrate tendency to step with R LE first dragging the L LE behind. Pt became fatigued with noted trunk forward flexion and L lateral lean into therapist and required seated rest in recliner chair.  Pt required assistance to remove L UE from walker splint and verbal cues to reach back for armrest. Gait velocity: Decreased Gait velocity interpretation: <1.8 ft/sec, indicate of risk for recurrent falls    Posture / Balance Dynamic Sitting Balance Sitting balance - Comments: able to sit with close supervision Balance Overall balance assessment: Needs assistance Sitting-balance support: Feet supported Sitting balance-Leahy Scale: Fair Sitting balance - Comments: able to sit with close supervision Standing balance support: Bilateral upper extremity supported Standing balance-Leahy Scale: Poor Standing balance comment: dependent on RW and L walker splint    Special needs/care consideration BiPAP/CPAP No CPM No Continuous Drip IV No Dialysis No    Life Vest No Oxygen No Special Bed No Trach Size No Wound Vac (area) No      Skin No  Bowel mgmt: Last BM 01/05/18 Bladder mgmt: Incontinent Diabetic mgmt No    Previous Home Environment Living Arrangements: Spouse/significant other Home Care Services: No Additional Comments: Pt unreliable historian  Discharge Living  Setting Plans for Discharge Living Setting: Patient's home, House, Lives with (comment)(Lives with his wife and his son.) Type of Home at Discharge: House Discharge Home Layout: Two level, Able to live on main level with bedroom/bathroom Alternate Level Stairs-Number of Steps: 8-9 steps Discharge Home Access: Stairs to enter Entrance Stairs-Number of Steps: 3 steps down and then 4 steps up into home Does the patient have any problems obtaining your medications?: No  Social/Family/Support Systems Patient Roles: Spouse, Parent(Has a wife, 2 daughters and a son.) Contact Information: [place] - spouse Anticipated Caregiver: wife Anticipated Caregiver's Contact Information: Marolyn Hammock - spouse - [phone] Ability/Limitations of Caregiver: Wife is retired and can assist.  Dtr can assist intermittently.  Son works. 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 Dietary Needs: Dys 1, nectar thick liquids Equipment Needs: TBD Pt/Family Agrees to Admission and willing to participate: Yes Program Orientation Provided & Reviewed with Pt/Caregiver Including Roles  & Responsibilities: Yes  Decrease burden of Care through IP rehab admission: N/A  Possible need for SNF placement upon discharge: Not planned  Patient Condition: This patient's medical and functional status has changed since the consult dated: 01/03/18 in which the Rehabilitation Physician determined and documented that the patient's condition is appropriate for intensive rehabilitative care in an inpatient rehabilitation facility. See "History of Present Illness" (above) for  medical update. Functional changes are: Currently requiring mod assist for transfers and mod assist +2 to ambulate 60 feet RW. Patient's medical and functional status update has been discussed with the Rehabilitation physician and patient remains appropriate for inpatient rehabilitation. Will admit to inpatient rehab today.  Preadmission Screen Completed By:  Retta Diones, 01/05/2018 12:44 PM ______________________________________________________________________   Discussed status with Dr. Posey Pronto on 01/05/18 at 1243 and received telephone approval for admission today.  Admission Coordinator:  Retta Diones, time 1243/Date 01/05/18           Cosigned by: Jamse Arn, MD at 01/05/2018 12:50 PM  Revision History

## 2018-01-05 NOTE — Progress Notes (Signed)
IP rehab admissions - I have approval for acute inpatient rehab admission.  Bed available and will admit today.  Call me for questions.  [phone]

## 2018-01-05 NOTE — Discharge Summary (Signed)
Physician Discharge Summary   Patient ID: DANON LOGRASSO MRN: [id number] DOB/AGE: [birth year] 77 y.o.  Admit date: 01/01/2018 Discharge date: 01/05/2018  Primary Care Physician:  Rogers Blocker, MD   Recommendations for Outpatient Follow-up:  1. Follow up with PCP in 1-2 weeks  Home Health: patient being discharged to CIR   Equipment/Devices:   Discharge Condition: stable  CODE STATUS: FULL  Diet recommendation: dysphagia 1, nectar thick   Discharge Diagnoses:    . Acute CVA (cerebrovascular accident) ([place]) . CAD (coronary artery disease) . Hypertensive urgency . 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 Ramipril Cough     DISCHARGE MEDICATIONS: Allergies as of 01/05/2018      Reactions   Ramipril Cough      Medication List    STOP taking these medications   lovastatin 20 MG tablet Commonly known as:  MEVACOR     TAKE these medications   allopurinol 100 MG tablet Commonly known as:  ZYLOPRIM Take 200 mg by mouth daily.   amLODipine 5 MG tablet Commonly known as:  NORVASC TAKE ONE TABLET BY MOUTH ONCE DAILY.   aspirin EC 81 MG tablet Take 1 tablet (81 mg total) by mouth daily for 17 days.   clopidogrel 75 MG tablet Commonly known as:  PLAVIX Take 1 tablet (75 mg total) by mouth daily.   furosemide 20 MG tablet Commonly known as:  LASIX Take 20 mg by mouth 2 (two) times daily.   metoprolol tartrate 25 MG tablet Commonly known as:  LOPRESSOR TAKE ONE TABLET BY MOUTH TWICE DAILY   montelukast 10 MG tablet Commonly known as:  SINGULAIR Take 10 mg by mouth at bedtime.   niacin 500 MG CR tablet Commonly known as:  NIASPAN Take 500 mg by mouth at bedtime.   omeprazole 40 MG capsule Commonly known as:  PRILOSEC Take 40 mg by mouth daily.   phosphorus 155-852-130 MG tablet Commonly known as:  K PHOS NEUTRAL Take 2 tablets (500 mg  total) by mouth 3 (three) times daily.   potassium chloride 10 MEQ tablet Commonly known as:  K-DUR Take 10 mEq by mouth 2 (two) times daily.   rosuvastatin 20 MG tablet Commonly known as:  CRESTOR Take 1 tablet (20 mg total) by mouth daily at 6 PM. What changed:    medication strength  how much to take  when to take this   tamsulosin 0.4 MG Caps capsule Commonly known as:  FLOMAX Take 1 capsule (0.4 mg total) by mouth daily.        Brief H and P: For complete details please refer to admission H and P, but in brief Kalief T Foustis a 77 y.o.malewithhistory of CAD status post CABG, hypertension, chronic kidney disease, hyperlipidemia, sleep apnea was found on the floor by patient's wife around 2 AM this morning in the bathroom. Patient's wife woke up after she heard a sound. On exam patient was having left facial drooping dysarthria and left-sided weakness. He was diagnosed with CVA and admitted to the hospital.  Hospital Course:   Acute right MCA territory infarct -Patient presented with left-sided weakness, dysphagia, dysarthria, MRI showed acute right MCA territory infarct -CT angiogram of the head and neck unremarkable -Patient underwent 2D echo and TEE.  TEE showed EF of 60 to 65%, no thrombus  or patent foreman ovale, no cardiac source of emboli, negative bubble study 2D echo showed EF of 60 to 71%, grade 1 diastolic dysfunction Patient was seen by neurology and placed on dual antiplatelet therapy, aspirin and Plavix for 3 weeks and then Plavix alone. LDL 126, continue statin Hemoglobin A1c 5.8 PT recommended CIR, patient accepted  Acute kidney injury on CKD stage IV Creatinine close to his baseline 1.3, resolved after IV fluid hydration  Essential hypertension Continue beta-blocker and low-dose Norvasc, started inpatient  CAD No acute issues, no chest pain, continue aspirin, Plavix, statin  Dyslipidemia Continue statin, LDL 126  GERD Continue  PPI  History of gout Currently stable, resume allopurinol    Day of Discharge S: No acute issues  BP 120/76 (BP Location: Right Arm)   Pulse 79   Temp 98.3 F (36.8 C) (Oral)   Resp 20   Ht 6\' 2"  (1.88 m)   Wt 127.5 kg   SpO2 97%   BMI 36.09 kg/m   Physical Exam: General: Alert and awake oriented, not in any acute distress, left-sided nasolabial flattening. HEENT: anicteric sclera, pupils reactive to light and accommodation CVS: S1-S2 clear no murmur rubs or gallops Chest: clear to auscultation bilaterally, no wheezing rales or rhonchi Abdomen: soft nontender, nondistended, normal bowel sounds Extremities: no cyanosis, clubbing or edema noted bilaterally Neuro: left-sided hemiparesis, left arm 1/5, leg 3/5 strength.  Right-sided 5/5   The results of significant diagnostics from this hospitalization (including imaging, microbiology, ancillary and laboratory) are listed below for reference.      Procedures/Studies:  Ct Angio Head W Or Wo Contrast  Result Date: 01/01/2018 CLINICAL DATA:  Follow up code stroke. LEFT-sided weakness. History of stroke, hypertension and hyperlipidemia. EXAM: CT ANGIOGRAPHY HEAD AND NECK CT PERFUSION BRAIN TECHNIQUE: Multidetector CT imaging of the head and neck was performed using the standard protocol during bolus administration of intravenous contrast. Multiplanar CT image reconstructions and MIPs were obtained to evaluate the vascular anatomy. Carotid stenosis measurements (when applicable) are obtained utilizing NASCET criteria, using the distal internal carotid diameter as the denominator. Multiphase CT imaging of the brain was performed following IV bolus contrast injection. Subsequent parametric perfusion maps were calculated using RAPID software. CONTRAST:  152mL ISOVUE-370 IOPAMIDOL (ISOVUE-370) INJECTION 76% COMPARISON:  CT HEAD January 01, 2018 FINDINGS: CTA NECK FINDINGS: AORTIC ARCH: Normal appearance of the thoracic arch, normal branch  pattern. Mild calcific atherosclerosis aortic arch. The origins of the innominate, left Common carotid artery and subclavian artery are widely patent. RIGHT CAROTID SYSTEM: Common carotid artery is patent. Calcific atherosclerosis resulting in less than 50% stenosis by NASCET criteria. Normal appearance of the internal carotid artery. LEFT CAROTID SYSTEM: Common carotid artery is patent. Mild calcific atherosclerosis of the carotid bifurcation without hemodynamically significant stenosis by NASCET criteria. Normal appearance of the internal carotid artery. VERTEBRAL ARTERIES:Left vertebral artery is dominant. Calcific atherosclerosis resulting in mild stenosis RIGHT vertebral artery. Degenerative changes cervical spine resultant mild extrinsic compression. SKELETON: No acute osseous process though bone windows have not been submitted. Status post median sternotomy. 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 Patient is edentulous. OTHER NECK: Soft tissues of the neck are nonacute though, not tailored for evaluation. UPPER CHEST: Included lung apices are clear. Mild centrilobular emphysema. No superior mediastinal lymphadenopathy. CTA HEAD FINDINGS: ANTERIOR CIRCULATION: Patent cervical internal carotid arteries, petrous, cavernous and supra clinoid internal carotid arteries. Patent anterior communicating artery. Patent anterior and middle cerebral arteries, moderate luminal irregularity compatible with atherosclerosis.  No large vessel occlusion, significant stenosis, contrast extravasation or aneurysm. POSTERIOR CIRCULATION: Patent vertebral arteries, vertebrobasilar junction and basilar artery, as well as main branch vessels. Patent posterior cerebral arteries, moderate luminal irregularity compatible with atherosclerosis. No large vessel occlusion, significant stenosis, contrast extravasation or aneurysm. VENOUS SINUSES: Major dural venous sinuses are patent though not  tailored for evaluation on this angiographic examination. ANATOMIC VARIANTS: None. DELAYED PHASE: Not performed. MIP images reviewed. CT Brain Perfusion Findings-motion degraded examination: CBF (<30%) Volume: 55mL Perfusion (Tmax>6.0s) volume: 62mL Mismatch Volume: 30mL Infarction Location:RIGHT frontal lobe. IMPRESSION: CTA NECK: 1. No hemodynamically significant stenosis ICA's. 2. Mild stenosis RIGHT vertebral artery origin. CTA HEAD: 1. No emergent large vessel occlusion. 2. Moderate cerebral artery atherosclerosis. CT PERFUSION: 1. Small acute RIGHT frontal/MCA penumbra though limited by motion artifact. Critical Value/emergent results text paged to Dr.ERIC [facility] via AMION secure system on 01/01/2018 at 3:55 am, including interpreting physician's phone number. Aortic Atherosclerosis (ICD10-I70.0). Emphysema (ICD10-J43.9). Electronically Signed   By: Elon Alas M.D.   On: 01/01/2018 03:56   Ct Angio Neck W Or Wo Contrast  Result Date: 01/01/2018 CLINICAL DATA:  Follow up code stroke. LEFT-sided weakness. History of stroke, hypertension and hyperlipidemia. EXAM: CT ANGIOGRAPHY HEAD AND NECK CT PERFUSION BRAIN TECHNIQUE: Multidetector CT imaging of the head and neck was performed using the standard protocol during bolus administration of intravenous contrast. Multiplanar CT image reconstructions and MIPs were obtained to evaluate the vascular anatomy. Carotid stenosis measurements (when applicable) are obtained utilizing NASCET criteria, using the distal internal carotid diameter as the denominator. Multiphase CT imaging of the brain was performed following IV bolus contrast injection. Subsequent parametric perfusion maps were calculated using RAPID software. CONTRAST:  114mL ISOVUE-370 IOPAMIDOL (ISOVUE-370) INJECTION 76% COMPARISON:  CT HEAD January 01, 2018 FINDINGS: CTA NECK FINDINGS: AORTIC ARCH: Normal appearance of the thoracic arch, normal branch pattern. Mild calcific atherosclerosis aortic arch.  The origins of the innominate, left Common carotid artery and subclavian artery are widely patent. RIGHT CAROTID SYSTEM: Common carotid artery is patent. Calcific atherosclerosis resulting in less than 50% stenosis by NASCET criteria. Normal appearance of the internal carotid artery. LEFT CAROTID SYSTEM: Common carotid artery is patent. Mild calcific atherosclerosis of the carotid bifurcation without hemodynamically significant stenosis by NASCET criteria. Normal appearance of the internal carotid artery. VERTEBRAL ARTERIES:Left vertebral artery is dominant. Calcific atherosclerosis resulting in mild stenosis RIGHT vertebral artery. Degenerative changes cervical spine resultant mild extrinsic compression. SKELETON: No acute osseous process though bone windows have not been submitted. Status post median sternotomy. 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 Patient is edentulous. OTHER NECK: Soft tissues of the neck are nonacute though, not tailored for evaluation. UPPER CHEST: Included lung apices are clear. Mild centrilobular emphysema. No superior mediastinal lymphadenopathy. CTA HEAD FINDINGS: ANTERIOR CIRCULATION: Patent cervical internal carotid arteries, petrous, cavernous and supra clinoid internal carotid arteries. Patent anterior communicating artery. Patent anterior and middle cerebral arteries, moderate luminal irregularity compatible with atherosclerosis. No large vessel occlusion, significant stenosis, contrast extravasation or aneurysm. POSTERIOR CIRCULATION: Patent vertebral arteries, vertebrobasilar junction and basilar artery, as well as main branch vessels. Patent posterior cerebral arteries, moderate luminal irregularity compatible with atherosclerosis. No large vessel occlusion, significant stenosis, contrast extravasation or aneurysm. VENOUS SINUSES: Major dural venous sinuses are patent though not tailored for evaluation on this angiographic examination.  ANATOMIC VARIANTS: None. DELAYED PHASE: Not performed. MIP images reviewed. CT Brain Perfusion Findings-motion degraded examination: CBF (<30%) Volume: 65mL Perfusion (Tmax>6.0s) volume: 18mL Mismatch Volume:  69mL Infarction Location:RIGHT frontal lobe. IMPRESSION: CTA NECK: 1. No hemodynamically significant stenosis ICA's. 2. Mild stenosis RIGHT vertebral artery origin. CTA HEAD: 1. No emergent large vessel occlusion. 2. Moderate cerebral artery atherosclerosis. CT PERFUSION: 1. Small acute RIGHT frontal/MCA penumbra though limited by motion artifact. Critical Value/emergent results text paged to Dr.ERIC [facility] via AMION secure system on 01/01/2018 at 3:55 am, including interpreting physician's phone number. Aortic Atherosclerosis (ICD10-I70.0). Emphysema (ICD10-J43.9). Electronically Signed   By: Elon Alas M.D.   On: 01/01/2018 03:56   Ct Cervical Spine Wo Contrast  Result Date: 01/04/2018 CLINICAL DATA:  Fall with neck trauma. Assess for cervical fracture. EXAM: CT CERVICAL SPINE WITHOUT CONTRAST TECHNIQUE: Multidetector CT imaging of the cervical spine was performed without intravenous contrast. Multiplanar CT image reconstructions were also generated. COMPARISON:  01/01/2018. FINDINGS: Alignment: No traumatic malalignment.  No degenerative subluxation. Skull base and vertebrae: No fracture or primary bone lesion. Soft tissues and spinal canal: No evidence of soft tissue injury. Disc levels: Foramen magnum is widely patent. There is arthritis at the C1-2 articulation which could be osteoarthritis or CPPD. No compression of the neural structures. C2-3: Disc bulge.  No compressive canal or foraminal narrowing. C3-4: Disc bulge with annular calcification. Mild narrowing of the left side of the canal. Foramina appear sufficiently patent. C4-5: Spondylosis with endplate osteophytes. Mild canal narrowing. Bilateral foraminal narrowing right more than left. C5-6: Spondylosis with endplate osteophytes. Moderate  canal stenosis. Mild bilateral foraminal narrowing. Bilateral ligamentous calcification. C6-7: Endplate osteophytes with moderate to marked canal stenosis. Bilateral foraminal stenosis. Bilateral ligamentous calcification. C7-T1: Facet osteoarthritis. Small endplate osteophytes. No significant canal stenosis. Mild foraminal narrowing. Upper chest: Negative Other: None IMPRESSION: No acute or traumatic finding. Chronic degenerative changes as outlined above. Spinal stenosis which could be significant at C5-6 and C6-7. Foraminal narrowing as noted above. Electronically Signed   By: Nelson Chimes M.D.   On: 01/04/2018 16:44   Mr Brain Wo Contrast  Result Date: 01/01/2018 CLINICAL DATA:  Left-sided weakness. Focal neuro deficit of greater than 6 hours. EXAM: MRI HEAD WITHOUT CONTRAST TECHNIQUE: Multiplanar, multiecho pulse sequences of the brain and surrounding structures were obtained without intravenous contrast. COMPARISON:  CT head without contrast 01/01/2018. CTA head and neck 01/01/2018. FINDINGS: Brain: The diffusion-weighted images demonstrate a confluent nonhemorrhagic infarct in the right frontal operculum. Additional scattered areas of nonhemorrhagic infarct are present over the right frontal and parietal lobe as well as the right occipital lobe. The pattern follows a watershed distribution. T2 signal changes are associated with the areas of restricted diffusion. Additional confluent periventricular T2 signal changes are present bilaterally. Ventricles are of normal size. No significant extra-axial fluid collection present. A remote hemorrhagic lacunar infarct is present the right thalamus or posterior limb internal capsule. There is a remote hemorrhage in the anterior left frontal lobe. Vascular: Flow is present in the major intracranial arteries. Skull and upper cervical spine: The craniocervical junction is normal. Upper cervical spine is unremarkable. Marrow signal is somewhat depressed. This likely  corresponds with anemia. Sinuses/Orbits: The paranasal sinuses and mastoid air cells are clear. Globes and orbits are within normal limits. IMPRESSION: 1. Acute/subacute right MCA territory infarcts involving the right frontal operculum an and diffusely along the right frontal and parietal lobes. The area along the convexity may be in a watershed distribution. 2. Additional confluent white matter changes are present bilaterally, likely reflecting the sequela of chronic microvascular ischemia. 3. Remote lacunar infarcts of the basal ganglia including a remote hemorrhagic infarct  of the right thalamus. Electronically Signed   By: San Morelle M.D.   On: 01/01/2018 10:20   Ct C-spine No Charge  Result Date: 01/01/2018 CLINICAL DATA:  Mechanical fall. EXAM: CT CERVICAL SPINE WITHOUT CONTRAST TECHNIQUE: Reformatted CT imaging of the cervical spine was performed from CT angiogram head and neck. Multiplanar CT image reconstructions were also generated. COMPARISON:  None. FINDINGS: ALIGNMENT: Straightened lordosis.  Vertebral bodies in alignment. SKULL BASE AND VERTEBRAE: Cervical vertebral bodies and posterior elements are intact. Moderate C4-5, moderate to severe C6-7 disc height loss, mild at C5-6. Multilevel uncovertebral hypertrophy and endplate spurring. No destructive bony lesions. 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 Calcified craniocervical ligaments. C5-6 calcified ligamentum flavum. SOFT TISSUES AND SPINAL CANAL: Nonacute. Nuchal ligament calcification. DISC LEVELS: Moderate canal stenosis C5-6 and C6-7. Moderate RIGHT C4-5, RIGHT C5-6 and bilateral C6-7 neural foraminal narrowing. UPPER CHEST: Lung apices are clear. OTHER: None. IMPRESSION: 1. No fracture or malalignment. 2. Moderate canal stenosis C5-6 and C6-7. 3. Moderate C4-5 through C6-7 neural foraminal narrowing. Electronically Signed   By: Elon Alas M.D.   On: 01/01/2018 04:18   Ct Cerebral Perfusion W  Contrast  Result Date: 01/01/2018 CLINICAL DATA:  Follow up code stroke. LEFT-sided weakness. History of stroke, hypertension and hyperlipidemia. EXAM: CT ANGIOGRAPHY HEAD AND NECK CT PERFUSION BRAIN TECHNIQUE: Multidetector CT imaging of the head and neck was performed using the standard protocol during bolus administration of intravenous contrast. Multiplanar CT image reconstructions and MIPs were obtained to evaluate the vascular anatomy. Carotid stenosis measurements (when applicable) are obtained utilizing NASCET criteria, using the distal internal carotid diameter as the denominator. Multiphase CT imaging of the brain was performed following IV bolus contrast injection. Subsequent parametric perfusion maps were calculated using RAPID software. CONTRAST:  147mL ISOVUE-370 IOPAMIDOL (ISOVUE-370) INJECTION 76% COMPARISON:  CT HEAD January 01, 2018 FINDINGS: CTA NECK FINDINGS: AORTIC ARCH: Normal appearance of the thoracic arch, normal branch pattern. Mild calcific atherosclerosis aortic arch. The origins of the innominate, left Common carotid artery and subclavian artery are widely patent. RIGHT CAROTID SYSTEM: Common carotid artery is patent. Calcific atherosclerosis resulting in less than 50% stenosis by NASCET criteria. Normal appearance of the internal carotid artery. LEFT CAROTID SYSTEM: Common carotid artery is patent. Mild calcific atherosclerosis of the carotid bifurcation without hemodynamically significant stenosis by NASCET criteria. Normal appearance of the internal carotid artery. VERTEBRAL ARTERIES:Left vertebral artery is dominant. Calcific atherosclerosis resulting in mild stenosis RIGHT vertebral artery. Degenerative changes cervical spine resultant mild extrinsic compression. SKELETON: No acute osseous process though bone windows have not been submitted. Status post median sternotomy. 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 Patient is edentulous.  OTHER NECK: Soft tissues of the neck are nonacute though, not tailored for evaluation. UPPER CHEST: Included lung apices are clear. Mild centrilobular emphysema. No superior mediastinal lymphadenopathy. CTA HEAD FINDINGS: ANTERIOR CIRCULATION: Patent cervical internal carotid arteries, petrous, cavernous and supra clinoid internal carotid arteries. Patent anterior communicating artery. Patent anterior and middle cerebral arteries, moderate luminal irregularity compatible with atherosclerosis. No large vessel occlusion, significant stenosis, contrast extravasation or aneurysm. POSTERIOR CIRCULATION: Patent vertebral arteries, vertebrobasilar junction and basilar artery, as well as main branch vessels. Patent posterior cerebral arteries, moderate luminal irregularity compatible with atherosclerosis. No large vessel occlusion, significant stenosis, contrast extravasation or aneurysm. VENOUS SINUSES: Major dural venous sinuses are patent though not tailored for evaluation on this angiographic examination. ANATOMIC VARIANTS: None. DELAYED PHASE: Not performed. MIP images reviewed. CT Brain Perfusion Findings-motion degraded  examination: CBF (<30%) Volume: 90mL Perfusion (Tmax>6.0s) volume: 73mL Mismatch Volume: 30mL Infarction Location:RIGHT frontal lobe. IMPRESSION: CTA NECK: 1. No hemodynamically significant stenosis ICA's. 2. Mild stenosis RIGHT vertebral artery origin. CTA HEAD: 1. No emergent large vessel occlusion. 2. Moderate cerebral artery atherosclerosis. CT PERFUSION: 1. Small acute RIGHT frontal/MCA penumbra though limited by motion artifact. Critical Value/emergent results text paged to Dr.ERIC [facility] via AMION secure system on 01/01/2018 at 3:55 am, including interpreting physician's phone number. Aortic Atherosclerosis (ICD10-I70.0). Emphysema (ICD10-J43.9). Electronically Signed   By: Elon Alas M.D.   On: 01/01/2018 03:56   Dg Swallowing Func-speech Pathology  Result Date: 01/02/2018 Objective  Swallowing Evaluation: Type of Study: MBS-Modified Barium Swallow Study  Patient Details Name: ISAY PERLEBERG MRN: [id number] Date of Birth: 01-30-1941 Today's Date: 01/02/2018 Time: SLP Start Time (ACUTE ONLY): 9892 -SLP Stop Time (ACUTE ONLY): 0935 SLP Time Calculation (min) (ACUTE ONLY): 31 min Past Medical History: Past Medical History: Diagnosis Date . CAD (coronary artery disease)   s/p cath in 2005 and CABG x4 . Cataract  . DJD (degenerative joint disease)  . DJD (degenerative joint disease)  . Fluid retention   mild . H/O: gout  . History of blood clots  . HTN (hypertension)  . Hyperlipidemia  . Hyperlipidemia  . Myocardial infarction ([place]) 2009 . Obesity   with reduction . OSA (obstructive sleep apnea)   AHI 98/hr . Renal insufficiency  . Sleep apnea  . Stroke [facility]) 2000 Past Surgical History: Past Surgical History: Procedure Laterality Date . CORONARY ARTERY BYPASS GRAFT  2005  LIMA to LAD, SVG to OM1 and OM 2, RCA.  [name] left inguinal hernia repair   . TEE WITHOUT CARDIOVERSION N/A 12/11/2016  Procedure: TRANSESOPHAGEAL ECHOCARDIOGRAM (TEE);  Surgeon: [name], MD;  Location: [facility] ENDOSCOPY;  Service: Cardiovascular;  Laterality: N/A; HPI: pt is a 77 yo adm to [facility] after being found on the floor.  pmh + for cva, cad s/p cabg, cervical spondylosis, ckd, htn, sleep apnea.  pt with left facial droop and dysarthria as well as left sided weakness.  speech and swallow eval ordered.  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   Subjective: pt awake in chair Assessment / Plan / Recommendation CHL IP CLINICAL IMPRESSIONS 01/02/2018 Clinical Impression Pt presents with moderate oropharyngeal dysphagia with sensorimotor deficits.  Pt with decreased oral coordination resulting in premature spillage of barium into phayrnx.  Delayed swallow and decreased laryngeal closure results in significant aspiration of thin via straw with cough response.  Pt did not clear aspirates with  cough.  No aspiration or penetration with thin via tsp, nectar, puree, cracker but mild pharyngeal residuals mixed with secretions.  Pt with significantly compromised mastication of cracker *dentures at home which he uses for po* therefore recommend dys1/nectar and tsps thin water/cola with strict precautions.  Of note, pt did become sleepy during MBS and required verbal/tactile dues to remain alert - therefore adequacy of nutrition may be worrisome.  STRICT precautions needed.    Medicine with puree *crushed if large*, start meals with liquids and intermittent dry swallow.  If pt is coughing, he IS ASPIRATING.  Using teach back with video, educated pt to recommendations and reinforced with written strategies/verbal teach and feedback. SLP Visit Diagnosis Dysphagia, oropharyngeal phase (R13.12) Attention and concentration deficit following -- Frontal lobe and executive function deficit following -- Impact on safety and function Moderate aspiration risk   CHL IP TREATMENT RECOMMENDATION  01/02/2018 Treatment Recommendations Therapy as outlined in treatment plan below   Prognosis 01/01/2018 Prognosis for Safe Diet Advancement Guarded Barriers to Reach Goals -- Barriers/Prognosis Comment -- CHL IP DIET RECOMMENDATION 01/02/2018 SLP Diet Recommendations Dysphagia 1 (Puree) solids;Nectar thick liquid;Other (Comment) Liquid Administration via Cup;Straw Medication Administration Crushed with puree Compensations Slow rate;Small sips/bites Postural Changes Seated upright at 90 degrees;Remain semi-upright after after feeds/meals (Comment)   CHL IP OTHER RECOMMENDATIONS 01/02/2018 Recommended Consults -- Oral Care Recommendations Oral care QID Other Recommendations Order thickener from pharmacy;Have oral suction available   CHL IP FOLLOW UP RECOMMENDATIONS 01/02/2018 Follow up Recommendations Inpatient Rehab;Skilled Nursing facility   [facility] IP FREQUENCY AND DURATION 01/02/2018 Speech Therapy Frequency (ACUTE ONLY) min 1 x/week  Treatment Duration 1 week      CHL IP ORAL PHASE 01/02/2018 Oral Phase Impaired Oral - Pudding Teaspoon -- Oral - Pudding Cup -- Oral - Honey Teaspoon -- Oral - Honey Cup -- Oral - Nectar Teaspoon Delayed oral transit;Premature spillage;Reduced posterior propulsion Oral - Nectar Cup Delayed oral transit;Premature spillage Oral - Nectar Straw Decreased bolus cohesion;Premature spillage;Reduced posterior propulsion Oral - Thin Teaspoon Decreased bolus cohesion;Premature spillage;Reduced posterior propulsion Oral - Thin Cup Decreased bolus cohesion;Premature spillage;Reduced posterior propulsion Oral - Thin Straw Decreased bolus cohesion;Premature spillage;Reduced posterior propulsion Oral - Puree Decreased bolus cohesion;Premature spillage;Reduced posterior propulsion Oral - Mech Soft Premature spillage;Impaired mastication;Reduced posterior propulsion Oral - Regular -- Oral - Multi-Consistency -- Oral - Pill -- Oral Phase - Comment --  CHL IP PHARYNGEAL PHASE 01/02/2018 Pharyngeal Phase Impaired Pharyngeal- Pudding Teaspoon -- Pharyngeal -- Pharyngeal- Pudding Cup -- Pharyngeal -- Pharyngeal- Honey Teaspoon -- Pharyngeal -- Pharyngeal- Honey Cup -- Pharyngeal -- Pharyngeal- Nectar Teaspoon Reduced tongue base retraction;Reduced airway/laryngeal closure;Reduced laryngeal elevation Pharyngeal -- Pharyngeal- Nectar Cup Reduced tongue base retraction;Reduced laryngeal elevation;Reduced airway/laryngeal closure Pharyngeal -- Pharyngeal- Nectar Straw Reduced tongue base retraction;Reduced laryngeal elevation;Reduced airway/laryngeal closure;Reduced pharyngeal peristalsis;Reduced epiglottic inversion Pharyngeal -- Pharyngeal- Thin Teaspoon Reduced tongue base retraction;Delayed swallow initiation-vallecula;Reduced airway/laryngeal closure;Reduced laryngeal elevation;Reduced pharyngeal peristalsis;Reduced epiglottic inversion Pharyngeal -- Pharyngeal- Thin Cup Reduced tongue base retraction;Pharyngeal residue -  valleculae;Delayed swallow initiation-pyriform sinuses;Reduced airway/laryngeal closure;Reduced laryngeal elevation;Reduced pharyngeal peristalsis;Reduced epiglottic inversion Pharyngeal -- Pharyngeal- Thin Straw Reduced laryngeal elevation;Reduced airway/laryngeal closure;Reduced tongue base retraction;Penetration/Aspiration during swallow;Moderate aspiration;Delayed swallow initiation-pyriform sinuses;Reduced pharyngeal peristalsis;Reduced epiglottic inversion Pharyngeal Material enters airway, passes BELOW cords and not ejected out despite cough attempt by patient Pharyngeal- Puree Reduced tongue base retraction;Pharyngeal residue - valleculae;Reduced laryngeal elevation;Reduced airway/laryngeal closure;Reduced epiglottic inversion;Reduced pharyngeal peristalsis Pharyngeal -- Pharyngeal- Mechanical Soft Reduced tongue base retraction;Pharyngeal residue - valleculae Pharyngeal -- Pharyngeal- Regular -- Pharyngeal -- Pharyngeal- Multi-consistency -- Pharyngeal -- Pharyngeal- Pill -- Pharyngeal -- Pharyngeal Comment --  No flowsheet data found. Macario Golds 01/02/2018, 2:40 PM  Luanna Salk, MS [facility] SLP Acute Rehab Services Pager [phone] Office [phone]             Ct Head Code Stroke Wo Contrast  Result Date: 01/01/2018 CLINICAL DATA:  Code stroke. LEFT-sided weakness. History of stroke, hypertension and hyperlipidemia. EXAM: CT HEAD WITHOUT CONTRAST TECHNIQUE: Contiguous axial images were obtained from the base of the skull through the vertex without intravenous contrast. COMPARISON:  MRI head March 17, 2013 FINDINGS: BRAIN: No intraparenchymal hemorrhage, mass effect nor midline shift. Old RIGHT cerebellar infarcts. Confluent pontine and supratentorial white matter hypodensities. Old RIGHT basal ganglia infarcts with ex vacuo dilatation RIGHT frontal horn of the lateral ventricle. No acute large vascular territory infarcts. No abnormal extra-axial fluid collections. Basal cisterns are  patent. VASCULAR: Moderate calcific atherosclerosis of the carotid siphons. SKULL: No skull fracture. No significant scalp soft tissue swelling. SINUSES/ORBITS: Mild paranasal sinus mucosal thickening. Mastoid air cells are well aerated.LEFT buphthalmos and ocular lens implant. OTHER: Patient is edentulous. ASPECTS [facility] Stroke Program Early CT Score) - Ganglionic level infarction (caudate, lentiform nuclei, internal capsule, insula, M1-M3 cortex): 7 - Supraganglionic infarction (M4-M6 cortex): 3 Total score (0-10 with 10 being normal): 10 IMPRESSION: 1. No acute intracranial process. 2. ASPECTS is 10. 3. Moderate to severe chronic small vessel ischemic changes. 4. 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 5. Critical Value/emergent results text paged to [place], Neurology via [place] secure system on 01/01/2018 at 3:27 am, including interpreting physician's phone number. Electronically Signed   By: Elon Alas M.D.   On: 01/01/2018 03:27   Vas [place] Lower Extremity Venous (dvt)  Result Date: 01/03/2018  Lower Venous Study Indications: Stroke.  Limitations: Body habitus. Performing Technologist: Sharion Dove RVS  Examination Guidelines: A complete evaluation includes B-mode imaging, spectral Doppler, color Doppler, and power Doppler as needed of all accessible portions of each vessel. Bilateral testing is considered an integral part of a complete examination. Limited examinations for reoccurring indications may be performed as noted.  Right Venous Findings: +---------+---------------+---------+-----------+----------+-------+          CompressibilityPhasicitySpontaneityPropertiesSummary +---------+---------------+---------+-----------+----------+-------+ CFV      Full           Yes      Yes                          +---------+---------------+---------+-----------+----------+-------+ SFJ      Full                                                  +---------+---------------+---------+-----------+----------+-------+ FV Prox  Full                                                 +---------+---------------+---------+-----------+----------+-------+ FV Mid   Full                                                 +---------+---------------+---------+-----------+----------+-------+ FV DistalFull                                                 +---------+---------------+---------+-----------+----------+-------+ PFV      Full                                                 +---------+---------------+---------+-----------+----------+-------+ POP      Full           Yes      Yes                          +---------+---------------+---------+-----------+----------+-------+ PTV  Full                                                 +---------+---------------+---------+-----------+----------+-------+  Right Technical Findings: Not visualized segments include peroneal vein.  Left Venous Findings: +---------+---------------+---------+-----------+----------+-------+          CompressibilityPhasicitySpontaneityPropertiesSummary +---------+---------------+---------+-----------+----------+-------+ CFV      Full                                                 +---------+---------------+---------+-----------+----------+-------+ SFJ      Full                                                 +---------+---------------+---------+-----------+----------+-------+ FV Prox  Full                                                 +---------+---------------+---------+-----------+----------+-------+ FV Mid   Full                                                 +---------+---------------+---------+-----------+----------+-------+ FV DistalFull                                                 +---------+---------------+---------+-----------+----------+-------+ PFV      Full                                                  +---------+---------------+---------+-----------+----------+-------+ POP      Full                                                 +---------+---------------+---------+-----------+----------+-------+ PTV      Full                                                 +---------+---------------+---------+-----------+----------+-------+  Left Technical Findings: Not visualized segments include peroneal vein.   Summary: Right: There is no evidence of deep vein thrombosis in the lower extremity. However, portions of this examination were limited- see technologist comments above. Left: There is no evidence of deep vein thrombosis in the lower extremity. However, portions of this examination were limited- see technologist comments above.  *See table(s) above for measurements and observations. Electronically signed by Ruta Hinds MD on 01/03/2018 at 6:56:01 PM.    Final  LAB RESULTS: Basic Metabolic Panel: Recent Labs  Lab 01/02/18 0614 01/03/18 0538  NA 141 143  K 3.6 3.9  CL 110 113*  CO2 23 27  GLUCOSE 119* 97  BUN 14 14  CREATININE 1.30* 1.36*  CALCIUM 9.7 9.9  MG 2.1  --    Liver Function Tests: Recent Labs  Lab 01/01/18 0308  AST 19  ALT 14  ALKPHOS 52  BILITOT 0.3  PROT 7.5  ALBUMIN 3.5   No results for input(s): LIPASE, AMYLASE in the last 168 hours. No results for input(s): AMMONIA in the last 168 hours. CBC: Recent Labs  Lab 01/01/18 0308  01/04/18 0350 01/05/18 0356  WBC 8.2   < > 10.5 11.4*  NEUTROABS 3.7  --   --   --   HGB 12.2*   < > 12.5* 12.1*  HCT 41.5   < > 39.8 38.8*  MCV 89.1   < > 85.6 84.3  PLT 279   < > 243 239   < > = values in this interval not displayed.   Cardiac Enzymes: No results for input(s): CKTOTAL, CKMB, CKMBINDEX, TROPONINI in the last 168 hours. BNP: Invalid input(s): POCBNP CBG: Recent Labs  Lab 01/02/18 0416 01/02/18 0640  GLUCAP 113* 115*      Disposition and Follow-up: Discharge Instructions     Ambulatory referral to Neurology   Complete by:  As directed    Follow up with stroke clinic NP (Jessica Vanschaick or Cecille Rubin, if both not available, consider Zachery Dauer, or [place]) at [facility] in about 4 weeks. Thanks.   Diet - low sodium heart healthy   Complete by:  As directed    Discharge instructions   Complete by:  As directed    Follow with Primary MD Rogers Blocker, MD in 7 days   Get CBC, CMP, 2 view Chest X ray -  checked  by Primary MD or SNF MD in 5-7 days   Activity: As tolerated with Full fall precautions use walker/cane & assistance as needed  Disposition SNF/CIR  Diet: Dysphagia 1 diet-nectar thick liquids, with full feeding assistance and aspiration precautions.  Special Instructions: If you have smoked or chewed Tobacco  in the last 2 yrs please stop smoking, stop any regular Alcohol  and or any Recreational drug use.  On your next visit with your primary care physician please Get Medicines reviewed and adjusted.  Please request your Prim.MD to go over all Hospital Tests and Procedure/Radiological results at the follow up, please get all Hospital records sent to your Prim MD by signing hospital release before you go home.  If you experience worsening of your admission symptoms, develop shortness of breath, life threatening emergency, suicidal or homicidal thoughts you must seek medical attention immediately by calling 911 or calling your MD immediately  if symptoms less severe.   Increase activity slowly   Complete by:  As directed        DISPOSITION: Inpatient rehab   [place] Office Follow up on 01/17/2018.   Specialty:  Cardiology Why:  at [facility] information: [address] [place] [phone]       [name]. Schedule an appointment as soon as possible for a visit in 4 week(s).   Specialty:  Neurology Contact  information: [address] [place], [place],  MD. Schedule an appointment as soon as possible for a visit in 1 week(s).   Specialties:  Vascular Surgery, Cardiology Why:  carotid plaque Contact information: [address] [place] [address] [phone]            Time coordinating discharge:  35 minutes  Signed:   Estill Cotta M.D. [facility] 01/05/2018, 11:38 AM Pager: [phone]

## 2018-01-06 ENCOUNTER — Inpatient Hospital Stay (HOSPITAL_COMMUNITY): Payer: Medicare Other | Admitting: Occupational Therapy

## 2018-01-06 ENCOUNTER — Inpatient Hospital Stay (HOSPITAL_COMMUNITY): Payer: Medicare Other | Admitting: Physical Therapy

## 2018-01-06 ENCOUNTER — Inpatient Hospital Stay (HOSPITAL_COMMUNITY): Payer: Medicare Other

## 2018-01-06 ENCOUNTER — Inpatient Hospital Stay (HOSPITAL_COMMUNITY): Payer: Medicare Other | Admitting: Speech Pathology

## 2018-01-06 DIAGNOSIS — I69391 Dysphagia following cerebral infarction: Secondary | ICD-10-CM

## 2018-01-06 DIAGNOSIS — I69322 Dysarthria following cerebral infarction: Secondary | ICD-10-CM

## 2018-01-06 DIAGNOSIS — G8114 Spastic hemiplegia affecting left nondominant side: Secondary | ICD-10-CM

## 2018-01-06 DIAGNOSIS — I639 Cerebral infarction, unspecified: Secondary | ICD-10-CM

## 2018-01-06 DIAGNOSIS — G460 Middle cerebral artery syndrome: Secondary | ICD-10-CM

## 2018-01-06 LAB — COMPREHENSIVE METABOLIC PANEL
ALBUMIN: 2.9 g/dL — AB (ref 3.5–5.0)
ALT: 12 U/L (ref 0–44)
AST: 17 U/L (ref 15–41)
Alkaline Phosphatase: 45 U/L (ref 38–126)
Anion gap: 7 (ref 5–15)
BUN: 26 mg/dL — AB (ref 8–23)
CHLORIDE: 111 mmol/L (ref 98–111)
CO2: 24 mmol/L (ref 22–32)
CREATININE: 1.42 mg/dL — AB (ref 0.61–1.24)
Calcium: 9.7 mg/dL (ref 8.9–10.3)
GFR calc Af Amer: 54 mL/min — ABNORMAL LOW (ref >60)
GFR calc non Af Amer: 47 mL/min — ABNORMAL LOW (ref >60)
GLUCOSE: 103 mg/dL — AB (ref 70–99)
POTASSIUM: 4.1 mmol/L (ref 3.5–5.1)
Sodium: 142 mmol/L (ref 135–145)
Total Bilirubin: 0.8 mg/dL (ref 0.3–1.2)
Total Protein: 7.3 g/dL (ref 6.5–8.1)

## 2018-01-06 LAB — CBC WITH DIFFERENTIAL/PLATELET
ABS IMMATURE GRANULOCYTES: 0.02 10*3/uL (ref 0.00–0.07)
BASOS ABS: 0 10*3/uL (ref 0.0–0.1)
BASOS PCT: 0 %
EOS ABS: 0.2 10*3/uL (ref 0.0–0.5)
Eosinophils Relative: 2 %
HCT: 40.7 % (ref 39.0–52.0)
Hemoglobin: 12.6 g/dL — ABNORMAL LOW (ref 13.0–17.0)
IMMATURE GRANULOCYTES: 0 %
LYMPHS ABS: 2 10*3/uL (ref 0.7–4.0)
Lymphocytes Relative: 21 %
MCH: 26.2 pg (ref 26.0–34.0)
MCHC: 31 g/dL (ref 30.0–36.0)
MCV: 84.6 fL (ref 80.0–100.0)
Monocytes Absolute: 1.2 10*3/uL — ABNORMAL HIGH (ref 0.1–1.0)
Monocytes Relative: 12 %
NEUTROS ABS: 6.3 10*3/uL (ref 1.7–7.7)
NEUTROS PCT: 65 %
NRBC: 0 % (ref 0.0–0.2)
PLATELETS: 244 10*3/uL (ref 150–400)
RBC: 4.81 MIL/uL (ref 4.22–5.81)
RDW: 16.1 % — ABNORMAL HIGH (ref 11.5–15.5)
WBC: 9.8 10*3/uL (ref 4.0–10.5)

## 2018-01-06 NOTE — Progress Notes (Signed)
Social Work  Social Work Assessment and Plan  Patient Details  Name: [name] MRN: [id number] Date of Birth: [birth year]  Today's Date: 01/06/2018  Problem List:  Patient Active Problem List   Diagnosis Date Noted  . Stroke (cerebrum) ([place]) 01/05/2018  . History of gout   . AKI (acute kidney injury) ([place])   . Stage 3 chronic kidney disease ([place])   . Anemia of chronic disease   . Left-sided weakness   . Osteoarthritis   . OSA (obstructive sleep apnea)   . Noncompliance with CPAP treatment   . History of DVT (deep vein thrombosis)   . History of CVA (cerebrovascular accident)   . Dysphagia, post-stroke   . Sinus tachycardia   . Acute blood loss anemia   . Acute CVA (cerebrovascular accident) ([place]) 01/01/2018  . Hypertensive urgency 01/01/2018  . CKD (chronic kidney disease) stage 2, GFR 60-89 ml/min 01/01/2018  . Generalized weakness   . Sepsis ([place]) 12/10/2016  . Bacteremia due to Streptococcus 12/10/2016  . Lactic acidosis 12/10/2016  . Elevated troponin 12/10/2016  . Renal insufficiency 12/10/2016  . Hypophosphatemia 12/10/2016  . Left leg swelling 12/10/2016  . Febrile illness 12/09/2016  . Febrile illness, acute   . OBESITY, UNSPECIFIED 05/31/2009  . Hyperlipidemia 05/30/2009  . Obstructive sleep apnea 05/30/2009  . Essential hypertension 05/30/2009  . CAD (coronary artery disease) 05/30/2009   Past Medical History:  Past Medical History:  Diagnosis Date  . CAD (coronary artery disease)    s/p cath in 2005 and CABG x4  . Cataract   . DJD (degenerative joint disease)   . DJD (degenerative joint disease)   . Fluid retention    mild  . H/O: gout   . History of blood clots   . HTN (hypertension)   . Hyperlipidemia   . Hyperlipidemia   . Myocardial infarction ([place]) 2009  . Obesity    with reduction  . OSA (obstructive sleep apnea)    AHI 98/hr  . Renal insufficiency   . Sleep apnea   . Stroke [facility] 2000   Past Surgical History:  Past Surgical  History:  Procedure Laterality Date  . CORONARY ARTERY BYPASS GRAFT  2005   LIMA to LAD, SVG to OM1 and OM 2, RCA.   [name] left inguinal hernia repair    . LOOP RECORDER INSERTION N/A 01/03/2018   Procedure: LOOP RECORDER INSERTION;  Surgeon: Evans Lance, MD;  Location: [facility] CV LAB;  Service: Cardiovascular;  Laterality: N/A;  . TEE WITHOUT CARDIOVERSION N/A 12/11/2016   Procedure: TRANSESOPHAGEAL ECHOCARDIOGRAM (TEE);  Surgeon: [name], MD;  Location:  [facility] ENDOSCOPY;  Service: Cardiovascular;  Laterality: N/A;  . TEE WITHOUT CARDIOVERSION N/A 01/03/2018   Procedure: TRANSESOPHAGEAL ECHOCARDIOGRAM (TEE);  Surgeon: Dorothy Spark, MD;  Location: [facility] ENDOSCOPY;  Service: Cardiovascular;  Laterality: N/A;   Social History:  reports that he has quit smoking. His smoking use included cigars. He has never used smokeless tobacco. He reports that he does not drink alcohol or use drugs.  Family / Support Systems Marital Status: Married Patient Roles: Spouse, Parent Spouse/Significant Other: Marolyn Hammock [phone] Children: Mellody Dance [phone] Other Supports: Another daughter and son-whom lives with them Anticipated Caregiver: Wife and son Ability/Limitations of Caregiver: Wife is retired and just finished rad tx 11/7. Son works and other two children work and will Veterinary surgeon Availability: 24/7 Family Dynamics: Close knit family all three children are supportive and involved. They have friends and church members who  are involved and provide support.  Social History Preferred language: English Religion: Baptist Cultural Background: No issues Education: [employer] Read: Yes Write: Yes Employment Status: Retired Freight forwarder Issues: No issues Guardian/Conservator: None-according to MD pt is not fully capable of making his own decisions at this time. Will look toward his wife for any decisions while here, until pt is able too    Abuse/Neglect Abuse/Neglect Assessment Can Be Completed: Yes Physical Abuse: Denies Verbal Abuse: Denies Sexual Abuse: Denies Exploitation of patient/patient's resources: Denies Self-Neglect: Denies  Emotional Status Pt's affect, behavior adn adjustment status: Pt is motivated to do well and wants to get back to his independent level. Family feels he is very driven to regain his function and will do whatever is necessary to reach these goals. Pt was assisting wife through her cancer treatments, which she finished the last one today. Recent Psychosocial Issues: other health issues-thought were managed with PCP.  Pyschiatric History: No history deferred depression screen due to cognitive issues at this time. But do feel he made benefit from seeing neuro-psych while here. Will get input from team and follow when appropriate for services.  Substance Abuse History: No issues  Patient / Family Perceptions, Expectations & Goals Pt/Family understanding of illness & functional limitations: Pt has a basic understanding of his stroke and deficits. Wife and children have spoken with the MD and can explain his treatment plan and workup for CVA. Wife feels he will do well here, due to he is driven to improve and has always been independent and not wanted help from others. Premorbid pt/family roles/activities: Husband, father, retiree, church member, friend, etc Anticipated changes in roles/activities/participation: resume Pt/family expectations/goals: Pt states: " Want to get better." Wife states: " I hope he does well here but we will all pull together to help him."  Daughter states: " I will help but need to work also."  [employer]: None Premorbid Home Care/DME Agencies: None Transportation available at discharge: [employer] referrals recommended: Neuropsychology, Support group (specify)  Discharge Planning Living Arrangements: Spouse/significant other,  Children Support Systems: Spouse/significant other, Children, Water engineer, Social worker community Type of Residence: Private residence Insurance underwriter Resources: Multimedia programmer (specify)(UHC-Medicare) Museum/gallery curator Resources: Fish farm manager, Family Support Financial Screen Referred: No Living Expenses: Own Money Management: Spouse, Patient Does the patient have any problems obtaining your medications?: No Home Management: Both he and wife Patient/Family Preliminary Plans: Return home with wife who is able to assist but not much physically due to finishing up rad tx. Pt is also a large man and wife is small, so will need to be high level before going home. Son works Gaffer the day but is there in the evenings and two daughter's are supportive and involved also. Social Work Anticipated Follow Up Needs: HH/OP, Support Group  Clinical Impression Pleasant gentleman who is motivated to do well and recover from this stroke. His family is very supportive and involved and will assist at discharge. Wife rang bell today last day of radiation treatment. Await therapy evaluations to work on discharge needs. Will wait until pt is more appropriate for neuro-psych to see.  Elease Hashimoto 01/06/2018, 1:22 PM

## 2018-01-06 NOTE — Progress Notes (Signed)
Occupational Therapy Session Note  Patient Details  Name: [name] MRN: [id number] Date of Birth: Jul 21, 1940  Today's Date: 01/06/2018 OT Individual Time: 1400-1425 OT Individual Time Calculation (min): 25 min    Short Term Goals: Week 1:  OT Short Term Goal 1 (Week 1): Pt will be able to wash UB with min A. OT Short Term Goal 1 - Progress (Week 1): Other (comment) OT Short Term Goal 2 (Week 1): Pt will be able to wash LB with mod A. OT Short Term Goal 3 (Week 1): Pt will able to don shirt with mod A. OT Short Term Goal 4 (Week 1): Pt will be able to don pant with max A. OT Short Term Goal 5 (Week 1): Pt will be able to transfer to [facility] with mod A.  Skilled Therapeutic Interventions/Progress Updates:  Pt resting in bed upon arrival with wife and daughter present.  OT intervention with focus on education, purpose of OT, LUE NMR and PROM/AAROM. Family educated on purpose of OT and LTGs, LUE NMR, and therapy schedule.  Pt with trace biceps and internal rotators.  Pt unable to rotate head to L past neutral. Pt remained in bed with wife and daughter present.  All needs within reach.   Therapy Documentation Precautions:  Precautions Precautions: Fall Restrictions Weight Bearing Restrictions: No Pain:  Pt c/o discomfort back of neck (unrated); repositioning and soft tissue mobilizations   Therapy/Group: Individual Therapy  Leroy Libman 01/06/2018, 2:41 PM

## 2018-01-06 NOTE — Care Management Note (Signed)
Inpatient [facility] Individual Statement of Services  Patient Name:  [name]  Date:  01/06/2018  Welcome to the [facility].  Our goal is to provide you with an individualized program based on your diagnosis and situation, designed to meet your specific needs.  With this comprehensive rehabilitation program, you will be expected to participate in at least 3 hours of rehabilitation therapies Monday-Friday, with modified therapy programming on the weekends.  Your rehabilitation program will include the following services:  Physical Therapy (PT), Occupational Therapy (OT), Speech Therapy (ST), 24 hour per day rehabilitation nursing, Neuropsychology, Case Management (Social Worker), Rehabilitation Medicine, Nutrition Services and Pharmacy Services  Weekly team conferences will be held on Wedensday to discuss your progress.  Your Social Worker will talk with you frequently to get your input and to update you on team discussions.  Team conferences with you and your family in attendance may also be held.  Expected length of stay: 15-19 days  Overall anticipated outcome: supervision with cueing  Depending on your progress and recovery, your program may change. Your Social Worker will coordinate services and will keep you informed of any changes. Your Social Worker's name and contact numbers are listed  below.  The following services may also be recommended but are not provided by the Walker will be made to provide these services after discharge if needed.  Arrangements include referral to agencies that provide these services.  Your insurance has been verified to be:  UHC-Medicare Your primary doctor is:  Kevan Ny  Pertinent information will be shared with your doctor and your insurance company.  Social Worker:  Ovidio Kin, [place] or ([phone]  Information discussed with and copy given to patient by: Elease Hashimoto, 01/06/2018, 11:38 AM

## 2018-01-06 NOTE — Evaluation (Signed)
Occupational Therapy Assessment and Plan  Patient Details  Name: [name] MRN: [id number] Date of Birth: 1940-03-05  OT Diagnosis: cognitive deficits, disturbance of vision, flaccid hemiplegia and hemiparesis, hemiplegia affecting non-dominant side and muscle weakness (generalized) Rehab Potential: Rehab Potential (ACUTE ONLY): Good ELOS: 15-19 days   Today's Date: 01/06/2018 OT Individual Time: 5465-6812 OT Individual Time Calculation (min): 60 min     Problem List:  Patient Active Problem List   Diagnosis Date Noted  . 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 History of gout   . AKI (acute kidney injury) ([place])   . Stage 3 chronic kidney disease ([place])   . Anemia of chronic disease   . Left-sided weakness   . Osteoarthritis   . OSA (obstructive sleep apnea)   . Noncompliance with CPAP treatment   . History of DVT (deep vein thrombosis)   . History of CVA (cerebrovascular accident)   . Dysphagia, post-stroke   . Sinus tachycardia   . Acute blood loss anemia   . Acute CVA (cerebrovascular accident) ([place]) 01/01/2018  . Hypertensive urgency 01/01/2018  . CKD (chronic kidney disease) stage 2, GFR 60-89 ml/min 01/01/2018  . Generalized weakness   . Sepsis ([place]) 12/10/2016  . Bacteremia due to Streptococcus 12/10/2016  . Lactic acidosis 12/10/2016  . Elevated troponin 12/10/2016  . Renal insufficiency 12/10/2016  . Hypophosphatemia 12/10/2016  . Left leg swelling 12/10/2016  . Febrile illness 12/09/2016  . Febrile illness, acute   . OBESITY, UNSPECIFIED 05/31/2009  . Hyperlipidemia 05/30/2009  . Obstructive sleep apnea 05/30/2009  . Essential hypertension 05/30/2009  . CAD (coronary artery disease) 05/30/2009    Past Medical History:  Past Medical History:  Diagnosis Date  . CAD (coronary artery disease)    s/p cath in 2005 and CABG x4  . Cataract   . DJD (degenerative joint disease)   . DJD (degenerative joint disease)   . Fluid retention    mild  . H/O: gout    . History of blood clots   . HTN (hypertension)   . Hyperlipidemia   . Hyperlipidemia   . Myocardial infarction ([place]) 2009  . Obesity    with reduction  . OSA (obstructive sleep apnea)    AHI 98/hr  . Renal insufficiency   . Sleep apnea   . Stroke [facility]) 2000   Past Surgical History:  Past Surgical History:  Procedure Laterality Date  . CORONARY ARTERY BYPASS GRAFT  2005   LIMA to LAD, SVG to OM1 and OM 2, RCA.   [name] left inguinal hernia repair    . LOOP RECORDER INSERTION N/A 01/03/2018   Procedure: LOOP RECORDER INSERTION;  Surgeon: Evans Lance, MD;  Location: [place] CV LAB;  Service: Cardiovascular;  Laterality: N/A;  . TEE WITHOUT CARDIOVERSION N/A 12/11/2016   Procedure: TRANSESOPHAGEAL ECHOCARDIOGRAM (TEE);  Surgeon: [name], MD;  Location: [facility] ENDOSCOPY;  Service: Cardiovascular;  Laterality: N/A;  . TEE WITHOUT CARDIOVERSION N/A 01/03/2018   Procedure: TRANSESOPHAGEAL ECHOCARDIOGRAM (TEE);  Surgeon: Dorothy Spark, MD;  Location: [facility] ENDOSCOPY;  Service: Cardiovascular;  Laterality: N/A;    Assessment & Plan Clinical Impression: [name] is a 77 year old male with history of CAD, DJD, OSA--noncompliant with CPAP due to frequency, history of DVTs, CVA; who was admitted on 01/01/2018 with acute onset of left facial weakness, dysarthria, left-sided weakness and fall.  History taken from chart review, family, and patient.  CT perfusion done revealing small acute right frontal/MCA penumbra and CTA head/neck showed  moderate cerebral artery atherosclerosis with mid stenosis right-to be origin.  MRI brain reviewed, showing multiple right CVA.  Showed multiple right infarcts and remote hemorrhagic infarct right thalamic.  Patient had issues with lethargy with difficulty handling secretions and MBS done 11/3 with initiation of dysphagia 1 and nectar liquids as well as strict aspiration precautions.  2D echo done revealing EF of 60 to 65% with no wall abnormality and  aortic sclerosis without stenosis.  Bilateral lower extreme Dopplers were negative for DVT.  Carotid Dopplers showed large soft plaque in the right ICA bifurcation however no significant stenosis.  He was started on aspirin and Plavix for embolic stroke felt to be from right ICA soft plaque versus cardioembolic source.  TEE done for work-up and showed no evidence of thrombus and negative bubble study.  Loop recorder placed by Dr. Lovena Le.   Patient transferred to CIR on 01/05/2018 .    Patient currently requires max with basic self-care skills secondary to muscle weakness, decreased cardiorespiratoy endurance, abnormal tone and decreased coordination, decreased visual perceptual skills, decreased visual motor skills and field cut, decreased attention to left, decreased awareness, decreased problem solving and decreased memory and decreased sitting balance, decreased standing balance, decreased postural control, hemiplegia and decreased balance strategies.  Prior to hospitalization, patient was fully independent and driving.  Patient will benefit from skilled intervention to increase independence with basic self-care skills prior to discharge home with care partner.  Anticipate patient will require 24 hour supervision and follow up home health.  OT - End of Session Activity Tolerance: Tolerates 10 - 20 min activity with multiple rests OT Assessment Rehab Potential (ACUTE ONLY): Good OT Barriers to Discharge: Home environment access/layout OT Barriers to Discharge Comments: main bathroom and bedroom on 2nd floor OT Patient demonstrates impairments in the following area(s): Balance;Endurance;Motor;Cognition;Perception;Safety;Vision OT Basic ADL's Functional Problem(s): Grooming;Bathing;Toileting;Dressing OT Transfers Functional Problem(s): Tub/Shower;Toilet OT Additional Impairment(s): Fuctional Use of Upper Extremity OT Plan OT Intensity: Minimum of 1-2 x/day, 45 to 90 minutes OT Frequency: 5 out of  7 days OT Duration/Estimated Length of Stay: 15-19 days OT Treatment/Interventions: Balance/vestibular training;Cognitive remediation/compensation;Discharge planning;Functional mobility training;Neuromuscular re-education;Functional electrical stimulation;DME/adaptive equipment instruction;Psychosocial support;Therapeutic Activities;UE/LE Strength taining/ROM;Visual/perceptual remediation/compensation;UE/LE Coordination activities;Therapeutic Exercise;Self Care/advanced ADL retraining;Patient/family education OT Self Feeding Anticipated Outcome(s): I OT Basic Self-Care Anticipated Outcome(s): supervision OT Toileting Anticipated Outcome(s): supervision OT Bathroom Transfers Anticipated Outcome(s): supervision OT Recommendation Patient destination: Home Follow Up Recommendations: Home health OT Equipment Recommended: 3 in 1 bedside comode   Skilled Therapeutic Intervention Pt seen for initial evaluation and initiation of ADL and mobility skills. Pt received sitting upright in bed alert and engaging in conversation well.  Pt followed directions well to scan to his L. He also did quite a bit of joking around.  Pt's neck is painful from his fall and he resists rotating his neck so rolling in bed is very difficulty and pt needs max to total A with rolling, but once he is on his R side he can push up to sitting with max A and then maintain static sit balance with close S at EOB.  To transfer to w/c, used Stedy lift. Pt pulled to stand with CGA and maintained upright posture well.  Transferred to w/c.  Self care LB performed bed level and UB in w/c with max A.   Pt's LUE is flaccid and he will need to spend time learning how to manage it.  Discussed OT POC, goals, estimated LOS.    In w/c pt set up  with chair belt alarm and pillows to support his arm.  All needs met. Call light in reach.   OT Evaluation Precautions/Restrictions  Precautions Precautions: Fall Restrictions Weight Bearing Restrictions:  No   Pain  no c/o pain Home Living/Prior Functioning Home Living Family/patient expects to be discharged to:: Private residence Living Arrangements: Spouse/significant other, Children Available Help at Discharge: Family Type of Home: House Home Layout: Two level Bathroom Shower/Tub: Tub/shower unit  Lives With: Spouse Prior Function Level of Independence: Independent with basic ADLs, Independent with homemaking with ambulation  Able to Take Stairs?: Yes Driving: Yes Vocation: Retired Leisure: Hobbies-yes (Comment) Comments: Pt likes to cook and hang out with friends at [place] ADL ADL Eating: Set up Grooming: Minimal assistance Where Assessed-Grooming: Bed level Upper Body Bathing: Moderate assistance Where Assessed-Upper Body Bathing: Bed level Lower Body Bathing: Maximal assistance Where Assessed-Lower Body Bathing: Bed level Upper Body Dressing: Maximal assistance Where Assessed-Upper Body Dressing: Wheelchair Lower Body Dressing: Dependent Where Assessed-Lower Body Dressing: Bed level Toileting: Not assessed Toilet Transfer: Not assessed Walk-In Shower Transfer: Not assessed Vision Baseline Vision/History: Wears glasses Wears Glasses: At all times Patient Visual Report: Peripheral vision impairment Vision Assessment?: Yes Eye Alignment: Impaired (comment) Ocular Range of Motion: Restricted on the left Alignment/Gaze Preference: Gaze right Tracking/Visual Pursuits: Left eye does not track laterally;Decreased smoothness of horizontal tracking;Decreased smoothness of vertical tracking;Unable to hold eye position out of midline Saccades: Impaired - to be further tested in functional context Convergence: Impaired (comment) Visual Fields: Left visual field deficit Perception  Perception: Impaired Inattention/Neglect: Does not attend to left visual field;Does not attend to left side of body Praxis Praxis: Intact Cognition Overall Cognitive Status:  Impaired/Different from baseline Arousal/Alertness: Awake/alert Orientation Level: Person;Place;Situation Person: Oriented Place: Oriented Situation: Oriented Year: 2019 Month: November Day of Week: Incorrect Memory: Impaired Memory Impairment: Decreased short term memory Immediate Memory Recall: Sock;Blue;Bed Memory Recall: Sock;Blue;Bed Memory Recall Sock: Without Cue Memory Recall Blue: Without Cue Memory Recall Bed: Without Cue Attention: Selective Sustained Attention: Impaired Sustained Attention Impairment: Verbal basic;Functional basic Awareness: Impaired Awareness Impairment: Intellectual impairment Problem Solving: Impaired Problem Solving Impairment: Functional basic Safety/Judgment: Impaired Sensation Sensation Light Touch: Appears Intact Hot/Cold: Not tested Proprioception: Appears Intact Stereognosis: Not tested Coordination Gross Motor Movements are Fluid and Coordinated: No Fine Motor Movements are Fluid and Coordinated: No Coordination and Movement Description: LUE flaccid Finger Nose Finger Test: not tested Motor  Motor Motor: Hemiplegia;Abnormal tone Mobility    refer to CARE TOOL Trunk/Postural Assessment  Cervical Assessment Cervical Assessment: Exceptions to WFL(Pt has limited neck ROM due to pain from recent fall) Thoracic Assessment Thoracic Assessment: Within Functional Limits Lumbar Assessment Lumbar Assessment: Exceptions to WFL(posterior pelvic tilt) Postural Control Postural Control: Deficits on evaluation Righting Reactions: delayed  Balance Static Sitting Balance Static Sitting - Level of Assistance: 5: Stand by assistance Dynamic Sitting Balance Dynamic Sitting - Level of Assistance: 3: Mod assist Static Standing Balance Static Standing - Level of Assistance: 3: Mod assist Extremity/Trunk Assessment RUE Assessment RUE Assessment: Within Functional Limits LUE Assessment LUE Assessment: Exceptions to [facility] Passive Range of  Motion (PROM) Comments: WFL General Strength Comments: 0/5 LUE Body System: Neuro Brunstrum levels for arm and hand: Arm;Hand Brunstrum level for arm: Stage I Presynergy Brunstrum level for hand: Stage I Flaccidity     Refer to Care Plan for Long Term Goals  Recommendations for other services: None    Discharge Criteria: Patient will be discharged from OT if patient refuses treatment 3 consecutive times without  medical reason, if treatment goals not met, if there is a change in medical status, if patient makes no progress towards goals or if patient is discharged from hospital.  The above assessment, treatment plan, treatment alternatives and goals were discussed and mutually agreed upon: by patient  [place] 01/06/2018, 1:02 PM

## 2018-01-06 NOTE — Evaluation (Signed)
Speech Language Pathology Assessment and Plan  Patient Details  Name: [name] MRN: [id number] Date of Birth: Jul 16, 1940  SLP Diagnosis: Dysarthria;Cognitive Impairments;Dysphagia  Rehab Potential: Excellent ELOS: 15-19 days     Today's Date: 01/06/2018 SLP Individual Time: 0700-0800 SLP Individual Time Calculation (min): 60 min   Problem List:  Patient Active Problem List   Diagnosis Date Noted  . 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 History of gout   . AKI (acute kidney injury) ([place])   . Stage 3 chronic kidney disease ([place])   . Anemia of chronic disease   . Left-sided weakness   . Osteoarthritis   . OSA (obstructive sleep apnea)   . Noncompliance with CPAP treatment   . History of DVT (deep vein thrombosis)   . History of CVA (cerebrovascular accident)   . Dysphagia, post-stroke   . Sinus tachycardia   . Acute blood loss anemia   . Acute CVA (cerebrovascular accident) ([place]) 01/01/2018  . Hypertensive urgency 01/01/2018  . CKD (chronic kidney disease) stage 2, GFR 60-89 ml/min 01/01/2018  . Generalized weakness   . Sepsis ([place]) 12/10/2016  . Bacteremia due to Streptococcus 12/10/2016  . Lactic acidosis 12/10/2016  . Elevated troponin 12/10/2016  . Renal insufficiency 12/10/2016  . Hypophosphatemia 12/10/2016  . Left leg swelling 12/10/2016  . Febrile illness 12/09/2016  . Febrile illness, acute   . OBESITY, UNSPECIFIED 05/31/2009  . Hyperlipidemia 05/30/2009  . Obstructive sleep apnea 05/30/2009  . Essential hypertension 05/30/2009  . CAD (coronary artery disease) 05/30/2009   Past Medical History:  Past Medical History:  Diagnosis Date  . CAD (coronary artery disease)    s/p cath in 2005 and CABG x4  . Cataract   . DJD (degenerative joint disease)   . DJD (degenerative joint disease)   . Fluid retention    mild  . H/O: gout   . History of blood clots   . HTN (hypertension)   . Hyperlipidemia   . Hyperlipidemia   . Myocardial infarction ([place])  2009  . Obesity    with reduction  . OSA (obstructive sleep apnea)    AHI 98/hr  . Renal insufficiency   . Sleep apnea   . Stroke [facility]) 2000   Past Surgical History:  Past Surgical History:  Procedure Laterality Date  . CORONARY ARTERY BYPASS GRAFT  2005   LIMA to LAD, SVG to OM1 and OM 2, RCA.   [name] left inguinal hernia repair    . LOOP RECORDER INSERTION N/A 01/03/2018   Procedure: LOOP RECORDER INSERTION;  Surgeon: Evans Lance, MD;  Location: [place] CV LAB;  Service: Cardiovascular;  Laterality: N/A;  . TEE WITHOUT CARDIOVERSION N/A 12/11/2016   Procedure: TRANSESOPHAGEAL ECHOCARDIOGRAM (TEE);  Surgeon: [name], MD;  Location: [facility] ENDOSCOPY;  Service: Cardiovascular;  Laterality: N/A;  . TEE WITHOUT CARDIOVERSION N/A 01/03/2018   Procedure: TRANSESOPHAGEAL ECHOCARDIOGRAM (TEE);  Surgeon: Dorothy Spark, MD;  Location: [facility] ENDOSCOPY;  Service: Cardiovascular;  Laterality: N/A;    Assessment / Plan / Recommendation Clinical Impression 77 y.o. male with history of CAD, DJD, OSA--non compliant due to frequency,  h/o DVTs, CVA; who was admitted on 01/01/18 with acute onset of left facial weakness with dysarthria, left sided weakness and fall.  History taken from chart review, family, and patient.  CT perfusion of head done revealing small acute right frontal/MCA penumbra and CTA head/neck showed moderate cerebral artery atherosclerosis with mid stenosis R-VA origin. MRI brain showed reviewed, showing multiple right  infarcts.  Per report, acute/subacute infarcts involving right frontal operculum and diffusely along right frontal and parietal lobes and remote hemorrhagic infarct in right thalamus.  Patient with lethargy with wet voice and difficulty handling secretions. MBS done on 11/3 and he was started on dysphagia 1, nectar liquids with strict aspiration precautions.  2D echo done revealing EF 60-65% with no wall abnormalities and aortic sclerosis without stenosis. BLE  dopplers were negative for DVT.  Carotid Doppler showed right ICA bifurcation large soft plaque however no significant stenosis. He was started on aspirin Plavix for embolic stroke felt to be from R-ICA soft plaque versus cardioembolic source.  TEE ordered for work-up.  Patient on aspirin Plavix for secondary stroke prevention. Patient admitted to [facility] 01/05/18.  Patient demonstrates a moderate dysarthria characterized by an increased speech rate and imprecise consonants due to left oral-motor weakness impacting intelligibility at the phrase level. Patient also demonstrates a mild-moderate oral dysphagia due to oral weakness resulting in oral residue and left anterior spillage that patient requires cues to clear with Dys. 1 textures.  Patient also demonstrated what appeared to be a timely swallow initiation, however, an intermittent swallowing delay noted due to decreased awareness of bolus, especially at end of meal due to fatigue. Patient's safety at meals also impacted by fatigue and impulsivity with both thin liquids via provale cup and nectar-thick liquids via straw. Recommend patient continue current diet with sips of thin liquids via tsp or provale cup allowed with family and staff. Moderate cognitive impairments impacting sustained attention, functional problem solving, awareness, recall, attention to left enviornment and overall safety due to impulsivity also noted throughout evaluation. Patient would benefit from skilled SLP intervention to maximize his cognitive, speech and swallowing function prior to discharge.    Skilled Therapeutic Interventions          Patient administered a cognitive-linguistic evaluation and BSE, please see above for details. Educated patient in regards to current cognitive, speech and swallowing impairments and goals of skilled SLP intervention. He verbalized understanding.    SLP Assessment  Patient will need skilled Speech Lanaguage Pathology Services during CIR admission     Recommendations  SLP Diet Recommendations: Dysphagia 1 (Puree);Nectar Liquid Administration via: Straw;Cup Medication Administration: Crushed with puree Supervision: Patient able to self feed;Full supervision/cueing for compensatory strategies Compensations: Small sips/bites;Slow rate;Follow solids with liquid;Minimize environmental distractions;Monitor for anterior loss Postural Changes and/or Swallow Maneuvers: Seated upright 90 degrees Oral Care Recommendations: Oral care QID Patient destination: Home Follow up Recommendations: 24 hour supervision/assistance;Home Health SLP;Outpatient SLP Equipment Recommended: To be determined    SLP Frequency 3 to 5 out of 7 days   SLP Duration  SLP Intensity  SLP Treatment/Interventions 15-19 days   Minumum of 1-2 x/day, 30 to 90 minutes  Cognitive remediation/compensation;Dysphagia/aspiration precaution training;Internal/external aids;Speech/Language facilitation;Therapeutic Activities;Environmental controls;Cueing hierarchy;Functional tasks;Patient/family education    Pain No/Denies Pain  Short Term Goals: Week 1: SLP Short Term Goal 1 (Week 1): Patient will consume current diet with minimal overt s/s of aspiration and supervision verbal cues for use of swallowing strategies.  SLP Short Term Goal 2 (Week 1): Patient will consume thin liquids via provale cup without overt s/s of aspiration with Min A verbal cues over 2 sessions prior to upgrade.  SLP Short Term Goal 3 (Week 1): Patient will demonstrate sustained attention to tasks for ~5 minutes with Min A verbal cues for redirection.  SLP Short Term Goal 4 (Week 1): Patient will attend to left field of enviornment/body during functional tasks  with Mod A verbal cues.  SLP Short Term Goal 5 (Week 1): Patient will demonstrate basic problem solving for functional and familiar tasks with Mod A verbal cues.  SLP Short Term Goal 6 (Week 1): Patient will utilize speech intelligibility strategies  at the phrase level to achieve ~75% intelligibility with Min A verbal cues.   Refer to Care Plan for Long Term Goals  Recommendations for other services: Neuropsych  Discharge Criteria: Patient will be discharged from SLP if patient refuses treatment 3 consecutive times without medical reason, if treatment goals not met, if there is a change in medical status, if patient makes no progress towards goals or if patient is discharged from hospital.  The above assessment, treatment plan, treatment alternatives and goals were discussed and mutually agreed upon: by patient  Ira Dougher 01/06/2018, 8:18 AM

## 2018-01-06 NOTE — Evaluation (Addendum)
Physical Therapy Assessment and Plan  Patient Details  Name: [name] MRN: [id number] Date of Birth: Aug 23, 1940  PT Diagnosis: Abnormality of gait, Cognitive deficits, Difficulty walking, Hemiplegia non-dominant, Impaired cognition and Muscle weakness Rehab Potential: Good ELOS: 14-18 days   Today's Date: 01/06/2018 PT Individual Time: 1100-1200 PT Individual Time Calculation (min): 60 min    Problem List:  Patient Active Problem List   Diagnosis Date Noted  . 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 History of gout   . AKI (acute kidney injury) ([place])   . Stage 3 chronic kidney disease ([place])   . Anemia of chronic disease   . Left-sided weakness   . Osteoarthritis   . OSA (obstructive sleep apnea)   . Noncompliance with CPAP treatment   . History of DVT (deep vein thrombosis)   . History of CVA (cerebrovascular accident)   . Dysphagia, post-stroke   . Sinus tachycardia   . Acute blood loss anemia   . Acute CVA (cerebrovascular accident) ([place]) 01/01/2018  . Hypertensive urgency 01/01/2018  . CKD (chronic kidney disease) stage 2, GFR 60-89 ml/min 01/01/2018  . Generalized weakness   . Sepsis ([place]) 12/10/2016  . Bacteremia due to Streptococcus 12/10/2016  . Lactic acidosis 12/10/2016  . Elevated troponin 12/10/2016  . Renal insufficiency 12/10/2016  . Hypophosphatemia 12/10/2016  . Left leg swelling 12/10/2016  . Febrile illness 12/09/2016  . Febrile illness, acute   . OBESITY, UNSPECIFIED 05/31/2009  . Hyperlipidemia 05/30/2009  . Obstructive sleep apnea 05/30/2009  . Essential hypertension 05/30/2009  . CAD (coronary artery disease) 05/30/2009    Past Medical History:  Past Medical History:  Diagnosis Date  . CAD (coronary artery disease)    s/p cath in 2005 and CABG x4  . Cataract   . DJD (degenerative joint disease)   . DJD (degenerative joint disease)   . Fluid retention    mild  . H/O: gout   . History of blood clots   . HTN (hypertension)   .  Hyperlipidemia   . Hyperlipidemia   . Myocardial infarction ([place]) 2009  . Obesity    with reduction  . OSA (obstructive sleep apnea)    AHI 98/hr  . Renal insufficiency   . Sleep apnea   . Stroke [facility]) 2000   Past Surgical History:  Past Surgical History:  Procedure Laterality Date  . CORONARY ARTERY BYPASS GRAFT  2005   LIMA to LAD, SVG to OM1 and OM 2, RCA.   [name] left inguinal hernia repair    . LOOP RECORDER INSERTION N/A 01/03/2018   Procedure: LOOP RECORDER INSERTION;  Surgeon: Evans Lance, MD;  Location: [facility] LAB;  Service: Cardiovascular;  Laterality: N/A;  . TEE WITHOUT CARDIOVERSION N/A 12/11/2016   Procedure: TRANSESOPHAGEAL ECHOCARDIOGRAM (TEE);  Surgeon: [name], MD;  Location: [facility] ENDOSCOPY;  Service: Cardiovascular;  Laterality: N/A;  . TEE WITHOUT CARDIOVERSION N/A 01/03/2018   Procedure: TRANSESOPHAGEAL ECHOCARDIOGRAM (TEE);  Surgeon: Dorothy Spark, MD;  Location: [facility] ENDOSCOPY;  Service: Cardiovascular;  Laterality: N/A;    Assessment & Plan Clinical Impression: Patient is a 77 year old male with history of CAD, DJD, OSA--noncompliant with CPAP due to frequency, history of DVTs, CVA; who was admitted on 01/01/2018 with acute onset of left facial weakness, dysarthria, left-sided weakness and fall.  History taken from chart review, family, and patient.  CT perfusion done revealing small acute right frontal/MCA penumbra and CTA head/neck showed moderate cerebral artery atherosclerosis with mid stenosis right-to be  origin.  MRI brain reviewed, showing multiple right CVA.  Showed multiple right infarcts and remote hemorrhagic infarct right thalamic.  Patient had issues with lethargy with difficulty handling secretions and MBS done 11/3 with initiation of dysphagia 1 and nectar liquids as well as strict aspiration precautions.  2D echo done revealing EF of 60 to 65% with no wall abnormality and aortic sclerosis without stenosis.  Bilateral lower extreme  Dopplers were negative for DVT.  Carotid Dopplers showed large soft plaque in the right ICA bifurcation however no significant stenosis.  He was started on aspirin and Plavix for embolic stroke felt to be from right ICA soft plaque versus cardioembolic source.  TEE done for work-up and showed no evidence of thrombus and negative bubble study.  Loop recorder placed by Dr. Lovena Le. Patient transferred to CIR on 01/05/2018 .   Patient currently requires min with mobility secondary to muscle weakness, decreased cardiorespiratoy endurance, unbalanced muscle activation and decreased motor planning, decreased visual perceptual skills, decreased visual motor skills and field cut, decreased attention to left, decreased problem solving, decreased safety awareness, decreased memory and delayed processing and decreased standing balance, decreased postural control, hemiplegia and decreased balance strategies.  Prior to hospitalization, patient was independent  with mobility and lived with Spouse, Son in a House home.  Home access is 2 stairsStairs to enter.  Patient will benefit from skilled PT intervention to maximize safe functional mobility, minimize fall risk and decrease caregiver burden for planned discharge home with 24 hour supervision.  Anticipate patient will benefit from follow up [place] at discharge.  PT - End of Session Activity Tolerance: Tolerates < 10 min activity, no significant change in vital signs Endurance Deficit: Yes Endurance Deficit Description: decreased PT Assessment Rehab Potential (ACUTE/IP ONLY): Good PT Barriers to Discharge: Home environment access/layout PT Barriers to Discharge Comments: bedroom/shower on 2nd floor w/ flight of stairs to reach PT Patient demonstrates impairments in the following area(s): Balance;Endurance;Motor;Perception;Safety PT Transfers Functional Problem(s): Bed Mobility;Bed to Chair;Car;Floor PT Locomotion Functional Problem(s): Stairs;Wheelchair  Mobility;Ambulation PT Plan PT Intensity: Minimum of 1-2 x/day ,45 to 90 minutes PT Frequency: 5 out of 7 days PT Duration Estimated Length of Stay: 14-18 days PT Treatment/Interventions: Ambulation/gait training;Community reintegration;DME/adaptive equipment instruction;Neuromuscular re-education;Psychosocial support;Stair training;UE/LE Strength taining/ROM;Wheelchair propulsion/positioning;UE/LE Coordination activities;Therapeutic Activities;Skin care/wound management;Pain management;Functional electrical stimulation;Discharge planning;Balance/vestibular training;Cognitive remediation/compensation;Disease management/prevention;Functional mobility training;Patient/family education;Splinting/orthotics;Therapeutic Exercise;Visual/perceptual remediation/compensation PT Transfers Anticipated Outcome(s): supervision PT Locomotion Anticipated Outcome(s): supervision household gait w/ LRAD PT Recommendation Recommendations for Other Services: Neuropsych consult Follow Up Recommendations: Home health PT Patient destination: Home Equipment Recommended: To be determined  Skilled Therapeutic Intervention  Pt in w/c and agreeable to therapy, denies pain. Performed functional mobility as detailed below including gait, w/c mobility, transfers, and bed mobility. Switched to w/c w/ higher back 2/2 pt's height. Pt required frequent seated rest breaks 2/2 fatigue and lability. Provided frequent encouragement and therapeutic listening. Pt eager to return to his baseline. Educated pt and family in typical stroke recovery in light of pt's deficits. Instructed pt and children (son and daughter) in results of PT evaluation as detailed below, PT POC, rehab potential, rehab goals, and discharge recommendations. Additionally discussed CIR's policies regarding fall safety and use of chair alarm and/or quick release belt. Pt and family verbalized understanding and in agreement. Ended session in supine, all needs in  reach.  PT Evaluation Precautions/Restrictions Precautions Precautions: Fall Restrictions Weight Bearing Restrictions: No Home Living/Prior Functioning Home Living Available Help at Discharge: Family;Available 24 hours/day(wife available 24/7, son works )  Type of Home: House Home Access: Stairs to enter [employer] of Steps: 2 stairs Entrance Stairs-Rails: None(family looking into installing bilateral rails prior to d/c) Home Layout: Two level;Able to live on main level with bedroom/bathroom;Bed/bath upstairs(unsure of # of stairs, 1/2 bath on 1st floor only) Bathroom Shower/Tub: Tub/shower unit  Lives With: Spouse;Son Prior Function Level of Independence: Independent with basic ADLs;Independent with homemaking with ambulation;Independent with gait;Independent with transfers  Able to Take Stairs?: Yes Driving: Yes Vocation: Retired Leisure: Hobbies-yes (Comment) Comments: Pt likes to cook and hang out with friends at [employer] Vision/Perception  Vision - Assessment Eye Alignment: Impaired (comment) Ocular Range of Motion: Restricted on the left Alignment/Gaze Preference: Gaze right(could also be 2/2 difficulty/pain w/ L neck rotation) Tracking/Visual Pursuits: Left eye does not track laterally;Decreased smoothness of horizontal tracking;Decreased smoothness of vertical tracking;Unable to hold eye position out of midline Perception Perception: Impaired Inattention/Neglect: Does not attend to left visual field;Does not attend to left side of body Praxis Praxis: Intact  Cognition Overall Cognitive Status: Impaired/Different from baseline Arousal/Alertness: Awake/alert Orientation Level: Oriented X4 Sustained Attention: Impaired Memory: Impaired Memory Impairment: Decreased short term memory Awareness: Impaired Problem Solving: Impaired Safety/Judgment: Impaired Comments: decreased safety awareness and decreased awareness of deficits Sensation Sensation Light  Touch: Appears Intact Coordination Gross Motor Movements are Fluid and Coordinated: No Fine Motor Movements are Fluid and Coordinated: No Coordination and Movement Description: Decreased coordination w/ LLE movements, suspect 2/2 weakness Motor  Motor Motor: Hemiplegia Motor - Skilled Clinical Observations: L hemi, UE>LE  Mobility Bed Mobility Bed Mobility: Rolling Right;Rolling Left;Sit to Supine;Supine to Sit Rolling Right: Maximal Assistance - Patient 25-49% Rolling Left: Maximal Assistance - Patient 25-49% Supine to Sit: Moderate Assistance - Patient 50-74% Sit to Supine: Moderate Assistance - Patient 50-74% Transfers Transfers: Sit to Stand;Stand to Sit;Stand Pivot Transfers Sit to Stand: Minimal Assistance - Patient > 75% Stand to Sit: Minimal Assistance - Patient > 75% Stand Pivot Transfers: Minimal Assistance - Patient > 75% Stand Pivot Transfer Details: Manual facilitation for placement;Manual facilitation for weight shifting;Verbal cues for technique;Visual cues/gestures for sequencing;Tactile cues for placement;Tactile cues for posture Transfer (Assistive device): 1 person hand held assist Locomotion  Gait Ambulation: Yes Gait Assistance: Minimal Assistance - Patient > 75% Gait Distance (Feet): 30 Feet Assistive device: Other (Comment)(rail in hallway) Gait Assistance Details: Manual facilitation for weight shifting;Tactile cues for initiation;Verbal cues for technique;Verbal cues for gait pattern;Verbal cues for precautions/safety Gait Gait: Yes Gait Pattern: Impaired Gait Pattern: Trunk flexed;Poor foot clearance - left Gait velocity: Decreased Stairs / Additional Locomotion Stairs: No Wheelchair Mobility Wheelchair Mobility: Yes Wheelchair Assistance: Minimal assistance - Patient >75% Wheelchair Propulsion: Right upper extremity;Right lower extremity Wheelchair Parts Management: Needs assistance Distance: 150'  Trunk/Postural Assessment  Cervical  Assessment Cervical Assessment: Exceptions to WFL(Unable to rotate to L side at all 2/2 neck pain after fall @ home) Thoracic Assessment Thoracic Assessment: Within Functional Limits Lumbar Assessment Lumbar Assessment: Exceptions to WFL(posterior pelvic tilt) Postural Control Postural Control: Deficits on evaluation Righting Reactions: delayed  Balance Balance Balance Assessed: Yes Static Sitting Balance Static Sitting - Balance Support: No upper extremity supported;Feet supported Static Sitting - Level of Assistance: 5: Stand by assistance Dynamic Sitting Balance Dynamic Sitting - Balance Support: No upper extremity supported;Feet supported;During functional activity Dynamic Sitting - Level of Assistance: 4: Min Insurance risk surveyor Standing - Balance Support: Right upper extremity supported Static Standing - Level of Assistance: 4: Min assist Dynamic Standing Balance Dynamic Standing - Balance Support:  Right upper extremity supported Dynamic Standing - Level of Assistance: 4: Min assist Extremity Assessment  RLE Assessment RLE Assessment: Within Functional Limits LLE Assessment LLE Assessment: Exceptions to [facility] Passive Range of Motion (PROM) Comments: WFL General Strength Comments: 3- to 4/5 globally    Refer to Care Plan for Long Term Goals  Recommendations for other services: Neuropsych  Discharge Criteria: Patient will be discharged from PT if patient refuses treatment 3 consecutive times without medical reason, if treatment goals not met, if there is a change in medical status, if patient makes no progress towards goals or if patient is discharged from hospital.  The above assessment, treatment plan, treatment alternatives and goals were discussed and mutually agreed upon: by patient and by family  Boy Delamater Clent Demark 01/06/2018, 6:12 PM

## 2018-01-06 NOTE — Progress Notes (Addendum)
[place] PHYSICAL MEDICINE & REHABILITATION PROGRESS NOTE   Subjective/Complaints:  Pt appreciative of rehab effort wet voice, with cough ROS- neg CP SOB, N/V/D Objective:   Ct Cervical Spine Wo Contrast  Result Date: 01/04/2018 CLINICAL DATA:  Fall with neck trauma. Assess for cervical fracture. EXAM: CT CERVICAL SPINE WITHOUT CONTRAST TECHNIQUE: Multidetector CT imaging of the cervical spine was performed without intravenous contrast. Multiplanar CT image reconstructions were also generated. COMPARISON:  01/01/2018. FINDINGS: Alignment: No traumatic malalignment.  No degenerative subluxation. Skull base and vertebrae: No fracture or primary bone lesion. Soft tissues and spinal canal: No evidence of soft tissue injury. Disc levels: Foramen magnum is widely patent. There is arthritis at the C1-2 articulation which could be osteoarthritis or CPPD. No compression of the neural structures. C2-3: Disc bulge.  No compressive canal or foraminal narrowing. C3-4: Disc bulge with annular calcification. Mild narrowing of the left side of the canal. Foramina appear sufficiently patent. C4-5: Spondylosis with endplate osteophytes. Mild canal narrowing. Bilateral foraminal narrowing right more than left. C5-6: Spondylosis with endplate osteophytes. Moderate canal stenosis. Mild bilateral foraminal narrowing. Bilateral ligamentous calcification. C6-7: Endplate osteophytes with moderate to marked canal stenosis. Bilateral foraminal stenosis. Bilateral ligamentous calcification. C7-T1: Facet osteoarthritis. Small endplate osteophytes. No significant canal stenosis. Mild foraminal narrowing. Upper chest: Negative Other: None IMPRESSION: No acute or traumatic finding. Chronic degenerative changes as outlined above. Spinal stenosis which could be significant at C5-6 and C6-7. Foraminal narrowing as noted above. Electronically Signed   By: Nelson Chimes M.D.   On: 01/04/2018 16:44   Recent Labs    01/05/18 0356  01/06/18 0532  WBC 11.4* 9.8  HGB 12.1* 12.6*  HCT 38.8* 40.7  PLT 239 244   Recent Labs    01/06/18 0532  NA 142  K 4.1  CL 111  CO2 24  GLUCOSE 103*  BUN 26*  CREATININE 1.42*  CALCIUM 9.7    Intake/Output Summary (Last 24 hours) at 01/06/2018 0826 Last data filed at 01/06/2018 0807 Gross per 24 hour  Intake 240 ml  Output 125 ml  Net 115 ml     Physical Exam: Vital Signs Blood pressure (!) 138/92, pulse (!) 104, temperature 100.3 F (37.9 C), temperature source Oral, resp. rate (!) 21, height 6\' 2"  (1.88 m), weight 120.4 kg, SpO2 96 %.     Assessment/Plan: 1. Functional deficits secondary to Right MCA infarct with Left hemiparesis, dysarthria and Dysphagia which require 3+ hours per day of interdisciplinary therapy in a comprehensive inpatient rehab setting.  Physiatrist is providing close team supervision and 24 hour management of active medical problems listed below.  Physiatrist and rehab team continue to assess barriers to discharge/monitor patient progress toward functional and medical goals  Care Tool:  Bathing              Bathing assist       Upper Body Dressing/Undressing Upper body dressing        Upper body assist      Lower Body Dressing/Undressing Lower body dressing            Lower body assist       Toileting Toileting    Toileting assist Assist for toileting: Moderate Assistance - Patient 50 - 74%     Transfers Chair/bed transfer  Transfers assist           Locomotion Ambulation   Ambulation assist              Walk 10 feet  activity   Assist           Walk 50 feet activity   Assist           Walk 150 feet activity   Assist           Walk 10 feet on uneven surface  activity   Assist           Wheelchair     Assist               Wheelchair 50 feet with 2 turns activity    Assist            Wheelchair 150 feet activity     Assist            Medical Problem List and Plan: 1.  Deficits with mobility, transfers, self-care, speech, swallowing secondary to multiple right infarcts acute sub acute in MCA as well as MCA/ACA watershed distribution and remote hemorrhagic infarct right thalamic.   CIR Evals today 2.  DVT Prophylaxis/Anticoagulation: Pharmaceutical: Lovenox 3. Neck/shoulder pain/Pain Management: Muscle strain due to left inattention. Will add K pad in addition to Sportscreme for local measures.  4. Mood: LCSW to follow for evaluation and support.  5. Neuropsych: This patient is not fully capable of making decisions on his own behalf. 6. Skin/Wound Care: Routine pressure relief measures.  7. Fluids/Electrolytes/Nutrition: Monitor I/O. Check lytes in am. Nocturnal hydration.  8.  Essential hypertension: continue Norvasc and lopressor--avoid hypotension. Permissive hypertension to allow for perfusion. 9.  CAD: Continue aspirin, lopressor and statin.  Resume beta-blocker as blood pressures stabilized. Vitals:   01/05/18 1951 01/06/18 0545  BP: 138/83 (!) 138/92  Pulse: 90 (!) 104  Resp: 18 (!) 21  Temp: 99.1 F (37.3 C) 100.3 F (37.9 C)  SpO2: 97% 96%  BP in desired range but HR trending up will cont to monitor off BB 10.  History of gout: On allopurinol 11.  Acute on chronic renal failure: Baseline serum creatinine at 1.4?  improved with hydration.  Will add IV fluids at nights due to dysphagia diet. 12.  Anemia of chronic disease: Monitor for now 13. H/o peripheral edema: has 2-3+ edema at baseline. Will add TEDs.     LOS: 1 days A FACE TO FACE EVALUATION WAS PERFORMED  Charlett Blake 01/06/2018, 8:26 AM

## 2018-01-06 NOTE — Progress Notes (Signed)
Patient information reviewed and entered into eRehab system by Brucha Ahlquist, RN, CRRN, PPS Coordinator.  Information including medical coding, functional ability and quality indicators will be reviewed and updated through discharge.     Per nursing patient was given "Data Collection Information Summary for Patients in Inpatient Rehabilitation Facilities with attached "Privacy Act Statement-Health Care Records" upon admission.  

## 2018-01-07 ENCOUNTER — Inpatient Hospital Stay (HOSPITAL_COMMUNITY): Payer: Medicare Other | Admitting: Physical Therapy

## 2018-01-07 ENCOUNTER — Inpatient Hospital Stay (HOSPITAL_COMMUNITY): Payer: Medicare Other

## 2018-01-07 ENCOUNTER — Inpatient Hospital Stay (HOSPITAL_COMMUNITY): Payer: Medicare Other | Admitting: Speech Pathology

## 2018-01-07 NOTE — Plan of Care (Signed)
  Problem: Consults Goal: RH STROKE PATIENT EDUCATION Description See Patient Education module for education specifics  Outcome: Progressing   Problem: RH BOWEL ELIMINATION Goal: RH STG MANAGE BOWEL WITH ASSISTANCE Description STG Manage Bowel with [place].  Outcome: Progressing Goal: RH STG MANAGE BOWEL W/MEDICATION W/ASSISTANCE Description STG Manage Bowel with Medication with min, Assistance.  Outcome: Progressing   Problem: RH BLADDER ELIMINATION Goal: RH STG MANAGE BLADDER WITH ASSISTANCE Description STG Manage Bladder With Mod. Assistance  Outcome: Progressing   Problem: RH SKIN INTEGRITY Goal: RH STG SKIN FREE OF INFECTION/BREAKDOWN Description With Mod. Assist.  Outcome: Progressing Goal: RH STG MAINTAIN SKIN INTEGRITY WITH ASSISTANCE Description STG Maintain Skin Integrity With Mod.Assistance.  Outcome: Progressing   Problem: RH SAFETY Goal: RH STG ADHERE TO SAFETY PRECAUTIONS W/ASSISTANCE/DEVICE Description STG Adhere to Safety Precautions With Mod.Assistance/Device.  Outcome: Progressing   Problem: RH PAIN MANAGEMENT Goal: RH STG PAIN MANAGED AT OR BELOW PT'S PAIN GOAL Description Less than 3,on 1 to 10 scale  Outcome: Progressing   Problem: RH KNOWLEDGE DEFICIT Goal: RH STG INCREASE KNOWLEDGE OF HYPERTENSION Description Able to verbalized the importance to keep BP under control with diet,medication and activity  Outcome: Progressing Goal: RH STG INCREASE KNOWLEDGE OF DYSPHAGIA/FLUID INTAKE Description Able to verbalized the importance of Dys. Diet.  Outcome: Progressing Goal: RH STG INCREASE KNOWLEDGE OF STROKE PROPHYLAXIS Outcome: Progressing   Problem: RH Vision Goal: RH LTG Vision (Specify) Outcome: Progressing

## 2018-01-07 NOTE — IPOC Note (Signed)
Overall Plan of Care [facility]) Patient Details Name: [name] MRN: [id number] DOB: 12-03-1940  Admitting Diagnosis: <principal problem not specified>  Hospital Problems: Active Problems:   Stroke (cerebrum) [facility])     Functional Problem List: Nursing Endurance, Motor, Pain, Safety, Skin Integrity  PT Balance, Endurance, Motor, Perception, Safety  OT Balance, Endurance, Motor, Cognition, Perception, Safety, Vision  SLP Cognition, Nutrition  TR         Basic ADL's: OT Grooming, Bathing, Toileting, Dressing     Advanced  ADL's: OT       Transfers: PT Bed Mobility, Bed to Chair, Car, Floor  OT Tub/Shower, Toilet     Locomotion: PT Stairs, Emergency planning/management officer, Ambulation     Additional Impairments: OT Fuctional Use of Upper Extremity  SLP Swallowing, Communication, Social Cognition expression Problem Solving, Memory, Attention, Awareness  TR      Anticipated Outcomes Item Anticipated Outcome  Self Feeding I  Swallowing  Supervision   Basic self-care  supervision  Toileting  supervision   Bathroom Transfers supervision  Bowel/Bladder  Continent to bowel and bladder with min. assist.  Transfers  supervision  Locomotion  supervision household gait w/ LRAD  Communication  Supervision  Cognition  Supervision   Pain  Less than 3,on 1 to 10 scale  Safety/Judgment  Free from falls during his stay in rehab.   Therapy Plan: PT Intensity: Minimum of 1-2 x/day ,45 to 90 minutes PT Frequency: 5 out of 7 days PT Duration Estimated Length of Stay: 14-18 days OT Intensity: Minimum of 1-2 x/day, 45 to 90 minutes OT Frequency: 5 out of 7 days OT Duration/Estimated Length of Stay: 15-19 days SLP Intensity: Minumum of 1-2 x/day, 30 to 90 minutes SLP Frequency: 3 to 5 out of 7 days SLP Duration/Estimated Length of Stay: 15-19 days     Team Interventions: Nursing Interventions Patient/Family Education, Pain Management, Dysphagia/Aspiration Precaution Training,  Discharge Planning, Disease Management/Prevention  PT interventions Ambulation/gait training, Community reintegration, DME/adaptive equipment instruction, Neuromuscular re-education, Psychosocial support, Stair training, UE/LE Strength taining/ROM, Wheelchair propulsion/positioning, UE/LE Coordination activities, Therapeutic Activities, Skin care/wound management, Pain management, Functional electrical stimulation, Discharge planning, Training and development officer, Cognitive remediation/compensation, Disease management/prevention, Functional mobility training, Patient/family education, Splinting/orthotics, Therapeutic Exercise, Visual/perceptual remediation/compensation  OT Interventions Balance/vestibular training, Cognitive remediation/compensation, Discharge planning, Functional mobility training, Neuromuscular re-education, Functional electrical stimulation, DME/adaptive equipment instruction, Psychosocial support, Therapeutic Activities, UE/LE Strength taining/ROM, Visual/perceptual remediation/compensation, UE/LE Coordination activities, Therapeutic Exercise, Self Care/advanced ADL retraining, Patient/family education  SLP Interventions Cognitive remediation/compensation, Dysphagia/aspiration precaution training, Internal/external aids, Speech/Language facilitation, Therapeutic Activities, Environmental controls, Cueing hierarchy, Functional tasks, Patient/family education  TR Interventions    SW/CM Interventions Discharge Planning, Patient/Family Education, Psychosocial Support   Barriers to Discharge MD  Medical stability  Nursing      PT Home environment access/layout bedroom/shower on 2nd floor w/ flight of stairs to reach  OT Home environment access/layout main bathroom and bedroom on 2nd floor  SLP      SW       Team Discharge Planning: Destination: PT-Home ,OT- Home , SLP-Home Projected Follow-up: PT-Home health PT, OT-  Home health OT, SLP-24 hour supervision/assistance, Home Health  SLP, Outpatient SLP Projected Equipment Needs: PT-To be determined, OT- 3 in 1 bedside comode, SLP-To be determined Equipment Details: PT- , OT-  Patient/family involved in discharge planning: PT- Patient, Family member/caregiver,  OT-Patient, SLP-Patient  MD ELOS: 14-18d Medical Rehab Prognosis:  Good Assessment:   77 year old male with history of CAD, DJD, OSA--noncompliant with CPAP due to  frequency, history of DVTs, CVA; who was admitted on 01/01/2018 with acute onset of left facial weakness, dysarthria, left-sided weakness and fall.  History taken from chart review, family, and patient.  CT perfusion done revealing small acute right frontal/MCA penumbra and CTA head/neck showed moderate cerebral artery atherosclerosis with mid stenosis right-to be origin.  MRI brain reviewed, showing multiple right CVA.  Showed multiple right infarcts and remote hemorrhagic infarct right thalamic.  Patient had issues with lethargy with difficulty handling secretions and MBS done 11/3 with initiation of dysphagia 1 and nectar liquids as well as strict aspiration precautions.  2D echo done revealing EF of 60 to 65% with no wall abnormality and aortic sclerosis without stenosis.  Bilateral lower extreme Dopplers were negative for DVT.  Carotid Dopplers showed large soft plaque in the right ICA bifurcation however no significant stenosis.  He was started on aspirin and Plavix for embolic stroke felt to be from right ICA soft plaque versus cardioembolic source.  TEE done for work-up and showed no evidence of thrombus and negative bubble study.  Loop recorder placed by Dr. Lovena Le.   Now requiring 24/7 Rehab RN,MD, as well as CIR level PT, OT and SLP.  Treatment team will focus on ADLs and mobility with goals set at Sup  See Team Conference Notes for weekly updates to the plan of care

## 2018-01-07 NOTE — Progress Notes (Signed)
[facility] PHYSICAL MEDICINE & REHABILITATION PROGRESS NOTE   Subjective/Complaints:  Pt working with OT who is a Chief Strategy Officer" ROS- neg CP SOB, N/V/D Objective:   No results found. Recent Labs    01/05/18 0356 01/06/18 0532  WBC 11.4* 9.8  HGB 12.1* 12.6*  HCT 38.8* 40.7  PLT 239 244   Recent Labs    01/06/18 0532  NA 142  K 4.1  CL 111  CO2 24  GLUCOSE 103*  BUN 26*  CREATININE 1.42*  CALCIUM 9.7    Intake/Output Summary (Last 24 hours) at 01/07/2018 0918 Last data filed at 01/07/2018 0700 Gross per 24 hour  Intake 440 ml  Output 625 ml  Net -185 ml     Physical Exam: Vital Signs Blood pressure (!) 147/87, pulse 94, temperature 98.1 F (36.7 C), resp. rate 20, height 6\' 2"  (1.88 m), weight 120.4 kg, SpO2 100 %.     Assessment/Plan: 1. Functional deficits secondary to Right MCA infarct with Left hemiparesis, dysarthria and Dysphagia which require 3+ hours per day of interdisciplinary therapy in a comprehensive inpatient rehab setting.  Physiatrist is providing close team supervision and 24 hour management of active medical problems listed below.  Physiatrist and rehab team continue to assess barriers to discharge/monitor patient progress toward functional and medical goals  Care Tool:  Bathing    Body parts bathed by patient: Front perineal area, Right upper leg, Face, Chest, Abdomen   Body parts bathed by helper: Buttocks, Left upper leg, Right lower leg, Left lower leg, Right arm, Left arm     Bathing assist Assist Level: Moderate Assistance - Patient 50 - 74%     Upper Body Dressing/Undressing Upper body dressing   What is the patient wearing?: Pull over shirt    Upper body assist Assist Level: Total Assistance - Patient < 25%    Lower Body Dressing/Undressing Lower body dressing      What is the patient wearing?: Pants     Lower body assist Assist for lower body dressing: Total Assistance - Patient < 25%      Toileting Toileting    Toileting assist Assist for toileting: Moderate Assistance - Patient 50 - 74%     Transfers Chair/bed transfer  Transfers assist     Chair/bed transfer assist level: Minimal Assistance - Patient > 75%     Locomotion Ambulation   Ambulation assist      Assist level: Minimal Assistance - Patient > 75% Assistive device: Other (comment)(rail in hallway) Max distance: 30'   Walk 10 feet activity   Assist     Assist level: Minimal Assistance - Patient > 75% Assistive device: Other (comment)(rail in hallway)   Walk 50 feet activity   Assist Walk 50 feet with 2 turns activity did not occur: Safety/medical concerns         Walk 150 feet activity   Assist Walk 150 feet activity did not occur: Safety/medical concerns         Walk 10 feet on uneven surface  activity   Assist Walk 10 feet on uneven surfaces activity did not occur: Safety/medical concerns         Wheelchair     Assist Will patient use wheelchair at discharge?: (TBD) Type of Wheelchair: Manual    Wheelchair assist level: Minimal Assistance - Patient > 75% Max wheelchair distance: 150'    Wheelchair 50 feet with 2 turns activity    Assist        Assist Level:  Minimal Assistance - Patient > 75%   Wheelchair 150 feet activity     Assist     Assist Level: Minimal Assistance - Patient > 75%     Medical Problem List and Plan: 1.  Deficits with mobility, transfers, self-care, speech, swallowing secondary to multiple right infarcts acute sub acute in MCA as well as MCA/ACA watershed distribution and remote hemorrhagic infarct right thalamic.   CIRPT, OT,  SLP 2.  DVT Prophylaxis/Anticoagulation: Pharmaceutical: Lovenox 3. Neck/shoulder pain/Pain Management: Muscle strain due to left inattention. Will add K pad in addition to Sportscreme for local measures.  4. Mood: LCSW to follow for evaluation and support.  5. Neuropsych: This patient is  not fully capable of making decisions on his own behalf. 6. Skin/Wound Care: Routine pressure relief measures.  7. Fluids/Electrolytes/Nutrition: Monitor I/O. Check lytes in am. Nocturnal hydration.  8.  Essential hypertension: continue Norvasc and lopressor--avoid hypotension. Permissive hypertension to allow for perfusion. 9.  CAD: Continue aspirin, lopressor and statin.  Resume beta-blocker as blood pressures stabilized. Vitals:   01/06/18 2048 01/07/18 0645  BP: (!) 152/91 (!) 147/87  Pulse: 93 94  Resp: 18 20  Temp: 98.7 F (37.1 C) 98.1 F (36.7 C)  SpO2: 98% 100%  BP in desired range but HR trending up will cont to monitor off BB HR mildly elevated monitor 10.  History of gout: On allopurinol 11.  Acute on chronic renal failure: Baseline serum creatinine at 1.4?  improved with hydration.  Will add IV fluids at nights due to dysphagia diet.until intake improved 12.  Anemia of chronic disease: Monitor for now 13. H/o peripheral edema: has 2-3+ edema at baseline. Will add TEDs.     LOS: 2 days A FACE TO FACE EVALUATION WAS PERFORMED  Charlett Blake 01/07/2018, 9:18 AM

## 2018-01-07 NOTE — Progress Notes (Signed)
Physical Therapy Session Note  Patient Details  Name: [name] MRN: [id number] Date of Birth: Jan 09, 1941  Today's Date: 01/07/2018 PT Individual Time: 1130-1200 and 1430-1530 PT Individual Time Calculation (min): 30 min and 60 min   Short Term Goals: Week 1:  PT Short Term Goal 1 (Week 1): Pt will initiate stair training PT Short Term Goal 2 (Week 1): Pt will ambulate 50' w/ LRAD w/ min assist PT Short Term Goal 3 (Week 1): Pt will participate in 60 min of upright activity w/o increase in fatigue PT Short Term Goal 4 (Week 1): Pt will attend to L environment/body w/ min cues 50% of the time  Skilled Therapeutic Interventions/Progress Updates:    Session 1: Pt received seated in w/c in room, agreeable to PT. Pt reports some pain in neck region, relief with muscle cream and heating pad to neck. Dependent to don socks and shoes. Sit to stand with min A to rail in hallway with v/c for transfer technique and anterior weight shift during transfer. Ambulation x 30 ft with rail in hallway and min A for balance, manual cues for weight shift L/R and verbal cues for increased step length with LLE. Standing forward/backward step with LLE with rail in hallway for support, min A with focus on weight shift L/R with step. Manual w/c propulsion x 100 ft with R UE/LE and min A for steering, v/c to keep head up and be aware of obstacles. Pt left seated in w/c in room with needs in reach, quick release belt and chair alarm in place.  Session 2: Pt received seated in recliner, agreeable to PT. No complaints of pain. Stedy transfer recliner to w/c. Pt is max A to stand from recliner height. Squat pivot transfer w/c to mat table with min A. Sit to supine mod A for BLE management. Supine bridges 2 x 10 reps with manual cues for L glute activation. Supine BKFO x 10 reps with assist for LLE stability. Supine to sit with mod A for trunk control and LLE management. Squat pivot transfer mat to w/c with min A to the R.  Standing in stedy with focus on reaching outside BOS for horseshoes and then return to midline, min to mod A for standing balance. Pt fatigues quickly with standing activity. Squat pivot transfer w/c to bed min A to the R. Sit to supine mod A for BLE management. Pt left supine in bed with needs in reach and bed alarm in place.  Therapy Documentation Precautions:  Precautions Precautions: Fall Restrictions Weight Bearing Restrictions: No   Therapy/Group: Individual Therapy  Excell Seltzer, PT, DPT  01/07/2018, 3:40 PM

## 2018-01-07 NOTE — Progress Notes (Signed)
Speech Language Pathology Daily Session Note  Patient Details  Name: [name] MRN: [id number] Date of Birth: April 18, 1940  Today's Date: 01/07/2018 SLP Individual Time: 4098-1191 SLP Individual Time Calculation (min): 60 min  Short Term Goals: Week 1: SLP Short Term Goal 1 (Week 1): Patient will consume current diet with minimal overt s/s of aspiration and supervision verbal cues for use of swallowing strategies.  SLP Short Term Goal 2 (Week 1): Patient will consume thin liquids via provale cup without overt s/s of aspiration with Min A verbal cues over 2 sessions prior to upgrade.  SLP Short Term Goal 3 (Week 1): Patient will demonstrate sustained attention to tasks for ~5 minutes with Min A verbal cues for redirection.  SLP Short Term Goal 4 (Week 1): Patient will attend to left field of enviornment/body during functional tasks with Mod A verbal cues.  SLP Short Term Goal 5 (Week 1): Patient will demonstrate basic problem solving for functional and familiar tasks with Mod A verbal cues.  SLP Short Term Goal 6 (Week 1): Patient will utilize speech intelligibility strategies at the phrase level to achieve ~75% intelligibility with Min A verbal cues.   Skilled Therapeutic Interventions: Skilled treatment session focused on dysphagia and cognitive goals. SLP facilitated session by providing skilled observation with a snack of Dys. 1 textures and thin liquids via the provale cup. Patient consumed trials without overt s/s of aspiration but required Min A verbal cues for a slow rate of self-feeding and to self-monitor and correct anterior spillage. Intermittent verbal cues also needed for secretion managament. SLP also facilitated session by providing extra time and Min-Mod A verbal cues for functional problem solving with a basic money management task and Max A verbal cues for selective attention to task due to verbosity. Patient left upright in wheelchair with all needs within reach and alarm on.  Continue with current plan of care.      Pain No/Denies Pain   Therapy/Group: Individual Therapy  Faatima Tench 01/07/2018, 12:52 PM

## 2018-01-07 NOTE — Progress Notes (Signed)
Occupational Therapy Session Note  Patient Details  Name: [name] MRN: [id number] Date of Birth: 1940-03-15  Today's Date: 01/07/2018 OT Individual Time: 0900-1000 OT Individual Time Calculation (min): 60 min    Short Term Goals: Week 1:  OT Short Term Goal 1 (Week 1): Pt will be able to wash UB with min A. OT Short Term Goal 1 - Progress (Week 1): Other (comment) OT Short Term Goal 2 (Week 1): Pt will be able to wash LB with mod A. OT Short Term Goal 3 (Week 1): Pt will able to don shirt with mod A. OT Short Term Goal 4 (Week 1): Pt will be able to don pant with max A. OT Short Term Goal 5 (Week 1): Pt will be able to transfer to [facility] with mod A.  Skilled Therapeutic Interventions/Progress Updates:    OT intervention with focus on bed mobility, sitting balance, functional transfers, sit<>stand, standing balance, hemi bathing/dressing techniques, activity tolerance, and safety awareness to increase independence with BADLs.  Pt required max A for rolling onto R and pushing up to sit EOB.  Pt remained seated EOB with supervision to complete UB bathing/dressing tasks. Pt performed scoot/squat transfer to w/c with max A.  Pt requires min A for sit<>stand and min A for standing balance.  Pt with trace L scapula protraction/retration and adduction but not using LUE functionally in tasks.  Pt instructed in hemi dressing techniques but requires max A to complete UB dressing.  Pt remained seated in w/c with seat belt alarm activated and needs within reach.   Therapy Documentation Precautions:  Precautions Precautions: Fall Restrictions Weight Bearing Restrictions: No  Pain: Pain Assessment Pain Scale: Faces Faces Pain Scale: Hurts little more Pain Type: Acute pain Pain Location: Neck Pain Orientation: Right;Left;Lateral Pain Descriptors / Indicators: Aching;Discomfort Pain Onset: On-going Pain Intervention(s): Medication (See eMAR);Other (Comment);Heat applied(tylenol, muscle rub,  voltaren)   Therapy/Group: Individual Therapy  Leroy Libman 01/07/2018, 12:13 PM

## 2018-01-08 ENCOUNTER — Inpatient Hospital Stay (HOSPITAL_COMMUNITY): Payer: Medicare Other | Admitting: Physical Therapy

## 2018-01-08 ENCOUNTER — Inpatient Hospital Stay (HOSPITAL_COMMUNITY): Payer: Medicare Other | Admitting: Speech Pathology

## 2018-01-08 ENCOUNTER — Inpatient Hospital Stay (HOSPITAL_COMMUNITY): Payer: Medicare Other

## 2018-01-08 DIAGNOSIS — I63512 Cerebral infarction due to unspecified occlusion or stenosis of left middle cerebral artery: Secondary | ICD-10-CM

## 2018-01-08 DIAGNOSIS — I10 Essential (primary) hypertension: Secondary | ICD-10-CM

## 2018-01-08 NOTE — Progress Notes (Signed)
Occupational Therapy Session Note  Patient Details  Name: [name] MRN: [id number] Date of Birth: Aug 27, 1940  Today's Date: 01/08/2018 OT Individual Time: 1048-1200 OT Individual Time Calculation (min): 72 min    Short Term Goals: Week 1:  OT Short Term Goal 1 (Week 1): Pt will be able to wash UB with min A. OT Short Term Goal 1 - Progress (Week 1): Other (comment) OT Short Term Goal 2 (Week 1): Pt will be able to wash LB with mod A. OT Short Term Goal 3 (Week 1): Pt will able to don shirt with mod A. OT Short Term Goal 4 (Week 1): Pt will be able to don pant with max A. OT Short Term Goal 5 (Week 1): Pt will be able to transfer to [facility] with mod A.  Skilled Therapeutic Interventions/Progress Updates:    1;1.Pt recived seated in bed. tp completes supine>sitting with MOD A for LE and trunk elevation. Pt stand pivot transfer with min A for balance during pivot. Pt completes LB dressing with MIN A for pulling pants past hips as well as balance. Pt completes peri washing with min A for balance in standing with VC for equal weight bearing into B feet. Pt sips preferred thing liquid drink from proval cup after attempting with teaspoon and coughing (per SLP this is pt aspirating)the patient demo no cough with proval cup. Pt tolerates 15 min NMES on small muscle atrophy setting (level 22) with good response of wrist extension. Pt tolerated well visualizing waving at grand kids as looking at reflection of hand movement in mirror. No redness of skin after 15 min complete. Pt returned to room with all needs met, call light in reach and exit alarm on and RN in room delivering medication   Therapy Documentation Precautions:  Precautions Precautions: Fall Restrictions Weight Bearing Restrictions: No   Therapy/Group: Individual Therapy  Tonny Branch 01/08/2018, 11:45 AM

## 2018-01-08 NOTE — Progress Notes (Signed)
Physical Therapy Session Note  Patient Details  Name: [name] MRN: [id number] Date of Birth: Sep 12, 1940  Today's Date: 01/08/2018 PT Individual Time: 1300-1330 AND 1600-1700 PT Individual Time Calculation (min): 30 min AND 60 min   Short Term Goals: Week 1:  PT Short Term Goal 1 (Week 1): Pt will initiate stair training PT Short Term Goal 2 (Week 1): Pt will ambulate 50' w/ LRAD w/ min assist PT Short Term Goal 3 (Week 1): Pt will participate in 60 min of upright activity w/o increase in fatigue PT Short Term Goal 4 (Week 1): Pt will attend to L environment/body w/ min cues 50% of the time  Skilled Therapeutic Interventions/Progress Updates:   Session 1:  Pt in w/c and agreeable to therapy, denies pain. Total assist w/c transport to/from therapy gym. Worked on sit<>stands and gait w/ RW and LUE orthosis this session. Min assist sit<>stands and min assist gait 50' x2 reps. Verbal, manual, and tactile cues for posture and RW management, especially at turns. Pt able to increased L foot clearance and step length w/ verbal cues. Returned to room and ended session in w/c and in care of son, all needs met.   Session 2:  Pt in w/c and agreeable to therapy, denies pain. Total assist w/c transport to/from therapy gym. Worked on standing balance while performing RUE clothespin reaching task, min assist w/o RW support and min verbal/tactile cues for hip balance strategies. Worked on L hip flexor activation w/ alternating step taps to 3" step w/ RW support and min assist for lateral weight shifting and emphasis on slow and controlled movement. Ambulated 46' after this w/ improved volitional control of L foot clearance when given verbal cues. Practiced negotiating 4 steps w/ min assist and verbal cues for step-to pattern. Pt very encouraged by this. Returned to room via w/c, multiple family members, including wife present in room. Educated wife on pt's CLOF, PT POC, LTGs, and d/c recommendations as well  as stroke recovery. She had not been present at eval and was appreciative of the information. Ended session in w/c and in care of RN, all needs met.   Therapy Documentation Precautions:  Precautions Precautions: Fall Restrictions Weight Bearing Restrictions: No Vital Signs: Therapy Vitals Temp: 98.2 F (36.8 C) Temp Source: Oral Pulse Rate: 79 Resp: 18 BP: (!) 152/86 Patient Position (if appropriate): Sitting Oxygen Therapy SpO2: 100 % O2 Device: Room Air Pain: Pain Assessment Pain Scale: 0-10 Pain Score: 0-No pain  Therapy/Group: Individual Therapy  [name] K Loreal Schuessler 01/08/2018, 5:08 PM

## 2018-01-08 NOTE — Progress Notes (Addendum)
[place] PHYSICAL MEDICINE & REHABILITATION PROGRESS NOTE   Subjective/Complaints:  Pt is not aware of his therapy schedule today ROS- neg CP SOB, N/V/D Objective:   No results found. Recent Labs    01/06/18 0532  WBC 9.8  HGB 12.6*  HCT 40.7  PLT 244   Recent Labs    01/06/18 0532  NA 142  K 4.1  CL 111  CO2 24  GLUCOSE 103*  BUN 26*  CREATININE 1.42*  CALCIUM 9.7    Intake/Output Summary (Last 24 hours) at 01/08/2018 0845 Last data filed at 01/08/2018 0522 Gross per 24 hour  Intake 240 ml  Output 525 ml  Net -285 ml     Physical Exam: Vital Signs Blood pressure (!) 141/86, pulse 83, temperature 99 F (37.2 C), temperature source Oral, resp. rate 18, height 6\' 2"  (1.88 m), weight 120.4 kg, SpO2 98 %.     Assessment/Plan: 1. Functional deficits secondary to Right MCA infarct with Left hemiparesis, dysarthria and Dysphagia which require 3+ hours per day of interdisciplinary therapy in a comprehensive inpatient rehab setting.  Physiatrist is providing close team supervision and 24 hour management of active medical problems listed below.  Physiatrist and rehab team continue to assess barriers to discharge/monitor patient progress toward functional and medical goals  Care Tool:  Bathing    Body parts bathed by patient: Left arm, Chest, Abdomen, Right upper leg, Left upper leg, Right lower leg   Body parts bathed by helper: Right arm, Buttocks, Left lower leg, Front perineal area     Bathing assist Assist Level: Moderate Assistance - Patient 50 - 74%     Upper Body Dressing/Undressing Upper body dressing   What is the patient wearing?: Pull over shirt    Upper body assist Assist Level: Total Assistance - Patient < 25%    Lower Body Dressing/Undressing Lower body dressing      What is the patient wearing?: Pants, Incontinence brief     Lower body assist Assist for lower body dressing: Total Assistance - Patient < 25%      Toileting Toileting    Toileting assist Assist for toileting: Moderate Assistance - Patient 50 - 74%     Transfers Chair/bed transfer  Transfers assist     Chair/bed transfer assist level: Minimal Assistance - Patient > 75%     Locomotion Ambulation   Ambulation assist      Assist level: Minimal Assistance - Patient > 75% Assistive device: Other (comment)(rail in hallway) Max distance: 30'   Walk 10 feet activity   Assist     Assist level: Minimal Assistance - Patient > 75% Assistive device: Other (comment)(rail in hallway)   Walk 50 feet activity   Assist Walk 50 feet with 2 turns activity did not occur: Safety/medical concerns         Walk 150 feet activity   Assist Walk 150 feet activity did not occur: Safety/medical concerns         Walk 10 feet on uneven surface  activity   Assist Walk 10 feet on uneven surfaces activity did not occur: Safety/medical concerns         Wheelchair     Assist Will patient use wheelchair at discharge?: (TBD) Type of Wheelchair: Manual    Wheelchair assist level: Minimal Assistance - Patient > 75% Max wheelchair distance: 100'    Wheelchair 50 feet with 2 turns activity    Assist        Assist Level: Minimal Assistance -  Patient > 75%   Wheelchair 150 feet activity     Assist     Assist Level: Minimal Assistance - Patient > 75%     Medical Problem List and Plan: 1.  Deficits with mobility, transfers, self-care, speech, swallowing secondary to multiple right infarcts acute sub acute in MCA as well as MCA/ACA watershed distribution and remote hemorrhagic infarct right thalamic.   CIRPT, OT,  SLP 2.  DVT Prophylaxis/Anticoagulation: Pharmaceutical: Lovenox 3. Neck/shoulder pain/Pain Management: Muscle strain due to left inattention. Will add K pad in addition to Sportscreme for local measures.  4. Mood: LCSW to follow for evaluation and support.  5. Neuropsych: This patient is  not fully capable of making decisions on his own behalf. 6. Skin/Wound Care: Routine pressure relief measures.  7. Fluids/Electrolytes/Nutrition: Monitor I/O. Check lytes in am. Nocturnal hydration.  8.  Essential hypertension: continue Norvasc and lopressor--avoid hypotension. Permissive hypertension to allow for perfusion. 9.  CAD: Continue aspirin, lopressor and statin.  Resume beta-blocker as blood pressures stabilized. Vitals:   01/07/18 2000 01/08/18 0445  BP: (!) 155/87 (!) 141/86  Pulse: 96 83  Resp: 16 18  Temp: 98 F (36.7 C) 99 F (37.2 C)  SpO2: 100% 98%  still in permissive range will start to tighten control in 2-3 d 10.  History of gout: On allopurinol 11.  Acute on chronic renal failure: Baseline serum creatinine at 1.4?  improved with hydration.  Will add IV fluids at nights due to dysphagia diet.until intake improved 12.  Anemia of chronic disease: Monitor for now 13. H/o peripheral edema: has 2-3+ edema at baseline. Will add TEDs.     LOS: 3 days A FACE TO FACE EVALUATION WAS PERFORMED  Charlett Blake 01/08/2018, 8:45 AM

## 2018-01-08 NOTE — Progress Notes (Signed)
Speech Language Pathology Daily Session Note  Patient Details  Name: [name] MRN: [id number] Date of Birth: 1941/02/01  Today's Date: 01/08/2018 SLP Individual Time: 0940-1010 SLP Individual Time Calculation (min): 30 min  Short Term Goals: Week 1: SLP Short Term Goal 1 (Week 1): Patient will consume current diet with minimal overt s/s of aspiration and supervision verbal cues for use of swallowing strategies.  SLP Short Term Goal 2 (Week 1): Patient will consume thin liquids via provale cup without overt s/s of aspiration with Min A verbal cues over 2 sessions prior to upgrade.  SLP Short Term Goal 3 (Week 1): Patient will demonstrate sustained attention to tasks for ~5 minutes with Min A verbal cues for redirection.  SLP Short Term Goal 4 (Week 1): Patient will attend to left field of enviornment/body during functional tasks with Mod A verbal cues.  SLP Short Term Goal 5 (Week 1): Patient will demonstrate basic problem solving for functional and familiar tasks with Mod A verbal cues.  SLP Short Term Goal 6 (Week 1): Patient will utilize speech intelligibility strategies at the phrase level to achieve ~75% intelligibility with Min A verbal cues.   Skilled Therapeutic Interventions:  Skilled treatment session focused on dysphagia and cognition goals. SLP met pt once on prior day but pt able to independently recall and recognize SLP. With supervision questions, pt able to recall need for small sips. Skilled observation provided of pt consuming thin water via cup sips with pt self-feeding. Pt able to take small cup sips with supervision cues. No overt s/s of aspiration. However when nursing was present in room (increased distraction) pt consumed several consecutive sips with no overt s/s of aspiration. Pt with cough when attempting to talk while swallowing. Pt left upright in bed, bed alarm on and nurse present.      Pain Pain Assessment Pain Scale: 0-10 Pain Score: 0-No  pain  Therapy/Group: Individual Therapy  Nichoel Digiulio 01/08/2018, 1:56 PM

## 2018-01-10 ENCOUNTER — Inpatient Hospital Stay (HOSPITAL_COMMUNITY): Payer: Medicare Other

## 2018-01-10 ENCOUNTER — Inpatient Hospital Stay (HOSPITAL_COMMUNITY): Payer: Medicare Other | Admitting: Speech Pathology

## 2018-01-10 LAB — URINALYSIS, ROUTINE W REFLEX MICROSCOPIC
Bilirubin Urine: NEGATIVE
Glucose, UA: NEGATIVE mg/dL
Hgb urine dipstick: NEGATIVE
Ketones, ur: NEGATIVE mg/dL
Nitrite: NEGATIVE
PH: 5 (ref 5.0–8.0)
Protein, ur: NEGATIVE mg/dL
SPECIFIC GRAVITY, URINE: 1.014 (ref 1.005–1.030)

## 2018-01-10 NOTE — Progress Notes (Signed)
Speech Language Pathology Daily Session Note  Patient Details  Name: GLENWOOD REVOIR MRN: [id number] Date of Birth: 1940-05-24  Today's Date: 01/10/2018 SLP Individual Time: 0825-0925 SLP Individual Time Calculation (min): 60 min  Short Term Goals: Week 1: SLP Short Term Goal 1 (Week 1): Patient will consume current diet with minimal overt s/s of aspiration and supervision verbal cues for use of swallowing strategies.  SLP Short Term Goal 2 (Week 1): Patient will consume thin liquids via provale cup without overt s/s of aspiration with Min A verbal cues over 2 sessions prior to upgrade.  SLP Short Term Goal 3 (Week 1): Patient will demonstrate sustained attention to tasks for ~5 minutes with Min A verbal cues for redirection.  SLP Short Term Goal 4 (Week 1): Patient will attend to left field of enviornment/body during functional tasks with Mod A verbal cues.  SLP Short Term Goal 5 (Week 1): Patient will demonstrate basic problem solving for functional and familiar tasks with Mod A verbal cues.  SLP Short Term Goal 6 (Week 1): Patient will utilize speech intelligibility strategies at the phrase level to achieve ~75% intelligibility with Min A verbal cues.   Skilled Therapeutic Interventions: Skilled treatment session focused on dysphagia and cognitive goals. SLP facilitated session by providing Min-Mod A verbal cues for use of swallowing compensatory strategies in regards to small bites/sips, a slow rate of self-feeding and to self-monitor and correct wet vocal quality during breakfast meal of Dys. 1 textures with thin liquids via the provale cup. Patient without overt coughing but did demonstrate an intermittent wet vocal quality that cleared with a cued throat clear. Suspect wet vocal quality is due to talking with a full oral cavity requiring Mod-Max A verbal cues for sustained attention to self-feeding and awareness of bolus. At end of session, oral care was provided and a moderate amount of Dys. 1  textures were removed from the oral cavity. Recommend patient continue Dys. 1 textures but upgrade to thin liquids via the provale cup. SLP attempted to donn patient's dentures, however, patient's top plate is too loose and causing more difficulty with speech intelligibility and impacting swallowing safety, therefore, dentures were removed. Patient left upright in bed with alarm on and all needs within reach. Continue with current plan of care.      Pain No/Denies Pain   Therapy/Group: Individual Therapy  Linzy Darling 01/10/2018, 12:34 PM

## 2018-01-10 NOTE — Progress Notes (Signed)
Physical Therapy Session Note  Patient Details  Name: [name] MRN: [id number] Date of Birth: 04/04/1940  Today's Date: 01/10/2018 PT Individual Time: 1100-1158 PT Individual Time Calculation (min): 58 min   Short Term Goals: Week 1:  PT Short Term Goal 1 (Week 1): Pt will initiate stair training PT Short Term Goal 2 (Week 1): Pt will ambulate 50' w/ LRAD w/ min assist PT Short Term Goal 3 (Week 1): Pt will participate in 60 min of upright activity w/o increase in fatigue PT Short Term Goal 4 (Week 1): Pt will attend to L environment/body w/ min cues 50% of the time  Skilled Therapeutic Interventions/Progress Updates:    Pt seated in w/c upon PT arrival, agreeable to therapy tx and denies pain. Pt transported to the gym. Pt ambulated x 50 ft with RW and min assist, verbal cues for increased L step length and attention to L side. Pt performed x10 sit<>stands without UE support for LE strengthening, emphasis on symmetric weightbearing through LEs with manual facilitation for increased weightbearing through L LE. Pt performed 2 x 10 sit<>stands without UE support on R LE on dynadisc for increased weightbearing through L LE. Pt worked on dynamic standing balance without UE support, R LE on dynadisc while reaching overhead to the L for horseshoes to toss. Pt performed L LE neuro re-ed exercises in standing with RW, 2 x 10 hamstring curls and 2 x 10 hip flexion. Pt seated edge of mat performed x 10 LAQ with x 5 sec hold in full extension for neuro re-ed. Pt performed standing mini squats with mirror for visual feedback 2 x 15 with emphasis on maintaining midline, symmetric weightbearing through LEs and L knee control. Pt transferred back to w/c stand pivot with RW and min assist, transported back to room.   Therapy Documentation Precautions:  Precautions Precautions: Fall Restrictions Weight Bearing Restrictions: No    Therapy/Group: Individual Therapy  Netta Corrigan, PT,  DPT 01/10/2018, 7:57 AM

## 2018-01-10 NOTE — Progress Notes (Signed)
[place] PHYSICAL MEDICINE & REHABILITATION PROGRESS NOTE   Subjective/Complaints:  Pt working with SLP, on pureed diet due to edentulous status ROS- neg CP SOB, N/V/D Objective:   No results found. No results for input(s): WBC, HGB, HCT, PLT in the last 72 hours. No results for input(s): NA, K, CL, CO2, GLUCOSE, BUN, CREATININE, CALCIUM in the last 72 hours.  Intake/Output Summary (Last 24 hours) at 01/10/2018 0841 Last data filed at 01/10/2018 0533 Gross per 24 hour  Intake 380 ml  Output 1000 ml  Net -620 ml     Physical Exam: Vital Signs Blood pressure (!) 150/89, pulse 66, temperature (!) 100.4 F (38 C), temperature source Oral, resp. rate 18, height 6\' 2"  (1.88 m), weight 120.4 kg, SpO2 99 %.     Assessment/Plan: 1. Functional deficits secondary to Right MCA infarct with Left hemiparesis, dysarthria and Dysphagia which require 3+ hours per day of interdisciplinary therapy in a comprehensive inpatient rehab setting.  Physiatrist is providing close team supervision and 24 hour management of active medical problems listed below.  Physiatrist and rehab team continue to assess barriers to discharge/monitor patient progress toward functional and medical goals  Care Tool:  Bathing    Body parts bathed by patient: Left arm, Chest, Abdomen, Right upper leg, Left upper leg, Right lower leg   Body parts bathed by helper: Right arm, Buttocks, Left lower leg, Front perineal area     Bathing assist Assist Level: Moderate Assistance - Patient 50 - 74%     Upper Body Dressing/Undressing Upper body dressing   What is the patient wearing?: Pull over shirt    Upper body assist Assist Level: Total Assistance - Patient < 25%    Lower Body Dressing/Undressing Lower body dressing      What is the patient wearing?: Pants     Lower body assist Assist for lower body dressing: Maximal Assistance - Patient 25 - 49%     Toileting Toileting    Toileting assist Assist  for toileting: Moderate Assistance - Patient 50 - 74%     Transfers Chair/bed transfer  Transfers assist     Chair/bed transfer assist level: Dependent - mechanical lift     Locomotion Ambulation   Ambulation assist      Assist level: Minimal Assistance - Patient > 75% Assistive device: Walker-rolling Max distance: 50'   Walk 10 feet activity   Assist     Assist level: Maximal Assistance - Patient 25 - 49% Assistive device: Walker-rolling   Walk 50 feet activity   Assist Walk 50 feet with 2 turns activity did not occur: Safety/medical concerns  Assist level: Minimal Assistance - Patient > 75% Assistive device: Walker-rolling    Walk 150 feet activity   Assist Walk 150 feet activity did not occur: Safety/medical concerns         Walk 10 feet on uneven surface  activity   Assist Walk 10 feet on uneven surfaces activity did not occur: Safety/medical concerns         Wheelchair     Assist Will patient use wheelchair at discharge?: (TBD) Type of Wheelchair: Manual    Wheelchair assist level: Minimal Assistance - Patient > 75% Max wheelchair distance: 100'    Wheelchair 50 feet with 2 turns activity    Assist        Assist Level: Minimal Assistance - Patient > 75%   Wheelchair 150 feet activity     Assist     Assist Level: Minimal  Assistance - Patient > 75%     Medical Problem List and Plan: 1.  Deficits with mobility, transfers, self-care, speech, swallowing secondary to multiple right infarcts acute sub acute in MCA as well as MCA/ACA watershed distribution and remote hemorrhagic infarct right thalamic.   CIR PT, OT,  SLP 2.  DVT Prophylaxis/Anticoagulation: Pharmaceutical: Lovenox 3. Neck/shoulder pain/Pain Management: Muscle strain due to left inattention. Will add K pad in addition to Sportscreme for local measures.  4. Mood: LCSW to follow for evaluation and support.  5. Neuropsych: This patient is not fully  capable of making decisions on his own behalf. 6. Skin/Wound Care: Routine pressure relief measures.  7. Fluids/Electrolytes/Nutrition: Monitor I/O. Check lytes in am. Nocturnal hydration.  8.  Essential hypertension: continue Norvasc and lopressor--avoid hypotension. Permissive hypertension to allow for perfusion. 9.  CAD: Continue aspirin, lopressor and statin.  Resume beta-blocker as blood pressures stabilized. Vitals:   01/09/18 2150 01/10/18 0515  BP: (!) 146/87 (!) 150/89  Pulse: 78 66  Resp: 18 18  Temp: 99.4 F (37.4 C) (!) 100.4 F (38 C)  SpO2: 95% 99%  still in permissive range will start to tighten control in 2 d 10.  History of gout: On allopurinol 11.   chronic renal failure: Baseline serum creatinine at 1.4?  improved with hydration.  12.  Anemia of chronic disease: Monitor for now 13. H/o peripheral edema: has 2-3+ edema at baseline. Will add TEDs.   14.  Fever aymptomatic, intermittent urinary incont   LOS: 5 days A FACE TO FACE EVALUATION WAS PERFORMED  Charlett Blake 01/10/2018, 8:41 AM

## 2018-01-10 NOTE — Progress Notes (Signed)
Occupational Therapy Session Note  Patient Details  Name: [name] MRN: [id number] Date of Birth: 1940-12-23  Today's Date: 01/10/2018 OT Individual Time: 0930-1100 OT Individual Time Calculation (min): 90 min    Short Term Goals: Week 1:  OT Short Term Goal 1 (Week 1): Pt will be able to wash UB with min A. OT Short Term Goal 1 - Progress (Week 1): Other (comment) OT Short Term Goal 2 (Week 1): Pt will be able to wash LB with mod A. OT Short Term Goal 3 (Week 1): Pt will able to don shirt with mod A. OT Short Term Goal 4 (Week 1): Pt will be able to don pant with max A. OT Short Term Goal 5 (Week 1): Pt will be able to transfer to [facility] with mod A.  Skilled Therapeutic Interventions/Progress Updates:    OT intervention with focus on bed mobility, sit<>stand, standing balance, BADL retraining, activity tolerance, and safety awareness to increase independence with BADLs. Pt required mod A for supine>sit EOB in preparation for tranfser to shower.  Pt transferred to shower with Stedy and completed bathing tasks while seated.  Pt completed dressing tasks with sit<>stand from w/c at sink.  See ADL section below.  Pt required mod A for weight shifts when standing.  1:1 NMES applied to L wrist and finger flexors to stimulate active wrist and finger flexion Ratio 1:3 Rate 35 pps Waveform- Asymmetric Ramp 1.0 Pulse 300 Intensity- 21 Duration - 10 mins    Wrist and finger flexion with estim but unable to activate independently when removed.  No adverse reactions after treatment and is skin intact.   Pt remained seated in w/c with half lap tray in place and all needs within reach.    Therapy Documentation Precautions:  Precautions Precautions: Fall Restrictions Weight Bearing Restrictions: No Pain:  Pt c/o L shoulder discomfort; repositioned and kinesio tape applied ADL: ADL Eating: Set up Grooming: Minimal assistance Where Assessed-Grooming: Sitting at sink Upper Body  Bathing: Moderate assistance, Minimal assistance Where Assessed-Upper Body Bathing: Shower Lower Body Bathing: Moderate assistance Where Assessed-Lower Body Bathing: Shower, Standing at sink Upper Body Dressing: Moderate assistance Where Assessed-Upper Body Dressing: Sitting at sink Lower Body Dressing: Maximal assistance Where Assessed-Lower Body Dressing: Standing at sink, Sitting at sink, Wheelchair Toileting: Not assessed Toilet Transfer: Not assessed Social research officer, government: Not assessed Social research officer, government Method: Other (comment)(mecahnical lift) Youth worker: Radio broadcast assistant   Therapy/Group: Individual Therapy  Leroy Libman 01/10/2018, 11:59 AM

## 2018-01-11 ENCOUNTER — Inpatient Hospital Stay (HOSPITAL_COMMUNITY): Payer: Self-pay

## 2018-01-11 ENCOUNTER — Inpatient Hospital Stay (HOSPITAL_COMMUNITY): Payer: Medicare Other

## 2018-01-11 ENCOUNTER — Inpatient Hospital Stay (HOSPITAL_COMMUNITY): Payer: Medicare Other | Admitting: Speech Pathology

## 2018-01-11 LAB — URINE CULTURE: Culture: NO GROWTH

## 2018-01-11 MED ORDER — SORBITOL 70 % SOLN
45.0000 mL | Freq: Once | Status: AC
  Administered 2018-01-11: 45 mL via ORAL
  Filled 2018-01-11: qty 60

## 2018-01-11 NOTE — Progress Notes (Signed)
[place] PHYSICAL MEDICINE & REHABILITATION PROGRESS NOTE   Subjective/Complaints:  Discussed BP management, no new issues , received suppository with good results  ROS- neg CP SOB, N/V/D Objective:   No results found. No results for input(s): WBC, HGB, HCT, PLT in the last 72 hours. No results for input(s): NA, K, CL, CO2, GLUCOSE, BUN, CREATININE, CALCIUM in the last 72 hours.  Intake/Output Summary (Last 24 hours) at 01/11/2018 0825 Last data filed at 01/11/2018 0432 Gross per 24 hour  Intake 2063.14 ml  Output 1025 ml  Net 1038.14 ml     Physical Exam: Vital Signs Blood pressure (!) 141/77, pulse 64, temperature 98.3 F (36.8 C), temperature source Oral, resp. rate 18, height 6\' 2"  (1.88 m), weight 120.4 kg, SpO2 97 %.     Assessment/Plan: 1. Functional deficits secondary to Right MCA infarct with Left hemiparesis, dysarthria and Dysphagia which require 3+ hours per day of interdisciplinary therapy in a comprehensive inpatient rehab setting.  Physiatrist is providing close team supervision and 24 hour management of active medical problems listed below.  Physiatrist and rehab team continue to assess barriers to discharge/monitor patient progress toward functional and medical goals  Care Tool:  Bathing    Body parts bathed by patient: Left arm, Chest, Abdomen, Front perineal area, Right upper leg, Left upper leg, Right lower leg, Face   Body parts bathed by helper: Buttocks, Right arm, Left lower leg     Bathing assist Assist Level: Moderate Assistance - Patient 50 - 74%     Upper Body Dressing/Undressing Upper body dressing   What is the patient wearing?: Pull over shirt    Upper body assist Assist Level: Moderate Assistance - Patient 50 - 74%    Lower Body Dressing/Undressing Lower body dressing      What is the patient wearing?: Pants     Lower body assist Assist for lower body dressing: Maximal Assistance - Patient 25 - 49%      Toileting Toileting    Toileting assist Assist for toileting: Moderate Assistance - Patient 50 - 74%     Transfers Chair/bed transfer  Transfers assist     Chair/bed transfer assist level: Minimal Assistance - Patient > 75%     Locomotion Ambulation   Ambulation assist      Assist level: Minimal Assistance - Patient > 75% Assistive device: Walker-rolling Max distance: 50'   Walk 10 feet activity   Assist     Assist level: Minimal Assistance - Patient > 75% Assistive device: Walker-rolling   Walk 50 feet activity   Assist Walk 50 feet with 2 turns activity did not occur: Safety/medical concerns  Assist level: Minimal Assistance - Patient > 75% Assistive device: Walker-rolling    Walk 150 feet activity   Assist Walk 150 feet activity did not occur: Safety/medical concerns         Walk 10 feet on uneven surface  activity   Assist Walk 10 feet on uneven surfaces activity did not occur: Safety/medical concerns         Wheelchair     Assist Will patient use wheelchair at discharge?: (TBD) Type of Wheelchair: Manual    Wheelchair assist level: Minimal Assistance - Patient > 75% Max wheelchair distance: 100'    Wheelchair 50 feet with 2 turns activity    Assist        Assist Level: Minimal Assistance - Patient > 75%   Wheelchair 150 feet activity     Assist  Assist Level: Minimal Assistance - Patient > 75%     Medical Problem List and Plan: 1.  Deficits with mobility, transfers, self-care, speech, swallowing secondary to multiple right infarcts acute sub acute in MCA as well as MCA/ACA watershed distribution and remote hemorrhagic infarct right thalamic.   CIR PT, OT,  SLP- team conf in am 2.  DVT Prophylaxis/Anticoagulation: Pharmaceutical: Lovenox 3. Neck/shoulder pain/Pain Management: Muscle strain due to left inattention. Will add K pad in addition to Sportscreme for local measures.  4. Mood: LCSW to follow  for evaluation and support.  5. Neuropsych: This patient is not fully capable of making decisions on his own behalf. 6. Skin/Wound Care: Routine pressure relief measures.  7. Fluids/Electrolytes/Nutrition: Monitor I/O. Check lytes in am. Nocturnal hydration.  8.  Essential hypertension: continue Norvasc and lopressor--avoid hypotension. Permissive hypertension to allow for perfusion. 9.  CAD: Continue aspirin, lopressor and statin.  Resume beta-blocker as blood pressures stabilized. Vitals:   01/10/18 2027 01/11/18 0432  BP: (!) 152/78 (!) 141/77  Pulse: 64 64  Resp: 18 18  Temp: 98.7 F (37.1 C) 98.3 F (36.8 C)  SpO2: 96% 97%  still in permissive range will start to tighten control- increase amlodipine to 10mg  qd 10.  History of gout: On allopurinol 11.   chronic renal failure: Baseline serum creatinine at 1.4?  improved with hydration.  12.  Anemia of chronic disease: Monitor for now 13. H/o peripheral edema: has 2-3+ edema at baseline. Will add TEDs.   14.  Fever aymptomatic, intermittent urinary incont , resolved, UA equivocal  6-10 WBC, rare bact  LOS: 6 days A FACE TO FACE EVALUATION WAS PERFORMED  Charlett Blake 01/11/2018, 8:25 AM

## 2018-01-11 NOTE — Progress Notes (Signed)
Speech Language Pathology Daily Session Note  Patient Details  Name: [name] MRN: [id number] Date of Birth: 07/24/1940  Today's Date: 01/11/2018 SLP Individual Time: 1035-1130 SLP Individual Time Calculation (min): 55 min  Short Term Goals: Week 1: SLP Short Term Goal 1 (Week 1): Patient will consume current diet with minimal overt s/s of aspiration and supervision verbal cues for use of swallowing strategies.  SLP Short Term Goal 2 (Week 1): Patient will consume thin liquids via provale cup without overt s/s of aspiration with Min A verbal cues over 2 sessions prior to upgrade.  SLP Short Term Goal 3 (Week 1): Patient will demonstrate sustained attention to tasks for ~5 minutes with Min A verbal cues for redirection.  SLP Short Term Goal 4 (Week 1): Patient will attend to left field of enviornment/body during functional tasks with Mod A verbal cues.  SLP Short Term Goal 5 (Week 1): Patient will demonstrate basic problem solving for functional and familiar tasks with Mod A verbal cues.  SLP Short Term Goal 6 (Week 1): Patient will utilize speech intelligibility strategies at the phrase level to achieve ~75% intelligibility with Min A verbal cues.   Skilled Therapeutic Interventions: Skilled treatment session focused on cognitive goals. Upon arrival, patient was upright in wheelchair and requested to use the commode. SLP facilitated session by utilizing the Stedy to transfer to the commode with Min A verbal cues needed for problem solving with task. Patient able to have a successful bowel movement. SLP also facilitated session by providing Min A verbal cues for functional problem solving during a 4 and 6 step picture sequencing task with Min A verbal cues needed for use of speech intelligibility strategies at the phrase and sentence level. Patient left upright in wheelchair with alarm on and all needs within reach. Continue with current plan of care.      Pain Pain Assessment Pain Scale:  0-10 Pain Score: 0-No pain  Therapy/Group: Individual Therapy  [name] 01/11/2018, 2:59 PM

## 2018-01-11 NOTE — Progress Notes (Signed)
Physical Therapy Session Note  Patient Details  Name: [name] MRN: [id number] Date of Birth: Sep 14, 1940  Today's Date: 01/11/2018 PT Individual Time: 1302-1400 PT Individual Time Calculation (min): 58 min   Short Term Goals: Week 1:  PT Short Term Goal 1 (Week 1): Pt will initiate stair training PT Short Term Goal 2 (Week 1): Pt will ambulate 50' w/ LRAD w/ min assist PT Short Term Goal 3 (Week 1): Pt will participate in 60 min of upright activity w/o increase in fatigue PT Short Term Goal 4 (Week 1): Pt will attend to L environment/body w/ min cues 50% of the time  Skilled Therapeutic Interventions/Progress Updates:    PT seated in w/c upon PT arrival, agreeable to therapy tx and denies pain. Pt slouched in chair, cues to scoot hips back in chair with min assist. Therapist donned shoes and socks this session with total assist for time management. Pt transported to the gym total assist. Pt ambulated 2x 50 ft this session with min assist overall and RW, verbal cues for attention to L side and increased L step length, worked on independence with set up and use of L hand orthosis on RW, pt requiring mod assist with turns secondary to decreased awareness and attention. Pt talking with recreation therapist during therapeutic rest break while PT added cushion to w/c and adjusted leg rest length for better positioning in w/c and comfort. Pt worked on dynamic standing balance this session without UE support while reaching outside BOS on both sides for horseshoes to toss. Pt worked on dynamic standing balance and L LE weightbearing, knee extension while standing with airex under R foot and reaching overhead to the R for horseshoes, min assist. Pt reports having to go to the bathroom, transported back to room. Pt ambulated from w/c>toilet with RW and min assist x 8 ft, verbal cues for L attention. Pt performs clothing management with min assist to maintain standing balance. Pt ambulated to w/c with mod  assist and RW, washed hands at the sink while seated in w/c and then performed min assist stand pivot to bed. Sit>supine with min assist and left supine with needs in reach and bed alarm set.   Therapy Documentation Precautions:  Precautions Precautions: Fall Restrictions Weight Bearing Restrictions: No    Therapy/Group: Individual Therapy  Netta Corrigan, PT, DPT 01/11/2018, 7:59 AM

## 2018-01-11 NOTE — Progress Notes (Signed)
Occupational Therapy Session Note  Patient Details  Name: MARTINE TRAGESER MRN: [id number] Date of Birth: [birth year]  Today's Date: 01/11/2018 OT Individual Time: 3154-0086 OT Individual Time Calculation (min): 75 min    Short Term Goals: Week 1:  OT Short Term Goal 1 (Week 1): Pt will be able to wash UB with min A. OT Short Term Goal 1 - Progress (Week 1): Other (comment) OT Short Term Goal 2 (Week 1): Pt will be able to wash LB with mod A. OT Short Term Goal 3 (Week 1): Pt will able to don shirt with mod A. OT Short Term Goal 4 (Week 1): Pt will be able to don pant with max A. OT Short Term Goal 5 (Week 1): Pt will be able to transfer to [facility] with mod A.  Skilled Therapeutic Interventions/Progress Updates:    Pt resting in bed upon arrival and stated he was on bedpan trying to have a BM.  Therapist switched regular BSC for bariatric BSC and informed nursing staff that pt should transfer to [facility] with [facility] for future BMs.  Safety plan changed.  OT intervention with focus on BADL retraining including bathing/dressing with sit<>stand from w/c at sink, sitting balance, standing balance, sit<>stand, activity tolerance, and safety awareness to increase independence with BADLs. .  Pt required mod A for supine>sit EOB and performed squat/scoot tranfser to w/c with mod A.  Pt requires max verbal cues for sequencing and safety awareness during transfers.  Pt engaged in bathing/dressing at sink (see below for assist levels). Pt requires mod verbal cues for sequencing during dressing activities.  Pt does not initiate use of LUE during funcitonal tasks.  HOH assistance provided to engage LUE in bathing and dressing tasks.  Pt requires min A for sit<>stand at sink and min A for standing balance with mod verbal cues for weight shifts. Pt remained in w/c with NT present to assist with breakfast.   Therapy Documentation Precautions:  Precautions Precautions: Fall Restrictions Weight Bearing Restrictions:  No  Pain:  Pt denies pain ADL: ADL Eating: Set up Grooming: Minimal assistance Where Assessed-Grooming: Sitting at sink, Wheelchair Upper Body Bathing: Minimal assistance Where Assessed-Upper Body Bathing: Sitting at sink, Wheelchair Lower Body Bathing: Moderate assistance Where Assessed-Lower Body Bathing: Standing at sink, Wheelchair Upper Body Dressing: Moderate assistance Where Assessed-Upper Body Dressing: Sitting at sink, Wheelchair Lower Body Dressing: Maximal assistance Where Assessed-Lower Body Dressing: Standing at sink, Wheelchair   Therapy/Group: Individual Therapy  Leroy Libman 01/11/2018, 11:09 AM

## 2018-01-11 NOTE — Evaluation (Signed)
Recreational Therapy Assessment and Plan  Patient Details  Name: [name] MRN: [id number] Date of Birth: 1940/08/04 Today's Date: 01/11/2018 Time:  1321-1350 Pain:  No c/o Rehab Potential: Good ELOS: 2.5 weeks  Assessment  Problem List:      Patient Active Problem List   Diagnosis Date Noted  . 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 History of gout   . AKI (acute kidney injury) ([place])   . Stage 3 chronic kidney disease ([place])   . Anemia of chronic disease   . Left-sided weakness   . Osteoarthritis   . OSA (obstructive sleep apnea)   . Noncompliance with CPAP treatment   . History of DVT (deep vein thrombosis)   . History of CVA (cerebrovascular accident)   . Dysphagia, post-stroke   . Sinus tachycardia   . Acute blood loss anemia   . Acute CVA (cerebrovascular accident) ([place]) 01/01/2018  . Hypertensive urgency 01/01/2018  . CKD (chronic kidney disease) stage 2, GFR 60-89 ml/min 01/01/2018  . Generalized weakness   . Sepsis ([place]) 12/10/2016  . Bacteremia due to Streptococcus 12/10/2016  . Lactic acidosis 12/10/2016  . Elevated troponin 12/10/2016  . Renal insufficiency 12/10/2016  . Hypophosphatemia 12/10/2016  . Left leg swelling 12/10/2016  . Febrile illness 12/09/2016  . Febrile illness, acute   . OBESITY, UNSPECIFIED 05/31/2009  . Hyperlipidemia 05/30/2009  . Obstructive sleep apnea 05/30/2009  . Essential hypertension 05/30/2009  . CAD (coronary artery disease) 05/30/2009    Past Medical History:      Past Medical History:  Diagnosis Date  . CAD (coronary artery disease)    s/p cath in 2005 and CABG x4  . Cataract   . DJD (degenerative joint disease)   . DJD (degenerative joint disease)   . Fluid retention    mild  . H/O: gout   . History of blood clots   . HTN (hypertension)   . Hyperlipidemia   . Hyperlipidemia   . Myocardial infarction ([place]) 2009  . Obesity    with reduction  . OSA (obstructive sleep apnea)     AHI 98/hr  . Renal insufficiency   . Sleep apnea   . Stroke [facility]) 2000   Past Surgical History:       Past Surgical History:  Procedure Laterality Date  . CORONARY ARTERY BYPASS GRAFT  2005   LIMA to LAD, SVG to OM1 and OM 2, RCA.   [name] left inguinal hernia repair    . LOOP RECORDER INSERTION N/A 01/03/2018   Procedure: LOOP RECORDER INSERTION;  Surgeon: Evans Lance, MD;  Location: [place] CV LAB;  Service: Cardiovascular;  Laterality: N/A;  . TEE WITHOUT CARDIOVERSION N/A 12/11/2016   Procedure: TRANSESOPHAGEAL ECHOCARDIOGRAM (TEE);  Surgeon: [name], MD;  Location: [facility] ENDOSCOPY;  Service: Cardiovascular;  Laterality: N/A;  . TEE WITHOUT CARDIOVERSION N/A 01/03/2018   Procedure: TRANSESOPHAGEAL ECHOCARDIOGRAM (TEE);  Surgeon: Dorothy Spark, MD;  Location: [facility] ENDOSCOPY;  Service: Cardiovascular;  Laterality: N/A;    Assessment & Plan Clinical Impression: [name] is a 77 year old male with history of CAD, DJD, OSA--noncompliant with CPAP due to frequency, history of DVTs, CVA; who was admitted on 01/01/2018 with acute onset of left facial weakness, dysarthria, left-sided weakness and fall. History taken from chart review, family, and patient. CT perfusion done revealing small acute right frontal/MCA penumbra and CTA head/neck showed moderate cerebral artery atherosclerosis with mid stenosis right-to be origin. MRI brain reviewed, showing multiple right CVA. Showed multiple right  infarcts and remote hemorrhagic infarct right thalamic. Patient had issues with lethargy with difficulty handling secretions and MBS done 11/3 with initiation of dysphagia 1 and nectar liquids as well as strict aspiration precautions. 2D echo done revealing EF of 60 to 65% with no wall abnormality and aortic sclerosis without stenosis. Bilateral lower extreme Dopplers were negative for DVT. Carotid Dopplers showed large soft plaque in the right ICA bifurcation however no  significant stenosis. He was started on aspirin and Plavix for embolic stroke felt to be from right ICA soft plaque versus cardioembolic source. TEE done for work-up and showed no evidence of thrombus and negative bubble study. Loop recorder placed by Dr. Lovena Le.  Patient transferred to CIR on 01/05/2018 .    Pt presents with decreased activity tolerance, decreased functional mobility, decreased balance, decreased coordination, decreased vision, left inattention, decreased awareness, decreased problem solving and decreased memory Limiting pt's independence with leisure/community pursuits.  Plan Min 1 TR session >20 minutes during LOS Recommendations for other services: None   Discharge Criteria: Patient will be discharged from TR if patient refuses treatment 3 consecutive times without medical reason.  If treatment goals not met, if there is a change in medical status, if patient makes no progress towards goals or if patient is discharged from hospital.  The above assessment, treatment plan, treatment alternatives and goals were discussed and mutually agreed upon: by patient  [place] 01/11/2018, 4:00 PM

## 2018-01-11 NOTE — Progress Notes (Signed)
Pt has had some blood in the urine, and small amount on the brief. Pt denies pain or burning sensation with urination. No frequency or urgency noted. Will notify the MD in am. Will continue to monitor.

## 2018-01-12 ENCOUNTER — Inpatient Hospital Stay (HOSPITAL_COMMUNITY): Payer: Self-pay

## 2018-01-12 ENCOUNTER — Inpatient Hospital Stay (HOSPITAL_COMMUNITY): Payer: Medicare Other

## 2018-01-12 ENCOUNTER — Inpatient Hospital Stay (HOSPITAL_COMMUNITY): Payer: Medicare Other | Admitting: Speech Pathology

## 2018-01-12 ENCOUNTER — Ambulatory Visit (HOSPITAL_COMMUNITY): Payer: Self-pay | Admitting: *Deleted

## 2018-01-12 DIAGNOSIS — D638 Anemia in other chronic diseases classified elsewhere: Secondary | ICD-10-CM

## 2018-01-12 DIAGNOSIS — N183 Chronic kidney disease, stage 3 unspecified: Secondary | ICD-10-CM

## 2018-01-12 DIAGNOSIS — R609 Edema, unspecified: Secondary | ICD-10-CM

## 2018-01-12 DIAGNOSIS — I63511 Cerebral infarction due to unspecified occlusion or stenosis of right middle cerebral artery: Secondary | ICD-10-CM

## 2018-01-12 DIAGNOSIS — R600 Localized edema: Secondary | ICD-10-CM

## 2018-01-12 LAB — BASIC METABOLIC PANEL
Anion gap: 5 (ref 5–15)
BUN: 15 mg/dL (ref 8–23)
CHLORIDE: 110 mmol/L (ref 98–111)
CO2: 24 mmol/L (ref 22–32)
Calcium: 9.7 mg/dL (ref 8.9–10.3)
Creatinine, Ser: 1.25 mg/dL — ABNORMAL HIGH (ref 0.61–1.24)
GFR, EST NON AFRICAN AMERICAN: 55 mL/min — AB (ref >60)
Glucose, Bld: 100 mg/dL — ABNORMAL HIGH (ref 70–99)
Potassium: 4.2 mmol/L (ref 3.5–5.1)
SODIUM: 139 mmol/L (ref 135–145)

## 2018-01-12 LAB — CBC
HCT: 36.9 % — ABNORMAL LOW (ref 39.0–52.0)
Hemoglobin: 11.5 g/dL — ABNORMAL LOW (ref 13.0–17.0)
MCH: 26.7 pg (ref 26.0–34.0)
MCHC: 31.2 g/dL (ref 30.0–36.0)
MCV: 85.8 fL (ref 80.0–100.0)
NRBC: 0 % (ref 0.0–0.2)
PLATELETS: 242 10*3/uL (ref 150–400)
RBC: 4.3 MIL/uL (ref 4.22–5.81)
RDW: 16 % — ABNORMAL HIGH (ref 11.5–15.5)
WBC: 7.8 10*3/uL (ref 4.0–10.5)

## 2018-01-12 NOTE — Patient Care Conference (Signed)
Inpatient RehabilitationTeam Conference and Plan of Care Update Date: 01/12/2018   Time: 11:20 AM    Patient Name: OSMANY AZER      Medical Record Number: [id number]  Date of Birth: 1940-10-01 Sex: Male         Room/Bed: 4W21C/4W21C-01 Payor Info: Payor: [employer] MEDICARE / Plan: [facility] MEDICARE / Product Type: *No Product type* /    Admitting Diagnosis: R CVA  Admit Date/Time:  01/05/2018  2:45 PM Admission Comments: No comment available   Primary Diagnosis:  <principal problem not specified> Principal Problem: <principal problem not specified>  Patient Active Problem List   Diagnosis Date Noted  . 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 CKD (chronic kidney disease), stage III ([place])   . Peripheral edema   . 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 History of gout   . AKI (acute kidney injury) ([place])   . Stage 3 chronic kidney disease ([place])   . Anemia of chronic disease   . Left-sided weakness   . Osteoarthritis   . OSA (obstructive sleep apnea)   . Noncompliance with CPAP treatment   . History of DVT (deep vein thrombosis)   . History of CVA (cerebrovascular accident)   . Dysphagia, post-stroke   . Sinus tachycardia   . Acute blood loss anemia   . Acute CVA (cerebrovascular accident) ([place]) 01/01/2018  . Hypertensive urgency 01/01/2018  . CKD (chronic kidney disease) stage 2, GFR 60-89 ml/min 01/01/2018  . Generalized weakness   . Sepsis ([place]) 12/10/2016  . Bacteremia due to Streptococcus 12/10/2016  . Lactic acidosis 12/10/2016  . Elevated troponin 12/10/2016  . Renal insufficiency 12/10/2016  . Hypophosphatemia 12/10/2016  . Left leg swelling 12/10/2016  . Febrile illness 12/09/2016  . Febrile illness, acute   . OBESITY, UNSPECIFIED 05/31/2009  . Hyperlipidemia 05/30/2009  . Obstructive sleep apnea 05/30/2009  . Essential hypertension 05/30/2009  . CAD (coronary artery disease) 05/30/2009     Expected Discharge Date: Expected Discharge Date: 01/22/18  Team Members Present: Physician leading conference: Dr. Delice Lesch Social Worker Present: Ovidio Kin, LCSW Nurse Present: Rayetta Pigg, RN PT Present: Michaelene Song, PT OT Present: Willeen Cass, OT;Roanna Epley, COTA SLP Present: Weston Anna, SLP PPS Coordinator present : Daiva Nakayama, RN, CRRN     Current Status/Progress Goal Weekly Team Focus  Medical   Deficits with mobility, transfers, self-care, speech, swallowing secondary to multiple right infarcts acute sub acute in MCA as well as MCA/ACA watershed distribution and remote hemorrhagic infarct right thalamic.    Improve mobility, safety, BP, fevers, follow labs  See above   Bowel/Bladder   Pt is incontinent of the bladder and continent of the bowel. LBM 01/11/2018  Maintain regular bowel pattern. Toilet pt prior to bed HS   Assist with toileting needs PRN   Swallow/Nutrition/ Hydration   Dys. 1 textures with thin liquids via provale cup, Min-Mod A for use of strategies   Supervision  trials of upgraded textures, trials of liquids via cup    ADL's   bathing-mod A, UB dressing-mod A, LB dressing-max A, functional transfers-min A, min verbal cues to attend to L, max A for LUE use as stabilizer  supervision overall  LUE NMR, BADL retraining, functional transfers, dsafety awareness, standing balance   Mobility   min-mod assist gait 50-60 ft with RW, min assist sit<>stand and stand pivot transfer, supervision-min assist bed mobility  supervision-CGA  gait, L attention, L NMR, balance,  awareness, transfers   Communication   Min A   Supervision   use of speech intelligibility strategies    Safety/Cognition/ Behavioral Observations  Mod A   Superivsion  attention to left, selective attention, problem solving, recall    Pain   Pt has no complaints of pain  Pain <3  Assess pain Q shift and PRN   Skin   Pt has no skin issues  Maintain skin integrity and prevent  skin breakdown   Assess skin Q shift and PRN      *See Care Plan and progress notes for long and short-term goals.     Barriers to Discharge  Current Status/Progress Possible Resolutions Date Resolved   Physician    Medical stability;Inaccessible home environment     See above  Therapies, optimize BP, follow labs, follow temp.      Nursing                  PT                    OT                  SLP                SW                Discharge Planning/Teaching Needs:  HOme with wife who can assist up to min assist, recently finished her rad tx. Daughter's and son are involved and supportive.      Team Discussion:  Goals supervision-CGA level. Making good progress in therapies, very motivated and pushes himself. L-inattention working on along with balance and awareness. MD adjusting BP meds. CKD stable. Upgraded diet to thin Dys 1.  Revisions to Treatment Plan:  DC 11/23    Continued Need for Acute Rehabilitation Level of Care: The patient requires daily medical management by a physician with specialized training in physical medicine and rehabilitation for the following conditions: Daily direction of a multidisciplinary physical rehabilitation program to ensure safe treatment while eliciting the highest outcome that is of practical value to the patient.: Yes Daily medical management of patient stability for increased activity during participation in an intensive rehabilitation regime.: Yes Daily analysis of laboratory values and/or radiology reports with any subsequent need for medication adjustment of medical intervention for : Blood pressure problems;Renal problems;Other;Neurological problems   I attest that I was present, lead the team conference, and concur with the assessment and plan of the team.   Elease Hashimoto 01/12/2018, 1:51 PM

## 2018-01-12 NOTE — Progress Notes (Signed)
Occupational Therapy Session Note  Patient Details  Name: [name] MRN: [id number] Date of Birth: December 19, 1940  Today's Date: 01/12/2018 OT Individual Time: 0730-0900 OT Individual Time Calculation (min): 90 min    Short Term Goals: Week 1:  OT Short Term Goal 1 (Week 1): Pt will be able to wash UB with min A. OT Short Term Goal 1 - Progress (Week 1): Other (comment) OT Short Term Goal 2 (Week 1): Pt will be able to wash LB with mod A. OT Short Term Goal 3 (Week 1): Pt will able to don shirt with mod A. OT Short Term Goal 4 (Week 1): Pt will be able to don pant with max A. OT Short Term Goal 5 (Week 1): Pt will be able to transfer to [facility] with mod A.  Skilled Therapeutic Interventions/Progress Updates:    OT intervention with focus on BADL retraining including bathing at shower level and dressing with sit<>stand from w/c at sink.  Pt required mod verbal cues for attending to his left during bathing tasks. Pt completed bathing and dressing tasks with mod A.  Pt performs sit<>stand with min A and min A for standing balance. Pt transitioned to gym for LUE NMr (see below). Pt with significant posterior pelvic tilt and manual facilitation provided to assist with sitting in neutral. Pt with increased L shoulder flexion/extension and adduction/adbutcion. Pt performed stand pivot transfers with min A. Pt returned to room and remained in w/c with NT present.   Therapy Documentation Precautions:  Precautions Precautions: Fall Restrictions Weight Bearing Restrictions: No Pain:  Pt denies pain   Other Treatments: Treatments Neuromuscular Facilitation: Left;Activity to increase motor control;Activity to increase coordination;Activity to increase timing and sequencing;Activity to increase anterior-posterior weight shifting;Activity to increase sustained activation   Therapy/Group: Individual Therapy  Leroy Libman 01/12/2018, 9:13 AM

## 2018-01-12 NOTE — Progress Notes (Signed)
Physical Therapy Session Note  Patient Details  Name: [name] MRN: [id number] Date of Birth: 21-Jun-1940  Today's Date: 01/12/2018 PT Individual Time: 1345-1430 PT Individual Time Calculation (min): 45 min   Short Term Goals: Week 1:  PT Short Term Goal 1 (Week 1): Pt will initiate stair training PT Short Term Goal 2 (Week 1): Pt will ambulate 50' w/ LRAD w/ min assist PT Short Term Goal 3 (Week 1): Pt will participate in 60 min of upright activity w/o increase in fatigue PT Short Term Goal 4 (Week 1): Pt will attend to L environment/body w/ min cues 50% of the time  Skilled Therapeutic Interventions/Progress Updates:    Pt supine in bed upon PT arrival, agreeable to therapy tx and denies pain. Pt transferred from supine>sitting EOB with min assist, verbal cues for L attention. Pt performed stand pivot transfer towards the left to the w/c, mod assist and verbal cues for techniques. Pt transported to the gym. Pt performed sit<>stand without RW and worked on balance without AD in order to perform card matching activity, x 2 trials with min assist and verbal cues to maintain midline. Pt performed 2 x 5 sit<>stands without UE support, arms across chest, min assist working on LE strength and L LE neuro re-ed. Pt worked on L LE NMR and awareness while standing with RW kicking a ball, min assist for balance and max verbal cues for use of L LE and stepping back with L LE after kicking. Pt transported back to room and performed stand pivot to recliner with min assist to the R. Pt left in recliner with needs in reach and chair alarm set.   Therapy Documentation Precautions:  Precautions Precautions: Fall Restrictions Weight Bearing Restrictions: No    Therapy/Group: Individual Therapy  Netta Corrigan, PT, DPT 01/12/2018, 7:52 AM

## 2018-01-12 NOTE — Progress Notes (Signed)
Speech Language Pathology Weekly Progress and Session Note  Patient Details  Name: [name] MRN: [id number] Date of Birth: June 07, 1940  Beginning of progress report period: January 06, 2018 End of progress report period: January 12, 2018  Today's Date: 01/12/2018 SLP Individual Time: 7846-9629 SLP Individual Time Calculation (min): 55 min  Short Term Goals: Week 1: SLP Short Term Goal 1 (Week 1): Patient will consume current diet with minimal overt s/s of aspiration and supervision verbal cues for use of swallowing strategies.  SLP Short Term Goal 1 - Progress (Week 1): Not met SLP Short Term Goal 2 (Week 1): Patient will consume thin liquids via provale cup without overt s/s of aspiration with Min A verbal cues over 2 sessions prior to upgrade.  SLP Short Term Goal 2 - Progress (Week 1): Met SLP Short Term Goal 3 (Week 1): Patient will demonstrate sustained attention to tasks for ~5 minutes with Min A verbal cues for redirection.  SLP Short Term Goal 3 - Progress (Week 1): Met SLP Short Term Goal 4 (Week 1): Patient will attend to left field of enviornment/body during functional tasks with Mod A verbal cues.  SLP Short Term Goal 4 - Progress (Week 1): Met SLP Short Term Goal 5 (Week 1): Patient will demonstrate basic problem solving for functional and familiar tasks with Mod A verbal cues.  SLP Short Term Goal 5 - Progress (Week 1): Met SLP Short Term Goal 6 (Week 1): Patient will utilize speech intelligibility strategies at the phrase level to achieve ~75% intelligibility with Min A verbal cues.  SLP Short Term Goal 6 - Progress (Week 1): Met    New Short Term Goals: Week 2: SLP Short Term Goal 1 (Week 2): Patient will consume current diet with minimal overt s/s of aspiration and Min verbal cues for use of swallowing strategies.  SLP Short Term Goal 2 (Week 2): Patient will attend to left field of enviornment/body during functional tasks with Min A verbal cues.  SLP Short Term  Goal 3 (Week 2): Patient will demonstrate basic problem solving for functional and familiar tasks with Min A verbal cues.  SLP Short Term Goal 4 (Week 2): Patient will demonstrate sustained attention to tasks for 10 minutes with Min A verbal cues for redirection.  SLP Short Term Goal 5 (Week 2): Patient will utilize speech intelligibility strategies at the sentence level to achieve ~75% intelligibility with Min A verbal cues.   Weekly Progress Updates: Patient has made excellent gains and has met 5 of 6 STGs this reporting period. Currently, patient is consuming Dys. 1 textures with thin liquids via the provale cup with minimal overt s/s of aspiration and overall Mod A verbal cues for use of swallowing compensatory strategies, especially in regards to clearing his oral cavity of residue. Patient currently remains on Dys. 1 textures due to poor fitting dentures and moderate oral residue after meals that requires oral care after meals to remove at times. Patient demonstrates improved speech intelligibility at the phrase level and is ~75-90% intelligible with Min verbal cues needed for a slow rate of speech. Patient also demonstrates improved cognitive functioning and requires overall Mod A verbal cues to complete functional and familiar tasks safely in regards to sustained attention, functional problem solving and attention to left environment/body. Family and patient education ongoing. Patient would benefit from continued skilled SLP intervention to maximize his cognitive and swallowing function as well as his speech intelligibility prior to discharge.      Intensity:  Minumum of 1-2 x/day, 30 to 90 minutes Frequency: 3 to 5 out of 7 days Duration/Length of Stay: 15-19 days  Treatment/Interventions: Cognitive remediation/compensation;Dysphagia/aspiration precaution training;Internal/external aids;Speech/Language facilitation;Therapeutic Activities;Environmental controls;Cueing hierarchy;Functional  tasks;Patient/family education   Daily Session  Skilled Therapeutic Interventions:  Skilled treatment session focused on dysphagia and speech goals. SLP facilitated session by providing Min A verbal cues for use of swallowing compensatory strategies with breakfast meal of Dys. 1 textures and thin liquids via provale cup. Patient with intermittent wet vocal quality that cleared with supervision verbal cues for a cued throat clear. Discussed use of denture adhesive to maximize fit of dentures in hopes of upgrading textures. SLP also facilitated session by providing supervision-Min A verbal cues for use of speech intelligibility strategies at the sentence level to achieve ~90% intelligibility during a verbal description task in a mildly noisy environment.  Patient left upright in wheelchair with alarm on and all needs within reach. Continue with current plan of care.      Pain No/Denies Pain   Therapy/Group: Individual Therapy  PAYNE, [place] 01/12/2018, 11:19 AM

## 2018-01-12 NOTE — Progress Notes (Signed)
Recreational Therapy Session Note  Patient Details  Name: [name] MRN: [id number] Date of Birth: 12-05-1940 Today's Date: 01/12/2018 Time:  1405-1430 Pain: no c/o Skilled Therapeutic Interventions/Progress Updates:  Goal:  Pt will attend LUE (placing & removing from walker splint) with min cues.- NOT MET-mod cues needed.  Pt will maintain dynamic standing balance with mod assist.-MET  Session focused on safety awareness, dynamic standing balance and left inattention during co-treat with PT.  Pt stood for ball kicking activity using LLE to kick a ball with min assist for balance & max instructional cues for technique.  Pt required moderate cues to attend to [place] throughout session.  Pt c/o of intermittent shoulder pain while resting.  Reviewed safe positioning with pt including use of the lap tray on his w/c and propping his LUE on a pillow when in bed.    Therapy/Group: Co-Treatment  Mary-Ann Pennella 01/12/2018, 4:13 PM

## 2018-01-12 NOTE — Progress Notes (Signed)
PHYSICAL MEDICINE & REHABILITATION PROGRESS NOTE   Subjective/Complaints: Patient seen laying in bed this morning.  He states he slept well overnight, confirmed with sleep chart.  ROS: Denies CP SOB, N/V/D Objective:   No results found. Recent Labs    01/12/18 0450  WBC 7.8  HGB 11.5*  HCT 36.9*  PLT 242   Recent Labs    01/12/18 0450  NA 139  K 4.2  CL 110  CO2 24  GLUCOSE 100*  BUN 15  CREATININE 1.25*  CALCIUM 9.7    Intake/Output Summary (Last 24 hours) at 01/12/2018 0837 Last data filed at 01/12/2018 0826 Gross per 24 hour  Intake 250 ml  Output 950 ml  Net -700 ml     Physical Exam: Vital Signs Blood pressure 132/84, pulse 83, temperature 98.2 F (36.8 C), resp. rate 18, height 6\' 2"  (1.88 m), weight 120.4 kg, SpO2 97 %. Constitutional: NAD.  Obese. HENT: Normocephalic.  Atraumatic. Eyes: EOMI.  No discharge. Left ptosis  Cardiovascular: Normal rate and regular rhythm.  No JVD. Respiratory: Effort normal and breath sounds normal.  GI: Bowel sounds are normal.  Nondistended. Musculoskeletal: Lower extremity edema  Neurological: He is alert and oriented.  Left facial weakness with dysarthria.  Left inattention with left visual field deficits.  Motor: RUE: 5/5 proximal distal RLE: 4+/5 proximal to distal LUE: 0/5 proximal distal LLE: 4+/5 proximal distal  Skin: Bilateral shins with stasis changes and wrinkling.   Psychiatric: He has a normal mood and affect. His behavior is normal.   Assessment/Plan: 1. Functional deficits secondary to Right MCA infarct with Left hemiparesis, dysarthria and Dysphagia which require 3+ hours per day of interdisciplinary therapy in a comprehensive inpatient rehab setting.  Physiatrist is providing close team supervision and 24 hour management of active medical problems listed below.  Physiatrist and rehab team continue to assess barriers to discharge/monitor patient progress toward functional and medical  goals  Care Tool:  Bathing    Body parts bathed by patient: Left arm, Chest, Abdomen, Front perineal area, Right upper leg, Left upper leg   Body parts bathed by helper: Buttocks, Right arm, Right lower leg, Left lower leg     Bathing assist Assist Level: Moderate Assistance - Patient 50 - 74%     Upper Body Dressing/Undressing Upper body dressing   What is the patient wearing?: Pull over shirt    Upper body assist Assist Level: Moderate Assistance - Patient 50 - 74%    Lower Body Dressing/Undressing Lower body dressing      What is the patient wearing?: Pants     Lower body assist Assist for lower body dressing: Maximal Assistance - Patient 25 - 49%     Toileting Toileting    Toileting assist Assist for toileting: Moderate Assistance - Patient 50 - 74%     Transfers Chair/bed transfer  Transfers assist     Chair/bed transfer assist level: Minimal Assistance - Patient > 75%     Locomotion Ambulation   Ambulation assist      Assist level: Moderate Assistance - Patient 50 - 74% Assistive device: Walker-rolling Max distance: 50'   Walk 10 feet activity   Assist     Assist level: Moderate Assistance - Patient - 50 - 74% Assistive device: Walker-rolling   Walk 50 feet activity   Assist Walk 50 feet with 2 turns activity did not occur: Safety/medical concerns  Assist level: Moderate Assistance - Patient - 50 - 74% Assistive device: Walker-rolling  Walk 150 feet activity   Assist Walk 150 feet activity did not occur: Safety/medical concerns         Walk 10 feet on uneven surface  activity   Assist Walk 10 feet on uneven surfaces activity did not occur: Safety/medical concerns         Wheelchair     Assist Will patient use wheelchair at discharge?: (TBD) Type of Wheelchair: Manual    Wheelchair assist level: Minimal Assistance - Patient > 75% Max wheelchair distance: 100'    Wheelchair 50 feet with 2 turns  activity    Assist        Assist Level: Minimal Assistance - Patient > 75%   Wheelchair 150 feet activity     Assist     Assist Level: Minimal Assistance - Patient > 75%     Medical Problem List and Plan: 1.  Deficits with mobility, transfers, self-care, speech, swallowing secondary to multiple right infarcts acute sub acute in MCA as well as MCA/ACA watershed distribution and remote hemorrhagic infarct right thalamic.    Cont CIR 2.  DVT Prophylaxis/Anticoagulation: Pharmaceutical: Lovenox 3. Neck/shoulder pain/Pain Management: Muscle strain due to left inattention. Added K pad in addition to Sportscreme for local measures.  4. Mood: LCSW to follow for evaluation and support.  5. Neuropsych: This patient is not fully capable of making decisions on his own behalf. 6. Skin/Wound Care: Routine pressure relief measures.  7. Fluids/Electrolytes/Nutrition: Monitor I/O. Nocturnal hydration.  8.  Essential hypertension: continue Norvasc and lopressor--avoid hypotension.   Increased amlodipine to 10mg  qd Vitals:   01/11/18 1955 01/12/18 0353  BP: (!) 147/80 132/84  Pulse: 78 83  Resp: 18 18  Temp: 98.1 F (36.7 C) 98.2 F (36.8 C)  SpO2: 100% 97%   Elevated on 11/13 9.  CAD: Continue aspirin, lopressor and statin.  Resumed beta-blocker as blood pressures stabilized.10.  History of gout: On allopurinol 11. Chronic renal failure: Baseline serum creatinine at 1.4?  improved with hydration.   Creatinine 1.25 on 11/13  Encourage fluids 12.  Anemia of chronic disease: Monitor for now  Hemoglobin 11.5 on 11/13  Continue to monitor 13. H/o peripheral edema: has 2-3+ edema at baseline. Added TEDs.  14.  Fever: Afebrile x48 hours  aymptomatic, intermittent urinary incont , resolved, urine culture no growth  LOS: 7 days A FACE TO FACE EVALUATION WAS PERFORMED  Britne Borelli Lorie Phenix 01/12/2018, 8:37 AM

## 2018-01-13 ENCOUNTER — Inpatient Hospital Stay (HOSPITAL_COMMUNITY): Payer: Medicare Other | Admitting: Speech Pathology

## 2018-01-13 ENCOUNTER — Inpatient Hospital Stay (HOSPITAL_COMMUNITY): Payer: Medicare Other | Admitting: Physical Therapy

## 2018-01-13 ENCOUNTER — Ambulatory Visit (HOSPITAL_COMMUNITY): Payer: Medicare Other | Admitting: Physical Therapy

## 2018-01-13 ENCOUNTER — Inpatient Hospital Stay (HOSPITAL_COMMUNITY): Payer: Medicare Other

## 2018-01-13 DIAGNOSIS — G8194 Hemiplegia, unspecified affecting left nondominant side: Secondary | ICD-10-CM

## 2018-01-13 NOTE — Progress Notes (Signed)
Speech Language Pathology Daily Session Note  Patient Details  Name: [name] MRN: [id number] Date of Birth: 05-17-1940  Today's Date: 01/13/2018 SLP Individual Time: 1030-1130 SLP Individual Time Calculation (min): 60 min  Short Term Goals: Week 2: SLP Short Term Goal 1 (Week 2): Patient will consume current diet with minimal overt s/s of aspiration and Min verbal cues for use of swallowing strategies.  SLP Short Term Goal 2 (Week 2): Patient will attend to left field of enviornment/body during functional tasks with Min A verbal cues.  SLP Short Term Goal 3 (Week 2): Patient will demonstrate basic problem solving for functional and familiar tasks with Min A verbal cues.  SLP Short Term Goal 4 (Week 2): Patient will demonstrate sustained attention to tasks for 10 minutes with Min A verbal cues for redirection.  SLP Short Term Goal 5 (Week 2): Patient will utilize speech intelligibility strategies at the sentence level to achieve ~75% intelligibility with Min A verbal cues.   Skilled Therapeutic Interventions:   Skilled interventions included clinical observations during therapeutic trials, teaching/training in compensatory strategies for safe swallowing, and education re: fall prevention techniques for home. SLP assisted patient with oral care and placement of dentures with denture adhesive cream. Given set-up assistance, pt consumed Dys 2 snack trial with mild oral spillage and mild-mod oral residue, however cleared given verbal cues to utilize lingual sweep and thin liquid washes (via Provale cup). No coughing/choking noted during session. Pt agreed to Dys 2 tray trial during next scheduled session. Family member present. SLP provided education re: fall prevention techniques at home with use of walker. Pt stated x1 strategy independently, however demonstrated decreased awareness re: potential fall risks in the home environment and requiring specific equipment from PT/OT. SLP encouraged pt and  family member to discuss further questions re: ambulation and equipment needs for home with PT/OT. Family member verbalized understanding. SLP assisted patient back to room with chair alarm on, call bell within reach, and family member present. Continue POC.   Pain Pain Assessment Pain Score: 0-No pain  Therapy/Group: Individual Therapy  Loni Beckwith, M.S. CCC-SLP Speech-Language Pathologist  Loni Beckwith 01/13/2018, 11:42 AM

## 2018-01-13 NOTE — Progress Notes (Signed)
Occupational Therapy Weekly Progress Note  Patient Details  Name: [name] MRN: [id number] Date of Birth: [birth year]  Beginning of progress report period: January 06, 2018 End of progress report period: January 13, 2018   Patient has met 5 of 5 short term goals. Pt has made steady progress with BADLs since admission.  Pt continues to require mod verbal cues to attend to his L during bathing/dressing tasks.  Pt continues to require min verbal cues for sequencing and mod verbal cues for utilizing hemi dressing techniques.  Pt maintains sitting balance with supervision. Pt performs functional transfers with min A and requires min A for standing balance for bathing/dressing tasks.  Pt with trace shoulder flexion/extension and elbow flexion/extension.    Patient continues to demonstrate the following deficits: muscle weakness, decreased cardiorespiratoy endurance, abnormal tone, unbalanced muscle activation, motor apraxia, decreased coordination and decreased motor planning, decreased visual perceptual skills, decreased midline orientation and decreased attention to left and decreased sitting balance, decreased standing balance, decreased postural control, hemiplegia and decreased balance strategies and therefore will continue to benefit from skilled OT intervention to enhance overall performance with BADL, iADL and Reduce care partner burden.  Patient progressing toward long term goals..  Continue plan of care.  OT Short Term Goals Week 1:  OT Short Term Goal 1 (Week 1): Pt will be able to wash UB with min A. OT Short Term Goal 1 - Progress (Week 1): Met OT Short Term Goal 2 (Week 1): Pt will be able to wash LB with mod A. OT Short Term Goal 2 - Progress (Week 1): Met OT Short Term Goal 3 (Week 1): Pt will able to don shirt with mod A. OT Short Term Goal 3 - Progress (Week 1): Met OT Short Term Goal 4 (Week 1): Pt will be able to don pant with max A. OT Short Term Goal 4 - Progress (Week 1):  Met OT Short Term Goal 5 (Week 1): Pt will be able to transfer to [facility] with mod A. OT Short Term Goal 5 - Progress (Week 1): Met Week 2:  OT Short Term Goal 1 (Week 2): Pt will perform toilet transfers with min A stand pivot OT Short Term Goal 2 (Week 2): Pt will perform toileting tasks with mod A OT Short Term Goal 3 (Week 2): Pt will completed LB dressing tasks with min A OT Short Term Goal 4 (Week 2): Pt will perform UB dressing tasks with min A      Therapy Documentation Precautions:  Precautions Precautions: Fall Restrictions Weight Bearing Restrictions: No   Therapy/Group: Individual Therapy  Leroy Libman 01/13/2018, 8:05 AM

## 2018-01-13 NOTE — Progress Notes (Signed)
Occupational Therapy Session Note  Patient Details  Name: [name] MRN: [id number] Date of Birth: 1941-01-30  Today's Date: 01/13/2018 OT Individual Time: 0700-0758 OT Individual Time Calculation (min): 58 min    Short Term Goals: Week 1:  OT Short Term Goal 1 (Week 1): Pt will be able to wash UB with min A. OT Short Term Goal 1 - Progress (Week 1): Other (comment) OT Short Term Goal 2 (Week 1): Pt will be able to wash LB with mod A. OT Short Term Goal 3 (Week 1): Pt will able to don shirt with mod A. OT Short Term Goal 4 (Week 1): Pt will be able to don pant with max A. OT Short Term Goal 5 (Week 1): Pt will be able to transfer to [facility] with mod A.  Skilled Therapeutic Interventions/Progress Updates:    OT intervention with focus on bed mobility, sitting balance, sit<>stand, standing balance, functional transfers, BADL retraining (bathing/dressing), activity tolerance, and safety awareness to increase independence with BADLs. See below for bathing/dressing.  Pt requires mod verbal cues to attend to L side during bathing tasks.  Pt requires mod verbal cues for utilizing hemi dressing techniques.  Pt performs sit<>stand with min A and maintains standing balance with min A/CGA. Pt attempts to initiate use of LUE during bathing tasks but requires [facility] assistance.  Pt with trace shoulder and elbow movement this morning.  Pt requires max A for use of LUE as stabilizer.  Pt remained in w/c with half lap tray in place, all needs within reach, and belt alarm activated.   Therapy Documentation Precautions:  Precautions Precautions: Fall Restrictions Weight Bearing Restrictions: No   Pain:  Pt denies pain ADL: ADL Eating: Set up Grooming: Minimal assistance Where Assessed-Grooming: Sitting at sink, Wheelchair Upper Body Bathing: Minimal assistance Where Assessed-Upper Body Bathing: Sitting at sink, Wheelchair Lower Body Bathing: Minimal assistance Where Assessed-Lower Body Bathing:  Standing at sink, Wheelchair Upper Body Dressing: Moderate assistance Where Assessed-Upper Body Dressing: Sitting at sink, Wheelchair Lower Body Dressing: Moderate assistance Where Assessed-Lower Body Dressing: Standing at sink, Wheelchair   Therapy/Group: Individual Therapy  Leroy Libman 01/13/2018, 8:02 AM

## 2018-01-13 NOTE — Progress Notes (Signed)
[place] PHYSICAL MEDICINE & REHABILITATION PROGRESS NOTE   Subjective/Complaints: Patient seen sitting up in a chair working with therapy this morning.  He states he slept well overnight.  ROS: Denies CP SOB, N/V/D  Objective:   No results found. Recent Labs    01/12/18 0450  WBC 7.8  HGB 11.5*  HCT 36.9*  PLT 242   Recent Labs    01/12/18 0450  NA 139  K 4.2  CL 110  CO2 24  GLUCOSE 100*  BUN 15  CREATININE 1.25*  CALCIUM 9.7    Intake/Output Summary (Last 24 hours) at 01/13/2018 1245 Last data filed at 01/13/2018 1015 Gross per 24 hour  Intake 659 ml  Output 1050 ml  Net -391 ml     Physical Exam: Vital Signs Blood pressure 129/80, pulse 80, temperature 99 F (37.2 C), temperature source Oral, resp. rate 18, height 6\' 2"  (1.88 m), weight 120.4 kg, SpO2 97 %. Constitutional: NAD.  Obese. HENT: Normocephalic.  Atraumatic. Eyes: EOMI.  No discharge. Left ptosis  Cardiovascular: RRR.  No JVD. Respiratory: Effort normal and breath sounds normal.  GI: Bowel sounds are normal.  Nondistended. Musculoskeletal: Lower extremity edema  Neurological: He is alert and oriented.  Left facial weakness with dysarthria.  Left inattention with left visual field deficits.  Motor: RUE: 5/5 proximal distal RLE: 5/5 proximal to distal LUE: Shoulder abduction 2-/5, elbow flexion 1+/5, elbow extension 1/5, distally trace  LLE: 4+/5 proximal distal  Skin: Bilateral shins with stasis changes and wrinkling.   Psychiatric: He has a normal mood and affect. His behavior is normal.   Assessment/Plan: 1. Functional deficits secondary to Right MCA infarct with Left hemiparesis, dysarthria and Dysphagia which require 3+ hours per day of interdisciplinary therapy in a comprehensive inpatient rehab setting.  Physiatrist is providing close team supervision and 24 hour management of active medical problems listed below.  Physiatrist and rehab team continue to assess barriers to  discharge/monitor patient progress toward functional and medical goals  Care Tool:  Bathing    Body parts bathed by patient: Chest, Abdomen, Front perineal area, Buttocks, Right upper leg, Left upper leg, Right lower leg, Right arm, Face   Body parts bathed by helper: Left arm, Left lower leg     Bathing assist Assist Level: Minimal Assistance - Patient > 75%     Upper Body Dressing/Undressing Upper body dressing   What is the patient wearing?: Pull over shirt    Upper body assist Assist Level: Moderate Assistance - Patient 50 - 74%    Lower Body Dressing/Undressing Lower body dressing      What is the patient wearing?: Pants     Lower body assist Assist for lower body dressing: Moderate Assistance - Patient 50 - 74%     Toileting Toileting    Toileting assist Assist for toileting: Moderate Assistance - Patient 50 - 74%     Transfers Chair/bed transfer  Transfers assist     Chair/bed transfer assist level: Moderate Assistance - Patient 50 - 74%     Locomotion Ambulation   Ambulation assist      Assist level: Moderate Assistance - Patient 50 - 74% Assistive device: Walker-rolling Max distance: 50'   Walk 10 feet activity   Assist     Assist level: Moderate Assistance - Patient - 50 - 74% Assistive device: Walker-rolling   Walk 50 feet activity   Assist Walk 50 feet with 2 turns activity did not occur: Safety/medical concerns  Assist level:  Moderate Assistance - Patient - 50 - 74% Assistive device: Walker-rolling    Walk 150 feet activity   Assist Walk 150 feet activity did not occur: Safety/medical concerns         Walk 10 feet on uneven surface  activity   Assist Walk 10 feet on uneven surfaces activity did not occur: Safety/medical concerns         Wheelchair     Assist Will patient use wheelchair at discharge?: (TBD) Type of Wheelchair: Manual    Wheelchair assist level: Minimal Assistance - Patient > 75% Max  wheelchair distance: 100'    Wheelchair 50 feet with 2 turns activity    Assist        Assist Level: Minimal Assistance - Patient > 75%   Wheelchair 150 feet activity     Assist     Assist Level: Minimal Assistance - Patient > 75%     Medical Problem List and Plan: 1.  Deficits with mobility, transfers, self-care, speech, swallowing secondary to multiple right infarcts acute sub acute in MCA as well as MCA/ACA watershed distribution and remote hemorrhagic infarct right thalamic.    Cont CIR  WHO ordered 2.  DVT Prophylaxis/Anticoagulation: Pharmaceutical: Lovenox 3. Neck/shoulder pain/Pain Management: Muscle strain due to left inattention. Added K pad in addition to Sportscreme for local measures.  4. Mood: LCSW to follow for evaluation and support.  5. Neuropsych: This patient is not fully capable of making decisions on his own behalf. 6. Skin/Wound Care: Routine pressure relief measures.  7. Fluids/Electrolytes/Nutrition: Monitor I/O. Nocturnal hydration.  8.  Essential hypertension: continue Norvasc and lopressor--avoid hypotension.   Increased amlodipine to 10mg  qd Vitals:   01/12/18 2045 01/13/18 0604  BP: (!) 146/69 129/80  Pulse: 76 80  Resp: 18 18  Temp: 98.7 F (37.1 C) 99 F (37.2 C)  SpO2: 100% 97%   Relatively controlled on 11/14 9.  CAD: Continue aspirin, lopressor and statin.  Resumed beta-blocker as blood pressures stabilized.10.  History of gout: On allopurinol 11. Chronic renal failure: Baseline serum creatinine at 1.4?  improved with hydration.   Creatinine 1.25 on 11/13  Encourage fluids 12.  Anemia of chronic disease: Monitor for now  Hemoglobin 11.5 on 11/13  Continue to monitor 13. H/o peripheral edema: has 2-3+ edema at baseline. Added TEDs.  14.  Fever: Resolved  aymptomatic, intermittent urinary incont , resolved, urine culture no growth  LOS: 8 days A FACE TO FACE EVALUATION WAS PERFORMED  Ankit Lorie Phenix 01/13/2018, 12:45 PM

## 2018-01-13 NOTE — Progress Notes (Signed)
Physical Therapy Weekly Progress Note  Patient Details  Name: [name] MRN: [id number] Date of Birth: April 11, 1940  Beginning of progress report period: January 06, 2018 End of progress report period: January 13, 2018  Today's Date: 01/13/2018 PT Individual Time: 1410-1520 PT Individual Total Time: 70 min  Patient has met 4 of 4 short term goals. Pt is making steady progress towards LTGs. He is performing all OOB mobility w/ CGA-min assist including transfers, stairs x4 steps, and household distance gait w/ RW. He is most limited in functional independence by L inattention.   Patient continues to demonstrate the following deficits muscle weakness and muscle joint tightness, decreased cardiorespiratoy endurance, unbalanced muscle activation, decreased coordination and decreased motor planning, decreased attention to left, decreased problem solving, decreased safety awareness and delayed processing and decreased sitting balance, decreased standing balance, decreased postural control, hemiplegia and decreased balance strategies and therefore will continue to benefit from skilled PT intervention to increase functional independence with mobility.  Patient progressing toward long term goals..  Continue plan of care.  PT Short Term Goals Week 1:  PT Short Term Goal 1 (Week 1): Pt will initiate stair training PT Short Term Goal 1 - Progress (Week 1): Met PT Short Term Goal 2 (Week 1): Pt will ambulate 50' w/ LRAD w/ min assist PT Short Term Goal 2 - Progress (Week 1): Met PT Short Term Goal 3 (Week 1): Pt will participate in 60 min of upright activity w/o increase in fatigue PT Short Term Goal 3 - Progress (Week 1): Met PT Short Term Goal 4 (Week 1): Pt will attend to L environment/body w/ min cues 50% of the time PT Short Term Goal 4 - Progress (Week 1): Met Week 2:  PT Short Term Goal 1 (Week 2): =LTGs due to ELOS  Skilled Therapeutic Interventions/Progress Updates:   Pt in w/c and  agreeable to therapy, denies pain. Total assist w/c transport to/from therapy gym for time management. Worked on gait training w/ and w/o AD this session. Ambulated 100' x2, min assist for RW management and verbal cues for L environmental awareness. Ambulated 100' w/o AD, min assist for postural control and lateral weight shifting for L foot clearance. About the same amount of assist needed w/ RW than w/o RW. Worked on dynamic standing balance w/o UE support remainder of session w/ emphasis on L environmental awareness, maintaining balance w/ reaching low and high, and LLE quad activation w/ L weight bearing and prolonged standing. Verbal and visual cues for balance strategies and CGA for safety. Able to stand and stabilize in stance w/o UE support w/ CGA.  Returned to room via w/c, ended session in w/c and all needs in reach.   Therapy Documentation Precautions:  Precautions Precautions: Fall Restrictions Weight Bearing Restrictions: No  Therapy/Group: Individual Therapy  [name] Clent Demark 01/13/2018, 11:37 AM

## 2018-01-14 ENCOUNTER — Inpatient Hospital Stay (HOSPITAL_COMMUNITY): Payer: Medicare Other | Admitting: Physical Therapy

## 2018-01-14 ENCOUNTER — Inpatient Hospital Stay (HOSPITAL_COMMUNITY): Payer: Medicare Other

## 2018-01-14 ENCOUNTER — Inpatient Hospital Stay (HOSPITAL_COMMUNITY): Payer: Medicare Other | Admitting: Speech Pathology

## 2018-01-14 MED ORDER — FUROSEMIDE 20 MG PO TABS
20.0000 mg | ORAL_TABLET | Freq: Every day | ORAL | Status: DC
  Administered 2018-01-14 – 2018-01-17 (×4): 20 mg via ORAL
  Filled 2018-01-14 (×5): qty 1

## 2018-01-14 NOTE — Progress Notes (Signed)
Speech Language Pathology Daily Session Note  Patient Details  Name: [name] MRN: [id number] Date of Birth: [birth year]  Today's Date: 01/14/2018 SLP Individual Time: 1115-1200 SLP Individual Time Calculation (min): 45 min  Short Term Goals: Week 2: SLP Short Term Goal 1 (Week 2): Patient will consume current diet with minimal overt s/s of aspiration and Min verbal cues for use of swallowing strategies.  SLP Short Term Goal 2 (Week 2): Patient will attend to left field of enviornment/body during functional tasks with Min A verbal cues.  SLP Short Term Goal 3 (Week 2): Patient will demonstrate basic problem solving for functional and familiar tasks with Min A verbal cues.  SLP Short Term Goal 4 (Week 2): Patient will demonstrate sustained attention to tasks for 10 minutes with Min A verbal cues for redirection.  SLP Short Term Goal 5 (Week 2): Patient will utilize speech intelligibility strategies at the sentence level to achieve ~75% intelligibility with Min A verbal cues.   Skilled Therapeutic Interventions:  Skilled treatment session focused on dysphagia and cognition goals. SLP facilitiated session by providing simple sorting task with Max A faded to mod A for location of items to left of pt's midline. SLP further facilitated session by providing skilled observation of pt consuming dysphagia 2 lunch tray with thin liquids via Provale cup. Pt with significant left buccal pocketing and after Total A multimodal cues (lingual sweep, finger sweep, removing bottom dentures - per pt request, use of spoon to get residue out of buccal area and liquid wash) pt able to clear. Pt required Max A verbal and visual cues to place bolus on right and to place only one bite at a time. This was labor-some for pt but he was able to decrease the amount of residue but he continued to pocket on left throughout. Will defer upgrade to dysphagia 2 until pt is more efficient with use of compensatory strategies. Pt  returned to room, left upright in wheelchair, lap alarm on and all needs within reach. Nurse present.      Pain Pain Assessment Pain Scale: 0-10 Pain Score: 0-No pain  Therapy/Group: Individual Therapy  [name] 01/14/2018, 12:42 PM

## 2018-01-14 NOTE — Progress Notes (Signed)
Occupational Therapy Session Note  Patient Details  Name: [name] MRN: [id number] Date of Birth: August 20, 1940  Today's Date: 01/14/2018 OT Individual Time: 0700-0830 OT Individual Time Calculation (min): 90 min    Short Term Goals: Week 2:  OT Short Term Goal 1 (Week 2): Pt will perform toilet transfers with min A stand pivot OT Short Term Goal 2 (Week 2): Pt will perform toileting tasks with mod A OT Short Term Goal 3 (Week 2): Pt will completed LB dressing tasks with min A OT Short Term Goal 4 (Week 2): Pt will perform UB dressing tasks with min A  Skilled Therapeutic Interventions/Progress Updates:    OT intervention with focus on bed mobility (mod A), sit<>stand (min A), functional amb with RW (min A with max verbal cues), BADL retraining including bathing at shower level and dressing with sit<>stand from w/c at sink (see below), standing balance, LUE NMR, attention to L, activity tolerance, and safety awareness to increase independence with BADLs. Pt commented that he didn't rest well during the night and pt fatigues quickly requiring multiple rest break during session.  Pt required max verbal cues this morning to attend to his L and advance his LLE during ambulation to/from bathroom.  Pt engaged in LUE NMR to facilitate increase movement in functional tasks. Pt currently requires HOH assistance to engage LUE in functional tasks.  Pt commented on how fatigued he was this morning.  Pt remained in w/c with all needs within reach, belt alarm activated, and half lap tray in place.  RN notified at end of session to assist with eating breakfast.   Therapy Documentation Precautions:  Precautions Precautions: Fall Restrictions Weight Bearing Restrictions: No Pain:   Pt c/o increased "stiffness" in posterior neck; soft tissue mobilizations ADL: ADL Eating: Set up Grooming: Minimal assistance Where Assessed-Grooming: Sitting at sink, Wheelchair Upper Body Bathing: Minimal  assistance Where Assessed-Upper Body Bathing: Shower Lower Body Bathing: Minimal assistance Where Assessed-Lower Body Bathing: Shower Upper Body Dressing: Moderate assistance Where Assessed-Upper Body Dressing: Sitting at sink, Wheelchair Lower Body Dressing: Moderate assistance Where Assessed-Lower Body Dressing: Standing at sink, Wheelchair Toileting: Not assessed Toilet Transfer: Not assessed Social research officer, government: Minimal assistance, Maximal cueing Social research officer, government Method: Heritage manager: Radio broadcast assistant   Therapy/Group: Individual Therapy  Leroy Libman 01/14/2018, 8:30 AM

## 2018-01-14 NOTE — Progress Notes (Signed)
Social Work Patient ID: [name], male   DOB: 15-Apr-1940, 77 y.o.   MRN: [id number] Spoke with wife via telephone to discuss team conference goals supervision-CGA and target discharge 11/23. Discussed her need to come in prior to discharge and participate in his therapies to see if comfortable with his care and demonstrate. Pt aware of his target discharge date and goals. He is working really hard in therapies and would like to exceed these goals. Will see wife next Wed to meet with and discuss goals and discharge.

## 2018-01-14 NOTE — Progress Notes (Signed)
Occupational Therapy Session Note  Patient Details  Name: [name] MRN: [id number] Date of Birth: 1940/05/29  Today's Date: 01/14/2018 OT Individual Time: 0902-0929 OT Individual Time Calculation (min): 27 min    Short Term Goals: Week 1:  OT Short Term Goal 1 (Week 1): Pt will be able to wash UB with min A. OT Short Term Goal 1 - Progress (Week 1): Met OT Short Term Goal 2 (Week 1): Pt will be able to wash LB with mod A. OT Short Term Goal 2 - Progress (Week 1): Met OT Short Term Goal 3 (Week 1): Pt will able to don shirt with mod A. OT Short Term Goal 3 - Progress (Week 1): Met OT Short Term Goal 4 (Week 1): Pt will be able to don pant with max A. OT Short Term Goal 4 - Progress (Week 1): Met OT Short Term Goal 5 (Week 1): Pt will be able to transfer to [facility] with mod A. OT Short Term Goal 5 - Progress (Week 1): Met  Skilled Therapeutic Interventions/Progress Updates:    1;1. Pt received in w/c with no c/o pain. Pt completes towel glides seated in w/c with mod-max A for full ROM and scapular facilitation for WB/L NMR:  Shoulder flex/ext Elbow flex/ext Int/ext rotation Horizontal ab/adduction  Scapular elevation/depression Pro/retraction  2x1 min per exercise second round up graded incline against gravity.  OT cues for decreased trunk compensatory movement. Exited session with pt seated in w/c,call light in reach and exit alarm on  Therapy Documentation Precautions:  Precautions Precautions: Fall Restrictions Weight Bearing Restrictions: No   Therapy/Group: Individual Therapy  Tonny Branch 01/14/2018, 9:29 AM

## 2018-01-14 NOTE — Progress Notes (Signed)
[place] PHYSICAL MEDICINE & REHABILITATION PROGRESS NOTE   Subjective/Complaints: Patient seen working with therapy this morning.  He states he slept fairly overnight due to neck pain.  He appears to have slept awkwardly.  He states it is better this morning.  ROS: Denies CP SOB, N/V/D  Objective:   No results found. Recent Labs    01/12/18 0450  WBC 7.8  HGB 11.5*  HCT 36.9*  PLT 242   Recent Labs    01/12/18 0450  NA 139  K 4.2  CL 110  CO2 24  GLUCOSE 100*  BUN 15  CREATININE 1.25*  CALCIUM 9.7    Intake/Output Summary (Last 24 hours) at 01/14/2018 1143 Last data filed at 01/14/2018 0846 Gross per 24 hour  Intake 462 ml  Output 550 ml  Net -88 ml     Physical Exam: Vital Signs Blood pressure 117/78, pulse 86, temperature 98.3 F (36.8 C), resp. rate 19, height 6\' 2"  (1.88 m), weight 120.4 kg, SpO2 99 %. Constitutional: NAD.  Obese. HENT: Normocephalic.  Atraumatic. Eyes: EOMI.  No discharge. Left ptosis  Cardiovascular: RRR.  No JVD. Respiratory: Effort normal and breath sounds normal.  GI: Bowel sounds are normal.  Nondistended. Musculoskeletal: Lower extremity edema  Neurological: He is alert and oriented.  Left facial weakness with dysarthria.  Left inattention with left visual field deficits.  Motor: RUE: 5/5 proximal distal RLE: 5/5 proximal to distal LUE: Shoulder abduction 2-/5, elbow flexion 1+/5, elbow extension 1/5, distally trace  LLE: 4+/5 proximal distal  Skin: Bilateral shins with stasis changes and wrinkling.    Sternum with healed scar Psychiatric: He has a normal mood and affect. His behavior is normal.   Assessment/Plan: 1. Functional deficits secondary to Right MCA infarct with Left hemiparesis, dysarthria and Dysphagia which require 3+ hours per day of interdisciplinary therapy in a comprehensive inpatient rehab setting.  Physiatrist is providing close team supervision and 24 hour management of active medical problems listed  below.  Physiatrist and rehab team continue to assess barriers to discharge/monitor patient progress toward functional and medical goals  Care Tool:  Bathing    Body parts bathed by patient: Chest, Abdomen, Front perineal area, Buttocks, Right upper leg, Left upper leg, Right lower leg, Right arm, Face   Body parts bathed by helper: Left arm, Left lower leg     Bathing assist Assist Level: Minimal Assistance - Patient > 75%     Upper Body Dressing/Undressing Upper body dressing   What is the patient wearing?: Pull over shirt    Upper body assist Assist Level: Moderate Assistance - Patient 50 - 74%    Lower Body Dressing/Undressing Lower body dressing      What is the patient wearing?: Pants     Lower body assist Assist for lower body dressing: Moderate Assistance - Patient 50 - 74%     Toileting Toileting    Toileting assist Assist for toileting: Moderate Assistance - Patient 50 - 74%     Transfers Chair/bed transfer  Transfers assist     Chair/bed transfer assist level: Minimal Assistance - Patient > 75%     Locomotion Ambulation   Ambulation assist      Assist level: Minimal Assistance - Patient > 75% Assistive device: Walker-rolling Max distance: 100'   Walk 10 feet activity   Assist     Assist level: Minimal Assistance - Patient > 75% Assistive device: Walker-rolling   Walk 50 feet activity   Assist Walk 50 feet  with 2 turns activity did not occur: Safety/medical concerns  Assist level: Minimal Assistance - Patient > 75% Assistive device: Walker-rolling    Walk 150 feet activity   Assist Walk 150 feet activity did not occur: Safety/medical concerns         Walk 10 feet on uneven surface  activity   Assist Walk 10 feet on uneven surfaces activity did not occur: Safety/medical concerns         Wheelchair     Assist Will patient use wheelchair at discharge?: (TBD) Type of Wheelchair: Manual    Wheelchair assist  level: Minimal Assistance - Patient > 75% Max wheelchair distance: 100'    Wheelchair 50 feet with 2 turns activity    Assist        Assist Level: Minimal Assistance - Patient > 75%   Wheelchair 150 feet activity     Assist     Assist Level: Minimal Assistance - Patient > 75%     Medical Problem List and Plan: 1.  Deficits with mobility, transfers, self-care, speech, swallowing secondary to multiple right infarcts acute sub acute in MCA as well as MCA/ACA watershed distribution and remote hemorrhagic infarct right thalamic.    Cont CIR  WHO ordered 2.  DVT Prophylaxis/Anticoagulation: Pharmaceutical: Lovenox 3. Neck/shoulder pain/Pain Management: Muscle strain due to left inattention. Added K pad in addition to Sportscreme for local measures.  4. Mood: LCSW to follow for evaluation and support.  5. Neuropsych: This patient is not fully capable of making decisions on his own behalf. 6. Skin/Wound Care: Routine pressure relief measures.  7. Fluids/Electrolytes/Nutrition: Monitor I/O. Nocturnal hydration.  8.  Essential hypertension: continue Norvasc and lopressor--avoid hypotension.   Increased amlodipine to 10mg  qd Vitals:   01/13/18 2017 01/14/18 0441  BP: (!) 148/94 117/78  Pulse: 77 86  Resp: 20 19  Temp: 99.1 F (37.3 C) 98.3 F (36.8 C)  SpO2: 100% 99%   Labile on 11/15 9.  CAD: Continue aspirin, lopressor and statin.  Resumed beta-blocker as blood pressures stabilized.10.  History of gout: On allopurinol 11. Chronic renal failure: Baseline serum creatinine at 1.4?  improved with hydration.   Creatinine 1.25 on 11/13  Labs ordered for Monday  Encourage fluids 12.  Anemia of chronic disease: Monitor for now  Hemoglobin 11.5 on 11/13  Labs ordered for Monday  Continue to monitor 13. H/o peripheral edema: has 2-3+ edema at baseline. Added TEDs.   Lasix started on 11/15 14.  Fever: Resolved  aymptomatic, intermittent urinary incont , resolved, urine  culture no growth  LOS: 9 days A FACE TO FACE EVALUATION WAS PERFORMED  Goran Olden Lorie Phenix 01/14/2018, 11:43 AM

## 2018-01-14 NOTE — Progress Notes (Signed)
Physical Therapy Session Note  Patient Details  Name: [name] MRN: [id number] Date of Birth: 04-19-1940  Today's Date: 01/14/2018 PT Individual Time: 1005-1100 PT Individual Time Calculation (min): 55 min   Short Term Goals: Week 2:  PT Short Term Goal 1 (Week 2): =LTGs due to ELOS  Skilled Therapeutic Interventions/Progress Updates:   Pt in w/c and agreeable to therapy, neck pain 3/10 and ongoing. Total assist w/c transport to therapy gym for time management. Worked on [employer] this session. Performed NuStep 5 min @ level 3 for LE strengthening and reciprocal movement pattern. Worked on dynamic standing balance and stepping strategies. Performed alternating stepping to visual target w/ BLEs. Manual block in between feet to prevent R stepping medially when returning to baseline. Performed same task on 2" step w/ min-mod assist needed for balance. Also did blocked practice of R stepping only to work on weight bearing on LLE and maintaining neutral pelvis w/ slow and controlled movements. Self-propelled w/c w/ BLEs, ~150', to work on LE strength and reciprocal movement pattern. Pt needed more frequent seated rest breaks this session 2/2 increased fatigue, reports he did not sleep well last night. Ended session in w/c and waiting on next therapy session in day room, quick release belt donned and alarm set.   Therapy Documentation Precautions:  Precautions Precautions: Fall Restrictions Weight Bearing Restrictions: No  Therapy/Group: Individual Therapy  Lyrica Mcclarty K Anderia Lorenzo 01/14/2018, 12:08 PM

## 2018-01-14 NOTE — Progress Notes (Signed)
Orthopedic Tech Progress Note Patient Details:  [name] [birth year] [id number]  Patient ID: [name], male   DOB: [birth year], 77 y.o.   MRN: [id number] Called order into [facility]anger.  Karolee Stamps 01/14/2018, 9:40 AM

## 2018-01-15 ENCOUNTER — Inpatient Hospital Stay (HOSPITAL_COMMUNITY): Payer: Medicare Other | Admitting: Physical Therapy

## 2018-01-15 NOTE — Progress Notes (Signed)
[facility] PHYSICAL MEDICINE & REHABILITATION PROGRESS NOTE   Subjective/Complaints: Patient seen laying in bed this morning.  He states he slept well overnight.  He denies complaints.  ROS: Denies CP SOB, N/V/D  Objective:   No results found. No results for input(s): WBC, HGB, HCT, PLT in the last 72 hours. No results for input(s): NA, K, CL, CO2, GLUCOSE, BUN, CREATININE, CALCIUM in the last 72 hours.  Intake/Output Summary (Last 24 hours) at 01/15/2018 1549 Last data filed at 01/15/2018 1300 Gross per 24 hour  Intake 956 ml  Output 500 ml  Net 456 ml     Physical Exam: Vital Signs Blood pressure 106/76, pulse 73, temperature 98.1 F (36.7 C), temperature source Oral, resp. rate 19, height 6\' 2"  (1.88 m), weight 120.4 kg, SpO2 99 %. Constitutional: NAD.  Obese. HENT: Normocephalic.  Atraumatic. Eyes: EOMI.  No discharge. Left ptosis  Cardiovascular: RRR.  No JVD. Respiratory: Effort normal and breath sounds normal.  GI: Bowel sounds are normal.  Nondistended. Musculoskeletal: Lower extremity edema, stable Neurological: He is alert and oriented.  Left facial weakness with dysarthria.  Left inattention with left visual field deficits.  Motor: RUE: 5/5 proximal distal RLE: 5/5 proximal to distal LUE: Shoulder abduction 2-/5, elbow flexion 1+/5, elbow extension 1/5, distally trace, stable LLE: 4+/5 proximal distal, stable  Skin: Bilateral shins with stasis changes and wrinkling.    Sternum with healed scar Psychiatric: He has a normal mood and affect. His behavior is normal.   Assessment/Plan: 1. Functional deficits secondary to Right MCA infarct with Left hemiparesis, dysarthria and Dysphagia which require 3+ hours per day of interdisciplinary therapy in a comprehensive inpatient rehab setting.  Physiatrist is providing close team supervision and 24 hour management of active medical problems listed below.  Physiatrist and rehab team continue to assess barriers to  discharge/monitor patient progress toward functional and medical goals  Care Tool:  Bathing    Body parts bathed by patient: Chest, Abdomen, Front perineal area, Buttocks, Right upper leg, Left upper leg, Right lower leg, Right arm, Face   Body parts bathed by helper: Left arm, Left lower leg     Bathing assist Assist Level: Minimal Assistance - Patient > 75%     Upper Body Dressing/Undressing Upper body dressing   What is the patient wearing?: Pull over shirt    Upper body assist Assist Level: Moderate Assistance - Patient 50 - 74%    Lower Body Dressing/Undressing Lower body dressing      What is the patient wearing?: Pants     Lower body assist Assist for lower body dressing: Moderate Assistance - Patient 50 - 74%     Toileting Toileting    Toileting assist Assist for toileting: Moderate Assistance - Patient 50 - 74%     Transfers Chair/bed transfer  Transfers assist     Chair/bed transfer assist level: Minimal Assistance - Patient > 75%     Locomotion Ambulation   Ambulation assist      Assist level: Minimal Assistance - Patient > 75% Assistive device: Walker-rolling Max distance: 100'   Walk 10 feet activity   Assist     Assist level: Minimal Assistance - Patient > 75% Assistive device: Walker-rolling   Walk 50 feet activity   Assist Walk 50 feet with 2 turns activity did not occur: Safety/medical concerns  Assist level: Minimal Assistance - Patient > 75% Assistive device: Walker-rolling    Walk 150 feet activity   Assist Walk 150 feet activity  did not occur: Safety/medical concerns         Walk 10 feet on uneven surface  activity   Assist Walk 10 feet on uneven surfaces activity did not occur: Safety/medical concerns         Wheelchair     Assist Will patient use wheelchair at discharge?: (TBD) Type of Wheelchair: Manual    Wheelchair assist level: Minimal Assistance - Patient > 75% Max wheelchair distance:  100'    Wheelchair 50 feet with 2 turns activity    Assist        Assist Level: Minimal Assistance - Patient > 75%   Wheelchair 150 feet activity     Assist     Assist Level: Minimal Assistance - Patient > 75%     Medical Problem List and Plan: 1.  Deficits with mobility, transfers, self-care, speech, swallowing secondary to multiple right infarcts acute sub acute in MCA as well as MCA/ACA watershed distribution and remote hemorrhagic infarct right thalamic.    Cont CIR  WHO ordered 2.  DVT Prophylaxis/Anticoagulation: Pharmaceutical: Lovenox 3. Neck/shoulder pain/Pain Management: Muscle strain due to left inattention. Added K pad in addition to Sportscreme for local measures.  4. Mood: LCSW to follow for evaluation and support.  5. Neuropsych: This patient is not fully capable of making decisions on his own behalf. 6. Skin/Wound Care: Routine pressure relief measures.  7. Fluids/Electrolytes/Nutrition: Monitor I/O. Nocturnal hydration.  8.  Essential hypertension: continue Norvasc and lopressor--avoid hypotension.   Increased amlodipine to 10mg  qd Vitals:   01/15/18 0435 01/15/18 1317  BP: 140/81 106/76  Pulse: 79 73  Resp: 19 19  Temp: 98 F (36.7 C) 98.1 F (36.7 C)  SpO2: 98% 99%   Labile on 11/16 9.  CAD: Continue aspirin, lopressor and statin.  Resumed beta-blocker as blood pressures stabilized. 10.  History of gout: Continue allopurinol 11. Chronic renal failure: Baseline serum creatinine at 1.4?  improved with hydration.   Creatinine 1.25 on 11/13  Labs ordered for Monday  Encourage fluids 12.  Anemia of chronic disease: Monitor for now  Hemoglobin 11.5 on 11/13  Labs ordered for Monday  Continue to monitor 13. H/o peripheral edema: has 2-3+ edema at baseline. Added TEDs.   Lasix started on 11/15 14.  Fever: Resolved  aymptomatic, intermittent urinary incont , resolved, urine culture no growth  LOS: 10 days A FACE TO FACE EVALUATION WAS  PERFORMED  Hana Trippett Lorie Phenix 01/15/2018, 3:49 PM

## 2018-01-16 ENCOUNTER — Inpatient Hospital Stay (HOSPITAL_COMMUNITY): Payer: Medicare Other | Admitting: Occupational Therapy

## 2018-01-16 ENCOUNTER — Inpatient Hospital Stay (HOSPITAL_COMMUNITY): Payer: Medicare Other | Admitting: Physical Therapy

## 2018-01-16 DIAGNOSIS — D638 Anemia in other chronic diseases classified elsewhere: Secondary | ICD-10-CM

## 2018-01-16 NOTE — Progress Notes (Signed)
Occupational Therapy Session Note  Patient Details  Name: RANULFO KALL MRN: [id number] Date of Birth: 1941/02/07  Today's Date: 01/16/2018 OT Group Time: 1100-1200 OT Group Time Calculation (min): 60 min  Skilled Therapeutic Interventions/Progress Updates:    Pt engaged in therapeutic w/c level dance group focusing on patient choice, UE/LE strengthening, salience, activity tolerance, and social participation. Pt was guided through various dance-based exercises involving UEs/LEs and trunk. All music was selected by group members. Emphasis placed on Lt visual scanning and Lt NMR. Pt required setup and tactile cues to involve L UE into dancing tasks via self ROM. Able to actively incorporate L LE, and with fatigue, he initiated use of active assist techniques. Pt also initiated dancing with neighbor on his Lt side, smiling and interacting with her throughout tx. He requested songs and exhibited high levels of participation. At end of session he was taken back to room and left with all needs within reach.   Therapy Documentation Precautions:  Precautions Precautions: Fall Restrictions Weight Bearing Restrictions: No Pain: No s/s pain during tx    ADL: ADL Eating: Set up Grooming: Minimal assistance Where Assessed-Grooming: Sitting at sink, Wheelchair Upper Body Bathing: Minimal assistance Where Assessed-Upper Body Bathing: Shower Lower Body Bathing: Minimal assistance Where Assessed-Lower Body Bathing: Shower Upper Body Dressing: Moderate assistance Where Assessed-Upper Body Dressing: Sitting at sink, Wheelchair Lower Body Dressing: Moderate assistance Where Assessed-Lower Body Dressing: Standing at sink, Wheelchair Toileting: Not assessed Toilet Transfer: Not assessed Social research officer, government: Minimal assistance, Maximal cueing Social research officer, government Method: Wellsite geologist Equipment: Transfer tub bench      Therapy/Group: Group Therapy  Raymundo Rout A  Luka Reisch 01/16/2018, 4:14 PM

## 2018-01-16 NOTE — Progress Notes (Signed)
Physical Therapy Session Note  Patient Details  Name: [name] MRN: [id number] Date of Birth: [birth year]  Today's Date: 01/16/2018 PT Individual Time: 0915-1000 PT Individual Time Calculation (min): 45 min   Short Term Goals: Week 1:  PT Short Term Goal 1 (Week 1): Pt will initiate stair training PT Short Term Goal 1 - Progress (Week 1): Met PT Short Term Goal 2 (Week 1): Pt will ambulate 50' w/ LRAD w/ min assist PT Short Term Goal 2 - Progress (Week 1): Met PT Short Term Goal 3 (Week 1): Pt will participate in 60 min of upright activity w/o increase in fatigue PT Short Term Goal 3 - Progress (Week 1): Met PT Short Term Goal 4 (Week 1): Pt will attend to L environment/body w/ min cues 50% of the time PT Short Term Goal 4 - Progress (Week 1): Met  Skilled Therapeutic Interventions/Progress Updates:  Pt was seen bedside in the am. Pt transferred supine to edge of bed with head of bed elevated, side rail and S with verbal cues and increased time. Pt performed multiple sit to stand and stand pivot transfers with rolling walker, L hand orthosis and min A with verbal cues. Pt propelled w/c to gym about 150 feet with B LEs only to work on reciprocal motion with min A and verbal cues. In gym treatment focused on NMR, utilizing step taps 3 sets x 10 reps each. Pt ambulated 80 feet with rolling walker, L hand orthosis and min A with verbal cues. Pt returned to room following treatment and left sitting up in w/c with chair alarm on and call bell within reach.   Therapy Documentation Precautions:  Precautions Precautions: Fall Restrictions Weight Bearing Restrictions: No General:   Pain: Pain Assessment Pain Scale: 0-10 Pain Score: 0-No pain  Therapy/Group: Individual Therapy  Dub Amis 01/16/2018, 12:11 PM

## 2018-01-16 NOTE — Progress Notes (Signed)
Resting without quietly. Complained of left resting hand splint hurting his hand, requested to be removed. Will reapply later. Sports cream to bilateral shoulders and neck. BLE's with pitting edema, dry, and wrinkled skin. [name] A

## 2018-01-16 NOTE — Progress Notes (Signed)
[facility] PHYSICAL MEDICINE & REHABILITATION PROGRESS NOTE   Subjective/Complaints: Patient seen sitting up in bed this morning sleeping.  He states he slept well overnight.  ROS: Denies CP SOB, N/V/D  Objective:   No results found. No results for input(s): WBC, HGB, HCT, PLT in the last 72 hours. No results for input(s): NA, K, CL, CO2, GLUCOSE, BUN, CREATININE, CALCIUM in the last 72 hours.  Intake/Output Summary (Last 24 hours) at 01/16/2018 0757 Last data filed at 01/16/2018 0400 Gross per 24 hour  Intake 816 ml  Output 1100 ml  Net -284 ml     Physical Exam: Vital Signs Blood pressure 120/80, pulse 92, temperature 98.3 F (36.8 C), temperature source Oral, resp. rate 16, height 6\' 2"  (1.88 m), weight 120.4 kg, SpO2 95 %. Constitutional: NAD.  Obese. HENT: Normocephalic.  Atraumatic. Eyes: EOMI.  No discharge. Left ptosis  Cardiovascular: RRR.  No JVD. Respiratory: Effort normal and breath sounds normal.  GI: Bowel sounds are normal.  Nondistended. Musculoskeletal: Lower extremity edema, unchanged Neurological: He is alert and oriented.  Left facial weakness with dysarthria.  Left inattention with left visual field deficits.  Motor: RUE: 5/5 proximal distal RLE: 5/5 proximal to distal LUE: Shoulder abduction 2-/5, elbow flexion 2-/5, elbow extension 1/5, distally trace LLE: 4+/5 proximal distal, unchanged  Skin: Bilateral shins with stasis changes and wrinkling.    Sternum with healed scar Psychiatric: He has a normal mood and affect. His behavior is normal.   Assessment/Plan: 1. Functional deficits secondary to Right MCA infarct with Left hemiparesis, dysarthria and Dysphagia which require 3+ hours per day of interdisciplinary therapy in a comprehensive inpatient rehab setting.  Physiatrist is providing close team supervision and 24 hour management of active medical problems listed below.  Physiatrist and rehab team continue to assess barriers to  discharge/monitor patient progress toward functional and medical goals  Care Tool:  Bathing    Body parts bathed by patient: Chest, Abdomen, Front perineal area, Buttocks, Right upper leg, Left upper leg, Right lower leg, Right arm, Face   Body parts bathed by helper: Left arm, Left lower leg     Bathing assist Assist Level: Minimal Assistance - Patient > 75%     Upper Body Dressing/Undressing Upper body dressing   What is the patient wearing?: Pull over shirt    Upper body assist Assist Level: Moderate Assistance - Patient 50 - 74%    Lower Body Dressing/Undressing Lower body dressing      What is the patient wearing?: Pants     Lower body assist Assist for lower body dressing: Moderate Assistance - Patient 50 - 74%     Toileting Toileting    Toileting assist Assist for toileting: Moderate Assistance - Patient 50 - 74%     Transfers Chair/bed transfer  Transfers assist     Chair/bed transfer assist level: Minimal Assistance - Patient > 75%     Locomotion Ambulation   Ambulation assist      Assist level: Minimal Assistance - Patient > 75% Assistive device: Walker-rolling Max distance: 100'   Walk 10 feet activity   Assist     Assist level: Minimal Assistance - Patient > 75% Assistive device: Walker-rolling   Walk 50 feet activity   Assist Walk 50 feet with 2 turns activity did not occur: Safety/medical concerns  Assist level: Minimal Assistance - Patient > 75% Assistive device: Walker-rolling    Walk 150 feet activity   Assist Walk 150 feet activity did not occur:  Safety/medical concerns         Walk 10 feet on uneven surface  activity   Assist Walk 10 feet on uneven surfaces activity did not occur: Safety/medical concerns         Wheelchair     Assist Will patient use wheelchair at discharge?: (TBD) Type of Wheelchair: Manual    Wheelchair assist level: Minimal Assistance - Patient > 75% Max wheelchair distance:  100'    Wheelchair 50 feet with 2 turns activity    Assist        Assist Level: Minimal Assistance - Patient > 75%   Wheelchair 150 feet activity     Assist     Assist Level: Minimal Assistance - Patient > 75%     Medical Problem List and Plan: 1.  Deficits with mobility, transfers, self-care, speech, swallowing secondary to multiple right infarcts acute sub acute in MCA as well as MCA/ACA watershed distribution and remote hemorrhagic infarct right thalamic.    Cont CIR  WHO ordered 2.  DVT Prophylaxis/Anticoagulation: Pharmaceutical: Lovenox 3. Neck/shoulder pain/Pain Management: Muscle strain due to left inattention. Added K pad in addition to Sportscreme for local measures.  4. Mood: LCSW to follow for evaluation and support.  5. Neuropsych: This patient is not fully capable of making decisions on his own behalf. 6. Skin/Wound Care: Routine pressure relief measures.  7. Fluids/Electrolytes/Nutrition: Monitor I/O. Nocturnal hydration.  8.  Essential hypertension: continue Norvasc and lopressor--avoid hypotension.   Increased amlodipine to 10mg  qd Vitals:   01/15/18 2031 01/16/18 0430  BP: (!) 146/85 120/80  Pulse: 74 92  Resp: 20 16  Temp: 98 F (36.7 C) 98.3 F (36.8 C)  SpO2: 100% 95%   Labile, but overall controlled on 11/17 9.  CAD: Continue aspirin, lopressor and statin.  Resumed beta-blocker as blood pressures stabilized. 10.  History of gout: Continue allopurinol 11. Chronic renal failure: Baseline serum creatinine at 1.4?  improved with hydration.   Creatinine 1.25 on 11/13  Labs ordered for tomorrow  Encourage fluids 12.  Anemia of chronic disease: Monitor for now  Hemoglobin 11.5 on 11/13  Labs ordered for tomorrow  Continue to monitor 13. H/o peripheral edema: has 2-3+ edema at baseline. Added TEDs.   Lasix started on 11/15 14.  Fever: Resolved  aymptomatic, intermittent urinary incont , resolved, urine culture no growth  LOS: 11 days A  FACE TO FACE EVALUATION WAS PERFORMED  Cohl Behrens Lorie Phenix 01/16/2018, 7:57 AM

## 2018-01-17 ENCOUNTER — Ambulatory Visit: Payer: Medicare Other

## 2018-01-17 ENCOUNTER — Inpatient Hospital Stay (HOSPITAL_COMMUNITY): Payer: Medicare Other

## 2018-01-17 ENCOUNTER — Ambulatory Visit (HOSPITAL_COMMUNITY): Payer: Medicare Other | Admitting: Speech Pathology

## 2018-01-17 ENCOUNTER — Inpatient Hospital Stay (HOSPITAL_COMMUNITY): Payer: Self-pay

## 2018-01-17 LAB — BASIC METABOLIC PANEL
Anion gap: 4 — ABNORMAL LOW (ref 5–15)
BUN: 13 mg/dL (ref 8–23)
CALCIUM: 10.1 mg/dL (ref 8.9–10.3)
CO2: 28 mmol/L (ref 22–32)
CREATININE: 1.37 mg/dL — AB (ref 0.61–1.24)
Chloride: 108 mmol/L (ref 98–111)
GFR calc non Af Amer: 49 mL/min — ABNORMAL LOW (ref >60)
GFR, EST AFRICAN AMERICAN: 57 mL/min — AB (ref >60)
Glucose, Bld: 88 mg/dL (ref 70–99)
Potassium: 4.5 mmol/L (ref 3.5–5.1)
SODIUM: 140 mmol/L (ref 135–145)

## 2018-01-17 NOTE — Progress Notes (Signed)
Speech Language Pathology Daily Session Note  Patient Details  Name: [name] MRN: [id number] Date of Birth: 10-22-1940  Today's Date: 01/17/2018 SLP Individual Time: 1100-1200 SLP Individual Time Calculation (min): 60 min  Short Term Goals: Week 2: SLP Short Term Goal 1 (Week 2): Patient will consume current diet with minimal overt s/s of aspiration and Min verbal cues for use of swallowing strategies.  SLP Short Term Goal 2 (Week 2): Patient will attend to left field of enviornment/body during functional tasks with Min A verbal cues.  SLP Short Term Goal 3 (Week 2): Patient will demonstrate basic problem solving for functional and familiar tasks with Min A verbal cues.  SLP Short Term Goal 4 (Week 2): Patient will demonstrate sustained attention to tasks for 10 minutes with Min A verbal cues for redirection.  SLP Short Term Goal 5 (Week 2): Patient will utilize speech intelligibility strategies at the sentence level to achieve ~75% intelligibility with Min A verbal cues.   Skilled Therapeutic Interventions:  Skilled treatment session focused on dysphagia and cognition goals. SLP facilitated session by providing skilled observation of pt consuming trial dysphagia 2 lunch tray with thin liquids via Provale cup. Pt states that he wants to wear lower dentures (gums no longer sore). Denture adhesive applied to both upper and lower dentures. SLP gave initial cue/reminder to place all food on his right. Pt with only minimal left pocketing that he cleared without cues. Pt with clear voice throughout. Pt with great progress this session and is appropriate for diet upgrade. Education provided to nursing. Pt was returned to room, left upright in wheelchair, lap belt alarm on and all needs within reach. Continue per current plan of care.      Pain Pain Assessment Pain Scale: 0-10 Pain Score: 0-No pain  Therapy/Group: Individual Therapy  Derrall Hicks 01/17/2018, 11:44 AM

## 2018-01-17 NOTE — Progress Notes (Signed)
Physical Therapy Session Note  Patient Details  Name: DENZIL MCEACHRON MRN: [id number] Date of Birth: Aug 24, 1940  Today's Date: 01/17/2018 PT Individual Time: 1300-1400 PT Individual Time Calculation (min): 60 min   Short Term Goals: Week 2:  PT Short Term Goal 1 (Week 2): =LTGs due to ELOS  Skilled Therapeutic Interventions/Progress Updates:    Pt seated in recliner upon PT arrival, agreeable to therapy tx and denies pain. Pt performed sit<>stand from recliner with mod assist and stand pivot to the w/c with min assist and verbal cues for techniques. Pt transported to the gym. Pt ambulated x 50 ft with RW and min assist, verbal cues for L attention and increased L step length. Pt performed 2 x 10 sit<>stands from mat without UE support working on LE strengthening and endurance, CGA. Pt performed x 10 sit<>stands with dynadisc under R LE for increased weightbearing through L LE and neuro re-ed, min assist. Pt ambulated from gym>rehab apartment x 60 ft with RW and min assist, verbal cues for AD management and L awareness. Pt performed transfer on/off bed with min assist and bed mobility with min assist, verba cues for safety. Pt ambulated x 15 ft within rehab apartment and RW, performed transfer on/off recliner with mod assist to stand from low surface. Pt ambulated back to gym with RW and min assist x 60 ft. Pt worked on dynamic standing balance to perform toe taps on 2 inch step without UE support, min-mod assist. Pt transferred to w/c min assist stand pivot. Pt transported back to room and ambulated in/out of bathroom with min assist using RW, min assit for standing balance while pt performs peri care and clothing management. Pt transferred to bed with min assist and left supine with needs in reach and bed alarm set.   Therapy Documentation Precautions:  Precautions Precautions: Fall Restrictions Weight Bearing Restrictions: No    Therapy/Group: Individual Therapy  Netta Corrigan, PT,  DPT 01/17/2018, 7:58 AM

## 2018-01-17 NOTE — Progress Notes (Signed)
Occupational Therapy Session Note  Patient Details  Name: [name] MRN: [id number] Date of Birth: May 25, 1940  Today's Date: 01/17/2018 OT Individual Time: 8333-8329 OT Individual Time Calculation (min): 75 min    Short Term Goals: Week 2:  OT Short Term Goal 1 (Week 2): Pt will perform toilet transfers with min A stand pivot OT Short Term Goal 2 (Week 2): Pt will perform toileting tasks with mod A OT Short Term Goal 3 (Week 2): Pt will completed LB dressing tasks with min A OT Short Term Goal 4 (Week 2): Pt will perform UB dressing tasks with min A  Skilled Therapeutic Interventions/Progress Updates:    OT intervention with focus on bed mobility, sit<>stand, standing balance, functional amb with RW, BADL retraining, attention to L, task initiation, sequencing, activity tolerance, and safety awareness to increase independence with BADLs.  Pt sat EOB with supervision using bed rails to assist.  Pt performed sit<>stand and functional amb with RW to bathroom with min A and mod verbal cues to advance LLE during ambulation.  Pt requires min verbal cues for task initiation and sequencing during bathing tasks with some perseveration noted requiring min verbal cues to redirect.  Pt requires min verbal cues for recalling hemi dressing techniques.  See below for assist levels. Pt requires steady A when standing at sink to pull up pants.  Pt remained in w/c with belt alarm activated, half lap tray in place, and all needs within reach.   Therapy Documentation Precautions:  Precautions Precautions: Fall Restrictions Weight Bearing Restrictions: No Pain: Pain Assessment Pain Scale: 0-10 Pain Score: 0-No pain ADL: ADL Eating: Set up Grooming: Minimal assistance Where Assessed-Grooming: Sitting at sink, Wheelchair Upper Body Bathing: Minimal assistance Where Assessed-Upper Body Bathing: Shower Lower Body Bathing: Minimal assistance Where Assessed-Lower Body Bathing: Shower Upper Body  Dressing: Moderate assistance Where Assessed-Upper Body Dressing: Sitting at sink, Wheelchair Lower Body Dressing: Moderate assistance Where Assessed-Lower Body Dressing: Standing at sink, Wheelchair Toileting: Not assessed Toilet Transfer: Not assessed Social research officer, government: Minimal assistance, Maximal cueing Social research officer, government Method: Heritage manager: Radio broadcast assistant   Therapy/Group: Individual Therapy  Leroy Libman 01/17/2018, 11:35 AM

## 2018-01-17 NOTE — Progress Notes (Signed)
Social Work Patient ID: [name], male   DOB: [birth year], 77 y.o.   MRN: [id number] Met with pt and wife have asked wife to come in and observe pt in therapies in preparation of discharge home on Sat. She can be here Wed at 1:00 and will see PT session spoke with Emily-PT regarding this. The can have her come in Thursday or Friday to do hands on care. Concern is her ability to provide the care he requires and they live in a split level home. Work on discharge plans.

## 2018-01-17 NOTE — Progress Notes (Signed)
[facility] PHYSICAL MEDICINE & REHABILITATION PROGRESS NOTE   Subjective/Complaints:  No issues overnite, remembers OT sesssion, oriented to person place and time  ROS: Denies CP SOB, N/V/D  Objective:   No results found. No results for input(s): WBC, HGB, HCT, PLT in the last 72 hours. No results for input(s): NA, K, CL, CO2, GLUCOSE, BUN, CREATININE, CALCIUM in the last 72 hours.  Intake/Output Summary (Last 24 hours) at 01/17/2018 0849 Last data filed at 01/17/2018 0800 Gross per 24 hour  Intake 802 ml  Output 300 ml  Net 502 ml     Physical Exam: Vital Signs Blood pressure 133/81, pulse 67, temperature 98.5 F (36.9 C), resp. rate 20, height 6\' 2"  (1.88 m), weight 120.4 kg, SpO2 98 %. Constitutional: NAD.  Obese. HENT: Normocephalic.  Atraumatic. Eyes: EOMI.  No discharge. Left ptosis  Cardiovascular: RRR.  No JVD. Respiratory: Effort normal and breath sounds normal.  GI: Bowel sounds are normal.  Nondistended. Musculoskeletal: Lower extremity edema, unchanged Neurological: He is alert and oriented.  Left facial weakness with dysarthria.  Left inattention with left visual field deficits.  Motor: RUE: 5/5 proximal distal RLE: 5/5 proximal to distal LUE: Shoulder abduction 2-/5, elbow flexion 2-/5, elbow extension 1/5, distally trace LLE: 4+/5 proximal distal, unchanged  Skin: Bilateral shins with stasis changes and wrinkling.    Sternum with healed scar Psychiatric: He has a normal mood and affect. His behavior is normal.   Assessment/Plan: 1. Functional deficits secondary to Right MCA infarct with Left hemiparesis, dysarthria and Dysphagia which require 3+ hours per day of interdisciplinary therapy in a comprehensive inpatient rehab setting.  Physiatrist is providing close team supervision and 24 hour management of active medical problems listed below.  Physiatrist and rehab team continue to assess barriers to discharge/monitor patient progress toward functional  and medical goals  Care Tool:  Bathing    Body parts bathed by patient: Chest, Abdomen, Front perineal area, Buttocks, Right upper leg, Left upper leg, Right lower leg, Right arm, Face   Body parts bathed by helper: Left arm, Left lower leg     Bathing assist Assist Level: Minimal Assistance - Patient > 75%     Upper Body Dressing/Undressing Upper body dressing   What is the patient wearing?: Pull over shirt    Upper body assist Assist Level: Moderate Assistance - Patient 50 - 74%    Lower Body Dressing/Undressing Lower body dressing      What is the patient wearing?: Pants     Lower body assist Assist for lower body dressing: Moderate Assistance - Patient 50 - 74%     Toileting Toileting    Toileting assist Assist for toileting: Moderate Assistance - Patient 50 - 74%     Transfers Chair/bed transfer  Transfers assist     Chair/bed transfer assist level: Minimal Assistance - Patient > 75%     Locomotion Ambulation   Ambulation assist      Assist level: Minimal Assistance - Patient > 75% Assistive device: Walker-rolling Max distance: 75   Walk 10 feet activity   Assist     Assist level: Minimal Assistance - Patient > 75% Assistive device: Walker-rolling   Walk 50 feet activity   Assist Walk 50 feet with 2 turns activity did not occur: Safety/medical concerns  Assist level: Minimal Assistance - Patient > 75% Assistive device: Walker-rolling    Walk 150 feet activity   Assist Walk 150 feet activity did not occur: Safety/medical concerns  Walk 10 feet on uneven surface  activity   Assist Walk 10 feet on uneven surfaces activity did not occur: Safety/medical concerns         Wheelchair     Assist Will patient use wheelchair at discharge?: (TBD) Type of Wheelchair: Manual    Wheelchair assist level: Minimal Assistance - Patient > 75% Max wheelchair distance: 150    Wheelchair 50 feet with 2 turns  activity    Assist        Assist Level: Minimal Assistance - Patient > 75%   Wheelchair 150 feet activity     Assist     Assist Level: Minimal Assistance - Patient > 75%     Medical Problem List and Plan: 1.  Deficits with mobility, transfers, self-care, speech, swallowing secondary to multiple right infarcts acute sub acute in MCA as well as MCA/ACA watershed distribution and remote hemorrhagic infarct right thalamic.    Cont CIR PT, OT, SLP  WHO ordered 2.  DVT Prophylaxis/Anticoagulation: Pharmaceutical: Lovenox 3. Neck/shoulder pain/Pain Management: Muscle strain due to left inattention. Added K pad in addition to Sportscreme for local measures.  4. Mood: LCSW to follow for evaluation and support.  5. Neuropsych: This patient is not fully capable of making decisions on his own behalf. 6. Skin/Wound Care: Routine pressure relief measures.  7. Fluids/Electrolytes/Nutrition: Monitor I/O. Nocturnal hydration.  8.  Essential hypertension: continue Norvasc and lopressor--avoid hypotension.   Increased amlodipine to 10mg  qd Vitals:   01/16/18 2011 01/17/18 0553  BP: (!) 155/93 133/81  Pulse: 74 67  Resp: 20 20  Temp: 98.2 F (36.8 C) 98.5 F (36.9 C)  SpO2: 99% 98%   Labile, but overall controlled on 11/18 9.  CAD: Continue aspirin, lopressor and statin.  Resumed beta-blocker as blood pressures stabilized. 10.  History of gout: Continue allopurinol 11. Chronic renal failure: Baseline serum creatinine at 1.4?  improved with hydration.   Creatinine 1.25 on 11/13  Labs ordered for today  Encourage fluids 12.  Anemia of chronic disease: Monitor for now  Hemoglobin 11.5 on 11/13  Labs ordered for 11/20  Continue to monitor 13. H/o peripheral edema: has 2-3+ edema at baseline. Added TEDs.   Lasix started on 11/15 14.  Fever: Resolved  aymptomatic, intermittent urinary incont , resolved, urine culture no growth  LOS: 12 days A FACE TO FACE EVALUATION WAS  PERFORMED  Charlett Blake 01/17/2018, 8:49 AM

## 2018-01-18 ENCOUNTER — Inpatient Hospital Stay (HOSPITAL_COMMUNITY): Payer: Self-pay

## 2018-01-18 ENCOUNTER — Inpatient Hospital Stay (HOSPITAL_COMMUNITY): Payer: Medicare Other

## 2018-01-18 MED ORDER — FUROSEMIDE 20 MG PO TABS
10.0000 mg | ORAL_TABLET | Freq: Every day | ORAL | Status: DC
  Administered 2018-01-18 – 2018-01-22 (×5): 10 mg via ORAL
  Filled 2018-01-18 (×4): qty 1

## 2018-01-18 NOTE — Progress Notes (Signed)
Physical Therapy Session Note  Patient Details  Name: [name] MRN: [id number] Date of Birth: 15-Oct-1940  Today's Date: 01/18/2018 PT Individual Time: 1300-1355 PT Individual Time Calculation (min): 55 min   Short Term Goals: Week 2:  PT Short Term Goal 1 (Week 2): =LTGs due to ELOS  Skilled Therapeutic Interventions/Progress Updates:    Pt supine in bed asleep upon PT arrival, pt awakens to verbal/tactile stimuli. Pt agreeable to therapy tx and denies pain. Pt transferred from supine>sitting EOB with min assist and verbal cues for techniques. Pt performed stand pivot to w/c with min assist. Pt transported to the gym. Pt ambulated 2 x 60 ft with RW and min assist working on activity tolerance and endurance, verbal cues for RW management and increased L step length. Pt performed 2 x 10 sit<>stands from w/c without UE support working on LE strength and L LE NMR. Pt worked on dynamic standing balance while tossing horseshoes, x 2 trials without UE support, min assist. Pt seated in w/c worked on L active assisted elbow flexion and resisted shoulder flexion/extension, working on L UE neuro re-ed. Pt transported to dyaroom, pt ambulated to nustep and used nustep x 6 minutes on workload 5 for global strengthening and L NMR. Pt transported back to room at end of session and left seated in w/c with chair alarm set.   Therapy Documentation Precautions:  Precautions Precautions: Fall Restrictions Weight Bearing Restrictions: No    Therapy/Group: Individual Therapy  Netta Corrigan, PT, DPT 01/18/2018, 7:47 AM

## 2018-01-18 NOTE — Progress Notes (Signed)
[facility] PHYSICAL MEDICINE & REHABILITATION PROGRESS NOTE   Subjective/Complaints:  Denies leg swelling, took lasix at home, pt states he doesn't drink much due to swallowing issues after CVA  ROS: Denies CP SOB, N/V/D  Objective:   No results found. No results for input(s): WBC, HGB, HCT, PLT in the last 72 hours. Recent Labs    01/17/18 1106  NA 140  K 4.5  CL 108  CO2 28  GLUCOSE 88  BUN 13  CREATININE 1.37*  CALCIUM 10.1    Intake/Output Summary (Last 24 hours) at 01/18/2018 0813 Last data filed at 01/18/2018 0400 Gross per 24 hour  Intake 600 ml  Output 900 ml  Net -300 ml     Physical Exam: Vital Signs Blood pressure (!) 148/72, pulse 68, temperature 98.6 F (37 C), temperature source Oral, resp. rate 18, height 6\' 2"  (1.88 m), weight 120.4 kg, SpO2 100 %. Constitutional: NAD.  Obese. HENT: Normocephalic.  Atraumatic. Eyes: EOMI.  No discharge. Left ptosis  Cardiovascular: RRR.  No JVD. Respiratory: Effort normal and breath sounds normal.  GI: Bowel sounds are normal.  Nondistended. Musculoskeletal: Lower extremity edema, unchanged Neurological: He is alert and oriented.  Left facial weakness with dysarthria.  Left inattention with left visual field deficits.  Motor: RUE: 5/5 proximal distal RLE: 5/5 proximal to distal LUE: Shoulder abduction 2-/5, elbow flexion 2-/5, elbow extension 1/5, distally trace LLE: 4+/5 proximal distal, unchanged  Skin: Bilateral shins with stasis changes and wrinkling.    Sternum with healed scar Psychiatric: He has a normal mood and affect. His behavior is normal.   Assessment/Plan: 1. Functional deficits secondary to Right MCA infarct with Left hemiparesis, dysarthria and Dysphagia which require 3+ hours per day of interdisciplinary therapy in a comprehensive inpatient rehab setting.  Physiatrist is providing close team supervision and 24 hour management of active medical problems listed below.  Physiatrist and rehab  team continue to assess barriers to discharge/monitor patient progress toward functional and medical goals  Care Tool:  Bathing    Body parts bathed by patient: Chest, Abdomen, Front perineal area, Buttocks, Right upper leg, Left upper leg, Right lower leg, Right arm, Face   Body parts bathed by helper: Left arm, Left lower leg     Bathing assist Assist Level: Minimal Assistance - Patient > 75%     Upper Body Dressing/Undressing Upper body dressing   What is the patient wearing?: Pull over shirt    Upper body assist Assist Level: Moderate Assistance - Patient 50 - 74%    Lower Body Dressing/Undressing Lower body dressing      What is the patient wearing?: Pants     Lower body assist Assist for lower body dressing: Moderate Assistance - Patient 50 - 74%     Toileting Toileting    Toileting assist Assist for toileting: Moderate Assistance - Patient 50 - 74%     Transfers Chair/bed transfer  Transfers assist     Chair/bed transfer assist level: Minimal Assistance - Patient > 75%     Locomotion Ambulation   Ambulation assist      Assist level: Minimal Assistance - Patient > 75% Assistive device: Walker-rolling Max distance: 60 ft   Walk 10 feet activity   Assist     Assist level: Minimal Assistance - Patient > 75% Assistive device: Walker-rolling   Walk 50 feet activity   Assist Walk 50 feet with 2 turns activity did not occur: Safety/medical concerns  Assist level: Minimal Assistance - Patient >  75% Assistive device: Walker-rolling    Walk 150 feet activity   Assist Walk 150 feet activity did not occur: Safety/medical concerns         Walk 10 feet on uneven surface  activity   Assist Walk 10 feet on uneven surfaces activity did not occur: Safety/medical concerns         Wheelchair     Assist Will patient use wheelchair at discharge?: (TBD) Type of Wheelchair: Manual    Wheelchair assist level: Minimal Assistance -  Patient > 75% Max wheelchair distance: 150    Wheelchair 50 feet with 2 turns activity    Assist        Assist Level: Minimal Assistance - Patient > 75%   Wheelchair 150 feet activity     Assist     Assist Level: Minimal Assistance - Patient > 75%     Medical Problem List and Plan: 1.  Deficits with mobility, transfers, self-care, speech, swallowing secondary to multiple right infarcts acute sub acute in MCA as well as MCA/ACA watershed distribution and remote hemorrhagic infarct right thalamic.    Cont CIR PT, OT, SLP  WHO ordered 2.  DVT Prophylaxis/Anticoagulation: Pharmaceutical: Lovenox 3. Neck/shoulder pain/Pain Management: Muscle strain due to left inattention. Added K pad in addition to Sportscreme for local measures.  4. Mood: LCSW to follow for evaluation and support.  5. Neuropsych: This patient is not fully capable of making decisions on his own behalf. 6. Skin/Wound Care: Routine pressure relief measures.  7. Fluids/Electrolytes/Nutrition: Monitor I/O. Intake 840 yesterday 8.  Essential hypertension: continue Norvasc and lopressor--avoid hypotension.   Increased amlodipine to 10mg  qd Vitals:   01/17/18 1948 01/18/18 0502  BP: (!) 134/95 (!) 148/72  Pulse: 90 68  Resp: 18 18  Temp: 98 F (36.7 C) 98.6 F (37 C)  SpO2: 100% 100%   Labile, but overall controlled on 11/18 9.  CAD: Continue aspirin, lopressor and statin.  Resumed beta-blocker as blood pressures stabilized. 10.  History of gout: Continue allopurinol 11. Chronic renal failure: Baseline serum creatinine at 1.4?  improved with hydration.   Creatinine 1.25 on 11/13, 1.37 on 11/18  Likely elevated due to Lasix  Encourage fluids 12.  Anemia of chronic disease: Monitor for now  Hemoglobin 11.5 on 11/13  Labs ordered for 11/20  Continue to monitor 13. H/o peripheral edema: has 2-3+ edema at baseline. Added TEDs.   Lasix started on 11/15 , no edema currently but intake has been low , creat  creeping up will reduce dose to 10mg  14.  Fever: Resolved  aymptomatic, intermittent urinary incont , resolved, urine culture no growth  LOS: 13 days A FACE TO FACE EVALUATION WAS PERFORMED  Charlett Blake 01/18/2018, 8:13 AM

## 2018-01-18 NOTE — Progress Notes (Signed)
Occupational Therapy Session Note  Patient Details  Name: [name] MRN: [id number] Date of Birth: 1940-07-14  Today's Date: 01/18/2018 OT Individual Time: 7939-0300 OT Individual Time Calculation (min): 75 min    Short Term Goals: Week 2:  OT Short Term Goal 1 (Week 2): Pt will perform toilet transfers with min A stand pivot OT Short Term Goal 2 (Week 2): Pt will perform toileting tasks with mod A OT Short Term Goal 3 (Week 2): Pt will completed LB dressing tasks with min A OT Short Term Goal 4 (Week 2): Pt will perform UB dressing tasks with min A  Skilled Therapeutic Interventions/Progress Updates:    OT intervention with focus on BADL retraining (see below), functional amb with RW, standing balance, activity toleance, and safety awareness to increase independence with BADLs. Pt continues to require max multimodal cues for hemi dressing techniques.  Pt requires min A for standing balance for LB bathing/dressing tasks. Pt does not initiate use of LUE in functional tasks and requires tot A for use of LUE as gross stabilizer. Pt remained in w/c with belt alarm activated, half lap tray in place, and all needs within reach.   Therapy Documentation Precautions:  Precautions Precautions: Fall Restrictions Weight Bearing Restrictions: No  Pain: Pain Assessment Pain Scale: 0-10 Pain Score: 0-No pain ADL: ADL Eating: Set up Grooming: Minimal assistance Where Assessed-Grooming: Sitting at sink, Wheelchair Upper Body Bathing: Minimal assistance Where Assessed-Upper Body Bathing: Shower Lower Body Bathing: Minimal assistance Where Assessed-Lower Body Bathing: Shower Upper Body Dressing: Moderate assistance Where Assessed-Upper Body Dressing: Sitting at sink, Wheelchair Lower Body Dressing: Moderate assistance Where Assessed-Lower Body Dressing: Standing at sink, Wheelchair Toileting: Not assessed Toilet Transfer: Not assessed Social research officer, government: Minimal assistance, Maximal  cueing Social research officer, government Method: Heritage manager: Radio broadcast assistant   Therapy/Group: Individual Therapy  Leroy Libman 01/18/2018, 12:24 PM

## 2018-01-18 NOTE — Progress Notes (Signed)
Speech Language Pathology Daily Session Note  Patient Details  Name: [name] MRN: [id number] Date of Birth: 1940-12-12  Today's Date: 01/18/2018 SLP Individual Time: 0900-1000 SLP Individual Time Calculation (min): 60 min  Short Term Goals: Week 2: SLP Short Term Goal 1 (Week 2): Patient will consume current diet with minimal overt s/s of aspiration and Min verbal cues for use of swallowing strategies.  SLP Short Term Goal 2 (Week 2): Patient will attend to left field of enviornment/body during functional tasks with Min A verbal cues.  SLP Short Term Goal 3 (Week 2): Patient will demonstrate basic problem solving for functional and familiar tasks with Min A verbal cues.  SLP Short Term Goal 4 (Week 2): Patient will demonstrate sustained attention to tasks for 10 minutes with Min A verbal cues for redirection.  SLP Short Term Goal 5 (Week 2): Patient will utilize speech intelligibility strategies at the sentence level to achieve ~75% intelligibility with Min A verbal cues.   Skilled Therapeutic Interventions: Skilled treatment session focused on dysphagia and cognitive goals. Patient consumed trials of thin liquids via cup without overt s/s of aspiration and was overall Mod I for use of small sips. Recommend trials of thin liquids via cup with lunch tray prior to upgrade. SLP also facilitated session by providing Min-Mod A verbal cues for problem solving and recall of procedures to a novel card task. However, patient was overall Mod I for selective attention to task for ~45 minutes with Mod I. SLP also initiated a medication management task in which patient required Mod-Max A verbal cues to recall his current medications and their functions. Patient left upright in wheelchair with alarm on and all needs within reach. Continue with current plan of care.      Pain No/Denies Pain   Therapy/Group: Individual Therapy  Teri Legacy 01/18/2018, 2:03 PM

## 2018-01-18 NOTE — Consult Note (Signed)
Neuropsychological Consultation   Patient:   [name]   DOB:   [birth year]  MR Number:  [id number]  Location:  [facility] [place] [id number] [place] [address] Dept: [place]: [phone]           Date of Service:   01/18/2018  Start Time:   3 PM End Time:   4 PM to  Provider/Observer:  Ilean Skill, Psy.D.       Clinical Neuropsychologist       Billing Code/Service: [phone] 4 Units  Chief Complaint:    [name] is a 77 year old male with a history of CAD, DJD, OSA-noncompliant with CPAP, history of DVTs, CVA.  The patient was admitted on 01/01/2018 with acute onset of left facial weakness, dysarthria, left-sided weakness and fall.  MRI showed multiple right CVA.  Multiple right infarcts and remote hemorrhagic infarct right thalamus.  Initially, the patient had issues with lethargy and difficulty controlling swallowing of secretions.  The patient reports that he continues to have left hemiparesis most notably on the left side of his face and left arm.  The patient reports that right side motor functions are within normal limits.  The patient was oriented today and reports that he is in good spirits considering what is happened.  The patient reports that he is having family work on will be needed once he is discharged from the rehab unit.  The patient was able to show good memory and good mental status.  Reason for Service:  HPI:  [name] is a 77 year old male with history of CAD, DJD, OSA--noncompliant with CPAP due to frequency, history of DVTs, CVA; who was admitted on 01/01/2018 with acute onset of left facial weakness, dysarthria, left-sided weakness and fall.  History taken from chart review, family, and patient.  CT perfusion done revealing small acute right frontal/MCA penumbra and CTA head/neck showed moderate cerebral artery atherosclerosis with mid stenosis right-to be origin.   MRI brain reviewed, showing multiple right CVA.  Showed multiple right infarcts and remote hemorrhagic infarct right thalamic.  Patient had issues with lethargy with difficulty handling secretions and MBS done 11/3 with initiation of dysphagia 1 and nectar liquids as well as strict aspiration precautions.  2D echo done revealing EF of 60 to 65% with no wall abnormality and aortic sclerosis without stenosis.  Bilateral lower extreme Dopplers were negative for DVT.  Carotid Dopplers showed large soft plaque in the right ICA bifurcation however no significant stenosis.  He was started on aspirin and Plavix for embolic stroke felt to be from right ICA soft plaque versus cardioembolic source.  TEE done for work-up and showed no evidence of thrombus and negative bubble study.  Loop recorder placed by Dr. Lovena Le.  Current Status:  The patient was oriented today and was able to explain and generalities what it happened.  He was also able to explain some of his past medical issues and describe why he was not always compliant with his CPAP device.  The patient reports that he has continued to struggle with left side motor functioning.  The patient reports that there were no major stressors going on with his current rehab efforts and that he is coping well given the circumstances.  The patient's mental status and memory all appeared to be adequate to be able to benefit and understand current rehabilitative efforts.    Behavioral Observation: [name]  presents as a 77  y.o.-year-old Right African American Male who appeared his stated age. his dress was Appropriate and he was Well Groomed and his manners were Appropriate to the situation.  his participation was indicative of Appropriate and Attentive behaviors.  There were any physical disabilities noted.  he displayed an appropriate level of cooperation and motivation.     Interactions:    Active Appropriate and Attentive  Attention:   within normal limits and  attention span and concentration were age appropriate  Memory:   within normal limits; recent and remote memory intact  Visuo-spatial:  not examined  Speech (Volume):  normal  Speech:   normal;   Thought Process:  Coherent and Relevant  Though Content:  WNL; not suicidal and not homicidal  Orientation:   person, place, time/date and situation  Judgment:   Fair  Planning:   Fair  Affect:    Appropriate  Mood:    Dysphoric  Insight:   Fair  Intelligence:   normal  Medical History:   Past Medical History:  Diagnosis Date  . CAD (coronary artery disease)    s/p cath in 2005 and CABG x4  . Cataract   . DJD (degenerative joint disease)   . DJD (degenerative joint disease)   . Fluid retention    mild  . H/O: gout   . History of blood clots   . HTN (hypertension)   . Hyperlipidemia   . Hyperlipidemia   . Myocardial infarction ([place]) 2009  . Obesity    with reduction  . OSA (obstructive sleep apnea)    AHI 98/hr  . Renal insufficiency   . Sleep apnea   . Stroke [facility]) 2000    Psychiatric History:  Denied any past psychiatric issues, depression or anxiety.  Family Med/Psych History:  Family History  Problem Relation Age of Onset  . Gout Mother   . Stroke Mother   . Coronary artery disease Mother   . Hypertension Mother   . Arthritis Mother   . Coronary artery disease Father   . Arthritis Father   . Gout Father   . Stroke Father   . Hypertension Father   . Coronary artery disease Brother   . Arthritis Brother   . Gout Brother   . Stroke Brother   . Hypertension Brother   . Coronary artery disease Brother   . Arthritis Brother   . Gout Brother   . Stroke Brother   . Hypertension Brother   . Coronary artery disease Unknown        significant in multiple family members    Risk of Suicide/Violence: virtually non-existent the patient denies any suicidal or homicidal ideation.  Impression/DX:  [name] is a 77 year old male with a history of CAD,  DJD, OSA-noncompliant with CPAP, history of DVTs, CVA.  The patient was admitted on 01/01/2018 with acute onset of left facial weakness, dysarthria, left-sided weakness and fall.  MRI showed multiple right CVA.  Multiple right infarcts and remote hemorrhagic infarct right thalamus.  Initially, the patient had issues with lethargy and difficulty controlling swallowing of secretions.  The patient reports that he continues to have left hemiparesis most notably on the left side of his face and left arm.  The patient reports that right side motor functions are within normal limits.  The patient was oriented today and reports that he is in good spirits considering what is happened.  The patient reports that he is having family work on will be needed once he is discharged from  the rehab unit.  The patient was able to show good memory and good mental status.  The patient was oriented today and was able to explain and generalities what it happened.  He was also able to explain some of his past medical issues and describe why he was not always compliant with his CPAP device.  The patient reports that he has continued to struggle with left side motor functioning.  The patient reports that there were no major stressors going on with his current rehab efforts and that he is coping well given the circumstances.  The patient's mental status and memory all appeared to be adequate to be able to benefit and understand current rehabilitative efforts.  Disposition/Plan:  Today, we worked on Therapist, occupational and strategies around dealing with his residual motor deficits and managing and adjusting to ongoing therapeutic efforts to the rehabilitation program.  The patient reports that he understands what is being done and is able to follow the physical and occupational therapies being done.  The patient reports that he feels like he is improving and in particular has improved his left leg functioning.         Electronically  Signed   _______________________ Ilean Skill, Psy.D.

## 2018-01-19 ENCOUNTER — Inpatient Hospital Stay (HOSPITAL_COMMUNITY): Payer: Medicare Other | Admitting: Speech Pathology

## 2018-01-19 ENCOUNTER — Inpatient Hospital Stay (HOSPITAL_COMMUNITY): Payer: Medicare Other

## 2018-01-19 ENCOUNTER — Inpatient Hospital Stay (HOSPITAL_COMMUNITY): Payer: Medicare Other | Admitting: Physical Therapy

## 2018-01-19 ENCOUNTER — Inpatient Hospital Stay (HOSPITAL_COMMUNITY): Payer: Self-pay

## 2018-01-19 LAB — BASIC METABOLIC PANEL
ANION GAP: 8 (ref 5–15)
BUN: 16 mg/dL (ref 8–23)
CALCIUM: 9.8 mg/dL (ref 8.9–10.3)
CO2: 27 mmol/L (ref 22–32)
Chloride: 106 mmol/L (ref 98–111)
Creatinine, Ser: 1.54 mg/dL — ABNORMAL HIGH (ref 0.61–1.24)
GFR, EST AFRICAN AMERICAN: 49 mL/min — AB (ref >60)
GFR, EST NON AFRICAN AMERICAN: 42 mL/min — AB (ref >60)
GLUCOSE: 98 mg/dL (ref 70–99)
Potassium: 4.2 mmol/L (ref 3.5–5.1)
SODIUM: 141 mmol/L (ref 135–145)

## 2018-01-19 LAB — CBC
HEMATOCRIT: 37 % — AB (ref 39.0–52.0)
Hemoglobin: 11.3 g/dL — ABNORMAL LOW (ref 13.0–17.0)
MCH: 26.3 pg (ref 26.0–34.0)
MCHC: 30.5 g/dL (ref 30.0–36.0)
MCV: 86 fL (ref 80.0–100.0)
NRBC: 0 % (ref 0.0–0.2)
Platelets: 242 10*3/uL (ref 150–400)
RBC: 4.3 MIL/uL (ref 4.22–5.81)
RDW: 15.9 % — ABNORMAL HIGH (ref 11.5–15.5)
WBC: 6.3 10*3/uL (ref 4.0–10.5)

## 2018-01-19 NOTE — Progress Notes (Signed)
Orthopedic Tech Progress Note Patient Details:  KORDAE BUONOCORE 01-11-1941 [id number]  Patient ID: VADA YELLEN, male   DOB: 07-Oct-1940, 77 y.o.   MRN: [id number]   [place] Pink 01/19/2018, 12:51 PMCalled [facility] for left toe cap

## 2018-01-19 NOTE — Progress Notes (Signed)
Physical Therapy Session Note  Patient Details  Name: [name] MRN: [id number] Date of Birth: [birth year]  Today's Date: 01/19/2018 PT Individual Time: 1330-1430 PT Individual Time Calculation (min): 60 min   Short Term Goals: Week 2:  PT Short Term Goal 1 (Week 2): =LTGs due to ELOS  Skilled Therapeutic Interventions/Progress Updates:    Pt seated in w/c upon PT arrival, agreeable to therapy tx and denies pain. Pt's wife and daughter present for family education this session. Pt transported from room>gym for time management and energy conservation. Pt ambulated x 80 ft this session with RW and CGA from therapist, cues to L hand placement on RW orthosis and for increased L step length. Family and therapist discussed home set up with 2 steps through front door and then 4 steps to main level, an additional 6 steps to bedroom area. Therapist educated family that the pt will need a R handrail for all step navigation, family reports these will be installed. Pt ascended/descended 4 steps this session with R rail and step to pattern min assist, educated on techniques. Pt transported to ortho gym. Pt ambulated 2 x 30 ft with RW and CGA to the car, performed car transfer with CGA. Therapist providing education for transfer techniques and safety. Pt ambulated to rehab apartment with RW and CGA x 75 ft. Pt transferred on/off rehab apartment bed with min assist, performed bed mobility with min assist and verbal cues for techniques. Pt transferred to w/c and transported back to room, left in care of family.   Therapy Documentation Precautions:  Precautions Precautions: Fall Restrictions Weight Bearing Restrictions: No    Therapy/Group: Individual Therapy  Netta Corrigan, PT, DPT 01/19/2018, 8:29 AM

## 2018-01-19 NOTE — Progress Notes (Signed)
Physical Therapy Session Note  Patient Details  Name: [name] MRN: [id number] Date of Birth: 08-05-1940  Today's Date: 01/19/2018 PT Individual Time: 0915-1000 PT Individual Time Calculation (min): 45 min   Short Term Goals: Week 2:  PT Short Term Goal 1 (Week 2): =LTGs due to ELOS  Skilled Therapeutic Interventions/Progress Updates:    Pt received seated in w/c in room, agreeable to PT. No complaints of pain. Sit to stand with CGA to RW. Ambulation 2 x 60 ft with RW with L hand orthosis and min A, v/c for increased LLE clearance and to stay in middle of RW. Pt tends to drift to the L side with gait and catch LLE on RW with gait. Ascend/descend 8 3" stairs with one handrail and min A for balance, step-to gait pattern. Ascend/descend 4 6" stairs with one handrail and min A, step-to gait pattern. Pt needs increased assist when descending stairs for eccentric control. Pt left seated in w/c in room with quick release belt and chair alarm in place, needs in reach.  Therapy Documentation Precautions:  Precautions Precautions: Fall Restrictions Weight Bearing Restrictions: No Pain: Pain Assessment Pain Scale: 0-10 Pain Score: 0-No pain    Therapy/Group: Individual Therapy  Excell Seltzer, PT, DPT  01/19/2018, 10:59 AM

## 2018-01-19 NOTE — Progress Notes (Signed)
Speech Language Pathology Daily Session Notes  Patient Details  Name: [name] MRN: [id number] Date of Birth: 06-24-1940  Today's Date: 01/19/2018  Session 1: SLP Individual Time: 1130-1200 SLP Individual Time Calculation (min): 30 min   Session 2: SLP Individual Time: 1430-1500 SLP Individual Time Calculation (min): 30 min  Short Term Goals: Week 2: SLP Short Term Goal 1 (Week 2): Patient will consume current diet with minimal overt s/s of aspiration and Min verbal cues for use of swallowing strategies.  SLP Short Term Goal 2 (Week 2): Patient will attend to left field of enviornment/body during functional tasks with Min A verbal cues.  SLP Short Term Goal 3 (Week 2): Patient will demonstrate basic problem solving for functional and familiar tasks with Min A verbal cues.  SLP Short Term Goal 4 (Week 2): Patient will demonstrate sustained attention to tasks for 10 minutes with Min A verbal cues for redirection.  SLP Short Term Goal 5 (Week 2): Patient will utilize speech intelligibility strategies at the sentence level to achieve ~75% intelligibility with Min A verbal cues.   Skilled Therapeutic Interventions:  Session 1: Skilled treatment session focused on dysphagia goals. SLP facilitated session by donning dentures prior to PO intake with lunch meal of Dys. 2 textures with thin liquids. Patient consumed meal with Min A verbal cues needed for a slow pace but was overall Mod I for use of small sips of thin liquids via cup without overt s/s of aspiration. Therefore, recommend patient upgrade to thin liquids via cup but without straws. Patient verbalized understanding and agreement. Patient left upright in wheelchair with NT present. Continue with current plan of care.   Session 2: Skilled treatment session focused on patient and family education with the patient's wife and daughters. SLP facilitated session by providing education in regards to patient's current swallowing function, diet  recommendations, appropriate textures, swallowing compensatory strategies and medication administration. All were also educated on utilization of speech intelligibility strategies and need for 24 hour supervision to ensure patient's safety due to cognitive deficits, especially in regards to short-term memory deficits. Memory strategies also reviewed. Handouts were given to reinforce information. Patient left upright in wheelchair with family present and all needs within reach. Continue with current plan of care.   Pain No/Denies Pain   Therapy/Group: Individual Therapy  [name] 01/19/2018, 12:46 PM

## 2018-01-19 NOTE — Progress Notes (Addendum)
Social Work Patient ID: [name], male   DOB: [birth year], 77 y.o.   MRN: [id number]  Met with pt, wife and daughter who were here to discuss team conference goals supervision-min assist level and discharge 11/23. Wife here for PT session she wants to make sure she can manage him at discharge. Aware will need to come in Thurs or Fri to do actual hands on care. Pt wants to go up to the bedroom and not need a hospital bed. Will discuss with PT. Family education went well both coming back Thurs and Friday to do hands on care. Work toward discharge Sat.

## 2018-01-19 NOTE — Progress Notes (Signed)
Occupational Therapy Session Note  Patient Details  Name: [name] MRN: [id number] Date of Birth: [birth year]  Today's Date: 01/19/2018 OT Individual Time: 0700-0800 OT Individual Time Calculation (min): 60 min    Short Term Goals: Week 2:  OT Short Term Goal 1 (Week 2): Pt will perform toilet transfers with min A stand pivot OT Short Term Goal 2 (Week 2): Pt will perform toileting tasks with mod A OT Short Term Goal 3 (Week 2): Pt will completed LB dressing tasks with min A OT Short Term Goal 4 (Week 2): Pt will perform UB dressing tasks with min A  Skilled Therapeutic Interventions/Progress Updates:    OT intervention with focus on BADL retraining, functional amb with RW, standing balance, attention to task, use of hemi bathing/dressing strategies, attention to L, and safety awareness to increase independence with BADLs. See below for levels of assistance.  Pt continues to require mod verbal cues for hemi dressing strategies and redirection to task.  Pt frequently internally distracted when engaged in functional tasks.  Pt requires CGA for standing balance.    Therapy Documentation Precautions:  Precautions Precautions: Fall Restrictions Weight Bearing Restrictions: No   Pain: Pain Assessment Pain Scale: 0-10 Pain Score: 0-No pain ADL: ADL Eating: Set up Grooming: Minimal assistance Where Assessed-Grooming: Sitting at sink, Wheelchair Upper Body Bathing: Minimal assistance Where Assessed-Upper Body Bathing: Shower Lower Body Bathing: Minimal assistance Where Assessed-Lower Body Bathing: Shower Upper Body Dressing: Moderate assistance Where Assessed-Upper Body Dressing: Sitting at sink, Wheelchair Lower Body Dressing: Moderate assistance Where Assessed-Lower Body Dressing: Standing at sink, Wheelchair Toileting: Not assessed Toilet Transfer: Not assessed Social research officer, government: Minimal assistance, Maximal cueing Social research officer, government Method: Conservation officer, historic buildings: Radio broadcast assistant   Therapy/Group: Individual Therapy  Leroy Libman 01/19/2018, 11:39 AM

## 2018-01-19 NOTE — Patient Care Conference (Addendum)
Inpatient RehabilitationTeam Conference and Plan of Care Update Date: 01/19/2018   Time: 11:00 AM    Patient Name: [name]      Medical Record Number: [id number]  Date of Birth: 08-08-1940 Sex: Male         Room/Bed: 4W21C/4W21C-01 Payor Info: Payor: [employer] MEDICARE / Plan: [facility] MEDICARE / Product Type: *No Product type* /    Admitting Diagnosis: R CVA  Admit Date/Time:  01/05/2018  2:45 PM Admission Comments: No comment available   Primary Diagnosis:  <principal problem not specified> Principal Problem: <principal problem not specified>  Patient Active Problem List   Diagnosis Date Noted  . Anemia, chronic disease   . Left hemiparesis ([place])   . Acute ischemic cerebrovascular accident (CVA) involving right middle cerebral artery [facility])   . CKD (chronic kidney disease), stage III ([place])   . Peripheral edema   . Stroke (cerebrum) ([place]) 01/05/2018  . History of gout   . AKI (acute kidney injury) ([place])   . Stage 3 chronic kidney disease ([place])   . Anemia of chronic disease   . Left-sided weakness   . Osteoarthritis   . OSA (obstructive sleep apnea)   . Noncompliance with CPAP treatment   . History of DVT (deep vein thrombosis)   . History of CVA (cerebrovascular accident)   . Dysphagia, post-stroke   . Sinus tachycardia   . Acute blood loss anemia   . Acute CVA (cerebrovascular accident) ([place]) 01/01/2018  . Hypertensive urgency 01/01/2018  . CKD (chronic kidney disease) stage 2, GFR 60-89 ml/min 01/01/2018  . Generalized weakness   . Sepsis ([place]) 12/10/2016  . Bacteremia due to Streptococcus 12/10/2016  . Lactic acidosis 12/10/2016  . Elevated troponin 12/10/2016  . Renal insufficiency 12/10/2016  . Hypophosphatemia 12/10/2016  . Left leg swelling 12/10/2016  . Febrile illness 12/09/2016  . Febrile illness, acute   . OBESITY, UNSPECIFIED 05/31/2009  . Hyperlipidemia 05/30/2009  . Obstructive sleep apnea 05/30/2009  . Essential hypertension  05/30/2009  . CAD (coronary artery disease) 05/30/2009    Expected Discharge Date: Expected Discharge Date: 01/22/18  Team Members Present: Physician leading conference: Dr. Alysia Penna Social Worker Present: Ovidio Kin, LCSW Nurse Present: Dorthula Nettles, RN PT Present: Michaelene Song, PT OT Present: Willeen Cass, OT;Roanna Epley, COTA SLP Present: Weston [place], SLP PPS Coordinator present : Daiva Nakayama, RN, CRRN     Current Status/Progress Goal Weekly Team Focus  Medical   Continues have weakness left side upper extremity greater than lower extremity.  This is impacting ADLs as well as his mobility.  Some issues with coping  Improve mobility reduce fall risk, reduce recurrent stroke risk  Discharge planning   Bowel/Bladder   incontinent of bowel and and bladdder, LBM   11-18  manage bowel and bladder with mod assist, maintain regular bowel pattern  Timed toileting q2, Assess bowel pattern q shift and prn   Swallow/Nutrition/ Hydration   Dys. 2 textures with thin liquids via cup, supervision   Supervision  tolerance of current diet with use of strategies    ADL's   bathing-min A; UB dressing/LB dressing-mod A; functional transfers-min A; standing balance-CGA, min verbal cues to attend to L  supervision overall to be downgraded to min A for bathing and dressing tasks  LUE NMR, BADL retraining, functional transfers, safety awareness, standing balance   Mobility   min assist 60 ft with RW, CGA sit<>stand, min assist stand pivot, supervision bed mobility  supervision-CGA  gait,  L atn, balance, L NMR, awareness   Communication   Min A  Supervision  use of speech intelligibility strategies    Safety/Cognition/ Behavioral Observations  Min-Mod A  Superivsion  attention to left, selective attention, problem solving, recall    Pain   no complaints of pain  <3  Assess pain q shift and prn   Skin   no skin issues   no new skin issues  Assess skin q shift and prn      *See  Care Plan and progress notes for long and short-term goals.     Barriers to Discharge  Current Status/Progress Possible Resolutions Date Resolved   Physician    Medical stability     Progressing towards goals  Family training      Nursing                  PT                    OT                  SLP                SW                Discharge Planning/Teaching Needs:  Wife will be in today for PT session to se eif she can amange pt's care at discharge, then will come in tomorrow and Friday. She is concerned he will be too much cae for her at the level he is at.      Team Discussion:  Progressing toward his goals of supervision-min assist level. Wife here today to attend session. Still needing cues for all tasks. L-inattention and needs assist with scanning. PT to get order for toe cap. Dys 2 thin upgraded. Leg stronger than his arm. Need family education prior to discharge Sat.  Revisions to Treatment Plan:  DC 11/23    Continued Need for Acute Rehabilitation Level of Care: The patient requires daily medical management by a physician with specialized training in physical medicine and rehabilitation for the following conditions: Daily direction of a multidisciplinary physical rehabilitation program to ensure safe treatment while eliciting the highest outcome that is of practical value to the patient.: Yes Daily medical management of patient stability for increased activity during participation in an intensive rehabilitation regime.: Yes Daily analysis of laboratory values and/or radiology reports with any subsequent need for medication adjustment of medical intervention for : Neurological problems;Blood pressure problems   I attest that I was present, lead the team conference, and concur with the assessment and plan of the team.   Elease Hashimoto 01/19/2018, 1:34 PM

## 2018-01-19 NOTE — Progress Notes (Signed)
[facility] PHYSICAL MEDICINE & REHABILITATION PROGRESS NOTE   Subjective/Complaints:  No new issues overnight, reviewed blood work, creatinine creeping up  ROS: Denies CP SOB, N/V/D  Objective:   No results found. Recent Labs    01/19/18 0554  WBC 6.3  HGB 11.3*  HCT 37.0*  PLT 242   Recent Labs    01/17/18 1106 01/19/18 0554  NA 140 141  K 4.5 4.2  CL 108 106  CO2 28 27  GLUCOSE 88 98  BUN 13 16  CREATININE 1.37* 1.54*  CALCIUM 10.1 9.8    Intake/Output Summary (Last 24 hours) at 01/19/2018 0943 Last data filed at 01/19/2018 0842 Gross per 24 hour  Intake 444 ml  Output 550 ml  Net -106 ml     Physical Exam: Vital Signs Blood pressure 129/78, pulse 82, temperature 98 F (36.7 C), temperature source Oral, resp. rate 18, height _0  (1.88 m), weight 120.3 kg, SpO2 96 %. Constitutional: NAD.  Obese. HENT: Normocephalic.  Atraumatic. Eyes: EOMI.  No discharge. Left ptosis  Cardiovascular: RRR.  No JVD. Respiratory: Effort normal and breath sounds normal.  GI: Bowel sounds are normal.  Nondistended. Musculoskeletal: Lower extremity edema, unchanged Neurological: He is alert and oriented.  Left facial weakness with dysarthria.  Left inattention with left visual field deficits.  Motor: RUE: 5/5 proximal distal RLE: 5/5 proximal to distal LUE: Shoulder abduction 2-/5, elbow flexion 2-/5, elbow extension 1/5, distally trace LLE: 4+/5 proximal distal, unchanged  Skin: Bilateral shins with stasis changes and wrinkling.    Sternum with healed scar Psychiatric: He has a normal mood and affect. His behavior is normal.   Assessment/Plan: 1. Functional deficits secondary to Right MCA infarct with Left hemiparesis, dysarthria and Dysphagia which require 3+ hours per day of interdisciplinary therapy in a comprehensive inpatient rehab setting.  Physiatrist is providing close team supervision and 24 hour management of active medical problems listed  below.  Physiatrist and rehab team continue to assess barriers to discharge/monitor patient progress toward functional and medical goals  Care Tool:  Bathing    Body parts bathed by patient: Chest, Abdomen, Front perineal area, Buttocks, Right upper leg, Left upper leg, Right lower leg, Right arm, Face, Left lower leg   Body parts bathed by helper: Left arm     Bathing assist Assist Level: Minimal Assistance - Patient > 75%     Upper Body Dressing/Undressing Upper body dressing   What is the patient wearing?: Pull over shirt    Upper body assist Assist Level: Moderate Assistance - Patient 50 - 74%    Lower Body Dressing/Undressing Lower body dressing      What is the patient wearing?: Pants     Lower body assist Assist for lower body dressing: Moderate Assistance - Patient 50 - 74%     Toileting Toileting    Toileting assist Assist for toileting: Moderate Assistance - Patient 50 - 74%     Transfers Chair/bed transfer  Transfers assist     Chair/bed transfer assist level: Minimal Assistance - Patient > 75%     Locomotion Ambulation   Ambulation assist      Assist level: Minimal Assistance - Patient > 75% Assistive device: Walker-rolling Max distance: 60 ft   Walk 10 feet activity   Assist     Assist level: Minimal Assistance - Patient > 75% Assistive device: Walker-rolling   Walk 50 feet activity   Assist Walk 50 feet with 2 turns activity did not occur: Safety/medical  concerns  Assist level: Minimal Assistance - Patient > 75% Assistive device: Walker-rolling    Walk 150 feet activity   Assist Walk 150 feet activity did not occur: Safety/medical concerns         Walk 10 feet on uneven surface  activity   Assist Walk 10 feet on uneven surfaces activity did not occur: Safety/medical concerns         Wheelchair     Assist Will patient use wheelchair at discharge?: (TBD) Type of Wheelchair: Manual    Wheelchair assist  level: Minimal Assistance - Patient > 75% Max wheelchair distance: 150    Wheelchair 50 feet with 2 turns activity    Assist        Assist Level: Minimal Assistance - Patient > 75%   Wheelchair 150 feet activity     Assist     Assist Level: Minimal Assistance - Patient > 75%     Medical Problem List and Plan: 1.  Deficits with mobility, transfers, self-care, speech, swallowing secondary to multiple right infarcts acute sub acute in MCA as well as MCA/ACA watershed distribution and remote hemorrhagic infarct right thalamic.    Cont CIR PT, OT, SLP  Team conference today please see physician documentation under team conference tab, met with team face-to-face to discuss problems,progress, and goals. Formulized individual treatment plan based on medical history, underlying problem and comorbidities. 2.  DVT Prophylaxis/Anticoagulation: Pharmaceutical: Lovenox 3. Neck/shoulder pain/Pain Management: Muscle strain due to left inattention. Added K pad in addition to Sportscreme for local measures.  4. Mood: LCSW to follow for evaluation and support.  5. Neuropsych: This patient is not fully capable of making decisions on his own behalf. 6. Skin/Wound Care: Routine pressure relief measures.  7. Fluids/Electrolytes/Nutrition: Monitor I/O. Intake 522 mL yesterday 8.  Essential hypertension: continue Norvasc and lopressor--avoid hypotension.   Increased amlodipine to 90m qd Vitals:   01/18/18 1935 01/19/18 0406  BP: (!) 127/59 129/78  Pulse: 70 82  Resp: 18 18  Temp: 98 F (36.7 C) 98 F (36.7 C)  SpO2: 95% 96%   controlled on 11/18 9.  CAD: Continue aspirin, lopressor and statin.  Resumed beta-blocker as blood pressures stabilized. 10.  History of gout: Continue allopurinol 11. Chronic renal failure: Baseline serum creatinine at 1.4?  improved with hydration.   Creatinine 1.25 on 11/13, 1.37 on 11/18, up to 1.54 on 1120  Likely elevated due to Lasix, reduced to 10 mg  starting on 01/19/2018, will recheck next week  Encourage fluids 12.  Anemia of chronic disease: Monitor for now  Hemoglobin 11.5 on 11/13  Labs ordered for 11/20  Continue to monitor 13. H/o peripheral edema: has 2-3+ edema at baseline. Added TEDs.   Lasix started on 11/15 , no edema currently but intake has been low , creat creeping up will reduce dose to 178m14.  Fever: Resolved  aymptomatic, intermittent urinary incont , resolved, urine culture no growth  LOS: 14 days A FACE TO FACE EVALUATION WAS PERFORMED  AnCharlett Blake1/20/2019, 9:43 AM

## 2018-01-20 ENCOUNTER — Inpatient Hospital Stay (HOSPITAL_COMMUNITY): Payer: Medicare Other

## 2018-01-20 ENCOUNTER — Inpatient Hospital Stay (HOSPITAL_COMMUNITY): Payer: Medicare Other | Admitting: Physical Therapy

## 2018-01-20 ENCOUNTER — Inpatient Hospital Stay (HOSPITAL_COMMUNITY): Payer: Medicare Other | Admitting: Speech Pathology

## 2018-01-20 NOTE — Progress Notes (Signed)
Recreational Therapy Discharge Summary Patient Details  Name: [name] MRN: [id number] Date of Birth: May 15, 1940 Today's Date: 01/20/2018   Comments on progress toward goals: Pt participated in TR assessment session identifying leisure interest, verbally completing activity analysis with potential modifications.  Pt seen for one other session during LOS with focus on attending to the LUE, left visual field and standing balance during co-treat with PT.  Pt did meet balance goal of mod assist for dynamic standing balance during that treatment.  However, pt did not meet attending to/managing LUE goal, needing moderate cues to attend to LUE.  Pt is scheduled for discharge home with family on 11/23 to provide 24 hour supervision/assistance.   Reasons for discharge: discharge from hospital Patient/family agrees with progress made and goals achieved: Yes  Jenissa Tyrell 01/20/2018, 12:29 PM

## 2018-01-20 NOTE — Progress Notes (Signed)
[facility] PHYSICAL MEDICINE & REHABILITATION PROGRESS NOTE   Subjective/Complaints:  No issues overnite  ROS: Denies CP SOB, N/V/D  Objective:   No results found. Recent Labs    01/19/18 0554  WBC 6.3  HGB 11.3*  HCT 37.0*  PLT 242   Recent Labs    01/17/18 1106 01/19/18 0554  NA 140 141  K 4.5 4.2  CL 108 106  CO2 28 27  GLUCOSE 88 98  BUN 13 16  CREATININE 1.37* 1.54*  CALCIUM 10.1 9.8    Intake/Output Summary (Last 24 hours) at 01/20/2018 0701 Last data filed at 01/20/2018 0030 Gross per 24 hour  Intake 744 ml  Output 900 ml  Net -156 ml     Physical Exam: Vital Signs Blood pressure (!) 138/91, pulse (!) 59, temperature 98.1 F (36.7 C), resp. rate 16, height 6\' 2"  (1.88 m), weight 120.3 kg, SpO2 100 %. Constitutional: NAD.  Obese. HENT: Normocephalic.  Atraumatic. Eyes: EOMI.  No discharge. Left ptosis  Cardiovascular: RRR.  No JVD. Respiratory: Effort normal and breath sounds normal.  GI: Bowel sounds are normal.  Nondistended. Musculoskeletal: Lower extremity edema, unchanged Neurological: He is alert and oriented.  Left facial weakness with dysarthria.  Left inattention with left visual field deficits.  Motor: RUE: 5/5 proximal distal RLE: 5/5 proximal to distal LUE: Shoulder abduction 2-/5, elbow flexion 2-/5, elbow extension 1/5, distally trace LLE: 4+/5 proximal distal, unchanged  Skin: Bilateral shins with stasis changes and wrinkling.    Sternum with healed scar Psychiatric: He has a normal mood and affect. His behavior is normal.   Assessment/Plan: 1. Functional deficits secondary to Right MCA infarct with Left hemiparesis, dysarthria and Dysphagia which require 3+ hours per day of interdisciplinary therapy in a comprehensive inpatient rehab setting.  Physiatrist is providing close team supervision and 24 hour management of active medical problems listed below.  Physiatrist and rehab team continue to assess barriers to  discharge/monitor patient progress toward functional and medical goals  Care Tool:  Bathing    Body parts bathed by patient: Chest, Abdomen, Front perineal area, Buttocks, Right upper leg, Left upper leg, Right lower leg, Right arm, Face, Left lower leg   Body parts bathed by helper: Left arm     Bathing assist Assist Level: Minimal Assistance - Patient > 75%     Upper Body Dressing/Undressing Upper body dressing   What is the patient wearing?: Pull over shirt    Upper body assist Assist Level: Moderate Assistance - Patient 50 - 74%    Lower Body Dressing/Undressing Lower body dressing      What is the patient wearing?: Pants     Lower body assist Assist for lower body dressing: Moderate Assistance - Patient 50 - 74%     Toileting Toileting    Toileting assist Assist for toileting: Moderate Assistance - Patient 50 - 74%     Transfers Chair/bed transfer  Transfers assist     Chair/bed transfer assist level: Contact Guard/Touching assist     Locomotion Ambulation   Ambulation assist      Assist level: Contact Guard/Touching assist Assistive device: Walker-rolling Max distance: 80 ft   Walk 10 feet activity   Assist     Assist level: Contact Guard/Touching assist Assistive device: Walker-rolling   Walk 50 feet activity   Assist Walk 50 feet with 2 turns activity did not occur: Safety/medical concerns  Assist level: Contact Guard/Touching assist Assistive device: Walker-rolling    Walk 150 feet activity  Assist Walk 150 feet activity did not occur: Safety/medical concerns         Walk 10 feet on uneven surface  activity   Assist Walk 10 feet on uneven surfaces activity did not occur: Safety/medical concerns         Wheelchair     Assist Will patient use wheelchair at discharge?: (TBD) Type of Wheelchair: Manual    Wheelchair assist level: Supervision/Verbal cueing Max wheelchair distance: 150    Wheelchair 50 feet  with 2 turns activity    Assist        Assist Level: Supervision/Verbal cueing   Wheelchair 150 feet activity     Assist     Assist Level: Supervision/Verbal cueing     Medical Problem List and Plan: 1.  Deficits with mobility, transfers, self-care, speech, swallowing secondary to multiple right infarcts acute sub acute in MCA as well as MCA/ACA watershed distribution and remote hemorrhagic infarct right thalamic.    Cont CIR PT, OT, SLP   2.  DVT Prophylaxis/Anticoagulation: Pharmaceutical: Lovenox 3. Neck/shoulder pain/Pain Management: Muscle strain due to left inattention. Added K pad in addition to Sportscreme for local measures.  4. Mood: LCSW to follow for evaluation and support.  5. Neuropsych: This patient is not fully capable of making decisions on his own behalf. 6. Skin/Wound Care: Routine pressure relief measures.  7. Fluids/Electrolytes/Nutrition: Monitor I/O. Intake 722 mL yesterday 8.  Essential hypertension: continue Norvasc and lopressor--avoid hypotension.   Increased amlodipine to 10mg  qd Vitals:   01/19/18 2032 01/20/18 0445  BP: 139/89 (!) 138/91  Pulse: 78 (!) 59  Resp: 16 16  Temp: 98 F (36.7 C) 98.1 F (36.7 C)  SpO2: 100% 100%   controlled on 11/21 9.  CAD: Continue aspirin, lopressor and statin.  Resumed beta-blocker as blood pressures stabilized. 10.  History of gout: Continue allopurinol 11. Chronic renal failure: Baseline serum creatinine at 1.4?  improved with hydration.   Creatinine 1.25 on 11/13, 1.37 on 11/18, up to 1.54 on 1120  Likely elevated due to Lasix, reduced to 10 mg starting on 01/19/2018, will recheck next week  Encourage fluids 12.  Anemia of chronic disease: Monitor for now  Hemoglobin 11.5 on 11/13  Labs ordered for 11/20  Continue to monitor 13. H/o peripheral edema: has 2-3+ edema at baseline. Added TEDs.   Lasix started on 11/15 , no edema currently but intake has been low , creat creeping up will reduce dose  to 10mg    LOS: 15 days A FACE TO FACE EVALUATION WAS PERFORMED  Charlett Blake 01/20/2018, 7:01 AM

## 2018-01-20 NOTE — Progress Notes (Signed)
Occupational Therapy Session Note  Patient Details  Name: [name] MRN: [id number] Date of Birth: 03-31-1940  Today's Date: 01/20/2018 OT Individual Time: 1045-1200 OT Individual Time Calculation (min): 75 min    Short Term Goals: Week 2:  OT Short Term Goal 1 (Week 2): Pt will perform toilet transfers with min A stand pivot OT Short Term Goal 2 (Week 2): Pt will perform toileting tasks with mod A OT Short Term Goal 3 (Week 2): Pt will completed LB dressing tasks with min A OT Short Term Goal 4 (Week 2): Pt will perform UB dressing tasks with min A  Skilled Therapeutic Interventions/Progress Updates:    OT intervention with focus on walk in shower transfers, home safety recommendations, bed mobility, LUE PROM education, family education, functional amb with RW, and safety awareness to increase safety and independence with BADLs after discharge.  Pt's wife present and participated in walk in shower transfers.  Recommended to wait for HHOT to practice shower transfer before attempting at home.  Pt and wife verbalized understanding of recommendation.  Pt practiced getting into/out of bed independently. Educated pt/wife on LUE PROM and provided handout. Pt/wife practiced dressing tasks. Pt and wife pleased with progress and ready for discharge home on Saturday. Pt remained in w/c with belt alarm activated and wife present.  Therapy Documentation Precautions:  Precautions Precautions: Fall Restrictions Weight Bearing Restrictions: No  Pain: Pain Assessment Pain Scale: 0-10 Pain Score: 0-No pain   Therapy/Group: Individual Therapy  Leroy Libman 01/20/2018, 12:06 PM

## 2018-01-20 NOTE — Progress Notes (Signed)
Physical Therapy Session Note  Patient Details  Name: [name] MRN: [id number] Date of Birth: 1940-10-29  Today's Date: 01/20/2018 PT Individual Time: 0900-0940 AND 1600-1700 PT Individual Time Calculation (min): 40 min AND 60 min  Short Term Goals: Week 2:  PT Short Term Goal 1 (Week 2): =LTGs due to ELOS  Skilled Therapeutic Interventions/Progress Updates:   Session 1:  Pt in w/c and agreeable to therapy, no c/o pain. Session focused on functional mobility. Ambulated around unit in 100-150' distances w/ RW and LUE orthosis, CGA to close supervision. No cues needed for safety or RW management, pt doing effective job of keeping RW straight. Ambulated in controlled, household, and community environments. Weaved through cones and tight spaces to mimic household environment. Practiced negotiating stairs w/ R rail, x4 steps and CGA. Also practiced furniture transfer at couch as this is what pt usually sits on at home, needed min assist to boost into standing from low surface. Educated pt on sitting on only higher surfaces until his leg strength improves. Returned to room and ended session in w/c, all needs in reach.   Session 2:  Pt in supine and agreeable to therapy, no c/o pain. Transferred to EOB w/ supervision and increased time w/o use of bed rails. Ambulated around unit in 100-150' bouts w/ supervision using RW and had 1 bout of gait w/o AD, CGA x100'. Performed blocked practice of sit<>sidelying/supine w/ verbal and visual cues for technique, discussed that he would need to do this at home as he doesn't want a hospital bed. Performed w/ supervision and increased time, decreasing amount of energy required for task w/ blocked practice. Performed Berg Balance Scale and scored /56, explained significance of results to pt including increased fall risk and need to use AD for safety w/ gait at the time. Intermittent seated rest breaks and provided supervision while drinking water. Performed NuStep  5 min @ level 3 w/ LEs only to work on LE strengthening and reciprocal movement. Returned to room and ended session in chair, set-up w/ dinner tray and all needs in reach.   Therapy Documentation Precautions:  Precautions Precautions: Fall Restrictions Weight Bearing Restrictions: No  Therapy/Group: Individual Therapy  Courtenay Hirth K Madilynne Mullan 01/20/2018, 10:07 AM

## 2018-01-20 NOTE — Progress Notes (Signed)
Speech Language Pathology Daily Session Note  Patient Details  Name: [name] MRN: [id number] Date of Birth: Apr 22, 1940  Today's Date: 01/20/2018 SLP Individual Time: 1400-1500 SLP Individual Time Calculation (min): 60 min  Short Term Goals: Week 2: SLP Short Term Goal 1 (Week 2): Patient will consume current diet with minimal overt s/s of aspiration and Min verbal cues for use of swallowing strategies.  SLP Short Term Goal 2 (Week 2): Patient will attend to left field of enviornment/body during functional tasks with Min A verbal cues.  SLP Short Term Goal 3 (Week 2): Patient will demonstrate basic problem solving for functional and familiar tasks with Min A verbal cues.  SLP Short Term Goal 4 (Week 2): Patient will demonstrate sustained attention to tasks for 10 minutes with Min A verbal cues for redirection.  SLP Short Term Goal 5 (Week 2): Patient will utilize speech intelligibility strategies at the sentence level to achieve ~75% intelligibility with Min A verbal cues.   Skilled Therapeutic Interventions: Skilled treatment session focused on cognitive goals and completion of family education. SLP facilitated session by administering the MoCA-Version 7.3. Patient scored 20/30 points with a score of 26 or above considered normal. Patient demonstrates deficits in attention, short-term and working memory and executive functioning. Patient's wife present and education completed in regards to patient's current cognitive functioning, memory compensatory strategies and ways to incorporate strategies at home to maximize independence and overall safety. She verbalized understanding and handouts given. Patient requested to use the bathroom and ambulated to the commode with Min A verbal cues needed for scanning and attention to LUE. Patient voided successfully. Patient left upright in bed with alarm on and all needs within reach. Continue with current plan of care.      Pain No/Denies Pain    Therapy/Group: Individual Therapy  Bowden Boody, [place] 01/20/2018, 3:41 PM

## 2018-01-20 NOTE — Discharge Summary (Signed)
Physician Discharge Summary  Patient ID: [name] MRN: [id number] DOB/AGE: 1940-04-21 77 y.o.  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 [facility]) Active Problems:   Stroke (cerebrum) (HCC)   CKD (chronic kidney disease), stage III (HCC)   Peripheral edema   Left hemiparesis (HCC)   Anemia, chronic disease   Abnormal peripheral vision, left   Discharged Condition:  Stable   Significant Diagnostic Studies: N/A   Labs:  Basic Metabolic Panel: Latest Ref Rng & Units 01/19/2018 01/17/2018 01/12/2018  70 - 99 mg/dL 98 88 100(H)  8 - 23 mg/dL 16 13 15   0.61 - 1.24 mg/dL 1.54(H) 1.37(H) 1.25(H)  10 - 24 - - -  135 - 145 mmol/L 141 140 139  3.5 - 5.1 mmol/L 4.2 4.5 4.2  98 - 111 mmol/L 106 108 110  22 - 32 mmol/L 27 28 24   8.9 - 10.3 mg/dL 9.8 10.1 9.7    CBC: CBC Latest Ref Rng & Units 01/19/2018 01/12/2018 01/06/2018  WBC 4.0 - 10.5 K/uL 6.3 7.8 9.8  Hemoglobin 13.0 - 17.0 g/dL 11.3(L) 11.5(L) 12.6(L)  Hematocrit 39.0 - 52.0 % 37.0(L) 36.9(L) 40.7  Platelets 150 - 400 K/uL 242 242 244    CBG: No results for input(s): GLUCAP in the last 168 hours.  Brief HPI:   [name] is a 77 year old male with history of CAD, DJD, OSA, DVTs, CVA who was admitted on 01/01/18 with acute onset of left facial weakness, dysarthria, left-sided weakness and fall.  CT perfusion done revealing small acute right frontal/MCA penumbra and CTA head neck showed moderate cerebral artery atherosclerosis with patient's mid stenosis right M2 origin.  MRI brain showed multiple right CVA.  Patient has had issues with lethargy as well as difficulty handling secretion and he was placed on dysphagia 1 diet with nectar liquids.  Carotid Dopplers done showing large soft plaque and right ICA bifurcation however no significant stenosis noted.  TEE was negative for  thrombus or PFO and loop recorder placed by Dr. Lovena Le.  Neurology recommended aspirin Plavix for embolic stroke felt to be from a right-ICA soft plaque versus cardioembolic source.  Patient with residual left-sided weakness with left inattention as well as cognitive deficits affecting ADLs and mobility.  CIR was recommended for follow-up therapy   Hospital Course: [name] was admitted to rehab 01/05/2018 for inpatient therapies to consist of PT, ST and OT at least three hours five days a week. Past admission physiatrist, therapy team and rehab RN have worked together to provide customized collaborative inpatient rehab.  Blood pressures are monitored on twice daily basis with permissive hypertension initially.  He was started on IV fluids at night due to acute on chronic renal failure as well as dietary restriction due to dysphasia diet.  As swallow functions improved he was advanced to regular and he is tolerating this without any signs or symptoms of aspiration.  Lasix was decreased to 20 mg and teds were ordered to help with management of peripheral edema.  Lasix has been titrated upwards for tighter control.       Follow up  Lytes showed serum creatinine stable at 1.5 which is likely his baseline.  Serial CBC weights show a drop in H&H to 11.3 and 37.0 without signs of bleeding.  Recommend repeat labs to monitor for stability on post hospital follow-up.  His weights have stable  during his stay with last check at  265 pounds.  He has had improvement in cognition as well as overall mobility.  He had progressed to supervision to min assist level by discharge. He will continue to receive follow-up home health PT, OT and speech therapy by [facility] past discharge.   Rehab course: During patient's stay in rehab weekly team conferences were held to monitor patient's progress, set goals and discuss barriers to discharge. At admission, patient required max assist with ADL task and min assist with  mobility.  He displays moderate dysarthria with min to moderate oral phase dysphagia requiring modified diet as well as moderate cognitive impairments affecting attention, problem solving, safety awareness, recall and awareness of left inattention.   He  has had improvement in activity tolerance, balance, postural control as well as ability to compensate for deficits. He has had improvement in functional use LUE  and LLE as well as improvement in awareness.  He requires min assist for bathing and mod assist for lower body dressing.  He requires supervision to perform transfers with supervision and to ambulate 150 feet with RW and verbal cues for safety.  He requires supervision to min assist due to decrease in anticipatory awareness and for semi-complex problem-solving.  Family education was completed with wife and children regarding all aspects of care and safety.    Disposition: Home.   Diet: Heart Healthy.   Special Instructions: 1. Family to assist with medication management.  2. Needs follow up labs to monitor stability of renal status and H/H.  3. Will need follow up with [facility] for input on carotid plaque.    Allergies as of 01/22/2018      Reactions   Ramipril Cough      Medication List    STOP taking these medications   omeprazole 40 MG capsule Commonly known as:  PRILOSEC Replaced by:  pantoprazole 40 MG tablet   phosphorus 155-852-130 MG tablet Commonly known as:  K PHOS NEUTRAL   potassium chloride 10 MEQ tablet Commonly known as:  K-DUR     TAKE these medications   acetaminophen 325 MG tablet Commonly known as:  TYLENOL Take 1-2 tablets (325-650 mg total) by mouth every 4 (four) hours as needed for mild pain.   allopurinol 100 MG tablet Commonly known as:  ZYLOPRIM Take 200 mg by mouth daily.   amLODipine 5 MG tablet Commonly known as:  NORVASC TAKE ONE TABLET BY MOUTH ONCE DAILY.   aspirin EC 81 MG tablet Take one pill daily thorough 11/27. What changed:     how much to take  how to take this  when to take this  additional instructions   clopidogrel 75 MG tablet Commonly known as:  PLAVIX Take 1 tablet (75 mg total) by mouth daily.   furosemide 20 MG tablet Commonly known as:  LASIX Take 0.5 tablets (10 mg total) by mouth daily. What changed:    how much to take  when to take this   metoprolol tartrate 25 MG tablet Commonly known as:  LOPRESSOR TAKE ONE TABLET BY MOUTH TWICE DAILY   montelukast 10 MG tablet Commonly known as:  SINGULAIR Take 10 mg by mouth at bedtime.   MUSCLE RUB 10-15 % Crea Apply 1 application topically 4 (four) times daily -  with meals and at bedtime. To neck and shoulders   niacin 500 MG CR tablet Commonly known as:  NIASPAN Take 500 mg by mouth at bedtime.   pantoprazole 40  MG tablet Commonly known as:  PROTONIX Take 1 tablet (40 mg total) by mouth daily. Replaces:  omeprazole 40 MG capsule   polyethylene glycol packet Commonly known as:  MIRALAX / GLYCOLAX Take 17 g by mouth daily.   rosuvastatin 20 MG tablet Commonly known as:  CRESTOR Take 1 tablet (20 mg total) by mouth daily at 6 PM.   tamsulosin 0.4 MG Caps capsule Commonly known as:  FLOMAX Take 1 capsule (0.4 mg total) by mouth daily.      Follow-up Information    Kirsteins, Luanna Salk, MD Follow up.   Specialty:  Physical Medicine and Rehabilitation Why:  Office will call you with follow up appointment Contact information: [place] [address] [phone]        [place]. Call.   Why:  for follow up appointment Contact information: [place] [address] [phone]       Waynetta Sandy, MD. Call.   Specialties:  Vascular Surgery, Cardiology Why:  for follow up on carotid plaque Contact information: [place] [address] [phone]        Rogers Blocker, MD Follow up on 02/02/2018.    Specialty:  Internal Medicine Why:  Appointment @ 2:30 PM Contact information: [address] [place] [address] [phone]           Signed: Bary Leriche 01/25/2018, 10:01 AM

## 2018-01-21 ENCOUNTER — Inpatient Hospital Stay (HOSPITAL_COMMUNITY): Payer: Medicare Other | Admitting: Physical Therapy

## 2018-01-21 ENCOUNTER — Inpatient Hospital Stay (HOSPITAL_COMMUNITY): Payer: Medicare Other | Admitting: Speech Pathology

## 2018-01-21 ENCOUNTER — Telehealth: Payer: Self-pay

## 2018-01-21 ENCOUNTER — Inpatient Hospital Stay (HOSPITAL_COMMUNITY): Payer: Medicare Other | Admitting: Occupational Therapy

## 2018-01-21 DIAGNOSIS — H53452 Other localized visual field defect, left eye: Secondary | ICD-10-CM

## 2018-01-21 MED ORDER — PANTOPRAZOLE SODIUM 40 MG PO TBEC: 40.0000 mg | DELAYED_RELEASE_TABLET | Freq: Every day | ORAL | 0 refills | Status: DC

## 2018-01-21 MED ORDER — POLYETHYLENE GLYCOL 3350 17 G PO PACK: 17.0000 g | PACK | Freq: Every day | ORAL | 0 refills | Status: DC

## 2018-01-21 MED ORDER — CLOPIDOGREL BISULFATE 75 MG PO TABS: 75.0000 mg | ORAL_TABLET | Freq: Every day | ORAL | 0 refills | Status: DC

## 2018-01-21 MED ORDER — POLYETHYLENE GLYCOL 3350 17 G PO PACK: 17.0000 g | PACK | Freq: Two times a day (BID) | ORAL | 0 refills | Status: DC

## 2018-01-21 MED ORDER — ASPIRIN EC 81 MG PO TBEC: DELAYED_RELEASE_TABLET | ORAL | 0 refills | Status: AC

## 2018-01-21 MED ORDER — MUSCLE RUB 10-15 % EX CREA: 1.0000 "application " | TOPICAL_CREAM | Freq: Three times a day (TID) | CUTANEOUS | 0 refills | Status: AC

## 2018-01-21 MED ORDER — AMLODIPINE BESYLATE 5 MG PO TABS: ORAL_TABLET | ORAL | 0 refills | Status: DC

## 2018-01-21 MED ORDER — ACETAMINOPHEN 325 MG PO TABS: 325.0000 mg | ORAL_TABLET | ORAL | Status: AC | PRN

## 2018-01-21 MED ORDER — FUROSEMIDE 20 MG PO TABS: 10.0000 mg | ORAL_TABLET | Freq: Every day | ORAL | 0 refills | Status: DC

## 2018-01-21 NOTE — Progress Notes (Signed)
[facility] PHYSICAL MEDICINE & REHABILITATION PROGRESS NOTE   Subjective/Complaints:  No new complaints.  Aware of discharge in a.m.  ROS: Denies CP SOB, N/V/D  Objective:   No results found. Recent Labs    01/19/18 0554  WBC 6.3  HGB 11.3*  HCT 37.0*  PLT 242   Recent Labs    01/19/18 0554  NA 141  K 4.2  CL 106  CO2 27  GLUCOSE 98  BUN 16  CREATININE 1.54*  CALCIUM 9.8    Intake/Output Summary (Last 24 hours) at 01/21/2018 0855 Last data filed at 01/21/2018 0837 Gross per 24 hour  Intake 680 ml  Output 75 ml  Net 605 ml     Physical Exam: Vital Signs Blood pressure 116/81, pulse 62, temperature 98 F (36.7 C), temperature source Oral, resp. rate 12, height 6\' 2"  (1.88 m), weight 120.3 kg, SpO2 100 %. Constitutional: NAD.  Obese. HENT: Normocephalic.  Atraumatic. Eyes: EOMI.  No discharge. Left ptosis  Cardiovascular: RRR.  No JVD. Respiratory: Effort normal and breath sounds normal.  GI: Bowel sounds are normal.  Nondistended. Musculoskeletal: Lower extremity edema, unchanged Neurological: He is alert and oriented.  Left facial weakness with dysarthria.  Left inattention with left visual field deficits.  Motor: RUE: 5/5 proximal distal RLE: 5/5 proximal to distal LUE: Shoulder abduction 2-/5, elbow flexion 2-/5, elbow extension 1/5, distally trace LLE: 4+/5 proximal distal, unchanged  Skin: Bilateral shins with stasis changes and wrinkling.    Sternum with healed scar Psychiatric: He has a normal mood and affect. His behavior is normal.   Assessment/Plan: 1. Functional deficits secondary to Right MCA infarct with Left hemiparesis, dysarthria and Dysphagia which require 3+ hours per day of interdisciplinary therapy in a comprehensive inpatient rehab setting.  Physiatrist is providing close team supervision and 24 hour management of active medical problems listed below.  Physiatrist and rehab team continue to assess barriers to discharge/monitor  patient progress toward functional and medical goals  Care Tool:  Bathing    Body parts bathed by patient: Chest, Abdomen, Front perineal area, Buttocks, Right upper leg, Left upper leg, Right lower leg, Right arm, Face, Left lower leg   Body parts bathed by helper: Left arm     Bathing assist Assist Level: Minimal Assistance - Patient > 75%     Upper Body Dressing/Undressing Upper body dressing   What is the patient wearing?: Pull over shirt    Upper body assist Assist Level: Moderate Assistance - Patient 50 - 74%    Lower Body Dressing/Undressing Lower body dressing      What is the patient wearing?: Pants     Lower body assist Assist for lower body dressing: Moderate Assistance - Patient 50 - 74%     Toileting Toileting    Toileting assist Assist for toileting: Moderate Assistance - Patient 50 - 74%     Transfers Chair/bed transfer  Transfers assist     Chair/bed transfer assist level: Contact Guard/Touching assist     Locomotion Ambulation   Ambulation assist      Assist level: Contact Guard/Touching assist Assistive device: Walker-rolling Max distance: 150'   Walk 10 feet activity   Assist     Assist level: Contact Guard/Touching assist Assistive device: Walker-rolling   Walk 50 feet activity   Assist Walk 50 feet with 2 turns activity did not occur: Safety/medical concerns  Assist level: Contact Guard/Touching assist Assistive device: Walker-rolling    Walk 150 feet activity   Assist Walk  150 feet activity did not occur: Safety/medical concerns  Assist level: Contact Guard/Touching assist Assistive device: Walker-rolling    Walk 10 feet on uneven surface  activity   Assist Walk 10 feet on uneven surfaces activity did not occur: Safety/medical concerns         Wheelchair     Assist Will patient use wheelchair at discharge?: (TBD) Type of Wheelchair: Manual    Wheelchair assist level: Supervision/Verbal  cueing Max wheelchair distance: 150    Wheelchair 50 feet with 2 turns activity    Assist        Assist Level: Supervision/Verbal cueing   Wheelchair 150 feet activity     Assist     Assist Level: Supervision/Verbal cueing     Medical Problem List and Plan: 1.  Deficits with mobility, transfers, self-care, speech, swallowing secondary to multiple right infarcts acute sub acute in MCA as well as MCA/ACA watershed distribution and remote hemorrhagic infarct right thalamic.    Cont CIR PT, OT, SLP, plan discharge 01/21/2018   2.  DVT Prophylaxis/Anticoagulation: Pharmaceutical: Lovenox 3. Neck/shoulder pain/Pain Management: Muscle strain due to left inattention. Added K pad in addition to Sportscreme for local measures.  4. Mood: LCSW to follow for evaluation and support.  5. Neuropsych: This patient is not fully capable of making decisions on his own behalf. 6. Skin/Wound Care: Routine pressure relief measures.  7. Fluids/Electrolytes/Nutrition: Monitor I/O. Intake 600 mL yesterday 8.  Essential hypertension: continue Norvasc and lopressor--avoid hypotension.   Increased amlodipine to 10mg  qd Vitals:   01/20/18 2056 01/21/18 0524  BP: 132/68 116/81  Pulse: (!) 59 62  Resp: 18 12  Temp: 98 F (36.7 C)   SpO2: 100% 100%   controlled on 11/22 9.  CAD: Continue aspirin, lopressor and statin.  Resumed beta-blocker as blood pressures stabilized. 10.  History of gout: Continue allopurinol 11. Chronic renal failure: Baseline serum creatinine at 1.4?  improved with hydration.   Creatinine 1.25 on 11/13, 1.37 on 11/18, up to 1.54 on 1120  Likely elevated due to Lasix, reduced to 10 mg starting on 01/19/2018, will recheck next week  Encourage fluids 12.  Anemia of chronic disease: Monitor for now  Hemoglobin 11.5 on 11/13  Labs ordered for 11/20  Continue to monitor 13. H/o peripheral edema: has 2-3+ edema at baseline. Added TEDs.   Lasix started on 11/15 , no edema  currently but intake has been low , creat creeping up will reduce dose to 10mg    LOS: 16 days A FACE TO FACE EVALUATION WAS PERFORMED  Charlett Blake 01/21/2018, 8:55 AM

## 2018-01-21 NOTE — Progress Notes (Signed)
Physical Therapy Discharge Summary  Patient Details  Name: [name] MRN: [id number] Date of Birth: 11-26-1940  Today's Date: 01/21/2018 PT Individual Time: 0800-0828 AND 1300-1353 PT Individual Time Calculation (min): 28 min AND 53 min  Session 1:  Pt in supine and agreeable to therapy, denies pain. Performed bed mobility w/ supervision and increased time, needed multiple attempts to reach EOB 2/2 fatigue after having just woken up. Ambulated to/from toilet and performed pericare and brief management w/ supervision, verbal cues for RW management in bathroom. Returned to sit in chair, provided set-up assist to don dentures and supervision while eating a few bites of breakfast. Ended session in chair in and in care of NT, all needs met.  Session 2:  Pt in supine and agreeable to therapy, denies pain. Transferred to EOB w/ increased time and supervision. Ambulated around unit w/ supervision using RW and LUE orthosis as detailed below. Negotiated 8 steps w/ bilateral rails as per home set-up x2 bouts. Pt's daughter providing CGA on 2nd bout safely. Performed car transfer w/ supervision and returned to room. Ended session in supine, all needs in reach. Educated pt and daughter on importance of calling EMS if pt were to have a fall. He is unsafe to get up w/ assistance from family members. Discussed energy conservation strategies w/ household mobility and importance of moving at a slow pace as this allows for pt to make sure his L side is keeping up and improves safety w/ RW management. Both verbalized understanding of all education.   Patient has met 9 of 9 long term goals due to improved activity tolerance, improved balance, improved postural control, increased strength, ability to compensate for deficits, functional use of  left lower extremity, improved attention, improved awareness and improved coordination.  Patient to discharge at an ambulatory level Supervision.   Patient's care partner is  independent to provide the necessary physical assistance at discharge. Pt's wife and children planning to arrange for 24/7 supervision and CGA assist for stair negotiation, both his wife and daughter have been educated on providing this level of care and feel comfortable taking him home.   Reasons goals not met: n/a  Recommendation:  Patient will benefit from ongoing skilled PT services in home health setting to continue to advance safe functional mobility, address ongoing impairments in functional balance, global strengthening and endurance, and safety awareness, and minimize fall risk.  Equipment: RW  Reasons for discharge: treatment goals met and discharge from hospital  Patient/family agrees with progress made and goals achieved: Yes  PT Discharge Precautions/Restrictions Precautions Precautions: Fall Restrictions Weight Bearing Restrictions: No Vital Signs Therapy Vitals Pulse Rate: 62 Resp: 12 BP: 116/81 Patient Position (if appropriate): Lying Oxygen Therapy SpO2: 100 % Pain Pain Assessment Pain Scale: 0-10 Pain Score: 0-No pain Vision/Perception  Vision - Assessment Eye Alignment: Impaired (comment) Ocular Range of Motion: Restricted on the left Alignment/Gaze Preference: Gaze right Tracking/Visual Pursuits: Decreased smoothness of horizontal tracking;Decreased smoothness of vertical tracking;Left eye does not track medially Perception Perception: Impaired Inattention/Neglect: Does not attend to left visual field;Does not attend to left side of body(mild, much improved since eval, able to compensate) Praxis Praxis: Intact  Cognition Overall Cognitive Status: Impaired/Different from baseline Arousal/Alertness: Awake/alert Orientation Level: Oriented X4 Sensation Sensation Light Touch: Appears Intact Motor  Motor Motor: Hemiplegia Motor - Skilled Clinical Observations: L hemi, UE>LE Motor - Discharge Observations: L hemi, UE>LE  Mobility Bed Mobility Bed  Mobility: Rolling Right;Rolling Left;Sit to Supine;Supine to Sit Rolling Right: Supervision/verbal  cueing Rolling Left: Supervision/Verbal cueing Supine to Sit: Supervision/Verbal cueing Sit to Supine: Supervision/Verbal cueing Transfers Transfers: Sit to Stand;Stand to Sit;Stand Pivot Transfers Sit to Stand: Supervision/Verbal cueing Stand to Sit: Supervision/Verbal cueing Stand Pivot Transfers: Supervision/Verbal cueing Transfer (Assistive device): Rolling walker Locomotion  Gait Ambulation: Yes Gait Assistance: Supervision/Verbal cueing Gait Distance (Feet): 150 Feet Assistive device: Rolling walker Gait Assistance Details: Verbal cues for precautions/safety;Verbal cues for safe use of DME/AE Gait Gait: Yes Gait Pattern: Impaired Gait Pattern: Trunk flexed;Poor foot clearance - left Gait velocity: Decreased Stairs / Additional Locomotion Stairs: Yes Stairs Assistance: Contact Guard/Touching assist Stair Management Technique: One rail Right Number of Stairs: 8 Height of Stairs: 6 Wheelchair Mobility 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, forensic)  Trunk/Postural Assessment  Cervical Assessment Cervical Assessment: Exceptions to WFL(decreased L rotation, ~75% of R rotation, much improved since eval and no pain) Thoracic Assessment Thoracic Assessment: Within Functional Limits Lumbar Assessment Lumbar Assessment: Exceptions to WFL(posterior pelvic tilt) Postural Control Postural Control: Deficits on evaluation Righting Reactions: delayed  Balance Balance Balance Assessed: Yes Static Sitting Balance Static Sitting - Balance Support: No upper extremity supported;Feet supported Static Sitting - Level of Assistance: 5: Stand by assistance Dynamic Sitting Balance Dynamic Sitting - Balance Support: No upper extremity supported;Feet supported;During functional activity Dynamic Sitting - Level of Assistance: 5: Stand by assistance Dynamic Standing Balance Dynamic Standing  - Balance Support: No upper extremity supported;During functional activity Dynamic Standing - Level of Assistance: 5: Stand by assistance Extremity Assessment  RLE Assessment RLE Assessment: Within Functional Limits LLE Assessment LLE Assessment: Exceptions to [facility] Passive Range of Motion (PROM) Comments: [facility] Strength Comments: hip musculature 3+/5, otherwise 5/5 globally    Takeia Ciaravino K Marcanthony Sleight 01/21/2018, 9:06 AM

## 2018-01-21 NOTE — Telephone Encounter (Signed)
LMOVM reminding pt to send remote transmission.   

## 2018-01-21 NOTE — Progress Notes (Signed)
Social Work  Discharge Note  The overall goal for the admission was met for: DC SAT 11/23  Discharge location: [place] TO ASSIST  Length of Stay: Yes-17 DAYS  Discharge activity level: Yes-SUPERVISION-MIN ASSIST LEVEL  Home/community participation: Yes  Services provided included: MD, RD, PT, OT, SLP, RN, CM, TR, Pharmacy, Neuropsych and SW  Financial Services: Private Insurance: [facility]  Follow-up services arranged: Home Health: [facility]-PT,OT,SP, DME: [place] 3 IN 1 and Patient/Family has no preference for HH/DME agencies ADDED [facility]. INFORMED WIFE CAN NOT GET A WIDE TRANSPORT CHAIR WILL NEED TO LOOK ON LINE TO PURCHASE.  Comments (or additional information):WIFE AND CHILDREN WERE HERE FOR FAMILY EDUCATION IT WENT VERY WELL AND ALL FEEL COMFORTABLE REGARDING HIS CARE. START OFF WITH HOME HEALTH AND THEN TRANSITION TO OP ONCE MORE MOBILE  Patient/Family verbalized understanding of follow-up arrangements: Yes  Individual responsible for coordination of the follow-up plan: [name]-WIFE  Confirmed correct DME delivered: Elease Hashimoto 01/21/2018    Elease Hashimoto

## 2018-01-21 NOTE — Discharge Instructions (Signed)
Inpatient Rehab Discharge Instructions  MATYAS BAISLEY Discharge date and time:  01/22/18  Activities/Precautions/ Functional Status: Activity: No lifting, driving, or strenuous exercise till cleared by MD Diet: cardiac diet Wound Care: Keep area clean and dry.   Functional status:  ___ No restrictions     ___ Walk up steps independently _X__ 24/7 supervision/assistance   ___ Walk up steps with assistance ___ Intermittent supervision/assistance  ___ Bathe/dress independently ___ Walk with walker     _X__ Bathe/dress with assistance ___ Walk Independently    ___ Shower independently _X__ Walk with supervision     ___ Shower with assistance ___ No alcohol     ___ Return to work/school ________   Special Instructions: 1.Family needs to assist with medication management/administration.  2. Can change Miralax to as needed depending on constipation.    COMMUNITY REFERRALS UPON DISCHARGE:    Home Health:   PT, OT,SP    Agency:[facility]   Phone:[phone]   Date of last service:01/22/2018  Medical Equipment/Items Ordered:ROLLING WALKER., WIDE 3 IN 1 & TUB BENCH  Agency/Supplier:[facility]   [phone]   GENERAL COMMUNITY RESOURCES FOR PATIENT/FAMILY: Support Groups:[facility] SUPPORT GROUP THE SECOND Thursday @ 6:00-7:00 PM ON THE REHAB UNIT  QUESTIONS CALL [phone]   STROKE/TIA DISCHARGE INSTRUCTIONS SMOKING Cigarette smoking nearly doubles your risk of having a stroke & is the single most alterable risk factor  If you smoke or have smoked in the last 12 months, you are advised to quit smoking for your health.  Most of the excess cardiovascular risk related to smoking disappears within a year of stopping.  Ask you doctor about anti-smoking medications  [place] Quit Line: 1-800-QUIT NOW  Free Smoking Cessation Classes [phone]  CHOLESTEROL Know your levels; limit fat & cholesterol in your diet  Lipid Panel     Component Value Date/Time   CHOL 127 01/01/2018  0308   CHOL 186 09/07/2017 1617   TRIG 73 01/01/2018 0308   HDL 39 (L) 01/01/2018 0308   HDL 39 (L) 09/07/2017 1617   CHOLHDL 3.3 01/01/2018 0308   VLDL 15 01/01/2018 0308   LDLCALC 73 01/01/2018 0308   LDLCALC 126 (H) 09/07/2017 1617      Many patients benefit from treatment even if their cholesterol is at goal.  Goal: Total Cholesterol (CHOL) less than 160  Goal:  Triglycerides (TRIG) less than 150  Goal:  HDL greater than 40  Goal:  LDL (LDLCALC) less than 100   BLOOD PRESSURE American Stroke Association blood pressure target is less that 120/80 mm/Hg  Your discharge blood pressure is:  BP: (!) 138/91  Monitor your blood pressure  Limit your salt and alcohol intake  Many individuals will require more than one medication for high blood pressure  DIABETES (A1c is a blood sugar average for last 3 months) Goal HGBA1c is under 7% (HBGA1c is blood sugar average for last 3 months)  Diabetes: Pre-diabetes    Lab Results  Component Value Date   HGBA1C 5.8 (H) 01/01/2018     Your HGBA1c can be lowered with medications, healthy diet, and exercise.  Check your blood sugar as directed by your physician  Call your physician if you experience unexplained or low blood sugars.  PHYSICAL ACTIVITY/REHABILITATION Goal is 30 minutes at least 4 days per week  Activity: No driving, Therapies: see above Return to work:  N/A  Activity decreases your risk of heart attack and stroke and makes your heart stronger.  It helps control  your weight and blood pressure; helps you relax and can improve your mood.  Participate in a regular exercise program.  Talk with your doctor about the best form of exercise for you (dancing, walking, swimming, cycling).  DIET/WEIGHT Goal is to maintain a healthy weight  Your discharge diet is:  Diet Order            DIET DYS 2 Room service appropriate? Yes; Fluid consistency: Thin  Diet effective 1000             liquids Your height is:  Height: 6\' 2"   (188 cm) Your current weight is: Weight: 120.3 kg Your Body Mass Index (BMI) is:  BMI (Calculated): 34.04  Following the type of diet specifically designed for you will help prevent another stroke.  Your goal weight is:    Your goal Body Mass Index (BMI) is 19-24.  Healthy food habits can help reduce 3 risk factors for stroke:  High cholesterol, hypertension, and excess weight.  RESOURCES Stroke/Support Group:  Call [phone]   STROKE EDUCATION PROVIDED/REVIEWED AND GIVEN TO PATIENT Stroke warning signs and symptoms How to activate emergency medical system (call 911). Medications prescribed at discharge. Need for follow-up after discharge. Personal risk factors for stroke. Pneumonia vaccine given:  Flu vaccine given:  My questions have been answered, the writing is legible, and I understand these instructions.  I will adhere to these goals & educational materials that have been provided to me after my discharge from the hospital.     My questions have been answered and I understand these instructions. I will adhere to these goals and the provided educational materials after my discharge from the hospital.  Patient/Caregiver Signature _______________________________ Date __________  Clinician Signature _______________________________________ Date __________  Please bring this form and your medication list with you to all your follow-up doctor's appointments.

## 2018-01-21 NOTE — Progress Notes (Signed)
Occupational Therapy Session Note  Patient Details  Name: [name] MRN: [id number] Date of Birth: [birth year]  Today's Date: 01/21/2018 OT Individual Time: 2633-3545 OT Individual Time Calculation (min): 57 min    Short Term Goals: Week 2:  OT Short Term Goal 1 (Week 2): Pt will perform toilet transfers with min A stand pivot OT Short Term Goal 2 (Week 2): Pt will perform toileting tasks with mod A OT Short Term Goal 3 (Week 2): Pt will completed LB dressing tasks with min A OT Short Term Goal 4 (Week 2): Pt will perform UB dressing tasks with min A  Skilled Therapeutic Interventions/Progress Updates:    Pt worked on bathing and dressing during session.  Supervision for all transfers with use of the RW with hand support on the left.  Mod instructional cueing needed however for him to recall the need to remove the LUE from the walker splint before sitting down, as well as squaring the walker up with him prior to sitting.  He needed min assist for bathing with mod instructional cueing on hemi techniques to wash the RUE.  He transferred out to the wheelchair at the sink for dressing.  Mod assist for donning brief and pants as well as pullover shirt.  He was unable to thread clothing over the LLE and needed therapist assist.  He was educated on hemi technique for donning shirt with mod demonstrational cueing.  Max assist for donning socks with one handed technique as well as shoes.  Finished session with transfer back to the bedside chair for pt to rest.  Chair belt alarm in place and call button and phone in reach.    Therapy Documentation Precautions:  Precautions Precautions: Fall Restrictions Weight Bearing Restrictions: No   Pain: Pain Assessment Pain Scale: 0-10 Pain Score: 0-No pain ADL: ADL Eating: Set up Grooming: Minimal assistance Where Assessed-Grooming: Sitting at sink, Wheelchair Upper Body Bathing: Minimal assistance Where Assessed-Upper Body Bathing: Shower Lower  Body Bathing: Minimal assistance Where Assessed-Lower Body Bathing: Shower Upper Body Dressing: Moderate assistance Where Assessed-Upper Body Dressing: Sitting at sink, Wheelchair Lower Body Dressing: Moderate assistance Where Assessed-Lower Body Dressing: Standing at sink, Wheelchair Toileting: Not assessed Toilet Transfer: Not assessed Social research officer, government: Minimal assistance, Maximal cueing Social research officer, government Method: Heritage manager: Radio broadcast assistant  Therapy/Group: Individual Therapy  Darcey Demma OTR/L 01/21/2018, 12:28 PM

## 2018-01-21 NOTE — Progress Notes (Signed)
Speech Language Pathology Discharge Summary  Patient Details  Name: [name] MRN: [id number] Date of Birth: 29-Jan-1941  Today's Date: 01/21/2018 SLP Individual Time: 1045-1130 SLP Individual Time Calculation (min): 45 min   Skilled Therapeutic Interventions:  Skilled treatment sessio focused on recall of current swallow precautions, diet and compensatory strategies. Pt also able to recall his medicines with Min A (PA had just spoke with pt about medicines). Pt able to organize basic medication pill box with Min A cues. Pt very appreciative of staff and therapy.     Patient has met 8 of 8 long term goals.  Patient to discharge at overall Supervision;Min level.    Clinical Impression/Discharge Summary:   Pt has made good progress in skilled ST and as a result his has met 8 of 8 LTGs and is discharging at Supervision to [place]. Education has been provided to pt and wife. Pt continues with decreased anticipatory awareness and is at higher risk of fall as a result. Extensive education provided. Continue to recommend HHST to target safe consumption of advanced diet textures, semi-complex problem solving and to increase safety awareness within home environment.   Care Partner:  Caregiver Able to Provide Assistance: Yes  Type of Caregiver Assistance: Cognitive  Recommendation:  24 hour supervision/assistance;Home Health SLP  Rationale for SLP Follow Up: Maximize cognitive function and independence;Maximize swallowing safety;Reduce caregiver burden   Equipment:     Reasons for discharge: Discharged from hospital;Treatment goals met   Patient/Family Agrees with Progress Made and Goals Achieved: Yes    Rodina Pinales 01/21/2018, 11:02 AM

## 2018-01-22 NOTE — Discharge Summary (Signed)
[name] is a 77 y.o. male 02-Dec-1940 [id number]  Subjective: No new complaints. No new problems. Slept well. Feeling good and ready to go home - Wife and dtr reviewed DC plans and instructions with team yesterday - no new questions or concerns  Objective: Vital signs in last 24 hours: Temp:  [97.4 F (36.3 C)-98.6 F (37 C)] 98.6 F (37 C) (11/23 0440) Pulse Rate:  [57-73] 73 (11/23 0440) Resp:  [18-19] 18 (11/23 0440) BP: (127-141)/(71-87) 127/87 (11/23 0440) SpO2:  [92 %-100 %] 97 % (11/23 0440) Weight change:  Last BM Date: 01/20/18  Intake/Output from previous day: 11/22 0701 - 11/23 0700 In: 876 [P.O.:722] Out: 600 [Urine:600]  Physical Exam General: No apparent distress    Lungs: Normal effort. Lungs clear to auscultation, no crackles or wheezes. Cardiovascular: Regular rate and rhythm, no edema Musculoskeletal:  Neurovascularly intact Neurological: No new neurological deficits Wounds: N/A    Clean, dry, intact. No signs of infection.  Lab Results: BMET    Component Value Date/Time   NA 141 01/19/2018 0554   NA 143 09/07/2017 1617   K 4.2 01/19/2018 0554   CL 106 01/19/2018 0554   CO2 27 01/19/2018 0554   GLUCOSE 98 01/19/2018 0554   BUN 16 01/19/2018 0554   BUN 21 09/07/2017 1617   CREATININE 1.54 (H) 01/19/2018 0554   CALCIUM 9.8 01/19/2018 0554   GFRNONAA 42 (L) 01/19/2018 0554   GFRAA 49 (L) 01/19/2018 0554   CBC    Component Value Date/Time   WBC 6.3 01/19/2018 0554   RBC 4.30 01/19/2018 0554   HGB 11.3 (L) 01/19/2018 0554   HGB 13.0 09/07/2017 1617   HCT 37.0 (L) 01/19/2018 0554   HCT 38.9 09/07/2017 1617   PLT 242 01/19/2018 0554   PLT 220 09/07/2017 1617   MCV 86.0 01/19/2018 0554   MCV 82 09/07/2017 1617   MCH 26.3 01/19/2018 0554   MCHC 30.5 01/19/2018 0554   RDW 15.9 (H) 01/19/2018 0554   RDW 16.4 (H) 09/07/2017 1617   LYMPHSABS 2.0 01/06/2018 0532   MONOABS 1.2 (H) 01/06/2018 0532   EOSABS 0.2 01/06/2018 0532   BASOSABS 0.0  01/06/2018 0532   CBG's (last 3):  No results for input(s): GLUCAP in the last 72 hours. LFT's Lab Results  Component Value Date   ALT 12 01/06/2018   AST 17 01/06/2018   ALKPHOS 45 01/06/2018   BILITOT 0.8 01/06/2018    Studies/Results: No results found.  Medications:  I have reviewed the patient's current medications. Scheduled Medications: . allopurinol  200 mg Oral Daily  . amLODipine  5 mg Oral Daily  . aspirin EC  81 mg Oral Daily  . clopidogrel  75 mg Oral Daily  . enoxaparin (LOVENOX) injection  40 mg Subcutaneous Q24H  . furosemide  10 mg Oral Daily  . metoprolol tartrate  25 mg Oral BID  . MUSCLE RUB   Topical TID WC & HS  . niacin  500 mg Oral QHS  . pantoprazole  40 mg Oral Daily  . polyethylene glycol  17 g Oral BID  . rosuvastatin  20 mg Oral q1800  . 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 Deficits with mobility, transfers, self-care, speech, swallowing secondary to multiple right infarcts acute sub acute in MCA as well as MCA/ACA watershed distribution and remote hemorrhagic infarct right thalamic.              Stable for DC home today as planned             2.  Neck/shoulder pain/Pain Management: Muscle strain due to left inattention. symptomatic care to continue as needed at home 3. Essential hypertension: continue home meds as rx'd and avoid hypotension.                   Vitals:   01/20/18 2056 01/21/18 0524  BP: 132/68  116/81  Pulse: (!) 59 62  Resp: 18 12  Temp: 98 F (36.7 C)   SpO2: 100% 100%   4. CAD: Continue aspirin, lopressor and statin. Resumed beta-blocker as blood pressures stabilized. 5. History of gout: Continue allopurinol 6. Chronic renal failure: Baseline serum creatinine at 1.4? improved with hydration 7. Anemia of chronic disease: Monitor for now           8. H/o peripheral edema: has 2-3+ edema at baseline. Added TEDs & Lasix   Length of stay, days: [place] for DC home today as planned - no additional instructions or changes for plan post DC as outlined by PM&R APP in full DC Summary to follow  [name], MD 01/22/2018, 9:40 AM

## 2018-01-22 NOTE — Progress Notes (Signed)
Patient discharged to home per wheelchair accompanied by NT and wife. Per patient  And wife discharge instructions done yesterday no further questions.

## 2018-01-23 NOTE — Progress Notes (Signed)
Occupational Therapy Discharge Summary  Patient Details  Name: [name] MRN: [id number] Date of Birth: Nov 12, 1940  Patient has met 56 of 11 long term goals due to improved activity tolerance, improved balance, postural control, functional use of  LEFT upper and LEFT lower extremity, improved attention and improved coordination. Pt made steady progress with BADLs and functional transfers during this admission.  Pt continues to exhibit R gaze preference and requires mod verbal cues to attend to his L and LUE when not engaged. Pt continues to exhibit difficulty threading LLE and LUE into clothing and requires assistance.  Pt is min A for bathing/dressing tasks and toileting tasks. Pt performs stand pivot transfers at supervision level.  Pt requires tot A for use of LUE as stabilizer. Pt's wife has been present and participated in therapy and education.  Pt and family verbalized understanding of recommendation for 24 hour supervision and assistance. Patient to discharge at [facility] Assist level.  Patient's care partner is independent to provide the necessary physical and cognitive assistance at discharge.    Reasons goals not met: Pt needs max assist to use the LUE as a stabilizer with selfcare tasks.   Recommendation:  Patient will benefit from ongoing skilled OT services in home health setting to continue to advance functional skills in the area of BADL.  Feel pt will continue to benefit from continued comprehensive HHOT at discharge. He still needs min assist or greater for most selfcare tasks with supervision and use of the RW and other DME for toileting and shower transfers.  LUE functional use is still significantly limited and he needs further neuromuscular re-education to increased functional use to a gross assist level.    Equipment: Drop Arm BSC and shower seat  Reasons for discharge: treatment goals met and discharge from hospital  Patient/family agrees with progress made and goals  achieved: Yes  OT Discharge Vision Baseline Vision/History: Wears glasses Wears Glasses: At all times Patient Visual Report: No change from baseline Vision Assessment?: Yes Eye Alignment: Impaired (comment) Ocular Range of Motion: Restricted on the left Alignment/Gaze Preference: Gaze right Tracking/Visual Pursuits: Decreased smoothness of horizontal tracking;Decreased smoothness of vertical tracking;Left eye does not track medially Saccades: Additional head turns occurred during testing;Additional eye shifts occurred during testing Convergence: Impaired (comment) Visual Fields: Left visual field deficit Perception  Perception: Impaired Inattention/Neglect: Does not attend to left visual field;Does not attend to left side of body   Cognition Overall Cognitive Status: Impaired/Different from baseline Arousal/Alertness: Awake/alert Orientation Level: Oriented X4 Attention: Selective Sustained Attention: Impaired Sustained Attention Impairment: Verbal basic;Functional basic Selective Attention: Impaired Selective Attention Impairment: Verbal complex;Functional complex Memory: Impaired Memory Impairment: Decreased short term memory Awareness: Impaired Awareness Impairment: Anticipatory impairment Problem Solving: Impaired Problem Solving Impairment: Verbal complex;Functional complex Safety/Judgment: Impaired Comments: decreased safety awareness and decreased awareness of deficits Sensation Sensation Light Touch: Appears Intact Hot/Cold: Appears Intact Proprioception: Appears Intact Stereognosis: Not tested Coordination Gross Motor Movements are Fluid and Coordinated: No Fine Motor Movements are Fluid and Coordinated: No Motor  Motor Motor: Hemiplegia Motor - Discharge Observations: L hemi, UE>LE    Trunk/Postural Assessment  Cervical Assessment Cervical Assessment: Exceptions to WFL(decreased rotation) Thoracic Assessment Thoracic Assessment: Within Functional  Limits(rounded shoulders, kyphotic) Lumbar Assessment Lumbar Assessment: Exceptions to WFL(posterior pelvic tilt) Postural Control Righting Reactions: delayed  Balance Static Sitting Balance Static Sitting - Balance Support: No upper extremity supported;Feet supported Static Sitting - Level of Assistance: 5: Stand by assistance Dynamic Sitting Balance Dynamic Sitting - Balance  Support: No upper extremity supported;Feet supported;During functional activity Dynamic Sitting - Level of Assistance: 5: Stand by assistance Extremity/Trunk Assessment RUE Assessment RUE Assessment: Within Functional Limits LUE Assessment LUE Assessment: Exceptions to [facility] Passive Range of Motion (PROM) Comments: WFL General Strength Comments: 0/5 LUE Body System: Neuro Brunstrum levels for arm and hand: Arm;Hand Brunstrum level for arm: Stage III Synergy is performed voluntarily Brunstrum level for hand: Stage I Flaccidity   Leroy Libman 01/23/2018, 12:08 PM

## 2018-01-24 ENCOUNTER — Telehealth: Payer: Self-pay | Admitting: *Deleted

## 2018-01-24 ENCOUNTER — Ambulatory Visit (INDEPENDENT_AMBULATORY_CARE_PROVIDER_SITE_OTHER): Payer: Medicare Other | Admitting: *Deleted

## 2018-01-24 VITALS — BP 134/80 | HR 61

## 2018-01-24 DIAGNOSIS — I63511 Cerebral infarction due to unspecified occlusion or stenosis of right middle cerebral artery: Secondary | ICD-10-CM

## 2018-01-24 LAB — CUP PACEART INCLINIC DEVICE CHECK
Date Time Interrogation Session: 20191125150914
MDC IDC PG IMPLANT DT: 20191104

## 2018-01-24 NOTE — Telephone Encounter (Signed)
Anda Kraft PT called for POC approval of 2wk4, 1wk1.  Approval given.

## 2018-01-24 NOTE — Progress Notes (Signed)
Wound check appointment. Steri-strips removed prior to appointment by patient. Wound without redness or edema. Incision edges approximated, wound well healed. Normal device function. Battery status: good. R-waves 0.91mV. No symptom, pause, brady, or AF episodes. 5 tachy episodes--ECGs appear ST/SVT (gradual onset per plots) from 11/24 and 01/24/18. Patient reports being upset during time of episode on 11/24 and correlates episodes from 11/25 with having a near fall getting out of bed. No symptoms with episodes. Reviewed with SK who initially considered increasing patient's metoprolol, but ultimately made no changes after reviewing HR histograms. Patient and wife educated about wound care and Carelink monitor. lMonthly summary reports and ROV with GT PRN.

## 2018-01-25 ENCOUNTER — Telehealth: Payer: Self-pay

## 2018-01-25 NOTE — Telephone Encounter (Signed)
Transitional Care call  Patient name: [name]) DOB: (April 18, 1940) 1. Are you/is patient experiencing any problems since coming home? (NO) a. Are there any questions regarding any aspect of care? (NA) 2. Are there any questions regarding medications administration/dosing? (NO) a. Are meds being taken as prescribed? (NA) b. "Patient should review meds with caller to confirm"  3. Have there been any falls? (NO) 4. Has Home Health been to the house and/or have they contacted you? (YES) a. If not, have you tried to contact them? (NA) b. Can we help you contact them? (NA) 5. Are bowels and bladder emptying properly? (YES) a. Are there any unexpected incontinence issues? (NA) b. If applicable, is patient following bowel/bladder programs? (NA) 6. Any fevers, problems with breathing, unexpected pain? (NO) 7. Are there any skin problems or new areas of breakdown? (NO) 8. Has the patient/family member arranged specialty MD follow up (ie cardiology/neurology/renal/surgical/etc.)?  (YES) a. Can we help arrange? (NA) 9. Does the patient need any other services or support that we can help arrange? (NO) 10. Are caregivers following through as expected in assisting the patient? (YES) 11. Has the patient quit smoking, drinking alcohol, or using drugs as recommended? (NA)  Appointment date/time (02-03-2018 / 1040am), arrive time (1020am) and who it is with here Zella Ball then Dr. Letta Pate.) [place]

## 2018-02-03 ENCOUNTER — Encounter: Payer: Medicare Other | Attending: Registered Nurse | Admitting: Registered Nurse

## 2018-02-03 ENCOUNTER — Encounter: Payer: Self-pay | Admitting: Registered Nurse

## 2018-02-03 VITALS — BP 157/87 | HR 60 | Ht 74.0 in | Wt 286.0 lb

## 2018-02-03 DIAGNOSIS — I63511 Cerebral infarction due to unspecified occlusion or stenosis of right middle cerebral artery: Secondary | ICD-10-CM | POA: Insufficient documentation

## 2018-02-03 DIAGNOSIS — Z8249 Family history of ischemic heart disease and other diseases of the circulatory system: Secondary | ICD-10-CM | POA: Diagnosis not present

## 2018-02-03 DIAGNOSIS — I69354 Hemiplegia and hemiparesis following cerebral infarction affecting left non-dominant side: Secondary | ICD-10-CM | POA: Insufficient documentation

## 2018-02-03 DIAGNOSIS — R609 Edema, unspecified: Secondary | ICD-10-CM | POA: Diagnosis not present

## 2018-02-03 DIAGNOSIS — E785 Hyperlipidemia, unspecified: Secondary | ICD-10-CM | POA: Insufficient documentation

## 2018-02-03 DIAGNOSIS — Z951 Presence of aortocoronary bypass graft: Secondary | ICD-10-CM | POA: Diagnosis not present

## 2018-02-03 DIAGNOSIS — I2510 Atherosclerotic heart disease of native coronary artery without angina pectoris: Secondary | ICD-10-CM | POA: Insufficient documentation

## 2018-02-03 DIAGNOSIS — N183 Chronic kidney disease, stage 3 unspecified: Secondary | ICD-10-CM

## 2018-02-03 DIAGNOSIS — I10 Essential (primary) hypertension: Secondary | ICD-10-CM

## 2018-02-03 DIAGNOSIS — Z87891 Personal history of nicotine dependence: Secondary | ICD-10-CM | POA: Diagnosis not present

## 2018-02-03 DIAGNOSIS — G4733 Obstructive sleep apnea (adult) (pediatric): Secondary | ICD-10-CM | POA: Diagnosis not present

## 2018-02-03 DIAGNOSIS — I129 Hypertensive chronic kidney disease with stage 1 through stage 4 chronic kidney disease, or unspecified chronic kidney disease: Secondary | ICD-10-CM | POA: Insufficient documentation

## 2018-02-03 DIAGNOSIS — I252 Old myocardial infarction: Secondary | ICD-10-CM | POA: Diagnosis not present

## 2018-02-03 DIAGNOSIS — Z86718 Personal history of other venous thrombosis and embolism: Secondary | ICD-10-CM | POA: Diagnosis not present

## 2018-02-03 DIAGNOSIS — G8194 Hemiplegia, unspecified affecting left nondominant side: Secondary | ICD-10-CM | POA: Diagnosis not present

## 2018-02-03 NOTE — Progress Notes (Signed)
Subjective:    Patient ID: [name], male    DOB: 15-Apr-1940, 77 y.o.   MRN: [id number]  HPI: [name] is a 77 y.o. male who is here for Transitional care visit in follow up of his acute ischemic CVA, left hemiparesis, HTN, CKD stage 3 and peripheral edema. He has been home with [place] with [facility]. He denies pain and reports good appetite.    Pain Inventory Average Pain 0 Pain Right Now 0 My pain is na'  In the last 24 hours, has pain interfered with the following? General activity 0 Relation with others 0 Enjoyment of life 0 What TIME of day is your pain at its worst? na Sleep (in general) Good  Pain is worse with: na Pain improves with: na Relief from Meds: na  Mobility use a cane ability to climb steps?  yes do you drive?  no  Function retired I need assistance with the following:  dressing, meal prep, household duties and shopping  Neuro/Psych weakness numbness trouble walking  Prior Studies Any changes since last visit?  no  Physicians involved in your care Any changes since last visit?  no   Family History  Problem Relation Age of Onset  . Gout Mother   . Stroke Mother   . Coronary artery disease Mother   . Hypertension Mother   . Arthritis Mother   . Coronary artery disease Father   . Arthritis Father   . Gout Father   . Stroke Father   . Hypertension Father   . Coronary artery disease Brother   . Arthritis Brother   . Gout Brother   . Stroke Brother   . Hypertension Brother   . Coronary artery disease Brother   . Arthritis Brother   . Gout Brother   . Stroke Brother   . Hypertension Brother   . Coronary artery disease Unknown        significant in multiple family members   Social History   Socioeconomic History  . Marital status: Married    Spouse name: [name]  . Number of children: Not on file  . Years of education: Not on file  . Highest education level: Not on file  Occupational History  . Not on  file  Social Needs  . Financial resource strain: Not on file  . Food insecurity:    Worry: Not on file    Inability: Not on file  . Transportation needs:    Medical: Not on file    Non-medical: Not on file  Tobacco Use  . Smoking status: Former Smoker    Types: Cigars  . Smokeless tobacco: Never Used  . Tobacco comment: used to smoke cigars, 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, none since  Substance and Sexual Activity  . Alcohol use: No  . Drug use: No  . Sexual activity: Not on file  Lifestyle  . Physical activity:    Days per week: Not on file    Minutes per session: Not on file  . Stress: Not on file  Relationships  . Social connections:    Talks on phone: Not on file    Gets together: Not on file    Attends religious service: Not on file    Active member of club or organization: Not on file    Attends meetings of clubs or organizations: Not on file    Relationship status: Not on file  Other Topics Concern  . Not  on file  Social History Narrative  . Not on file   Past Surgical History:  Procedure Laterality Date  . CORONARY ARTERY BYPASS GRAFT  2005   LIMA to LAD, SVG to OM1 and OM 2, RCA.   [name] left inguinal hernia repair    . LOOP RECORDER INSERTION N/A 01/03/2018   Procedure: LOOP RECORDER INSERTION;  Surgeon: Evans Lance, MD;  Location: [place] [facility];  Service: Cardiovascular;  Laterality: N/A;  . TEE WITHOUT CARDIOVERSION N/A 12/11/2016   Procedure: TRANSESOPHAGEAL ECHOCARDIOGRAM (TEE);  Surgeon: [name], MD;  Location: [facility] ENDOSCOPY;  Service: Cardiovascular;  Laterality: N/A;  . TEE WITHOUT CARDIOVERSION N/A 01/03/2018   Procedure: TRANSESOPHAGEAL ECHOCARDIOGRAM (TEE);  Surgeon: Dorothy Spark, MD;  Location: [facility] ENDOSCOPY;  Service: Cardiovascular;  Laterality: N/A;   Past Medical History:  Diagnosis Date  . CAD (coronary artery disease)    s/p cath in 2005 and CABG x4  . Cataract   . DJD (degenerative joint disease)   . DJD (degenerative  joint disease)   . Fluid retention    mild  . H/O: gout   . History of blood clots   . HTN (hypertension)   . Hyperlipidemia   . Hyperlipidemia   . Myocardial infarction ([place]) 2009  . Obesity    with reduction  . OSA (obstructive sleep apnea)    AHI 98/hr  . Renal insufficiency   . Sleep apnea   . Stroke ([place]) 2000   There were no vitals taken for this visit.  Opioid Risk Score:   Fall Risk Score:  `1  Depression screen PHQ 2/9  Depression screen PHQ 2/9 01/13/2017  Decreased Interest 0  Down, Depressed, Hopeless 0  PHQ - 2 Score 0     Review of Systems  Constitutional: Negative.   HENT: Negative.   Eyes: Negative.   Respiratory: Negative.   Cardiovascular: Negative.   Gastrointestinal: Negative.   Endocrine: Negative.   Genitourinary: Negative.   Musculoskeletal: Positive for gait problem.  Skin: Negative.   Allergic/Immunologic: Negative.   Neurological: Positive for weakness and numbness.  Hematological: Negative.   Psychiatric/Behavioral: Negative.   All other systems reviewed and are negative.      Objective:   Physical Exam  Constitutional: He is oriented to person, place, and time. He appears well-developed and well-nourished.  HENT:  Head: Normocephalic and atraumatic.  Neck: Normal range of motion. Neck supple.  Cardiovascular: Normal rate and regular rhythm.  Pulmonary/Chest: Effort normal and breath sounds normal.  Musculoskeletal:  Normal Muscle Bulk and Muscle Testing Reveals:  Upper Extremities: Right: Full ROM and Muscle Strength 4/5 Left: Decreased ROM 20 Degrees and Muscle Strength 1/5 Lower Extremities: Full ROM and Muscle Strength 4/5 Arises from Table with ease using walker for support Normal Based Gait  Neurological: He is alert and oriented to person, place, and time.  Nursing note and vitals reviewed.         Assessment & Plan:  1. Acute ischemic CVA: Continue Home Health Therapies. Has a scheduled appointment with  [facility]. 2, Carotid Plaque: He will call Dr. Donzetta Matters office for a [place] appointment. He verbalizes understanding.  3. Left Hemiparesis: Continue with Home Health Therapies. Continue to Monitor.  4. HTN: Continue current medication regimen. PCP Following.  5. CKD: Stage 3: PCP Following.  6.Peripheral Edema: Continue current medication regimen. PCP Following.  20  minutes of face to face patient care time was spent during this visit. All questions were encouraged  and answered.  F/U in 4-6 weeks with Dr. Letta Pate

## 2018-02-03 NOTE — Patient Instructions (Addendum)
Please call Dr Donzetta Matters office and schedule Hospital Follow up appointment : [phone]

## 2018-02-07 ENCOUNTER — Ambulatory Visit (INDEPENDENT_AMBULATORY_CARE_PROVIDER_SITE_OTHER): Payer: Medicare Other

## 2018-02-07 DIAGNOSIS — I63511 Cerebral infarction due to unspecified occlusion or stenosis of right middle cerebral artery: Secondary | ICD-10-CM

## 2018-02-08 ENCOUNTER — Telehealth: Payer: Self-pay | Admitting: Cardiology

## 2018-02-08 NOTE — Progress Notes (Signed)
Carelink Summary Report / Loop Recorder 

## 2018-02-08 NOTE — Telephone Encounter (Signed)
Spoke w/ pt wife and requested that he send a manual transmission b/c his home monitor has not updated in at least 14 days.   

## 2018-02-19 ENCOUNTER — Other Ambulatory Visit (HOSPITAL_COMMUNITY): Payer: Self-pay | Admitting: Physical Medicine and Rehabilitation

## 2018-02-21 ENCOUNTER — Telehealth: Payer: Self-pay | Admitting: *Deleted

## 2018-02-21 ENCOUNTER — Telehealth: Payer: Self-pay

## 2018-02-21 DIAGNOSIS — I63511 Cerebral infarction due to unspecified occlusion or stenosis of right middle cerebral artery: Secondary | ICD-10-CM

## 2018-02-21 DIAGNOSIS — G8194 Hemiplegia, unspecified affecting left nondominant side: Secondary | ICD-10-CM

## 2018-02-21 NOTE — Telephone Encounter (Signed)
Please place orders for outpt PT, OT at neuro rehab.  I do not have access to Xrays so Dr Marlou Sa will need to be the point person in regards to the wrist pain and swelling.  I would tell PT, OT to not bear weight on his painful wrist until cleared by Dr Marlou Sa

## 2018-02-21 NOTE — Telephone Encounter (Signed)
Mr Schopf has done so well he is ready for d/c from [facility] PT/OT and needs referral to outpt neuro rehab. (referral placed). Also, let Dr Letta Pate know that he is complaining with his left writst being painful and swollen.  Izora Gala PT put a brace on it  And he has seen Dr Kevan Ny and is having an x ray today of that wrist.

## 2018-02-21 NOTE — Telephone Encounter (Signed)
I tried to call patient on home number and mailbox is full. I left patient a message to call the device clinic back. Will discuss tachy episodes from 02/10/18 and 02/19/18.

## 2018-02-22 ENCOUNTER — Telehealth: Payer: Self-pay | Admitting: *Deleted

## 2018-02-22 NOTE — Telephone Encounter (Signed)
Called patient d/t tachy episode recorded on 02/21/18 @ 0622 x42sec. Patient denies any sx's during the time of the episode. Patient reports medication compliance.  I asked patient to send in manual transmission from his home monitor. Wife attempted to send transmission while on the telephone, but received the 3230 error code. I instructed patient/wife to call the company to have this resolved. Phone number given. Wife verbalized understanding.

## 2018-02-24 NOTE — Telephone Encounter (Signed)
LMOVM for pt to return call 

## 2018-02-25 NOTE — Telephone Encounter (Signed)
Spoke with patient and he stated that he did not have any issues on December 12 or December 21. Informed pt that I would forward information on to the nurse and if she had any further recommendations she would call back otherwise no news is good news. Pt verbalized understanding.

## 2018-03-01 ENCOUNTER — Telehealth: Payer: Self-pay | Admitting: Cardiology

## 2018-03-01 NOTE — Telephone Encounter (Signed)
Spoke w/ pt and requested that he send a manual transmission w/ his home monitor. Manual transmission was received.

## 2018-03-01 NOTE — Telephone Encounter (Signed)
Tachy episode reviewed and noted. Will review with MD for changes, if any are made.

## 2018-03-01 NOTE — Telephone Encounter (Signed)
Opened in error

## 2018-03-04 ENCOUNTER — Telehealth: Payer: Self-pay

## 2018-03-04 NOTE — Telephone Encounter (Signed)
See phone note opened 03/04/18 by Dollene Primrose, RN.

## 2018-03-04 NOTE — Telephone Encounter (Signed)
LVM for return call to discuss recent Linq transmission and medication changes recommended by Dr Lovena Le.

## 2018-03-07 ENCOUNTER — Telehealth: Payer: Self-pay | Admitting: Internal Medicine

## 2018-03-07 MED ORDER — APIXABAN 5 MG PO TABS: 5.0000 mg | ORAL_TABLET | Freq: Two times a day (BID) | ORAL | 6 refills | Status: DC

## 2018-03-07 NOTE — Telephone Encounter (Signed)
Left pt 2 weeks supply of samples of Eliquis and a 30 day free coupon at the front desk for pt to pick up.

## 2018-03-07 NOTE — Telephone Encounter (Signed)
Spoke with patient's wife and went over Dr Tanna Furry recommendations.  He is still taking Metoprolol 25 mg bid and has not been on Plavix.  She says she does not know why he does not have this medication.  I let her know we were going to stop Plavix and start Eliquis 5 mg bid.  This was called into [facility] on [address].  She is aware to come by the office to pick up 30 day free card and samples to get started.

## 2018-03-08 ENCOUNTER — Ambulatory Visit: Payer: Medicare Other | Admitting: Podiatry

## 2018-03-10 ENCOUNTER — Ambulatory Visit (INDEPENDENT_AMBULATORY_CARE_PROVIDER_SITE_OTHER): Payer: Medicare Other

## 2018-03-10 DIAGNOSIS — I63511 Cerebral infarction due to unspecified occlusion or stenosis of right middle cerebral artery: Secondary | ICD-10-CM

## 2018-03-10 LAB — CUP PACEART REMOTE DEVICE CHECK
Date Time Interrogation Session: 20200109203650
MDC IDC PG IMPLANT DT: 20191104

## 2018-03-11 ENCOUNTER — Ambulatory Visit: Payer: Medicare Other | Admitting: Physical Medicine & Rehabilitation

## 2018-03-11 ENCOUNTER — Encounter: Payer: Medicare Other | Attending: Registered Nurse

## 2018-03-11 ENCOUNTER — Encounter: Payer: Self-pay | Admitting: Physical Medicine & Rehabilitation

## 2018-03-11 VITALS — BP 168/109 | HR 74 | Ht 74.0 in | Wt 265.0 lb

## 2018-03-11 DIAGNOSIS — Z87891 Personal history of nicotine dependence: Secondary | ICD-10-CM | POA: Insufficient documentation

## 2018-03-11 DIAGNOSIS — I2510 Atherosclerotic heart disease of native coronary artery without angina pectoris: Secondary | ICD-10-CM | POA: Insufficient documentation

## 2018-03-11 DIAGNOSIS — I69354 Hemiplegia and hemiparesis following cerebral infarction affecting left non-dominant side: Secondary | ICD-10-CM

## 2018-03-11 DIAGNOSIS — I252 Old myocardial infarction: Secondary | ICD-10-CM | POA: Diagnosis not present

## 2018-03-11 DIAGNOSIS — G4733 Obstructive sleep apnea (adult) (pediatric): Secondary | ICD-10-CM | POA: Diagnosis not present

## 2018-03-11 DIAGNOSIS — I129 Hypertensive chronic kidney disease with stage 1 through stage 4 chronic kidney disease, or unspecified chronic kidney disease: Secondary | ICD-10-CM | POA: Insufficient documentation

## 2018-03-11 DIAGNOSIS — N183 Chronic kidney disease, stage 3 (moderate): Secondary | ICD-10-CM | POA: Diagnosis not present

## 2018-03-11 DIAGNOSIS — Z951 Presence of aortocoronary bypass graft: Secondary | ICD-10-CM | POA: Insufficient documentation

## 2018-03-11 DIAGNOSIS — I69391 Dysphagia following cerebral infarction: Secondary | ICD-10-CM

## 2018-03-11 DIAGNOSIS — Z8249 Family history of ischemic heart disease and other diseases of the circulatory system: Secondary | ICD-10-CM | POA: Diagnosis not present

## 2018-03-11 DIAGNOSIS — Z86718 Personal history of other venous thrombosis and embolism: Secondary | ICD-10-CM | POA: Diagnosis not present

## 2018-03-11 DIAGNOSIS — I63511 Cerebral infarction due to unspecified occlusion or stenosis of right middle cerebral artery: Secondary | ICD-10-CM | POA: Insufficient documentation

## 2018-03-11 DIAGNOSIS — E785 Hyperlipidemia, unspecified: Secondary | ICD-10-CM | POA: Insufficient documentation

## 2018-03-11 NOTE — Patient Instructions (Signed)
Kegel Exercises  Kegel exercises help strengthen the muscles that support the rectum, vagina, small intestine, bladder, and uterus. Doing Kegel exercises can help:   Improve bladder and bowel control.   Improve sexual response.   Reduce problems and discomfort during pregnancy.  Kegel exercises involve squeezing your pelvic floor muscles, which are the same muscles you squeeze when you try to stop the flow of urine. The exercises can be done while sitting, standing, or lying down, but it is best to vary your position.  Exercises  1. Squeeze your pelvic floor muscles tight. You should feel a tight lift in your rectal area. If you are a male, you should also feel a tightness in your vaginal area. Keep your stomach, buttocks, and legs relaxed.  2. Hold the muscles tight for up to 10 seconds.  3. Relax your muscles.  Repeat this exercise 50 times a day or as many times as told by your health care provider. Continue to do this exercise for at least 4-6 weeks or for as long as told by your health care provider.  This information is not intended to replace advice given to you by your health care provider. Make sure you discuss any questions you have with your health care provider.  Document Released: 02/03/2012 Document Revised: 06/29/2016 Document Reviewed: 01/06/2015  Elsevier Interactive Patient Education  2019 Elsevier Inc[employer]

## 2018-03-11 NOTE — Progress Notes (Signed)
Subjective:    Patient ID: [name], male    DOB: 05-Feb-1941, 78 y.o.   MRN: [id number] 78 year old male with history of CAD, DJD, OSA, DVTs, CVA who was admitted on 01/01/18 with acute onset of left facial weakness, dysarthria, left-sided weakness and fall.  CT perfusion done revealing small acute right frontal/MCA penumbra and CTA head neck showed moderate cerebral artery atherosclerosis with patient's mid stenosis right M2 origin.  MRI brain showed multiple right CVA.  Patient has had issues with lethargy as well as difficulty handling secretion and he was placed on dysphagia 1 diet with nectar liquids.  Carotid Dopplers done showing large soft plaque and right ICA bifurcation however no significant stenosis noted.  TEE was negative for thrombus or PFO and loop recorder placed by Dr. Lovena Le.  Neurology recommended aspirin Plavix for embolic stroke felt to be from a right-ICA soft plaque versus cardioembolic source.  Patient with residual left-sided weakness with left inattention as well as cognitive deficits affecting ADLs and mobility.  CIR was recommended for follow-up therapy Admit date: 01/05/2018 Discharge date: 01/22/2018 HPI  Patient returns today accompanied by his wife.  Home health have finished up 1 or 2 weeks ago but has not managed an appointment yet with outpatient therapy. Dressing and bathing except for donning shirt, and socks Left upper extremity weakness remains an issue.  He does have some mild weakness of the left lower extremity but he is able to walk without an assistive device  Afib noted on Loop recorder now on Eliquis  Bowels and bladder ok  No falls  Remembers me from hospital   Pain Inventory Average Pain 8 Pain Right Now 8 My pain is na  In the last 24 hours, has pain interfered with the following? General activity 0 Relation with others 0 Enjoyment of life 0 What TIME of day is your pain at its worst? na Sleep (in general) Good  Pain is worse  with: na Pain improves with: na Relief from Meds: na  Mobility walk without assistance use a cane ability to climb steps?  yes do you drive?  no  Function not employed: date last employed .  Neuro/Psych No problems in this area  Prior Studies Any changes since last visit?  no  Physicians involved in your care Any changes since last visit?  no   Family History  Problem Relation Age of Onset  . Gout Mother   . Stroke Mother   . Coronary artery disease Mother   . Hypertension Mother   . Arthritis Mother   . Coronary artery disease Father   . Arthritis Father   . Gout Father   . Stroke Father   . Hypertension Father   . Coronary artery disease Brother   . Arthritis Brother   . Gout Brother   . Stroke Brother   . Hypertension Brother   . Coronary artery disease Brother   . Arthritis Brother   . Gout Brother   . Stroke Brother   . Hypertension Brother   . Coronary artery disease Unknown        significant in multiple family members   Social History   Socioeconomic History  . Marital status: Married    Spouse name: [name]  . Number of children: Not on file  . Years of education: Not on file  . Highest education level: Not on file  Occupational History  . Not on file  Social Needs  . Financial resource strain:  Not on file  . Food insecurity:    Worry: Not on file    Inability: Not on file  . Transportation needs:    Medical: Not on file    Non-medical: Not on file  Tobacco Use  . Smoking status: Former Smoker    Types: Cigars  . Smokeless tobacco: Never Used  . Tobacco comment: used to smoke cigars, 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, none since  Substance and Sexual Activity  . Alcohol use: No  . Drug use: No  . Sexual activity: Not on file  Lifestyle  . Physical activity:    Days per week: Not on file    Minutes per session: Not on file  . Stress: Not on file  Relationships  . Social connections:    Talks on phone: Not on file    Gets together:  Not on file    Attends religious service: Not on file    Active member of club or organization: Not on file    Attends meetings of clubs or organizations: Not on file    Relationship status: Not on file  Other Topics Concern  . Not on file  Social History Narrative  . Not on file   Past Surgical History:  Procedure Laterality Date  . CORONARY ARTERY BYPASS GRAFT  2005   LIMA to LAD, SVG to OM1 and OM 2, RCA.   [name] left inguinal hernia repair    . LOOP RECORDER INSERTION N/A 01/03/2018   Procedure: LOOP RECORDER INSERTION;  Surgeon: Evans Lance, MD;  Location: [facility];  Service: Cardiovascular;  Laterality: N/A;  . TEE WITHOUT CARDIOVERSION N/A 12/11/2016   Procedure: TRANSESOPHAGEAL ECHOCARDIOGRAM (TEE);  Surgeon: [name], MD;  Location: [facility] ENDOSCOPY;  Service: Cardiovascular;  Laterality: N/A;  . TEE WITHOUT CARDIOVERSION N/A 01/03/2018   Procedure: TRANSESOPHAGEAL ECHOCARDIOGRAM (TEE);  Surgeon: Dorothy Spark, MD;  Location: [facility] ENDOSCOPY;  Service: Cardiovascular;  Laterality: N/A;   Past Medical History:  Diagnosis Date  . CAD (coronary artery disease)    s/p cath in 2005 and CABG x4  . Cataract   . DJD (degenerative joint disease)   . DJD (degenerative joint disease)   . Fluid retention    mild  . H/O: gout   . History of blood clots   . HTN (hypertension)   . Hyperlipidemia   . Hyperlipidemia   . Myocardial infarction ([place]) 2009  . Obesity    with reduction  . OSA (obstructive sleep apnea)    AHI 98/hr  . Renal insufficiency   . Sleep apnea   . Stroke ([place]) 2000   BP (!) 168/109   Pulse 74   Ht 6\' 2"  (1.88 m)   Wt 265 lb (120.2 kg)   SpO2 98%   BMI 34.02 kg/m   Opioid Risk Score:   Fall Risk Score:  `1  Depression screen PHQ 2/9  Depression screen PHQ 2/9 01/13/2017  Decreased Interest 0  Down, Depressed, Hopeless 0  PHQ - 2 Score 0     Review of Systems  Constitutional: Negative.   HENT: Negative.   Eyes: Negative.     Respiratory: Negative.   Cardiovascular: Negative.   Gastrointestinal: Negative.   Endocrine: Negative.   Genitourinary: Negative.   Musculoskeletal: Positive for arthralgias.  Skin: Positive for rash.  Allergic/Immunologic: Negative.   Neurological: Negative.   Hematological: Negative.   Psychiatric/Behavioral: Negative.   All other systems reviewed and are negative.  Objective:   Physical Exam Vitals signs and nursing note reviewed.  Constitutional:      Appearance: Normal appearance.  HENT:     Head: Normocephalic and atraumatic.     Nose: Nose normal.     Mouth/Throat:     Mouth: Mucous membranes are moist.  Eyes:     Extraocular Movements: Extraocular movements intact.     Pupils: Pupils are equal, round, and reactive to light.  Cardiovascular:     Rate and Rhythm: Normal rate and regular rhythm.     Heart sounds: Normal heart sounds.  Neurological:     Mental Status: He is alert and oriented to person, place, and time.     Comments: Motor strength is 3- in the left deltoid bicep tricep finger flexors are 3 finger extensors 3- 3 at the left hip flexor 4 at the left knee extensor 4 at the left ankle dorsiflexor 5/5 in the right deltoid, bicep, tricep, grip, hip flexor, knee extensor, ankle dorsiflexor and plantar flexor  Psychiatric:        Mood and Affect: Mood normal.        Behavior: Behavior normal.           Assessment & Plan:  1.  History of right basal ganglia infarct with residual left upper extremity groin lower extremity weakness.  He has completed inpatient rehabilitation and home health.  He does have residual problems with left-sided weakness as well as functional problems with dressing.  We will make referral to outpatient PT OT Recheck in 6 weeks. We discussed driving with his wife in a empty parking lot for practice for the next several weeks  We reviewed the patient's medications including his medications for stroke prevention.  He is no  longer taking his aspirin and is on Eliquis twice a day  Over half of the 25 min visit was spent counseling and coordinating care.

## 2018-03-11 NOTE — Progress Notes (Signed)
Carelink Summary Report / Loop Recorder 

## 2018-03-15 ENCOUNTER — Ambulatory Visit: Payer: Medicare Other | Admitting: Neurology

## 2018-03-15 ENCOUNTER — Encounter: Payer: Self-pay | Admitting: Neurology

## 2018-03-15 VITALS — BP 156/95 | HR 58 | Ht 74.0 in | Wt 270.8 lb

## 2018-03-15 DIAGNOSIS — I63411 Cerebral infarction due to embolism of right middle cerebral artery: Secondary | ICD-10-CM | POA: Diagnosis not present

## 2018-03-15 DIAGNOSIS — I4892 Unspecified atrial flutter: Secondary | ICD-10-CM

## 2018-03-15 NOTE — Progress Notes (Signed)
[name] [address] [place]. [place] [address] [phone]       OFFICE CONSULT NOTE  Mr. [name] Date of Birth:  08-26-1940 Medical Record Number:  [id number]   Referring MD: Rosalin Hawking  Reason for Referral: Stroke  HPI: Mr. [name] is a 78 year old pleasant African-American male seen today for initial office consultation visit for stroke.  He is accompanied by his wife.  History is obtained from them and review of electronic medical records.  I have personally reviewed imaging films and hospital stroke work-up.  He presented on 01/01/2018 with sudden onset of left facial droop, slurred speech and left hemiplegia arm greater than leg.  He woke up from sleep and wife heard a loud sound of him falling on the ground and left-sided weakness was noted.  He presented beyond time window for TPA.  His past medical history included coronary artery disease status post CABG x4, hypertension, hyperlipidemia, myocardial infarction, obesity, prior stroke and history of blood clots.  CT head on admission showed no acute abnormality but CT perfusion showed a small right frontal MCA penumbra though limited by motion artifact.  CT angiogram showed no emergent labs vessel occlusion.  CT angiogram of the neck as well showed no large vessel narrowing in the neck but showed mild stenosis of right vertebral artery origin.  MRI scan of the brain subsequently showed acute right MCA infarcts involving right frontal operculum as well as along right frontal and parietal lobes.  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  CT scan of the cervical spine showed moderate canal stenosis at C5-6 and C6-7 with neural foraminal narrowing from C4-5 to C6-7 of moderate degree.  Transthoracic echo showed normal ejection fraction.  Lower extremity venous Dopplers were negative for DVT.  Transesophageal echocardiogram showed no cardiac  source of embolism or PFO.  He had loop recorder inserted.  LDL cholesterol was elevated at 126 mg percent and hemoglobin A1c was 5.8.  Patient was started on dual antiplatelet therapy for 3 weeks but was found to have atrial flutter on loop recorder monitoring on 03/10/2018 and aspirin was switched to Eliquis since then.  He seems to be tolerating Eliquis well without bleeding or bruising.  He states his speech and facial droop has recovered completely.  He still however has significant distal left hand weakness.  He is also complaining of soreness and pain in his left neck which is also limiting his therapy.  He is currently participating in home physical and occupational therapy and plans to switch to outpatient therapy soon.  The patient does admit to snoring as well as excessive daytime sleepiness and tiredness.  He has not been recently evaluated for sleep apnea though his states vaguely that several years ago he was tested for it has never been on treatment.  He has recently seen Dr. Letta Pate from rehab.  He has an upcoming appointment to see a cardiologist to discuss his new diagnosis of atrial flutter.  He has history of right thalamic infarct on imaging and family history of stroke in his mother, father and brother.  ROS:   14 system review of systems is positive for neck pain, shoulder pain, hand weakness and all other systems negative  PMH:  Past Medical History:  Diagnosis Date  . CAD (coronary artery disease)    s/p cath in 2005 and CABG x4  . Cataract   . DJD (  degenerative joint disease)   . DJD (degenerative joint disease)   . Fluid retention    mild  . H/O: gout   . History of blood clots   . HTN (hypertension)   . Hyperlipidemia   . Hyperlipidemia   . Myocardial infarction ([place]) 2009  . Obesity    with reduction  . OSA (obstructive sleep apnea)    AHI 98/hr  . Renal insufficiency   . Sleep apnea   . Stroke [facility]) 2000    Social History:  Social History   Socioeconomic  History  . Marital status: Married    Spouse name: [name]  . Number of children: Not on file  . Years of education: Not on file  . Highest education level: Not on file  Occupational History  . Not on file  Social Needs  . Financial resource strain: Not on file  . Food insecurity:    Worry: Not on file    Inability: Not on file  . Transportation needs:    Medical: Not on file    Non-medical: Not on file  Tobacco Use  . Smoking status: Former Smoker    Types: Cigars  . Smokeless tobacco: Never Used  . Tobacco comment: used to smoke cigars, 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, none since  Substance and Sexual Activity  . Alcohol use: No  . Drug use: No  . Sexual activity: Not on file  Lifestyle  . Physical activity:    Days per week: Not on file    Minutes per session: Not on file  . Stress: Not on file  Relationships  . Social connections:    Talks on phone: Not on file    Gets together: Not on file    Attends religious service: Not on file    Active member of club or organization: Not on file    Attends meetings of clubs or organizations: Not on file    Relationship status: Not on file  . Intimate partner violence:    Fear of current or ex partner: Not on file    Emotionally abused: Not on file    Physically abused: Not on file    Forced sexual activity: Not on file  Other Topics Concern  . Not on file  Social History Narrative  . Not on file    Medications:   Current Outpatient Medications on File Prior to Visit  Medication Sig Dispense Refill  . acetaminophen (TYLENOL) 325 MG tablet Take 1-2 tablets (325-650 mg total) by mouth every 4 (four) hours as needed for mild pain.    [name] allopurinol (ZYLOPRIM) 100 MG tablet Take 200 mg by mouth daily.     [name] amLODipine (NORVASC) 5 MG tablet TAKE ONE TABLET BY MOUTH ONCE DAILY. 30 tablet 0  . apixaban (ELIQUIS) 5 MG TABS tablet Take 1 tablet (5 mg total) by mouth 2 (two) times daily. 60 tablet 6  . furosemide (LASIX) 20 MG tablet  Take 0.5 tablets (10 mg total) by mouth daily. 30 tablet 0  . Menthol-Methyl Salicylate (MUSCLE RUB) 10-15 % CREA Apply 1 application topically 4 (four) times daily -  with meals and at bedtime. To neck and shoulders  0  . metoprolol tartrate (LOPRESSOR) 25 MG tablet TAKE ONE TABLET BY MOUTH TWICE DAILY (Patient taking differently: Take 25 mg by mouth 2 (two) times daily. ) 30 tablet 0  . montelukast (SINGULAIR) 10 MG tablet Take 10 mg by mouth at bedtime.    [name]  niacin (NIASPAN) 500 MG CR tablet Take 500 mg by mouth at bedtime.      . pantoprazole (PROTONIX) 40 MG tablet Take 1 tablet (40 mg total) by mouth daily. 30 tablet 0  . polyethylene glycol (MIRALAX / GLYCOLAX) packet Take 17 g by mouth daily. 60 each 0  . rosuvastatin (CRESTOR) 20 MG tablet Take 1 tablet (20 mg total) by mouth daily at 6 PM.    . tamsulosin (FLOMAX) 0.4 MG CAPS capsule Take 1 capsule (0.4 mg total) by mouth daily. 30 capsule    No current facility-administered medications on file prior to visit.     Allergies:   Allergies  Allergen Reactions  . Ramipril Cough    Physical Exam General: Obese elderly African-American male, seated, in no evident distress Head: head normocephalic and atraumatic.   Neck: supple with no carotid or supraclavicular bruits Cardiovascular: regular rate and rhythm, no murmurs Musculoskeletal: no deformity Skin:  no rash/petichiae Vascular:  Normal pulses all extremities  Neurologic Exam Mental Status: Awake and fully alert. Oriented to place and time. Recent and remote memory intact. Attention span, concentration and fund of knowledge appropriate. Mood and affect appropriate.  Cranial Nerves: Fundoscopic exam reveals sharp disc margins. Pupils equal, briskly reactive to light. Extraocular movements full without nystagmus. Visual fields full to confrontation. Hearing intact. Facial sensation intact. Face, tongue, palate moves normally and symmetrically.  Motor: [name]  Mild left upper extremity  weakness 4/5 strength proximally and 3/5 strength in the left grip and intrinsic hand muscles.  Left shoulder movements limited due to neck pain. Sensory.: intact to touch , pinprick , position and vibratory sensation.  Coordination: Rapid alternating movements normal in all extremities. Finger-to-nose and heel-to-shin performed accurately bilaterally. Gait and Station: Arises from chair without difficulty. Stance is normal. Gait demonstrates normal stride length and balance . Not able to heel, toe and tandem walk .  Reflexes: 1+ and symmetric. Toes downgoing.   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  Vascular risk factors of obesity, hypertension, hyperlipidemia,atrial flutter, and suspected sleep apnea     PLAN: I had a long d/w patient and his wife about his recent embolic stroke, diagnosis of atrial flutter risk for recurrent stroke/TIAs, personally independently reviewed imaging studies and stroke evaluation results and answered questions.Continue Eliquis (apixaban) daily  for secondary stroke prevention and maintain strict control of hypertension with blood pressure goal below 130/90, diabetes with hemoglobin A1c goal below 6.5% and lipids with LDL cholesterol goal below 70 mg/dL. I also advised the patient to eat a healthy diet with plenty of whole grains, cereals, fruits and vegetables, exercise regularly and maintain ideal body weight .  Check polysomnogram for sleep apnea.  I have advised the patient to use heating pad and local muscle relaxation cream for his muscular left neck pain.  If this does not improve I have advised him to see Dr. Letta Pate from rehabilitation who is already seeing.  Greater than 50% time during this 50-minute consultation visit was spent on counseling and coordination of care about his embolic stroke, recent  diagnosis of atrial flutter, discussion about anticoagulation and stroke prevention and answering questions.  Followup in the future with my nurse practitioner Janett Billow in 3 months or call earlier if necessary. Antony Contras, MD  [facility] [address] [place],  [address]  Phone [phone]  Fax [phone] Note: This document was prepared with digital dictation and possible smart phrase technology. Any transcriptional errors that result from this process are unintentional.

## 2018-03-15 NOTE — Patient Instructions (Signed)
I had a long d/w patient and his wife about his recent embolic stroke, diagnosis of atrial flutter risk for recurrent stroke/TIAs, personally independently reviewed imaging studies and stroke evaluation results and answered questions.Continue Eliquis (apixaban) daily  for secondary stroke prevention and maintain strict control of hypertension with blood pressure goal below 130/90, diabetes with hemoglobin A1c goal below 6.5% and lipids with LDL cholesterol goal below 70 mg/dL. I also advised the patient to eat a healthy diet with plenty of whole grains, cereals, fruits and vegetables, exercise regularly and maintain ideal body weight .  Check polysomnogram for sleep apnea.  I have advised the patient to use heating pad and local muscle relaxation cream for his muscular left neck pain.  If this does not improve I have advised him to see Dr. Letta Pate from rehabilitation who is already seeing.  Followup in the future with my nurse practitioner Janett Billow in 3 months or call earlier if necessary.  Stroke Prevention Some medical conditions and behaviors are associated with a higher chance of having a stroke. You can help prevent a stroke by making nutrition, lifestyle, and other changes, including managing any medical conditions you may have. What nutrition changes can be made?   Eat healthy foods. You can do this by: ? Choosing foods high in fiber, such as fresh fruits and vegetables and whole grains. ? Eating at least 5 or more servings of fruits and vegetables a day. Try to fill half of your plate at each meal with fruits and vegetables. ? Choosing lean protein foods, such as lean cuts of meat, poultry without skin, fish, tofu, beans, and nuts. ? Eating low-fat dairy products. ? Avoiding foods that are high in salt (sodium). This can help lower blood pressure. ? Avoiding foods that have saturated fat, trans fat, and cholesterol. This can help prevent high cholesterol. ? Avoiding processed and premade  foods.  Follow your health care provider's specific guidelines for losing weight, controlling high blood pressure (hypertension), lowering high cholesterol, and managing diabetes. These may include: ? Reducing your daily calorie intake. ? Limiting your daily sodium intake to 1,500 milligrams (mg). ? Using only healthy fats for cooking, such as olive oil, canola oil, or sunflower oil. ? Counting your daily carbohydrate intake. What lifestyle changes can be made?  Maintain a healthy weight. Talk to your health care provider about your ideal weight.  Get at least 30 minutes of moderate physical activity at least 5 days a week. Moderate activity includes brisk walking, biking, and swimming.  Do not use any products that contain nicotine or tobacco, such as cigarettes and e-cigarettes. If you need help quitting, ask your health care provider. It may also be helpful to avoid exposure to secondhand smoke.  Limit alcohol intake to no more than 1 drink a day for nonpregnant women and 2 drinks a day for men. One drink equals 12 oz of beer, 5 oz of wine, or 1 oz of hard liquor.  Stop any illegal drug use.  Avoid taking birth control pills. Talk to your health care provider about the risks of taking birth control pills if: ? You are over 75 years old. ? You smoke. ? You get migraines. ? You have ever had a blood clot. What other changes can be made?  Manage your cholesterol levels. ? Eating a healthy diet is important for preventing high cholesterol. If cholesterol cannot be managed through diet alone, you may also need to take medicines. ? Take any prescribed medicines to control  your cholesterol as told by your health care provider.  Manage your diabetes. ? Eating a healthy diet and exercising regularly are important parts of managing your blood sugar. If your blood sugar cannot be managed through diet and exercise, you may need to take medicines. ? Take any prescribed medicines to control  your diabetes as told by your health care provider.  Control your hypertension. ? To reduce your risk of stroke, try to keep your blood pressure below 130/80. ? Eating a healthy diet and exercising regularly are an important part of controlling your blood pressure. If your blood pressure cannot be managed through diet and exercise, you may need to take medicines. ? Take any prescribed medicines to control hypertension as told by your health care provider. ? Ask your health care provider if you should monitor your blood pressure at home. ? Have your blood pressure checked every year, even if your blood pressure is normal. Blood pressure increases with age and some medical conditions.  Get evaluated for sleep disorders (sleep apnea). Talk to your health care provider about getting a sleep evaluation if you snore a lot or have excessive sleepiness.  Take over-the-counter and prescription medicines only as told by your health care provider. Aspirin or blood thinners (antiplatelets or anticoagulants) may be recommended to reduce your risk of forming blood clots that can lead to stroke.  Make sure that any other medical conditions you have, such as atrial fibrillation or atherosclerosis, are managed. What are the warning signs of a stroke? The warning signs of a stroke can be easily remembered as BEFAST.  B is for balance. Signs include: ? Dizziness. ? Loss of balance or coordination. ? Sudden trouble walking.  E is for eyes. Signs include: ? A sudden change in vision. ? Trouble seeing.  F is for face. Signs include: ? Sudden weakness or numbness of the face. ? The face or eyelid drooping to one side.  A is for arms. Signs include: ? Sudden weakness or numbness of the arm, usually on one side of the body.  S is for speech. Signs include: ? Trouble speaking (aphasia). ? Trouble understanding.  T is for time. ? These symptoms may represent a serious problem that is an emergency. Do not  wait to see if the symptoms will go away. Get medical help right away. Call your local emergency services (911 in the [place] Do not drive yourself to the hospital.  Other signs of stroke may include: ? A sudden, severe headache with no known cause. ? Nausea or vomiting. ? Seizure. Where to find more information For more information, visit:  American Stroke Association: www.strokeassociation.org  National Stroke Association: www.stroke.org Summary  You can prevent a stroke by eating healthy, exercising, not smoking, limiting alcohol intake, and managing any medical conditions you may have.  Do not use any products that contain nicotine or tobacco, such as cigarettes and e-cigarettes. If you need help quitting, ask your health care provider. It may also be helpful to avoid exposure to secondhand smoke.  Remember BEFAST for warning signs of stroke. Get help right away if you or a loved one has any of these signs. This information is not intended to replace advice given to you by your health care provider. Make sure you discuss any questions you have with your health care provider. Document Released: 03/26/2004 Document Revised: 03/24/2016 Document Reviewed: 03/24/2016 Elsevier Interactive Patient Education  2019 [employer].

## 2018-03-16 ENCOUNTER — Encounter: Payer: Self-pay | Admitting: Podiatry

## 2018-03-16 ENCOUNTER — Ambulatory Visit: Payer: Medicare Other | Admitting: Podiatry

## 2018-03-16 VITALS — BP 133/79

## 2018-03-16 DIAGNOSIS — Z9229 Personal history of other drug therapy: Secondary | ICD-10-CM | POA: Diagnosis not present

## 2018-03-16 DIAGNOSIS — M79675 Pain in left toe(s): Secondary | ICD-10-CM

## 2018-03-16 DIAGNOSIS — L84 Corns and callosities: Secondary | ICD-10-CM | POA: Diagnosis not present

## 2018-03-16 DIAGNOSIS — I739 Peripheral vascular disease, unspecified: Secondary | ICD-10-CM | POA: Diagnosis not present

## 2018-03-16 DIAGNOSIS — M79674 Pain in right toe(s): Secondary | ICD-10-CM

## 2018-03-16 DIAGNOSIS — B351 Tinea unguium: Secondary | ICD-10-CM

## 2018-03-16 DIAGNOSIS — D689 Coagulation defect, unspecified: Secondary | ICD-10-CM

## 2018-03-16 NOTE — Patient Instructions (Signed)
Onychomycosis/Fungal Toenails  WHAT IS IT? An infection that lies within the keratin of your nail plate that is caused by a fungus.  WHY ME? Fungal infections affect all ages, sexes, races, and creeds.  There may be many factors that predispose you to a fungal infection such as age, coexisting medical conditions such as diabetes, or an autoimmune disease; stress, medications, fatigue, genetics, etc.  Bottom line: fungus thrives in a warm, moist environment and your shoes offer such a location.  IS IT CONTAGIOUS? Theoretically, yes.  You do not want to share shoes, nail clippers or files with someone who has fungal toenails.  Walking around barefoot in the same room or sleeping in the same bed is unlikely to transfer the organism.  It is important to realize, however, that fungus can spread easily from one nail to the next on the same foot.  HOW DO WE TREAT THIS?  There are several ways to treat this condition.  Treatment may depend on many factors such as age, medications, pregnancy, liver and kidney conditions, etc.  It is best to ask your doctor which options are available to you.  1. No treatment.   Unlike many other medical concerns, you can live with this condition.  However for many people this can be a painful condition and may lead to ingrown toenails or a bacterial infection.  It is recommended that you keep the nails cut short to help reduce the amount of fungal nail. 2. Topical treatment.  These range from herbal remedies to prescription strength nail lacquers.  About 40-50% effective, topicals require twice daily application for approximately 9 to 12 months or until an entirely new nail has grown out.  The most effective topicals are medical grade medications available through physicians offices. 3. Oral antifungal medications.  With an 80-90% cure rate, the most common oral medication requires 3 to 4 months of therapy and stays in your system for a year as the new nail grows out.  Oral  antifungal medications do require blood work to make sure it is a safe drug for you.  A liver function panel will be performed prior to starting the medication and after the first month of treatment.  It is important to have the blood work performed to avoid any harmful side effects.  In general, this medication safe but blood work is required. 4. Laser Therapy.  This treatment is performed by applying a specialized laser to the affected nail plate.  This therapy is noninvasive, fast, and non-painful.  It is not covered by insurance and is therefore, out of pocket.  The results have been very good with a 80-95% cure rate.  [facility] is the only practice in the area to offer this therapy. 5. Permanent Nail Avulsion.  Removing the entire nail so that a new nail will not grow back.  Corns and Calluses Corns are small areas of thickened skin that occur on the top, sides, or tip of a toe. They contain a cone-shaped core with a point that can press on a nerve below. This causes pain.  Calluses are areas of thickened skin that can occur anywhere on the body, including the hands, fingers, palms, soles of the feet, and heels. Calluses are usually larger than corns. What are the causes? Corns and calluses are caused by rubbing (friction) or pressure, such as from shoes that are too tight or do not fit properly. What increases the risk? Corns are more likely to develop in people   who have misshapen toes (toe deformities), such as hammer toes. Calluses can occur with friction to any area of the skin. They are more likely to develop in people who:  Work with their hands.  Wear shoes that fit poorly, are too tight, or are high-heeled.  Have toe deformities. What are the signs or symptoms? Symptoms of a corn or callus include:  A hard growth on the skin.  Pain or tenderness under the skin.  Redness and swelling.  Increased discomfort while wearing tight-fitting shoes, if your feet are  affected. If a corn or callus becomes infected, symptoms may include:  Redness and swelling that gets worse.  Pain.  Fluid, blood, or pus draining from the corn or callus. How is this diagnosed? Corns and calluses may be diagnosed based on your symptoms, your medical history, and a physical exam. How is this treated? Treatment for corns and calluses may include:  Removing the cause of the friction or pressure. This may involve: ? Changing your shoes. ? Wearing shoe inserts (orthotics) or other protective layers in your shoes, such as a corn pad. ? Wearing gloves.  Applying medicine to the skin (topical medicine) to help soften skin in the hardened, thickened areas.  Removing layers of dead skin with a file to reduce the size of the corn or callus.  Removing the corn or callus with a scalpel or laser.  Taking antibiotic medicines, if your corn or callus is infected.  Having surgery, if a toe deformity is the cause. Follow these instructions at home:   Take over-the-counter and prescription medicines only as told by your health care provider.  If you were prescribed an antibiotic, take it as told by your health care provider. Do not stop taking it even if your condition starts to improve.  Wear shoes that fit well. Avoid wearing high-heeled shoes and shoes that are too tight or too loose.  Wear any padding, protective layers, gloves, or orthotics as told by your health care provider.  Soak your hands or feet and then use a file or pumice stone to soften your corn or callus. Do this as told by your health care provider.  Check your corn or callus every day for symptoms of infection. Contact a health care provider if you:  Notice that your symptoms do not improve with treatment.  Have redness or swelling that gets worse.  Notice that your corn or callus becomes painful.  Have fluid, blood, or pus coming from your corn or callus.  Have new symptoms. Summary  Corns are  small areas of thickened skin that occur on the top, sides, or tip of a toe.  Calluses are areas of thickened skin that can occur anywhere on the body, including the hands, fingers, palms, and soles of the feet. Calluses are usually larger than corns.  Corns and calluses are caused by rubbing (friction) or pressure, such as from shoes that are too tight or do not fit properly.  Treatment may include wearing any padding, protective layers, gloves, or orthotics as told by your health care provider. This information is not intended to replace advice given to you by your health care provider. Make sure you discuss any questions you have with your health care provider. Document Released: 11/23/2003 Document Revised: 12/30/2016 Document Reviewed: 12/30/2016 Elsevier Interactive Patient Education  2019 [employer].

## 2018-03-18 ENCOUNTER — Other Ambulatory Visit: Payer: Self-pay | Admitting: Internal Medicine

## 2018-03-18 ENCOUNTER — Ambulatory Visit: Payer: Medicare Other | Admitting: Vascular Surgery

## 2018-03-18 ENCOUNTER — Encounter: Payer: Self-pay | Admitting: Vascular Surgery

## 2018-03-18 ENCOUNTER — Other Ambulatory Visit: Payer: Self-pay

## 2018-03-18 VITALS — BP 134/85 | HR 73 | Temp 97.3°F | Resp 20 | Ht 74.0 in | Wt 270.0 lb

## 2018-03-18 DIAGNOSIS — I739 Peripheral vascular disease, unspecified: Secondary | ICD-10-CM | POA: Diagnosis not present

## 2018-03-18 NOTE — Progress Notes (Signed)
Patient ID: LEDARIUS LEESON, male   DOB: 05/05/1940, 78 y.o.   MRN: [id number]  Reason for Consult: New Patient (Initial Visit) (carotid stenosis)   Referred by Rogers Blocker, MD  Subjective:     HPI:  ROMNEY COMPEAN is a 78 y.o. male recently had a left-sided stroke was thought to be secondary to arrhythmia now on blood thinners.  He was also found to have calcification of his carotid bifurcation.  He does have swelling of his left greater than right lower extremity has a previous injury to his left leg.  Residual from his stroke is left upper arm diminished strength and mobility for which she has completed physical therapy.  He does have shoulder pain given the way that he carries his arm.  No tissue loss or ulceration at this time.  Past Medical History:  Diagnosis Date  . CAD (coronary artery disease)    s/p cath in 2005 and CABG x4  . Cataract   . DJD (degenerative joint disease)   . DJD (degenerative joint disease)   . Fluid retention    mild  . H/O: gout   . History of blood clots   . HTN (hypertension)   . Hyperlipidemia   . Hyperlipidemia   . Myocardial infarction ([place]) 2009  . Obesity    with reduction  . OSA (obstructive sleep apnea)    AHI 98/hr  . Renal insufficiency   . Sleep apnea   . Stroke [facility]) 2000   Family History  Problem Relation Age of Onset  . Gout Mother   . Stroke Mother   . Coronary artery disease Mother   . Hypertension Mother   . Arthritis Mother   . Coronary artery disease Father   . Arthritis Father   . Gout Father   . Stroke Father   . Hypertension Father   . Coronary artery disease Brother   . Arthritis Brother   . Gout Brother   . Stroke Brother   . Hypertension Brother   . Coronary artery disease Brother   . Arthritis Brother   . Gout Brother   . Stroke Brother   . Hypertension Brother   . Coronary artery disease Other        significant in multiple family members   Past Surgical History:  Procedure Laterality Date  .  CORONARY ARTERY BYPASS GRAFT  2005   LIMA to LAD, SVG to OM1 and OM 2, RCA.   [name] left inguinal hernia repair    . LOOP RECORDER INSERTION N/A 01/03/2018   Procedure: LOOP RECORDER INSERTION;  Surgeon: Evans Lance, MD;  Location: [place] CV LAB;  Service: Cardiovascular;  Laterality: N/A;  . TEE WITHOUT CARDIOVERSION N/A 12/11/2016   Procedure: TRANSESOPHAGEAL ECHOCARDIOGRAM (TEE);  Surgeon: [name], MD;  Location: [facility] ENDOSCOPY;  Service: Cardiovascular;  Laterality: N/A;  . TEE WITHOUT CARDIOVERSION N/A 01/03/2018   Procedure: TRANSESOPHAGEAL ECHOCARDIOGRAM (TEE);  Surgeon: Dorothy Spark, MD;  Location: [facility] ENDOSCOPY;  Service: Cardiovascular;  Laterality: N/A;    Short Social History:  Social History   Tobacco Use  . Smoking status: Former Smoker    Types: Cigars  . Smokeless tobacco: Never Used  . Tobacco comment: used to smoke cigars, 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, none since  Substance Use Topics  . Alcohol use: No    Allergies  Allergen Reactions  . Ramipril Cough    Current Outpatient Medications  Medication Sig Dispense Refill  .  acetaminophen (TYLENOL) 325 MG tablet Take 1-2 tablets (325-650 mg total) by mouth every 4 (four) hours as needed for mild pain.    [name] allopurinol (ZYLOPRIM) 100 MG tablet Take 200 mg by mouth daily.     [name] amLODipine (NORVASC) 5 MG tablet TAKE ONE TABLET BY MOUTH ONCE DAILY. 30 tablet 0  . apixaban (ELIQUIS) 5 MG TABS tablet Take 1 tablet (5 mg total) by mouth 2 (two) times daily. 60 tablet 6  . furosemide (LASIX) 20 MG tablet Take 0.5 tablets (10 mg total) by mouth daily. 30 tablet 0  . Menthol-Methyl Salicylate (MUSCLE RUB) 10-15 % CREA Apply 1 application topically 4 (four) times daily -  with meals and at bedtime. To neck and shoulders  0  . metoprolol tartrate (LOPRESSOR) 25 MG tablet TAKE ONE TABLET BY MOUTH TWICE DAILY (Patient taking differently: Take 25 mg by mouth 2 (two) times daily. ) 30 tablet 0  . montelukast  (SINGULAIR) 10 MG tablet Take 10 mg by mouth at bedtime.    . niacin (NIASPAN) 500 MG CR tablet Take 500 mg by mouth at bedtime.      . pantoprazole (PROTONIX) 40 MG tablet Take 1 tablet (40 mg total) by mouth daily. 30 tablet 0  . polyethylene glycol (MIRALAX / GLYCOLAX) packet Take 17 g by mouth daily. 60 each 0  . rosuvastatin (CRESTOR) 20 MG tablet Take 1 tablet (20 mg total) by mouth daily at 6 PM.    . tamsulosin (FLOMAX) 0.4 MG CAPS capsule Take 1 capsule (0.4 mg total) by mouth daily. 30 capsule    No current facility-administered medications for this visit.     Review of Systems  Constitutional:  Constitutional negative. HENT: HENT negative.  Eyes: Eyes negative.  Respiratory: Respiratory negative.  Cardiovascular: Cardiovascular negative.  GI: Gastrointestinal negative.  Musculoskeletal: Musculoskeletal negative.  Skin: Skin negative.  Neurological: Positive for focal weakness and speech difficulty.  Hematologic: Hematologic/lymphatic negative.  Psychiatric: Psychiatric negative.        Objective:  Objective   Vitals:   03/18/18 1245 03/18/18 1249  BP: 121/84 134/85  Pulse: 73   Resp: 20   Temp: (!) 97.3 F (36.3 C)   SpO2: 96%   Weight: 270 lb (122.5 kg)   Height: 6\' 2"  (1.88 m)    Body mass index is 34.67 kg/m.  Physical Exam HENT:     Head: Normocephalic.  Eyes:     Pupils: Pupils are equal, round, and reactive to light.  Neck:     Musculoskeletal: Normal range of motion and neck supple.     Vascular: No carotid bruit.  Cardiovascular:     Pulses:          Radial pulses are 2+ on the right side and 2+ on the left side.       Popliteal pulses are 2+ on the right side and 2+ on the left side.  Abdominal:     General: Abdomen is flat.     Palpations: Abdomen is soft.  Musculoskeletal:        General: Swelling and deformity present.  Skin:    General: Skin is warm and dry.     Capillary Refill: Capillary refill takes less than 2 seconds.    Neurological:     Mental Status: He is alert.     Comments: Limited mobility left upper extremity  Psychiatric:        Mood and Affect: Mood normal.  Thought Content: Thought content normal.        Judgment: Judgment normal.     Data: I reviewed his CT angios with him which demonstrates only minimal calcification of left internal carotid artery bifurcation and on the right side which is the site of concern there is some soft plaque but does not add up to any significant stenosis by NASCET criteria     Assessment/Plan:     78 year old male had a right-sided penumbra stroke here for evaluation of carotid disease.  He also does not have palpable pedal pulses but chief complaint there is just swelling of the left leg.  Stroke was likely secondary to arrhythmia now on Eliquis.  We discussed signs symptoms of stroke which he is very familiar with.  We will follow-up with carotid duplex in 1 year with ABIs as well.     Waynetta Sandy MD [facility]

## 2018-03-20 LAB — CUP PACEART REMOTE DEVICE CHECK
Date Time Interrogation Session: 20191207203910
MDC IDC PG IMPLANT DT: 20191104

## 2018-03-21 ENCOUNTER — Ambulatory Visit: Payer: Medicare Other | Admitting: Internal Medicine

## 2018-03-21 ENCOUNTER — Encounter: Payer: Self-pay | Admitting: Internal Medicine

## 2018-03-21 VITALS — BP 158/100 | HR 61 | Ht 72.0 in | Wt 267.0 lb

## 2018-03-21 DIAGNOSIS — I2510 Atherosclerotic heart disease of native coronary artery without angina pectoris: Secondary | ICD-10-CM

## 2018-03-21 DIAGNOSIS — Z8673 Personal history of transient ischemic attack (TIA), and cerebral infarction without residual deficits: Secondary | ICD-10-CM

## 2018-03-21 DIAGNOSIS — I4892 Unspecified atrial flutter: Secondary | ICD-10-CM

## 2018-03-21 DIAGNOSIS — I10 Essential (primary) hypertension: Secondary | ICD-10-CM

## 2018-03-21 DIAGNOSIS — E7800 Pure hypercholesterolemia, unspecified: Secondary | ICD-10-CM | POA: Diagnosis not present

## 2018-03-21 NOTE — Progress Notes (Signed)
OFFICE NOTE  Chief Complaint:  Follow-up stroke  Primary Care Physician: Rogers Blocker, MD  HPI:  [name] is a 78 y.o. male with a past medial history significant for coronary artery disease status post CABG in 2005 (LIMA to LAD, SVG to first OM and SVG to second OM and SVG to RCA (, hypertension, dyslipidemia and gout.  He was last seen in July by Leonia Reader, DNP, which time he had a stress test which was negative for ischemia.  I had met him in October 2018 at the time when he was hospitalized for bacteremia and had a TEE then which was negative for vegetation.  Recently he unfortunately had a stroke in November and had a repeat TEE which was unremarkable.  Subsequently he had an implanted loop recorder placed by Dr. Lovena Le.  This did demonstrate 2-1 atrial flutter with 10 episodes in January and he ultimately has been placed on Eliquis.  He seems to be tolerating this.  He was unaware of any A. fib.  He continues in neuro rehab.  PMHx:  Past Medical History:  Diagnosis Date  . CAD (coronary artery disease)    s/p cath in 2005 and CABG x4  . Cataract   . DJD (degenerative joint disease)   . DJD (degenerative joint disease)   . Fluid retention    mild  . H/O: gout   . History of blood clots   . HTN (hypertension)   . Hyperlipidemia   . Hyperlipidemia   . Myocardial infarction ([place]) 2009  . Obesity    with reduction  . OSA (obstructive sleep apnea)    AHI 98/hr  . Renal insufficiency   . Sleep apnea   . Stroke [facility]) 2000    Past Surgical History:  Procedure Laterality Date  . CORONARY ARTERY BYPASS GRAFT  2005   LIMA to LAD, SVG to OM1 and OM 2, RCA.   [name] left inguinal hernia repair    . LOOP RECORDER INSERTION N/A 01/03/2018   Procedure: LOOP RECORDER INSERTION;  Surgeon: Evans Lance, MD;  Location: [place] CV LAB;  Service: Cardiovascular;  Laterality: N/A;  . TEE WITHOUT CARDIOVERSION N/A 12/11/2016   Procedure: TRANSESOPHAGEAL ECHOCARDIOGRAM (TEE);   Surgeon: [name], MD;  Location: [facility] ENDOSCOPY;  Service: Cardiovascular;  Laterality: N/A;  . TEE WITHOUT CARDIOVERSION N/A 01/03/2018   Procedure: TRANSESOPHAGEAL ECHOCARDIOGRAM (TEE);  Surgeon: Dorothy Spark, MD;  Location: [facility] ENDOSCOPY;  Service: Cardiovascular;  Laterality: N/A;    FAMHx:  Family History  Problem Relation Age of Onset  . Gout Mother   . Stroke Mother   . Coronary artery disease Mother   . Hypertension Mother   . Arthritis Mother   . Coronary artery disease Father   . Arthritis Father   . Gout Father   . Stroke Father   . Hypertension Father   . Coronary artery disease Brother   . Arthritis Brother   . Gout Brother   . Stroke Brother   . Hypertension Brother   . Coronary artery disease Brother   . Arthritis Brother   . Gout Brother   . Stroke Brother   . Hypertension Brother   . Coronary artery disease Other        significant in multiple family members    SOCHx:   reports that he has quit smoking. His smoking use included cigars. He has never used smokeless tobacco. He reports that he does not drink alcohol or  use drugs.  ALLERGIES:  Allergies  Allergen Reactions  . Ramipril Cough    ROS: Pertinent items noted in HPI and remainder of comprehensive ROS otherwise negative.  HOME MEDS: Current Outpatient Medications on File Prior to Visit  Medication Sig Dispense Refill  . acetaminophen (TYLENOL) 325 MG tablet Take 1-2 tablets (325-650 mg total) by mouth every 4 (four) hours as needed for mild pain.    [name] allopurinol (ZYLOPRIM) 100 MG tablet Take 200 mg by mouth daily.     [name] amLODipine (NORVASC) 5 MG tablet TAKE ONE TABLET BY MOUTH ONCE DAILY. 30 tablet 0  . apixaban (ELIQUIS) 5 MG TABS tablet Take 1 tablet (5 mg total) by mouth 2 (two) times daily. 60 tablet 6  . furosemide (LASIX) 20 MG tablet Take 0.5 tablets (10 mg total) by mouth daily. 30 tablet 0  . Menthol-Methyl Salicylate (MUSCLE RUB) 10-15 % CREA Apply 1 application  topically 4 (four) times daily -  with meals and at bedtime. To neck and shoulders  0  . metoprolol tartrate (LOPRESSOR) 25 MG tablet TAKE ONE TABLET BY MOUTH TWICE DAILY (Patient taking differently: Take 25 mg by mouth 2 (two) times daily. ) 30 tablet 0  . montelukast (SINGULAIR) 10 MG tablet Take 10 mg by mouth at bedtime.    . niacin (NIASPAN) 500 MG CR tablet Take 500 mg by mouth at bedtime.      . pantoprazole (PROTONIX) 40 MG tablet Take 1 tablet (40 mg total) by mouth daily. 30 tablet 0  . polyethylene glycol (MIRALAX / GLYCOLAX) packet Take 17 g by mouth daily. 60 each 0  . rosuvastatin (CRESTOR) 20 MG tablet Take 1 tablet (20 mg total) by mouth daily at 6 PM.    . tamsulosin (FLOMAX) 0.4 MG CAPS capsule Take 1 capsule (0.4 mg total) by mouth daily. 30 capsule    No current facility-administered medications on file prior to visit.     LABS/IMAGING: No results found for this or any previous visit (from the past 48 hour(s)). No results found.  LIPID PANEL:    Component Value Date/Time   CHOL 127 01/01/2018 0308   CHOL 186 09/07/2017 1617   TRIG 73 01/01/2018 0308   HDL 39 (L) 01/01/2018 0308   HDL 39 (L) 09/07/2017 1617   CHOLHDL 3.3 01/01/2018 0308   VLDL 15 01/01/2018 0308   LDLCALC 73 01/01/2018 0308   LDLCALC 126 (H) 09/07/2017 1617     WEIGHTS: Wt Readings from Last 3 Encounters:  03/21/18 267 lb (121.1 kg)  03/18/18 270 lb (122.5 kg)  03/15/18 270 lb 12.8 oz (122.8 kg)    VITALS: BP (!) 158/100   Pulse 61   Ht 6' (1.829 m)   Wt 267 lb (121.1 kg)   BMI 36.21 kg/m   EXAM: General appearance: alert and no distress Neck: no carotid bruit, no JVD and thyroid not enlarged, symmetric, no tenderness/mass/nodules Lungs: clear to auscultation bilaterally Heart: regular rate and rhythm Abdomen: soft, non-tender; bowel sounds normal; no masses,  no organomegaly Extremities: extremities normal, atraumatic, no cyanosis or edema Pulses: 2+ and symmetric Skin: Skin  color, texture, turgor normal. No rashes or lesions Neurologic: Mental status: Alert, oriented, thought content appropriate, 4 out of 5 left arm strength Psych: Pleasant  EKG: Deferred  ASSESSMENT: 1. CAD status post CABG x4 in 2005 (LIMA to LAD, SVG to OM1 and OM 2, SVG to RCA), low risk Myoview stress test with normal LV function (08/2017) - LVEF 52%  2. Hypertension 3. Dyslipidemia 4. Stroke (12/2017) - s/p ILR 5. Paroxysmal atrial flutter-anticoagulated on Eliquis - CHADVASC score of 5  PLAN: 1.   [name] is now anticoagulated on Eliquis with findings of 2-1 atrial flutter on his implanted loop recorder.  This is the likely cause of his stroke.  Blood pressure is elevated today and I have encouraged him to purchase a blood pressure cuff and monitor at home.  It did come down to 140/90.  He does have a history of coronary disease but had a low risk Myoview stress test in July 2019 with EF 52%.  His bypass grafts are now 76 years old.  Lipid profile in November showed total cholesterol 127, triglycerides 73, HDL 39 and LDL of 73, improved from an LDL of 126 approximately 6 months ago.  Overall it seems to be well treated at this point.  I will continue to follow and plan to see him back in 6 months.  I do not see the need for establishing care in the A. fib clinic as I can easily follow-up for this.  Follow-up 6 months.  [name], MD, [facility], [place] Director of the Advanced Lipid Disorders &  Cardiovascular Risk Reduction Clinic Diplomate of the American Board of Clinical Lipidology Attending Cardiologist  Direct Dial: [phone]  Fax: [phone]  Website:  [email or web address] 03/21/2018, 11:48 AM

## 2018-03-21 NOTE — Patient Instructions (Signed)
Medication Instructions:  Continue current medications  If you need a refill on your cardiac medications before your next appointment, please call your pharmacy.  Labwork: None Ordered  Take the provided lab slips with you to the lab for your blood draw.  When you have your labs (blood work) drawn today and your tests are completely normal, you will receive your results only by MyChart Message (if you have MyChart) -OR-  A paper copy in the mail.  If you have any lab test that is abnormal or we need to change your treatment, we will call you to review these results.  Testing/Procedures: None Ordered  Special Instructions: Continue to monitor blood pressure  Follow-Up: You will need a follow up appointment in 6 months.  Please call our office 2 months in advance to schedule this appointment.  You may see Dr  or one of the following Advanced Practice Providers on your designated Care Team: Almyra Deforest, [place] . Fabian Sharp, PA-C    At [facility], you and your health needs are our priority.  As part of our continuing mission to provide you with exceptional heart care, we have created designated Provider Care Teams.  These Care Teams include your primary Cardiologist (physician) and Advanced Practice Providers (APPs -  Physician Assistants and Nurse Practitioners) who all work together to provide you with the care you need, when you need it.  Thank you for choosing [facility] at [facility]!!

## 2018-03-22 ENCOUNTER — Other Ambulatory Visit (HOSPITAL_COMMUNITY): Payer: Self-pay | Admitting: Physical Medicine and Rehabilitation

## 2018-03-31 ENCOUNTER — Other Ambulatory Visit: Payer: Self-pay | Admitting: Internal Medicine

## 2018-04-03 NOTE — Progress Notes (Signed)
Subjective:  Patient presents to clinic with cc of  painful calluses and corns both feet. Pain is aggravated when weightbearing with and without shoe gear.  This pain limits his daily activities. Pain symptoms resolve with periodic professional debridement.  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 He had his stroke on January 05, 2018. He remains on Eliquis.  Rogers Blocker, MD is his PCP.   Current Outpatient Medications:  .  acetaminophen (TYLENOL) 325 MG tablet, Take 1-2 tablets (325-650 mg total) by mouth every 4 (four) hours as needed for mild pain., Disp: , Rfl:  .  allopurinol (ZYLOPRIM) 100 MG tablet, Take 200 mg by mouth daily. , Disp: , Rfl:  .  amLODipine (NORVASC) 5 MG tablet, TAKE ONE TABLET BY MOUTH ONCE DAILY., Disp: 30 tablet, Rfl: 0 .  apixaban (ELIQUIS) 5 MG TABS tablet, Take 1 tablet (5 mg total) by mouth 2 (two) times daily., Disp: 60 tablet, Rfl: 6 .  Menthol-Methyl Salicylate (MUSCLE RUB) 10-15 % CREA, Apply 1 application topically 4 (four) times daily -  with meals and at bedtime. To neck and shoulders, Disp: , Rfl: 0 .  metoprolol tartrate (LOPRESSOR) 25 MG tablet, TAKE ONE TABLET BY MOUTH TWICE DAILY (Patient taking differently: Take 25 mg by mouth 2 (two) times daily. ), Disp: 30 tablet, Rfl: 0 .  montelukast (SINGULAIR) 10 MG tablet, Take 10 mg by mouth at bedtime., Disp: , Rfl:  .  niacin (NIASPAN) 500 MG CR tablet, Take 500 mg by mouth at bedtime.  , Disp: , Rfl:  .  pantoprazole (PROTONIX) 40 MG tablet, Take 1 tablet (40 mg total) by mouth daily., Disp: 30 tablet, Rfl: 0 .  polyethylene glycol (MIRALAX / GLYCOLAX) packet, Take 17 g by mouth daily., Disp: 60 each, Rfl: 0 .  rosuvastatin (CRESTOR) 20 MG tablet, Take 1 tablet (20 mg total) by mouth daily at 6 PM., Disp: , Rfl:  .  tamsulosin (FLOMAX) 0.4 MG CAPS capsule, Take 1 capsule (0.4 mg total) by mouth daily., Disp: 30 capsule, Rfl:  .  furosemide (LASIX) 20 MG tablet, TAKE 1/2 (ONE-HALF) TABLET BY  MOUTH ONCE DAILY, Disp: 30 tablet, Rfl: 0   Allergies  Allergen Reactions  . Ramipril Cough     Objective:  Physical Examination: 78 yo AAM, obese, in NAD. AAO x 3.   Vascular Examination: Capillary refill time immediate x 10 digits DP pulses 2/4 b/l Posterior tibial pulses nonpalpable b/l Skin temperature gradient WNL b/l Trace edema left LE  Dermatological Examination: Skin with normal turgor, texture and tone b/l  Dependent rubor b/l  Hyperkeratotic lesion b/l great toes and distal tip right 3rd digit  Toenails 1-5 b/l are painful, elongated, discolored, dystrophic with subungual debris. Pain with dorsal palpation of nailplates. No erythema, no edema, no drainage noted.  Pressure noted dorsal aspect right 2nd/3rd digits from shoegear. Blanchable erythema  Musculoskeletal Examination: Muscle strength 5/5 to all muscle groups RLE  Weakness LLE secondary to CVA  Assessment: Painful onychomycosis 1-5 b/l Corns and calluses b/l hallux and distal tip right 3rd digit PAD Coagulopathy  Plan: Toenails 1-5 b/l debrided in length and girth without incident. Calluses/corn pared b/l hallux and distal tip right 3rd digit Advised Mr. Rexrode to avoid shoes such as dress shoes/leather shoes which apply pressure to toes. Continue soft, supportive shoe gear daily. Report any pedal injuries to medical professional. Follow up 3 months. Call our office should there be any questions/concerns in the interim.

## 2018-04-05 ENCOUNTER — Encounter: Payer: Self-pay | Admitting: Occupational Therapy

## 2018-04-05 ENCOUNTER — Ambulatory Visit: Payer: Medicare Other | Attending: Physical Medicine & Rehabilitation | Admitting: Rehabilitation

## 2018-04-05 ENCOUNTER — Ambulatory Visit: Payer: Medicare Other | Admitting: Occupational Therapy

## 2018-04-05 ENCOUNTER — Other Ambulatory Visit: Payer: Self-pay

## 2018-04-05 ENCOUNTER — Encounter: Payer: Self-pay | Admitting: Rehabilitation

## 2018-04-05 DIAGNOSIS — M25642 Stiffness of left hand, not elsewhere classified: Secondary | ICD-10-CM | POA: Diagnosis present

## 2018-04-05 DIAGNOSIS — M25612 Stiffness of left shoulder, not elsewhere classified: Secondary | ICD-10-CM | POA: Insufficient documentation

## 2018-04-05 DIAGNOSIS — R2681 Unsteadiness on feet: Secondary | ICD-10-CM

## 2018-04-05 DIAGNOSIS — R2689 Other abnormalities of gait and mobility: Secondary | ICD-10-CM

## 2018-04-05 DIAGNOSIS — I69354 Hemiplegia and hemiparesis following cerebral infarction affecting left non-dominant side: Secondary | ICD-10-CM

## 2018-04-05 DIAGNOSIS — R278 Other lack of coordination: Secondary | ICD-10-CM | POA: Diagnosis present

## 2018-04-05 DIAGNOSIS — M25512 Pain in left shoulder: Secondary | ICD-10-CM

## 2018-04-05 NOTE — Therapy (Signed)
[place] [place] [address] [place], [place], [address] Phone: [phone]   Fax:  [phone]  Physical Therapy Evaluation  Patient Details  Name: [name] MRN: [id number] Date of Birth: [birth year] Referring Provider (PT): Alysia Penna, MD   Encounter Date: 04/05/2018  PT End of Session - 04/05/18 1953    Visit Number  1    Number of Visits  17    Date for PT Re-Evaluation  07/04/18   POC written for 90 days, plan to see for 60 days   Authorization Type  Medicare-Needs 10th visit progress note    PT Start Time  1151    PT Stop Time  1230    PT Time Calculation (min)  39 min    Activity Tolerance  Patient tolerated treatment well    Behavior During Therapy  [facility] for tasks assessed/performed       Past Medical History:  Diagnosis Date  . CAD (coronary artery disease)    s/p cath in 2005 and CABG x4  . Cataract   . DJD (degenerative joint disease)   . DJD (degenerative joint disease)   . Fluid retention    mild  . H/O: gout   . History of blood clots   . HTN (hypertension)   . Hyperlipidemia   . Hyperlipidemia   . Myocardial infarction ([place]) 2009  . Obesity    with reduction  . OSA (obstructive sleep apnea)    AHI 98/hr  . Renal insufficiency   . Sleep apnea   . Stroke [facility]) 2000    Past Surgical History:  Procedure Laterality Date  . CORONARY ARTERY BYPASS GRAFT  2005   LIMA to LAD, SVG to OM1 and OM 2, RCA.   [name] left inguinal hernia repair    . LOOP RECORDER INSERTION N/A 01/03/2018   Procedure: LOOP RECORDER INSERTION;  Surgeon: Evans Lance, MD;  Location: [place] CV LAB;  Service: Cardiovascular;  Laterality: N/A;  . TEE WITHOUT CARDIOVERSION N/A 12/11/2016   Procedure: TRANSESOPHAGEAL ECHOCARDIOGRAM (TEE);  Surgeon: [name], MD;  Location: [facility] ENDOSCOPY;  Service: Cardiovascular;  Laterality: N/A;  . TEE WITHOUT CARDIOVERSION N/A 01/03/2018   Procedure: TRANSESOPHAGEAL  ECHOCARDIOGRAM (TEE);  Surgeon: Dorothy Spark, MD;  Location: [facility] ENDOSCOPY;  Service: Cardiovascular;  Laterality: N/A;    There were no vitals filed for this visit.   Subjective Assessment - 04/05/18 1155    Subjective  "I really want to be able to make a fist." Pt initially reporting that legs are "totally fine" as well as balance, however wife reports he does tend to drag LLE at times.     Patient is accompained by:  Family member   [employer]   Pertinent History  CAD s/p CABG 2005, HTN, HLD, OSA, DVTs    Limitations  Walking;House hold activities    Patient Stated Goals  "I would like to be able to walk outside better."     Currently in Pain?  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   No    Is the patient reluctant to leave their home because of a fear of falling?   No      Home Environment   Living Environment  Private residence    Living Arrangements  Spouse/significant other    Available Help at Discharge  Family;Available 24 hours/day    Type of Home  House    Home Access  Stairs to enter    Entrance Stairs-Number of Steps  7    Entrance Stairs-Rails  Right;Left;Can reach both    Home Layout  Two level;Bed/bath upstairs    Alternate Level Stairs-Number of Steps  4 then 6     Alternate Level Stairs-Rails  Right;Left    Home Equipment  Walker - 2 wheels;Grab bars - tub/shower;Shower seat   walk in shower      Prior Function   Level of Independence  Independent    Vocation   Retired    Leisure  enjoys Armed forces operational officer   Overall Cognitive Status  Within Functional Limits for tasks assessed      Sensation   Light Touch  Appears Intact    Hot/Cold  Appears Intact    Proprioception  Appears Intact      Coordination   Gross Motor Movements are Fluid and Coordinated  Yes   in LEs   Fine Motor Movements are Fluid and Coordinated  Yes   in LEs   Heel Shin Test  [facility]      Posture/Postural Control   Posture/Postural Control  Postural limitations    Postural Limitations  Forward head;Rounded Shoulders      ROM / Strength   AROM / PROM / Strength  Strength      Strength   Overall Strength  Deficits    Overall Strength Comments  L hip flex (seated) 3+/5, L knee flex 4/5, L knee ext 4/5.  All others grossly 5/5.  Suspect hip abd weakness due to gait deviations.       Transfers   Transfers  Sit to Stand;Stand to Sit    Sit to Stand  6: Modified independent (Device/Increase time)    Five time sit to stand comments   14.25 secs without UE support, very wide BOS    Stand to Sit  6: Modified independent (Device/Increase time)      Ambulation/Gait   Ambulation/Gait  Yes    Ambulation/Gait Assistance  5: Supervision    Ambulation/Gait Assistance Details  Note that with increased ambulation, pt demonstrates Trendelenburg gait pattern and decreased ability to clear LLE with foot slap    Ambulation Distance (Feet)  300 Feet    Assistive device  None    Gait Pattern  Step-through pattern;Decreased arm swing - left;Decreased stance time - left;Decreased stride length;Decreased dorsiflexion - left;Decreased weight shift to left;Trendelenburg;Lateral hip instability;Trunk flexed;Wide base of support;Poor foot clearance - left    Ambulation Surface  Level;Indoor    Gait velocity  3.02 ft/sec without AD    Stairs  Yes    Stairs Assistance  5: Supervision    Stair Management Technique  One rail Right;Alternating pattern;Forwards    Number of Stairs  4    Height of  Stairs  6      Functional Gait  Assessment   Gait assessed   Yes    Gait Level Surface  Walks 20 ft, slow speed, abnormal gait pattern, evidence for imbalance or deviates 10-15 in outside of the 12 in walkway width. Requires more than 7 sec to ambulate 20 ft.   7.19 ft/sec    Change in Gait Speed  Able to change speed, demonstrates mild gait deviations, deviates 6-10 in outside of the 12 in walkway width, or no gait deviations, unable to achieve a major change in velocity, or uses a change in velocity, or uses an assistive device.    Gait with Horizontal Head Turns  Performs head turns smoothly with slight change in gait velocity (eg, minor disruption to smooth gait path), deviates 6-10 in outside 12 in walkway width, or uses an assistive device.    Gait with Vertical Head Turns  Performs head turns with no change in gait. Deviates no more than 6 in outside 12 in walkway width.    Gait and Pivot Turn  Pivot turns safely in greater than 3 sec and stops with no loss of balance, or pivot turns safely within 3 sec and stops with mild imbalance, requires small steps to catch balance.    Step Over Obstacle  Is able to step over one shoe box (4.5 in total height) without changing gait speed. No evidence of imbalance.    Gait with Narrow Base of Support  Ambulates less than 4 steps heel to toe or cannot perform without assistance.    Gait with Eyes Closed  Walks 20 ft, uses assistive device, slower speed, mild gait deviations, deviates 6-10 in outside 12 in walkway width. Ambulates 20 ft in less than 9 sec but greater than 7 sec.    Ambulating Backwards  Walks 20 ft, uses assistive device, slower speed, mild gait deviations, deviates 6-10 in outside 12 in walkway width.    Steps  Alternating feet, must use rail.    Total Score  18    FGA comment:  < 19 = high risk fall                Objective measurements completed on examination: See above findings.              PT Education -  04/05/18 1952    Education Details  evaluation findings, POC, goals     Person(s) Educated  Patient;Spouse    Methods  Explanation    Comprehension  Verbalized understanding       PT Short Term Goals - 04/05/18 1959      PT SHORT TERM GOAL #1   Title  Pt will initiate HEP in order to indicate improved functional mobility and balance.  (Target Date: 05/05/2018)    Time  4    Period  Weeks    Status  New    Target Date  05/05/18      PT SHORT TERM GOAL #2   Title  Pt will improve gait speed to >/=3.60 ft/sec in order to indicate improved efficiency of gait.     Time  4    Period  Weeks    Status  New      PT SHORT TERM GOAL #3   Title  Pt will improve 5TSS to </=11 secs without UE support with decreased BOS in order to indicate decreased fall risk.     Time  4    Period  Weeks  Status  New      PT SHORT TERM GOAL #4   Title  Pt will improve FGA to >/=21/30 in order to indicate decreased fall risk.     Time  4    Period  Weeks    Status  New      PT SHORT TERM GOAL #5   Title  Pt will perform 6MWT and improve distance by 150' in order to indicate improved functional endurance.     Time  4    Period  Weeks    Status  New      Additional Short Term Goals   Additional Short Term Goals  Yes      PT SHORT TERM GOAL #6   Title  Pt will ambulate up to 500' over unlevel paved outdoor surfaces w/o AD at S level in order to indicate improved community mobility.     Time  4    Period  Weeks    Status  New        PT Long Term Goals - 04/05/18 2003      PT LONG TERM GOAL #1   Title  Pt will be independent with final HEP in order to indicate decreased fall risk and improved functional mobility.  (Target Date: 06/04/2018)    Time  8    Period  Weeks    Status  New    Target Date  06/04/18      PT LONG TERM GOAL #2   Title  Pt will improve FGA to >/=24/30 in order to indicate decreased fall risk.      Time  8    Period  Weeks    Status  New      PT LONG TERM GOAL #3    Title  Pt will improve 6MWT by 300' from baseline in order to indicate improved functional endurance.     Time  8    Period  Weeks    Status  New      PT LONG TERM GOAL #4   Title  Pt will ambulate up to 1000' over varying outdoor surfaces (grass, inclines/declines, curb/ramp) without AD at mod I level in order to indicate improved community mobility.      Time  8    Period  Weeks    Status  New      PT LONG TERM GOAL #5   Title  Pt will demonstrate ability to simulate bowling without overt LOB in order to return to leisure activity.     Time  8    Period  Weeks    Status  New             Plan - 04/05/18 1954    Clinical Impression Statement  Pt presents s/p R MCA CVA with L hemiparesis on 11/2 with IP rehab stay from 11/6-11/23 followed by HHPT/OT.  Note history of HTN, HLD, CAD s/p CABG, PAD, OSA and hx of DVTs.  Upon PT evaluation, note decreased LUE/LE strength, decreased balance and decreased ability to return to leisure activities.  Pt with gait speed of 3.02 ft/sec which is indicative of safe community ambulator, but with gait deviations and below normal gait speed, 5TSS time of 14.25 secs without UE support indicative of elevated fall risk and decreased functional strength, and FGA score of 18/30 indicative of high fall risk.  Pt will benefit form skilled OP neuro PT in order to address deficits.      History and  Personal Factors relevant to plan of care:  SEe above    Clinical Presentation  Evolving    Clinical Presentation due to:  see above    Clinical Decision Making  Moderate    Rehab Potential  Good    Clinical Impairments Affecting Rehab Potential  Pt motivated to improve mobility    PT Frequency  2x / week    PT Duration  8 weeks    PT Treatment/Interventions  ADLs/Self Care Home Management;Aquatic Therapy;Electrical Stimulation;Gait training;Stair training;DME Instruction;Functional mobility training;Therapeutic activities;Therapeutic exercise;Balance  training;Neuromuscular re-education;Patient/family education;Orthotic Fit/Training;Passive range of motion;Vestibular    PT Next Visit Plan  6MWT, give HEP for LLE strengthening (sit<>stand, hip abd and extension), improved L foot clearance, outdoor gait    Consulted and Agree with Plan of Care  Patient;Family member/caregiver    Family Member Consulted  wife Marolyn Hammock        Patient will benefit from skilled therapeutic intervention in order to improve the following deficits and impairments:  Abnormal gait, Decreased activity tolerance, Decreased balance, Decreased endurance, Decreased mobility, Decreased range of motion, Impaired perceived functional ability, Impaired flexibility, Impaired UE functional use, Postural dysfunction  Visit Diagnosis: Hemiplegia and hemiparesis following cerebral infarction affecting left non-dominant side (HCC)  Unsteadiness on feet  Other abnormalities of gait and mobility     Problem List Patient Active Problem List   Diagnosis Date Noted  . Abnormal peripheral vision, left 01/21/2018  . Anemia, chronic disease   . Left hemiparesis ([place])   . Acute ischemic cerebrovascular accident (CVA) involving right middle cerebral artery [facility])   . CKD (chronic kidney disease), stage III ([place])   . Peripheral edema   . Stroke (cerebrum) ([place]) 01/05/2018  . History of gout   . AKI (acute kidney injury) ([place])   . Stage 3 chronic kidney disease ([place])   . Anemia of chronic disease   . Left-sided weakness   . Osteoarthritis   . OSA (obstructive sleep apnea)   . Noncompliance with CPAP treatment   . History of DVT (deep vein thrombosis)   . History of CVA (cerebrovascular accident)   . Dysphagia, post-stroke   . Sinus tachycardia   . Acute blood loss anemia   . Acute CVA (cerebrovascular accident) ([place]) 01/01/2018  . Hypertensive urgency 01/01/2018  . CKD (chronic kidney disease) stage 2, GFR 60-89 ml/min 01/01/2018  . Sensorineural hearing loss (SNHL),  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  . Generalized weakness   . Sepsis ([place]) 12/10/2016  . Bacteremia due to Streptococcus 12/10/2016  . Lactic acidosis 12/10/2016  . Elevated troponin 12/10/2016  . Renal insufficiency 12/10/2016  . Hypophosphatemia 12/10/2016  . Left leg swelling 12/10/2016  . Febrile illness 12/09/2016  . Febrile illness, acute   . OBESITY, UNSPECIFIED 05/31/2009  . Hyperlipidemia 05/30/2009  . Obstructive sleep apnea 05/30/2009  . Essential hypertension 05/30/2009  . CAD (coronary artery disease) 05/30/2009    Cameron Sprang, PT, MPT [facility] [address] [place], [place], [address] Phone: [phone]   Fax:  [phone] 04/05/18, 8:07 PM  Name: [name] MRN: [id number] Date of Birth: 05-24-1940

## 2018-04-05 NOTE — Patient Instructions (Signed)
   Perform 2-3x/day  1.  Squeeze shoulder blades together (not up).  Hold 5-10 sec.  10x  2. Use right hand to bend fingers down (all joints) then try to hold in a fist.  5sec, 10x  3.  Place hands on table.  Slide arms forward across table for a stretch.  Hold 5-10sec, 10x

## 2018-04-05 NOTE — Therapy (Signed)
[facility] [address] [place], [place], [address] Phone: [phone]   Fax:  [phone]  Occupational Therapy Evaluation  Patient Details  Name: [name] MRN: [id number] Date of Birth: Aug 05, 1940 Referring Provider (OT): Dr. Alysia Penna   Encounter Date: 04/05/2018  OT End of Session - 04/05/18 1337    Visit Number  1    Number of Visits  25    Date for OT Re-Evaluation  07/04/18    Authorization Type  [employer] Medicare     Authorization - Visit Number  1    Authorization - Number of Visits  10    OT Start Time  1100    OT Stop Time  1150    OT Time Calculation (min)  50 min    Equipment Utilized During Treatment  **FOTO    Activity Tolerance  Patient tolerated treatment well    Behavior During Therapy  [facility] for tasks assessed/performed       Past Medical History:  Diagnosis Date  . CAD (coronary artery disease)    s/p cath in 2005 and CABG x4  . Cataract   . DJD (degenerative joint disease)   . DJD (degenerative joint disease)   . Fluid retention    mild  . H/O: gout   . History of blood clots   . HTN (hypertension)   . Hyperlipidemia   . Hyperlipidemia   . Myocardial infarction ([place]) 2009  . Obesity    with reduction  . OSA (obstructive sleep apnea)    AHI 98/hr  . Renal insufficiency   . Sleep apnea   . Stroke [facility]) 2000    Past Surgical History:  Procedure Laterality Date  . CORONARY ARTERY BYPASS GRAFT  2005   LIMA to LAD, SVG to OM1 and OM 2, RCA.   [name] left inguinal hernia repair    . LOOP RECORDER INSERTION N/A 01/03/2018   Procedure: LOOP RECORDER INSERTION;  Surgeon: Evans Lance, MD;  Location: [facility] CV LAB;  Service: Cardiovascular;  Laterality: N/A;  . TEE WITHOUT CARDIOVERSION N/A 12/11/2016   Procedure: TRANSESOPHAGEAL ECHOCARDIOGRAM (TEE);  Surgeon: [name], MD;  Location: [facility] ENDOSCOPY;  Service: Cardiovascular;  Laterality: N/A;  . TEE WITHOUT CARDIOVERSION  N/A 01/03/2018   Procedure: TRANSESOPHAGEAL ECHOCARDIOGRAM (TEE);  Surgeon: Dorothy Spark, MD;  Location: [facility] ENDOSCOPY;  Service: Cardiovascular;  Laterality: N/A;    There were no vitals filed for this visit.  Subjective Assessment - 04/05/18 1108    Patient is accompained by:  Family member   wife    Pertinent History  Hemiparesis affecting L side due to CVA.  PMH:  CAD, s/p CABGx4 2005, MI, HTN, dylipidemia, gout, arrhythmia, OSA, DJD, loop recorder    Patient Stated Goals  improve grip/use of L hand, to be able to grip steering wheel and close door in order to drive again    Currently in Pain?  No/denies        [facility] OT Assessment - 04/05/18 0001      Assessment   Medical Diagnosis  L hemiparesis due to CVA    Referring Provider (OT)  Dr. Alysia Penna    Onset Date/Surgical Date  01/05/18    Hand Dominance  Right    Prior Therapy  CIR, HH therapies ended in November      Precautions   Precautions  Fall   no driving currently     Balance Screen   Has the  patient fallen in the past 6 months  No      Home  Environment   Family/patient expects to be discharged to:  Private residence    Lives With  Spouse   and son     Prior Function   Level of Independence  Independent    Vocation  Retired    Leisure  enjoys Environmental consultant (on league, but hasn't since CVA)      ADL   ADL comments  BADLs mod I except tying shoes, difficulty opening jars, unable to pulling up pants/socks with LUE      IADL   Prior Level of Function Shopping  wife/pt performed together    Shopping  --   has not since CVA   Prior Level of Function Light Housekeeping  performed simple tasks    Light Housekeeping  Does not participate in any housekeeping tasks    Prior Level of Function Meal Prep  independent, wife did most but pt like to cook things like chicken and dumplings, potato casserole    Prior Level of Function Community Mobility  driving     [place] on family or friends for  transportation    Medication Management  Takes responsibility if medication is prepared in advance in seperate dosage   forgets occasionally, difficulty opening bottles   Prior Level of Function Financial Management  wife performs      Mobility   Mobility Status  Independent    Mobility Status Comments  see PT eval      Vision - History   Baseline Vision  Wears glasses only for reading      [place] not tested   pt denies changes     Activity Tolerance   Activity Tolerance Comments  Wife reports pt sits a lot       Cognition   Overall Cognitive Status  Within Functional Limits for tasks assessed   pt/wife deny changes     Posture/Postural Control   Posture/Postural Control  Postural limitations    Postural Limitations  Forward head;Rounded Shoulders      Sensation   Additional Comments  Pt reports occasional tingling in L arm      Coordination   9 Hole Peg Test  Right;Left    Right 9 Hole Peg Test  33    Left 9 Hole Peg Test  unable max difficulty with picking up pegs, unable to manipulate in L hand.    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), approx 50% thumb extension with mild pain at MP   L shoulder hike/compensations noted     PROM   Overall PROM   Deficits    Overall PROM Comments  L shoulder to approx 90*, approx 85% finger flex grossly with incr pain 3rd digit flex      Strength   Overall Strength  Deficits    Overall Strength Comments  L biceps/triceps 3/5      Hand Function   Right Hand Grip (lbs)  74    Left Hand Grip (lbs)  2                      OT  Education - 04/05/18 1335    Education Details  OT Eval results/POC; initial HEP--see pt instructions    Person(s) Educated  Patient;Spouse    Methods  Explanation;Demonstration;Verbal cues;Handout    Comprehension  Returned demonstration;Verbalized understanding;Need further instruction;Verbal cues required       OT Short Term Goals - 04/05/18 1354      OT SHORT TERM GOAL #1   Title  Pt will be independent with initial HEP.--check STGs 05/17/18    Time  6    Period  Weeks    Status  New      OT SHORT TERM GOAL #2   Title  Pt will demo at least 110* PROM L shoulder flex with no pain to assist with functional reach.    Time  6    Period  Weeks    Status  New      OT SHORT TERM GOAL #3   Title  Pt will be able to use L hand to assist with tying shoes, pulling up pants/socks.    Time  6    Period  Weeks    Status  New      OT SHORT TERM GOAL #4   Title  Pt will demo at least 80* L shoulder flex AROM with min compensation to be able to retrieve light object.    Time  6    Period  Weeks    Status  New      OT SHORT TERM GOAL #5   Title  Pt will demo at least 90% gross composite finger flex to assist with grasping objects with L hand.    Time  6    Period  Weeks    Status  New      OT SHORT TERM GOAL #6   Title  Pt will perform simple home maintenance task mod I.    Time  6    Period  Weeks    Status  New        OT Long Term Goals - 04/05/18 1358      OT LONG TERM GOAL #1   Title  Pt will be independent with updated HEP.--check LTGs 07/04/18    Time  12    Period  Weeks    Status  New      OT LONG TERM GOAL #2   Title  Pt will demo at least 20lbs L grip strength to assist in opening jars/containers.    Time  12    Period  Weeks    Status  New      OT LONG TERM GOAL #3   Title  Pt will perform simple cooking task with supervision.    Time  12    Period  Weeks    Status  New      OT LONG TERM GOAL #4   Title  Pt will improve functional reaching/coordination  as shown by improving score on box and blocks test by at least 10    Baseline  L-16 blocks    Time  6    Period  Weeks    Status  New      OT LONG TERM GOAL #5   Title  Pt will demo improved L hand coordination as shown by completing 9-hole peg test in  less than 2 min.    Baseline  unable    Time  6    Period  Weeks    Status  New      OT LONG TERM GOAL #6   Title  Pt will demo at least 90* L shoulder flex to retrieve lightweight object with no pain.    Time  6    Period  Weeks    Status  New            Plan - 04/05/18 1338    Clinical Impression Statement  Pt is a 78 y.o. male referred to occupational therapy for L hemiparesis due to CVA 12/2017.  Pt with PMH that includes:  CAD, s/p CABGx4 2005, MI, HTN, dylipidemia, gout, arrhythmia, OSA, DJD, loop recorder.  Pt presents today with L hemiparesis with decr LUE stregnth, coordination, ROM, decr LUE functional use, decr IADL performance.  Pt would benefit from occupational therapy to address these deficits for improved LUE functional use and improved ADL/IADL ease/independence.     Occupational Profile and client history currently impacting functional performance  Pt was independent prior to CVA.  Pt is now performing BADLs mod I with difficulty using LUE.  Pt is not currently performing previous IADLs like driving, cooking, home maintenance tasks or leisure tasks such as bowling.    Occupational performance deficits (Please refer to evaluation for details):  ADL's;IADL's;Leisure;Social Participation    Rehab Potential  Good    OT Frequency  2x / week    OT Duration  12 weeks   +eval   OT Treatment/Interventions  Self-care/ADL training;Electrical Stimulation;Therapeutic exercise;Patient/family education;Splinting;Neuromuscular education;Paraffin;Moist Heat;Aquatic Therapy;Fluidtherapy;Therapist, nutritional;Therapeutic activities;Balance training;Passive range of motion;Manual Therapy;DME and/or AE  instruction;Ultrasound;Cryotherapy;Contrast Bath    Plan  review and update HEP (supine closed-chain shoulder flex/abduction, finger ROM/coordination)    Clinical Decision Making  Several treatment options, min-mod task modification necessary    Consulted and Agree with Plan of Care  Patient;Family member/caregiver    Family Member Consulted  wife       Patient will benefit from skilled therapeutic intervention in order to improve the following deficits and impairments:  Decreased cognition, Pain, Decreased knowledge of use of DME, Decreased coordination, Decreased mobility, Decreased strength, Decreased range of motion, Decreased endurance, Decreased activity tolerance, Decreased balance, Impaired UE functional use  Visit Diagnosis: Hemiplegia and hemiparesis following cerebral infarction affecting left non-dominant side (HCC)  Other lack of coordination  Unsteadiness on feet  Stiffness of left shoulder, not elsewhere classified  Left shoulder pain, unspecified chronicity  Stiffness of left hand, not elsewhere classified    Problem List Patient Active Problem List   Diagnosis Date Noted  . Abnormal peripheral vision, left 01/21/2018  . Anemia, chronic disease   . Left hemiparesis ([place])   . Acute ischemic cerebrovascular accident (CVA) involving right middle cerebral artery territory [facility])   . CKD (chronic kidney disease), stage III ([place])   . Peripheral edema   . Stroke (cerebrum) ([place]) 01/05/2018  . History of gout   . AKI (acute kidney injury) ([place])   . Stage 3 chronic kidney disease ([place])   . Anemia of chronic disease   . Left-sided weakness   . Osteoarthritis   . OSA (obstructive sleep apnea)   . Noncompliance with CPAP treatment   . History of DVT (deep vein thrombosis)   . History of CVA (cerebrovascular accident)   . Dysphagia, post-stroke   . Sinus tachycardia   . Acute blood loss anemia   .  Acute CVA (cerebrovascular accident) ([place]) 01/01/2018  . Hypertensive  urgency 01/01/2018  . CKD (chronic kidney disease) stage 2, GFR 60-89 ml/min 01/01/2018  . 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 Generalized weakness   . Sepsis ([place]) 12/10/2016  . Bacteremia due to Streptococcus 12/10/2016  . Lactic acidosis 12/10/2016  . Elevated troponin 12/10/2016  . Renal insufficiency 12/10/2016  . Hypophosphatemia 12/10/2016  . Left leg swelling 12/10/2016  . Febrile illness 12/09/2016  . Febrile illness, acute   . OBESITY, UNSPECIFIED 05/31/2009  . Hyperlipidemia 05/30/2009  . Obstructive sleep apnea 05/30/2009  . Essential hypertension 05/30/2009  . CAD (coronary artery disease) 05/30/2009    [facility] 04/05/2018, 3:18 PM  [facility] [address] [place], [place], [address] Phone: [phone]   Fax:  [phone]  Name: [name] MRN: [id number] Date of Birth: [birth year]   Vianne Bulls, OTR/L [facility] [address] [place], [place]  [address] [phone] phone [phone] 04/05/18 3:18 PM

## 2018-04-06 ENCOUNTER — Ambulatory Visit (HOSPITAL_COMMUNITY): Payer: Medicare Other | Admitting: Nurse Practitioner

## 2018-04-08 ENCOUNTER — Telehealth: Payer: Self-pay | Admitting: *Deleted

## 2018-04-08 NOTE — Telephone Encounter (Signed)
Attempted to reach patient at home number--pt's son answered, pt not available. Pt's son not on DPR. He advised me to call pt's daughter, Colletta [place], who is on [place].  Attempted to reach [place], LMOVM requesting call back to the DC. Gave direct number. Will ask if pt symptomatic with ~6sec duration tachy episode on 04/05/18 at 12:03, median V rate 167bpm.

## 2018-04-11 ENCOUNTER — Encounter: Payer: Self-pay | Admitting: Physical Therapy

## 2018-04-11 ENCOUNTER — Ambulatory Visit: Payer: Medicare Other | Admitting: Physical Therapy

## 2018-04-11 ENCOUNTER — Ambulatory Visit: Payer: Medicare Other | Admitting: Occupational Therapy

## 2018-04-11 ENCOUNTER — Other Ambulatory Visit: Payer: Self-pay | Admitting: Adult Health

## 2018-04-11 ENCOUNTER — Encounter: Payer: Self-pay | Admitting: Occupational Therapy

## 2018-04-11 DIAGNOSIS — R2689 Other abnormalities of gait and mobility: Secondary | ICD-10-CM

## 2018-04-11 DIAGNOSIS — M25642 Stiffness of left hand, not elsewhere classified: Secondary | ICD-10-CM

## 2018-04-11 DIAGNOSIS — I69354 Hemiplegia and hemiparesis following cerebral infarction affecting left non-dominant side: Secondary | ICD-10-CM

## 2018-04-11 DIAGNOSIS — R278 Other lack of coordination: Secondary | ICD-10-CM

## 2018-04-11 DIAGNOSIS — M25512 Pain in left shoulder: Secondary | ICD-10-CM

## 2018-04-11 DIAGNOSIS — M25612 Stiffness of left shoulder, not elsewhere classified: Secondary | ICD-10-CM

## 2018-04-11 DIAGNOSIS — R2681 Unsteadiness on feet: Secondary | ICD-10-CM

## 2018-04-11 NOTE — Patient Instructions (Addendum)
   Upper Extremity: [employer]  down.  Hold a ball/shoe box/pillow (shoulder width sized)  with arms straight and hands flat on either side. Start with ball on chest, then straighten elbows to push ball up to the ceiling trying to keep elbows next to body hold 3sec, then slowly lower by keeping elbows close to your side. Keep shoulders away from ears Repeat 10 times.  Rest, then do 10 more, 2x/day   Cranial Flexion: Overhead Arm Extension - Supine ([facility])     Lay down.  Hold a ball/shoe box/pillow (shoulder width sized) on tops of legs with arms straight and hands flat on either side. Slowly move arms up/back in an arc to shoulder height with elbows straight and then slowly lower back down. Keep shoulders away from ears. Repeat 10 times.    Rest, then do 10 more, 2x/day     Pick up checkers and place in container/bowl.  You can drag it off edge to pinch.  Keep elbow down low and shoulder down when you put in bowl.   Flip playing cards with left hand.  Keep shoulder down.

## 2018-04-11 NOTE — Therapy (Signed)
[facility] [address] [place], [place], [address] Phone: [phone]   Fax:  [phone]  Occupational Therapy Treatment  Patient Details  Name: [name] MRN: [id number] Date of Birth: 1940-06-16 Referring Provider (OT): Dr. Alysia Penna   Encounter Date: 04/11/2018  OT End of Session - 04/11/18 0810    Visit Number  2    Number of Visits  25    Date for OT Re-Evaluation  07/04/18    Authorization Type  [employer] Medicare     Authorization - Visit Number  2    Authorization - Number of Visits  10    OT Start Time  [phone]    OT Stop Time  0830    OT Time Calculation (min)  42 min    Equipment Utilized During Treatment  **FOTO    Activity Tolerance  Patient tolerated treatment well    Behavior During Therapy  [facility] for tasks assessed/performed       Past Medical History:  Diagnosis Date  . CAD (coronary artery disease)    s/p cath in 2005 and CABG x4  . Cataract   . DJD (degenerative joint disease)   . DJD (degenerative joint disease)   . Fluid retention    mild  . H/O: gout   . History of blood clots   . HTN (hypertension)   . Hyperlipidemia   . Hyperlipidemia   . Myocardial infarction ([place]) 2009  . Obesity    with reduction  . OSA (obstructive sleep apnea)    AHI 98/hr  . Renal insufficiency   . Sleep apnea   . Stroke [facility]) 2000    Past Surgical History:  Procedure Laterality Date  . CORONARY ARTERY BYPASS GRAFT  2005   LIMA to LAD, SVG to OM1 and OM 2, RCA.   [name] left inguinal hernia repair    . LOOP RECORDER INSERTION N/A 01/03/2018   Procedure: LOOP RECORDER INSERTION;  Surgeon: Evans Lance, MD;  Location: [facility] CV LAB;  Service: Cardiovascular;  Laterality: N/A;  . TEE WITHOUT CARDIOVERSION N/A 12/11/2016   Procedure: TRANSESOPHAGEAL ECHOCARDIOGRAM (TEE);  Surgeon: [name], MD;  Location: [facility] ENDOSCOPY;  Service: Cardiovascular;  Laterality: N/A;  . TEE WITHOUT CARDIOVERSION  N/A 01/03/2018   Procedure: TRANSESOPHAGEAL ECHOCARDIOGRAM (TEE);  Surgeon: Dorothy Spark, MD;  Location: [facility] ENDOSCOPY;  Service: Cardiovascular;  Laterality: N/A;    There were no vitals filed for this visit.  Subjective Assessment - 04/11/18 0750    Patient is accompained by:  Family member   wife    Pertinent History  Hemiparesis affecting L side due to CVA.  PMH:  CAD, s/p CABGx4 2005, MI, HTN, dylipidemia, gout, arrhythmia, OSA, DJD, loop recorder    Patient Stated Goals  improve grip/use of L hand, to be able to grip steering wheel and close door in order to drive again    Currently in Pain?  No/denies       PROM to L hand in composite finger flex, isolated MP flex followed by place and holds.   Removing cylinder pegs from pegboard and then replacing with mod cueing for normal movement patterns (hand position and to avoid shoulder hike).  Performance improved with repetition.          OT Education - 04/11/18 [phone]    Education Details  Reviewed initial HEP and added exercises--see pt instructions    Person(s) Educated  Patient    Methods  Explanation;Demonstration;Handout;Verbal  cues;Tactile cues    Comprehension  Verbalized understanding;Returned demonstration;Need further instruction;Verbal cues required   mod cueing for compensation patterns      OT Short Term Goals - 04/05/18 1354      OT SHORT TERM GOAL #1   Title  Pt will be independent with initial HEP.--check STGs 05/17/18    Time  6    Period  Weeks    Status  New      OT SHORT TERM GOAL #2   Title  Pt will demo at least 110* PROM L shoulder flex with no pain to assist with functional reach.    Time  6    Period  Weeks    Status  New      OT SHORT TERM GOAL #3   Title  Pt will be able to use L hand to assist with tying shoes, pulling up pants/socks.    Time  6    Period  Weeks    Status  New      OT SHORT TERM GOAL #4   Title  Pt will demo at least 80* L shoulder flex AROM with min compensation  to be able to retrieve light object.    Time  6    Period  Weeks    Status  New      OT SHORT TERM GOAL #5   Title  Pt will demo at least 90% gross composite finger flex to assist with grasping objects with L hand.    Time  6    Period  Weeks    Status  New      OT SHORT TERM GOAL #6   Title  Pt will perform simple home maintenance task mod I.    Time  6    Period  Weeks    Status  New        OT Long Term Goals - 04/05/18 1358      OT LONG TERM GOAL #1   Title  Pt will be independent with updated HEP.--check LTGs 07/04/18    Time  12    Period  Weeks    Status  New      OT LONG TERM GOAL #2   Title  Pt will demo at least 20lbs L grip strength to assist in opening jars/containers.    Time  12    Period  Weeks    Status  New      OT LONG TERM GOAL #3   Title  Pt will perform simple cooking task with supervision.    Time  12    Period  Weeks    Status  New      OT LONG TERM GOAL #4   Title  Pt will improve functional reaching/coordination as shown by improving score on box and blocks test by at least 10    Baseline  L-16 blocks    Time  6    Period  Weeks    Status  New      OT LONG TERM GOAL #5   Title  Pt will demo improved L hand coordination as shown by completing 9-hole peg test in less than 2 min.    Baseline  unable    Time  6    Period  Weeks    Status  New      OT LONG TERM GOAL #6   Title  Pt will demo at least 90* L shoulder flex to retrieve lightweight object with  no pain.    Time  6    Period  Weeks    Status  New            Plan - 04/11/18 0810    Clinical Impression Statement  Pt demo compensation patterns with LUE functional use.  Pt needs mod cueing for this.  Pt is progressing with improving L hand ROM for functional grasp.    Occupational Profile and client history currently impacting functional performance  Pt was independent prior to CVA.  Pt is now performing BADLs mod I with difficulty using LUE.  Pt is not currently performing  previous IADLs like driving, cooking, home maintenance tasks or leisure tasks such as bowling.    Occupational performance deficits (Please refer to evaluation for details):  ADL's;IADL's;Leisure;Social Participation    Rehab Potential  Good    OT Frequency  2x / week    OT Duration  12 weeks   +eval   OT Treatment/Interventions  Self-care/ADL training;Electrical Stimulation;Therapeutic exercise;Patient/family education;Splinting;Neuromuscular education;Paraffin;Moist Heat;Aquatic Therapy;Fluidtherapy;Therapist, nutritional;Therapeutic activities;Balance training;Passive range of motion;Manual Therapy;DME and/or AE instruction;Ultrasound;Cryotherapy;Contrast Bath    Plan  review updates to HEP, continue with neuro re-ed, LUE functional use    Clinical Decision Making  Several treatment options, min-mod task modification necessary    Consulted and Agree with Plan of Care  Patient;Family member/caregiver    Family Member Consulted  wife       Patient will benefit from skilled therapeutic intervention in order to improve the following deficits and impairments:  Decreased cognition, Pain, Decreased knowledge of use of DME, Decreased coordination, Decreased mobility, Decreased strength, Decreased range of motion, Decreased endurance, Decreased activity tolerance, Decreased balance, Impaired UE functional use  Visit Diagnosis: Hemiplegia and hemiparesis following cerebral infarction affecting left non-dominant side (HCC)  Other lack of coordination  Unsteadiness on feet  Stiffness of left shoulder, not elsewhere classified  Left shoulder pain, unspecified chronicity  Stiffness of left hand, not elsewhere classified  Other abnormalities of gait and mobility    Problem List Patient Active Problem List   Diagnosis Date Noted  . Abnormal peripheral vision, left 01/21/2018  . Anemia, chronic disease   . Left hemiparesis ([place])   . Acute ischemic cerebrovascular accident (CVA)  involving right middle cerebral artery territory [facility])   . CKD (chronic kidney disease), stage III ([place])   . Peripheral edema   . Stroke (cerebrum) ([place]) 01/05/2018  . History of gout   . AKI (acute kidney injury) ([place])   . Stage 3 chronic kidney disease ([place])   . Anemia of chronic disease   . Left-sided weakness   . Osteoarthritis   . OSA (obstructive sleep apnea)   . Noncompliance with CPAP treatment   . History of DVT (deep vein thrombosis)   . History of CVA (cerebrovascular accident)   . Dysphagia, post-stroke   . Sinus tachycardia   . Acute blood loss anemia   . Acute CVA (cerebrovascular accident) ([place]) 01/01/2018  . Hypertensive urgency 01/01/2018  . CKD (chronic kidney disease) stage 2, GFR 60-89 ml/min 01/01/2018  . 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 Generalized weakness   . Sepsis ([place]) 12/10/2016  . Bacteremia due to Streptococcus 12/10/2016  . Lactic acidosis 12/10/2016  . Elevated troponin 12/10/2016  . Renal insufficiency 12/10/2016  . Hypophosphatemia 12/10/2016  . Left leg swelling 12/10/2016  . Febrile illness 12/09/2016  . Febrile illness, acute   . OBESITY, UNSPECIFIED 05/31/2009  . Hyperlipidemia 05/30/2009  . Obstructive sleep  apnea 05/30/2009  . Essential hypertension 05/30/2009  . CAD (coronary artery disease) 05/30/2009    [facility] 04/11/2018, 8:25 AM  [facility] [address] [place], [place], [address] Phone: [phone]   Fax:  [phone]  Name: [name] MRN: [id number] Date of Birth: December 06, 1940   Vianne Bulls, OTR/L [facility] [address] [place], [place]  [address] [phone] phone [phone] 04/11/18 8:25 AM

## 2018-04-11 NOTE — Therapy (Signed)
[facility] [address] [place], [place], [address] Phone: [phone]   Fax:  [phone]  Physical Therapy Treatment  Patient Details  Name: [name] MRN: [id number] Date of Birth: [birth year] Referring Provider (PT): Alysia Penna, MD   Encounter Date: 04/11/2018  PT End of Session - 04/11/18 0848    Visit Number  2    Number of Visits  17    Date for PT Re-Evaluation  07/04/18   POC written for 90 days, plan to see for 60 days   Authorization Type  Medicare-Needs 10th visit progress note    PT Start Time  0846    PT Stop Time  0930    PT Time Calculation (min)  44 min    Activity Tolerance  Patient tolerated treatment well    Behavior During Therapy  [facility] for tasks assessed/performed       Past Medical History:  Diagnosis Date  . CAD (coronary artery disease)    s/p cath in 2005 and CABG x4  . Cataract   . DJD (degenerative joint disease)   . DJD (degenerative joint disease)   . Fluid retention    mild  . H/O: gout   . History of blood clots   . HTN (hypertension)   . Hyperlipidemia   . Hyperlipidemia   . Myocardial infarction ([place]) 2009  . Obesity    with reduction  . OSA (obstructive sleep apnea)    AHI 98/hr  . Renal insufficiency   . Sleep apnea   . Stroke [facility]) 2000    Past Surgical History:  Procedure Laterality Date  . CORONARY ARTERY BYPASS GRAFT  2005   LIMA to LAD, SVG to OM1 and OM 2, RCA.   [name] left inguinal hernia repair    . LOOP RECORDER INSERTION N/A 01/03/2018   Procedure: LOOP RECORDER INSERTION;  Surgeon: Evans Lance, MD;  Location: [address] CV LAB;  Service: Cardiovascular;  Laterality: N/A;  . TEE WITHOUT CARDIOVERSION N/A 12/11/2016   Procedure: TRANSESOPHAGEAL ECHOCARDIOGRAM (TEE);  Surgeon: [name], MD;  Location: [facility] ENDOSCOPY;  Service: Cardiovascular;  Laterality: N/A;  . TEE WITHOUT CARDIOVERSION N/A 01/03/2018   Procedure: TRANSESOPHAGEAL  ECHOCARDIOGRAM (TEE);  Surgeon: Dorothy Spark, MD;  Location: [facility] ENDOSCOPY;  Service: Cardiovascular;  Laterality: N/A;    There were no vitals filed for this visit.  Subjective Assessment - 04/11/18 0847    Subjective  No new complaints. No falls or pain to report.     Pertinent History  CAD s/p CABG 2005, HTN, HLD, OSA, DVTs    Limitations  Walking;House hold activities    Patient Stated Goals  "I would like to be able to walk outside better."     Currently in Pain?  No/denies         [facility] PT Assessment - 04/11/18 0849      6 Minute Walk- Baseline   6 Minute Walk- Baseline  yes    BP (mmHg)  (!) 126/94    HR (bpm)  59    02 Sat (%RA)  98 %    Modified Borg Scale for Dyspnea  0- Nothing at all    Perceived Rate of Exertion (Borg)  6-      6 Minute walk- Post Test   6 Minute Walk Post Test  yes    BP (mmHg)  (!) 153/93    HR (bpm)  70    02 Sat (%RA)  99 %  Modified Borg Scale for Dyspnea  0- Nothing at all    Perceived Rate of Exertion (Borg)  9- very light      6 minute walk test results    Aerobic Endurance Distance Walked  1031    Endurance additional comments  no AD, no rest breaks. left foot drop/slap noted with gait.       Issued the following to pt's HEP today. Min guard assist for balance. Cues for correct ex form/technique. Did not issues toe walking fwd/bwd due to amount of assistance needed for balance with poor form.   Access Code: [facility]  URL: [email or web address]  Date: 04/11/2018  Prepared by: Willow Ora   Exercises  Sit to Stand - 10 reps - 1 sets - 1x daily - 5x weekly  Standing Hip Abduction with Counter Support - 10 reps - 1 sets - 1x daily - 5x weekly  Standing Hip Extension with Counter Support - 10 reps - 1 sets - 1x daily - 5x weekly  Walking March - 3 reps - 1 sets - 1x daily - 5x weekly  Tandem Walking with Counter Support - 3 reps - 1 sets - 1x daily - 5x weekly          PT Education - 04/11/18 0925     Education Details  HEP for strengthening/balance, walking program    Person(s) Educated  Patient    Methods  Explanation;Demonstration;Tactile cues;Verbal cues;Handout    Comprehension  Verbalized understanding;Returned demonstration;Verbal cues required;Tactile cues required;Need further instruction       PT Short Term Goals - 04/05/18 1959      PT SHORT TERM GOAL #1   Title  Pt will initiate HEP in order to indicate improved functional mobility and balance.  (Target Date: 05/05/2018)    Time  4    Period  Weeks    Status  New    Target Date  05/05/18      PT SHORT TERM GOAL #2   Title  Pt will improve gait speed to >/=3.60 ft/sec in order to indicate improved efficiency of gait.     Time  4    Period  Weeks    Status  New      PT SHORT TERM GOAL #3   Title  Pt will improve 5TSS to </=11 secs without UE support with decreased BOS in order to indicate decreased fall risk.     Time  4    Period  Weeks    Status  New      PT SHORT TERM GOAL #4   Title  Pt will improve FGA to >/=21/30 in order to indicate decreased fall risk.     Time  4    Period  Weeks    Status  New      PT SHORT TERM GOAL #5   Title  Pt will perform 6MWT and improve distance by 150' in order to indicate improved functional endurance.     Time  4    Period  Weeks    Status  New      Additional Short Term Goals   Additional Short Term Goals  Yes      PT SHORT TERM GOAL #6   Title  Pt will ambulate up to 500' over unlevel paved outdoor surfaces w/o AD at S level in order to indicate improved community mobility.     Time  4    Period  Weeks    Status  New  PT Long Term Goals - 04/05/18 2003      PT LONG TERM GOAL #1   Title  Pt will be independent with final HEP in order to indicate decreased fall risk and improved functional mobility.  (Target Date: 06/04/2018)    Time  8    Period  Weeks    Status  New    Target Date  06/04/18      PT LONG TERM GOAL #2   Title  Pt will improve FGA to  >/=24/30 in order to indicate decreased fall risk.      Time  8    Period  Weeks    Status  New      PT LONG TERM GOAL #3   Title  Pt will improve 6MWT by 300' from baseline in order to indicate improved functional endurance.     Time  8    Period  Weeks    Status  New      PT LONG TERM GOAL #4   Title  Pt will ambulate up to 1000' over varying outdoor surfaces (grass, inclines/declines, curb/ramp) without AD at mod I level in order to indicate improved community mobility.      Time  8    Period  Weeks    Status  New      PT LONG TERM GOAL #5   Title  Pt will demonstrate ability to simulate bowling without overt LOB in order to return to leisure activity.     Time  8    Period  Weeks    Status  New            Plan - 04/11/18 0848    Clinical Impression Statement  Today's skilled session established baseline for 6 minute walk test and then focused on establishment of an HEP to address endurance (walking program), balance and strengtheing without any issues reported.  Pt noted to have poor foot clearance with left LE with gait. ? try a brace such as foot up at next session.     Rehab Potential  Good    Clinical Impairments Affecting Rehab Potential  Pt motivated to improve mobility    PT Frequency  2x / week    PT Duration  8 weeks    PT Treatment/Interventions  ADLs/Self Care Home Management;Aquatic Therapy;Electrical Stimulation;Gait training;Stair training;DME Instruction;Functional mobility training;Therapeutic activities;Therapeutic exercise;Balance training;Neuromuscular re-education;Patient/family education;Orthotic Fit/Training;Passive range of motion;Vestibular    PT Next Visit Plan  work on improved L foot clearance wiht gait (? foot up brace or other orthotic), outdoor gait,     Consulted and Agree with Plan of Care  Patient    Family Member Consulted  --       Patient will benefit from skilled therapeutic intervention in order to improve the following deficits and  impairments:  Abnormal gait, Decreased activity tolerance, Decreased balance, Decreased endurance, Decreased mobility, Decreased range of motion, Impaired perceived functional ability, Impaired flexibility, Impaired UE functional use, Postural dysfunction  Visit Diagnosis: Hemiplegia and hemiparesis following cerebral infarction affecting left non-dominant side (HCC)  Unsteadiness on feet  Other abnormalities of gait and mobility     Problem List Patient Active Problem List   Diagnosis Date Noted  . Abnormal peripheral vision, left 01/21/2018  . Anemia, chronic disease   . Left hemiparesis ([place])   . Acute ischemic cerebrovascular accident (CVA) involving right middle cerebral artery territory [facility])   . CKD (chronic kidney disease), stage III ([place])   . Peripheral edema   .  Stroke (cerebrum) ([place]) 01/05/2018  . History of gout   . AKI (acute kidney injury) ([place])   . Stage 3 chronic kidney disease ([place])   . Anemia of chronic disease   . Left-sided weakness   . Osteoarthritis   . OSA (obstructive sleep apnea)   . Noncompliance with CPAP treatment   . History of DVT (deep vein thrombosis)   . History of CVA (cerebrovascular accident)   . Dysphagia, post-stroke   . Sinus tachycardia   . Acute blood loss anemia   . Acute CVA (cerebrovascular accident) ([place]) 01/01/2018  . Hypertensive urgency 01/01/2018  . CKD (chronic kidney disease) stage 2, GFR 60-89 ml/min 01/01/2018  . 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 Generalized weakness   . Sepsis ([place]) 12/10/2016  . Bacteremia due to Streptococcus 12/10/2016  . Lactic acidosis 12/10/2016  . Elevated troponin 12/10/2016  . Renal insufficiency 12/10/2016  . Hypophosphatemia 12/10/2016  . Left leg swelling 12/10/2016  . Febrile illness 12/09/2016  . Febrile illness, acute   . OBESITY, UNSPECIFIED 05/31/2009  . Hyperlipidemia 05/30/2009  . Obstructive sleep apnea 05/30/2009  . Essential hypertension 05/30/2009   . CAD (coronary artery disease) 05/30/2009    Willow Ora, PTA, [facility] Outpatient Neuro [facility] [address], [place], [place] [address] [phone] 04/11/18, 11:44 AM   Name: [name] MRN: [id number] Date of Birth: 1941/02/05

## 2018-04-11 NOTE — Patient Instructions (Addendum)
WALKING  Walking is a great form of exercise to increase your strength, endurance and overall fitness.  A walking program can help you start slowly and gradually build endurance as you go.  Everyone's ability is different, so each person's starting point will be different.  You do not have to follow them exactly.  The are just samples. You should simply find out what's right for you and stick to that program.   In the beginning, you'll start off walking 2-3 times a day for short distances.  As you get stronger, you'll be walking further at just 1-2 times per day.  A. You Can Walk For A Certain Length Of Time Each Day    Walk 6 minutes 2 times per day.  Increase 1-2 minutes every 7 days (2 times per day).  Work up to 25-30 minutes (1-2 times per day).  Access Code: [facility]  URL: [email or web address]  Date: 04/11/2018  Prepared by: Willow Ora   Exercises  Sit to Stand - 10 reps - 1 sets - 1x daily - 5x weekly  Standing Hip Abduction with Counter Support - 10 reps - 1 sets - 1x daily - 5x weekly  Standing Hip Extension with Counter Support - 10 reps - 1 sets - 1x daily - 5x weekly  Walking March - 3 reps - 1 sets - 1x daily - 5x weekly  Tandem Walking with Counter Support - 3 reps - 1 sets - 1x daily - 5x weekly

## 2018-04-12 ENCOUNTER — Encounter: Payer: Medicare Other | Admitting: Occupational Therapy

## 2018-04-12 ENCOUNTER — Ambulatory Visit (INDEPENDENT_AMBULATORY_CARE_PROVIDER_SITE_OTHER): Payer: Medicare Other

## 2018-04-12 ENCOUNTER — Ambulatory Visit: Payer: Medicare Other | Admitting: Physical Therapy

## 2018-04-12 DIAGNOSIS — I63511 Cerebral infarction due to unspecified occlusion or stenosis of right middle cerebral artery: Secondary | ICD-10-CM | POA: Diagnosis not present

## 2018-04-12 LAB — CUP PACEART REMOTE DEVICE CHECK
Implantable Pulse Generator Implant Date: 20191104
MDC IDC SESS DTM: 20200211175943

## 2018-04-13 NOTE — Telephone Encounter (Signed)
LMOVM for patient requesting call back to the DC. Gave direct number for return call.

## 2018-04-15 NOTE — Telephone Encounter (Signed)
Third attempt:  [facility] requesting call back to the DC. Gave direct number for return call.  Tachy episode ECG placed in Dr. Macon Large folder for review.

## 2018-04-20 ENCOUNTER — Encounter: Payer: Self-pay | Admitting: Occupational Therapy

## 2018-04-20 ENCOUNTER — Ambulatory Visit: Payer: Medicare Other | Admitting: Physical Therapy

## 2018-04-20 ENCOUNTER — Encounter: Payer: Self-pay | Admitting: Physical Therapy

## 2018-04-20 ENCOUNTER — Ambulatory Visit: Payer: Medicare Other | Admitting: Occupational Therapy

## 2018-04-20 VITALS — BP 157/87 | HR 46

## 2018-04-20 DIAGNOSIS — I69354 Hemiplegia and hemiparesis following cerebral infarction affecting left non-dominant side: Secondary | ICD-10-CM

## 2018-04-20 DIAGNOSIS — R2681 Unsteadiness on feet: Secondary | ICD-10-CM

## 2018-04-20 DIAGNOSIS — M25612 Stiffness of left shoulder, not elsewhere classified: Secondary | ICD-10-CM

## 2018-04-20 DIAGNOSIS — R278 Other lack of coordination: Secondary | ICD-10-CM

## 2018-04-20 DIAGNOSIS — R2689 Other abnormalities of gait and mobility: Secondary | ICD-10-CM

## 2018-04-20 DIAGNOSIS — M25512 Pain in left shoulder: Secondary | ICD-10-CM

## 2018-04-20 DIAGNOSIS — M25642 Stiffness of left hand, not elsewhere classified: Secondary | ICD-10-CM

## 2018-04-20 NOTE — Therapy (Signed)
[facility] [address] [place], [place], [address] Phone: [phone]   Fax:  [phone]  Occupational Therapy Treatment  Patient Details  Name: [name] MRN: [id number] Date of Birth: 06-05-1940 Referring Provider (OT): Dr. Alysia Penna   Encounter Date: 04/20/2018  OT End of Session - 04/20/18 1327    Visit Number  3    Number of Visits  25    Date for OT Re-Evaluation  07/04/18    Authorization Type  [employer] Medicare     Authorization - Visit Number  3    Authorization - Number of Visits  10    OT Start Time  5366    OT Stop Time  1400    OT Time Calculation (min)  39 min    Equipment Utilized During Treatment  **FOTO    Activity Tolerance  Patient tolerated treatment well    Behavior During Therapy  [facility] for tasks assessed/performed       Past Medical History:  Diagnosis Date  . CAD (coronary artery disease)    s/p cath in 2005 and CABG x4  . Cataract   . DJD (degenerative joint disease)   . DJD (degenerative joint disease)   . Fluid retention    mild  . H/O: gout   . History of blood clots   . HTN (hypertension)   . Hyperlipidemia   . Hyperlipidemia   . Myocardial infarction ([place]) 2009  . Obesity    with reduction  . OSA (obstructive sleep apnea)    AHI 98/hr  . Renal insufficiency   . Sleep apnea   . Stroke [facility]) 2000    Past Surgical History:  Procedure Laterality Date  . CORONARY ARTERY BYPASS GRAFT  2005   LIMA to LAD, SVG to OM1 and OM 2, RCA.   [name] left inguinal hernia repair    . LOOP RECORDER INSERTION N/A 01/03/2018   Procedure: LOOP RECORDER INSERTION;  Surgeon: Evans Lance, MD;  Location: [place] CV LAB;  Service: Cardiovascular;  Laterality: N/A;  . TEE WITHOUT CARDIOVERSION N/A 12/11/2016   Procedure: TRANSESOPHAGEAL ECHOCARDIOGRAM (TEE);  Surgeon: [name], MD;  Location: [facility] ENDOSCOPY;  Service: Cardiovascular;  Laterality: N/A;  . TEE WITHOUT CARDIOVERSION  N/A 01/03/2018   Procedure: TRANSESOPHAGEAL ECHOCARDIOGRAM (TEE);  Surgeon: Dorothy Spark, MD;  Location: [facility] ENDOSCOPY;  Service: Cardiovascular;  Laterality: N/A;    There were no vitals filed for this visit.  Subjective Assessment - 04/20/18 1323    Subjective   HEP going well, been flipping dominos    Patient is accompained by:  Family member   wife    Pertinent History  Hemiparesis affecting L side due to CVA.  PMH:  CAD, s/p CABGx4 2005, MI, HTN, dylipidemia, gout, arrhythmia, OSA, DJD, loop recorder    Patient Stated Goals  improve grip/use of L hand, to be able to grip steering wheel and close door in order to drive again    Currently in Pain?  No/denies        In supine, AAROM BUE shoulder flex and chest press with ball x2 sets of 10 reps with min facilitation/cues for normal movement patterns.   In sitting, wt. Bearing through L hand with body on arm movements for incr L shoulder stability, neuro re-ed, and tricep activation with min cueing.  In sitting, open chain functional reaching (low-mid range) to grasp cylinder objects with min cueing/facilitation for normal movement patterns, pt demo difficulty  with cylindrical grasp.    Flipping cards with L hand with focus/min-mod cueing for supination.  Flipping dominos  With in-hand manipulation with mod difficulty.  Then stacking dominos with mod difficulty for coordination.           OT Short Term Goals - 04/05/18 1354      OT SHORT TERM GOAL #1   Title  Pt will be independent with initial HEP.--check STGs 05/17/18    Time  6    Period  Weeks    Status  New      OT SHORT TERM GOAL #2   Title  Pt will demo at least 110* PROM L shoulder flex with no pain to assist with functional reach.    Time  6    Period  Weeks    Status  New      OT SHORT TERM GOAL #3   Title  Pt will be able to use L hand to assist with tying shoes, pulling up pants/socks.    Time  6    Period  Weeks    Status  New      OT SHORT TERM  GOAL #4   Title  Pt will demo at least 80* L shoulder flex AROM with min compensation to be able to retrieve light object.    Time  6    Period  Weeks    Status  New      OT SHORT TERM GOAL #5   Title  Pt will demo at least 90% gross composite finger flex to assist with grasping objects with L hand.    Time  6    Period  Weeks    Status  New      OT SHORT TERM GOAL #6   Title  Pt will perform simple home maintenance task mod I.    Time  6    Period  Weeks    Status  New        OT Long Term Goals - 04/05/18 1358      OT LONG TERM GOAL #1   Title  Pt will be independent with updated HEP.--check LTGs 07/04/18    Time  12    Period  Weeks    Status  New      OT LONG TERM GOAL #2   Title  Pt will demo at least 20lbs L grip strength to assist in opening jars/containers.    Time  12    Period  Weeks    Status  New      OT LONG TERM GOAL #3   Title  Pt will perform simple cooking task with supervision.    Time  12    Period  Weeks    Status  New      OT LONG TERM GOAL #4   Title  Pt will improve functional reaching/coordination as shown by improving score on box and blocks test by at least 10    Baseline  L-16 blocks    Time  6    Period  Weeks    Status  New      OT LONG TERM GOAL #5   Title  Pt will demo improved L hand coordination as shown by completing 9-hole peg test in less than 2 min.    Baseline  unable    Time  6    Period  Weeks    Status  New      OT LONG TERM GOAL #  6   Title  Pt will demo at least 90* L shoulder flex to retrieve lightweight object with no pain.    Time  6    Period  Weeks    Status  New            Plan - 04/20/18 1327    Clinical Impression Statement  Pt is progressing with improving AAROM and LUE functional use.  Pt reports that he is able to pull up pants and some socks now with LUE.    Occupational Profile and client history currently impacting functional performance  Pt was independent prior to CVA.  Pt is now performing  BADLs mod I with difficulty using LUE.  Pt is not currently performing previous IADLs like driving, cooking, home maintenance tasks or leisure tasks such as bowling.    Occupational performance deficits (Please refer to evaluation for details):  ADL's;IADL's;Leisure;Social Participation    Rehab Potential  Good    OT Frequency  2x / week    OT Duration  12 weeks   +eval   OT Treatment/Interventions  Self-care/ADL training;Electrical Stimulation;Therapeutic exercise;Patient/family education;Splinting;Neuromuscular education;Paraffin;Moist Heat;Aquatic Therapy;Fluidtherapy;Therapist, nutritional;Therapeutic activities;Balance training;Passive range of motion;Manual Therapy;DME and/or AE instruction;Ultrasound;Cryotherapy;Contrast [employer]  continue with neuro re-ed, LUE functional use    Clinical Decision Making  Several treatment options, min-mod task modification necessary    Consulted and Agree with Plan of Care  Patient;Family member/caregiver    Family Member Consulted  wife       Patient will benefit from skilled therapeutic intervention in order to improve the following deficits and impairments:  Decreased cognition, Pain, Decreased knowledge of use of DME, Decreased coordination, Decreased mobility, Decreased strength, Decreased range of motion, Decreased endurance, Decreased activity tolerance, Decreased balance, Impaired UE functional use  Visit Diagnosis: Hemiplegia and hemiparesis following cerebral infarction affecting left non-dominant side (HCC)  Unsteadiness on feet  Other abnormalities of gait and mobility  Other lack of coordination  Stiffness of left shoulder, not elsewhere classified  Left shoulder pain, unspecified chronicity  Stiffness of left hand, not elsewhere classified    Problem List Patient Active Problem List   Diagnosis Date Noted  . Abnormal peripheral vision, left 01/21/2018  . Anemia, chronic disease   . Left hemiparesis ([place])   . Acute  ischemic cerebrovascular accident (CVA) involving right middle cerebral artery territory [facility])   . CKD (chronic kidney disease), stage III ([place])   . Peripheral edema   . Stroke (cerebrum) ([place]) 01/05/2018  . History of gout   . AKI (acute kidney injury) ([place])   . Stage 3 chronic kidney disease ([place])   . Anemia of chronic disease   . Left-sided weakness   . Osteoarthritis   . OSA (obstructive sleep apnea)   . Noncompliance with CPAP treatment   . History of DVT (deep vein thrombosis)   . History of CVA (cerebrovascular accident)   . Dysphagia, post-stroke   . Sinus tachycardia   . Acute blood loss anemia   . Acute CVA (cerebrovascular accident) ([place]) 01/01/2018  . Hypertensive urgency 01/01/2018  . CKD (chronic kidney disease) stage 2, GFR 60-89 ml/min 01/01/2018  . 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 Generalized weakness   . Sepsis ([place]) 12/10/2016  . Bacteremia due to Streptococcus 12/10/2016  . Lactic acidosis 12/10/2016  . Elevated troponin 12/10/2016  . Renal insufficiency 12/10/2016  . Hypophosphatemia 12/10/2016  . Left leg swelling 12/10/2016  . Febrile illness 12/09/2016  . Febrile illness,  acute   . OBESITY, UNSPECIFIED 05/31/2009  . Hyperlipidemia 05/30/2009  . Obstructive sleep apnea 05/30/2009  . Essential hypertension 05/30/2009  . CAD (coronary artery disease) 05/30/2009    [facility] 04/20/2018, 3:16 PM  [facility] [address] [place], [place], [address] Phone: [phone]   Fax:  [phone]  Name: [name] MRN: [id number] Date of Birth: 12/02/1940   Vianne Bulls, OTR/L [facility] [address] [place], [place]  [address] [phone] phone [phone] 04/20/18 3:16 PM

## 2018-04-21 NOTE — Therapy (Signed)
[facility] [address] [place], [place], [address] Phone: [phone]   Fax:  [phone]  Physical Therapy Treatment  Patient Details  Name: [name] MRN: [id number] Date of Birth: 30-May-1940 Referring Provider (PT): Alysia Penna, MD   Encounter Date: 04/20/2018  PT End of Session - 04/20/18 1452    Visit Number  3    Number of Visits  17    Date for PT Re-Evaluation  07/04/18   POC written for 90 days, plan to see for 60 days   Authorization Type  Medicare-Needs 10th visit progress note    PT Start Time  1449    PT Stop Time  1533    PT Time Calculation (min)  44 min    Equipment Utilized During Treatment  Gait belt    Activity Tolerance  Patient tolerated treatment well;No increased pain    Behavior During Therapy  WFL for tasks assessed/performed       Past Medical History:  Diagnosis Date  . CAD (coronary artery disease)    s/p cath in 2005 and CABG x4  . Cataract   . DJD (degenerative joint disease)   . DJD (degenerative joint disease)   . Fluid retention    mild  . H/O: gout   . History of blood clots   . HTN (hypertension)   . Hyperlipidemia   . Hyperlipidemia   . Myocardial infarction ([place]) 2009  . Obesity    with reduction  . OSA (obstructive sleep apnea)    AHI 98/hr  . Renal insufficiency   . Sleep apnea   . Stroke [facility]) 2000    Past Surgical History:  Procedure Laterality Date  . CORONARY ARTERY BYPASS GRAFT  2005   LIMA to LAD, SVG to OM1 and OM 2, RCA.   [name] left inguinal hernia repair    . LOOP RECORDER INSERTION N/A 01/03/2018   Procedure: LOOP RECORDER INSERTION;  Surgeon: Evans Lance, MD;  Location: [facility];  Service: Cardiovascular;  Laterality: N/A;  . TEE WITHOUT CARDIOVERSION N/A 12/11/2016   Procedure: TRANSESOPHAGEAL ECHOCARDIOGRAM (TEE);  Surgeon: [name], MD;  Location: [facility] ENDOSCOPY;  Service: Cardiovascular;  Laterality: N/A;  . TEE  WITHOUT CARDIOVERSION N/A 01/03/2018   Procedure: TRANSESOPHAGEAL ECHOCARDIOGRAM (TEE);  Surgeon: Dorothy Spark, MD;  Location: [facility];  Service: Cardiovascular;  Laterality: N/A;    Vitals:   04/20/18 1456  BP: (!) 157/87  Pulse: (!) 46    Subjective Assessment - 04/20/18 1452    Subjective  No new complaints. No falls or pain to report.     Limitations  Walking;House hold activities    Patient Stated Goals  "I would like to be able to walk outside better."     Currently in Pain?  No/denies          [facility] Adult PT Treatment/Exercise - 04/20/18 1453      Transfers   Transfers  Sit to Stand;Stand to Sit    Sit to Stand  6: Modified independent (Device/Increase time)    Stand to Sit  6: Modified independent (Device/Increase time)      Ambulation/Gait   Ambulation/Gait  Yes    Ambulation/Gait Assistance  5: Supervision    Ambulation/Gait Assistance Details  occasional foot/heel drag noted with gait, no loss of balance.     Ambulation Distance (Feet)  --   around gym with activities   Assistive device  None  Gait Pattern  Step-through pattern;Decreased arm swing - left;Decreased stance time - left;Decreased stride length;Decreased dorsiflexion - left;Decreased weight shift to left;Trendelenburg;Lateral hip instability;Trunk flexed;Wide base of support;Poor foot clearance - left    Ambulation Surface  Level;Indoor      Knee/Hip Exercises: Aerobic   Other Aerobic  Scifit level 2.0 UE/LE's for 5.5 minutes with goal >/= 65 rpm for strengthening and activity tolerance      Knee/Hip Exercises: Standing   Heel Raises  Both;1 set;10 reps;5 seconds;Limitations   plus toe raises for 5 sec's x 10 reps   Heel Raises Limitations  bil UE support with minimal heel raise noted    Other Standing Knee Exercises  side step with green resistance band below knees. cues to lift feet, not slide them along. performed 3 laps toward each side.           Balance Exercises - 04/20/18  1523      Balance Exercises: Standing   Rockerboard  Anterior/posterior;Lateral;EO;EC;30 seconds;Intermittent UE support    Other Standing Exercises  fwd stepping over bolsters of different heights next to counter with single UE support: 6 laps with min guard to min assist for balance. cues for incr step length and height to clear each surface      Balance Exercises: Standing   Rebounder Limitations  performed both ways on the balance board: rocking the board with emphasis on tall posture with EO, progressing to EC; holding the board steady for EC no head movements. min to mod assist for balance with no UE support. cues for pt to keep eyes closed, opens them quite often.            PT Short Term Goals - 04/05/18 1959      PT SHORT TERM GOAL #1   Title  Pt will initiate HEP in order to indicate improved functional mobility and balance.  (Target Date: 05/05/2018)    Time  4    Period  Weeks    Status  New    Target Date  05/05/18      PT SHORT TERM GOAL #2   Title  Pt will improve gait speed to >/=3.60 ft/sec in order to indicate improved efficiency of gait.     Time  4    Period  Weeks    Status  New      PT SHORT TERM GOAL #3   Title  Pt will improve 5TSS to </=11 secs without UE support with decreased BOS in order to indicate decreased fall risk.     Time  4    Period  Weeks    Status  New      PT SHORT TERM GOAL #4   Title  Pt will improve FGA to >/=21/30 in order to indicate decreased fall risk.     Time  4    Period  Weeks    Status  New      PT SHORT TERM GOAL #5   Title  Pt will perform 6MWT and improve distance by 150' in order to indicate improved functional endurance.     Time  4    Period  Weeks    Status  New      Additional Short Term Goals   Additional Short Term Goals  Yes      PT SHORT TERM GOAL #6   Title  Pt will ambulate up to 500' over unlevel paved outdoor surfaces w/o AD at S level in order to indicate  improved community mobility.     Time  4     Period  Weeks    Status  New        PT Long Term Goals - 04/05/18 2003      PT LONG TERM GOAL #1   Title  Pt will be independent with final HEP in order to indicate decreased fall risk and improved functional mobility.  (Target Date: 06/04/2018)    Time  8    Period  Weeks    Status  New    Target Date  06/04/18      PT LONG TERM GOAL #2   Title  Pt will improve FGA to >/=24/30 in order to indicate decreased fall risk.      Time  8    Period  Weeks    Status  New      PT LONG TERM GOAL #3   Title  Pt will improve 6MWT by 300' from baseline in order to indicate improved functional endurance.     Time  8    Period  Weeks    Status  New      PT LONG TERM GOAL #4   Title  Pt will ambulate up to 1000' over varying outdoor surfaces (grass, inclines/declines, curb/ramp) without AD at mod I level in order to indicate improved community mobility.      Time  8    Period  Weeks    Status  New      PT LONG TERM GOAL #5   Title  Pt will demonstrate ability to simulate bowling without overt LOB in order to return to leisure activity.     Time  8    Period  Weeks    Status  New            Plan - 04/20/18 1452    Clinical Impression Statement  Today's skilled session continued to focus on strengthening, activity tolerance and balance reactions. Pt noted to have decreased foot clearance with gait, tends to hit midfoot to heel with swing phase. Would benefit from contineud strengthening and stretching of LE's to further address this.     Rehab Potential  Good    Clinical Impairments Affecting Rehab Potential  Pt motivated to improve mobility    PT Frequency  2x / week    PT Duration  8 weeks    PT Treatment/Interventions  ADLs/Self Care Home Management;Aquatic Therapy;Electrical Stimulation;Gait training;Stair training;DME Instruction;Functional mobility training;Therapeutic activities;Therapeutic exercise;Balance training;Neuromuscular re-education;Patient/family education;Orthotic  Fit/Training;Passive range of motion;Vestibular    PT Next Visit Plan  work on improved L foot clearance wiht gait, outdoor gait  as weather permits, continue to work on strengthening and balance reactions.     Consulted and Agree with Plan of Care  Patient       Patient will benefit from skilled therapeutic intervention in order to improve the following deficits and impairments:  Abnormal gait, Decreased activity tolerance, Decreased balance, Decreased endurance, Decreased mobility, Decreased range of motion, Impaired perceived functional ability, Impaired flexibility, Impaired UE functional use, Postural dysfunction  Visit Diagnosis: Hemiplegia and hemiparesis following cerebral infarction affecting left non-dominant side (HCC)  Unsteadiness on feet  Other abnormalities of gait and mobility     Problem List Patient Active Problem List   Diagnosis Date Noted  . Abnormal peripheral vision, left 01/21/2018  . Anemia, chronic disease   . Left hemiparesis ([place])   . Acute ischemic cerebrovascular accident (CVA) involving right middle cerebral artery territory (  [place])   . CKD (chronic kidney disease), stage III ([place])   . Peripheral edema   . Stroke (cerebrum) ([place]) 01/05/2018  . History of gout   . AKI (acute kidney injury) ([place])   . Stage 3 chronic kidney disease ([place])   . Anemia of chronic disease   . Left-sided weakness   . Osteoarthritis   . OSA (obstructive sleep apnea)   . Noncompliance with CPAP treatment   . History of DVT (deep vein thrombosis)   . History of CVA (cerebrovascular accident)   . Dysphagia, post-stroke   . Sinus tachycardia   . Acute blood loss anemia   . Acute CVA (cerebrovascular accident) ([place]) 01/01/2018  . Hypertensive urgency 01/01/2018  . CKD (chronic kidney disease) stage 2, GFR 60-89 ml/min 01/01/2018  . 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 Generalized weakness   . Sepsis ([place]) 12/10/2016  . Bacteremia due to Streptococcus  12/10/2016  . Lactic acidosis 12/10/2016  . Elevated troponin 12/10/2016  . Renal insufficiency 12/10/2016  . Hypophosphatemia 12/10/2016  . Left leg swelling 12/10/2016  . Febrile illness 12/09/2016  . Febrile illness, acute   . OBESITY, UNSPECIFIED 05/31/2009  . Hyperlipidemia 05/30/2009  . Obstructive sleep apnea 05/30/2009  . Essential hypertension 05/30/2009  . CAD (coronary artery disease) 05/30/2009    Willow Ora, PTA, [facility] [address], [place], [place] [address] [phone] 04/21/18, 1:55 PM   Name: [name] MRN: [id number] Date of Birth: 05-Jul-1940

## 2018-04-22 ENCOUNTER — Encounter: Payer: Medicare Other | Attending: Registered Nurse

## 2018-04-22 ENCOUNTER — Ambulatory Visit: Payer: Medicare Other | Admitting: Physical Medicine & Rehabilitation

## 2018-04-22 DIAGNOSIS — G4733 Obstructive sleep apnea (adult) (pediatric): Secondary | ICD-10-CM | POA: Insufficient documentation

## 2018-04-22 DIAGNOSIS — I63511 Cerebral infarction due to unspecified occlusion or stenosis of right middle cerebral artery: Secondary | ICD-10-CM | POA: Insufficient documentation

## 2018-04-22 DIAGNOSIS — I252 Old myocardial infarction: Secondary | ICD-10-CM | POA: Insufficient documentation

## 2018-04-22 DIAGNOSIS — I2510 Atherosclerotic heart disease of native coronary artery without angina pectoris: Secondary | ICD-10-CM | POA: Insufficient documentation

## 2018-04-22 DIAGNOSIS — N183 Chronic kidney disease, stage 3 (moderate): Secondary | ICD-10-CM | POA: Insufficient documentation

## 2018-04-22 DIAGNOSIS — Z86718 Personal history of other venous thrombosis and embolism: Secondary | ICD-10-CM | POA: Insufficient documentation

## 2018-04-22 DIAGNOSIS — I69354 Hemiplegia and hemiparesis following cerebral infarction affecting left non-dominant side: Secondary | ICD-10-CM | POA: Insufficient documentation

## 2018-04-22 DIAGNOSIS — Z951 Presence of aortocoronary bypass graft: Secondary | ICD-10-CM | POA: Insufficient documentation

## 2018-04-22 DIAGNOSIS — E785 Hyperlipidemia, unspecified: Secondary | ICD-10-CM | POA: Insufficient documentation

## 2018-04-22 DIAGNOSIS — Z8249 Family history of ischemic heart disease and other diseases of the circulatory system: Secondary | ICD-10-CM | POA: Insufficient documentation

## 2018-04-22 DIAGNOSIS — Z87891 Personal history of nicotine dependence: Secondary | ICD-10-CM | POA: Insufficient documentation

## 2018-04-22 DIAGNOSIS — I129 Hypertensive chronic kidney disease with stage 1 through stage 4 chronic kidney disease, or unspecified chronic kidney disease: Secondary | ICD-10-CM | POA: Insufficient documentation

## 2018-04-25 NOTE — Progress Notes (Signed)
Carelink Summary Report / Loop Recorder 

## 2018-04-26 ENCOUNTER — Encounter: Payer: Self-pay | Admitting: Physical Therapy

## 2018-04-26 ENCOUNTER — Ambulatory Visit: Payer: Medicare Other | Admitting: Physical Therapy

## 2018-04-26 ENCOUNTER — Ambulatory Visit: Payer: Medicare Other | Admitting: Occupational Therapy

## 2018-04-26 VITALS — BP 131/82 | HR 68

## 2018-04-26 DIAGNOSIS — R2681 Unsteadiness on feet: Secondary | ICD-10-CM

## 2018-04-26 DIAGNOSIS — I69354 Hemiplegia and hemiparesis following cerebral infarction affecting left non-dominant side: Secondary | ICD-10-CM

## 2018-04-26 DIAGNOSIS — R2689 Other abnormalities of gait and mobility: Secondary | ICD-10-CM

## 2018-04-26 DIAGNOSIS — M25512 Pain in left shoulder: Secondary | ICD-10-CM

## 2018-04-26 DIAGNOSIS — M25612 Stiffness of left shoulder, not elsewhere classified: Secondary | ICD-10-CM

## 2018-04-26 NOTE — Therapy (Signed)
[facility] [address] [place], [place], [address] Phone: [phone]   Fax:  [phone]  Occupational Therapy Treatment  Patient Details  Name: [name] MRN: [id number] Date of Birth: [birth year] Referring Provider (OT): Dr. Alysia Penna   Encounter Date: 04/26/2018  OT End of Session - 04/26/18 1258    Visit Number  4    Number of Visits  25    Date for OT Re-Evaluation  07/04/18    Authorization Type  [employer] Medicare     Authorization - Visit Number  4    Authorization - Number of Visits  10    OT Start Time  0930    OT Stop Time  1015    OT Time Calculation (min)  45 min    Activity Tolerance  Patient tolerated treatment well    Behavior During Therapy  [facility] for tasks assessed/performed       Past Medical History:  Diagnosis Date  . CAD (coronary artery disease)    s/p cath in 2005 and CABG x4  . Cataract   . DJD (degenerative joint disease)   . DJD (degenerative joint disease)   . Fluid retention    mild  . H/O: gout   . History of blood clots   . HTN (hypertension)   . Hyperlipidemia   . Hyperlipidemia   . Myocardial infarction ([place]) 2009  . Obesity    with reduction  . OSA (obstructive sleep apnea)    AHI 98/hr  . Renal insufficiency   . Sleep apnea   . Stroke [facility]) 2000    Past Surgical History:  Procedure Laterality Date  . CORONARY ARTERY BYPASS GRAFT  2005   LIMA to LAD, SVG to OM1 and OM 2, RCA.   [name] left inguinal hernia repair    . LOOP RECORDER INSERTION N/A 01/03/2018   Procedure: LOOP RECORDER INSERTION;  Surgeon: Evans Lance, MD;  Location: [place];  Service: Cardiovascular;  Laterality: N/A;  . TEE WITHOUT CARDIOVERSION N/A 12/11/2016   Procedure: TRANSESOPHAGEAL ECHOCARDIOGRAM (TEE);  Surgeon: [name], MD;  Location: [facility] ENDOSCOPY;  Service: Cardiovascular;  Laterality: N/A;  . TEE WITHOUT CARDIOVERSION N/A 01/03/2018   Procedure: TRANSESOPHAGEAL  ECHOCARDIOGRAM (TEE);  Surgeon: Dorothy Spark, MD;  Location: [facility] ENDOSCOPY;  Service: Cardiovascular;  Laterality: N/A;    There were no vitals filed for this visit.  Subjective Assessment - 04/26/18 0935    Pertinent History  Hemiparesis affecting L side due to CVA.  PMH:  CAD, s/p CABGx4 2005, MI, HTN, dylipidemia, gout, arrhythmia, OSA, DJD, loop recorder    Patient Stated Goals  improve grip/use of L hand, to be able to grip steering wheel and close door in order to drive again    Currently in Pain?  No/denies       Supine: chest press motion w/ PVC frame w/ cues to go slow to prevent compensations and use control. Progressed to BUE sh flex/ext using PVC frame w/ cues for Lt elbow extension. Therapist performed P/ROM LUE but limited past 90* and reports of pain d/t decr. Scapula movement.  Sidelying: high level AA/ROM LUE in gravity elim plane w/ facilitation/mobs to Lt scapula for downward depression and upward rotation.  Seated: trunk extension w/ wt bearing through BUE's for downward press to facilitate downward depression of scapulae, trunk extension, anterior pelvic tilt. Progressed to LUE wt bearing w/ body on arm movements on extended arm and bent arm (  over elbow). Closed chain mid range sh flexion w/ cues to lead w/ hand, not shoulder                      OT Short Term Goals - 04/05/18 1354      OT SHORT TERM GOAL #1   Title  Pt will be independent with initial HEP.--check STGs 05/17/18    Time  6    Period  Weeks    Status  New      OT SHORT TERM GOAL #2   Title  Pt will demo at least 110* PROM L shoulder flex with no pain to assist with functional reach.    Time  6    Period  Weeks    Status  New      OT SHORT TERM GOAL #3   Title  Pt will be able to use L hand to assist with tying shoes, pulling up pants/socks.    Time  6    Period  Weeks    Status  New      OT SHORT TERM GOAL #4   Title  Pt will demo at least 80* L shoulder flex AROM with  min compensation to be able to retrieve light object.    Time  6    Period  Weeks    Status  New      OT SHORT TERM GOAL #5   Title  Pt will demo at least 90% gross composite finger flex to assist with grasping objects with L hand.    Time  6    Period  Weeks    Status  New      OT SHORT TERM GOAL #6   Title  Pt will perform simple home maintenance task mod I.    Time  6    Period  Weeks    Status  New        OT Long Term Goals - 04/05/18 1358      OT LONG TERM GOAL #1   Title  Pt will be independent with updated HEP.--check LTGs 07/04/18    Time  12    Period  Weeks    Status  New      OT LONG TERM GOAL #2   Title  Pt will demo at least 20lbs L grip strength to assist in opening jars/containers.    Time  12    Period  Weeks    Status  New      OT LONG TERM GOAL #3   Title  Pt will perform simple cooking task with supervision.    Time  12    Period  Weeks    Status  New      OT LONG TERM GOAL #4   Title  Pt will improve functional reaching/coordination as shown by improving score on box and blocks test by at least 10    Baseline  L-16 blocks    Time  6    Period  Weeks    Status  New      OT LONG TERM GOAL #5   Title  Pt will demo improved L hand coordination as shown by completing 9-hole peg test in less than 2 min.    Baseline  unable    Time  6    Period  Weeks    Status  New      OT LONG TERM GOAL #6   Title  Pt will demo  at least 90* L shoulder flex to retrieve lightweight object with no pain.    Time  6    Period  Weeks    Status  New            Plan - 04/26/18 1258    Clinical Impression Statement  Pt tends to lead movement w/ shoulder. Pt also very stiff/decr flexibility in trunk and pelvis. Pt improves shoulder movement w/ tactile faciltiation and simple v.c's or demo cues    Occupational Profile and client history currently impacting functional performance  Pt was independent prior to CVA.  Pt is now performing BADLs mod I with difficulty  using LUE.  Pt is not currently performing previous IADLs like driving, cooking, home maintenance tasks or leisure tasks such as bowling.    Occupational performance deficits (Please refer to evaluation for details):  ADL's;IADL's;Leisure;Social Participation    Rehab Potential  Good    OT Frequency  2x / week    OT Duration  12 weeks    OT Treatment/Interventions  Self-care/ADL training;Electrical Stimulation;Therapeutic exercise;Patient/family education;Splinting;Neuromuscular education;Paraffin;Moist Heat;Aquatic Therapy;Fluidtherapy;Therapist, nutritional;Therapeutic activities;Balance training;Passive range of motion;Manual Therapy;DME and/or AE instruction;Ultrasound;Cryotherapy;Contrast [employer]  continue with neuro re-ed, LUE functional use    Consulted and Agree with Plan of Care  Patient       Patient will benefit from skilled therapeutic intervention in order to improve the following deficits and impairments:  Decreased cognition, Pain, Decreased knowledge of use of DME, Decreased coordination, Decreased mobility, Decreased strength, Decreased range of motion, Decreased endurance, Decreased activity tolerance, Decreased balance, Impaired UE functional use  Visit Diagnosis: Hemiplegia and hemiparesis following cerebral infarction affecting left non-dominant side (HCC)  Stiffness of left shoulder, not elsewhere classified  Left shoulder pain, unspecified chronicity    Problem List Patient Active Problem List   Diagnosis Date Noted  . Abnormal peripheral vision, left 01/21/2018  . Anemia, chronic disease   . Left hemiparesis ([place])   . Acute ischemic cerebrovascular accident (CVA) involving right middle cerebral artery territory [facility])   . CKD (chronic kidney disease), stage III ([place])   . Peripheral edema   . Stroke (cerebrum) ([place]) 01/05/2018  . History of gout   . AKI (acute kidney injury) ([place])   . Stage 3 chronic kidney disease ([place])   . Anemia of chronic  disease   . Left-sided weakness   . Osteoarthritis   . OSA (obstructive sleep apnea)   . Noncompliance with CPAP treatment   . History of DVT (deep vein thrombosis)   . History of CVA (cerebrovascular accident)   . Dysphagia, post-stroke   . Sinus tachycardia   . Acute blood loss anemia   . Acute CVA (cerebrovascular accident) ([place]) 01/01/2018  . Hypertensive urgency 01/01/2018  . CKD (chronic kidney disease) stage 2, GFR 60-89 ml/min 01/01/2018  . 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 Generalized weakness   . Sepsis ([place]) 12/10/2016  . Bacteremia due to Streptococcus 12/10/2016  . Lactic acidosis 12/10/2016  . Elevated troponin 12/10/2016  . Renal insufficiency 12/10/2016  . Hypophosphatemia 12/10/2016  . Left leg swelling 12/10/2016  . Febrile illness 12/09/2016  . Febrile illness, acute   . OBESITY, UNSPECIFIED 05/31/2009  . Hyperlipidemia 05/30/2009  . Obstructive sleep apnea 05/30/2009  . Essential hypertension 05/30/2009  . CAD (coronary artery disease) 05/30/2009    Carey Bullocks, OTR/L 04/26/2018, 1:01 PM  [facility] [address] [place], [place], [address] Phone:  [phone]   Fax:  [phone]  Name: [name] MRN: [id number] Date of Birth: [birth year]

## 2018-04-26 NOTE — Therapy (Signed)
[facility] [address] [place], [place], [address] Phone: [phone]   Fax:  [phone]  Physical Therapy Treatment  Patient Details  Name: [name] MRN: [id number] Date of Birth: February 19, 1941 Referring Provider (PT): Alysia Penna, MD   Encounter Date: 04/26/2018  PT End of Session - 04/26/18 1653    Visit Number  4    Number of Visits  17    Date for PT Re-Evaluation  07/04/18   POC written for 90 days, plan to see for 60 days   Authorization Type  Medicare-Needs 10th visit progress note    PT Start Time  1020    PT Stop Time  1101    PT Time Calculation (min)  41 min    Equipment Utilized During Treatment  Gait belt    Activity Tolerance  Patient tolerated treatment well;No increased pain    Behavior During Therapy  WFL for tasks assessed/performed       Past Medical History:  Diagnosis Date  . CAD (coronary artery disease)    s/p cath in 2005 and CABG x4  . Cataract   . DJD (degenerative joint disease)   . DJD (degenerative joint disease)   . Fluid retention    mild  . H/O: gout   . History of blood clots   . HTN (hypertension)   . Hyperlipidemia   . Hyperlipidemia   . Myocardial infarction ([place]) 2009  . Obesity    with reduction  . OSA (obstructive sleep apnea)    AHI 98/hr  . Renal insufficiency   . Sleep apnea   . Stroke [facility]) 2000    Past Surgical History:  Procedure Laterality Date  . CORONARY ARTERY BYPASS GRAFT  2005   LIMA to LAD, SVG to OM1 and OM 2, RCA.   [name] left inguinal hernia repair    . LOOP RECORDER INSERTION N/A 01/03/2018   Procedure: LOOP RECORDER INSERTION;  Surgeon: Evans Lance, MD;  Location: [facility];  Service: Cardiovascular;  Laterality: N/A;  . TEE WITHOUT CARDIOVERSION N/A 12/11/2016   Procedure: TRANSESOPHAGEAL ECHOCARDIOGRAM (TEE);  Surgeon: [name], MD;  Location: [facility] ENDOSCOPY;  Service: Cardiovascular;  Laterality: N/A;  . TEE  WITHOUT CARDIOVERSION N/A 01/03/2018   Procedure: TRANSESOPHAGEAL ECHOCARDIOGRAM (TEE);  Surgeon: Dorothy Spark, MD;  Location: [facility] ENDOSCOPY;  Service: Cardiovascular;  Laterality: N/A;    Vitals:   04/26/18 1057  BP: 131/82  Pulse: 68    Subjective Assessment - 04/26/18 1023    Subjective  Denies falls or changes.  Feels like his balance is getting better and he is clearing L foot better due to increase awareness.  Pt asking about bands for UE exercises.  States he was using them with his [facility] therapist.  Deferred pt to discuss with OT.    Pertinent History  CAD s/p CABG 2005, HTN, HLD, OSA, DVTs    Limitations  Walking;House hold activities    Patient Stated Goals  "I would like to be able to walk outside better."     Currently in Pain?  No/denies         [facility] Adult PT Treatment/Exercise - 04/26/18 0001      Transfers   Transfers  Sit to Stand;Stand to Sit    Sit to Stand  6: Modified independent (Device/Increase time)    Stand to Sit  6: Modified independent (Device/Increase time)      Ambulation/Gait   Ambulation/Gait  Yes  Ambulation/Gait Assistance  5: Supervision    Ambulation Distance (Feet)  110 Feet   x 4 and during activities in gym   Assistive device  None    Gait Pattern  Step-through pattern;Decreased arm swing - left;Decreased stance time - left;Decreased stride length;Decreased dorsiflexion - left;Decreased weight shift to left;Trendelenburg;Lateral hip instability;Trunk flexed;Wide base of support;Poor foot clearance - left    Ambulation Surface  Level;Indoor      Posture/Postural Control   Posture/Postural Control  Postural limitations    Postural Limitations  Forward head;Rounded Shoulders      Exercises   Exercises  Ankle;Knee/Hip      Knee/Hip Exercises: Standing   Knee Flexion  Both;2 sets;15 reps;Other (comment)   4# weight   Hip Flexion  Stengthening;Both;2 sets;15 reps;Knee bent;Other (comment)   4# weight   Hip Abduction   Stengthening;Both;2 sets;15 reps;Knee straight;Other (comment)   4# weight   Other Standing Knee Exercises  side stepping with 4# weight bil LE in // bars x 6'x8 reps      Knee/Hip Exercises: Supine   Bridges  Both;2 sets;10 reps    Single Leg Bridge  Left;2 sets;10 reps    Other Supine Knee/Hip Exercises  Hooklying hip abduction LLE with red theraband x 15 reps x 2.  cues for technique      Ankle Exercises: Standing   Heel Raises  Both;15 reps;1 second      Ankle Exercises: Seated   Toe Raise  15 reps;2 seconds;Other (comment)   with blue theraband for resistance x 2 sets      PT Education - 04/26/18 1651    Education Details  Heel strike with gait, follow up with OT regarding resistance bands for UE exercises    Person(s) Educated  Patient    Methods  Explanation;Demonstration    Comprehension  Verbalized understanding       PT Short Term Goals - 04/05/18 1959      PT SHORT TERM GOAL #1   Title  Pt will initiate HEP in order to indicate improved functional mobility and balance.  (Target Date: 05/05/2018)    Time  4    Period  Weeks    Status  New    Target Date  05/05/18      PT SHORT TERM GOAL #2   Title  Pt will improve gait speed to >/=3.60 ft/sec in order to indicate improved efficiency of gait.     Time  4    Period  Weeks    Status  New      PT SHORT TERM GOAL #3   Title  Pt will improve 5TSS to </=11 secs without UE support with decreased BOS in order to indicate decreased fall risk.     Time  4    Period  Weeks    Status  New      PT SHORT TERM GOAL #4   Title  Pt will improve FGA to >/=21/30 in order to indicate decreased fall risk.     Time  4    Period  Weeks    Status  New      PT SHORT TERM GOAL #5   Title  Pt will perform 6MWT and improve distance by 150' in order to indicate improved functional endurance.     Time  4    Period  Weeks    Status  New      Additional Short Term Goals   Additional Short Term Goals  Yes  PT SHORT TERM GOAL  #6   Title  Pt will ambulate up to 500' over unlevel paved outdoor surfaces w/o AD at S level in order to indicate improved community mobility.     Time  4    Period  Weeks    Status  New        PT Long Term Goals - 04/05/18 2003      PT LONG TERM GOAL #1   Title  Pt will be independent with final HEP in order to indicate decreased fall risk and improved functional mobility.  (Target Date: 06/04/2018)    Time  8    Period  Weeks    Status  New    Target Date  06/04/18      PT LONG TERM GOAL #2   Title  Pt will improve FGA to >/=24/30 in order to indicate decreased fall risk.      Time  8    Period  Weeks    Status  New      PT LONG TERM GOAL #3   Title  Pt will improve 6MWT by 300' from baseline in order to indicate improved functional endurance.     Time  8    Period  Weeks    Status  New      PT LONG TERM GOAL #4   Title  Pt will ambulate up to 1000' over varying outdoor surfaces (grass, inclines/declines, curb/ramp) without AD at mod I level in order to indicate improved community mobility.      Time  8    Period  Weeks    Status  New      PT LONG TERM GOAL #5   Title  Pt will demonstrate ability to simulate bowling without overt LOB in order to return to leisure activity.     Time  8    Period  Weeks    Status  New            Plan - 04/26/18 1653    Clinical Impression Statement  Skilled PT session focused on gait and LE strengthening.  Pt continues with decreased foot clearance on L with gait as well as decreased hip abduction.  Pt appears motivated to improve mobility and asking about additional exercises for home.  Continue PT per POC.    Rehab Potential  Good    Clinical Impairments Affecting Rehab Potential  Pt motivated to improve mobility    PT Frequency  2x / week    PT Duration  8 weeks    PT Treatment/Interventions  ADLs/Self Care Home Management;Aquatic Therapy;Electrical Stimulation;Gait training;Stair training;DME Instruction;Functional mobility  training;Therapeutic activities;Therapeutic exercise;Balance training;Neuromuscular re-education;Patient/family education;Orthotic Fit/Training;Passive range of motion;Vestibular    PT Next Visit Plan  work on improved L foot clearance wiht gait, outdoor gait  as weather permits, continue to work on strengthening and balance reactions.     Consulted and Agree with Plan of Care  Patient       Patient will benefit from skilled therapeutic intervention in order to improve the following deficits and impairments:  Abnormal gait, Decreased activity tolerance, Decreased balance, Decreased endurance, Decreased mobility, Decreased range of motion, Impaired perceived functional ability, Impaired flexibility, Impaired UE functional use, Postural dysfunction  Visit Diagnosis: Hemiplegia and hemiparesis following cerebral infarction affecting left non-dominant side (HCC)  Unsteadiness on feet  Other abnormalities of gait and mobility     Problem List Patient Active Problem List   Diagnosis Date Noted  . Abnormal  peripheral vision, left 01/21/2018  . Anemia, chronic disease   . Left hemiparesis ([place])   . 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 CKD (chronic kidney disease), stage III ([place])   . Peripheral edema   . Stroke (cerebrum) ([place]) 01/05/2018  . History of gout   . AKI (acute kidney injury) ([place])   . Stage 3 chronic kidney disease ([place])   . Anemia of chronic disease   . Left-sided weakness   . Osteoarthritis   . OSA (obstructive sleep apnea)   . Noncompliance with CPAP treatment   . History of DVT (deep vein thrombosis)   . History of CVA (cerebrovascular accident)   . Dysphagia, post-stroke   . Sinus tachycardia   . Acute blood loss anemia   . Acute CVA (cerebrovascular accident) ([place]) 01/01/2018  . Hypertensive urgency 01/01/2018  . CKD (chronic kidney disease) stage 2, GFR 60-89 ml/min 01/01/2018  . Sensorineural  hearing loss (SNHL), 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  . Generalized weakness   . Sepsis ([place]) 12/10/2016  . Bacteremia due to Streptococcus 12/10/2016  . Lactic acidosis 12/10/2016  . Elevated troponin 12/10/2016  . Renal insufficiency 12/10/2016  . Hypophosphatemia 12/10/2016  . Left leg swelling 12/10/2016  . Febrile illness 12/09/2016  . Febrile illness, acute   . OBESITY, UNSPECIFIED 05/31/2009  . Hyperlipidemia 05/30/2009  . Obstructive sleep apnea 05/30/2009  . Essential hypertension 05/30/2009  . CAD (coronary artery disease) 05/30/2009    Narda Bonds, PTA [place] 04/26/18 4:57 PM Phone: [phone] Fax: [facility] [address] [place], [place], [address] Phone: [phone]   Fax:  [phone]  Name: [name] MRN: [id number] Date of Birth: [birth year]

## 2018-04-27 ENCOUNTER — Ambulatory Visit: Payer: Medicare Other | Admitting: Neurology

## 2018-04-27 ENCOUNTER — Encounter: Payer: Self-pay | Admitting: Neurology

## 2018-04-27 VITALS — BP 178/100 | HR 69 | Ht 73.0 in | Wt 268.0 lb

## 2018-04-27 DIAGNOSIS — G4733 Obstructive sleep apnea (adult) (pediatric): Secondary | ICD-10-CM | POA: Diagnosis not present

## 2018-04-27 DIAGNOSIS — Z951 Presence of aortocoronary bypass graft: Secondary | ICD-10-CM | POA: Diagnosis not present

## 2018-04-27 DIAGNOSIS — Z8673 Personal history of transient ischemic attack (TIA), and cerebral infarction without residual deficits: Secondary | ICD-10-CM | POA: Diagnosis not present

## 2018-04-27 DIAGNOSIS — E669 Obesity, unspecified: Secondary | ICD-10-CM | POA: Diagnosis not present

## 2018-04-27 DIAGNOSIS — G4719 Other hypersomnia: Secondary | ICD-10-CM

## 2018-04-27 DIAGNOSIS — R351 Nocturia: Secondary | ICD-10-CM

## 2018-04-27 NOTE — Progress Notes (Signed)
Subjective:    Patient ID: [name] is a 78 y.o. male.  HPI      Star Age, MD, PhD [facility] [address], [address] P.O. [place], [place] [address]  Dear [name],   I saw your patient, [name], upon your kind request in my sleep clinic today for initial consultation of his sleep disorder, in particular, concern for underlying obstructive sleep apnea. The patient is accompanied by his wife today. As you know, Mr. 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 He has not been on CPAP therapy in years, but used CPAP in the past. He has an old CPAP machine at home. Prior sleep study results are not available for my review today. A CPAP download is not available either. I reviewed your office note from 03/15/2018. His Epworth sleepiness score is 13 out of 24 today, fatigue score is 10 out of 63. He lives with his wife, they have 3 children. He does not smoke or drink alcohol, he does not utilize caffeine on a regular basis. He is in outpt PT still, also OT.  His BT is around 10 PM, and rise time around 10 or 11 AM. He has nocturia about 3 times per night. He has been a long sleeper for as long as he can remember. 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 He has lost about 30 lb since November. His main complaint about CPAP in the past was his sleep interruption, having to go to the bathroom and he found CPAP disruptive and cumbersome to use, he used a fullface mask in the past. He would be willing to get retested and consider CPAP therapy again. His brother had sleep apnea. He had a tonsillectomy as a teenager. He tends  to watch TV in the bedroom, TV can stay on all night at times. No pets in the household.  His Past Medical History Is Significant For: Past Medical History:  Diagnosis Date  . CAD (coronary artery disease)    s/p cath in 2005 and CABG x4  . Cataract   . DJD (degenerative joint disease)   . DJD (degenerative joint disease)   . Fluid retention    mild  . H/O: gout   . History of blood clots   . HTN (hypertension)   . Hyperlipidemia   . Hyperlipidemia   . Myocardial infarction ([place]) 2009  . Obesity    with reduction  . OSA (obstructive sleep apnea)    AHI 98/hr  . Renal insufficiency   . Sleep apnea   . Stroke [facility] 2000    His Past Surgical History Is Significant For: Past Surgical History:  Procedure Laterality Date  . CORONARY ARTERY BYPASS GRAFT  2005   LIMA to LAD, SVG to OM1 and OM 2, RCA.   [name] left inguinal hernia repair    . LOOP RECORDER INSERTION N/A 01/03/2018   Procedure: LOOP RECORDER INSERTION;  Surgeon: Evans Lance, MD;  Location: [place] [facility];  Service: Cardiovascular;  Laterality: N/A;  . TEE WITHOUT CARDIOVERSION N/A 12/11/2016   Procedure: TRANSESOPHAGEAL ECHOCARDIOGRAM (TEE);  Surgeon: [name], MD;  Location: [place];  Service: Cardiovascular;  Laterality: N/A;  .  TEE WITHOUT CARDIOVERSION N/A 01/03/2018   Procedure: TRANSESOPHAGEAL ECHOCARDIOGRAM (TEE);  Surgeon: Dorothy Spark, MD;  Location: [facility];  Service: Cardiovascular;  Laterality: N/A;    His Family History Is Significant For: Family History  Problem Relation Age of Onset  . Gout Mother   . Stroke Mother   . Coronary artery disease Mother   . Hypertension Mother   . Arthritis Mother   . Coronary artery disease Father   . Arthritis Father   . Gout Father   . Stroke Father   . Hypertension Father   . Coronary artery disease Brother   . Arthritis Brother   . Gout Brother   . Stroke Brother   . Hypertension Brother   . Coronary artery disease Brother   .  Arthritis Brother   . Gout Brother   . Stroke Brother   . Hypertension Brother   . Coronary artery disease Other        significant in multiple family members    His Social History Is Significant For: Social History   Socioeconomic History  . Marital status: Married    Spouse name: [name]  . Number of children: Not on file  . Years of education: Not on file  . Highest education level: Not on file  Occupational History  . Not on file  Social Needs  . Financial resource strain: Not on file  . Food insecurity:    Worry: Not on file    Inability: Not on file  . Transportation needs:    Medical: Not on file    Non-medical: Not on file  Tobacco Use  . Smoking status: Former Smoker    Types: Cigars  . Smokeless tobacco: Never Used  . Tobacco comment: used to smoke cigars, 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, none since  Substance and Sexual Activity  . Alcohol use: No  . Drug use: No  . Sexual activity: Not on file  Lifestyle  . Physical activity:    Days per week: Not on file    Minutes per session: Not on file  . Stress: Not on file  Relationships  . Social connections:    Talks on phone: Not on file    Gets together: Not on file    Attends religious service: Not on file    Active member of club or organization: Not on file    Attends meetings of clubs or organizations: Not on file    Relationship status: Not on file  Other Topics Concern  . Not on file  Social History Narrative  . Not on file    His Allergies Are:  Allergies  Allergen Reactions  . Ramipril Cough  :   His Current Medications Are:  Outpatient Encounter Medications as of 04/27/2018  Medication Sig  . acetaminophen (TYLENOL) 325 MG tablet Take 1-2 tablets (325-650 mg total) by mouth every 4 (four) hours as needed for mild pain.  [name] allopurinol (ZYLOPRIM) 100 MG tablet Take 200 mg by mouth daily.   [name] amLODipine (NORVASC) 5 MG tablet TAKE 1 TABLET BY MOUTH ONCE DAILY  . apixaban (ELIQUIS) 5 MG TABS  tablet Take 1 tablet (5 mg total) by mouth 2 (two) times daily.  . furosemide (LASIX) 20 MG tablet TAKE 1/2 (ONE-HALF) TABLET BY MOUTH ONCE DAILY  . Menthol-Methyl Salicylate (MUSCLE RUB) 10-15 % CREA Apply 1 application topically 4 (four) times daily -  with meals and at bedtime. To neck and shoulders  . metoprolol tartrate (  LOPRESSOR) 25 MG tablet TAKE ONE TABLET BY MOUTH TWICE DAILY (Patient taking differently: Take 25 mg by mouth 2 (two) times daily. )  . montelukast (SINGULAIR) 10 MG tablet Take 10 mg by mouth at bedtime.  . niacin (NIASPAN) 500 MG CR tablet Take 500 mg by mouth at bedtime.    . pantoprazole (PROTONIX) 40 MG tablet Take 1 tablet (40 mg total) by mouth daily.  . polyethylene glycol (MIRALAX / GLYCOLAX) packet Take 17 g by mouth daily.  . rosuvastatin (CRESTOR) 20 MG tablet Take 1 tablet (20 mg total) by mouth daily at 6 PM.  . tamsulosin (FLOMAX) 0.4 MG CAPS capsule Take 1 capsule (0.4 mg total) by mouth daily.   No facility-administered encounter medications on file as of 04/27/2018.   :  Review of Systems:  Out of a complete 14 point review of systems, all are reviewed and negative with the exception of these symptoms as listed below: Review of Systems  Neurological:       Pt presents today to discuss his sleep. Pt has had a sleep study and used a cpap in the past. He no longer uses cpap. The unused cpap at home is outdated. Pt does endorse snoring.  Epworth Sleepiness Scale 0= would never doze 1= slight chance of dozing 2= moderate chance of dozing 3= high chance of dozing  Sitting and reading: 2 Watching TV: 1 Sitting inactive in a public place (ex. Theater or meeting): 1 As a passenger in a car for an hour without a break: 1 Lying down to rest in the afternoon: 1 Sitting and talking to someone: 3 Sitting quietly after lunch (no alcohol): 1 In a car, while stopped in traffic: 3 Total: 13     Objective:  Neurological Exam  Physical Exam Physical  Examination:   Vitals:   04/27/18 1324  BP: (!) 178/100  Pulse: 69   Of note, he did not take his blood pressure medication today. No acute distress, no symptoms.   General Examination: The patient is a very pleasant 78 y.o. male in no acute distress. He appears well-developed and well-nourished and well groomed.   HEENT: Normocephalic, atraumatic, pupils are equal, round and reactive to light. Extraocular tracking is good without limitation to gaze excursion or nystagmus noted. Normal smooth pursuit is noted. Hearing is grossly intact. Face is slightly asymmetric with mild left lower facial weakness noted. No significant dysarthria. Mild hypophonia. No lip, neck or jaw tremor, no carotid bruits. Oropharynx exam reveals: moderate mouth dryness, adequate dental hygiene With full dentures in place and moderate airway crowding secondary to redundant soft palate and wider tongue. Mallampati is class III. Neck circumference is 16-7/8 inches. Tongue protrudes centrally and palate elevates symmetrically.  Chest: Clear to auscultation without wheezing, rhonchi or crackles noted.  Heart: S1+S2+0, regular and normal without murmurs, rubs or gallops noted.   Abdomen: Soft, non-tender and non-distended with normal bowel sounds appreciated on auscultation.  Extremities: There is trace pitting edema in the distal lower extremities bilaterally, around the ankles. Missing L fifth finger from remote injury in the distant past.   Skin: Warm and dry without trophic changes noted.   Musculoskeletal: exam reveals no obvious joint deformities, tenderness or joint swelling or erythema.   Neurologically:  Mental status: The patient is awake, alert and oriented in all 4 spheres. His immediate and remote memory, attention, language skills and fund of knowledge are appropriate. There is no evidence of aphasia, agnosia, apraxia or anomia. Speech is  clear with normal prosody and enunciation. Thought process is linear.  Mood is normal and affect is normal.  Cranial nerves II - XII are as described above under HEENT exam. In addition: shoulder shrug is normal with equal shoulder height noted. Motor exam: Normal bulk, strength and tone is noted on the right, he has 3 out of 5 grip strength weakness in the left upper extremity, otherwise strength in the left upper and lower extremity is 4+ out of 5. There is no tremor. Reflexes are 1+ in the right upper extremity, 2-3+ in the left upper extremity, trace in both knees and absent in the ankles. throughout. Fine motor skills and coordination: mildly impaired in the left upper extremity, otherwise grossly intact. Cerebellar testing: No dysmetria or intention tremor. There is no truncal or gait ataxia.  Sensory exam: intact to light touch in the upper and lower extremities.  Gait, station and balance: He stands easily. No veering to one side is noted. No leaning to one side is noted. Posture is age-appropriate and stance is narrow based. Gait shows normal stride length and normal pace. No problems turning are noted.   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 He would benefit from reevaluation and consideration of CPAP therapy, especially in light of his cardiac history and stroke diagnosis. I had a long chat with the patient and his wife about my findings and the diagnosis of OSA, its prognosis and treatment options. We talked about medical treatments, surgical interventions and non-pharmacological approaches. I explained in particular the risks and ramifications of  untreated moderate to severe OSA, especially with respect to developing cardiovascular disease down the Road, including congestive heart failure, difficult to treat hypertension, cardiac arrhythmias, or stroke. Even type 2 diabetes has, in part, been linked to untreated OSA. Symptoms of untreated OSA include daytime sleepiness, memory problems, mood irritability and mood disorder such as depression and anxiety, lack of energy, as well as recurrent headaches, especially morning headaches. We talked about trying to maintain a healthy lifestyle in general, as well as the importance of weight control. I encouraged the patient to eat healthy, exercise daily and keep well hydrated, to keep a scheduled bedtime and wake time routine, to not skip any meals and eat healthy snacks in between meals. I advised the patient not to drive when feeling sleepy. I recommended the following at this time: sleep study with potential positive airway pressure titration. (We will score hypopneas at 4%).   I explained the sleep test procedure to the patient and also outlined possible surgical and non-surgical treatment options of OSA, including the use of a custom-made dental device (which would require a referral to a specialist dentist or oral surgeon), upper airway surgical options, such as pillar implants, radiofrequency surgery, tongue base surgery, and UPPP (which would involve a referral to an ENT surgeon). Rarely, jaw surgery such as mandibular advancement may be considered.  I also explained the CPAP treatment option to the patient, who indicated that he would be willing to try CPAP if the need arises. I explained the importance of being compliant with PAP treatment, not only for insurance  purposes but primarily to improve His symptoms, and for the patient's long term health benefit, including to reduce His cardiovascular risks. I answered all their questions today and the patient and his wife were in agreement. I plan to see  him back after the sleep study is completed and encouraged them to call with any interim questions, concerns, problems or updates.   Thank you very much for allowing me to participate in the care of this nice patient. If I can be of any further assistance to you please do not hesitate to talk to me.  Sincerely,   Star Age, MD, PhD

## 2018-04-27 NOTE — Patient Instructions (Signed)

## 2018-05-02 ENCOUNTER — Encounter: Payer: Self-pay | Admitting: Physical Medicine & Rehabilitation

## 2018-05-02 ENCOUNTER — Ambulatory Visit: Payer: Medicare Other | Admitting: Physical Medicine & Rehabilitation

## 2018-05-02 ENCOUNTER — Encounter: Payer: Medicare Other | Attending: Registered Nurse

## 2018-05-02 ENCOUNTER — Other Ambulatory Visit: Payer: Self-pay

## 2018-05-02 VITALS — BP 151/87 | HR 71 | Ht 73.0 in | Wt 270.0 lb

## 2018-05-02 DIAGNOSIS — G4733 Obstructive sleep apnea (adult) (pediatric): Secondary | ICD-10-CM | POA: Insufficient documentation

## 2018-05-02 DIAGNOSIS — Z8249 Family history of ischemic heart disease and other diseases of the circulatory system: Secondary | ICD-10-CM | POA: Diagnosis not present

## 2018-05-02 DIAGNOSIS — I69354 Hemiplegia and hemiparesis following cerebral infarction affecting left non-dominant side: Secondary | ICD-10-CM | POA: Diagnosis present

## 2018-05-02 DIAGNOSIS — Z951 Presence of aortocoronary bypass graft: Secondary | ICD-10-CM | POA: Diagnosis not present

## 2018-05-02 DIAGNOSIS — I129 Hypertensive chronic kidney disease with stage 1 through stage 4 chronic kidney disease, or unspecified chronic kidney disease: Secondary | ICD-10-CM | POA: Insufficient documentation

## 2018-05-02 DIAGNOSIS — Z86718 Personal history of other venous thrombosis and embolism: Secondary | ICD-10-CM | POA: Diagnosis not present

## 2018-05-02 DIAGNOSIS — Z87891 Personal history of nicotine dependence: Secondary | ICD-10-CM | POA: Insufficient documentation

## 2018-05-02 DIAGNOSIS — N183 Chronic kidney disease, stage 3 (moderate): Secondary | ICD-10-CM | POA: Insufficient documentation

## 2018-05-02 DIAGNOSIS — I63511 Cerebral infarction due to unspecified occlusion or stenosis of right middle cerebral artery: Secondary | ICD-10-CM | POA: Diagnosis not present

## 2018-05-02 DIAGNOSIS — I2510 Atherosclerotic heart disease of native coronary artery without angina pectoris: Secondary | ICD-10-CM | POA: Diagnosis not present

## 2018-05-02 DIAGNOSIS — I252 Old myocardial infarction: Secondary | ICD-10-CM | POA: Diagnosis not present

## 2018-05-02 DIAGNOSIS — E785 Hyperlipidemia, unspecified: Secondary | ICD-10-CM | POA: Insufficient documentation

## 2018-05-02 NOTE — Progress Notes (Signed)
Subjective:    Patient ID: [name], male    DOB: 30-Apr-1940, 78 y.o.   MRN: [id number] 78 year old male with history of CAD, DJD, OSA, DVTs, CVA who was admitted on 01/01/18 with acute onset of left facial weakness, dysarthria, left-sided weakness and fall.  CT perfusion done revealing small acute right frontal/MCA penumbra and CTA head neck showed moderate cerebral artery atherosclerosis with patient's mid stenosis right M2 origin.  MRI brain showed multiple right CVA.  Patient has had issues with lethargy as well as difficulty handling secretion and he was placed on dysphagia 1 diet with nectar liquids.  Carotid Dopplers done showing large soft plaque and right ICA bifurcation however no significant stenosis noted.  TEE was negative for thrombus or PFO and loop recorder placed by Dr. Lovena Le.  Neurology recommended aspirin Plavix for embolic stroke felt to be from a right-ICA soft plaque versus cardioembolic source.  Patient with residual left-sided weakness with left inattention as well as cognitive deficits affecting ADLs and mobility.  CIR was recommended for follow-up therapy Admit date: 01/05/2018 Discharge date: 01/22/2018 HPI  Practicing driving  Still with Left arm weakness  No Falls  Walked a car show last weekend   Left shoulder pain worse when sleeping on the left side Pain Inventory Average Pain 0 Pain Right Now 0 My pain is na  In the last 24 hours, has pain interfered with the following? General activity 0 Relation with others 0 Enjoyment of life 0 What TIME of day is your pain at its worst? na Sleep (in general) Good  Pain is worse with: na Pain improves with: na Relief from Meds: na  Mobility how many minutes can you walk? 30 ability to climb steps?  yes do you drive?  no  Function retired I need assistance with the following:  meal prep and household duties  Neuro/Psych weakness trouble walking  Prior Studies Any changes since last visit?   no  Physicians involved in your care Any changes since last visit?  no   Family History  Problem Relation Age of Onset  . Gout Mother   . Stroke Mother   . Coronary artery disease Mother   . Hypertension Mother   . Arthritis Mother   . Coronary artery disease Father   . Arthritis Father   . Gout Father   . Stroke Father   . Hypertension Father   . Coronary artery disease Brother   . Arthritis Brother   . Gout Brother   . Stroke Brother   . Hypertension Brother   . Coronary artery disease Brother   . Arthritis Brother   . Gout Brother   . Stroke Brother   . Hypertension Brother   . Coronary artery disease Other        significant in multiple family members   Social History   Socioeconomic History  . Marital status: Married    Spouse name: [name]  . Number of children: Not on file  . Years of education: Not on file  . Highest education level: Not on file  Occupational History  . Not on file  Social Needs  . Financial resource strain: Not on file  . Food insecurity:    Worry: Not on file    Inability: Not on file  . Transportation needs:    Medical: Not on file    Non-medical: Not on file  Tobacco Use  . Smoking status: Former Smoker    Types: Cigars  .  Smokeless tobacco: Never Used  . Tobacco comment: used to smoke cigars, 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, none since  Substance and Sexual Activity  . Alcohol use: No  . Drug use: No  . Sexual activity: Not on file  Lifestyle  . Physical activity:    Days per week: Not on file    Minutes per session: Not on file  . Stress: Not on file  Relationships  . Social connections:    Talks on phone: Not on file    Gets together: Not on file    Attends religious service: Not on file    Active member of club or organization: Not on file    Attends meetings of clubs or organizations: Not on file    Relationship status: Not on file  Other Topics Concern  . Not on file  Social History Narrative  . Not on file    Past Surgical History:  Procedure Laterality Date  . CORONARY ARTERY BYPASS GRAFT  2005   LIMA to LAD, SVG to OM1 and OM 2, RCA.   [name] left inguinal hernia repair    . LOOP RECORDER INSERTION N/A 01/03/2018   Procedure: LOOP RECORDER INSERTION;  Surgeon: Evans Lance, MD;  Location: [place] [facility];  Service: Cardiovascular;  Laterality: N/A;  . TEE WITHOUT CARDIOVERSION N/A 12/11/2016   Procedure: TRANSESOPHAGEAL ECHOCARDIOGRAM (TEE);  Surgeon: [name], MD;  Location: [facility];  Service: Cardiovascular;  Laterality: N/A;  . TEE WITHOUT CARDIOVERSION N/A 01/03/2018   Procedure: TRANSESOPHAGEAL ECHOCARDIOGRAM (TEE);  Surgeon: Dorothy Spark, MD;  Location: [facility];  Service: Cardiovascular;  Laterality: N/A;   Past Medical History:  Diagnosis Date  . CAD (coronary artery disease)    s/p cath in 2005 and CABG x4  . Cataract   . DJD (degenerative joint disease)   . DJD (degenerative joint disease)   . Fluid retention    mild  . H/O: gout   . History of blood clots   . HTN (hypertension)   . Hyperlipidemia   . Hyperlipidemia   . Myocardial infarction ([place]) 2009  . Obesity    with reduction  . OSA (obstructive sleep apnea)    AHI 98/hr  . Renal insufficiency   . Sleep apnea   . Stroke ([place]) 2000   BP (!) 151/87   Pulse 71   Ht 6\' 1"  (1.854 m)   Wt 270 lb (122.5 kg)   SpO2 97%   BMI 35.62 kg/m   Opioid Risk Score:   Fall Risk Score:  `1  Depression screen PHQ 2/9  Depression screen [facility] 2/9 05/02/2018 01/13/2017  Decreased Interest 0 0  Down, Depressed, Hopeless 0 0  PHQ - 2 Score 0 0    Review of Systems  Constitutional: Negative.   HENT: Negative.   Eyes: Negative.   Respiratory: Negative.   Cardiovascular: Negative.   Gastrointestinal: Negative.   Endocrine: Negative.   Genitourinary: Negative.   Musculoskeletal: Positive for gait problem.  Skin: Negative.   Allergic/Immunologic: Negative.   Neurological: Positive for weakness.   Hematological: Negative.   Psychiatric/Behavioral: Negative.   All other systems reviewed and are negative.      Objective:   Physical Exam  Left shoulder Reduced external rotation Pain with abduction/forward flexion with 0-75deg range     Assessment & Plan:  1.  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  Left shoulder adhesive tendinitis Shoulder injectionLeft    Indication:LEFT Shoulder pain not relieved by medication management and other conservative care.  Informed consent was obtained after describing risks and benefits of the procedure with the patient, this includes bleeding, bruising, infection and medication side effects. The patient wishes to proceed and has given written consent. Patient was placed in a seated position. TheLEFT shoulder was marked and prepped with betadine in the subacromial area. A 25-gauge 1-1/2 inch needle was inserted into the subacromial area. After negative draw back for blood, a solution containing 1 mL of 6 mg per ML betamethasone and 4 mL of 1% lidocaine was injected. A band aid was applied. The patient tolerated the procedure well. Post procedure instructions were given.

## 2018-05-03 ENCOUNTER — Ambulatory Visit: Payer: Medicare Other | Attending: Physical Medicine & Rehabilitation | Admitting: Occupational Therapy

## 2018-05-03 ENCOUNTER — Ambulatory Visit: Payer: Medicare Other | Admitting: Physical Therapy

## 2018-05-03 DIAGNOSIS — I69354 Hemiplegia and hemiparesis following cerebral infarction affecting left non-dominant side: Secondary | ICD-10-CM | POA: Diagnosis not present

## 2018-05-03 DIAGNOSIS — M25512 Pain in left shoulder: Secondary | ICD-10-CM | POA: Insufficient documentation

## 2018-05-03 DIAGNOSIS — R278 Other lack of coordination: Secondary | ICD-10-CM | POA: Diagnosis present

## 2018-05-03 DIAGNOSIS — M25612 Stiffness of left shoulder, not elsewhere classified: Secondary | ICD-10-CM | POA: Insufficient documentation

## 2018-05-03 DIAGNOSIS — M25642 Stiffness of left hand, not elsewhere classified: Secondary | ICD-10-CM | POA: Diagnosis present

## 2018-05-03 DIAGNOSIS — R2689 Other abnormalities of gait and mobility: Secondary | ICD-10-CM | POA: Diagnosis present

## 2018-05-03 DIAGNOSIS — R2681 Unsteadiness on feet: Secondary | ICD-10-CM | POA: Insufficient documentation

## 2018-05-03 NOTE — Therapy (Signed)
[facility] [address] [place], [place], [address] Phone: [phone]   Fax:  [phone]  Occupational Therapy Treatment  Patient Details  Name: [name] MRN: [id number] Date of Birth: 19-Nov-1940 Referring Provider (OT): Dr. Alysia Penna   Encounter Date: 05/03/2018  OT End of Session - 05/03/18 1738    Visit Number  5    Number of Visits  25    Date for OT Re-Evaluation  07/04/18    Authorization Type  [employer]     Authorization - Visit Number  5    Authorization - Number of Visits  10    OT Start Time  5427    OT Stop Time  1445    OT Time Calculation (min)  38 min       Past Medical History:  Diagnosis Date  . CAD (coronary artery disease)    s/p cath in 2005 and CABG x4  . Cataract   . DJD (degenerative joint disease)   . DJD (degenerative joint disease)   . Fluid retention    mild  . H/O: gout   . History of blood clots   . HTN (hypertension)   . Hyperlipidemia   . Hyperlipidemia   . Myocardial infarction ([place]) 2009  . Obesity    with reduction  . OSA (obstructive sleep apnea)    AHI 98/hr  . Renal insufficiency   . Sleep apnea   . Stroke [facility]) 2000    Past Surgical History:  Procedure Laterality Date  . CORONARY ARTERY BYPASS GRAFT  2005   LIMA to LAD, SVG to OM1 and OM 2, RCA.   [name] left inguinal hernia repair    . LOOP RECORDER INSERTION N/A 01/03/2018   Procedure: LOOP RECORDER INSERTION;  Surgeon: Evans Lance, MD;  Location: [facility];  Service: Cardiovascular;  Laterality: N/A;  . TEE WITHOUT CARDIOVERSION N/A 12/11/2016   Procedure: TRANSESOPHAGEAL ECHOCARDIOGRAM (TEE);  Surgeon: [name], MD;  Location: [facility] ENDOSCOPY;  Service: Cardiovascular;  Laterality: N/A;  . TEE WITHOUT CARDIOVERSION N/A 01/03/2018   Procedure: TRANSESOPHAGEAL ECHOCARDIOGRAM (TEE);  Surgeon: Dorothy Spark, MD;  Location: [facility] ENDOSCOPY;  Service: Cardiovascular;  Laterality:  N/A;    There were no vitals filed for this visit.  Subjective Assessment - 05/03/18 1750    Pertinent History  Hemiparesis affecting L side due to CVA.  PMH:  CAD, s/p CABGx4 2005, MI, HTN, dylipidemia, gout, arrhythmia, OSA, DJD, loop recorder    Patient Stated Goals  improve grip/use of L hand, to be able to grip steering wheel and close door in order to drive again    Currently in Pain?  Yes    Pain Score  1     Pain Location  Shoulder    Pain Orientation  Left    Pain Descriptors / Indicators  Aching    Pain Type  Chronic pain    Pain Onset  1 to 4 weeks ago    Pain Frequency  Intermittent    Aggravating Factors   reaching    Pain Relieving Factors  rest           Treatment: supine, gentle joint and scapular mobs followed by closed chain shoulder flexion and chest pres, mod facilitation for scapular positioning. Seated AA/ROM mid range with UE ranger and standing to roll ball forwards and back on mat for gentle stretch Functional reaching to manipulate checkers and place into connect four frame,  min v.c Rotating an ball in left hand for increased fine motor coordination, min v.c                  OT Short Term Goals - 04/05/18 1354      OT SHORT TERM GOAL #1   Title  Pt will be independent with initial HEP.--check STGs 05/17/18    Time  6    Period  Weeks    Status  New      OT SHORT TERM GOAL #2   Title  Pt will demo at least 110* PROM L shoulder flex with no pain to assist with functional reach.    Time  6    Period  Weeks    Status  New      OT SHORT TERM GOAL #3   Title  Pt will be able to use L hand to assist with tying shoes, pulling up pants/socks.    Time  6    Period  Weeks    Status  New      OT SHORT TERM GOAL #4   Title  Pt will demo at least 80* L shoulder flex AROM with min compensation to be able to retrieve light object.    Time  6    Period  Weeks    Status  New      OT SHORT TERM GOAL #5   Title  Pt will demo at least 90%  gross composite finger flex to assist with grasping objects with L hand.    Time  6    Period  Weeks    Status  New      OT SHORT TERM GOAL #6   Title  Pt will perform simple home maintenance task mod I.    Time  6    Period  Weeks    Status  New        OT Long Term Goals - 04/05/18 1358      OT LONG TERM GOAL #1   Title  Pt will be independent with updated HEP.--check LTGs 07/04/18    Time  12    Period  Weeks    Status  New      OT LONG TERM GOAL #2   Title  Pt will demo at least 20lbs L grip strength to assist in opening jars/containers.    Time  12    Period  Weeks    Status  New      OT LONG TERM GOAL #3   Title  Pt will perform simple cooking task with supervision.    Time  12    Period  Weeks    Status  New      OT LONG TERM GOAL #4   Title  Pt will improve functional reaching/coordination as shown by improving score on box and blocks test by at least 10    Baseline  L-16 blocks    Time  6    Period  Weeks    Status  New      OT LONG TERM GOAL #5   Title  Pt will demo improved L hand coordination as shown by completing 9-hole peg test in less than 2 min.    Baseline  unable    Time  6    Period  Weeks    Status  New      OT LONG TERM GOAL #6   Title  Pt will demo at least 90* L shoulder  flex to retrieve lightweight object with no pain.    Time  6    Period  Weeks    Status  New            Plan - 05/03/18 1749    Clinical Impression Statement  P ti progressing slowly towards goals. pt requires v.c to avoid compensatory patterns with left shoulder.    Occupational Profile and client history currently impacting functional performance  Pt was independent prior to CVA.  Pt is now performing BADLs mod I with difficulty using LUE.  Pt is not currently performing previous IADLs like driving, cooking, home maintenance tasks or leisure tasks such as bowling.    Occupational performance deficits (Please refer to evaluation for details):   ADL's;IADL's;Leisure;Social Participation    Rehab Potential  Good    OT Frequency  2x / week    OT Duration  12 weeks    OT Treatment/Interventions  Self-care/ADL training;Electrical Stimulation;Therapeutic exercise;Patient/family education;Splinting;Neuromuscular education;Paraffin;Moist Heat;Aquatic Therapy;Fluidtherapy;Therapist, nutritional;Therapeutic activities;Balance training;Passive range of motion;Manual Therapy;DME and/or AE instruction;Ultrasound;Cryotherapy;Contrast [employer]  continue with neuro re-ed, LUE functional use    Consulted and Agree with Plan of Care  Patient       Patient will benefit from skilled therapeutic intervention in order to improve the following deficits and impairments:     Visit Diagnosis: Hemiplegia and hemiparesis following cerebral infarction affecting left non-dominant side (HCC)  Stiffness of left shoulder, not elsewhere classified  Left shoulder pain, unspecified chronicity    Problem List Patient Active Problem List   Diagnosis Date Noted  . Abnormal peripheral vision, left 01/21/2018  . Anemia, chronic disease   . Left hemiparesis ([place])   . Acute ischemic cerebrovascular accident (CVA) involving right middle cerebral artery territory [facility])   . CKD (chronic kidney disease), stage III ([place])   . Peripheral edema   . Stroke (cerebrum) ([place]) 01/05/2018  . History of gout   . AKI (acute kidney injury) ([place])   . Stage 3 chronic kidney disease ([place])   . Anemia of chronic disease   . Left-sided weakness   . Osteoarthritis   . OSA (obstructive sleep apnea)   . Noncompliance with CPAP treatment   . History of DVT (deep vein thrombosis)   . History of CVA (cerebrovascular accident)   . Dysphagia, post-stroke   . Sinus tachycardia   . Acute blood loss anemia   . Acute CVA (cerebrovascular accident) ([place]) 01/01/2018  . Hypertensive urgency 01/01/2018  . CKD (chronic kidney disease) stage 2, GFR 60-89 ml/min 01/01/2018  .  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 Generalized weakness   . Sepsis ([place]) 12/10/2016  . Bacteremia due to Streptococcus 12/10/2016  . Lactic acidosis 12/10/2016  . Elevated troponin 12/10/2016  . Renal insufficiency 12/10/2016  . Hypophosphatemia 12/10/2016  . Left leg swelling 12/10/2016  . Febrile illness 12/09/2016  . Febrile illness, acute   . OBESITY, UNSPECIFIED 05/31/2009  . Hyperlipidemia 05/30/2009  . Obstructive sleep apnea 05/30/2009  . Essential hypertension 05/30/2009  . CAD (coronary artery disease) 05/30/2009    , 05/03/2018, 5:52 PM  [facility] [address] [place], [place], [address] Phone: [phone]   Fax:  [phone]  Name: [name] MRN: [id number] Date of Birth: [birth year]

## 2018-05-05 ENCOUNTER — Ambulatory Visit: Payer: Medicare Other | Admitting: Occupational Therapy

## 2018-05-05 ENCOUNTER — Encounter: Payer: Self-pay | Admitting: Physical Therapy

## 2018-05-05 ENCOUNTER — Ambulatory Visit: Payer: Medicare Other | Admitting: Physical Therapy

## 2018-05-05 DIAGNOSIS — R2689 Other abnormalities of gait and mobility: Secondary | ICD-10-CM

## 2018-05-05 DIAGNOSIS — I69354 Hemiplegia and hemiparesis following cerebral infarction affecting left non-dominant side: Secondary | ICD-10-CM | POA: Diagnosis not present

## 2018-05-05 DIAGNOSIS — R2681 Unsteadiness on feet: Secondary | ICD-10-CM

## 2018-05-05 NOTE — Therapy (Signed)
[facility] [address] [place], [place], [address] Phone: [phone]   Fax:  [phone]  Physical Therapy Treatment  Patient Details  Name: [name] MRN: [id number] Date of Birth: 1940/12/23 Referring Provider (PT): Alysia Penna, MD   Encounter Date: 05/05/2018  PT End of Session - 05/05/18 1214    Visit Number  5    Number of Visits  17    Date for PT Re-Evaluation  07/04/18   POC written for 90 days, plan to see for 60 days   Authorization Type  Medicare-Needs 10th visit progress note    PT Start Time  1015    PT Stop Time  1100    PT Time Calculation (min)  45 min    Activity Tolerance  Patient tolerated treatment well    Behavior During Therapy  [facility] for tasks assessed/performed       Past Medical History:  Diagnosis Date  . CAD (coronary artery disease)    s/p cath in 2005 and CABG x4  . Cataract   . DJD (degenerative joint disease)   . DJD (degenerative joint disease)   . Fluid retention    mild  . H/O: gout   . History of blood clots   . HTN (hypertension)   . Hyperlipidemia   . Hyperlipidemia   . Myocardial infarction ([place]) 2009  . Obesity    with reduction  . OSA (obstructive sleep apnea)    AHI 98/hr  . Renal insufficiency   . Sleep apnea   . Stroke [facility]) 2000    Past Surgical History:  Procedure Laterality Date  . CORONARY ARTERY BYPASS GRAFT  2005   LIMA to LAD, SVG to OM1 and OM 2, RCA.   [name] left inguinal hernia repair    . LOOP RECORDER INSERTION N/A 01/03/2018   Procedure: LOOP RECORDER INSERTION;  Surgeon: Evans Lance, MD;  Location: [facility];  Service: Cardiovascular;  Laterality: N/A;  . TEE WITHOUT CARDIOVERSION N/A 12/11/2016   Procedure: TRANSESOPHAGEAL ECHOCARDIOGRAM (TEE);  Surgeon: [name], MD;  Location: [facility] ENDOSCOPY;  Service: Cardiovascular;  Laterality: N/A;  . TEE WITHOUT CARDIOVERSION N/A 01/03/2018   Procedure: TRANSESOPHAGEAL  ECHOCARDIOGRAM (TEE);  Surgeon: Dorothy Spark, MD;  Location: [facility] ENDOSCOPY;  Service: Cardiovascular;  Laterality: N/A;    There were no vitals filed for this visit.  Subjective Assessment - 05/05/18 1019    Subjective  Pt doing well, pain in UE has improved and feels his balance has improved.  Saw Dr. Letta Pate who gave him an injection in his L shoulder.    Pertinent History  CAD s/p CABG 2005, HTN, HLD, OSA, DVTs    Limitations  Walking;House hold activities    Patient Stated Goals  "I would like to be able to walk outside better."     Currently in Pain?  No/denies         [facility] PT Assessment - 05/05/18 1020      6 Minute Walk- Baseline   6 Minute Walk- Baseline  yes    BP (mmHg)  (!) 155/95    HR (bpm)  (!) 48    02 Sat (%RA)  98 %    Modified Borg Scale for Dyspnea  0- Nothing at all    Perceived Rate of Exertion (Borg)  6-      6 Minute walk- Post Test   6 Minute Walk Post Test  yes    BP (mmHg)  [name])  168/101   147/89 after 3-4 minutes of sitting rest break   HR (bpm)  56    02 Sat (%RA)  100 %    Modified Borg Scale for Dyspnea  0- Nothing at all    Perceived Rate of Exertion (Borg)  10-      6 minute walk test results    Aerobic Endurance Distance Walked  985    Endurance additional comments  no AD, no rest breaks, continued foot slap noted      Standardized Balance Assessment   Standardized Balance Assessment  Five Times Sit to Stand;10 meter walk test    Five times sit to stand comments   12.78 seconds from mat, no UE support, wide BOS and mildly uncontrolled descent    10 Meter Walk  9.41 seconds or 3.48 ft/sec      Functional Gait  Assessment   Gait assessed   Yes    Gait Level Surface  Walks 20 ft in less than 7 sec but greater than 5.5 sec, uses assistive device, slower speed, mild gait deviations, or deviates 6-10 in outside of the 12 in walkway width.    Change in Gait Speed  Able to change speed, demonstrates mild gait deviations, deviates 6-10 in  outside of the 12 in walkway width, or no gait deviations, unable to achieve a major change in velocity, or uses a change in velocity, or uses an assistive device.    Gait with Horizontal Head Turns  Performs head turns smoothly with no change in gait. Deviates no more than 6 in outside 12 in walkway width    Gait with Vertical Head Turns  Performs head turns with no change in gait. Deviates no more than 6 in outside 12 in walkway width.    Gait and Pivot Turn  Pivot turns safely in greater than 3 sec and stops with no loss of balance, or pivot turns safely within 3 sec and stops with mild imbalance, requires small steps to catch balance.    Step Over Obstacle  Is able to step over one shoe box (4.5 in total height) without changing gait speed. No evidence of imbalance.    Gait with Narrow Base of Support  Ambulates less than 4 steps heel to toe or cannot perform without assistance.    Gait with Eyes Closed  Walks 20 ft, uses assistive device, slower speed, mild gait deviations, deviates 6-10 in outside 12 in walkway width. Ambulates 20 ft in less than 9 sec but greater than 7 sec.    Ambulating Backwards  Walks 20 ft, uses assistive device, slower speed, mild gait deviations, deviates 6-10 in outside 12 in walkway width.    Steps  Alternating feet, must use rail.    Total Score  20    FGA comment:  20/30  medium falls risk                           PT Education - 05/05/18 1213    Education Details  progress towards goals and areas to continue to address    Person(s) Educated  Patient    Methods  Explanation    Comprehension  Verbalized understanding       PT Short Term Goals - 05/05/18 1058      PT SHORT TERM GOAL #1   Title  Pt will initiate HEP in order to indicate improved functional mobility and balance.  (Target Date: 05/05/2018)  Time  4    Period  Weeks    Status  On-going    Target Date  05/05/18      PT SHORT TERM GOAL #2   Title  Pt will improve gait  speed to >/=3.60 ft/sec in order to indicate improved efficiency of gait.     Baseline  3.48 ft/sec, improved but not to goal    Time  4    Period  Weeks    Status  Partially Met      PT SHORT TERM GOAL #3   Title  Pt will improve 5TSS to </=11 secs without UE support with decreased BOS in order to indicate decreased fall risk.     Baseline  12 seconds, improved but not to goal    Time  4    Period  Weeks    Status  Partially Met      PT SHORT TERM GOAL #4   Title  Pt will improve FGA to >/=21/30 in order to indicate decreased fall risk.     Baseline  20/30 improved but not to goal    Time  4    Period  Weeks    Status  Partially Met      PT SHORT TERM GOAL #5   Title  Pt will perform 6MWT and improve distance by 150' in order to indicate improved functional endurance.     Baseline  985'    Time  4    Period  Weeks    Status  Not Met      PT SHORT TERM GOAL #6   Title  Pt will ambulate up to 500' over unlevel paved outdoor surfaces w/o AD at S level in order to indicate improved community mobility.     Time  4    Period  Weeks    Status  New        PT Long Term Goals - 05/05/18 1058      PT LONG TERM GOAL #1   Title  Pt will be independent with final HEP in order to indicate decreased fall risk and improved functional mobility.  (Target Date: 06/04/2018)    Time  8    Period  Weeks    Status  New      PT LONG TERM GOAL #2   Title  Pt will improve FGA to >/=24/30 in order to indicate decreased fall risk.      Time  8    Period  Weeks    Status  New      PT LONG TERM GOAL #3   Title  Pt will improve 6MWT by 300' from baseline in order to indicate improved functional endurance.     Time  8    Period  Weeks    Status  New      PT LONG TERM GOAL #4   Title  Pt will ambulate up to 1000' over varying outdoor surfaces (grass, inclines/declines, curb/ramp) without AD at mod I level in order to indicate improved community mobility.      Time  8    Period  Weeks     Status  New      PT LONG TERM GOAL #5   Title  Pt will demonstrate ability to simulate bowling without overt LOB in order to return to leisure activity.     Time  8    Period  Weeks    Status  New  Plan - 05/05/18 1214    Clinical Impression Statement  Treatment session focused on assessment of outcome measures to determine progress towards STG.  Pt demonstrates improvements in LE strength/five time sit to stand, gait velocity and balance during higher level gait challenges but not to goal level.  Pt actually experienced a decrease in his distance during 6 minute walk test and demonstrated elevated BP and bradycardia today but pt was asymptomatic during and after testing.  Will complete assessment of final STG at next session.    Rehab Potential  Good    Clinical Impairments Affecting Rehab Potential  Pt motivated to improve mobility    PT Frequency  2x / week    PT Duration  8 weeks    PT Treatment/Interventions  ADLs/Self Care Home Management;Aquatic Therapy;Electrical Stimulation;Gait training;Stair training;DME Instruction;Functional mobility training;Therapeutic activities;Therapeutic exercise;Balance training;Neuromuscular re-education;Patient/family education;Orthotic Fit/Training;Passive range of motion;Vestibular    PT Next Visit Plan  MONITOR Vitals on RUE, especially HR-runs pretty low.   finish checking STG - HEP and gait outside.  For foot slap - have yall tried foot up brace??    Consulted and Agree with Plan of Care  Patient       Patient will benefit from skilled therapeutic intervention in order to improve the following deficits and impairments:  Abnormal gait, Decreased activity tolerance, Decreased balance, Decreased endurance, Decreased mobility, Decreased range of motion, Impaired perceived functional ability, Impaired flexibility, Impaired UE functional use, Postural dysfunction  Visit Diagnosis: Hemiplegia and hemiparesis following cerebral infarction  affecting left non-dominant side (HCC)  Unsteadiness on feet  Other abnormalities of gait and mobility     Problem List Patient Active Problem List   Diagnosis Date Noted  . Abnormal peripheral vision, left 01/21/2018  . Anemia, chronic disease   . Left hemiparesis ([place])   . Acute ischemic cerebrovascular accident (CVA) involving right middle cerebral artery territory [facility])   . CKD (chronic kidney disease), stage III ([place])   . Peripheral edema   . Stroke (cerebrum) ([place]) 01/05/2018  . History of gout   . AKI (acute kidney injury) ([place])   . Stage 3 chronic kidney disease ([place])   . Anemia of chronic disease   . Left-sided weakness   . Osteoarthritis   . OSA (obstructive sleep apnea)   . Noncompliance with CPAP treatment   . History of DVT (deep vein thrombosis)   . History of CVA (cerebrovascular accident)   . Dysphagia, post-stroke   . Sinus tachycardia   . Acute blood loss anemia   . Acute CVA (cerebrovascular accident) ([place]) 01/01/2018  . Hypertensive urgency 01/01/2018  . CKD (chronic kidney disease) stage 2, GFR 60-89 ml/min 01/01/2018  . 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 Generalized weakness   . Sepsis ([place]) 12/10/2016  . Bacteremia due to Streptococcus 12/10/2016  . Lactic acidosis 12/10/2016  . Elevated troponin 12/10/2016  . Renal insufficiency 12/10/2016  . Hypophosphatemia 12/10/2016  . Left leg swelling 12/10/2016  . Febrile illness 12/09/2016  . Febrile illness, acute   . OBESITY, UNSPECIFIED 05/31/2009  . Hyperlipidemia 05/30/2009  . Obstructive sleep apnea 05/30/2009  . Essential hypertension 05/30/2009  . CAD (coronary artery disease) 05/30/2009    Rico Junker, PT, DPT 05/05/18    12:25 PM    [facility] [address] [place], [place], [address] Phone: [phone]   Fax:  [phone]  Name: [name] MRN: [id number] Date of Birth: 1940/09/30

## 2018-05-09 ENCOUNTER — Encounter: Payer: Self-pay | Admitting: Rehabilitation

## 2018-05-09 ENCOUNTER — Encounter: Payer: Self-pay | Admitting: Occupational Therapy

## 2018-05-09 ENCOUNTER — Ambulatory Visit: Payer: Medicare Other | Admitting: Occupational Therapy

## 2018-05-09 ENCOUNTER — Ambulatory Visit: Payer: Medicare Other | Admitting: Rehabilitation

## 2018-05-09 DIAGNOSIS — I69354 Hemiplegia and hemiparesis following cerebral infarction affecting left non-dominant side: Secondary | ICD-10-CM

## 2018-05-09 DIAGNOSIS — M25642 Stiffness of left hand, not elsewhere classified: Secondary | ICD-10-CM

## 2018-05-09 DIAGNOSIS — M25512 Pain in left shoulder: Secondary | ICD-10-CM

## 2018-05-09 DIAGNOSIS — R2689 Other abnormalities of gait and mobility: Secondary | ICD-10-CM

## 2018-05-09 DIAGNOSIS — R2681 Unsteadiness on feet: Secondary | ICD-10-CM

## 2018-05-09 DIAGNOSIS — R278 Other lack of coordination: Secondary | ICD-10-CM

## 2018-05-09 DIAGNOSIS — M25612 Stiffness of left shoulder, not elsewhere classified: Secondary | ICD-10-CM

## 2018-05-09 NOTE — Therapy (Signed)
[facility] [address] [place], [place], [address] Phone: [phone]   Fax:  [phone]  Occupational Therapy Treatment  Patient Details  Name: [name] MRN: [id number] Date of Birth: April 21, 1940 Referring Provider (OT): Dr. Alysia Penna   Encounter Date: 05/09/2018  OT End of Session - 05/09/18 1133    Visit Number  6    Number of Visits  25    Date for OT Re-Evaluation  07/04/18    Authorization Type  [employer] Medicare     Authorization - Visit Number  6    Authorization - Number of Visits  10    OT Start Time  1107    OT Stop Time  1148    OT Time Calculation (min)  41 min    Equipment Utilized During Treatment  **FOTO    Activity Tolerance  Patient tolerated treatment well    Behavior During Therapy  [facility] for tasks assessed/performed       Past Medical History:  Diagnosis Date  . CAD (coronary artery disease)    s/p cath in 2005 and CABG x4  . Cataract   . DJD (degenerative joint disease)   . DJD (degenerative joint disease)   . Fluid retention    mild  . H/O: gout   . History of blood clots   . HTN (hypertension)   . Hyperlipidemia   . Hyperlipidemia   . Myocardial infarction ([place]) 2009  . Obesity    with reduction  . OSA (obstructive sleep apnea)    AHI 98/hr  . Renal insufficiency   . Sleep apnea   . Stroke [facility]) 2000    Past Surgical History:  Procedure Laterality Date  . CORONARY ARTERY BYPASS GRAFT  2005   LIMA to LAD, SVG to OM1 and OM 2, RCA.   [name] left inguinal hernia repair    . LOOP RECORDER INSERTION N/A 01/03/2018   Procedure: LOOP RECORDER INSERTION;  Surgeon: Evans Lance, MD;  Location: [place] CV LAB;  Service: Cardiovascular;  Laterality: N/A;  . TEE WITHOUT CARDIOVERSION N/A 12/11/2016   Procedure: TRANSESOPHAGEAL ECHOCARDIOGRAM (TEE);  Surgeon: [name], MD;  Location: [facility] ENDOSCOPY;  Service: Cardiovascular;  Laterality: N/A;  . TEE WITHOUT CARDIOVERSION  N/A 01/03/2018   Procedure: TRANSESOPHAGEAL ECHOCARDIOGRAM (TEE);  Surgeon: Dorothy Spark, MD;  Location: [facility] ENDOSCOPY;  Service: Cardiovascular;  Laterality: N/A;    There were no vitals filed for this visit.  Subjective Assessment - 05/09/18 1109    Subjective   Pt reports that he is able to pull pants up and put L sock on with LUE now, hasn't been able to tie shoes yet    Pertinent History  Hemiparesis affecting L side due to CVA.  PMH:  CAD, s/p CABGx4 2005, MI, HTN, dylipidemia, gout, arrhythmia, OSA, DJD, loop recorder    Patient Stated Goals  improve grip/use of L hand, to be able to grip steering wheel and close door in order to drive again    Currently in Pain?  No/denies   In L shoulder   Pain Onset  1 to 4 weeks ago       Placing various sized pegs in arc pegboard for incr coordination with mod difficulty, min cueing to avoid shoulder compensation.  Stacking 1-inch blocks with mod difficulty, pt able to stack 5-6 blocks  Pt demo approx 85% gross finger flex L hand.   Neuro re-ed:  In supine, BUEs closed-chain/AAROM shoulder flex  and chest press with cane with min-mod facilitation for normal movement patterns.  In modified quadruped, forward/backward wt. Shifts incr core/scapular stability, incr tricep activation   In sitting, wt. Bearing though hand on mat with body on arm movements for incr scapular stability, incr tricep activation, min cues  Sitting, rolling ball forward/backward for AAROM shoulder flex   Manual:  Gentle joint mobs to L shoulder followed by L PROM shoulder flex at end-range and supination/ER          OT Short Term Goals - 05/09/18 1134      OT SHORT TERM GOAL #1   Title  Pt will be independent with initial HEP.--check STGs 05/17/18    Time  6    Period  Weeks    Status  New      OT SHORT TERM GOAL #2   Title  Pt will demo at least 110* PROM L shoulder flex with no pain to assist with functional reach.    Time  6    Period  Weeks     Status  Achieved   05/09/18:  met at approx this level     OT SHORT TERM GOAL #3   Title  Pt will be able to use L hand to assist with tying shoes, pulling up pants/socks.    Time  6    Period  Weeks    Status  On-going   05/09/18 met with pulling up pants/socks, not yet met with tying shoes     OT SHORT TERM GOAL #4   Title  Pt will demo at least 80* L shoulder flex AROM with min compensation to be able to retrieve light object.    Time  6    Period  Weeks    Status  New      OT SHORT TERM GOAL #5   Title  Pt will demo at least 90% gross composite finger flex to assist with grasping objects with L hand.    Time  6    Period  Weeks    Status  New      OT SHORT TERM GOAL #6   Title  Pt will perform simple home maintenance task mod I.    Time  6    Period  Weeks    Status  New        OT Long Term Goals - 04/05/18 1358      OT LONG TERM GOAL #1   Title  Pt will be independent with updated HEP.--check LTGs 07/04/18    Time  12    Period  Weeks    Status  New      OT LONG TERM GOAL #2   Title  Pt will demo at least 20lbs L grip strength to assist in opening jars/containers.    Time  12    Period  Weeks    Status  New      OT LONG TERM GOAL #3   Title  Pt will perform simple cooking task with supervision.    Time  12    Period  Weeks    Status  New      OT LONG TERM GOAL #4   Title  Pt will improve functional reaching/coordination as shown by improving score on box and blocks test by at least 10    Baseline  L-16 blocks    Time  6    Period  Weeks    Status  New  OT LONG TERM GOAL #5   Title  Pt will demo improved L hand coordination as shown by completing 9-hole peg test in less than 2 min.    Baseline  unable    Time  6    Period  Weeks    Status  New      OT LONG TERM GOAL #6   Title  Pt will demo at least 90* L shoulder flex to retrieve lightweight object with no pain.    Time  6    Period  Weeks    Status  New            Plan - 05/09/18  1133    Clinical Impression Statement  Pt is progressing slowly towards goals. pt requires v.c to avoid compensatory patterns with left shoulder.  However, demo improved LUE functional use and decr shoulder pain.  (pt now scheduled 1x wk due to financial concerns).    Occupational Profile and client history currently impacting functional performance  Pt was independent prior to CVA.  Pt is now performing BADLs mod I with difficulty using LUE.  Pt is not currently performing previous IADLs like driving, cooking, home maintenance tasks or leisure tasks such as bowling.    Occupational performance deficits (Please refer to evaluation for details):  ADL's;IADL's;Leisure;Social Participation    Rehab Potential  Good    OT Frequency  2x / week    OT Duration  12 weeks    OT Treatment/Interventions  Self-care/ADL training;Electrical Stimulation;Therapeutic exercise;Patient/family education;Splinting;Neuromuscular education;Paraffin;Moist Heat;Aquatic Therapy;Fluidtherapy;Therapist, nutritional;Therapeutic activities;Balance training;Passive range of motion;Manual Therapy;DME and/or AE instruction;Ultrasound;Cryotherapy;Contrast Bath    Plan  continue with neuro re-ed, LUE functional use, shoulder ext with cane/scapular retraction?, wt. bearing, ?putty     Consulted and Agree with Plan of Care  Patient       Patient will benefit from skilled therapeutic intervention in order to improve the following deficits and impairments:     Visit Diagnosis: Hemiplegia and hemiparesis following cerebral infarction affecting left non-dominant side (HCC)  Unsteadiness on feet  Other abnormalities of gait and mobility  Stiffness of left shoulder, not elsewhere classified  Left shoulder pain, unspecified chronicity  Other lack of coordination  Stiffness of left hand, not elsewhere classified    Problem List Patient Active Problem List   Diagnosis Date Noted  . Abnormal peripheral vision, left  01/21/2018  . Anemia, chronic disease   . Left hemiparesis ([place])   . Acute ischemic cerebrovascular accident (CVA) involving right middle cerebral artery territory [facility])   . CKD (chronic kidney disease), stage III ([place])   . Peripheral edema   . Stroke (cerebrum) ([place]) 01/05/2018  . History of gout   . AKI (acute kidney injury) ([place])   . Stage 3 chronic kidney disease ([place])   . Anemia of chronic disease   . Left-sided weakness   . Osteoarthritis   . OSA (obstructive sleep apnea)   . Noncompliance with CPAP treatment   . History of DVT (deep vein thrombosis)   . History of CVA (cerebrovascular accident)   . Dysphagia, post-stroke   . Sinus tachycardia   . Acute blood loss anemia   . Acute CVA (cerebrovascular accident) ([place]) 01/01/2018  . Hypertensive urgency 01/01/2018  . CKD (chronic kidney disease) stage 2, GFR 60-89 ml/min 01/01/2018  . 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 Generalized weakness   . Sepsis ([place]) 12/10/2016  . Bacteremia due to Streptococcus 12/10/2016  . Lactic acidosis 12/10/2016  .  Elevated troponin 12/10/2016  . Renal insufficiency 12/10/2016  . Hypophosphatemia 12/10/2016  . Left leg swelling 12/10/2016  . Febrile illness 12/09/2016  . Febrile illness, acute   . OBESITY, UNSPECIFIED 05/31/2009  . Hyperlipidemia 05/30/2009  . Obstructive sleep apnea 05/30/2009  . Essential hypertension 05/30/2009  . CAD (coronary artery disease) 05/30/2009    [facility] 05/09/2018, 11:59 AM  [facility] [address] [place], [place], [address] Phone: [phone]   Fax:  [phone]  Name: [name] MRN: [id number] Date of Birth: May 22, 1940   Vianne Bulls, OTR/L [facility] [address] [place], [place]  [address] [phone] phone [phone] 05/09/18 11:59 AM

## 2018-05-09 NOTE — Therapy (Signed)
[facility] [address] [place], [place], [address] Phone: [phone]   Fax:  [phone]  Physical Therapy Treatment  Patient Details  Name: [name] MRN: [id number] Date of Birth: 1940/09/14 Referring Provider (PT): Alysia Penna, MD   Encounter Date: 05/09/2018  PT End of Session - 05/09/18 1350    Visit Number  6    Number of Visits  17    Date for PT Re-Evaluation  07/04/18   POC written for 90 days, plan to see for 60 days   Authorization Type  Medicare-Needs 10th visit progress note    PT Start Time  1147    PT Stop Time  1231    PT Time Calculation (min)  44 min    Activity Tolerance  Patient tolerated treatment well    Behavior During Therapy  [facility] for tasks assessed/performed       Past Medical History:  Diagnosis Date  . CAD (coronary artery disease)    s/p cath in 2005 and CABG x4  . Cataract   . DJD (degenerative joint disease)   . DJD (degenerative joint disease)   . Fluid retention    mild  . H/O: gout   . History of blood clots   . HTN (hypertension)   . Hyperlipidemia   . Hyperlipidemia   . Myocardial infarction ([place]) 2009  . Obesity    with reduction  . OSA (obstructive sleep apnea)    AHI 98/hr  . Renal insufficiency   . Sleep apnea   . Stroke [facility] 2000    Past Surgical History:  Procedure Laterality Date  . CORONARY ARTERY BYPASS GRAFT  2005   LIMA to LAD, SVG to OM1 and OM 2, RCA.   [name] left inguinal hernia repair    . LOOP RECORDER INSERTION N/A 01/03/2018   Procedure: LOOP RECORDER INSERTION;  Surgeon: Evans Lance, MD;  Location: [place] CV LAB;  Service: Cardiovascular;  Laterality: N/A;  . TEE WITHOUT CARDIOVERSION N/A 12/11/2016   Procedure: TRANSESOPHAGEAL ECHOCARDIOGRAM (TEE);  Surgeon: [name], MD;  Location: [facility] ENDOSCOPY;  Service: Cardiovascular;  Laterality: N/A;  . TEE WITHOUT CARDIOVERSION N/A 01/03/2018   Procedure: TRANSESOPHAGEAL  ECHOCARDIOGRAM (TEE);  Surgeon: Dorothy Spark, MD;  Location: [facility] ENDOSCOPY;  Service: Cardiovascular;  Laterality: N/A;    There were no vitals filed for this visit.  Subjective Assessment - 05/09/18 1152    Subjective  Pt reports no changes, does note weakness in L ankle.     Pertinent History  CAD s/p CABG 2005, HTN, HLD, OSA, DVTs    Limitations  Walking;House hold activities    Patient Stated Goals  "I would like to be able to walk outside better."     Currently in Pain?  No/denies                       [facility] Adult PT Treatment/Exercise - 05/09/18 1148      Ambulation/Gait   Ambulation/Gait  Yes    Ambulation/Gait Assistance  5: Supervision    Ambulation/Gait Assistance Details  Assessed final STG with outdoor gait during session.  Performed 500' over unlevel paved surfaces at S level. Did note R foot catch intermittently as well as L hip weakness, esp with increased distance, therefore would recommend S for longer outdoor distances at this time. PT trialed use of foot up  brace during session on LLE.  Pt reports marked improvement in support  and feels it keeps foot up.  Still note intermittent L foot catch, but was improved.  PT did trial PLS brace, however did not note marked difference between the two and PLS seemed to be uncomfortable.  PT provided handout from [facility] on how to purchase foot up brace.  Pt verbalized understanding.     Ambulation Distance (Feet)  500 Feet   plus another 300' indoors    Assistive device  None    Gait Pattern  Step-through pattern;Decreased arm swing - left;Decreased stance time - left;Decreased stride length;Decreased dorsiflexion - left;Decreased weight shift to left;Trendelenburg;Lateral hip instability;Trunk flexed;Wide base of support;Poor foot clearance - left    Ambulation Surface  Level;Unlevel;Indoor;Outdoor;Paved      Exercises   Exercises  Knee/Hip      Knee/Hip Exercises: Standing   Hip Abduction   Stengthening;Right;2 sets;10 reps    Abduction Limitations  Did 2 sets in standing for L hip stability/strengthening    Other Standing Knee Exercises  Side stepping while in mini squat x 4 laps down and back with UE support.     Other Standing Knee Exercises  Forward stepping over black balance beam with RLE x 15 reps for improved L hip stability/strengthening.  Pt did have single UE support, standing marching with single UE support x 15 reps with cues for increased L hip flexion.        Knee/Hip Exercises: Sidelying   Hip ABduction  Strengthening;Left;1 set;5 reps    Hip ABduction Limitations  Attempted in SL with LLE in extension, however pt only able to do 5 reps without rolling hips posteriorly.               PT Education - 05/09/18 1349    Education Details  education on foot up brace     Person(s) Educated  Patient    Methods  Explanation    Comprehension  Verbalized understanding       PT Short Term Goals - 05/09/18 1351      PT SHORT TERM GOAL #1   Title  Pt will initiate HEP in order to indicate improved functional mobility and balance.  (Target Date: 05/05/2018)    Time  4    Period  Weeks    Status  On-going    Target Date  05/05/18      PT SHORT TERM GOAL #2   Title  Pt will improve gait speed to >/=3.60 ft/sec in order to indicate improved efficiency of gait.     Baseline  3.48 ft/sec, improved but not to goal    Time  4    Period  Weeks    Status  Partially Met      PT SHORT TERM GOAL #3   Title  Pt will improve 5TSS to </=11 secs without UE support with decreased BOS in order to indicate decreased fall risk.     Baseline  12 seconds, improved but not to goal    Time  4    Period  Weeks    Status  Partially Met      PT SHORT TERM GOAL #4   Title  Pt will improve FGA to >/=21/30 in order to indicate decreased fall risk.     Baseline  20/30 improved but not to goal    Time  4    Period  Weeks    Status  Partially Met      PT SHORT TERM GOAL #5   Title   Pt will  perform 6MWT and improve distance by 150' in order to indicate improved functional endurance.     Baseline  985'    Time  4    Period  Weeks    Status  Not Met      PT SHORT TERM GOAL #6   Title  Pt will ambulate up to 500' over unlevel paved outdoor surfaces w/o AD at S level in order to indicate improved community mobility.     Baseline  met     Time  4    Period  Weeks    Status  Achieved        PT Long Term Goals - 05/05/18 1058      PT LONG TERM GOAL #1   Title  Pt will be independent with final HEP in order to indicate decreased fall risk and improved functional mobility.  (Target Date: 06/04/2018)    Time  8    Period  Weeks    Status  New      PT LONG TERM GOAL #2   Title  Pt will improve FGA to >/=24/30 in order to indicate decreased fall risk.      Time  8    Period  Weeks    Status  New      PT LONG TERM GOAL #3   Title  Pt will improve 6MWT by 300' from baseline in order to indicate improved functional endurance.     Time  8    Period  Weeks    Status  New      PT LONG TERM GOAL #4   Title  Pt will ambulate up to 1000' over varying outdoor surfaces (grass, inclines/declines, curb/ramp) without AD at mod I level in order to indicate improved community mobility.      Time  8    Period  Weeks    Status  New      PT LONG TERM GOAL #5   Title  Pt will demonstrate ability to simulate bowling without overt LOB in order to return to leisure activity.     Time  8    Period  Weeks    Status  New            Plan - 05/09/18 1350    Clinical Impression Statement  Skilled session focused on addressing remaining STG for outdoor gait.  Pt met goal, however still note L foot catch and L hip weakness with fatigue.  Therefore trialed use of foot up brace during session.  Do note improvement in foot clearance and pt reports he likes this over PLS AFO, therefore provided handout on how to order on [place].      Rehab Potential  Good    Clinical Impairments  Affecting Rehab Potential  Pt motivated to improve mobility    PT Frequency  2x / week    PT Duration  8 weeks    PT Treatment/Interventions  ADLs/Self Care Home Management;Aquatic Therapy;Electrical Stimulation;Gait training;Stair training;DME Instruction;Functional mobility training;Therapeutic activities;Therapeutic exercise;Balance training;Neuromuscular re-education;Patient/family education;Orthotic Fit/Training;Passive range of motion;Vestibular    PT Next Visit Plan  MONITOR Vitals on RUE, especially HR-runs pretty low.  Continue gait and balance with foot up brace, L hip strength!!    Consulted and Agree with Plan of Care  Patient       Patient will benefit from skilled therapeutic intervention in order to improve the following deficits and impairments:  Abnormal gait, Decreased activity tolerance, Decreased balance, Decreased endurance, Decreased mobility,  Decreased range of motion, Impaired perceived functional ability, Impaired flexibility, Impaired UE functional use, Postural dysfunction  Visit Diagnosis: Hemiplegia and hemiparesis following cerebral infarction affecting left non-dominant side (HCC)  Unsteadiness on feet  Other abnormalities of gait and mobility     Problem List Patient Active Problem List   Diagnosis Date Noted  . Abnormal peripheral vision, left 01/21/2018  . Anemia, chronic disease   . Left hemiparesis ([place])   . Acute ischemic cerebrovascular accident (CVA) involving right middle cerebral artery territory [facility])   . CKD (chronic kidney disease), stage III ([place])   . Peripheral edema   . Stroke (cerebrum) ([place]) 01/05/2018  . History of gout   . AKI (acute kidney injury) ([place])   . Stage 3 chronic kidney disease ([place])   . Anemia of chronic disease   . Left-sided weakness   . Osteoarthritis   . OSA (obstructive sleep apnea)   . Noncompliance with CPAP treatment   . History of DVT (deep vein thrombosis)   . History of CVA (cerebrovascular accident)    . Dysphagia, post-stroke   . Sinus tachycardia   . Acute blood loss anemia   . Acute CVA (cerebrovascular accident) ([place]) 01/01/2018  . Hypertensive urgency 01/01/2018  . CKD (chronic kidney disease) stage 2, GFR 60-89 ml/min 01/01/2018  . 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 Generalized weakness   . Sepsis ([place]) 12/10/2016  . Bacteremia due to Streptococcus 12/10/2016  . Lactic acidosis 12/10/2016  . Elevated troponin 12/10/2016  . Renal insufficiency 12/10/2016  . Hypophosphatemia 12/10/2016  . Left leg swelling 12/10/2016  . Febrile illness 12/09/2016  . Febrile illness, acute   . OBESITY, UNSPECIFIED 05/31/2009  . Hyperlipidemia 05/30/2009  . Obstructive sleep apnea 05/30/2009  . Essential hypertension 05/30/2009  . CAD (coronary artery disease) 05/30/2009    Cameron Sprang, PT, MPT [facility] [address] [place], [place], [address] Phone: [phone]   Fax:  [phone] 05/09/18, 1:54 PM  Name: [name] MRN: [id number] Date of Birth: [birth year]

## 2018-05-11 ENCOUNTER — Ambulatory Visit: Payer: Medicare Other | Admitting: Physical Therapy

## 2018-05-11 ENCOUNTER — Encounter: Payer: Medicare Other | Admitting: Occupational Therapy

## 2018-05-16 ENCOUNTER — Encounter: Payer: Self-pay | Admitting: Occupational Therapy

## 2018-05-16 ENCOUNTER — Encounter: Payer: Self-pay | Admitting: Rehabilitation

## 2018-05-16 ENCOUNTER — Ambulatory Visit (INDEPENDENT_AMBULATORY_CARE_PROVIDER_SITE_OTHER): Payer: Medicare Other | Admitting: *Deleted

## 2018-05-16 ENCOUNTER — Ambulatory Visit: Payer: Medicare Other | Admitting: Rehabilitation

## 2018-05-16 ENCOUNTER — Ambulatory Visit: Payer: Medicare Other | Admitting: Occupational Therapy

## 2018-05-16 ENCOUNTER — Other Ambulatory Visit: Payer: Self-pay

## 2018-05-16 DIAGNOSIS — I69354 Hemiplegia and hemiparesis following cerebral infarction affecting left non-dominant side: Secondary | ICD-10-CM | POA: Diagnosis not present

## 2018-05-16 DIAGNOSIS — R2681 Unsteadiness on feet: Secondary | ICD-10-CM

## 2018-05-16 DIAGNOSIS — M25612 Stiffness of left shoulder, not elsewhere classified: Secondary | ICD-10-CM

## 2018-05-16 DIAGNOSIS — R2689 Other abnormalities of gait and mobility: Secondary | ICD-10-CM

## 2018-05-16 DIAGNOSIS — M25642 Stiffness of left hand, not elsewhere classified: Secondary | ICD-10-CM

## 2018-05-16 DIAGNOSIS — R278 Other lack of coordination: Secondary | ICD-10-CM

## 2018-05-16 DIAGNOSIS — I63511 Cerebral infarction due to unspecified occlusion or stenosis of right middle cerebral artery: Secondary | ICD-10-CM

## 2018-05-16 NOTE — Therapy (Signed)
[facility] [address] [place], [place], [address] Phone: [phone]   Fax:  [phone]  Physical Therapy Treatment  Patient Details  Name: [name] MRN: [id number] Date of Birth: 10-18-1940 Referring Provider (PT): Alysia Penna, MD   Encounter Date: 05/16/2018  PT End of Session - 05/16/18 1310    Visit Number  7    Number of Visits  17    Date for PT Re-Evaluation  07/04/18   POC written for 90 days, plan to see for 60 days   Authorization Type  Medicare-Needs 10th visit progress note    PT Start Time  1115   pt missed 15 mins w/ belt and restroom   PT Stop Time  1145    PT Time Calculation (min)  30 min    Activity Tolerance  Patient tolerated treatment well    Behavior During Therapy  [facility] for tasks assessed/performed       Past Medical History:  Diagnosis Date  . CAD (coronary artery disease)    s/p cath in 2005 and CABG x4  . Cataract   . DJD (degenerative joint disease)   . DJD (degenerative joint disease)   . Fluid retention    mild  . H/O: gout   . History of blood clots   . HTN (hypertension)   . Hyperlipidemia   . Hyperlipidemia   . Myocardial infarction ([place]) 2009  . Obesity    with reduction  . OSA (obstructive sleep apnea)    AHI 98/hr  . Renal insufficiency   . Sleep apnea   . Stroke [facility]) 2000    Past Surgical History:  Procedure Laterality Date  . CORONARY ARTERY BYPASS GRAFT  2005   LIMA to LAD, SVG to OM1 and OM 2, RCA.   [name] left inguinal hernia repair    . LOOP RECORDER INSERTION N/A 01/03/2018   Procedure: LOOP RECORDER INSERTION;  Surgeon: Evans Lance, MD;  Location: [facility] CV LAB;  Service: Cardiovascular;  Laterality: N/A;  . TEE WITHOUT CARDIOVERSION N/A 12/11/2016   Procedure: TRANSESOPHAGEAL ECHOCARDIOGRAM (TEE);  Surgeon: [name], MD;  Location: [facility] ENDOSCOPY;  Service: Cardiovascular;  Laterality: N/A;  . TEE WITHOUT CARDIOVERSION N/A  01/03/2018   Procedure: TRANSESOPHAGEAL ECHOCARDIOGRAM (TEE);  Surgeon: Dorothy Spark, MD;  Location: [facility] ENDOSCOPY;  Service: Cardiovascular;  Laterality: N/A;    There were no vitals filed for this visit.  Subjective Assessment - 05/16/18 1303    Subjective  Pt late to session, PT provided help making new hole in belt and then needing to use restroom.  Otherwise, no changes since last session, doing well.     Pertinent History  CAD s/p CABG 2005, HTN, HLD, OSA, DVTs    Limitations  Walking;House hold activities    Patient Stated Goals  "I would like to be able to walk outside better."     Currently in Pain?  No/denies                       [facility] Adult PT Treatment/Exercise - 05/16/18 0001      Ambulation/Gait   Ambulation/Gait  Yes    Ambulation/Gait Assistance  5: Supervision    Ambulation/Gait Assistance Details  Performed gait in between strengthening tasks as active rest without AD and without foot up brace.  Cues for improved R heel strike and upright posture.  Continue to note L hip weakness during gait.  Ambulation Distance (Feet)  200 Feet    Assistive device  None    Gait Pattern  Step-through pattern;Decreased arm swing - left;Decreased stance time - left;Decreased stride length;Decreased dorsiflexion - left;Decreased weight shift to left;Trendelenburg;Lateral hip instability;Trunk flexed;Wide base of support;Poor foot clearance - left    Ambulation Surface  Level;Indoor      Exercises   Exercises  Knee/Hip      Knee/Hip Exercises: Standing   Hip Flexion  Stengthening;Both;1 set;10 reps    Hip Flexion Limitations  Performed hip flex/extension (swing leg forward/backwards) while maintaining stance on opposite LE x 10 reps on each side with single UE support.  Pt with difficulty moving RLE when in stance on LLE due to hip weakness.     Hip Abduction  Stengthening;Right;1 set;10 reps    Abduction Limitations  with single UE support    Forward Step Up   Both;1 set;15 reps;Hand Hold: 1;Step Height: 4"    Forward Step Up Limitations  with UE support from outside of //bars.  Cues to switch between R and L foot.  Performed forward step ups/down following by stepping up/down backwards.    Functional Squat  1 set;15 reps    Functional Squat Limitations  with BUE support and cues for correct technique    Other Standing Knee Exercises  Continued with L step up on aerobic step followed by lifting RLE into march position with single UE support x 10 reps. Note marked difficutly once at end of reps and pt unable to go from floor to march without stopping on step first.        Knee/Hip Exercises: Seated   Hamstring Curl  Strengthening;Both    Hamstring Limitations  Performed hamstring curls while pulling himself in rolling chair x 115' with single rest break.      Sit to Sand  1 set;without UE support;10 reps   Cues for controlled descent            PT Education - 05/16/18 1310    Education Details  purpose of strengthening exercises.     Person(s) Educated  Patient    Methods  Explanation    Comprehension  Verbalized understanding       PT Short Term Goals - 05/09/18 1351      PT SHORT TERM GOAL #1   Title  Pt will initiate HEP in order to indicate improved functional mobility and balance.  (Target Date: 05/05/2018)    Time  4    Period  Weeks    Status  On-going    Target Date  05/05/18      PT SHORT TERM GOAL #2   Title  Pt will improve gait speed to >/=3.60 ft/sec in order to indicate improved efficiency of gait.     Baseline  3.48 ft/sec, improved but not to goal    Time  4    Period  Weeks    Status  Partially Met      PT SHORT TERM GOAL #3   Title  Pt will improve 5TSS to </=11 secs without UE support with decreased BOS in order to indicate decreased fall risk.     Baseline  12 seconds, improved but not to goal    Time  4    Period  Weeks    Status  Partially Met      PT SHORT TERM GOAL #4   Title  Pt will improve FGA to  >/=21/30 in order to indicate decreased fall risk.  Baseline  20/30 improved but not to goal    Time  4    Period  Weeks    Status  Partially Met      PT SHORT TERM GOAL #5   Title  Pt will perform 6MWT and improve distance by 150' in order to indicate improved functional endurance.     Baseline  985'    Time  4    Period  Weeks    Status  Not Met      PT SHORT TERM GOAL #6   Title  Pt will ambulate up to 500' over unlevel paved outdoor surfaces w/o AD at S level in order to indicate improved community mobility.     Baseline  met     Time  4    Period  Weeks    Status  Achieved        PT Long Term Goals - 05/05/18 1058      PT LONG TERM GOAL #1   Title  Pt will be independent with final HEP in order to indicate decreased fall risk and improved functional mobility.  (Target Date: 06/04/2018)    Time  8    Period  Weeks    Status  New      PT LONG TERM GOAL #2   Title  Pt will improve FGA to >/=24/30 in order to indicate decreased fall risk.      Time  8    Period  Weeks    Status  New      PT LONG TERM GOAL #3   Title  Pt will improve 6MWT by 300' from baseline in order to indicate improved functional endurance.     Time  8    Period  Weeks    Status  New      PT LONG TERM GOAL #4   Title  Pt will ambulate up to 1000' over varying outdoor surfaces (grass, inclines/declines, curb/ramp) without AD at mod I level in order to indicate improved community mobility.      Time  8    Period  Weeks    Status  New      PT LONG TERM GOAL #5   Title  Pt will demonstrate ability to simulate bowling without overt LOB in order to return to leisure activity.     Time  8    Period  Weeks    Status  New            Plan - 05/16/18 1310    Clinical Impression Statement  Session limited to 30 mins due to pt making new hole in belt and using restroom.  Remainder of session focused on LE strengthening and endurance with emphasis on LLE strengthening.      Rehab Potential  Good     Clinical Impairments Affecting Rehab Potential  Pt motivated to improve mobility    PT Frequency  2x / week    PT Duration  8 weeks    PT Treatment/Interventions  ADLs/Self Care Home Management;Aquatic Therapy;Electrical Stimulation;Gait training;Stair training;DME Instruction;Functional mobility training;Therapeutic activities;Therapeutic exercise;Balance training;Neuromuscular re-education;Patient/family education;Orthotic Fit/Training;Passive range of motion;Vestibular    PT Next Visit Plan  MONITOR Vitals on RUE, especially HR-runs pretty low.  Continue gait and balance with foot up brace, L hip strength, endurance on stepper (do intervals and check HR throughout)    Consulted and Agree with Plan of Care  Patient       Patient will benefit from skilled therapeutic  intervention in order to improve the following deficits and impairments:  Abnormal gait, Decreased activity tolerance, Decreased balance, Decreased endurance, Decreased mobility, Decreased range of motion, Impaired perceived functional ability, Impaired flexibility, Impaired UE functional use, Postural dysfunction  Visit Diagnosis: Hemiplegia and hemiparesis following cerebral infarction affecting left non-dominant side (HCC)  Unsteadiness on feet  Other abnormalities of gait and mobility     Problem List Patient Active Problem List   Diagnosis Date Noted  . Abnormal peripheral vision, left 01/21/2018  . Anemia, chronic disease   . Left hemiparesis ([place])   . Acute ischemic cerebrovascular accident (CVA) involving right middle cerebral artery territory [facility])   . CKD (chronic kidney disease), stage III ([place])   . Peripheral edema   . Stroke (cerebrum) ([place]) 01/05/2018  . History of gout   . AKI (acute kidney injury) ([place])   . Stage 3 chronic kidney disease ([place])   . Anemia of chronic disease   . Left-sided weakness   . Osteoarthritis   . OSA (obstructive sleep apnea)   . Noncompliance with CPAP treatment   .  History of DVT (deep vein thrombosis)   . History of CVA (cerebrovascular accident)   . Dysphagia, post-stroke   . Sinus tachycardia   . Acute blood loss anemia   . Acute CVA (cerebrovascular accident) ([place]) 01/01/2018  . Hypertensive urgency 01/01/2018  . CKD (chronic kidney disease) stage 2, GFR 60-89 ml/min 01/01/2018  . 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 Generalized weakness   . Sepsis ([place]) 12/10/2016  . Bacteremia due to Streptococcus 12/10/2016  . Lactic acidosis 12/10/2016  . Elevated troponin 12/10/2016  . Renal insufficiency 12/10/2016  . Hypophosphatemia 12/10/2016  . Left leg swelling 12/10/2016  . Febrile illness 12/09/2016  . Febrile illness, acute   . OBESITY, UNSPECIFIED 05/31/2009  . Hyperlipidemia 05/30/2009  . Obstructive sleep apnea 05/30/2009  . Essential hypertension 05/30/2009  . CAD (coronary artery disease) 05/30/2009    Cameron Sprang, PT, MPT [facility] [address] [place], [place], [address] Phone: [phone]   Fax:  [phone] 05/16/18, 1:13 PM  Name: [name] MRN: [id number] Date of Birth: 1941-02-17

## 2018-05-16 NOTE — Therapy (Signed)
[facility] [address] [place], [place], [address] Phone: [phone]   Fax:  [phone]  Occupational Therapy Treatment  Patient Details  Name: [name] MRN: [id number] Date of Birth: 1941/01/20 Referring Provider (OT): Dr. Alysia Penna   Encounter Date: 05/16/2018  OT End of Session - 05/16/18 1036    Visit Number  7    Number of Visits  25    Date for OT Re-Evaluation  07/04/18    Authorization Type  [employer] Medicare     Authorization - Visit Number  7    Authorization - Number of Visits  10    OT Start Time  1025    OT Stop Time  1104    OT Time Calculation (min)  39 min    Equipment Utilized During Treatment  **FOTO    Activity Tolerance  Patient tolerated treatment well    Behavior During Therapy  [facility] for tasks assessed/performed       Past Medical History:  Diagnosis Date  . CAD (coronary artery disease)    s/p cath in 2005 and CABG x4  . Cataract   . DJD (degenerative joint disease)   . DJD (degenerative joint disease)   . Fluid retention    mild  . H/O: gout   . History of blood clots   . HTN (hypertension)   . Hyperlipidemia   . Hyperlipidemia   . Myocardial infarction ([place]) 2009  . Obesity    with reduction  . OSA (obstructive sleep apnea)    AHI 98/hr  . Renal insufficiency   . Sleep apnea   . Stroke [facility]) 2000    Past Surgical History:  Procedure Laterality Date  . CORONARY ARTERY BYPASS GRAFT  2005   LIMA to LAD, SVG to OM1 and OM 2, RCA.   [name] left inguinal hernia repair    . LOOP RECORDER INSERTION N/A 01/03/2018   Procedure: LOOP RECORDER INSERTION;  Surgeon: Evans Lance, MD;  Location: [facility] CV LAB;  Service: Cardiovascular;  Laterality: N/A;  . TEE WITHOUT CARDIOVERSION N/A 12/11/2016   Procedure: TRANSESOPHAGEAL ECHOCARDIOGRAM (TEE);  Surgeon: [name], MD;  Location: [facility] ENDOSCOPY;  Service: Cardiovascular;  Laterality: N/A;  . TEE WITHOUT CARDIOVERSION  N/A 01/03/2018   Procedure: TRANSESOPHAGEAL ECHOCARDIOGRAM (TEE);  Surgeon: Dorothy Spark, MD;  Location: [facility] ENDOSCOPY;  Service: Cardiovascular;  Laterality: N/A;    There were no vitals filed for this visit.  Subjective Assessment - 05/16/18 1035    Subjective   exercises going well at home    Pertinent History  Hemiparesis affecting L side due to CVA.  PMH:  CAD, s/p CABGx4 2005, MI, HTN, dylipidemia, gout, arrhythmia, OSA, DJD, loop recorder    Patient Stated Goals  improve grip/use of L hand, to be able to grip steering wheel and close door in order to drive again    Currently in Pain?  No/denies    Pain Onset  1 to 4 weeks ago       Arm bike x 5 min level 1 for reciprocal movement/conditioning without rest.  Neuro re-ed:  In supine, BUEs closed-chain/AAROM shoulder flex and chest press and shoulder abduction with ball with min-mod facilitation for normal movement patterns.  In sitting, wt. Bearing though hand on mat with body on arm movements for incr scapular stability, incr tricep activation, min cues  Standing, diagonal hemiglide for AAROM shoulder flex and abduction with min cueing for compensation  OT Education - 05/16/18 1544    Education Details  added shoulder ext/scapular retraction in standing with cane and red putty HEP--see pt instructions    Person(s) Educated  Patient    Methods  Explanation;Demonstration;Handout;Verbal cues;Tactile cues    Comprehension  Verbalized understanding;Returned demonstration;Verbal cues required       OT Short Term Goals - 05/16/18 1545      OT SHORT TERM GOAL #1   Title  Pt will be independent with initial HEP.--check STGs 05/17/18    Time  6    Period  Weeks    Status  Achieved      OT SHORT TERM GOAL #2   Title  Pt will demo at least 110* PROM L shoulder flex with no pain to assist with functional reach.    Time  6    Period  Weeks    Status  Achieved   05/09/18:  met at approx this level     OT SHORT TERM  GOAL #3   Title  Pt will be able to use L hand to assist with tying shoes, pulling up pants/socks.    Time  6    Period  Weeks    Status  On-going   05/09/18 met with pulling up pants/socks, not yet met with tying shoes     OT SHORT TERM GOAL #4   Title  Pt will demo at least 80* L shoulder flex AROM with min compensation to be able to retrieve light object.    Time  6    Period  Weeks    Status  New      OT SHORT TERM GOAL #5   Title  Pt will demo at least 90% gross composite finger flex to assist with grasping objects with L hand.    Time  6    Period  Weeks    Status  Achieved   05/16/18     OT SHORT TERM GOAL #6   Title  Pt will perform simple home maintenance task mod I.    Time  6    Period  Weeks    Status  New        OT Long Term Goals - 04/05/18 1358      OT LONG TERM GOAL #1   Title  Pt will be independent with updated HEP.--check LTGs 07/04/18    Time  12    Period  Weeks    Status  New      OT LONG TERM GOAL #2   Title  Pt will demo at least 20lbs L grip strength to assist in opening jars/containers.    Time  12    Period  Weeks    Status  New      OT LONG TERM GOAL #3   Title  Pt will perform simple cooking task with supervision.    Time  12    Period  Weeks    Status  New      OT LONG TERM GOAL #4   Title  Pt will improve functional reaching/coordination as shown by improving score on box and blocks test by at least 10    Baseline  L-16 blocks    Time  6    Period  Weeks    Status  New      OT LONG TERM GOAL #5   Title  Pt will demo improved L hand coordination as shown by completing 9-hole peg test in less than 2  min.    Baseline  unable    Time  6    Period  Weeks    Status  New      OT LONG TERM GOAL #6   Title  Pt will demo at least 90* L shoulder flex to retrieve lightweight object with no pain.    Time  6    Period  Weeks    Status  New            Plan - 05/16/18 1037    Clinical Impression Statement  Pt slowly progressing  towards goals with improving grip and LUE functional use.    Occupational Profile and client history currently impacting functional performance  Pt was independent prior to CVA.  Pt is now performing BADLs mod I with difficulty using LUE.  Pt is not currently performing previous IADLs like driving, cooking, home maintenance tasks or leisure tasks such as bowling.    Occupational performance deficits (Please refer to evaluation for details):  ADL's;IADL's;Leisure;Social Participation    Rehab Potential  Good    OT Frequency  2x / week    OT Duration  12 weeks    OT Treatment/Interventions  Self-care/ADL training;Electrical Stimulation;Therapeutic exercise;Patient/family education;Splinting;Neuromuscular education;Paraffin;Moist Heat;Aquatic Therapy;Fluidtherapy;Therapist, nutritional;Therapeutic activities;Balance training;Passive range of motion;Manual Therapy;DME and/or AE instruction;Ultrasound;Cryotherapy;Contrast Bath    Plan  check remaining STGs; continue with neuro re-ed, LUE functional use,  wt. bearing    Consulted and Agree with Plan of Care  Patient       Patient will benefit from skilled therapeutic intervention in order to improve the following deficits and impairments:     Visit Diagnosis: Hemiplegia and hemiparesis following cerebral infarction affecting left non-dominant side (HCC)  Unsteadiness on feet  Other abnormalities of gait and mobility  Stiffness of left shoulder, not elsewhere classified  Other lack of coordination  Stiffness of left hand, not elsewhere classified    Problem List Patient Active Problem List   Diagnosis Date Noted  . Abnormal peripheral vision, left 01/21/2018  . Anemia, chronic disease   . Left hemiparesis ([place])   . Acute ischemic cerebrovascular accident (CVA) involving right middle cerebral artery territory [facility])   . CKD (chronic kidney disease), stage III ([place])   . Peripheral edema   . Stroke (cerebrum) ([place]) 01/05/2018  .  History of gout   . AKI (acute kidney injury) ([place])   . Stage 3 chronic kidney disease ([place])   . Anemia of chronic disease   . Left-sided weakness   . Osteoarthritis   . OSA (obstructive sleep apnea)   . Noncompliance with CPAP treatment   . History of DVT (deep vein thrombosis)   . History of CVA (cerebrovascular accident)   . Dysphagia, post-stroke   . Sinus tachycardia   . Acute blood loss anemia   . Acute CVA (cerebrovascular accident) ([place]) 01/01/2018  . Hypertensive urgency 01/01/2018  . CKD (chronic kidney disease) stage 2, GFR 60-89 ml/min 01/01/2018  . 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 Generalized weakness   . Sepsis ([place]) 12/10/2016  . Bacteremia due to Streptococcus 12/10/2016  . Lactic acidosis 12/10/2016  . Elevated troponin 12/10/2016  . Renal insufficiency 12/10/2016  . Hypophosphatemia 12/10/2016  . Left leg swelling 12/10/2016  . Febrile illness 12/09/2016  . Febrile illness, acute   . OBESITY, UNSPECIFIED 05/31/2009  . Hyperlipidemia 05/30/2009  . Obstructive sleep apnea 05/30/2009  . Essential hypertension 05/30/2009  . CAD (coronary artery disease) 05/30/2009    [facility] 05/16/2018,  3:46 PM  [facility] [address] [place], [place], [address] Phone: [phone]   Fax:  [phone]  Name: [name] MRN: [id number] Date of Birth: [birth year]   Vianne Bulls, OTR/L [facility] [address] [place],   [address] [phone] phone [phone] 05/16/18 3:47 PM

## 2018-05-16 NOTE — Patient Instructions (Signed)
   ROM: Extension - Wand (Standing)   Stand holding wand behind back. Raise arms as far as possible WHILE KEEPING HEAD/CHEST UP.  SQUEEZE SHOULDER BLADES TOGETHER.  PALMS FACING FORWARD. Repeat 10-15 times per set.  Do 2 sessions per day.   11. Grip Strengthening (Resistive Putty)   Squeeze putty using thumb and all fingers. Repeat 15 times. Do 1-2 sessions per day.   Extension (Assistive Putty)   Roll putty back and forth, being sure to use all fingertips. Repeat 3 times. Do 1-2 sessions per day.  Then pinch as below.   Palmar Pinch Strengthening (Resistive Putty)   Pinch putty between thumb and each fingertip in turn after rolling out

## 2018-05-17 ENCOUNTER — Other Ambulatory Visit: Payer: Self-pay | Admitting: Internal Medicine

## 2018-05-19 LAB — CUP PACEART REMOTE DEVICE CHECK
Date Time Interrogation Session: 20200315214025
MDC IDC PG IMPLANT DT: 20191104

## 2018-05-23 ENCOUNTER — Ambulatory Visit: Payer: Medicare Other | Admitting: Physical Therapy

## 2018-05-23 ENCOUNTER — Ambulatory Visit: Payer: Medicare Other | Admitting: Occupational Therapy

## 2018-05-23 ENCOUNTER — Telehealth: Payer: Self-pay | Admitting: Physical Therapy

## 2018-05-23 NOTE — Progress Notes (Signed)
Carelink Summary Report / Loop Recorder 

## 2018-05-23 NOTE — Telephone Encounter (Signed)
[name] was contacted today regarding the temporary closing of [facility] due to Covid-19.  Therapist discussed:  Continuation of HEP and target re-open date.  Pt did not have any questions of concerns.  Patient IS NOT interested in further information for an e-visit, virtual check in, or telehealth visit, if those services become available.    [facility] will follow up with patients when we are able to resume care.  Rico Junker, PT, DPT 05/23/18    3:37 PM    [facility] [address] [place], [place]  [address] Phone:  [phone] Fax:  [phone] \

## 2018-05-26 ENCOUNTER — Other Ambulatory Visit: Payer: Self-pay | Admitting: Internal Medicine

## 2018-05-30 ENCOUNTER — Ambulatory Visit: Payer: Medicare Other | Admitting: Rehabilitation

## 2018-05-30 ENCOUNTER — Encounter: Payer: Medicare Other | Admitting: Occupational Therapy

## 2018-06-06 ENCOUNTER — Ambulatory Visit (HOSPITAL_BASED_OUTPATIENT_CLINIC_OR_DEPARTMENT_OTHER): Payer: Medicare Other | Admitting: Physical Medicine & Rehabilitation

## 2018-06-06 ENCOUNTER — Encounter: Payer: Medicare Other | Attending: Registered Nurse

## 2018-06-06 ENCOUNTER — Encounter: Payer: Self-pay | Admitting: Physical Medicine & Rehabilitation

## 2018-06-06 ENCOUNTER — Other Ambulatory Visit: Payer: Self-pay

## 2018-06-06 VITALS — BP 139/91 | HR 67

## 2018-06-06 DIAGNOSIS — Z951 Presence of aortocoronary bypass graft: Secondary | ICD-10-CM | POA: Insufficient documentation

## 2018-06-06 DIAGNOSIS — I63511 Cerebral infarction due to unspecified occlusion or stenosis of right middle cerebral artery: Secondary | ICD-10-CM | POA: Insufficient documentation

## 2018-06-06 DIAGNOSIS — I69354 Hemiplegia and hemiparesis following cerebral infarction affecting left non-dominant side: Secondary | ICD-10-CM

## 2018-06-06 DIAGNOSIS — Z87891 Personal history of nicotine dependence: Secondary | ICD-10-CM | POA: Insufficient documentation

## 2018-06-06 DIAGNOSIS — G4733 Obstructive sleep apnea (adult) (pediatric): Secondary | ICD-10-CM | POA: Insufficient documentation

## 2018-06-06 DIAGNOSIS — Z86718 Personal history of other venous thrombosis and embolism: Secondary | ICD-10-CM | POA: Insufficient documentation

## 2018-06-06 DIAGNOSIS — N183 Chronic kidney disease, stage 3 (moderate): Secondary | ICD-10-CM | POA: Insufficient documentation

## 2018-06-06 DIAGNOSIS — I129 Hypertensive chronic kidney disease with stage 1 through stage 4 chronic kidney disease, or unspecified chronic kidney disease: Secondary | ICD-10-CM | POA: Insufficient documentation

## 2018-06-06 DIAGNOSIS — I2510 Atherosclerotic heart disease of native coronary artery without angina pectoris: Secondary | ICD-10-CM | POA: Insufficient documentation

## 2018-06-06 DIAGNOSIS — Z8249 Family history of ischemic heart disease and other diseases of the circulatory system: Secondary | ICD-10-CM | POA: Insufficient documentation

## 2018-06-06 DIAGNOSIS — I252 Old myocardial infarction: Secondary | ICD-10-CM | POA: Insufficient documentation

## 2018-06-06 DIAGNOSIS — E785 Hyperlipidemia, unspecified: Secondary | ICD-10-CM | POA: Insufficient documentation

## 2018-06-06 NOTE — Progress Notes (Signed)
Subjective:    Patient ID: [name], male    DOB: [birth year], 78 y.o.   MRN: [id number] 78 year old male with history of CAD, DJD, OSA, DVTs, CVA who was admitted on 01/01/18 with acuteonset of left facial weakness, dysarthria, left-sided weakness and fall. CT perfusion done revealing small acute right frontal/MCA penumbra and CTA head neck showed moderate cerebral artery atherosclerosis with patient's mid stenosis right M2 origin. MRI brain showed multiple right CVA. Patient has had issues with lethargy as well as difficulty handling secretion and he was placed on dysphagia 1 diet with nectar liquids. Carotid Dopplers done showing large soft plaque and right ICA bifurcation however no significant stenosis noted. TEE was negative for thrombus or PFO and loop recorder placed by Dr. Lovena Le. Neurology recommended aspirin Plavix for embolic stroke felt to be from a right-ICA soft plaqueversus cardioembolic source. Patient with residual left-sided weakness with leftinattention as well as cognitive deficitsaffecting ADLs and mobility. CIR was recommended for follow-up therapy Admit date:01/05/2018 Discharge date:01/22/2018 HPI Had left shoulder injection which helped last visit Mod I dressing and bathing Uses cane in yard, no falls No new problems Feels like left side is ~80% of right side strength The patient had documented a flutter on loop recorder monitoring 03/10/2018 and was switched to Eliquis at that time by cardiology VIRTUAL VISIT-patient is at home Pain Inventory Average Pain 0 Pain Right Now 0 My pain is no pain  In the last 24 hours, has pain interfered with the following? General activity 0 Relation with others 0 Enjoyment of life 0 What TIME of day is your pain at its worst? no pain Sleep (in general) NA  Pain is worse with: no pain Pain improves with: no pain Relief from Meds: no pain  Mobility walk without assistance ability to climb steps?  yes do you  drive?  yes  Function disabled: date disabled . I need assistance with the following:  meal prep, household duties and shopping  Neuro/Psych weakness  Prior Studies Any changes since last visit?  yes Had nose bleed last week requiring call to 911, has been adressed   Physicians involved in your care Any changes since last visit?  no   Family History  Problem Relation Age of Onset  . Gout Mother   . Stroke Mother   . Coronary artery disease Mother   . Hypertension Mother   . Arthritis Mother   . Coronary artery disease Father   . Arthritis Father   . Gout Father   . Stroke Father   . Hypertension Father   . Coronary artery disease Brother   . Arthritis Brother   . Gout Brother   . Stroke Brother   . Hypertension Brother   . Coronary artery disease Brother   . Arthritis Brother   . Gout Brother   . Stroke Brother   . Hypertension Brother   . Coronary artery disease Other        significant in multiple family members   Social History   Socioeconomic History  . Marital status: Married    Spouse name: [name]  . Number of children: Not on file  . Years of education: Not on file  . Highest education level: Not on file  Occupational History  . Not on file  Social Needs  . Financial resource strain: Not on file  . Food insecurity:    Worry: Not on file    Inability: Not on file  . Transportation needs:  Medical: Not on file    Non-medical: Not on file  Tobacco Use  . Smoking status: Former Smoker    Types: Cigars  . Smokeless tobacco: Never Used  . Tobacco comment: used to smoke cigars, 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, none since  Substance and Sexual Activity  . Alcohol use: No  . Drug use: No  . Sexual activity: Not on file  Lifestyle  . Physical activity:    Days per week: Not on file    Minutes per session: Not on file  . Stress: Not on file  Relationships  . Social connections:    Talks on phone: Not on file    Gets together: Not on file     Attends religious service: Not on file    Active member of club or organization: Not on file    Attends meetings of clubs or organizations: Not on file    Relationship status: Not on file  Other Topics Concern  . Not on file  Social History Narrative  . Not on file   Past Surgical History:  Procedure Laterality Date  . CORONARY ARTERY BYPASS GRAFT  2005   LIMA to LAD, SVG to OM1 and OM 2, RCA.   [name] left inguinal hernia repair    . LOOP RECORDER INSERTION N/A 01/03/2018   Procedure: LOOP RECORDER INSERTION;  Surgeon: Evans Lance, MD;  Location: [facility];  Service: Cardiovascular;  Laterality: N/A;  . TEE WITHOUT CARDIOVERSION N/A 12/11/2016   Procedure: TRANSESOPHAGEAL ECHOCARDIOGRAM (TEE);  Surgeon: [name], MD;  Location: [facility] END[facility];  Service: Cardiovascular;  Laterality: N/A;  . TEE WITHOUT CARDIOVERSION N/A 01/03/2018   Procedure: TRANSESOPHAGEAL ECHOCARDIOGRAM (TEE);  Surgeon: Dorothy Spark, MD;  Location: [facility] ENDOSCOPY;  Service: Cardiovascular;  Laterality: N/A;   Past Medical History:  Diagnosis Date  . CAD (coronary artery disease)    s/p cath in 2005 and CABG x4  . Cataract   . DJD (degenerative joint disease)   . DJD (degenerative joint disease)   . Fluid retention    mild  . H/O: gout   . History of blood clots   . HTN (hypertension)   . Hyperlipidemia   . Hyperlipidemia   . Myocardial infarction ([place]) 2009  . Obesity    with reduction  . OSA (obstructive sleep apnea)    AHI 98/hr  . Renal insufficiency   . Sleep apnea   . Stroke ([place]) 2000   BP (!) 139/91 Comment: self check- reported  Pulse 67 Comment: self check- reported  Opioid Risk Score:   Fall Risk Score:  `1  Depression screen PHQ 2/9  Depression screen [facility] 2/9 06/06/2018 05/02/2018 01/13/2017  Decreased Interest 0 0 0  Down, Depressed, Hopeless 0 0 0  PHQ - 2 Score 0 0 0   Review of Systems  Constitutional: Negative.   HENT: Positive for nosebleeds.   Eyes:  Negative.   Respiratory: Negative.   Cardiovascular: Negative.   Gastrointestinal: Negative.   Endocrine: Negative.   Genitourinary: Negative.   Musculoskeletal: Negative.   Skin: Negative.   Allergic/Immunologic: Negative.   Neurological: Positive for weakness.  Hematological: Bruises/bleeds easily.  Psychiatric/Behavioral: Negative.   All other systems reviewed and are negative.      Objective:   Physical Exam  Speech without dysarthria or aphasia Virtual visit no physical exam      Assessment & Plan:  1.  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  Overall making good recovery. He was attending outpatient therapy and at this is on hold due  to the coronavirus epidemic. We discussed recommendations for rehab follow-up in 3 months. We discussed his medications he has no questions Does not appear to be any safety issues at home at the current time. We discussed that his medications will make him more likely to have nosebleeds, namely Eliquis use for stroke prophylaxis He has been referred by his primary physician to ENT to address this issue

## 2018-06-15 ENCOUNTER — Telehealth: Payer: Self-pay

## 2018-06-15 NOTE — Telephone Encounter (Signed)
I called pts wife about her husbands appt on 06/21/2018. I stated it will be change to video visit due to the [place].PTs wife stated she has a phone with a camera and a email. I gave the wife [email or web address] to download on her phone as a appt. I spent 20 minutes troubleshooting helping the wife. She will get her daughter to assist her in downloading the appt this evening. She will call back when completed. I receive consent for video visit and to file pts husband insurance from wife.Also I updated the med list, and verified PCP.

## 2018-06-16 NOTE — Telephone Encounter (Signed)
I called the wife back she has the webex on her cell phone.She also receive the email to her gmail account.

## 2018-06-16 NOTE — Telephone Encounter (Signed)
Pts wife called in and provided email address  Email- [email or web address]

## 2018-06-17 ENCOUNTER — Ambulatory Visit (INDEPENDENT_AMBULATORY_CARE_PROVIDER_SITE_OTHER): Payer: Medicare Other | Admitting: *Deleted

## 2018-06-17 ENCOUNTER — Other Ambulatory Visit: Payer: Self-pay

## 2018-06-17 DIAGNOSIS — Z8673 Personal history of transient ischemic attack (TIA), and cerebral infarction without residual deficits: Secondary | ICD-10-CM | POA: Diagnosis not present

## 2018-06-17 DIAGNOSIS — I63511 Cerebral infarction due to unspecified occlusion or stenosis of right middle cerebral artery: Secondary | ICD-10-CM

## 2018-06-18 LAB — CUP PACEART REMOTE DEVICE CHECK
Date Time Interrogation Session: 20200417220852
Implantable Pulse Generator Implant Date: 20191104

## 2018-06-21 ENCOUNTER — Ambulatory Visit (INDEPENDENT_AMBULATORY_CARE_PROVIDER_SITE_OTHER): Payer: Medicare Other | Admitting: Adult Health

## 2018-06-21 ENCOUNTER — Other Ambulatory Visit: Payer: Self-pay

## 2018-06-21 DIAGNOSIS — I63411 Cerebral infarction due to embolism of right middle cerebral artery: Secondary | ICD-10-CM

## 2018-06-21 NOTE — Telephone Encounter (Signed)
Shane Poisson, NP at 06/21/2018 2:45 PM   Status: Signed    Patient scheduled for telemedicine visit via WebEx initially this morning but as they missed this appointment, rescheduled at 2:45 PM the same day.  After 3 PM, meeting ended as they did not show to meeting.  Patient will be responsible for rescheduling at this time.

## 2018-06-21 NOTE — Progress Notes (Deleted)
[name] [address] [place]. [place] [address] [phone]       VIRTUAL VISIT FOLLOW-UP NOTE  Mr. [name] Date of Birth:  April 30, 1940 Medical Record Number:  [id number]   Referring MD: Rosalin Hawking  Reason for Referral: Stroke  Virtual Visit via Video Note  I connected with [name] on 06/21/18 at 10:45 AM EDT by a video enabled telemedicine application located remotely in my own home and verified that I am speaking with the correct person using two identifiers who was located at their own home.   I discussed the limitations of evaluation and management by telemedicine and the availability of in person appointments. The patient expressed understanding and agreed to proceed.   HPI:   [name] is a 78 y.o. male with underlying medical history of CAD s/p CABG x4, HTN, HLD, MI, obesity, prior strokes and history of blood clots with recent diagnosis of right MCA infarcts on 01/01/2018 with loop recorder inserted to assess for atrial fibrillation as potential cause of stroke.  Loop recorder showed atrial flutter on 03/10/2018 and initiated Eliquis.  Residual deficits of left hand weakness.  He was participating in outpatient therapy but currently on hold due to COVID-19 [place] depressions.  [facility] was [name] for potential OSA evaluation and was evaluated by Dr. Rexene [name] on 04/27/2018 and recommended undergoing sleep study with potential positive airway pressure titration.  Sleep study has not been completed at this time.  He continues on Eliquis but has experienced nosebleeds and referred to [facility].  Last reported nosebleed 05/19/2018.  He continues on rosuvastatin without side effects myalgias.  Blood pressure ***. No further concerns at this time.  Denies new or worsening stroke/TIA symptoms.     ROS:   14 system review of systems is positive for neck pain, shoulder pain, hand weakness and all other systems negative  PMH:  Past  Medical History:  Diagnosis Date  . CAD (coronary artery disease)    s/p cath in 2005 and CABG x4  . Cataract   . DJD (degenerative joint disease)   . DJD (degenerative joint disease)   . Fluid retention    mild  . H/O: gout   . History of blood clots   . HTN (hypertension)   . Hyperlipidemia   . Hyperlipidemia   . Myocardial infarction ([place]) 2009  . Obesity    with reduction  . OSA (obstructive sleep apnea)    AHI 98/hr  . Renal insufficiency   . Sleep apnea   . Stroke [facility]) 2000    Social History:  Social History   Socioeconomic History  . Marital status: Married    Spouse name: [name]  . Number of children: Not on file  . Years of education: Not on file  . Highest education level: Not on file  Occupational History  . Not on file  Social Needs  . Financial resource strain: Not on file  . Food insecurity:    Worry: Not on file    Inability: Not on file  . Transportation needs:    Medical: Not on file    Non-medical: Not on file  Tobacco Use  . Smoking status: Former Smoker    Types: Cigars  . Smokeless tobacco: Never Used  . Tobacco comment: used to smoke cigars, 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, none since  Substance and Sexual Activity  . Alcohol use: No  . Drug use: No  .  Sexual activity: Not on file  Lifestyle  . Physical activity:    Days per week: Not on file    Minutes per session: Not on file  . Stress: Not on file  Relationships  . Social connections:    Talks on phone: Not on file    Gets together: Not on file    Attends religious service: Not on file    Active member of club or organization: Not on file    Attends meetings of clubs or organizations: Not on file    Relationship status: Not on file  . Intimate partner violence:    Fear of current or ex partner: Not on file    Emotionally abused: Not on file    Physically abused: Not on file    Forced sexual activity: Not on file  Other Topics Concern  . Not on file  Social History  Narrative  . Not on file    Medications:   Current Outpatient Medications on File Prior to Visit  Medication Sig Dispense Refill  . acetaminophen (TYLENOL) 325 MG tablet Take 1-2 tablets (325-650 mg total) by mouth every 4 (four) hours as needed for mild pain.    [name] allopurinol (ZYLOPRIM) 100 MG tablet Take 200 mg by mouth daily.     [name] amLODipine (NORVASC) 5 MG tablet TAKE 1 TABLET BY MOUTH ONCE DAILY 90 tablet 0  . apixaban (ELIQUIS) 5 MG TABS tablet Take 1 tablet (5 mg total) by mouth 2 (two) times daily. 60 tablet 6  . furosemide (LASIX) 20 MG tablet Take 1/2 (one-half) tablet by mouth once daily 30 tablet 0  . Menthol-Methyl Salicylate (MUSCLE RUB) 10-15 % CREA Apply 1 application topically 4 (four) times daily -  with meals and at bedtime. To neck and shoulders  0  . metoprolol tartrate (LOPRESSOR) 25 MG tablet TAKE ONE TABLET BY MOUTH TWICE DAILY (Patient taking differently: Take 25 mg by mouth 2 (two) times daily. ) 30 tablet 0  . montelukast (SINGULAIR) 10 MG tablet Take 10 mg by mouth at bedtime.    . niacin (NIASPAN) 500 MG CR tablet Take 500 mg by mouth at bedtime.      . pantoprazole (PROTONIX) 40 MG tablet Take 1 tablet (40 mg total) by mouth daily. 30 tablet 0  . polyethylene glycol (MIRALAX / GLYCOLAX) packet Take 17 g by mouth daily. 60 each 0  . rosuvastatin (CRESTOR) 20 MG tablet Take 1 tablet (20 mg total) by mouth daily at 6 PM.    . tamsulosin (FLOMAX) 0.4 MG CAPS capsule Take 1 capsule (0.4 mg total) by mouth daily. 30 capsule    No current facility-administered medications on file prior to visit.     Allergies:   Allergies  Allergen Reactions  . Ramipril Cough    Physical Exam General: Obese elderly African-American male, seated, in no evident distress Head: head normocephalic and atraumatic.     Neurologic Exam Mental Status: Awake and fully alert. Oriented to place and time. Recent and remote memory intact. Attention span, concentration and fund of  knowledge appropriate. Mood and affect appropriate.  Cranial Nerves: Extraocular movements full without nystagmus. Visual fields full to confrontation. Hearing intact. Facial sensation intact. Face, tongue, palate moves normally and symmetrically.  Motor: [name]  Mild left upper extremity weakness 4/5 strength proximally and 3/5 strength in the left grip and intrinsic hand muscles.  Left shoulder movements limited due to neck pain. Sensory.: intact to touch , pinprick , position and vibratory  sensation.  Coordination: Rapid alternating movements normal in all extremities. Finger-to-nose and heel-to-shin performed accurately bilaterally. Gait and Station: Arises from chair without difficulty. Stance is normal. Gait demonstrates normal stride length and balance . Not able to heel, toe and tandem walk .  Reflexes: 1+ and symmetric. Toes downgoing.      ASSESSMENT: [name] is a  54 year African-American male with embolic right MCA infarct in November 2019 with subsequent diagnosis of atrial flutter on loop recorder monitoring.  Vascular risk factors of obesity, hypertension, hyperlipidemia,atrial flutter, and suspected sleep apnea     PLAN: 1. Right MCA infarct : Continue Eliquis (apixaban) daily  and rosuvastatin for secondary stroke prevention. Maintain strict control of hypertension with blood pressure goal below 130/90, diabetes with hemoglobin A1c goal below 6.5% and cholesterol with LDL cholesterol (bad cholesterol) goal below 70 mg/dL.  I also advised the patient to eat a healthy diet with plenty of whole grains, cereals, fruits and vegetables, exercise regularly with at least 30 minutes of continuous activity daily and maintain ideal body weight. 2. Atrial flutter: Please follow-up with cardiology.  Continue Eliquis 3. HTN: Advised to continue current treatment regimen.  Today's BP ***.  Advised to continue to monitor at home along with continued follow-up with PCP for management 4. HLD:  Advised to continue current treatment regimen along with continued follow-up with PCP for future prescribing and monitoring of lipid panel 5. DMII: Advised to continue to monitor glucose levels at home along with continued follow-up with PCP for management and monitoring 6. ?OSA:      Venancio Poisson, AGNP-BC  [facility] [address] [place], [place] [address]  Phone [phone] Fax [phone] Note: This document was prepared with digital dictation and possible smart phrase technology. Any transcriptional errors that result from this process are unintentional.

## 2018-06-21 NOTE — Progress Notes (Signed)
Patient scheduled for telemedicine visit via WebEx initially this morning but as they missed this appointment, rescheduled at 2:45 PM the same day.  After 3 PM, meeting ended as they did not show to meeting.  Patient will be responsible for rescheduling at this time.

## 2018-06-22 NOTE — Progress Notes (Signed)
Carelink Summary Report / Loop Recorder 

## 2018-06-23 ENCOUNTER — Encounter: Payer: Self-pay | Admitting: Adult Health

## 2018-06-23 ENCOUNTER — Other Ambulatory Visit: Payer: Self-pay

## 2018-06-23 ENCOUNTER — Ambulatory Visit: Payer: Medicare Other | Admitting: Adult Health

## 2018-06-23 ENCOUNTER — Ambulatory Visit (INDEPENDENT_AMBULATORY_CARE_PROVIDER_SITE_OTHER): Payer: Medicare Other | Admitting: Adult Health

## 2018-06-23 DIAGNOSIS — I10 Essential (primary) hypertension: Secondary | ICD-10-CM

## 2018-06-23 DIAGNOSIS — G8194 Hemiplegia, unspecified affecting left nondominant side: Secondary | ICD-10-CM

## 2018-06-23 DIAGNOSIS — E785 Hyperlipidemia, unspecified: Secondary | ICD-10-CM | POA: Diagnosis not present

## 2018-06-23 DIAGNOSIS — I63411 Cerebral infarction due to embolism of right middle cerebral artery: Secondary | ICD-10-CM

## 2018-06-23 DIAGNOSIS — I4892 Unspecified atrial flutter: Secondary | ICD-10-CM

## 2018-06-23 NOTE — Progress Notes (Signed)
I agree with the above plan 

## 2018-06-23 NOTE — Progress Notes (Signed)
[name] [address] [place] [address] [phone]       FOLLOW UP NOTE  Mr. [name] Date of Birth:  09/15/1940 Medical Record Number:  [id number]   Referring MD: [name]  Reason for Referral: Stroke  Virtual Visit via Video Note  I connected with [name] on 06/23/18 at  3:45 PM EDT by a video enabled telemedicine application located remotely in my own home and verified that I am speaking with the correct person using two identifiers who was located at their own home.   I discussed the limitations of evaluation and management by telemedicine and the availability of in person appointments. The patient expressed understanding and agreed to proceed.    HPI:   [name] is a 78 y.o. male with underlying history of CAD s/p CABG x4, HTN, HLD, MI, obesity, prior strokes and history of blood clots with recent diagnosis of right MCA infarct in 12/2017 with loop recorder inserted to assess for atrial fibrillation as potential cause of stroke.  He was initially scheduled today for office follow-up regarding stroke but due to COVID-19 safety precautions, visit transitioned to telemedicine via WebEx with patient's consent. He has been stable from a stroke standpoint with residual deficits of mild left hand weakness and per family, mild left leg weakness.  He occasionally will ambulate with a cane due to occasional walking difficulties but denies any recent falls.  He was participating in outpatient therapies but currently on hold due to COVID-19 safety precautions.  He does endorse continuation of home exercises.  Loop recorder showed atrial flutter on 03/10/2018 therefore Eliquis initiated.  Per review of notes, he was experiencing nosebleeds and referred to [facility] but due to COVID-19, initial evaluation canceled.  Last reported nosebleed in 05/2018.  Denies any recent nosebleeds or any other bleeding or bruising.  Continues on rosuvastatin  without side effects myalgias.  Blood pressure stable.  At prior visit, he was referred to [place] sleep clinic for OSA evaluation and evaluated by Dr.  Rexene Alberts on 04/27/2018.  Recommended to undergo sleep study with potential positive airway pressure titration but study has been postponed due to COVID-19 safety precautions.  No further concerns at this time.  Denies new or worsening stroke/TIA symptoms.    ROS:   14 system review of systems is positive for weakness and all other systems negative  PMH:  Past Medical History:  Diagnosis Date   CAD (coronary artery disease)    s/p cath in 2005 and CABG x4   Cataract    DJD (degenerative joint disease)    DJD (degenerative joint disease)    Fluid retention    mild   H/O: gout    History of blood clots    HTN (hypertension)    Hyperlipidemia    Hyperlipidemia    Myocardial infarction ([place]) 2009   Obesity    with reduction   OSA (obstructive sleep apnea)    AHI 98/hr   Renal insufficiency    Sleep apnea    Stroke [facility]) 2000    Social History:  Social History   Socioeconomic History   Marital status: Married    Spouse name: [name]   Number of children: Not on file   Years of education: Not on file   Highest education level: Not on file  Occupational History   Not on file  Social Needs   Financial resource strain: Not on file   Food insecurity:  Worry: Not on file    Inability: Not on file   Transportation needs:    Medical: Not on file    Non-medical: Not on file  Tobacco Use   Smoking status: Former Smoker    Types: Cigars   Smokeless tobacco: Never Used   Tobacco comment: used to smoke cigars, 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, none since  Substance and Sexual Activity   Alcohol use: No   Drug use: No   Sexual activity: Not on file  Lifestyle   Physical activity:    Days per week: Not on file    Minutes per session: Not on file   Stress: Not on file  Relationships   Social  connections:    Talks on phone: Not on file    Gets together: Not on file    Attends religious service: Not on file    Active member of club or organization: Not on file    Attends meetings of clubs or organizations: Not on file    Relationship status: Not on file   Intimate partner violence:    Fear of current or ex partner: Not on file    Emotionally abused: Not on file    Physically abused: Not on file    Forced sexual activity: Not on file  Other Topics Concern   Not on file  Social History Narrative   Not on file    Medications:   Current Outpatient Medications on File Prior to Visit  Medication Sig Dispense Refill   acetaminophen (TYLENOL) 325 MG tablet Take 1-2 tablets (325-650 mg total) by mouth every 4 (four) hours as needed for mild pain.     allopurinol (ZYLOPRIM) 100 MG tablet Take 200 mg by mouth daily.      amLODipine (NORVASC) 5 MG tablet TAKE 1 TABLET BY MOUTH ONCE DAILY 90 tablet 0   apixaban (ELIQUIS) 5 MG TABS tablet Take 1 tablet (5 mg total) by mouth 2 (two) times daily. 60 tablet 6   furosemide (LASIX) 20 MG tablet Take 1/2 (one-half) tablet by mouth once daily 30 tablet 0   Menthol-Methyl Salicylate (MUSCLE RUB) 10-15 % CREA Apply 1 application topically 4 (four) times daily -  with meals and at bedtime. To neck and shoulders  0   metoprolol tartrate (LOPRESSOR) 25 MG tablet TAKE ONE TABLET BY MOUTH TWICE DAILY (Patient taking differently: Take 25 mg by mouth 2 (two) times daily. ) 30 tablet 0   montelukast (SINGULAIR) 10 MG tablet Take 10 mg by mouth at bedtime.     niacin (NIASPAN) 500 MG CR tablet Take 500 mg by mouth at bedtime.       pantoprazole (PROTONIX) 40 MG tablet Take 1 tablet (40 mg total) by mouth daily. 30 tablet 0   polyethylene glycol (MIRALAX / GLYCOLAX) packet Take 17 g by mouth daily. 60 each 0   rosuvastatin (CRESTOR) 20 MG tablet Take 1 tablet (20 mg total) by mouth daily at 6 PM.     tamsulosin (FLOMAX) 0.4 MG CAPS capsule  Take 1 capsule (0.4 mg total) by mouth daily. 30 capsule    No current facility-administered medications on file prior to visit.     Allergies:   Allergies  Allergen Reactions   Ramipril Cough    Physical Exam General: Obese pleasant elderly African-American male, seated, in no evident distress Head: head normocephalic and atraumatic.    Neurologic Exam Mental Status: Awake and fully alert. Oriented to place and time. Recent  and remote memory intact. Attention span, concentration and fund of knowledge appropriate. Mood and affect appropriate.  Cranial Nerves: Extraocular movements full without nystagmus. Hearing intact to voice. Face, tongue, palate moves normally and symmetrically.  Shoulder shrug symmetric. Motor: No evidence of large muscle weakness project assessment.  Decreased left hand finger dexterity.  Unable to stand from seated position with arms crossed. Sensory.: intact to touch , pinprick , position and vibratory sensation.  Coordination: Rapid alternating movements normal in all extremities except decreased left hand finger dexterity. Finger-to-nose and heel-to-shin performed accurately bilaterally.  Orbits right arm over left arm. Gait and Station: Arises from chair with mild difficulty. Stance is normal. Gait demonstrates normal stride length and balance with slight dragging of left foot.   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  Vascular risk factors of obesity, hypertension, hyperlipidemia,atrial flutter, and suspected sleep apnea.  He has been stable from a stroke standpoint with residual mild left hemiparesis.    PLAN: 1. Right MCA infarct: Continue Eliquis (apixaban) daily  and Crestor 20 mg for secondary stroke prevention. Maintain strict control of hypertension with blood pressure goal below 130/90, diabetes with  hemoglobin A1c goal below 6.5% and cholesterol with LDL cholesterol (bad cholesterol) goal below 70 mg/dL.  I also advised the patient to eat a healthy diet with plenty of whole grains, cereals, fruits and vegetables, exercise regularly with at least 30 minutes of continuous activity daily and maintain ideal body weight.  Restart therapies once COVID-19 safety precautions lifted.  Advised to continue to perform home exercises 2. HTN: Advised to continue current treatment regimen. Advised to continue to monitor at home along with continued follow-up with PCP for management 3. HLD: Advised to continue current treatment regimen along with continued follow-up with PCP for future prescribing and monitoring of lipid panel 4. ?  OSA: Initial evaluation completed but will need to undergo sleep study as recommended once COVID-19 safety precautions are lifted 5. Atrial flutter: Continuation of Eliquis and ongoing follow-up with cardiologist Dr. Lovena Le.  Advised him to notify cardiology if he experiences recurrent nosebleeds for possible need of evaluation by ENT.  Follow-up in 6 months or call earlier if needed  Greater than 50% of time during this 25 minute visit was spent on counseling,explanation of diagnosis of right MCA infarct, reviewing risk factor management of HTN, HLD, atrial flutter and possible OSA, planning of further management, discussion with patient and family and coordination of care    Venancio Poisson, AGNP-BC  [facility] [address] [place],  [address]  Phone [phone] Fax [phone] Note: This document was prepared with digital dictation and possible smart phrase technology. Any transcriptional errors that result from this process are unintentional.

## 2018-07-05 ENCOUNTER — Other Ambulatory Visit: Payer: Self-pay | Admitting: Adult Health

## 2018-07-05 ENCOUNTER — Other Ambulatory Visit: Payer: Self-pay | Admitting: Internal Medicine

## 2018-07-06 NOTE — Telephone Encounter (Signed)
Amlodipine 5 mg refilled.

## 2018-07-06 NOTE — Telephone Encounter (Signed)
Furosemide 20 mg refilled.

## 2018-07-20 ENCOUNTER — Ambulatory Visit (INDEPENDENT_AMBULATORY_CARE_PROVIDER_SITE_OTHER): Payer: Medicare Other | Admitting: *Deleted

## 2018-07-20 DIAGNOSIS — I63511 Cerebral infarction due to unspecified occlusion or stenosis of right middle cerebral artery: Secondary | ICD-10-CM | POA: Diagnosis not present

## 2018-07-21 ENCOUNTER — Other Ambulatory Visit: Payer: Self-pay

## 2018-07-21 ENCOUNTER — Encounter: Payer: Medicare Other | Admitting: Physical Medicine & Rehabilitation

## 2018-07-21 LAB — CUP PACEART REMOTE DEVICE CHECK
Date Time Interrogation Session: 20200520220750
Implantable Pulse Generator Implant Date: 20191104

## 2018-07-29 NOTE — Progress Notes (Signed)
Carelink Summary Report / Loop Recorder 

## 2018-08-03 ENCOUNTER — Telehealth: Payer: Self-pay | Admitting: Cardiology

## 2018-08-03 NOTE — Telephone Encounter (Signed)
LMOVM requesting that pt send a manual transmission w/ his home monitor.  

## 2018-08-05 NOTE — Telephone Encounter (Signed)
Pt wife called to confirm if remote transmission was received. Transmission received.

## 2018-08-22 ENCOUNTER — Ambulatory Visit (INDEPENDENT_AMBULATORY_CARE_PROVIDER_SITE_OTHER): Payer: Medicare Other | Admitting: *Deleted

## 2018-08-22 DIAGNOSIS — I63511 Cerebral infarction due to unspecified occlusion or stenosis of right middle cerebral artery: Secondary | ICD-10-CM | POA: Diagnosis not present

## 2018-08-23 LAB — CUP PACEART REMOTE DEVICE CHECK
Date Time Interrogation Session: 20200622224133
Implantable Pulse Generator Implant Date: 20191104

## 2018-08-30 NOTE — Progress Notes (Signed)
Carelink Summary Report / Loop Recorder 

## 2018-09-01 ENCOUNTER — Telehealth: Payer: Self-pay

## 2018-09-01 NOTE — Telephone Encounter (Signed)
Unable to speak with patient to regarding disconnected monitor

## 2018-09-05 ENCOUNTER — Telehealth: Payer: Self-pay

## 2018-09-05 NOTE — Telephone Encounter (Signed)
LMOVM for pt to send a manual transmission with his home monitor. I left my direct office number to call if he has any questions.

## 2018-09-06 NOTE — Telephone Encounter (Signed)
Manual transmission reviewed. New "AF" episodes are false-- SR w/ectopy and rare 2:1 HB. Tachy episodes appear SVT, episode #76 appears similar to previous SVT episodes, gradual onset, V rate 150s. Episode #74 suggests SVT, sudden onset, 15sec duration, max V rate 188bpm.  Attempted to reach pt to ask if symptomatic with intermittent HB or with tachy episode from 08/14/18 at 07:03. LMOM requesting call back to DC. If asymptomatic, will adjust tachy alerts in Carelink.

## 2018-09-06 NOTE — Telephone Encounter (Signed)
Transmission received 09-05-2018 

## 2018-09-08 NOTE — Telephone Encounter (Signed)
Pt denies any symptoms of dizziness, lightheadedness, syncope, near syncope, palpitations or worsening SOB. He has no complaints.  Questions answered.  Will adjust care link alerts.      Legrand Como [address] [place], PA-C 09/08/2018 11:44 AM

## 2018-09-25 LAB — CUP PACEART REMOTE DEVICE CHECK
Date Time Interrogation Session: 20200725233733
Implantable Pulse Generator Implant Date: 20191104

## 2018-09-26 ENCOUNTER — Ambulatory Visit (INDEPENDENT_AMBULATORY_CARE_PROVIDER_SITE_OTHER): Payer: Medicare Other | Admitting: *Deleted

## 2018-09-26 DIAGNOSIS — I639 Cerebral infarction, unspecified: Secondary | ICD-10-CM | POA: Diagnosis not present

## 2018-09-27 ENCOUNTER — Ambulatory Visit (INDEPENDENT_AMBULATORY_CARE_PROVIDER_SITE_OTHER): Payer: Medicare Other | Admitting: Neurology

## 2018-09-27 DIAGNOSIS — E669 Obesity, unspecified: Secondary | ICD-10-CM

## 2018-09-27 DIAGNOSIS — G4733 Obstructive sleep apnea (adult) (pediatric): Secondary | ICD-10-CM

## 2018-09-27 DIAGNOSIS — R351 Nocturia: Secondary | ICD-10-CM

## 2018-09-27 DIAGNOSIS — Z951 Presence of aortocoronary bypass graft: Secondary | ICD-10-CM

## 2018-09-27 DIAGNOSIS — G4720 Circadian rhythm sleep disorder, unspecified type: Secondary | ICD-10-CM

## 2018-09-27 DIAGNOSIS — Z8673 Personal history of transient ischemic attack (TIA), and cerebral infarction without residual deficits: Secondary | ICD-10-CM

## 2018-09-27 DIAGNOSIS — G4719 Other hypersomnia: Secondary | ICD-10-CM

## 2018-10-03 ENCOUNTER — Telehealth: Payer: Self-pay

## 2018-10-03 NOTE — Telephone Encounter (Signed)
I called [name]. I advised [name] that Dr. 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 Dr. Rexene Alberts recommends that [name] return for a repeat sleep study in order to properly titrate the cpap and ensure a good mask fit. [name] is agreeable to returning for a titration study. I advised [name] that our sleep lab will file with [name]'s insurance and call [name] to schedule the sleep study when we hear back from the [name]'s insurance regarding coverage of this sleep study. [name] verbalized understanding of results. [name] had no questions at this time but was encouraged to call back if questions arise.

## 2018-10-03 NOTE — Telephone Encounter (Signed)
-----   Message from Star Age, MD sent at 10/03/2018  8:08 AM EDT ----- Patient referred by Dr. Leonie Man, seen by me on 04/27/18, diagnostic PSG on 09/27/18 for re-eval of his prior Dx of OSA.   Please call and notify the patient that the recent sleep study showed mild OSA, severe during dream sleep, but he had a very difficult time sleeping, slept about 18% of the time tested. I recommend treatment for his OSA in the form of CPAP. This will require a repeat sleep study for proper titration and mask fitting and correct monitoring of the oxygen saturations. Please explain to patient. I have placed an order in the chart. Thanks.  Star Age, MD, PhD [name])

## 2018-10-03 NOTE — Procedures (Signed)
PATIENT'S NAME:  [name], Debold DOB:      19-Jan-1941      MR#:    [id number]     DATE OF RECORDING: 09/27/2018 REFERRING M.D.:  Antony Contras, MD Study Performed:   Baseline Polysomnogram HISTORY: 78 year old man with a complex medical history of hypertension, hyperlipidemia, coronary artery disease with status post MI and 4 vessel CABG, degenerative joint disease, gout, history of blood clot, kidney impairment, right MCA stroke in November 2019, prior lacunar infarcts, and obesity, who was previously diagnosed with obstructive sleep apnea. The patient endorsed the Epworth Sleepiness Scale at 13 points. The patient's weight 269 pounds with a height of 73 (inches), resulting in a BMI of 35.6 kg/m2. The patient's neck circumference measured 16.8 inches.  CURRENT MEDICATIONS: Tylenol, Zyloprim, Norvasc, Eliquis, Lasix, Lopressor, Singulair, Protonix, Crestor, Flomax.   PROCEDURE:  This is a multichannel digital polysomnogram utilizing the Somnostar 11.2 system.  Electrodes and sensors were applied and monitored per AASM Specifications.   EEG, EOG, Chin and Limb EMG, were sampled at 200 Hz.  ECG, Snore and Nasal Pressure, Thermal Airflow, Respiratory Effort, CPAP Flow and Pressure, Oximetry was sampled at 50 Hz. Digital video and audio were recorded.      BASELINE STUDY  Lights Out was at 21:31 and Lights On at 04:57.  Total recording time (TRT) was 447 minutes, with a total sleep time (TST) of 81 minutes.   The patient's sleep latency to persistent sleep was 335.5 minutes, which is markedly delayed. REM latency was 334.5 minutes, which is markedly delayed. The sleep efficiency was 18.1 %, which is severely reduced.     SLEEP ARCHITECTURE: WASO (Wake after sleep onset) was 340.5 minutes with minimal sleep noted.  There were 32 minutes in Stage N1, 35 minutes Stage N2, 5.5 minutes Stage N3 and 8.5 minutes in Stage REM.  The percentage of Stage N1 was 39.5%, which is increased, Stage N2 was 43.2%, Stage N3 was  6.8% and Stage R (REM sleep) was 10.5%, which is reduced. The arousals were noted as: 28 were spontaneous, 0 were associated with PLMs, 3 were associated with respiratory events.  RESPIRATORY ANALYSIS:  There were a total of 14 respiratory events:  1 obstructive apneas, 0 central apneas and 5 mixed apneas with a total of 6 apneas and an apnea index (AI) of 4.4 /hour. There were 8 hypopneas with a hypopnea index of 5.9 /hour. The patient also had 0 respiratory event related arousals (RERAs).      The total APNEA/HYPOPNEA INDEX (AHI) was 10.4 /hour and the total RESPIRATORY DISTURBANCE INDEX was 10.4 /hour.  8 events occurred in REM sleep and 10 events in NREM. The REM AHI was 56.5 /hour, versus a non-REM AHI of 5.. The patient spent 11.5 minutes of total sleep time in the supine position and 70 minutes in non-supine.. The supine AHI was 10.4 versus a non-supine AHI of 10.4.  OXYGEN SATURATION & C02:  The Wake baseline 02 saturation was 96%, with the lowest being 82% (69% on technical report was due to error). There were numerous instances of O2 sensor loss. Time spent below 89% saturation equaled 6 minutes.  PERIODIC LIMB MOVEMENTS: The patient had a total of 0 Periodic Limb Movements.  The Periodic Limb Movement (PLM) index was 0 and the PLM Arousal index was 0/hour.  Audio and video analysis did not show any abnormal or unusual movements, behaviors, phonations or vocalizations. The patient took 6 bathroom breaks. Mild snoring was noted. The EKG was  in keeping with normal sinus rhythm (NSR).  IMPRESSION:  1. Obstructive Sleep Apnea (OSA) 2. Dysfunctions associated with sleep stages or arousal from sleep  RECOMMENDATIONS:  1. 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 Given the patient's medical history and sleep related complaints, treatment with CPAP is advised; a full-night CPAP  titration study is recommended to optimize therapy. Other treatment options may include avoidance of supine sleep position along with weight loss, upper airway or jaw surgery in selected patients or the use of an oral appliance in certain patients. ENT evaluation and/or consultation with a maxillofacial surgeon or dentist may be feasible in some instances.   2. This study shows significant sleep fragmentation, poor sleep consolidation and abnormal sleep stage percentages; these are nonspecific findings and per se do not signify an intrinsic sleep disorder or a cause for the patient's sleep-related symptoms. Causes include (but are not limited to) the first night effect of the sleep study, circadian rhythm disturbances, medication effect or an underlying mood disorder or medical problem. The reduced sleep efficiency and reduced amount of REM sleep and near-absence of supine sleep likely underestimates his sleep disordered breathing.  3. The patient should be cautioned not to drive, work at heights, or operate dangerous or heavy equipment when tired or sleepy. Review and reiteration of good sleep hygiene measures should be pursued with any patient. 4. The patient will be seen in follow-up in the sleep clinic at [facility] for discussion of the test results, symptom and treatment compliance review, further management strategies, etc. The referring provider will be notified of the test results.  I certify that I have reviewed the entire raw data recording prior to the issuance of this report in accordance with the Standards of Accreditation of the American Academy of Sleep Medicine (AASM)   Star Age, MD, PhD Diplomat, American Board of Neurology and Sleep Medicine (Neurology and Sleep Medicine)

## 2018-10-03 NOTE — Progress Notes (Signed)
ca

## 2018-10-03 NOTE — Addendum Note (Signed)
Addended by: Star Age on: 10/03/2018 08:08 AM   Modules accepted: Orders

## 2018-10-03 NOTE — Progress Notes (Signed)
Patient referred by Dr. Leonie Man, seen by me on 04/27/18, diagnostic PSG on 09/27/18 for re-eval of his prior Dx of OSA.   Please call and notify the patient that the recent sleep study showed mild OSA, severe during dream sleep, but he had a very difficult time sleeping, slept about 18% of the time tested. I recommend treatment for his OSA in the form of CPAP. This will require a repeat sleep study for proper titration and mask fitting and correct monitoring of the oxygen saturations. Please explain to patient. I have placed an order in the chart. Thanks.  Star Age, MD, PhD [name])

## 2018-10-10 DIAGNOSIS — M1991 Primary osteoarthritis, unspecified site: Secondary | ICD-10-CM

## 2018-10-10 DIAGNOSIS — I252 Old myocardial infarction: Secondary | ICD-10-CM

## 2018-10-10 DIAGNOSIS — Z6841 Body Mass Index (BMI) 40.0 and over, adult: Secondary | ICD-10-CM

## 2018-10-10 DIAGNOSIS — I69391 Dysphagia following cerebral infarction: Secondary | ICD-10-CM | POA: Diagnosis not present

## 2018-10-10 DIAGNOSIS — J439 Emphysema, unspecified: Secondary | ICD-10-CM

## 2018-10-10 DIAGNOSIS — Z951 Presence of aortocoronary bypass graft: Secondary | ICD-10-CM

## 2018-10-10 DIAGNOSIS — I6522 Occlusion and stenosis of left carotid artery: Secondary | ICD-10-CM

## 2018-10-10 DIAGNOSIS — I69322 Dysarthria following cerebral infarction: Secondary | ICD-10-CM

## 2018-10-10 DIAGNOSIS — G4733 Obstructive sleep apnea (adult) (pediatric): Secondary | ICD-10-CM

## 2018-10-10 DIAGNOSIS — Z87891 Personal history of nicotine dependence: Secondary | ICD-10-CM

## 2018-10-10 DIAGNOSIS — R1310 Dysphagia, unspecified: Secondary | ICD-10-CM

## 2018-10-10 DIAGNOSIS — Z86718 Personal history of other venous thrombosis and embolism: Secondary | ICD-10-CM

## 2018-10-10 DIAGNOSIS — I120 Hypertensive chronic kidney disease with stage 5 chronic kidney disease or end stage renal disease: Secondary | ICD-10-CM

## 2018-10-10 DIAGNOSIS — D631 Anemia in chronic kidney disease: Secondary | ICD-10-CM

## 2018-10-10 DIAGNOSIS — E785 Hyperlipidemia, unspecified: Secondary | ICD-10-CM

## 2018-10-10 DIAGNOSIS — M109 Gout, unspecified: Secondary | ICD-10-CM

## 2018-10-10 DIAGNOSIS — I69354 Hemiplegia and hemiparesis following cerebral infarction affecting left non-dominant side: Secondary | ICD-10-CM

## 2018-10-10 DIAGNOSIS — Z9181 History of falling: Secondary | ICD-10-CM

## 2018-10-10 DIAGNOSIS — I700 Atherosclerosis of aorta: Secondary | ICD-10-CM

## 2018-10-10 DIAGNOSIS — N184 Chronic kidney disease, stage 4 (severe): Secondary | ICD-10-CM

## 2018-10-10 DIAGNOSIS — E669 Obesity, unspecified: Secondary | ICD-10-CM

## 2018-10-10 DIAGNOSIS — I2510 Atherosclerotic heart disease of native coronary artery without angina pectoris: Secondary | ICD-10-CM

## 2018-10-11 NOTE — Progress Notes (Signed)
Carelink Summary Report / Loop Recorder 

## 2018-10-27 ENCOUNTER — Ambulatory Visit (INDEPENDENT_AMBULATORY_CARE_PROVIDER_SITE_OTHER): Payer: Medicare Other | Admitting: *Deleted

## 2018-10-27 DIAGNOSIS — I6389 Other cerebral infarction: Secondary | ICD-10-CM | POA: Diagnosis not present

## 2018-10-27 DIAGNOSIS — I639 Cerebral infarction, unspecified: Secondary | ICD-10-CM

## 2018-10-28 LAB — CUP PACEART REMOTE DEVICE CHECK
Date Time Interrogation Session: 20200827233832
Implantable Pulse Generator Implant Date: 20191104

## 2018-11-01 ENCOUNTER — Other Ambulatory Visit (HOSPITAL_COMMUNITY)
Admission: RE | Admit: 2018-11-01 | Discharge: 2018-11-01 | Disposition: A | Discharge status: Home / Self Care | Payer: Medicare Other | Source: Ambulatory Visit | Attending: Neurology | Admitting: Neurology

## 2018-11-01 ENCOUNTER — Ambulatory Visit (INDEPENDENT_AMBULATORY_CARE_PROVIDER_SITE_OTHER): Payer: Medicare Other | Admitting: Internal Medicine

## 2018-11-01 ENCOUNTER — Other Ambulatory Visit: Payer: Self-pay

## 2018-11-01 ENCOUNTER — Encounter: Payer: Self-pay | Admitting: Internal Medicine

## 2018-11-01 VITALS — BP 120/74 | HR 58 | Ht 73.0 in | Wt 270.2 lb

## 2018-11-01 DIAGNOSIS — I2510 Atherosclerotic heart disease of native coronary artery without angina pectoris: Secondary | ICD-10-CM

## 2018-11-01 DIAGNOSIS — Z20828 Contact with and (suspected) exposure to other viral communicable diseases: Secondary | ICD-10-CM | POA: Insufficient documentation

## 2018-11-01 DIAGNOSIS — E7800 Pure hypercholesterolemia, unspecified: Secondary | ICD-10-CM

## 2018-11-01 DIAGNOSIS — Z8673 Personal history of transient ischemic attack (TIA), and cerebral infarction without residual deficits: Secondary | ICD-10-CM

## 2018-11-01 DIAGNOSIS — Z01812 Encounter for preprocedural laboratory examination: Secondary | ICD-10-CM | POA: Diagnosis not present

## 2018-11-01 DIAGNOSIS — I4892 Unspecified atrial flutter: Secondary | ICD-10-CM

## 2018-11-01 DIAGNOSIS — I10 Essential (primary) hypertension: Secondary | ICD-10-CM | POA: Diagnosis not present

## 2018-11-01 LAB — SARS CORONAVIRUS 2 (TAT 6-24 HRS): SARS Coronavirus 2: NEGATIVE

## 2018-11-01 NOTE — Patient Instructions (Signed)
Medication Instructions:  Your physician recommends that you continue on your current medications as directed. Please refer to the Current Medication list given to you today.  If you need a refill on your cardiac medications before your next appointment, please call your pharmacy.    Follow-Up: At [facility], you and your health needs are our priority.  As part of our continuing mission to provide you with exceptional heart care, we have created designated Provider Care Teams.  These Care Teams include your primary Cardiologist (physician) and Advanced Practice Providers (APPs -  Physician Assistants and Nurse Practitioners) who all work together to provide you with the care you need, when you need it. You will need a follow up appointment in 12 months.  Please call our office 2 months in advance to schedule this appointment.  You may see Dr. Hilty or one of the following Advanced Practice Providers on your designated Care Team: Hao Meng, PA-C . Angela Duke, PA-C    

## 2018-11-01 NOTE — Progress Notes (Signed)
OFFICE NOTE  Chief Complaint:  Follow-up stroke  Primary Care Physician: Rogers Blocker, MD  HPI:  [name] is a 78 y.o. male with a past medial history significant for coronary artery disease status post CABG in 2005 (LIMA to LAD, SVG to first OM and SVG to second OM and SVG to RCA (, hypertension, dyslipidemia and gout.  He was last seen in July by Leonia Reader, DNP, which time he had a stress test which was negative for ischemia.  I had met him in October 2018 at the time when he was hospitalized for bacteremia and had a TEE then which was negative for vegetation.  Recently he unfortunately had a stroke in November and had a repeat TEE which was unremarkable.  Subsequently he had an implanted loop recorder placed by Dr. Lovena Le.  This did demonstrate 2-1 atrial flutter with 10 episodes in January and he ultimately has been placed on Eliquis.  He seems to be tolerating this.  He was unaware of any A. fib.  He continues in neuro rehab.  11/01/2018  [name] is seen today in follow-up.  Overall he continues to do well.  He denies any chest pain or worsening shortness of breath.  His blood pressure is well controlled today.  Heart rate is low normal.  His only complaint really is some itchiness of his sternal scars which is likely from keloids.  EKG showed sinus bradycardia with some PVCs.  He is asymptomatic denying any palpitations or awareness of his heart rate.  He denies any recurrent atrial fibrillation.  He remains on Eliquis for anticoagulation.  No bleeding issues.  Cholesterol is followed by his primary care provider Dr. Marlou Sa.  PMHx:  Past Medical History:  Diagnosis Date  . CAD (coronary artery disease)    s/p cath in 2005 and CABG x4  . Cataract   . DJD (degenerative joint disease)   . DJD (degenerative joint disease)   . Fluid retention    mild  . H/O: gout   . History of blood clots   . HTN (hypertension)   . Hyperlipidemia   . Hyperlipidemia   . Myocardial  infarction ([place]) 2009  . Obesity    with reduction  . OSA (obstructive sleep apnea)    AHI 98/hr  . Renal insufficiency   . Sleep apnea   . Stroke [facility]) 2000    Past Surgical History:  Procedure Laterality Date  . CORONARY ARTERY BYPASS GRAFT  2005   LIMA to LAD, SVG to OM1 and OM 2, RCA.   [name] left inguinal hernia repair    . LOOP RECORDER INSERTION N/A 01/03/2018   Procedure: LOOP RECORDER INSERTION;  Surgeon: Evans Lance, MD;  Location: [facility];  Service: Cardiovascular;  Laterality: N/A;  . TEE WITHOUT CARDIOVERSION N/A 12/11/2016   Procedure: TRANSESOPHAGEAL ECHOCARDIOGRAM (TEE);  Surgeon: [name], MD;  Location: [facility] ENDOSCOPY;  Service: Cardiovascular;  Laterality: N/A;  . TEE WITHOUT CARDIOVERSION N/A 01/03/2018   Procedure: TRANSESOPHAGEAL ECHOCARDIOGRAM (TEE);  Surgeon: Dorothy Spark, MD;  Location: [facility] ENDOSCOPY;  Service: Cardiovascular;  Laterality: N/A;    FAMHx:  Family History  Problem Relation Age of Onset  . Gout Mother   . Stroke Mother   . Coronary artery disease Mother   . Hypertension Mother   . Arthritis Mother   . Coronary artery disease Father   . Arthritis Father   . Gout Father   . Stroke Father   .  Hypertension Father   . Coronary artery disease Brother   . Arthritis Brother   . Gout Brother   . Stroke Brother   . Hypertension Brother   . Coronary artery disease Brother   . Arthritis Brother   . Gout Brother   . Stroke Brother   . Hypertension Brother   . Coronary artery disease Other        significant in multiple family members    SOCHx:   reports that he has quit smoking. His smoking use included cigars. He has never used smokeless tobacco. He reports that he does not drink alcohol or use drugs.  ALLERGIES:  Allergies  Allergen Reactions  . Ramipril Cough    ROS: Pertinent items noted in HPI and remainder of comprehensive ROS otherwise negative.  HOME MEDS: Current Outpatient Medications on File Prior  to Visit  Medication Sig Dispense Refill  . acetaminophen (TYLENOL) 325 MG tablet Take 1-2 tablets (325-650 mg total) by mouth every 4 (four) hours as needed for mild pain.    [name] allopurinol (ZYLOPRIM) 100 MG tablet Take 200 mg by mouth daily.     [name] amLODipine (NORVASC) 5 MG tablet Take 1 tablet by mouth once daily 90 tablet 2  . apixaban (ELIQUIS) 5 MG TABS tablet Take 1 tablet (5 mg total) by mouth 2 (two) times daily. 60 tablet 6  . furosemide (LASIX) 20 MG tablet Take 1/2 (one-half) tablet by mouth once daily 30 tablet 1  . Menthol-Methyl Salicylate (MUSCLE RUB) 10-15 % CREA Apply 1 application topically 4 (four) times daily -  with meals and at bedtime. To neck and shoulders  0  . metoprolol tartrate (LOPRESSOR) 25 MG tablet TAKE ONE TABLET BY MOUTH TWICE DAILY (Patient taking differently: Take 25 mg by mouth 2 (two) times daily. ) 30 tablet 0  . montelukast (SINGULAIR) 10 MG tablet Take 10 mg by mouth at bedtime.    . niacin (NIASPAN) 500 MG CR tablet Take 500 mg by mouth at bedtime.      . pantoprazole (PROTONIX) 40 MG tablet Take 1 tablet (40 mg total) by mouth daily. 30 tablet 0  . polyethylene glycol (MIRALAX / GLYCOLAX) packet Take 17 g by mouth daily. 60 each 0  . rosuvastatin (CRESTOR) 20 MG tablet Take 1 tablet (20 mg total) by mouth daily at 6 PM.    . tamsulosin (FLOMAX) 0.4 MG CAPS capsule Take 1 capsule (0.4 mg total) by mouth daily. 30 capsule    No current facility-administered medications on file prior to visit.     LABS/IMAGING: No results found for this or any previous visit (from the past 48 hour(s)). No results found.  LIPID PANEL:    Component Value Date/Time   CHOL 127 01/01/2018 0308   CHOL 186 09/07/2017 1617   TRIG 73 01/01/2018 0308   HDL 39 (L) 01/01/2018 0308   HDL 39 (L) 09/07/2017 1617   CHOLHDL 3.3 01/01/2018 0308   VLDL 15 01/01/2018 0308   LDLCALC 73 01/01/2018 0308   LDLCALC 126 (H) 09/07/2017 1617     WEIGHTS: Wt Readings from Last 3  Encounters:  11/01/18 270 lb 3.2 oz (122.6 kg)  05/02/18 270 lb (122.5 kg)  04/27/18 268 lb (121.6 kg)    VITALS: BP 120/74   Pulse (!) 58   Ht 6' 1" (1.854 m)   Wt 270 lb 3.2 oz (122.6 kg)   BMI 35.65 kg/m   EXAM: General appearance: alert and no distress Neck: no  carotid bruit, no JVD and thyroid not enlarged, symmetric, no tenderness/mass/nodules Lungs: clear to auscultation bilaterally Heart: regular rate and rhythm Abdomen: soft, non-tender; bowel sounds normal; no masses,  no organomegaly Extremities: extremities normal, atraumatic, no cyanosis or edema Pulses: 2+ and symmetric Skin: Skin color, texture, turgor normal. No rashes or lesions Neurologic: Mental status: Alert, oriented, thought content appropriate, 4 out of 5 left arm strength Psych: Pleasant  EKG: Sinus bradycardia first-degree AV block and PVCs at 58-personally reviewed  ASSESSMENT: 1. CAD status post CABG x4 in 2005 (LIMA to LAD, SVG to OM1 and OM 2, SVG to RCA), low risk Myoview stress test with normal LV function (08/2017) - LVEF 52% 2. Hypertension 3. Dyslipidemia 4. Stroke (12/2017) - s/p ILR 5. Paroxysmal atrial flutter-anticoagulated on Eliquis - CHADVASC score of 5  PLAN: 1.   Mr. Uzelac continues to do well now 15 years after bypass surgery.  He denies any chest pain or worsening shortness of breath.  His blood pressures well controlled.  Cholesterol was last checked in November 2019 which showed an LDL of 73.  He does have history of stroke and an implanted loop recorder which shows no significant A. fib.  He does have some battery life left but desires to leave it implanted after the battery runs out.  He will remain on Eliquis.  Follow-up annually or sooner as necessary.  [name], MD, [facility], [place] Director of the Advanced Lipid Disorders &  Cardiovascular Risk Reduction Clinic Diplomate of the American Board of Clinical Lipidology Attending  Cardiologist  Direct Dial: [phone]  Fax: [phone]  Website:  [email or web address] 11/01/2018, 12:35 PM

## 2018-11-02 NOTE — Progress Notes (Signed)
Carelink Summary Report / Loop Recorder 

## 2018-11-04 ENCOUNTER — Ambulatory Visit (INDEPENDENT_AMBULATORY_CARE_PROVIDER_SITE_OTHER): Payer: Medicare Other | Admitting: Neurology

## 2018-11-04 ENCOUNTER — Other Ambulatory Visit: Payer: Self-pay

## 2018-11-04 DIAGNOSIS — G4719 Other hypersomnia: Secondary | ICD-10-CM

## 2018-11-04 DIAGNOSIS — Z951 Presence of aortocoronary bypass graft: Secondary | ICD-10-CM

## 2018-11-04 DIAGNOSIS — Z8673 Personal history of transient ischemic attack (TIA), and cerebral infarction without residual deficits: Secondary | ICD-10-CM

## 2018-11-04 DIAGNOSIS — G4720 Circadian rhythm sleep disorder, unspecified type: Secondary | ICD-10-CM

## 2018-11-04 DIAGNOSIS — E669 Obesity, unspecified: Secondary | ICD-10-CM

## 2018-11-04 DIAGNOSIS — G4733 Obstructive sleep apnea (adult) (pediatric): Secondary | ICD-10-CM

## 2018-11-04 DIAGNOSIS — R351 Nocturia: Secondary | ICD-10-CM

## 2018-11-05 ENCOUNTER — Other Ambulatory Visit: Payer: Self-pay | Admitting: Internal Medicine

## 2018-11-08 NOTE — Telephone Encounter (Signed)
Age 78, weight 112.6kg, SCr 1.54 on 01/19/18, afib indication, last OV 11/2018

## 2018-11-10 ENCOUNTER — Telehealth: Payer: Self-pay

## 2018-11-10 NOTE — Procedures (Signed)
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 He carries a prior diagnosis of OSA in the past. His diagnostic PSG on 09/27/18 showed very little sleep, AHI of 10.4/hour, REM AHI of 56.5/hour and O2 nadir of 82%. BMI of 35.6 kg/m2. The patient's neck circumference measured 17 inches.  CURRENT MEDICATIONS: Niaspan, Tylenol, Zyloprim, Norvasc, Eliquis, Lasix, Lopressor, Singulair, Protonix, Crestor, Flomax   PROCEDURE:  This is a multichannel digital polysomnogram utilizing the SomnoStar 11.2 system.  Electrodes and sensors were applied and monitored per AASM Specifications.   EEG, EOG, Chin and Limb EMG, were sampled at 200 Hz.  ECG, Snore and Nasal Pressure, Thermal Airflow, Respiratory Effort, CPAP Flow and Pressure, Oximetry was sampled at 50 Hz. Digital video and audio were recorded.      The patient was fitted with a small Simplus FFM. CPAP was initiated at 5 cmH20 with heated humidity per AASM split night standards and pressure was advanced to 18 cmH20 because of hypopneas, apneas and desaturations. Due to high pressure requirement, he was switched to BiPAP of 19/15 cm and advanced to 20/15, at which point, he started having central apneas. At a PAP pressure of 19/15 cm, he slept very briefly, AHI of 0/hour and O2 nadir was 93% during non-supine, NREM sleep.   Lights Out was at 21:45 and Lights On at 05:11. Total recording time (TRT) was 446.5 minutes, with a total sleep time (TST) of 138.5 minutes. The patient's sleep latency was  delayed; REM sleep was absent. The sleep efficiency was 31. %, which is markedly reduced.    SLEEP ARCHITECTURE: WASO (Wake after sleep onset)  was 296.5 minutes with moderate sleep fragmentation noted and one very long period of wakefulness.  There were 64 minutes in Stage N1, 66 minutes Stage N2, 8.5 minutes Stage N3 and 0 minutes in Stage REM.  The percentage of Stage N1 was 46.2%, which is markedly increased, Stage N2 was 47.7%, which is normal, Stage N3 was 6.1% and Stage R (REM sleep) was absent. The arousals were noted as: 11 were spontaneous, 0 were associated with PLMs, 9 were associated with respiratory events.  RESPIRATORY ANALYSIS:  There was a total of 26 respiratory events: 0 obstructive apneas, 4 central apneas and 0 mixed apneas with a total of 4 apneas and an apnea index (AI) of 1.7 /hour. There were 22 hypopneas with a hypopnea index of 9.5/hour. The patient also had 0 respiratory event related arousals (RERAs).      The total APNEA/HYPOPNEA INDEX  (AHI) was 11.3 /hour and the total RESPIRATORY DISTURBANCE INDEX was 11.3 /hour  0 events occurred in REM sleep and 26 events in NREM. The REM AHI was 0 /hour versus a non-REM AHI of 11.3 /hour.  The patient spent 55.5 minutes of total sleep time in the supine position and 83 minutes in non-supine. The supine AHI was 7.5, versus a non-supine AHI of 13.7.  OXYGEN SATURATION & C02:  The baseline 02 saturation was 96%, with the lowest  being 89%. Time spent below 89% saturation equaled 0 minutes.  PERIODIC LIMB MOVEMENTS:  The patient had a total of 0 Periodic Limb Movements. The Periodic Limb Movement (PLM) index was 0 and the PLM Arousal index was 0 /hour.  Audio and video analysis did not show any abnormal or unusual movements, behaviors, phonations or vocalizations. The patient took 3 bathroom breaks. Snoring was noted on CPAP, not while on BiPAP. The EKG was in keeping with normal sinus rhythm (NSR). Post-study, the patient indicated that  sleep was better than usual.   IMPRESSION: 1. Obstructive Sleep Apnea (OSA) 2. Dysfunctions associated with sleep stages or arousal from sleep   RECOMMENDATIONS: 1. This study demonstrates improved sleep disordered breathing with BiPAP therapy. I will, therefore, start the patient on home BiPAP treatment at a pressure of 19/15 cm via small full face mask with heated humidity. The patient should be reminded to be fully compliant with PAP therapy to improve sleep related symptoms and decrease long term cardiovascular risks. The patient should be reminded, that it may take up to 3 months to get fully used to using PAP with all planned sleep. The earlier full compliance is achieved, the better long term compliance tends to be. Please note that untreated obstructive sleep apnea may carry additional perioperative morbidity. Patients with significant obstructive sleep apnea should receive perioperative PAP therapy and the surgeons and particularly the anesthesiologist should be informed of the diagnosis and the severity of the sleep disordered breathing. 2. This study shows significant sleep fragmentation, poor sleep consolidation and abnormal sleep stage percentages; these are nonspecific findings and per se do not signify an intrinsic sleep disorder or a cause for the patient's sleep-related symptoms. Causes include (but are not limited to) the first night effect of the sleep study, circadian rhythm disturbances, medication effect or an underlying mood disorder or medical problem. The reduced sleep efficiency and absence of REM sleep limit this study. 3. The patient should be cautioned not to drive, work at heights, or operate dangerous or heavy equipment when tired or sleepy. Review and reiteration of good sleep hygiene measures should be pursued with any patient. 4. The patient will be seen in follow-up in the sleep clinic at [facility] for discussion of the test results, symptom and treatment compliance review,  further management strategies, etc. The referring provider will be notified of the test results.   I certify that I have reviewed the entire raw data recording prior to the issuance of this report in accordance with the Standards of Accreditation of the American Academy of Sleep Medicine (AASM)   Star Age, MD, PhD Diplomat, American Board of Neurology and Sleep Medicine (Neurology and Sleep Medicine)

## 2018-11-10 NOTE — Telephone Encounter (Signed)
I called pt to discuss his sleep study results. I called both numbers on his chart, no answer, no VM available for either one. Will try again later.

## 2018-11-10 NOTE — Progress Notes (Signed)
Patient referred by Dr. Leonie Man, seen by me on 04/27/18, diagnostic PSG on 09/27/18 for re-eval of his prior Dx of OSA.  Patient had a CPAP titration study on 11/04/18.  Please call and inform patient that I have entered an order for treatment with positive airway pressure (PAP) treatment for obstructive sleep apnea (OSA). He did fairly with BiPAP therapy; slept not very much, no REM sleep, but more sleep at 31%, compared to baseline PSG, with 18% sleep efficiency. We will, therefore, arrange for a machine for home use through a DME (durable medical equipment) company of His choice; and I will see the patient back in follow-up in about 10 weeks. Please also explain to the patient that I will be looking out for compliance data, which can be downloaded from the machine (stored on an SD card, that is inserted in the machine) or via remote access through a modem, that is built into the machine. At the time of the followup appointment we will discuss sleep study results and how it is going with PAP treatment at home. Please advise patient to bring His machine at the time of the first FU visit, even though this is cumbersome. Bringing the machine for every visit after that will likely not be needed, but often helps for the first visit to troubleshoot if needed. Please re-enforce the importance of compliance with treatment and the need for [place] to monitor compliance data - often an insurance requirement and actually good feedback for the patient as far as how they are doing.  Also remind patient, that any interim PAP machine or mask issues should be first addressed with the DME company, as they can often help better with technical and mask fit issues. Please ask if patient has a preference regarding DME company.  Please also make sure, the patient has a follow-up appointment with me in about 10 weeks from the setup date, thanks. May see one of our nurse practitioners if needed for proper timing of the FU appointment.  Please  fax or rout report to the referring provider. Thanks,   Star Age, MD, PhD [name])

## 2018-11-10 NOTE — Addendum Note (Signed)
Addended by: Star Age on: 11/10/2018 08:54 AM   Modules accepted: Orders

## 2018-11-10 NOTE — Telephone Encounter (Signed)
-----   Message from Star Age, MD sent at 11/10/2018  8:54 AM EDT ----- Patient referred by Dr. Leonie Man, seen by me on 04/27/18, diagnostic PSG on 09/27/18 for re-eval of his prior Dx of OSA.  Patient had a CPAP titration study on 11/04/18.  Please call and inform patient that I have entered an order for treatment with positive airway pressure (PAP) treatment for obstructive sleep apnea (OSA). He did fairly with BiPAP therapy; slept not very much, no REM sleep, but more sleep at 31%, compared to baseline PSG, with 18% sleep efficiency. We will, therefore, arrange for a machine for home use through a DME (durable medical equipment) company of His choice; and I will see the patient back in follow-up in about 10 weeks. Please also explain to the patient that I will be looking out for compliance data, which can be downloaded from the machine (stored on an SD card, that is inserted in the machine) or via remote access through a modem, that is built into the machine. At the time of the followup appointment we will discuss sleep study results and how it is going with PAP treatment at home. Please advise patient to bring His machine at the time of the first FU visit, even though this is cumbersome. Bringing the machine for every visit after that will likely not be needed, but often helps for the first visit to troubleshoot if needed. Please re-enforce the importance of compliance with treatment and the need for [place] to monitor compliance data - often an insurance requirement and actually good feedback for the patient as far as how they are doing.  Also remind patient, that any interim PAP machine or mask issues should be first addressed with the DME company, as they can often help better with technical and mask fit issues. Please ask if patient has a preference regarding DME company.  Please also make sure, the patient has a follow-up appointment with me in about 10 weeks from the setup date, thanks. May see one of our nurse  practitioners if needed for proper timing of the FU appointment.  Please fax or rout report to the referring provider. Thanks,   Star Age, MD, PhD [name])

## 2018-11-16 NOTE — Telephone Encounter (Signed)
I called pt. I advised pt that Dr. Rexene Alberts reviewed their sleep study results and found that pt did fairly well with a bipap during his latest sleep study. Dr. Rexene Alberts recommends that pt start a bipap at home. I reviewed PAP compliance expectations with the pt. Pt is agreeable to starting a BiPAP. I advised pt that an order will be sent to a DME, [facility], and [facility] will call the pt within about one week after they file with the pt's insurance. [facility] will show the pt how to use the machine, fit for masks, and troubleshoot the BiPAP if needed. A follow up appt was made for insurance purposes with Amy, NP on 02/06/19 at 11:30am . Pt verbalized understanding to arrive 15 minutes early and bring their BiPAP. A letter with all of this information in it will be mailed to the pt as a reminder. I verified with the pt that the address we have on file is correct. Pt verbalized understanding of results. Pt had no questions at this time but was encouraged to call back if questions arise. I have sent the order to [facility] and have received confirmation that they have received the order.  Of note, pt needed a f/u in December on Monday per his request. Keane Scrape, NP and Jinny Blossom, NP were both full on these days. Pt was scheduled with Amy, NP.

## 2018-11-29 ENCOUNTER — Ambulatory Visit (INDEPENDENT_AMBULATORY_CARE_PROVIDER_SITE_OTHER): Payer: Medicare Other | Admitting: *Deleted

## 2018-11-29 DIAGNOSIS — I639 Cerebral infarction, unspecified: Secondary | ICD-10-CM | POA: Diagnosis not present

## 2018-11-30 LAB — CUP PACEART REMOTE DEVICE CHECK
Date Time Interrogation Session: 20200929234736
Implantable Pulse Generator Implant Date: 20191104

## 2018-12-09 NOTE — Progress Notes (Signed)
Carelink Summary Report / Loop Recorder 

## 2018-12-24 ENCOUNTER — Other Ambulatory Visit: Payer: Self-pay | Admitting: Internal Medicine

## 2018-12-27 ENCOUNTER — Ambulatory Visit: Payer: Medicare Other | Admitting: Adult Health

## 2018-12-27 ENCOUNTER — Encounter: Payer: Self-pay | Admitting: Adult Health

## 2018-12-27 ENCOUNTER — Other Ambulatory Visit: Payer: Self-pay

## 2018-12-27 VITALS — BP 141/77 | HR 46 | Temp 97.4°F | Ht 73.0 in | Wt 259.0 lb

## 2018-12-27 DIAGNOSIS — I10 Essential (primary) hypertension: Secondary | ICD-10-CM | POA: Diagnosis not present

## 2018-12-27 DIAGNOSIS — G4733 Obstructive sleep apnea (adult) (pediatric): Secondary | ICD-10-CM

## 2018-12-27 DIAGNOSIS — I63411 Cerebral infarction due to embolism of right middle cerebral artery: Secondary | ICD-10-CM | POA: Diagnosis not present

## 2018-12-27 DIAGNOSIS — E785 Hyperlipidemia, unspecified: Secondary | ICD-10-CM

## 2018-12-27 DIAGNOSIS — I4892 Unspecified atrial flutter: Secondary | ICD-10-CM

## 2018-12-27 NOTE — Patient Instructions (Signed)
Continue Eliquis (apixaban) daily  and Crestor for secondary stroke prevention  Continue to follow up with PCP regarding cholesterol and blood pressure management   Ongoing use of CPAP for sleep apnea management -follow-up with Amy, NP as scheduled for initial compliance visit  Continue to follow with cardiology for atrial fibrillation and Eliquis management  Continue to monitor blood pressure at home  Maintain strict control of hypertension with blood pressure goal below 130/90, diabetes with hemoglobin A1c goal below 6.5% and cholesterol with LDL cholesterol (bad cholesterol) goal below 70 mg/dL. I also advised the patient to eat a healthy diet with plenty of whole grains, cereals, fruits and vegetables, exercise regularly and maintain ideal body weight.         Thank you for coming to see [place] at [facility]. I hope we have been able to provide you high quality care today.  You may receive a patient satisfaction survey over the next few weeks. We would appreciate your feedback and comments so that we may continue to improve ourselves and the health of our patients.

## 2018-12-27 NOTE — Progress Notes (Signed)
[name] [address] [place].  [address] [phone]       OFFICE FOLLOW UP NOTE  [name] Date of Birth:  1940-04-05 Medical Record Number:  [id number]   Referring MD: Rosalin Hawking  Reason for Referral: Stroke   HPI:   [name] is a 78 y.o. male with underlying history of CAD s/p CABG x4, HTN, HLD, MI, obesity, prior strokes and history of blood clots with recent diagnosis of right MCA infarct in 12/2017 with loop recorder inserted to assess for atrial fibrillation as potential cause of stroke which did show a dense of atrial fibrillation on 03/10/2018 with initiation of Eliquis.  He remains on Eliquis at this time without difficulty.  Residual deficits of mild left hemiparesis and endorses improvement since prior visit.  He is able to ambulate without assistive device and denies any falls.  He did undergo sleep study on 09/27/2018 which showed mild OSA but severe OSA in rem sleep therefore recommended treatment with CPAP.  He has initial CPAP compliance visit scheduled with Amy, NP in 01/2019.  He feels as though his CPAP therapy has been going well with improvement of tolerating device.  Continues on Crestor without myalgias.  Blood pressure today 141/77.  No further concerns at this time.       ROS:   14 system review of systems is positive for weakness and all other systems negative  PMH:  Past Medical History:  Diagnosis Date  . CAD (coronary artery disease)    s/p cath in 2005 and CABG x4  . Cataract   . DJD (degenerative joint disease)   . DJD (degenerative joint disease)   . Fluid retention    mild  . H/O: gout   . History of blood clots   . HTN (hypertension)   . Hyperlipidemia   . Hyperlipidemia   . Myocardial infarction ([place]) 2009  . Obesity    with reduction  . OSA (obstructive sleep apnea)    AHI 98/hr  . Renal insufficiency   . Sleep apnea   . Stroke [facility]) 2000    Social History:  Social History    Socioeconomic History  . Marital status: Married    Spouse name: [name]  . Number of children: Not on file  . Years of education: Not on file  . Highest education level: Not on file  Occupational History  . Not on file  Social Needs  . Financial resource strain: Not on file  . Food insecurity    Worry: Not on file    Inability: Not on file  . Transportation needs    Medical: Not on file    Non-medical: Not on file  Tobacco Use  . Smoking status: Former Smoker    Types: Cigars  . Smokeless tobacco: Never Used  . Tobacco comment: used to smoke cigars, 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, none since  Substance and Sexual Activity  . Alcohol use: No  . Drug use: No  . Sexual activity: Not on file  Lifestyle  . Physical activity    Days per week: Not on file    Minutes per session: Not on file  . Stress: Not on file  Relationships  . Social Herbalist on phone: Not on file    Gets together: Not on file    Attends religious service: Not on file    Active member of club or organization: Not on file  Attends meetings of clubs or organizations: Not on file    Relationship status: Not on file  . Intimate partner violence    Fear of current or ex partner: Not on file    Emotionally abused: Not on file    Physically abused: Not on file    Forced sexual activity: Not on file  Other Topics Concern  . Not on file  Social History Narrative  . Not on file    Medications:   Current Outpatient Medications on File Prior to Visit  Medication Sig Dispense Refill  . acetaminophen (TYLENOL) 325 MG tablet Take 1-2 tablets (325-650 mg total) by mouth every 4 (four) hours as needed for mild pain.    [name] allopurinol (ZYLOPRIM) 100 MG tablet Take 200 mg by mouth daily.     [name] amLODipine (NORVASC) 5 MG tablet Take 1 tablet by mouth once daily 90 tablet 2  . ELIQUIS 5 MG TABS tablet Take 1 tablet by mouth twice daily 180 tablet 1  . furosemide (LASIX) 20 MG tablet Take 1/2 (one-half)  tablet by mouth once daily 30 tablet 1  . Menthol-Methyl Salicylate (MUSCLE RUB) 10-15 % CREA Apply 1 application topically 4 (four) times daily -  with meals and at bedtime. To neck and shoulders  0  . metoprolol tartrate (LOPRESSOR) 25 MG tablet TAKE ONE TABLET BY MOUTH TWICE DAILY (Patient taking differently: Take 25 mg by mouth 2 (two) times daily. ) 30 tablet 0  . montelukast (SINGULAIR) 10 MG tablet Take 10 mg by mouth at bedtime.    . niacin (NIASPAN) 500 MG CR tablet Take 500 mg by mouth at bedtime.      . pantoprazole (PROTONIX) 40 MG tablet Take 1 tablet (40 mg total) by mouth daily. 30 tablet 0  . polyethylene glycol (MIRALAX / GLYCOLAX) packet Take 17 g by mouth daily. 60 each 0  . potassium chloride (KLOR-CON) 10 MEQ tablet Take 10 mEq by mouth daily.    . rosuvastatin (CRESTOR) 20 MG tablet Take 1 tablet (20 mg total) by mouth daily at 6 PM.    . tamsulosin (FLOMAX) 0.4 MG CAPS capsule Take 1 capsule (0.4 mg total) by mouth daily. 30 capsule    No current facility-administered medications on file prior to visit.     Allergies:   Allergies  Allergen Reactions  . Ramipril Cough    Physical Exam  Today's Vitals   12/27/18 1034  BP: (!) 141/77  Pulse: (!) 46  Temp: (!) 97.4 F (36.3 C)  TempSrc: Oral  Weight: 259 lb (117.5 kg)  Height: 6\' 1"  (1.854 m)   Body mass index is 34.17 kg/m.  General: well developed, well nourished,  pleasant elderly African-American male, seated, in no evident distress Head: head normocephalic and atraumatic.   Neck: supple with no carotid or supraclavicular bruits Cardiovascular: regular rate and rhythm, no murmurs Musculoskeletal: Left hand fifth digit amputation Skin:  no rash/petichiae Vascular:  Normal pulses all extremities   Neurologic Exam Mental Status: Awake and fully alert. Oriented to place and time. Recent and remote memory intact. Attention span, concentration and fund of knowledge appropriate. Mood and affect appropriate.   Cranial Nerves: Pupils equal, briskly reactive to light. Extraocular movements full without nystagmus. Visual fields full to confrontation. Hearing intact. Facial sensation intact. Face, tongue, palate moves normally and symmetrically.  Motor: Normal bulk and tone. Normal strength in all tested extremity muscles except mildly decreased strength LUE and weak grip strength. Sensory.: intact to touch ,  pinprick , position and vibratory sensation.  Coordination: Rapid alternating movements normal in all extremities except decreased left hand finger tapping. Finger-to-nose and heel-to-shin performed accurately bilaterally. Gait and Station: Arises from chair without difficulty. Stance is normal. Gait demonstrates normal stride length and balance Reflexes: 1+ and symmetric. Toes downgoing.      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  Vascular risk factors of obesity, hypertension, hyperlipidemia,atrial flutter, and new diagnosis of OSA on CPAP.  Residual deficits of mild LUE weakness but overall stable    PLAN: 1. Right MCA infarct: Continue Eliquis (apixaban) daily  and Crestor 20 mg for secondary stroke prevention. Maintain strict control of hypertension with blood pressure goal below 130/90, diabetes with hemoglobin A1c goal below 6.5% and cholesterol with LDL cholesterol (bad cholesterol) goal below 70 mg/dL.  I also advised the patient to eat a healthy diet with plenty of whole grains, cereals, fruits and vegetables, exercise regularly with at least 30 minutes of continuous activity daily and maintain ideal body weight.  Restart therapies once COVID-19 safety precautions lifted.  Advised to continue to perform home exercises 2. HTN: Advised to continue current treatment regimen. Advised to continue to monitor at home along with continued follow-up with PCP for management 3.  HLD: Advised to continue current treatment regimen along with continued follow-up with PCP for future prescribing and monitoring of lipid panel 4. OSA: Continue compliance with CPAP for OSA management with initial compliance visit in 01/2019 with Amy, NP 5. Atrial flutter: Continuation of Eliquis and ongoing follow-up with cardiologist Dr. Lovena Le.    Stable from stroke standpoint recommend follow-up as needed  Greater than 50% of time during this 25 minute visit was spent on counseling,explanation of diagnosis of right MCA infarct, reviewing risk factor management of HTN, HLD, atrial flutter and new diagnosis of OSA, planning of further management, discussion with patient and family and coordination of care   Frann Rider, [facility]  [facility] [address] [place], [place] [address]  Phone [phone] Fax [phone] Note: This document was prepared with digital dictation and possible smart phrase technology. Any transcriptional errors that result from this process are unintentional.

## 2018-12-27 NOTE — Progress Notes (Signed)
I agree with the above plan 

## 2019-01-02 ENCOUNTER — Ambulatory Visit (INDEPENDENT_AMBULATORY_CARE_PROVIDER_SITE_OTHER): Payer: Medicare Other | Admitting: *Deleted

## 2019-01-02 DIAGNOSIS — I639 Cerebral infarction, unspecified: Secondary | ICD-10-CM | POA: Diagnosis not present

## 2019-01-02 LAB — CUP PACEART REMOTE DEVICE CHECK
Date Time Interrogation Session: 20201101235041
Implantable Pulse Generator Implant Date: 20191104

## 2019-01-16 ENCOUNTER — Telehealth: Payer: Self-pay

## 2019-01-16 NOTE — Telephone Encounter (Signed)
Spoke with patient regarding disconnected monitor 

## 2019-01-24 NOTE — Progress Notes (Signed)
Carelink Summary Report / Loop Recorder 

## 2019-02-01 ENCOUNTER — Ambulatory Visit: Payer: Self-pay | Admitting: Family Medicine

## 2019-02-01 ENCOUNTER — Ambulatory Visit: Payer: Self-pay | Admitting: Neurology

## 2019-02-01 NOTE — Progress Notes (Deleted)
PATIENT: [name] DOB: [birth year]  REASON FOR VISIT: follow up HISTORY FROM: patient  No chief complaint on file.    HISTORY OF PRESENT ILLNESS: Today 02/01/19 [name] is a 78 y.o. male here today for follow up for OSA on BiPAP.    HISTORY: (copied from Dr Guadelupe Sabin note on 04/27/2018)  Dear [name],   I saw your patient, [name], upon your kind request in my sleep clinic today for initial consultation of his sleep disorder, in particular, concern for underlying obstructive sleep apnea. The patient is accompanied by his wife today. As you know, Mr. 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 He has not been on CPAP therapy in years, but used CPAP in the past. He has an old CPAP machine at home. Prior sleep study results are not available for my review today. A CPAP download is not available either. I reviewed your office note from 03/15/2018. His Epworth sleepiness score is 13 out of 24 today, fatigue score is 10 out of 63. He lives with his wife, they have 3 children. He does not smoke or drink alcohol, he does not utilize caffeine on a regular basis. He is in outpt PT still, also OT.  His BT is around 10 PM, and rise time around 10 or 11 AM. He has nocturia about 3 times per night. He has been a long sleeper for as long as he can remember. 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 He has lost about 30 lb since November. His main complaint about CPAP in the past was his sleep interruption, having to go to the bathroom and he found CPAP disruptive and cumbersome to use, he used a fullface mask in the past. He would be willing to get  retested and consider CPAP therapy again. His brother had sleep apnea. He had a tonsillectomy as a teenager. He tends to watch TV in the bedroom, TV can stay on all night at times. No pets in the household.   REVIEW OF SYSTEMS: Out of a complete 14 system review of symptoms, the patient complains only of the following symptoms, and all other reviewed systems are negative.  ALLERGIES: Allergies  Allergen Reactions  . Ramipril Cough    HOME MEDICATIONS: Outpatient Medications Prior to Visit  Medication Sig Dispense Refill  . acetaminophen (TYLENOL) 325 MG tablet Take 1-2 tablets (325-650 mg total) by mouth every 4 (four) hours as needed for mild pain.    [name] allopurinol (ZYLOPRIM) 100 MG tablet Take 200 mg by mouth daily.     [name] amLODipine (NORVASC) 5 MG tablet Take 1 tablet by mouth once daily 90 tablet 2  . ELIQUIS 5 MG TABS tablet Take 1 tablet by mouth twice daily 180 tablet 1  . furosemide (LASIX) 20 MG tablet Take 1/2 (one-half) tablet by mouth once daily 30 tablet 1  . Menthol-Methyl Salicylate (MUSCLE RUB) 10-15 % CREA Apply 1 application topically 4 (four) times daily -  with meals and at bedtime. To neck and shoulders  0  . metoprolol tartrate (LOPRESSOR) 25 MG tablet TAKE ONE TABLET BY MOUTH TWICE DAILY (Patient taking differently: Take 25 mg by mouth 2 (two) times daily. ) 30  tablet 0  . montelukast (SINGULAIR) 10 MG tablet Take 10 mg by mouth at bedtime.    . niacin (NIASPAN) 500 MG CR tablet Take 500 mg by mouth at bedtime.      . pantoprazole (PROTONIX) 40 MG tablet Take 1 tablet (40 mg total) by mouth daily. 30 tablet 0  . polyethylene glycol (MIRALAX / GLYCOLAX) packet Take 17 g by mouth daily. 60 each 0  . potassium chloride (KLOR-CON) 10 MEQ tablet Take 10 mEq by mouth daily.    . rosuvastatin (CRESTOR) 20 MG tablet Take 1 tablet (20 mg total) by mouth daily at 6 PM.    . tamsulosin (FLOMAX) 0.4 MG CAPS capsule Take 1 capsule (0.4 mg total) by mouth daily. 30 capsule    No  facility-administered medications prior to visit.     PAST MEDICAL HISTORY: Past Medical History:  Diagnosis Date  . CAD (coronary artery disease)    s/p cath in 2005 and CABG x4  . Cataract   . DJD (degenerative joint disease)   . DJD (degenerative joint disease)   . Fluid retention    mild  . H/O: gout   . History of blood clots   . HTN (hypertension)   . Hyperlipidemia   . Hyperlipidemia   . Myocardial infarction ([place]) 2009  . Obesity    with reduction  . OSA (obstructive sleep apnea)    AHI 98/hr  . Renal insufficiency   . Sleep apnea   . Stroke [facility]) 2000    PAST SURGICAL HISTORY: Past Surgical History:  Procedure Laterality Date  . CORONARY ARTERY BYPASS GRAFT  2005   LIMA to LAD, SVG to OM1 and OM 2, RCA.   [name] left inguinal hernia repair    . LOOP RECORDER INSERTION N/A 01/03/2018   Procedure: LOOP RECORDER INSERTION;  Surgeon: Evans Lance, MD;  Location: [place];  Service: Cardiovascular;  Laterality: N/A;  . TEE WITHOUT CARDIOVERSION N/A 12/11/2016   Procedure: TRANSESOPHAGEAL ECHOCARDIOGRAM (TEE);  Surgeon: [name], MD;  Location: [facility] ENDOSCOPY;  Service: Cardiovascular;  Laterality: N/A;  . TEE WITHOUT CARDIOVERSION N/A 01/03/2018   Procedure: TRANSESOPHAGEAL ECHOCARDIOGRAM (TEE);  Surgeon: Dorothy Spark, MD;  Location: [facility] ENDOSCOPY;  Service: Cardiovascular;  Laterality: N/A;    FAMILY HISTORY: Family History  Problem Relation Age of Onset  . Gout Mother   . Stroke Mother   . Coronary artery disease Mother   . Hypertension Mother   . Arthritis Mother   . Coronary artery disease Father   . Arthritis Father   . Gout Father   . Stroke Father   . Hypertension Father   . Coronary artery disease Brother   . Arthritis Brother   . Gout Brother   . Stroke Brother   . Hypertension Brother   . Coronary artery disease Brother   . Arthritis Brother   . Gout Brother   . Stroke Brother   . Hypertension Brother   . Coronary artery  disease Other        significant in multiple family members    SOCIAL HISTORY: Social History   Socioeconomic History  . Marital status: Married    Spouse name: [name]  . Number of children: Not on file  . Years of education: Not on file  . Highest education level: Not on file  Occupational History  . Not on file  Social Needs  . Financial resource strain: Not on file  . Food insecurity  Worry: Not on file    Inability: Not on file  . Transportation needs    Medical: Not on file    Non-medical: Not on file  Tobacco Use  . Smoking status: Former Smoker    Types: Cigars  . Smokeless tobacco: Never Used  . Tobacco comment: used to smoke cigars, 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, none since  Substance and Sexual Activity  . Alcohol use: No  . Drug use: No  . Sexual activity: Not on file  Lifestyle  . Physical activity    Days per week: Not on file    Minutes per session: Not on file  . Stress: Not on file  Relationships  . Social Herbalist on phone: Not on file    Gets together: Not on file    Attends religious service: Not on file    Active member of club or organization: Not on file    Attends meetings of clubs or organizations: Not on file    Relationship status: Not on file  . Intimate partner violence    Fear of current or ex partner: Not on file    Emotionally abused: Not on file    Physically abused: Not on file    Forced sexual activity: Not on file  Other Topics Concern  . Not on file  Social History Narrative  . Not on file      PHYSICAL EXAM  There were no vitals filed for this visit. There is no height or weight on file to calculate BMI.  Generalized: Well developed, in no acute distress  Cardiology: normal rate and rhythm, no murmur noted Neurological examination  Mentation: Alert oriented to time, place, history taking. Follows all commands speech and language fluent Cranial nerve II-XII: Pupils were equal round reactive to light.  Extraocular movements were full, visual field were full on confrontational test. Facial sensation and strength were normal. Uvula tongue midline. Head turning and shoulder shrug  were normal and symmetric. Motor: The motor testing reveals 5 over 5 strength of all 4 extremities. Good symmetric motor tone is noted throughout.  Sensory: Sensory testing is intact to soft touch on all 4 extremities. No evidence of extinction is noted.  Coordination: Cerebellar testing reveals good finger-nose-finger and heel-to-shin bilaterally.  Gait and station: Gait is normal. Tandem gait is normal. Romberg is negative. No drift is seen.  Reflexes: Deep tendon reflexes are symmetric and normal bilaterally.   DIAGNOSTIC DATA (LABS, IMAGING, TESTING) - I reviewed patient records, labs, notes, testing and imaging myself where available.  No flowsheet data found.   Lab Results  Component Value Date   WBC 6.3 01/19/2018   HGB 11.3 (L) 01/19/2018   HCT 37.0 (L) 01/19/2018   MCV 86.0 01/19/2018   PLT 242 01/19/2018      Component Value Date/Time   NA 141 01/19/2018 0554   NA 143 09/07/2017 1617   K 4.2 01/19/2018 0554   CL 106 01/19/2018 0554   CO2 27 01/19/2018 0554   GLUCOSE 98 01/19/2018 0554   BUN 16 01/19/2018 0554   BUN 21 09/07/2017 1617   CREATININE 1.54 (H) 01/19/2018 0554   CALCIUM 9.8 01/19/2018 0554   PROT 7.3 01/06/2018 0532   PROT 7.5 09/07/2017 1617   ALBUMIN 2.9 (L) 01/06/2018 0532   ALBUMIN 4.1 09/07/2017 1617   AST 17 01/06/2018 0532   ALT 12 01/06/2018 0532   ALKPHOS 45 01/06/2018 0532   BILITOT 0.8 01/06/2018 0532  BILITOT 0.5 09/07/2017 1617   GFRNONAA 42 (L) 01/19/2018 0554   GFRAA 49 (L) 01/19/2018 0554   Lab Results  Component Value Date   CHOL 127 01/01/2018   HDL 39 (L) 01/01/2018   LDLCALC 73 01/01/2018   TRIG 73 01/01/2018   CHOLHDL 3.3 01/01/2018   Lab Results  Component Value Date   HGBA1C 5.8 (H) 01/01/2018   No results found for: [id number] Lab Results   Component Value Date   TSH 1.550 09/07/2017       ASSESSMENT AND PLAN 78 y.o. year old male  has a past medical history of CAD (coronary artery disease), Cataract, DJD (degenerative joint disease), DJD (degenerative joint disease), Fluid retention, H/O: gout, History of blood clots, HTN (hypertension), Hyperlipidemia, Hyperlipidemia, Myocardial infarction ([place]) (2009), Obesity, OSA (obstructive sleep apnea), Renal insufficiency, Sleep apnea, and Stroke ([place]) (2000). here with ***    ICD-10-CM   1. OSA treated with BiPAP  G47.33        No orders of the defined types were placed in this encounter.    No orders of the defined types were placed in this encounter.     I spent 15 minutes with the patient. 50% of this time was spent counseling and educating patient on plan of care and medications.    Debbora Presto, FNP-C 02/01/2019, 7:31 AM [facility] [address] [place], [place] [address] [phone]

## 2019-02-03 ENCOUNTER — Ambulatory Visit (INDEPENDENT_AMBULATORY_CARE_PROVIDER_SITE_OTHER): Payer: Medicare Other | Admitting: *Deleted

## 2019-02-03 DIAGNOSIS — I639 Cerebral infarction, unspecified: Secondary | ICD-10-CM

## 2019-02-04 LAB — CUP PACEART REMOTE DEVICE CHECK
Date Time Interrogation Session: 20201204185117
Implantable Pulse Generator Implant Date: 20191104

## 2019-02-06 ENCOUNTER — Ambulatory Visit: Payer: Self-pay | Admitting: Family Medicine

## 2019-02-10 ENCOUNTER — Emergency Department (HOSPITAL_COMMUNITY): Payer: Medicare Other

## 2019-02-10 ENCOUNTER — Emergency Department (HOSPITAL_COMMUNITY)
Admission: EM | Admit: 2019-02-10 | Discharge: 2019-02-10 | Disposition: A | Discharge status: Home / Self Care | Payer: Medicare Other | Attending: Emergency Medicine | Admitting: Emergency Medicine

## 2019-02-10 ENCOUNTER — Other Ambulatory Visit: Payer: Self-pay

## 2019-02-10 ENCOUNTER — Encounter (HOSPITAL_COMMUNITY): Payer: Self-pay | Admitting: *Deleted

## 2019-02-10 DIAGNOSIS — Z951 Presence of aortocoronary bypass graft: Secondary | ICD-10-CM | POA: Diagnosis not present

## 2019-02-10 DIAGNOSIS — R197 Diarrhea, unspecified: Secondary | ICD-10-CM

## 2019-02-10 DIAGNOSIS — I129 Hypertensive chronic kidney disease with stage 1 through stage 4 chronic kidney disease, or unspecified chronic kidney disease: Secondary | ICD-10-CM | POA: Insufficient documentation

## 2019-02-10 DIAGNOSIS — U071 COVID-19: Secondary | ICD-10-CM | POA: Insufficient documentation

## 2019-02-10 DIAGNOSIS — I2510 Atherosclerotic heart disease of native coronary artery without angina pectoris: Secondary | ICD-10-CM | POA: Insufficient documentation

## 2019-02-10 DIAGNOSIS — Z20822 Contact with and (suspected) exposure to covid-19: Secondary | ICD-10-CM

## 2019-02-10 DIAGNOSIS — R112 Nausea with vomiting, unspecified: Secondary | ICD-10-CM | POA: Diagnosis present

## 2019-02-10 DIAGNOSIS — Z87891 Personal history of nicotine dependence: Secondary | ICD-10-CM | POA: Insufficient documentation

## 2019-02-10 DIAGNOSIS — Z7901 Long term (current) use of anticoagulants: Secondary | ICD-10-CM | POA: Insufficient documentation

## 2019-02-10 DIAGNOSIS — N183 Chronic kidney disease, stage 3 unspecified: Secondary | ICD-10-CM | POA: Insufficient documentation

## 2019-02-10 DIAGNOSIS — I252 Old myocardial infarction: Secondary | ICD-10-CM | POA: Insufficient documentation

## 2019-02-10 DIAGNOSIS — R059 Cough, unspecified: Secondary | ICD-10-CM

## 2019-02-10 DIAGNOSIS — Z79899 Other long term (current) drug therapy: Secondary | ICD-10-CM | POA: Diagnosis not present

## 2019-02-10 DIAGNOSIS — Z8673 Personal history of transient ischemic attack (TIA), and cerebral infarction without residual deficits: Secondary | ICD-10-CM | POA: Insufficient documentation

## 2019-02-10 DIAGNOSIS — R05 Cough: Secondary | ICD-10-CM

## 2019-02-10 LAB — LIPASE, BLOOD: Lipase: 30 U/L (ref 11–51)

## 2019-02-10 LAB — CBC
HCT: 43.1 % (ref 39.0–52.0)
Hemoglobin: 13.7 g/dL (ref 13.0–17.0)
MCH: 28.1 pg (ref 26.0–34.0)
MCHC: 31.8 g/dL (ref 30.0–36.0)
MCV: 88.3 fL (ref 80.0–100.0)
Platelets: 193 10*3/uL (ref 150–400)
RBC: 4.88 MIL/uL (ref 4.22–5.81)
RDW: 15 % (ref 11.5–15.5)
WBC: 5.4 10*3/uL (ref 4.0–10.5)
nRBC: 0 % (ref 0.0–0.2)

## 2019-02-10 LAB — URINALYSIS, ROUTINE W REFLEX MICROSCOPIC
Bacteria, UA: NONE SEEN
Bilirubin Urine: NEGATIVE
Glucose, UA: NEGATIVE mg/dL
Hgb urine dipstick: NEGATIVE
Ketones, ur: NEGATIVE mg/dL
Leukocytes,Ua: NEGATIVE
Nitrite: NEGATIVE
Protein, ur: 30 mg/dL — AB
Specific Gravity, Urine: 1.013 (ref 1.005–1.030)
pH: 5 (ref 5.0–8.0)

## 2019-02-10 LAB — COMPREHENSIVE METABOLIC PANEL
ALT: 19 U/L (ref 0–44)
AST: 27 U/L (ref 15–41)
Albumin: 3.4 g/dL — ABNORMAL LOW (ref 3.5–5.0)
Alkaline Phosphatase: 46 U/L (ref 38–126)
Anion gap: 10 (ref 5–15)
BUN: 23 mg/dL (ref 8–23)
CO2: 25 mmol/L (ref 22–32)
Calcium: 9.9 mg/dL (ref 8.9–10.3)
Chloride: 105 mmol/L (ref 98–111)
Creatinine, Ser: 1.57 mg/dL — ABNORMAL HIGH (ref 0.61–1.24)
GFR calc Af Amer: 49 mL/min — ABNORMAL LOW (ref >60)
GFR calc non Af Amer: 42 mL/min — ABNORMAL LOW (ref >60)
Glucose, Bld: 116 mg/dL — ABNORMAL HIGH (ref 70–99)
Potassium: 3.9 mmol/L (ref 3.5–5.1)
Sodium: 140 mmol/L (ref 135–145)
Total Bilirubin: 0.4 mg/dL (ref 0.3–1.2)
Total Protein: 7.6 g/dL (ref 6.5–8.1)

## 2019-02-10 LAB — MAGNESIUM: Magnesium: 2.1 mg/dL (ref 1.7–2.4)

## 2019-02-10 MED ORDER — SODIUM CHLORIDE 0.9% FLUSH: 3.0000 mL | Freq: Once | INTRAVENOUS | Status: DC

## 2019-02-10 MED ORDER — ONDANSETRON 8 MG PO TBDP: 8.0000 mg | ORAL_TABLET | Freq: Three times a day (TID) | ORAL | 0 refills | Status: DC | PRN

## 2019-02-10 NOTE — ED Triage Notes (Signed)
Pt states he has had diarrhea and vomiting for 3 days, 2 loose stools today, 2 emesis yesterday, more the day before but can not define.

## 2019-02-10 NOTE — ED Provider Notes (Signed)
[facility] DEPT Provider Note   CSN: [id number] Arrival date & time: 02/10/19  1207     History Chief Complaint  Patient presents with  . Emesis  . Diarrhea    [name] is a 78 y.o. male.  HPI     79 year old comes in a chief complaint of vomiting and diarrhea. Patient has history of CAD, hypertension, hyperlipidemia, OSA and stroke. According to the patient he started having vomiting yesterday.  He had emesis x2 yesterday and none so far today.  He has loss of appetite and loss of energy without any abdominal pain.  He has had 2 loose bowel movements today which were nonbloody.  Patient has a mild cough into 2 days.  He denies any fevers, chills, body aches.  No sick contacts.  Wife had the same type of food and has no symptoms.  Past Medical History:  Diagnosis Date  . CAD (coronary artery disease)    s/p cath in 2005 and CABG x4  . Cataract   . DJD (degenerative joint disease)   . DJD (degenerative joint disease)   . Fluid retention    mild  . H/O: gout   . History of blood clots   . HTN (hypertension)   . Hyperlipidemia   . Hyperlipidemia   . Myocardial infarction ([place]) 2009  . Obesity    with reduction  . OSA (obstructive sleep apnea)    AHI 98/hr  . Renal insufficiency   . Sleep apnea   . Stroke [facility]) 2000    Patient Active Problem List   Diagnosis Date Noted  . Abnormal peripheral vision, left 01/21/2018  . Anemia, chronic disease   . Left hemiparesis ([place])   . Acute ischemic cerebrovascular accident (CVA) involving right middle cerebral artery territory [facility])   . CKD (chronic kidney disease), stage III   . Peripheral edema   . Stroke (cerebrum) ([place]) 01/05/2018  . History of gout   . AKI (acute kidney injury) ([place])   . Stage 3 chronic kidney disease   . Anemia of chronic disease   . Left-sided weakness   . Osteoarthritis   . OSA (obstructive sleep apnea)   . Noncompliance with CPAP treatment   . History  of DVT (deep vein thrombosis)   . History of CVA (cerebrovascular accident)   . Dysphagia, post-stroke   . Sinus tachycardia   . Acute blood loss anemia   . Acute CVA (cerebrovascular accident) ([place]) 01/01/2018  . Hypertensive urgency 01/01/2018  . CKD (chronic kidney disease) stage 2, GFR 60-89 ml/min 01/01/2018  . 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 Generalized weakness   . Sepsis ([place]) 12/10/2016  . Bacteremia due to Streptococcus 12/10/2016  . Lactic acidosis 12/10/2016  . Elevated troponin 12/10/2016  . Renal insufficiency 12/10/2016  . Hypophosphatemia 12/10/2016  . Left leg swelling 12/10/2016  . Febrile illness 12/09/2016  . Febrile illness, acute   . OBESITY, UNSPECIFIED 05/31/2009  . Hyperlipidemia 05/30/2009  . Obstructive sleep apnea 05/30/2009  . Essential hypertension 05/30/2009  . CAD (coronary artery disease) 05/30/2009    Past Surgical History:  Procedure Laterality Date  . CORONARY ARTERY BYPASS GRAFT  2005   LIMA to LAD, SVG to OM1 and OM 2, RCA.   [name] left inguinal hernia repair    . LOOP RECORDER INSERTION N/A 01/03/2018   Procedure: LOOP RECORDER INSERTION;  Surgeon: Evans Lance, MD;  Location: [facility] CV LAB;  Service: Cardiovascular;  Laterality:  N/A;  . TEE WITHOUT CARDIOVERSION N/A 12/11/2016   Procedure: TRANSESOPHAGEAL ECHOCARDIOGRAM (TEE);  Surgeon: [name], MD;  Location: [facility] ENDOSCOPY;  Service: Cardiovascular;  Laterality: N/A;  . TEE WITHOUT CARDIOVERSION N/A 01/03/2018   Procedure: TRANSESOPHAGEAL ECHOCARDIOGRAM (TEE);  Surgeon: Dorothy Spark, MD;  Location: [facility] ENDOSCOPY;  Service: Cardiovascular;  Laterality: N/A;       Family History  Problem Relation Age of Onset  . Gout Mother   . Stroke Mother   . Coronary artery disease Mother   . Hypertension Mother   . Arthritis Mother   . Coronary artery disease Father   . Arthritis Father   . Gout Father   . Stroke Father   . Hypertension Father    . Coronary artery disease Brother   . Arthritis Brother   . Gout Brother   . Stroke Brother   . Hypertension Brother   . Coronary artery disease Brother   . Arthritis Brother   . Gout Brother   . Stroke Brother   . Hypertension Brother   . Coronary artery disease Other        significant in multiple family members    Social History   Tobacco Use  . Smoking status: Former Smoker    Types: Cigars  . Smokeless tobacco: Never Used  . Tobacco comment: used to smoke cigars, 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, none since  Substance Use Topics  . Alcohol use: No  . Drug use: No    Home Medications Prior to Admission medications   Medication Sig Start Date End Date Taking? Authorizing Provider  acetaminophen (TYLENOL) 325 MG tablet Take 1-2 tablets (325-650 mg total) by mouth every 4 (four) hours as needed for mild pain. 01/21/18  Yes Love, Ivan Anchors, PA-C  allopurinol (ZYLOPRIM) 100 MG tablet Take 200 mg by mouth daily.    Yes Rogers Blocker, MD  amLODipine (NORVASC) 5 MG tablet Take 1 tablet by mouth once daily 07/06/18  Yes Hilty, Nadean Corwin, MD  ELIQUIS 5 MG TABS tablet Take 1 tablet by mouth twice daily 11/08/18  Yes Evans Lance, MD  furosemide (LASIX) 20 MG tablet Take 1/2 (one-half) tablet by mouth once daily Patient taking differently: Take 10 mg by mouth daily.  12/26/18  Yes Hilty, Nadean Corwin, MD  metoprolol tartrate (LOPRESSOR) 25 MG tablet TAKE ONE TABLET BY MOUTH TWICE DAILY Patient taking differently: Take 25 mg by mouth 2 (two) times daily.  11/12/14  Yes Fay Records, MD  montelukast (SINGULAIR) 10 MG tablet Take 10 mg by mouth at bedtime. 11/05/16  Yes [provider]  niacin (NIASPAN) 500 MG CR tablet Take 500 mg by mouth at bedtime.     Yes [provider]  pantoprazole (PROTONIX) 40 MG tablet Take 1 tablet (40 mg total) by mouth daily. 01/21/18  Yes Love, Ivan Anchors, PA-C  polyethylene glycol (MIRALAX / GLYCOLAX) packet Take 17 g by mouth daily. Patient  taking differently: Take 17 g by mouth daily as needed for mild constipation.  01/21/18  Yes Love, Ivan Anchors, PA-C  potassium chloride (KLOR-CON) 10 MEQ tablet Take 10 mEq by mouth daily. 12/26/18  Yes [provider]  rosuvastatin (CRESTOR) 10 MG tablet Take 10 mg by mouth at bedtime. 01/11/19  Yes [provider]  tamsulosin (FLOMAX) 0.4 MG CAPS capsule Take 1 capsule (0.4 mg total) by mouth daily. 01/05/18  Yes Thurnell Lose, MD  Menthol-Methyl Salicylate (MUSCLE RUB) 10-15 % CREA  Apply 1 application topically 4 (four) times daily -  with meals and at bedtime. To neck and shoulders Patient not taking: Reported on 02/10/2019 01/21/18   Love, Ivan Anchors, PA-C  ondansetron (ZOFRAN ODT) 8 MG disintegrating tablet Take 1 tablet (8 mg total) by mouth every 8 (eight) hours as needed for nausea. 02/10/19   Varney Biles, MD    Allergies    Ramipril  Review of Systems   Review of Systems  Constitutional: Positive for activity change and fatigue. Negative for fever.  Respiratory: Positive for cough.   Cardiovascular: Negative for chest pain.  Gastrointestinal: Positive for diarrhea, nausea and vomiting.  Allergic/Immunologic: Negative for immunocompromised state.  All other systems reviewed and are negative.   Physical Exam Updated Vital Signs BP 121/76 (BP Location: Right Arm)   Pulse 65   Temp 98.1 F (36.7 C) (Oral)   Resp 18   Ht 6\' 2"  (1.88 m)   Wt 111.8 kg   SpO2 99%   BMI 31.64 kg/m   Physical Exam Vitals and nursing note reviewed.  Constitutional:      Appearance: He is well-developed.  HENT:     Head: Atraumatic.     Mouth/Throat:     Comments: Dry mucosa Cardiovascular:     Rate and Rhythm: Normal rate.  Pulmonary:     Effort: Pulmonary effort is normal.  Abdominal:     Tenderness: There is no abdominal tenderness.  Musculoskeletal:     Cervical back: Neck supple.  Skin:    General: Skin is warm.  Neurological:     Mental Status: He is  alert and oriented to person, place, and time.     ED Results / Procedures / Treatments   Labs (all labs ordered are listed, but only abnormal results are displayed) Labs Reviewed  COMPREHENSIVE METABOLIC PANEL - Abnormal; Notable for the following components:      Result Value   Glucose, Bld 116 (*)    Creatinine, Ser 1.57 (*)    Albumin 3.4 (*)    GFR calc non Af Amer 42 (*)    GFR calc Af Amer 49 (*)    All other components within normal limits  URINALYSIS, ROUTINE W REFLEX MICROSCOPIC - Abnormal; Notable for the following components:   Protein, ur 30 (*)    All other components within normal limits  NOVEL CORONAVIRUS, NAA (HOSP ORDER, SEND-OUT TO REF LAB; TAT 18-24 HRS)  LIPASE, BLOOD  CBC  MAGNESIUM    EKG None  Radiology DG Chest Port 1 View  Result Date: 02/10/2019 CLINICAL DATA:  Cough, diarrhea, vomiting EXAM: PORTABLE CHEST 1 VIEW COMPARISON:  11/07/2017 FINDINGS: Cardiomegaly status post median sternotomy and CABG. Implantable loop recorder. Both lungs are clear. The visualized skeletal structures are unremarkable. IMPRESSION: Cardiomegaly without acute abnormality of the lungs in AP portable projection. Electronically Signed   By: Eddie Candle M.D.   On: 02/10/2019 19:17    Procedures Procedures (including critical care time)  Medications Ordered in ED Medications  sodium chloride flush (NS) 0.9 % injection 3 mL (has no administration in time range)    ED Course  I have reviewed the triage vital signs and the nursing notes.  Pertinent labs & imaging results that were available during my care of the patient were reviewed by me and considered in my medical decision making (see chart for details).    MDM Rules/Calculators/A&P  RONELLE ATTIG was evaluated in Emergency Department on 02/10/2019 for the symptoms described in the history of present illness. He was evaluated in the context of the global COVID-19 pandemic, which  necessitated consideration that the patient might be at risk for infection with the SARS-CoV-2 virus that causes COVID-19. Institutional protocols and algorithms that pertain to the evaluation of patients at risk for COVID-19 are in a state of rapid change based on information released by regulatory bodies including the CDC and federal and state organizations. These policies and algorithms were followed during the patient's care in the ED.   78 year old male comes to the ER with chief complaint of nausea, vomiting, diarrhea.  He is also feeling profoundly weak and has a mild cough.  No anosmia.  Abdominal exam is benign.  He appears slightly dry on mucosal exam.  No associated dizziness.  Labs ordered and overall reassuring and at baseline levels.  COVID-19 is possible.  Doubt that there is colitis, gastroenteritis.  Chest x-ray also ordered given the cough.   Final Clinical Impression(s) / ED Diagnoses Final diagnoses:  Nausea vomiting and diarrhea  Cough  Suspected COVID-19 virus infection    Rx / DC Orders ED Discharge Orders         Ordered    ondansetron (ZOFRAN ODT) 8 MG disintegrating tablet  Every 8 hours PRN     02/10/19 1950           Varney Biles, MD 02/10/19 1951

## 2019-02-10 NOTE — Discharge Instructions (Signed)
We saw the ER for nausea, cough, diarrhea.  All the lab results in the ED are normal.  Ensure you are hydrating yourself well. It is possible that you might be suffering with COVID-19 infection.  Your COVID-19 swab results might take 24 to 48 hours to result.  You will be contacted if it is positive.  Until then please read the guidelines and instructions on self isolation and quarantine.

## 2019-02-12 LAB — NOVEL CORONAVIRUS, NAA (HOSP ORDER, SEND-OUT TO REF LAB; TAT 18-24 HRS): SARS-CoV-2, NAA: DETECTED — AB

## 2019-02-13 ENCOUNTER — Other Ambulatory Visit: Payer: Self-pay | Admitting: Nurse Practitioner

## 2019-02-13 DIAGNOSIS — I10 Essential (primary) hypertension: Secondary | ICD-10-CM

## 2019-02-13 DIAGNOSIS — U071 COVID-19: Secondary | ICD-10-CM

## 2019-02-13 NOTE — Progress Notes (Signed)
I connected by phone with [name] on 02/13/2019 at 1:04 PM to discuss the potential use of an new treatment for mild to moderate COVID-19 viral infection in non-hospitalized patients.  This patient is a 78 y.o. male that meets the FDA criteria for Emergency Use Authorization of bamlanivimab or casirivimab\imdevimab.  Has a (+) direct SARS-CoV-2 viral test result  Has mild or moderate COVID-19   Is ? 78 years of age and weighs ? 40 kg  Is NOT hospitalized due to COVID-19  Is NOT requiring oxygen therapy or requiring an increase in baseline oxygen flow rate due to COVID-19  Is within 10 days of symptom onset  Has at least one of the high risk factor(s) for progression to severe COVID-19 and/or hospitalization as defined in EUA.  Specific high risk criteria : Hypertension   Patient is managed fr the following: Patient Active Problem List   Diagnosis Date Noted  . Abnormal peripheral vision, left 01/21/2018  . Anemia, chronic disease   . Left hemiparesis ([place])   . Acute ischemic cerebrovascular accident (CVA) involving right middle cerebral artery territory [facility])   . CKD (chronic kidney disease), stage III   . Peripheral edema   . Stroke (cerebrum) ([place]) 01/05/2018  . History of gout   . AKI (acute kidney injury) ([place])   . Stage 3 chronic kidney disease   . Anemia of chronic disease   . Left-sided weakness   . Osteoarthritis   . OSA (obstructive sleep apnea)   . Noncompliance with CPAP treatment   . History of DVT (deep vein thrombosis)   . History of CVA (cerebrovascular accident)   . Dysphagia, post-stroke   . Sinus tachycardia   . Acute blood loss anemia   . Acute CVA (cerebrovascular accident) ([place]) 01/01/2018  . Hypertensive urgency 01/01/2018  . CKD (chronic kidney disease) stage 2, GFR 60-89 ml/min 01/01/2018  . 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 Generalized weakness   . Sepsis ([place]) 12/10/2016  . Bacteremia due to Streptococcus  12/10/2016  . Lactic acidosis 12/10/2016  . Elevated troponin 12/10/2016  . Renal insufficiency 12/10/2016  . Hypophosphatemia 12/10/2016  . Left leg swelling 12/10/2016  . Febrile illness 12/09/2016  . Febrile illness, acute   . OBESITY, UNSPECIFIED 05/31/2009  . Hyperlipidemia 05/30/2009  . Obstructive sleep apnea 05/30/2009  . Essential hypertension 05/30/2009  . CAD (coronary artery disease) 05/30/2009       I have spoken and communicated the following to the patient or parent/caregiver:  1. FDA has authorized the emergency use of bamlanivimab and casirivimab\imdevimab for the treatment of mild to moderate COVID-19 in adults and pediatric patients with positive results of direct SARS-CoV-2 viral testing who are 42 years of age and older weighing at least 40 kg, and who are at high risk for progressing to severe COVID-19 and/or hospitalization.  2. The significant known and potential risks and benefits of bamlanivimab and casirivimab\imdevimab, and the extent to which such potential risks and benefits are unknown.  3. Information on available alternative treatments and the risks and benefits of those alternatives, including clinical trials.  4. Patients treated with bamlanivimab and casirivimab\imdevimab should continue to self-isolate and use infection control measures (e.g., wear mask, isolate, social distance, avoid sharing personal items, clean and disinfect "high touch" surfaces, and frequent handwashing) according to CDC guidelines.   5. The patient or parent/caregiver has the option to accept or refuse bamlanivimab or casirivimab\imdevimab .  After reviewing this information with the patient,  The patient agreed to proceed with receiving the bamlanimivab infusion and will be provided a copy of the Fact sheet prior to receiving the infusion.[name] 02/13/2019 1:04 PM

## 2019-02-14 ENCOUNTER — Ambulatory Visit (HOSPITAL_COMMUNITY)
Admission: RE | Admit: 2019-02-14 | Discharge: 2019-02-14 | Disposition: A | Discharge status: Home / Self Care | Payer: Medicare Other | Source: Ambulatory Visit | Attending: Pulmonary Disease | Admitting: Pulmonary Disease

## 2019-02-14 DIAGNOSIS — Z23 Encounter for immunization: Secondary | ICD-10-CM | POA: Insufficient documentation

## 2019-02-14 DIAGNOSIS — I10 Essential (primary) hypertension: Secondary | ICD-10-CM

## 2019-02-14 DIAGNOSIS — U071 COVID-19: Secondary | ICD-10-CM

## 2019-02-14 MED ORDER — SODIUM CHLORIDE 0.9 % IV SOLN
700.0000 mg | Freq: Once | INTRAVENOUS | Status: AC
  Administered 2019-02-14: 700 mg via INTRAVENOUS
  Filled 2019-02-14: qty 20

## 2019-02-14 MED ORDER — SODIUM CHLORIDE 0.9 % IV SOLN
INTRAVENOUS | Status: DC | PRN
  Administered 2019-02-14: 250 mL via INTRAVENOUS

## 2019-02-14 MED ORDER — ALBUTEROL SULFATE HFA 108 (90 BASE) MCG/ACT IN AERS: 2.0000 | INHALATION_SPRAY | Freq: Once | RESPIRATORY_TRACT | Status: DC | PRN

## 2019-02-14 MED ORDER — FAMOTIDINE IN NACL 20-0.9 MG/50ML-% IV SOLN: 20.0000 mg | Freq: Once | INTRAVENOUS | Status: DC | PRN

## 2019-02-14 MED ORDER — DIPHENHYDRAMINE HCL 50 MG/ML IJ SOLN: 50.0000 mg | Freq: Once | INTRAMUSCULAR | Status: DC | PRN

## 2019-02-14 MED ORDER — METHYLPREDNISOLONE SODIUM SUCC 125 MG IJ SOLR: 125.0000 mg | Freq: Once | INTRAMUSCULAR | Status: DC | PRN

## 2019-02-14 MED ORDER — EPINEPHRINE 0.3 MG/0.3ML IJ SOAJ: 0.3000 mg | Freq: Once | INTRAMUSCULAR | Status: DC | PRN

## 2019-02-14 NOTE — Progress Notes (Signed)
  Diagnosis: COVID-19  Physician: Dr. Patrick Wright  Procedure: [facility] Med: bamlanivimab infusion - Provided patient with bamlanimivab fact sheet for patients, parents and caregivers prior to infusion.  Complications: No immediate complications noted.  Discharge: Discharged home   Ally Yow 02/14/2019   

## 2019-03-08 ENCOUNTER — Ambulatory Visit (INDEPENDENT_AMBULATORY_CARE_PROVIDER_SITE_OTHER): Payer: Medicare Other | Admitting: *Deleted

## 2019-03-08 DIAGNOSIS — I639 Cerebral infarction, unspecified: Secondary | ICD-10-CM

## 2019-03-09 LAB — CUP PACEART REMOTE DEVICE CHECK
Date Time Interrogation Session: 20210106190440
Implantable Pulse Generator Implant Date: 20191104

## 2019-03-13 ENCOUNTER — Telehealth: Payer: Self-pay

## 2019-03-13 NOTE — Telephone Encounter (Signed)
Unable to speak  with patient to remind of missed remote transmission 

## 2019-03-27 ENCOUNTER — Telehealth: Payer: Self-pay

## 2019-03-27 NOTE — Telephone Encounter (Signed)
Called pt to see if he is having problems with the CPAP machine or if he is not utilizing it. Spoke with son for pt to call us back.

## 2019-03-28 ENCOUNTER — Ambulatory Visit: Payer: Medicare Other | Admitting: Family Medicine

## 2019-03-28 ENCOUNTER — Encounter: Payer: Self-pay | Admitting: Family Medicine

## 2019-03-28 ENCOUNTER — Other Ambulatory Visit: Payer: Self-pay

## 2019-03-28 VITALS — BP 150/96 | HR 89 | Temp 97.9°F | Ht 74.0 in | Wt 252.2 lb

## 2019-03-28 DIAGNOSIS — G4733 Obstructive sleep apnea (adult) (pediatric): Secondary | ICD-10-CM

## 2019-03-28 NOTE — Progress Notes (Addendum)
PATIENT: [name] DOB: 03-Jul-1940  REASON FOR VISIT: follow up HISTORY FROM: patient  Chief Complaint  Patient presents with  . Follow-up    rm8. alone. states that he has issues with the mask.      HISTORY OF PRESENT ILLNESS: Today 03/28/19 [name] is a 79 y.o. male here today for follow up for OSA recently started on BiPAP therapy. Sleep study revealed total AHI of 10.4/hr and REM AHI of 56.5/hr with O2 nadir of 82%. Titration study revealed apnea was best treated with BiPAP. He reports that he did ok with therapy in the beginning but was diagnosed with COVID about 2 months ago. He has not been able to get back in the habit of using BiPAP since. He also feels that his mask is uncomfortable. He has tried a memory foam full face mask, given to him by a friend, and feels it is much more comfortable. He has a crack in the mask and reports that it is leaking air.   Compliance report dated 01/01/2019 through 01/30/2019 reveals that he used CPAP 22 of those 30 days for compliance of 73%.  6 of those 30 days he used CPAP greater than 4 hours for compliance of 20%.  Average usage was 2 hours and 35 minutes.  Residual AHI was 5.9 with IPAP of 19 cm of water and EPAP of 15 cm of water.  There was a significant leak noted in the 95th percentile of 68.1.    HISTORY: (copied from Dr Guadelupe Sabin note on 04/27/2018)  Dear [name],   I saw your patient, [name], upon your kind request in my sleep clinic today for initial consultation of his sleep disorder, in particular, concern for underlying obstructive sleep apnea. The patient is accompanied by his wife today. As you know, Mr. 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 He has not been on CPAP therapy in years, but used CPAP in the past. He has an old CPAP machine at home. Prior sleep study results are not available for my review today. A CPAP download is not available either. I reviewed your office note from 03/15/2018. His Epworth sleepiness score is 13 out of 24 today, fatigue score is 10 out of 63. He lives with his wife, they have 3 children. He does not smoke or drink alcohol, he does not utilize caffeine on a regular basis. He is in outpt PT still, also OT.  His BT is around 10 PM, and rise time around 10 or 11 AM. He has nocturia about 3 times per night. He has been a long sleeper for as long as he can remember. 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 He has lost about 30 lb since November. His main complaint about CPAP in the past was his sleep interruption, having to go to the bathroom and he found CPAP disruptive and cumbersome to use, he used a fullface mask in the past. He would be willing to get retested and consider CPAP therapy again. His brother had sleep apnea. He had a tonsillectomy as a teenager. He tends to watch TV in the bedroom, TV can stay on all night at times. No pets in the household.  REVIEW OF SYSTEMS: Out of a complete 14 system review of symptoms, the patient complains only of the following symptoms, none and all other reviewed systems are negative.    ALLERGIES: Allergies  Allergen Reactions  . Ramipril Cough    HOME MEDICATIONS: Outpatient Medications Prior to Visit  Medication Sig Dispense Refill  . acetaminophen (TYLENOL) 325 MG tablet Take 1-2 tablets (325-650 mg total) by mouth every 4 (four) hours as needed for mild pain.    [name] allopurinol (ZYLOPRIM) 100 MG tablet Take 200 mg by mouth daily.     [name] amLODipine (NORVASC) 5 MG tablet Take 1 tablet by mouth once daily 90 tablet 2  . ELIQUIS 5 MG TABS tablet Take 1 tablet by mouth twice daily 180  tablet 1  . furosemide (LASIX) 20 MG tablet Take 1/2 (one-half) tablet by mouth once daily (Patient taking differently: Take 10 mg by mouth daily. ) 30 tablet 1  . metoprolol tartrate (LOPRESSOR) 25 MG tablet TAKE ONE TABLET BY MOUTH TWICE DAILY (Patient taking differently: Take 25 mg by mouth 2 (two) times daily. ) 30 tablet 0  . montelukast (SINGULAIR) 10 MG tablet Take 10 mg by mouth at bedtime.    . niacin (NIASPAN) 500 MG CR tablet Take 500 mg by mouth at bedtime.      . ondansetron (ZOFRAN ODT) 8 MG disintegrating tablet Take 1 tablet (8 mg total) by mouth every 8 (eight) hours as needed for nausea. 20 tablet 0  . pantoprazole (PROTONIX) 40 MG tablet Take 1 tablet (40 mg total) by mouth daily. 30 tablet 0  . polyethylene glycol (MIRALAX / GLYCOLAX) packet Take 17 g by mouth daily. (Patient taking differently: Take 17 g by mouth daily as needed for mild constipation. ) 60 each 0  . potassium chloride (KLOR-CON) 10 MEQ tablet Take 10 mEq by mouth daily.    . rosuvastatin (CRESTOR) 10 MG tablet Take 10 mg by mouth at bedtime.    . tamsulosin (FLOMAX) 0.4 MG CAPS capsule Take 1 capsule (0.4 mg total) by mouth daily. 30 capsule   . Menthol-Methyl Salicylate (MUSCLE RUB) 10-15 % CREA Apply 1 application topically 4 (four) times daily -  with meals and at bedtime. To neck and shoulders (Patient not taking: Reported on 02/10/2019)  0   No facility-administered medications prior to visit.    PAST MEDICAL HISTORY: Past Medical History:  Diagnosis Date  . CAD (coronary artery disease)    s/p cath in 2005 and CABG x4  . Cataract   . DJD (degenerative joint disease)   . DJD (degenerative joint disease)   . Fluid retention    mild  . H/O: gout   . History of blood clots   . HTN (hypertension)   . Hyperlipidemia   . Hyperlipidemia   . Myocardial infarction ([place]) 2009  . Obesity    with reduction  . OSA (obstructive sleep apnea)    AHI 98/hr  . Renal insufficiency   . Sleep apnea   .  Stroke [facility]) 2000    PAST SURGICAL HISTORY: Past Surgical History:  Procedure Laterality Date  . CORONARY ARTERY BYPASS GRAFT  2005   LIMA to LAD, SVG to OM1 and OM 2, RCA.   [name] left inguinal hernia repair    . LOOP RECORDER INSERTION N/A 01/03/2018   Procedure: LOOP RECORDER INSERTION;  Surgeon: Evans Lance, MD;  Location: [facility];  Service: Cardiovascular;  Laterality: N/A;  . TEE WITHOUT CARDIOVERSION N/A  12/11/2016   Procedure: TRANSESOPHAGEAL ECHOCARDIOGRAM (TEE);  Surgeon: [name], MD;  Location: [facility] ENDOSCOPY;  Service: Cardiovascular;  Laterality: N/A;  . TEE WITHOUT CARDIOVERSION N/A 01/03/2018   Procedure: TRANSESOPHAGEAL ECHOCARDIOGRAM (TEE);  Surgeon: Dorothy Spark, MD;  Location: [facility] ENDOSCOPY;  Service: Cardiovascular;  Laterality: N/A;    FAMILY HISTORY: Family History  Problem Relation Age of Onset  . Gout Mother   . Stroke Mother   . Coronary artery disease Mother   . Hypertension Mother   . Arthritis Mother   . Coronary artery disease Father   . Arthritis Father   . Gout Father   . Stroke Father   . Hypertension Father   . Coronary artery disease Brother   . Arthritis Brother   . Gout Brother   . Stroke Brother   . Hypertension Brother   . Coronary artery disease Brother   . Arthritis Brother   . Gout Brother   . Stroke Brother   . Hypertension Brother   . Coronary artery disease Other        significant in multiple family members    SOCIAL HISTORY: Social History   Socioeconomic History  . Marital status: Married    Spouse name: [name]  . Number of children: Not on file  . Years of education: Not on file  . Highest education level: Not on file  Occupational History  . Not on file  Tobacco Use  . Smoking status: Former Smoker    Types: Cigars  . Smokeless tobacco: Never Used  . Tobacco comment: used to smoke cigars, 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, none since  Substance and Sexual Activity  . Alcohol use: No  . Drug  use: No  . Sexual activity: Not on file  Other Topics Concern  . Not on file  Social History Narrative  . Not on file   Social Determinants of Health   Financial Resource Strain:   . Difficulty of Paying Living Expenses: Not on file  Food Insecurity:   . Worried About Charity fundraiser in the Last Year: Not on file  . Ran Out of Food in the Last Year: Not on file  Transportation Needs:   . Lack of Transportation (Medical): Not on file  . Lack of Transportation (Non-Medical): Not on file  Physical Activity:   . Days of Exercise per Week: Not on file  . Minutes of Exercise per Session: Not on file  Stress:   . Feeling of Stress : Not on file  Social Connections:   . Frequency of Communication with Friends and Family: Not on file  . Frequency of Social Gatherings with Friends and Family: Not on file  . Attends Religious Services: Not on file  . Active Member of Clubs or Organizations: Not on file  . Attends Archivist Meetings: Not on file  . Marital Status: Not on file  Intimate Partner Violence:   . Fear of Current or Ex-Partner: Not on file  . Emotionally Abused: Not on file  . Physically Abused: Not on file  . Sexually Abused: Not on file      PHYSICAL EXAM  Vitals:   03/28/19 1323  BP: (!) 150/96  Pulse: 89  Temp: 97.9 F (36.6 C)  Weight: 252 lb 3.2 oz (114.4 kg)  Height: 6\' 2"  (1.88 m)   Body mass index is 32.38 kg/m.  Generalized: Well developed, in no acute distress  Cardiology: normal rate and rhythm, no murmur  noted Respiratory: clear to auscultation bilaterally  Neurological examination  Mentation: Alert oriented to time, place, history taking. Follows all commands speech and language fluent Cranial nerve II-XII: Pupils were equal round reactive to light. Extraocular movements were full, visual field were full  Motor: The motor testing reveals 5 over 5 strength of all 4 extremities. Good symmetric motor tone is noted throughout.  Gait  and station: Gait is stable with single prong cane   DIAGNOSTIC DATA (LABS, IMAGING, TESTING) - I reviewed patient records, labs, notes, testing and imaging myself where available.  No flowsheet data found.   Lab Results  Component Value Date   WBC 5.4 02/10/2019   HGB 13.7 02/10/2019   HCT 43.1 02/10/2019   MCV 88.3 02/10/2019   PLT 193 02/10/2019      Component Value Date/Time   NA 140 02/10/2019 1229   NA 143 09/07/2017 1617   K 3.9 02/10/2019 1229   CL 105 02/10/2019 1229   CO2 25 02/10/2019 1229   GLUCOSE 116 (H) 02/10/2019 1229   BUN 23 02/10/2019 1229   BUN 21 09/07/2017 1617   CREATININE 1.57 (H) 02/10/2019 1229   CALCIUM 9.9 02/10/2019 1229   PROT 7.6 02/10/2019 1229   PROT 7.5 09/07/2017 1617   ALBUMIN 3.4 (L) 02/10/2019 1229   ALBUMIN 4.1 09/07/2017 1617   AST 27 02/10/2019 1229   ALT 19 02/10/2019 1229   ALKPHOS 46 02/10/2019 1229   BILITOT 0.4 02/10/2019 1229   BILITOT 0.5 09/07/2017 1617   GFRNONAA 42 (L) 02/10/2019 1229   GFRAA 49 (L) 02/10/2019 1229   Lab Results  Component Value Date   CHOL 127 01/01/2018   HDL 39 (L) 01/01/2018   LDLCALC 73 01/01/2018   TRIG 73 01/01/2018   CHOLHDL 3.3 01/01/2018   Lab Results  Component Value Date   HGBA1C 5.8 (H) 01/01/2018   No results found for: [id number] Lab Results  Component Value Date   TSH 1.550 09/07/2017    ASSESSMENT AND PLAN 79 y.o. year old male  has a past medical history of CAD (coronary artery disease), Cataract, DJD (degenerative joint disease), DJD (degenerative joint disease), Fluid retention, H/O: gout, History of blood clots, HTN (hypertension), Hyperlipidemia, Hyperlipidemia, Myocardial infarction ([place]) (2009), Obesity, OSA (obstructive sleep apnea), Renal insufficiency, Sleep apnea, and Stroke ([place]) (2000). here with     ICD-10-CM   1. OSA treated with BiPAP  G47.33 For home use only DME Bipap    Mr Gupta admits that he has had a difficult time resuming BiPAP therapy following  COVID-19 infection about 2 months ago.  Compliance report consistent with his story and last dated in November/2020.  He is motivated to continue using BiPAP therapy.  He is requesting a mask refitting.  We will order that for him today.  He was encouraged to continue using BiPAP therapy nightly and greater than 4 hours each night.  We have discussed risk of untreated sleep apnea.  He continues close follow-up with primary care and Frann Rider for stroke prevention.  He will follow-up with me in 8 weeks, sooner if needed.  He verbalizes understanding and agreement with this plan.   Orders Placed This Encounter  Procedures  . For home use only DME Bipap    Mask refitting, would like memory foam mask if possible    Order Specific Question:   Length of Need    Answer:   Lifetime    Order Specific Question:   Inspiratory pressure  Answer:   OTHER SEE COMMENTS    Order Specific Question:   Expiratory pressure    Answer:   OTHER SEE COMMENTS     No orders of the defined types were placed in this encounter.     I spent 15 minutes with the patient. 50% of this time was spent counseling and educating patient on plan of care and medications.    Debbora Presto, FNP-C 03/28/2019, 4:20 PM [name] [address], [place], [place] [address] [phone]  I reviewed the above note and documentation by the Nurse Practitioner and agree with the history, exam, assessment and plan as outlined above. I was available for consultation. Star Age, MD, PhD [name])

## 2019-03-28 NOTE — Patient Instructions (Signed)
We will send an order today for a mask refitting   Continue working toward nightly use and greater than 4 hours each night  Follow up with me in 8 weeks   Sleep Apnea Sleep apnea affects breathing during sleep. It causes breathing to stop for a short time or to become shallow. It can also increase the risk of:  Heart attack.  Stroke.  Being very overweight (obese).  Diabetes.  Heart failure.  Irregular heartbeat. The goal of treatment is to help you breathe normally again. What are the causes? There are three kinds of sleep apnea:  Obstructive sleep apnea. This is caused by a blocked or collapsed airway.  Central sleep apnea. This happens when the brain does not send the right signals to the muscles that control breathing.  Mixed sleep apnea. This is a combination of obstructive and central sleep apnea. The most common cause of this condition is a collapsed or blocked airway. This can happen if:  Your throat muscles are too relaxed.  Your tongue and tonsils are too large.  You are overweight.  Your airway is too small. What increases the risk?  Being overweight.  Smoking.  Having a small airway.  Being older.  Being male.  Drinking alcohol.  Taking medicines to calm yourself (sedatives or tranquilizers).  Having family members with the condition. What are the signs or symptoms?  Trouble staying asleep.  Being sleepy or tired during the day.  Getting angry a lot.  Loud snoring.  Headaches in the morning.  Not being able to focus your mind (concentrate).  Forgetting things.  Less interest in sex.  Mood swings.  Personality changes.  Feelings of sadness (depression).  Waking up a lot during the night to pee (urinate).  Dry mouth.  Sore throat. How is this diagnosed?  Your medical history.  A physical exam.  A test that is done when you are sleeping (sleep study). The test is most often done in a sleep lab but may also be done at  home. How is this treated?   Sleeping on your side.  Using a medicine to get rid of mucus in your nose (decongestant).  Avoiding the use of alcohol, medicines to help you relax, or certain pain medicines (narcotics).  Losing weight, if needed.  Changing your diet.  Not smoking.  Using a machine to open your airway while you sleep, such as: ? An oral appliance. This is a mouthpiece that shifts your lower jaw forward. ? A CPAP device. This device blows air through a mask when you breathe out (exhale). ? An EPAP device. This has valves that you put in each nostril. ? A BPAP device. This device blows air through a mask when you breathe in (inhale) and breathe out.  Having surgery if other treatments do not work. It is important to get treatment for sleep apnea. Without treatment, it can lead to:  High blood pressure.  Coronary artery disease.  In men, not being able to have an erection (impotence).  Reduced thinking ability. Follow these instructions at home: Lifestyle  Make changes that your doctor recommends.  Eat a healthy diet.  Lose weight if needed.  Avoid alcohol, medicines to help you relax, and some pain medicines.  Do not use any products that contain nicotine or tobacco, such as cigarettes, e-cigarettes, and chewing tobacco. If you need help quitting, ask your doctor. General instructions  Take over-the-counter and prescription medicines only as told by your doctor.  If you  were given a machine to use while you sleep, use it only as told by your doctor.  If you are having surgery, make sure to tell your doctor you have sleep apnea. You may need to bring your device with you.  Keep all follow-up visits as told by your doctor. This is important. Contact a doctor if:  The machine that you were given to use during sleep bothers you or does not seem to be working.  You do not get better.  You get worse. Get help right away if:  Your chest hurts.  You  have trouble breathing in enough air.  You have an uncomfortable feeling in your back, arms, or stomach.  You have trouble talking.  One side of your body feels weak.  A part of your face is hanging down. These symptoms may be an emergency. Do not wait to see if the symptoms will go away. Get medical help right away. Call your local emergency services (911 in the [place] Do not drive yourself to the hospital. Summary  This condition affects breathing during sleep.  The most common cause is a collapsed or blocked airway.  The goal of treatment is to help you breathe normally while you sleep. This information is not intended to replace advice given to you by your health care provider. Make sure you discuss any questions you have with your health care provider. Document Revised: 12/03/2017 Document Reviewed: 10/12/2017 Elsevier Patient Education  [employer].

## 2019-04-10 ENCOUNTER — Ambulatory Visit (INDEPENDENT_AMBULATORY_CARE_PROVIDER_SITE_OTHER): Payer: Medicare Other | Admitting: *Deleted

## 2019-04-10 DIAGNOSIS — I639 Cerebral infarction, unspecified: Secondary | ICD-10-CM

## 2019-04-10 LAB — CUP PACEART REMOTE DEVICE CHECK
Date Time Interrogation Session: 20210207234503
Implantable Pulse Generator Implant Date: 20191104

## 2019-04-11 NOTE — Progress Notes (Signed)
ILR Remote 

## 2019-04-12 ENCOUNTER — Telehealth: Payer: Self-pay

## 2019-04-12 NOTE — Telephone Encounter (Signed)
The pt wife called because she received an error code and the monitor became hot. I gave her the number to [employer] tech support. I also gave her my direct office number incase she has any further questions about the pt monitor.

## 2019-04-18 ENCOUNTER — Other Ambulatory Visit: Payer: Self-pay | Admitting: Internal Medicine

## 2019-05-05 ENCOUNTER — Other Ambulatory Visit: Payer: Self-pay

## 2019-05-05 ENCOUNTER — Encounter: Payer: Self-pay | Admitting: Podiatry

## 2019-05-05 ENCOUNTER — Ambulatory Visit: Payer: Medicare Other | Admitting: Podiatry

## 2019-05-05 VITALS — Temp 96.5°F

## 2019-05-05 DIAGNOSIS — L84 Corns and callosities: Secondary | ICD-10-CM

## 2019-05-05 DIAGNOSIS — B351 Tinea unguium: Secondary | ICD-10-CM

## 2019-05-05 DIAGNOSIS — M79675 Pain in left toe(s): Secondary | ICD-10-CM | POA: Diagnosis not present

## 2019-05-05 DIAGNOSIS — I739 Peripheral vascular disease, unspecified: Secondary | ICD-10-CM | POA: Diagnosis not present

## 2019-05-05 DIAGNOSIS — M79674 Pain in right toe(s): Secondary | ICD-10-CM | POA: Diagnosis not present

## 2019-05-05 NOTE — Patient Instructions (Addendum)
WEEKLY FOOT SOAK INSTRUCTIONS FOR FOOT HYGIENE   1. SOAK FEET IN LUKEWARM SOAPY WATER FOR 10 MINUTES. HAVE A FAMILY MEMBER OR CAREGIVER CHECK THE WATER TEMPERATURE FOR YOU BEFORE SUBMERGING YOUR FEET IN THE WATER.  2.  DRY FEET WELL TAKING CARE TO DRY WELL BETWEEN TOES AND UNDER TOES.  3.  APPLY MOISTURIZING CREAM TO FEET AVOIDING APPLICATION BETWEEN TOES   Moisturize feet once daily; do not apply between toes: Vaseline Intensive Care Lotion Lubriderm Lotion Gold Bond Diabetic Foot Lotion Eucerin Intensive Repair Moisturizing Lotion  If you have problems reaching your feet: Aquaphor Advanced Therapy Ointment Body Spray Vaseline Intensive Care Spray Lotion Advanced Repair   Corns and Calluses Corns are small areas of thickened skin that occur on the top, sides, or tip of a toe. They contain a cone-shaped core with a point that can press on a nerve below. This causes pain.  Calluses are areas of thickened skin that can occur anywhere on the body, including the hands, fingers, palms, soles of the feet, and heels. Calluses are usually larger than corns. What are the causes? Corns and calluses are caused by rubbing (friction) or pressure, such as from shoes that are too tight or do not fit properly. What increases the risk? Corns are more likely to develop in people who have misshapen toes (toe deformities), such as hammer toes. Calluses can occur with friction to any area of the skin. They are more likely to develop in people who:  Work with their hands.  Wear shoes that fit poorly, are too tight, or are high-heeled.  Have toe deformities. What are the signs or symptoms? Symptoms of a corn or callus include:  A hard growth on the skin.  Pain or tenderness under the skin.  Redness and swelling.  Increased discomfort while wearing tight-fitting shoes, if your feet are affected. If a corn or callus becomes infected, symptoms may include:  Redness and swelling that gets worse.   Pain.  Fluid, blood, or pus draining from the corn or callus. How is this diagnosed? Corns and calluses may be diagnosed based on your symptoms, your medical history, and a physical exam. How is this treated? Treatment for corns and calluses may include:  Removing the cause of the friction or pressure. This may involve: ? Changing your shoes. ? Wearing shoe inserts (orthotics) or other protective layers in your shoes, such as a corn pad. ? Wearing gloves.  Applying medicine to the skin (topical medicine) to help soften skin in the hardened, thickened areas.  Removing layers of dead skin with a file to reduce the size of the corn or callus.  Removing the corn or callus with a scalpel or laser.  Taking antibiotic medicines, if your corn or callus is infected.  Having surgery, if a toe deformity is the cause. Follow these instructions at home:   Take over-the-counter and prescription medicines only as told by your health care provider.  If you were prescribed an antibiotic, take it as told by your health care provider. Do not stop taking it even if your condition starts to improve.  Wear shoes that fit well. Avoid wearing high-heeled shoes and shoes that are too tight or too loose.  Wear any padding, protective layers, gloves, or orthotics as told by your health care provider.  Soak your hands or feet and then use a file or pumice stone to soften your corn or callus. Do this as told by your health care provider.  Check your  corn or callus every day for symptoms of infection. Contact a health care provider if you:  Notice that your symptoms do not improve with treatment.  Have redness or swelling that gets worse.  Notice that your corn or callus becomes painful.  Have fluid, blood, or pus coming from your corn or callus.  Have new symptoms. Summary  Corns are small areas of thickened skin that occur on the top, sides, or tip of a toe.  Calluses are areas of thickened  skin that can occur anywhere on the body, including the hands, fingers, palms, and soles of the feet. Calluses are usually larger than corns.  Corns and calluses are caused by rubbing (friction) or pressure, such as from shoes that are too tight or do not fit properly.  Treatment may include wearing any padding, protective layers, gloves, or orthotics as told by your health care provider. This information is not intended to replace advice given to you by your health care provider. Make sure you discuss any questions you have with your health care provider. Document Revised: 06/08/2018 Document Reviewed: 12/30/2016 Elsevier Patient Education  2020 [place].   Edema  Edema is an abnormal buildup of fluids in the body tissues and under the skin. Swelling of the legs, feet, and ankles is a common symptom that becomes more likely as you get older. Swelling is also common in looser tissues, like around the eyes. When the affected area is squeezed, the fluid may move out of that spot and leave a dent for a few moments. This dent is called pitting edema. There are many possible causes of edema. Eating too much salt (sodium) and being on your feet or sitting for a long time can cause edema in your legs, feet, and ankles. Hot weather may make edema worse. Common causes of edema include:  Heart failure.  Liver or kidney disease.  Weak leg blood vessels.  Cancer.  An injury.  Pregnancy.  Medicines.  Being obese.  Low protein levels in the blood. Edema is usually painless. Your skin may look swollen or shiny. Follow these instructions at home:  Keep the affected body part raised (elevated) above the level of your heart when you are sitting or lying down.  Do not sit still or stand for long periods of time.  Do not wear tight clothing. Do not wear garters on your upper legs.  Exercise your legs to get your circulation going. This helps to move the fluid back into your blood vessels,  and it may help the swelling go down.  Wear elastic bandages or support stockings to reduce swelling as told by your health care provider.  Eat a low-salt (low-sodium) diet to reduce fluid as told by your health care provider.  Depending on the cause of your swelling, you may need to limit how much fluid you drink (fluid restriction).  Take over-the-counter and prescription medicines only as told by your health care provider. Contact a health care provider if:  Your edema does not get better with treatment.  You have heart, liver, or kidney disease and have symptoms of edema.  You have sudden and unexplained weight gain. Get help right away if:  You develop shortness of breath or chest pain.  You cannot breathe when you lie down.  You develop pain, redness, or warmth in the swollen areas.  You have heart, liver, or kidney disease and suddenly get edema.  You have a fever and your symptoms suddenly get worse. Summary  Edema is an abnormal buildup of fluids in the body tissues and under the skin.  Eating too much salt (sodium) and being on your feet or sitting for a long time can cause edema in your legs, feet, and ankles.  Keep the affected body part raised (elevated) above the level of your heart when you are sitting or lying down. This information is not intended to replace advice given to you by your health care provider. Make sure you discuss any questions you have with your health care provider. Document Revised: 07/06/2018 Document Reviewed: 03/21/2016 Elsevier Patient Education  [place].

## 2019-05-11 ENCOUNTER — Telehealth: Payer: Self-pay

## 2019-05-11 ENCOUNTER — Ambulatory Visit (INDEPENDENT_AMBULATORY_CARE_PROVIDER_SITE_OTHER): Payer: Medicare Other | Admitting: *Deleted

## 2019-05-11 DIAGNOSIS — I639 Cerebral infarction, unspecified: Secondary | ICD-10-CM

## 2019-05-11 LAB — CUP PACEART REMOTE DEVICE CHECK
Date Time Interrogation Session: 20210311001642
Date Time Interrogation Session: 20210311112509
Implantable Pulse Generator Implant Date: 20191104
Implantable Pulse Generator Implant Date: 20191104

## 2019-05-11 NOTE — Telephone Encounter (Signed)
Manual transmission received.  New AF episodes appear false- SR with PVC and PAC.  Tachy episodes consistent with history.  Report already exported to MD.

## 2019-05-11 NOTE — Progress Notes (Signed)
Subjective: [name] presents today for follow up of painful mycotic nails b/l that are difficult to trim. Pain interferes with ambulation. Aggravating factors include wearing enclosed shoe gear. Pain is relieved with periodic professional debridement.   Wife is present during the visit. She states [name] has had another stroke since his last visit.  Allergies  Allergen Reactions  . Ramipril Cough     Objective: Vitals:   05/05/19 1629  Temp: (!) 96.5 F (35.8 C)    Pt 79 y.o. year old male  in NAD. AAO x 3.   Vascular Examination:  Capillary refill time to digits immediate b/l. Palpable DP pulses b/l. Nonpalpable PT pulses b/l. Pedal hair present b/l. Skin temperature gradient within normal limits b/l. Trace edema left LE.  Dermatological Examination: Pedal skin with normal turgor, texture and tone bilaterally. No open wounds bilaterally. No interdigital macerations bilaterally. Toenails 1-5 b/l elongated, dystrophic, thickened, crumbly with subungual debris and tenderness to dorsal palpation. Hyperkeratotic lesion(s) b/l hallux and distal tip right 3rd digit.  No erythema, no edema, no drainage, no flocculence.  He has poor pedal hygiene with interdigital malodorous debris.   Musculoskeletal: Normal muscle strength 5/5 to all lower extremity muscle groups bilaterally, no pain crepitus or joint limitation noted with ROM b/l, bunion deformity noted b/l, hammertoes noted to the  R 2nd toe and interdigital corn medial aspect right 2nd digit and lateral aspect right great toe  Neurological: Protective sensation intact 5/5 intact bilaterally with 10g monofilament b/l Vibratory sensation intact b/l  Assessment: 1. Pain due to onychomycosis of toenails of both feet   2. Corns and callosities   3. PAD (peripheral artery disease) (HCC)    Plan: -Toenails 1-5 b/l were debrided in length and girth with sterile nail nippers and dremel without iatrogenic bleeding.  -Corn(s)  debrided right great toe and right 2nd digit without complication or incident. Total number debrided=2. Dispensed toe separators for right great toe and right 2nd toe.  -For poor pedal hygiene, prescribed instructions for once weekly hygiene soaks. -Calluses b/l hallux were debrided without complication or incident. Total number debrided =2. -Patient to continue soft, supportive shoe gear daily. -Patient to report any pedal injuries to medical professional immediately. -Patient/POA to call should there be question/concern in the interim.  Return in about 3 months (around 08/05/2019) for nail trim/ Eliquis.

## 2019-05-11 NOTE — Telephone Encounter (Signed)
[facility] alert received with noted 5 new AF episodes and 8 tachy episodes,  EGMs not avail.  Need manual download for details.  Pt with known AF on OAC-Eliquis and Metoprolol 25mg  BID.   Attempted to reach pt for manual download and to assess meds/ symptoms.  No answer, VM in home phone is full and on cellphone has not been set up, unable to leave a message.

## 2019-05-11 NOTE — Progress Notes (Signed)
ILR Remote 

## 2019-05-25 ENCOUNTER — Other Ambulatory Visit: Payer: Self-pay

## 2019-05-25 ENCOUNTER — Encounter: Payer: Self-pay | Admitting: Family Medicine

## 2019-05-25 ENCOUNTER — Ambulatory Visit: Payer: Medicare Other | Admitting: Family Medicine

## 2019-05-25 VITALS — BP 128/82 | HR 65 | Temp 97.6°F | Ht 74.0 in | Wt 249.2 lb

## 2019-05-25 DIAGNOSIS — G4733 Obstructive sleep apnea (adult) (pediatric): Secondary | ICD-10-CM

## 2019-05-25 NOTE — Progress Notes (Addendum)
PATIENT: [name] DOB: 1940-05-15  REASON FOR VISIT: follow up HISTORY FROM: patient  Chief Complaint  Patient presents with  . Follow-up    Rm2 alone. states that he is still using the CPAP machine. However, the patient stipulates the machines pressure is too high, it leaks and he has bruising on his nose.     HISTORY OF PRESENT ILLNESS: Today 05/25/19 [name] is a 79 y.o. male here today for follow up for OSA on BiPAP therapy.  He reports that he is doing much better on BiPAP since getting his new mask.  He is now using a nasal mask and feels that it is much more comfortable.  Unfortunately, his mask just arrived this week.  He states that there was a period of time last month where he was unable to use a mask due to bruising of his nose.  His compliance report indicates that he was having difficulty and not using BiPAP therapy from March 2 through March 15.  Starting March 16 he has been consistent with use.  Leak seems to have improved significantly.  Residual AHI has been normal.  Compliance report dated 04/24/2019 through 05/23/2019 reveals that he used BiPAP therapy getting the past 30 days for compliance of 50%.  He used BiPAP greater than 4 hours 12 of the last 30 days for compliance of 40%.  Average usage was 5 hours and 13 minutes.  Residual AHI was 1.6 with IPAP of 19 cm of water and EPAP of 15 cm of water.  Leak in the 95th percentile of 17.9.  HISTORY: (copied from my note on 03/28/2019)  [name] is a 79 y.o. male here today for follow up for OSA recently started on BiPAP therapy. Sleep study revealed total AHI of 10.4/hr and REM AHI of 56.5/hr with O2 nadir of 82%. Titration study revealed apnea was best treated with BiPAP. He reports that he did ok with therapy in the beginning but was diagnosed with COVID about 2 months ago. He has not been able to get back in the habit of using BiPAP since. He also feels that his mask is uncomfortable. He has tried a memory  foam full face mask, given to him by a friend, and feels it is much more comfortable. He has a crack in the mask and reports that it is leaking air.   Compliance report dated 01/01/2019 through 01/30/2019 reveals that he used CPAP 22 of those 30 days for compliance of 73%.  6 of those 30 days he used CPAP greater than 4 hours for compliance of 20%.  Average usage was 2 hours and 35 minutes.  Residual AHI was 5.9 with IPAP of 19 cm of water and EPAP of 15 cm of water.  There was a significant leak noted in the 95th percentile of 68.1.    HISTORY: (copied from Dr Guadelupe Sabin note on 04/27/2018)  Dear [name],   I saw your patient, [name], upon your kind request in my sleep clinic today for initial consultation of his sleep disorder, in particular, concern for underlying obstructive sleep apnea. The patient is accompanied by his wife today. As you know, 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 He has not been on CPAP therapy in years, but used CPAP in the past. He has an old CPAP machine at home. Prior sleep study results are not available for my review today. A CPAP download is not available either. I reviewed your office note from 03/15/2018. His Epworth sleepiness score is 13 out of 24 today, fatigue score is 10 out of 63. He lives with his wife, they have 3 children. He does not smoke or drink alcohol, he does not utilize caffeine on a regular basis. He is in outpt PT still, also OT.  His BT is around 10 PM, and rise time around 10 or 11 AM. He has nocturia about 3 times per night. He has been a long sleeper for as long as he can remember. 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 He has lost about 30 lb since November. His main complaint about CPAP in the past was his sleep interruption, having to go to the bathroom and he found CPAP disruptive and cumbersome to use, he used a fullface mask in the past. He would be willing to get retested and consider CPAP therapy again. His brother had sleep apnea. He had a tonsillectomy as a teenager. He tends to watch TV in the bedroom, TV can stay on all night at times. No pets in the household.    REVIEW OF SYSTEMS: Out of a complete 14 system review of symptoms, the patient complains only of the following symptoms, none and all other reviewed systems are negative.   ALLERGIES: Allergies  Allergen Reactions  . Ramipril Cough    HOME MEDICATIONS: Outpatient Medications Prior to Visit  Medication Sig Dispense Refill  . acetaminophen (TYLENOL) 325 MG tablet Take 1-2 tablets (325-650 mg total) by mouth every 4 (four) hours as needed for mild pain.    [name] allopurinol (ZYLOPRIM) 100 MG tablet Take 200 mg by mouth daily.     [name] amLODipine (NORVASC) 5 MG tablet Take 1 tablet by mouth once daily 90 tablet 2  . ELIQUIS 5 MG TABS tablet Take 1 tablet by mouth twice daily 180 tablet 1  . furosemide (LASIX) 20 MG tablet Take 1/2 (one-half) tablet by mouth once daily 30 tablet 0  . Menthol-Methyl Salicylate (MUSCLE RUB) 10-15 % CREA Apply 1 application topically 4 (four) times daily -  with meals and at bedtime. To neck and shoulders  0  . metoprolol tartrate (LOPRESSOR) 25 MG tablet TAKE ONE TABLET BY MOUTH TWICE DAILY (Patient taking differently: Take 25 mg by mouth 2 (two) times daily. ) 30 tablet 0  . montelukast (SINGULAIR) 10 MG tablet Take 10 mg by mouth at bedtime.    . niacin (NIASPAN) 500 MG CR tablet Take 500 mg by mouth at bedtime.      . ondansetron (ZOFRAN ODT) 8 MG disintegrating tablet Take 1 tablet (8 mg total) by mouth every 8 (eight) hours as needed for nausea. 20 tablet 0  . pantoprazole  (PROTONIX) 40 MG tablet Take 1 tablet (40 mg total) by mouth daily. 30 tablet 0  . polyethylene glycol (MIRALAX / GLYCOLAX) packet Take 17 g by mouth daily. (Patient taking differently: Take 17 g by mouth daily as needed for mild constipation. ) 60 each 0  . potassium chloride (KLOR-CON) 10 MEQ tablet Take 10 mEq by mouth daily.    . rosuvastatin (CRESTOR) 10 MG tablet Take 10 mg by mouth at bedtime.    . tamsulosin (FLOMAX) 0.4 MG CAPS capsule  Take 1 capsule (0.4 mg total) by mouth daily. 30 capsule    No facility-administered medications prior to visit.    PAST MEDICAL HISTORY: Past Medical History:  Diagnosis Date  . CAD (coronary artery disease)    s/p cath in 2005 and CABG x4  . Cataract   . DJD (degenerative joint disease)   . DJD (degenerative joint disease)   . Fluid retention    mild  . H/O: gout   . History of blood clots   . HTN (hypertension)   . Hyperlipidemia   . Hyperlipidemia   . Myocardial infarction ([place]) 2009  . Obesity    with reduction  . OSA (obstructive sleep apnea)    AHI 98/hr  . Renal insufficiency   . Sleep apnea   . Stroke [facility]) 2000    PAST SURGICAL HISTORY: Past Surgical History:  Procedure Laterality Date  . CORONARY ARTERY BYPASS GRAFT  2005   LIMA to LAD, SVG to OM1 and OM 2, RCA.   [name] left inguinal hernia repair    . LOOP RECORDER INSERTION N/A 01/03/2018   Procedure: LOOP RECORDER INSERTION;  Surgeon: Evans Lance, MD;  Location: [facility] CV LAB;  Service: Cardiovascular;  Laterality: N/A;  . TEE WITHOUT CARDIOVERSION N/A 12/11/2016   Procedure: TRANSESOPHAGEAL ECHOCARDIOGRAM (TEE);  Surgeon: [name], MD;  Location: [facility] ENDOSCOPY;  Service: Cardiovascular;  Laterality: N/A;  . TEE WITHOUT CARDIOVERSION N/A 01/03/2018   Procedure: TRANSESOPHAGEAL ECHOCARDIOGRAM (TEE);  Surgeon: Dorothy Spark, MD;  Location: [facility] ENDOSCOPY;  Service: Cardiovascular;  Laterality: N/A;    FAMILY HISTORY: Family History  Problem Relation  Age of Onset  . Gout Mother   . Stroke Mother   . Coronary artery disease Mother   . Hypertension Mother   . Arthritis Mother   . Coronary artery disease Father   . Arthritis Father   . Gout Father   . Stroke Father   . Hypertension Father   . Coronary artery disease Brother   . Arthritis Brother   . Gout Brother   . Stroke Brother   . Hypertension Brother   . Coronary artery disease Brother   . Arthritis Brother   . Gout Brother   . Stroke Brother   . Hypertension Brother   . Coronary artery disease Other        significant in multiple family members    SOCIAL HISTORY: Social History   Socioeconomic History  . Marital status: Married    Spouse name: [name]  . Number of children: Not on file  . Years of education: Not on file  . Highest education level: Not on file  Occupational History  . Not on file  Tobacco Use  . Smoking status: Former Smoker    Types: Cigars  . Smokeless tobacco: Never Used  . Tobacco comment: used to smoke cigars, 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, none since  Substance and Sexual Activity  . Alcohol use: No  . Drug use: No  . Sexual activity: Not on file  Other Topics Concern  . Not on file  Social History Narrative  . Not on file   Social Determinants of Health   Financial Resource Strain:   . Difficulty of Paying Living Expenses:   Food Insecurity:   . Worried About Charity fundraiser in the Last Year:   . Arboriculturist in the Last Year:   Transportation Needs:   . Film/video editor (Medical):   [name]  Lack of Transportation (Non-Medical):   Physical Activity:   . Days of Exercise per Week:   . Minutes of Exercise per Session:   Stress:   . Feeling of Stress :   Social Connections:   . Frequency of Communication with Friends and Family:   . Frequency of Social Gatherings with Friends and Family:   . Attends Religious Services:   . Active Member of Clubs or Organizations:   . Attends Archivist Meetings:   [name]  Marital Status:   Intimate Partner Violence:   . Fear of Current or Ex-Partner:   . Emotionally Abused:   [name] Physically Abused:   . Sexually Abused:       PHYSICAL EXAM  Vitals:   05/25/19 1353  BP: 128/82  Pulse: 65  Temp: 97.6 F (36.4 C)  Weight: 249 lb 3.2 oz (113 kg)  Height: 6\' 2"  (1.88 m)   Body mass index is 32 kg/m.  Generalized: Well developed, in no acute distress  Cardiology: normal rate and rhythm, no murmur noted Respiratory: Clear to auscultation bilaterally Neurological examination  Mentation: Alert oriented to time, place, history taking. Follows all commands speech and language fluent Cranial nerve II-XII: Pupils were equal round reactive to light. Extraocular movements were full, visual field were full . Motor: The motor testing reveals 5 over 5 strength of all 4 extremities. Good symmetric motor tone is noted throughout.  Gait and station: Gait is narrow, spastic gait  DIAGNOSTIC DATA (LABS, IMAGING, TESTING) - I reviewed patient records, labs, notes, testing and imaging myself where available.  No flowsheet data found.   Lab Results  Component Value Date   WBC 5.4 02/10/2019   HGB 13.7 02/10/2019   HCT 43.1 02/10/2019   MCV 88.3 02/10/2019   PLT 193 02/10/2019      Component Value Date/Time   NA 140 02/10/2019 1229   NA 143 09/07/2017 1617   K 3.9 02/10/2019 1229   CL 105 02/10/2019 1229   CO2 25 02/10/2019 1229   GLUCOSE 116 (H) 02/10/2019 1229   BUN 23 02/10/2019 1229   BUN 21 09/07/2017 1617   CREATININE 1.57 (H) 02/10/2019 1229   CALCIUM 9.9 02/10/2019 1229   PROT 7.6 02/10/2019 1229   PROT 7.5 09/07/2017 1617   ALBUMIN 3.4 (L) 02/10/2019 1229   ALBUMIN 4.1 09/07/2017 1617   AST 27 02/10/2019 1229   ALT 19 02/10/2019 1229   ALKPHOS 46 02/10/2019 1229   BILITOT 0.4 02/10/2019 1229   BILITOT 0.5 09/07/2017 1617   GFRNONAA 42 (L) 02/10/2019 1229   GFRAA 49 (L) 02/10/2019 1229   Lab Results  Component Value Date   CHOL 127  01/01/2018   HDL 39 (L) 01/01/2018   LDLCALC 73 01/01/2018   TRIG 73 01/01/2018   CHOLHDL 3.3 01/01/2018   Lab Results  Component Value Date   HGBA1C 5.8 (H) 01/01/2018   No results found for: [id number] Lab Results  Component Value Date   TSH 1.550 09/07/2017     ASSESSMENT AND PLAN 79 y.o. year old male  has a past medical history of CAD (coronary artery disease), Cataract, DJD (degenerative joint disease), DJD (degenerative joint disease), Fluid retention, H/O: gout, History of blood clots, HTN (hypertension), Hyperlipidemia, Hyperlipidemia, Myocardial infarction ([place]) (2009), Obesity, OSA (obstructive sleep apnea), Renal insufficiency, Sleep apnea, and Stroke ([place]) (2000). here with     ICD-10-CM   1. OSA treated with BiPAP  G47.33     Mr Hoffmeier has done  very well with BiPAP therapy since receiving his new mask about week ago.  Compliance report reveals suboptimal compliance, however, he has been consistent with use since receiving new mask.  Leak has also improved.  He does note increased energy with use of BiPAP therapy.  I have encouraged him to continue using BiPAP nightly and for greater than 4 hours each night.  I will have him follow-up with me in 3 months to assess compliance.  He verbalizes understanding and agreement with this plan.   No orders of the defined types were placed in this encounter.    No orders of the defined types were placed in this encounter.     I spent 15 minutes with the patient. 50% of this time was spent counseling and educating patient on plan of care and medications.    Debbora Presto, FNP-C 05/25/2019, 2:27 PM [name] [address] [place], [place] [address] [phone]  I reviewed the above note and documentation by the Nurse Practitioner and agree with the history, exam, assessment and plan as outlined above. I was available for consultation. Star Age, MD, PhD [name])

## 2019-05-25 NOTE — Patient Instructions (Signed)
Please continue using your BiPAP regularly. While your insurance requires that you use BiPAP at least 4 hours each night on 70% of the nights, I recommend, that you not skip any nights and use it throughout the night if you can. Getting used to BiPAP and staying with the treatment long term does take time and patience and discipline. Untreated obstructive sleep apnea when it is moderate to severe can have an adverse impact on cardiovascular health and raise her risk for heart disease, arrhythmias, hypertension, congestive heart failure, stroke and diabetes. Untreated obstructive sleep apnea causes sleep disruption, nonrestorative sleep, and sleep deprivation. This can have an impact on your day to day functioning and cause daytime sleepiness and impairment of cognitive function, memory loss, mood disturbance, and problems focussing. Using BiPAP regularly can improve these symptoms.   Follow up in 3 months   Sleep Apnea Sleep apnea affects breathing during sleep. It causes breathing to stop for a short time or to become shallow. It can also increase the risk of:  Heart attack.  Stroke.  Being very overweight (obese).  Diabetes.  Heart failure.  Irregular heartbeat. The goal of treatment is to help you breathe normally again. What are the causes? There are three kinds of sleep apnea:  Obstructive sleep apnea. This is caused by a blocked or collapsed airway.  Central sleep apnea. This happens when the brain does not send the right signals to the muscles that control breathing.  Mixed sleep apnea. This is a combination of obstructive and central sleep apnea. The most common cause of this condition is a collapsed or blocked airway. This can happen if:  Your throat muscles are too relaxed.  Your tongue and tonsils are too large.  You are overweight.  Your airway is too small. What increases the risk?  Being overweight.  Smoking.  Having a small airway.  Being older.  Being  male.  Drinking alcohol.  Taking medicines to calm yourself (sedatives or tranquilizers).  Having family members with the condition. What are the signs or symptoms?  Trouble staying asleep.  Being sleepy or tired during the day.  Getting angry a lot.  Loud snoring.  Headaches in the morning.  Not being able to focus your mind (concentrate).  Forgetting things.  Less interest in sex.  Mood swings.  Personality changes.  Feelings of sadness (depression).  Waking up a lot during the night to pee (urinate).  Dry mouth.  Sore throat. How is this diagnosed?  Your medical history.  A physical exam.  A test that is done when you are sleeping (sleep study). The test is most often done in a sleep lab but may also be done at home. How is this treated?   Sleeping on your side.  Using a medicine to get rid of mucus in your nose (decongestant).  Avoiding the use of alcohol, medicines to help you relax, or certain pain medicines (narcotics).  Losing weight, if needed.  Changing your diet.  Not smoking.  Using a machine to open your airway while you sleep, such as: ? An oral appliance. This is a mouthpiece that shifts your lower jaw forward. ? A CPAP device. This device blows air through a mask when you breathe out (exhale). ? An EPAP device. This has valves that you put in each nostril. ? A BPAP device. This device blows air through a mask when you breathe in (inhale) and breathe out.  Having surgery if other treatments do not work. It is  important to get treatment for sleep apnea. Without treatment, it can lead to:  High blood pressure.  Coronary artery disease.  In men, not being able to have an erection (impotence).  Reduced thinking ability. Follow these instructions at home: Lifestyle  Make changes that your doctor recommends.  Eat a healthy diet.  Lose weight if needed.  Avoid alcohol, medicines to help you relax, and some pain  medicines.  Do not use any products that contain nicotine or tobacco, such as cigarettes, e-cigarettes, and chewing tobacco. If you need help quitting, ask your doctor. General instructions  Take over-the-counter and prescription medicines only as told by your doctor.  If you were given a machine to use while you sleep, use it only as told by your doctor.  If you are having surgery, make sure to tell your doctor you have sleep apnea. You may need to bring your device with you.  Keep all follow-up visits as told by your doctor. This is important. Contact a doctor if:  The machine that you were given to use during sleep bothers you or does not seem to be working.  You do not get better.  You get worse. Get help right away if:  Your chest hurts.  You have trouble breathing in enough air.  You have an uncomfortable feeling in your back, arms, or stomach.  You have trouble talking.  One side of your body feels weak.  A part of your face is hanging down. These symptoms may be an emergency. Do not wait to see if the symptoms will go away. Get medical help right away. Call your local emergency services (911 in the [place] Do not drive yourself to the hospital. Summary  This condition affects breathing during sleep.  The most common cause is a collapsed or blocked airway.  The goal of treatment is to help you breathe normally while you sleep. This information is not intended to replace advice given to you by your health care provider. Make sure you discuss any questions you have with your health care provider. Document Revised: 12/03/2017 Document Reviewed: 10/12/2017 Elsevier Patient Education  2020 Elsevier Inc.  

## 2019-06-02 ENCOUNTER — Other Ambulatory Visit: Payer: Self-pay | Admitting: Internal Medicine

## 2019-06-12 ENCOUNTER — Ambulatory Visit (INDEPENDENT_AMBULATORY_CARE_PROVIDER_SITE_OTHER): Payer: Medicare Other | Admitting: *Deleted

## 2019-06-12 DIAGNOSIS — I639 Cerebral infarction, unspecified: Secondary | ICD-10-CM

## 2019-06-12 LAB — CUP PACEART REMOTE DEVICE CHECK
Date Time Interrogation Session: 20210411032953
Implantable Pulse Generator Implant Date: 20191104

## 2019-06-13 ENCOUNTER — Encounter: Payer: Self-pay | Admitting: Internal Medicine

## 2019-06-13 NOTE — Progress Notes (Signed)
ILR Remote 

## 2019-06-19 ENCOUNTER — Other Ambulatory Visit: Payer: Self-pay | Admitting: Internal Medicine

## 2019-06-20 NOTE — Telephone Encounter (Signed)
Prescription refill request for Eliquis received.  Last office visit: 11/01/2018, Hilty Scr: 1.57 02/10/2019 Age: 79 y.o. Weight: 113 kg  Prescription refill sent.

## 2019-07-11 ENCOUNTER — Other Ambulatory Visit: Payer: Self-pay | Admitting: Internal Medicine

## 2019-07-12 LAB — CUP PACEART REMOTE DEVICE CHECK
Date Time Interrogation Session: 20210512032303
Implantable Pulse Generator Implant Date: 20191104

## 2019-07-17 ENCOUNTER — Ambulatory Visit (INDEPENDENT_AMBULATORY_CARE_PROVIDER_SITE_OTHER): Payer: Medicare Other | Admitting: *Deleted

## 2019-07-17 DIAGNOSIS — I639 Cerebral infarction, unspecified: Secondary | ICD-10-CM | POA: Diagnosis not present

## 2019-07-17 NOTE — Progress Notes (Signed)
Carelink Summary Report / Loop Recorder 

## 2019-07-20 ENCOUNTER — Telehealth: Payer: Self-pay | Admitting: Internal Medicine

## 2019-07-20 NOTE — Telephone Encounter (Signed)
   Pt c/o medication issue:  1. Name of Medication:   furosemide (LASIX) 20 MG tablet    2. How are you currently taking this medication (dosage and times per day)?  Take 1 tablet (20 mg total) by mouth daily.  3. Are you having a reaction (difficulty breathing--STAT)?   4. What is your medication issue? Pt's wife calling, she said pt been taking half of pill of his fluid pill everyday. When it was refilled recently it says now to take 1 tablet a day. She needs clarification for pt's dosage

## 2019-07-20 NOTE — Telephone Encounter (Signed)
Lm to call back ./cy 

## 2019-07-21 ENCOUNTER — Telehealth: Payer: Self-pay | Admitting: Internal Medicine

## 2019-07-21 MED ORDER — FUROSEMIDE 20 MG PO TABS: 10.0000 mg | ORAL_TABLET | Freq: Every day | ORAL | 3 refills | Status: DC

## 2019-07-21 NOTE — Telephone Encounter (Signed)
See previous encounter for this date

## 2019-07-21 NOTE — Telephone Encounter (Signed)
After review of chart patient should be taking 20 mg 1/2 tablet daily. Can not see where we changed the dose.

## 2019-07-21 NOTE — Telephone Encounter (Signed)
Follow Up:    Pt is returning a call from this morning.

## 2019-07-21 NOTE — Telephone Encounter (Signed)
Spoke with the pts wife and she agrees that he has been on 1/2 tablet and will pick up the RX at [facility].. we went ahead and made the pt recall appt with Dr. Debara Pickett 11/03/19.

## 2019-08-08 ENCOUNTER — Ambulatory Visit: Payer: Medicare Other | Admitting: Podiatry

## 2019-08-21 ENCOUNTER — Ambulatory Visit (INDEPENDENT_AMBULATORY_CARE_PROVIDER_SITE_OTHER): Payer: Medicare Other | Admitting: *Deleted

## 2019-08-21 DIAGNOSIS — I639 Cerebral infarction, unspecified: Secondary | ICD-10-CM | POA: Diagnosis not present

## 2019-08-21 LAB — CUP PACEART REMOTE DEVICE CHECK
Date Time Interrogation Session: 20210620235912
Implantable Pulse Generator Implant Date: 20191104

## 2019-08-21 NOTE — Progress Notes (Signed)
Carelink Summary Report / Loop Recorder 

## 2019-08-28 ENCOUNTER — Encounter: Payer: Self-pay | Admitting: Family Medicine

## 2019-08-28 ENCOUNTER — Ambulatory Visit: Payer: Medicare Other | Admitting: Family Medicine

## 2019-09-05 ENCOUNTER — Telehealth: Payer: Self-pay | Admitting: Family Medicine

## 2019-09-05 NOTE — Telephone Encounter (Signed)
Patient is scheduled for a bipap follow up tomorrow with Amy at 9am. Per his download, he has not used his bipap since April 2021. I called and spoke with his wife [name]. She stated that he has tried numerous times to use the Bipap. The machine will work for about a week after adjustments but will revert back. He has also tried numerous masks but they all will develop a leak within 2-3 weeks.   I asked if she thought that he would like to keep his appointment tomorrow. She stated that she would ask him once he returns home in a few minutes. I advised her that since the phones turn off at 5pm, I would call after 5pm. I provided her with my name and number. She verbalized understanding.   Will call back at 5pm.

## 2019-09-05 NOTE — Telephone Encounter (Signed)
Called and spoke with [place]. She stated that the patient had not returned home yet. I advised her that I would keep the appt as is for now. If he decides he does not want to come tomorrow, he can call in the morning. She verbalized understanding.   Nothing further needed at time of call.

## 2019-09-06 ENCOUNTER — Ambulatory Visit: Payer: Medicare Other | Admitting: Family Medicine

## 2019-09-06 ENCOUNTER — Encounter: Payer: Self-pay | Admitting: Family Medicine

## 2019-09-25 ENCOUNTER — Ambulatory Visit (INDEPENDENT_AMBULATORY_CARE_PROVIDER_SITE_OTHER): Payer: Medicare Other | Admitting: *Deleted

## 2019-09-25 DIAGNOSIS — I639 Cerebral infarction, unspecified: Secondary | ICD-10-CM | POA: Diagnosis not present

## 2019-09-25 LAB — CUP PACEART REMOTE DEVICE CHECK
Date Time Interrogation Session: 20210725232810
Implantable Pulse Generator Implant Date: 20191104

## 2019-09-26 NOTE — Progress Notes (Signed)
Carelink Summary Report / Loop Recorder 

## 2019-10-12 ENCOUNTER — Ambulatory Visit: Payer: Medicare Other | Admitting: Podiatry

## 2019-10-12 ENCOUNTER — Other Ambulatory Visit: Payer: Self-pay

## 2019-10-12 ENCOUNTER — Encounter: Payer: Self-pay | Admitting: Podiatry

## 2019-10-12 DIAGNOSIS — D689 Coagulation defect, unspecified: Secondary | ICD-10-CM | POA: Insufficient documentation

## 2019-10-12 DIAGNOSIS — I739 Peripheral vascular disease, unspecified: Secondary | ICD-10-CM | POA: Diagnosis not present

## 2019-10-12 DIAGNOSIS — L84 Corns and callosities: Secondary | ICD-10-CM | POA: Diagnosis not present

## 2019-10-12 DIAGNOSIS — M79675 Pain in left toe(s): Secondary | ICD-10-CM | POA: Diagnosis not present

## 2019-10-12 DIAGNOSIS — B351 Tinea unguium: Secondary | ICD-10-CM

## 2019-10-12 DIAGNOSIS — M79674 Pain in right toe(s): Secondary | ICD-10-CM

## 2019-10-12 NOTE — Progress Notes (Signed)
This patient returns to my office for at risk foot care.  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  Patient is taking eliquis.  This patient is unable to cut nails himself since the patient cannot reach his nails.These nails are painful walking and wearing shoes.  This patient presents for at risk foot care today.  General Appearance  Alert, conversant and in no acute stress.  Vascular  Dorsalis pedis  are palpable  bilaterally.   Posterior tibial pulses are not palpable.Capillary return is within normal limits  bilaterally. Temperature is within normal limits  bilaterally.  Neurologic  Senn-Weinstein monofilament wire test within normal limits  bilaterally. Muscle power within normal limits bilaterally.  Nails Thick disfigured discolored nails with subungual debris  from hallux to fifth toes bilaterally. No evidence of bacterial infection or drainage bilaterally.  Orthopedic  No limitations of motion  feet .  No crepitus or effusions noted.  No bony pathology or digital deformities noted.  Skin  normotropic skin with no porokeratosis noted bilaterally.  No signs of infections or ulcers noted.   Pinch callus  B/L.  Onychomycosis  Pain in right toes  Pain in left toes  Pinch callus.  Consent was obtained for treatment procedures.   Mechanical debridement of nails 1-5  bilaterally performed with a nail nipper.  Filed with dremel without incident.  Debride callus with # 15 blade.   Return office visit    3 months                 Told patient to return for periodic foot care and evaluation due to potential at risk complications.   Gardiner Barefoot DPM

## 2019-10-30 ENCOUNTER — Telehealth: Payer: Self-pay | Admitting: *Deleted

## 2019-10-30 ENCOUNTER — Ambulatory Visit (INDEPENDENT_AMBULATORY_CARE_PROVIDER_SITE_OTHER): Payer: Medicare Other | Admitting: *Deleted

## 2019-10-30 DIAGNOSIS — I639 Cerebral infarction, unspecified: Secondary | ICD-10-CM | POA: Diagnosis not present

## 2019-10-30 LAB — CUP PACEART REMOTE DEVICE CHECK
Date Time Interrogation Session: 20210830103929
Implantable Pulse Generator Implant Date: 20191104

## 2019-10-30 NOTE — Telephone Encounter (Signed)
Spoke with pt to request manual LINQ transmission for review of untransmitted episodes (1 tachy, 19 "AF"). Pt verbalizes understanding and agrees to send transmission today. He denies questions at this time.

## 2019-10-30 NOTE — Telephone Encounter (Signed)
Manual transmission received and reviewed. 2 tachy episodes appear SVT vs ST 150s-160s (gradual onset). 32 "AF" episodes appear SR w/ transient 2:1 AV block (nocturnal per time stamps) and ectopy. No true AF noted. Will discuss reprogramming options with Dr. Lovena Le to minimize alerts/false detections going forward.

## 2019-10-31 NOTE — Progress Notes (Signed)
Carelink Summary Report / Loop Recorder 

## 2019-11-01 NOTE — Telephone Encounter (Signed)
Reviewed with Dr. Lovena Le. Plan to reprogram AF sensitivity to less sensitive and AF episode storage to >=6 min. Pt agreeable to DC appointment on 11/21/19 at 3:00pm for reprogramming.

## 2019-11-03 ENCOUNTER — Other Ambulatory Visit: Payer: Self-pay

## 2019-11-03 ENCOUNTER — Ambulatory Visit (INDEPENDENT_AMBULATORY_CARE_PROVIDER_SITE_OTHER): Payer: Medicare Other | Admitting: Internal Medicine

## 2019-11-03 ENCOUNTER — Encounter: Payer: Self-pay | Admitting: Internal Medicine

## 2019-11-03 VITALS — BP 122/74 | HR 49 | Ht 74.0 in | Wt 250.2 lb

## 2019-11-03 DIAGNOSIS — Z8673 Personal history of transient ischemic attack (TIA), and cerebral infarction without residual deficits: Secondary | ICD-10-CM | POA: Diagnosis not present

## 2019-11-03 DIAGNOSIS — R001 Bradycardia, unspecified: Secondary | ICD-10-CM | POA: Diagnosis not present

## 2019-11-03 DIAGNOSIS — I10 Essential (primary) hypertension: Secondary | ICD-10-CM | POA: Diagnosis not present

## 2019-11-03 DIAGNOSIS — I2510 Atherosclerotic heart disease of native coronary artery without angina pectoris: Secondary | ICD-10-CM

## 2019-11-03 DIAGNOSIS — E7800 Pure hypercholesterolemia, unspecified: Secondary | ICD-10-CM

## 2019-11-03 MED ORDER — METOPROLOL TARTRATE 25 MG PO TABS: 12.5000 mg | ORAL_TABLET | Freq: Two times a day (BID) | ORAL | 3 refills | Status: DC

## 2019-11-03 NOTE — Patient Instructions (Signed)
Medication Instructions:  Decrease Metoprolol to 12.5 mg (0.5 tablet) twice daily *If you need a refill on your cardiac medications before your next appointment, please call your pharmacy*  Lab Work: None Ordered At This Time.  If you have labs (blood work) drawn today and your tests are completely normal, you will receive your results only by: [name] MyChart Message (if you have MyChart) OR . A paper copy in the mail If you have any lab test that is abnormal or we need to change your treatment, we will call you to review the results.  Testing/Procedures: None Ordered At This Time.   Follow-Up: At [facility], you and your health needs are our priority.  As part of our continuing mission to provide you with exceptional heart care, we have created designated Provider Care Teams.  These Care Teams include your primary Cardiologist (physician) and Advanced Practice Providers (APPs -  Physician Assistants and Nurse Practitioners) who all work together to provide you with the care you need, when you need it.  We recommend signing up for the patient portal called "MyChart".  Sign up information is provided on this After Visit Summary.  MyChart is used to connect with patients for Virtual Visits (Telemedicine).  Patients are able to view lab/test results, encounter notes, upcoming appointments, etc.  Non-urgent messages can be sent to your provider as well.   To learn more about what you can do with MyChart, go to [email or web address].    Your next appointment:   3 month(s)  The format for your next appointment:   In Person  Provider:   K. Mali Hilty, MD

## 2019-11-03 NOTE — Progress Notes (Signed)
OFFICE NOTE  Chief Complaint:  Follow-up  Primary Care Physician: Rogers Blocker, MD  HPI:  [name] is a 79 y.o. male with a past medial history significant for coronary artery disease status post CABG in 2005 (LIMA to LAD, SVG to first OM and SVG to second OM and SVG to RCA (, hypertension, dyslipidemia and gout.  He was last seen in July by Leonia Reader, DNP, which time he had a stress test which was negative for ischemia.  I had met him in October 2018 at the time when he was hospitalized for bacteremia and had a TEE then which was negative for vegetation.  Recently he unfortunately had a stroke in November and had a repeat TEE which was unremarkable.  Subsequently he had an implanted loop recorder placed by Dr. Lovena Le.  This did demonstrate 2-1 atrial flutter with 10 episodes in January and he ultimately has been placed on Eliquis.  He seems to be tolerating this.  He was unaware of any A. fib.  He continues in neuro rehab.  11/01/2018  [name] is seen today in follow-up.  Overall he continues to do well.  He denies any chest pain or worsening shortness of breath.  His blood pressure is well controlled today.  Heart rate is low normal.  His only complaint really is some itchiness of his sternal scars which is likely from keloids.  EKG showed sinus bradycardia with some PVCs.  He is asymptomatic denying any palpitations or awareness of his heart rate.  He denies any recurrent atrial fibrillation.  He remains on Eliquis for anticoagulation.  No bleeding issues.  Cholesterol is followed by his primary care provider Dr. Marlou Sa.  11/03/2018  Mr. Fast returns today for annual follow-up.  Overall he is feeling well.  He denies any recurrent stroke or TIA.  He continues to get checks of his loop recorder which still has battery life.  Blood pressure is excellent today 122/74.  He is due for repeat lipids.  He has felt somewhat fatigued and notably his heart rate was in the 40s today with a sinus  bradycardia and first-degree AV block.  PMHx:  Past Medical History:  Diagnosis Date  . CAD (coronary artery disease)    s/p cath in 2005 and CABG x4  . Cataract   . DJD (degenerative joint disease)   . DJD (degenerative joint disease)   . Fluid retention    mild  . H/O: gout   . History of blood clots   . HTN (hypertension)   . Hyperlipidemia   . Hyperlipidemia   . Myocardial infarction ([place]) 2009  . Obesity    with reduction  . OSA (obstructive sleep apnea)    AHI 98/hr  . Renal insufficiency   . Sleep apnea   . Stroke [facility]) 2000    Past Surgical History:  Procedure Laterality Date  . CORONARY ARTERY BYPASS GRAFT  2005   LIMA to LAD, SVG to OM1 and OM 2, RCA.   [name] left inguinal hernia repair    . LOOP RECORDER INSERTION N/A 01/03/2018   Procedure: LOOP RECORDER INSERTION;  Surgeon: Evans Lance, MD;  Location: [place] CV LAB;  Service: Cardiovascular;  Laterality: N/A;  . TEE WITHOUT CARDIOVERSION N/A 12/11/2016   Procedure: TRANSESOPHAGEAL ECHOCARDIOGRAM (TEE);  Surgeon: [name], MD;  Location: [facility] ENDOSCOPY;  Service: Cardiovascular;  Laterality: N/A;  . TEE WITHOUT CARDIOVERSION N/A 01/03/2018   Procedure: TRANSESOPHAGEAL ECHOCARDIOGRAM (TEE);  Surgeon:  Dorothy Spark, MD;  Location: [facility] ENDOSCOPY;  Service: Cardiovascular;  Laterality: N/A;    FAMHx:  Family History  Problem Relation Age of Onset  . Gout Mother   . Stroke Mother   . Coronary artery disease Mother   . Hypertension Mother   . Arthritis Mother   . Coronary artery disease Father   . Arthritis Father   . Gout Father   . Stroke Father   . Hypertension Father   . Coronary artery disease Brother   . Arthritis Brother   . Gout Brother   . Stroke Brother   . Hypertension Brother   . Coronary artery disease Brother   . Arthritis Brother   . Gout Brother   . Stroke Brother   . Hypertension Brother   . Coronary artery disease Other        significant in multiple family members      SOCHx:   reports that he has quit smoking. His smoking use included cigars. He has never used smokeless tobacco. He reports that he does not drink alcohol and does not use drugs.  ALLERGIES:  Allergies  Allergen Reactions  . Ramipril Cough    ROS: Pertinent items noted in HPI and remainder of comprehensive ROS otherwise negative.  HOME MEDS: Current Outpatient Medications on File Prior to Visit  Medication Sig Dispense Refill  . acetaminophen (TYLENOL) 325 MG tablet Take 1-2 tablets (325-650 mg total) by mouth every 4 (four) hours as needed for mild pain.    [name] allopurinol (ZYLOPRIM) 100 MG tablet Take 200 mg by mouth daily.     [name] amLODipine (NORVASC) 5 MG tablet Take 1 tablet by mouth once daily 90 tablet 1  . ELIQUIS 5 MG TABS tablet Take 1 tablet by mouth twice daily 180 tablet 1  . furosemide (LASIX) 20 MG tablet Take 0.5 tablets (10 mg total) by mouth daily. 30 tablet 3  . Menthol-Methyl Salicylate (MUSCLE RUB) 10-15 % CREA Apply 1 application topically 4 (four) times daily -  with meals and at bedtime. To neck and shoulders  0  . metoprolol tartrate (LOPRESSOR) 25 MG tablet TAKE ONE TABLET BY MOUTH TWICE DAILY (Patient taking differently: Take 25 mg by mouth 2 (two) times daily. ) 30 tablet 0  . montelukast (SINGULAIR) 10 MG tablet Take 10 mg by mouth at bedtime.    . niacin (NIASPAN) 500 MG CR tablet Take 500 mg by mouth at bedtime.      . ondansetron (ZOFRAN ODT) 8 MG disintegrating tablet Take 1 tablet (8 mg total) by mouth every 8 (eight) hours as needed for nausea. 20 tablet 0  . pantoprazole (PROTONIX) 40 MG tablet Take 1 tablet (40 mg total) by mouth daily. 30 tablet 0  . polyethylene glycol (MIRALAX / GLYCOLAX) packet Take 17 g by mouth daily. (Patient taking differently: Take 17 g by mouth daily as needed for mild constipation. ) 60 each 0  . potassium chloride (KLOR-CON) 10 MEQ tablet Take 10 mEq by mouth daily.    . rosuvastatin (CRESTOR) 10 MG tablet Take 10 mg by  mouth at bedtime.    . tamsulosin (FLOMAX) 0.4 MG CAPS capsule Take 1 capsule (0.4 mg total) by mouth daily. 30 capsule    No current facility-administered medications on file prior to visit.    LABS/IMAGING: No results found for this or any previous visit (from the past 48 hour(s)). No results found.  LIPID PANEL:    Component Value Date/Time  CHOL 127 01/01/2018 0308   CHOL 186 09/07/2017 1617   TRIG 73 01/01/2018 0308   HDL 39 (L) 01/01/2018 0308   HDL 39 (L) 09/07/2017 1617   CHOLHDL 3.3 01/01/2018 0308   VLDL 15 01/01/2018 0308   LDLCALC 73 01/01/2018 0308   LDLCALC 126 (H) 09/07/2017 1617     WEIGHTS: Wt Readings from Last 3 Encounters:  11/03/19 250 lb 3.2 oz (113.5 kg)  05/25/19 249 lb 3.2 oz (113 kg)  03/28/19 252 lb 3.2 oz (114.4 kg)    VITALS: BP 122/74   Pulse (!) 49   Ht _0  (1.88 m)   Wt 250 lb 3.2 oz (113.5 kg)   SpO2 97%   BMI 32.12 kg/m   EXAM: General appearance: alert and no distress Neck: no carotid bruit, no JVD and thyroid not enlarged, symmetric, no tenderness/mass/nodules Lungs: clear to auscultation bilaterally Heart: regular rate and rhythm Abdomen: soft, non-tender; bowel sounds normal; no masses,  no organomegaly Extremities: extremities normal, atraumatic, no cyanosis or edema Pulses: 2+ and symmetric Skin: Skin color, texture, turgor normal. No rashes or lesions Neurologic: Mental status: Alert, oriented, thought content appropriate, 4 out of 5 left arm strength Psych: Pleasant  EKG: Sinus bradycardia at 49 with first-degree AV block-personally reviewed  ASSESSMENT: 1. CAD status post CABG x4 in 2005 (LIMA to LAD, SVG to OM1 and OM 2, SVG to RCA), low risk Myoview stress test with normal LV function (08/2017) - LVEF 52% 2. Hypertension 3. Dyslipidemia 4. Stroke (12/2017) - s/p ILR 5. Paroxysmal atrial flutter-anticoagulated on Eliquis - CHADVASC score of 5  PLAN: 1.   Mr. Gordillo seems to be doing well without any chest pain  or shortness of breath.  He denies any recurrent palpitations.  He has ongoing checks of his loop recorder which still has battery life.  Blood pressure is excellent.  He is due for repeat lipid profile which we will check.  He is also had some fatigue and questionable symptomatic bradycardia.  Vies decreasing his metoprolol tartrate from 25 mg to 12.5 mg twice daily.  Follow-up with me in 3 months to see if he is symptomatically improved.  [name], MD, [facility], [place] Director of the Advanced Lipid Disorders &  Cardiovascular Risk Reduction Clinic Diplomate of the American Board of Clinical Lipidology Attending Cardiologist  Direct Dial: [phone]  Fax: [phone]  Website:  [email or web address] 11/03/2019, 11:40 AM

## 2019-11-05 ENCOUNTER — Encounter: Payer: Self-pay | Admitting: Internal Medicine

## 2019-11-21 ENCOUNTER — Ambulatory Visit (INDEPENDENT_AMBULATORY_CARE_PROVIDER_SITE_OTHER): Payer: Medicare Other | Admitting: Emergency Medicine

## 2019-11-21 ENCOUNTER — Other Ambulatory Visit: Payer: Self-pay

## 2019-11-21 DIAGNOSIS — I639 Cerebral infarction, unspecified: Secondary | ICD-10-CM

## 2019-11-21 LAB — CUP PACEART INCLINIC DEVICE CHECK
Date Time Interrogation Session: 20210921141000
Implantable Pulse Generator Implant Date: 20191104

## 2019-11-21 NOTE — Progress Notes (Signed)
Loop check in clinic. Battery status Good. R-waves 0.58 mV. 0  symptom episodes, 78 tachy episodes, 0  pause episodes, 0 brady episodes. 98 AF episodes (_0.1% burden). Due to false episodes per Dr Lovena Le AF storage programmed to > 6 minutes   and AF sensitivity to  less sensitive.  Monthly summary reports and ROV prn.Patient to send remote transmission when he returns home.

## 2019-11-21 NOTE — Patient Instructions (Signed)
Send remote transmission when you get home. Call the device clinic if you need help sending the transmission. [phone]

## 2019-11-30 LAB — CUP PACEART REMOTE DEVICE CHECK
Date Time Interrogation Session: 20210929233726
Implantable Pulse Generator Implant Date: 20191104

## 2019-12-04 ENCOUNTER — Ambulatory Visit (INDEPENDENT_AMBULATORY_CARE_PROVIDER_SITE_OTHER): Payer: Medicare Other

## 2019-12-04 DIAGNOSIS — I639 Cerebral infarction, unspecified: Secondary | ICD-10-CM | POA: Diagnosis not present

## 2019-12-05 NOTE — Progress Notes (Signed)
Carelink Summary Report / Loop Recorder 

## 2019-12-18 ENCOUNTER — Ambulatory Visit: Payer: Medicare Other | Admitting: Podiatry

## 2019-12-20 ENCOUNTER — Ambulatory Visit: Payer: Medicare Other | Attending: Internal Medicine

## 2019-12-20 ENCOUNTER — Other Ambulatory Visit (HOSPITAL_COMMUNITY): Payer: Self-pay | Admitting: Internal Medicine

## 2019-12-20 DIAGNOSIS — Z23 Encounter for immunization: Secondary | ICD-10-CM

## 2019-12-20 NOTE — Progress Notes (Signed)
   Covid-19 Vaccination Clinic  Name:  [name]    MRN: [id number] DOB: 1940/04/02  12/20/2019  [name] was observed post Covid-19 immunization for 15 minutes without incident. He was provided with Vaccine Information Sheet and instruction to access the V-Safe system.   [name] was instructed to call 911 with any severe reactions post vaccine: [name] Difficulty breathing  . Swelling of face and throat  . A fast heartbeat  . A bad rash all over body  . Dizziness and weakness

## 2019-12-24 ENCOUNTER — Other Ambulatory Visit: Payer: Self-pay | Admitting: Internal Medicine

## 2020-01-03 LAB — CUP PACEART REMOTE DEVICE CHECK
Date Time Interrogation Session: 20211101233716
Implantable Pulse Generator Implant Date: 20191104

## 2020-01-08 ENCOUNTER — Ambulatory Visit (INDEPENDENT_AMBULATORY_CARE_PROVIDER_SITE_OTHER): Payer: Medicare Other

## 2020-01-08 DIAGNOSIS — I639 Cerebral infarction, unspecified: Secondary | ICD-10-CM

## 2020-01-08 NOTE — Progress Notes (Signed)
Carelink Summary Report / Loop Recorder 

## 2020-01-15 ENCOUNTER — Ambulatory Visit: Payer: Medicare Other | Admitting: Podiatry

## 2020-01-28 IMAGING — MR MR HEAD W/O CM
11 of 13 series · 30 of 48 positions shown · non-contrast
Comparison: CT head without contrast 01/01/2018. CTA head and neck
01/01/2018.

CLINICAL DATA: Left-sided weakness. Focal neuro deficit of greater
than 6 hours.

EXAM:
MRI HEAD WITHOUT CONTRAST
TECHNIQUE: Multiplanar, multiecho pulse sequences of the brain and surrounding
structures were obtained without intravenous contrast.

[Series 4: DWI · axial · 3.0mm · 0.94mm/px · z∈[-141,+22]mm · 6 of 112 slices shown (1 of 2)]
[im 1/112]
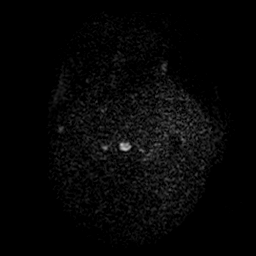
[im 23/112]
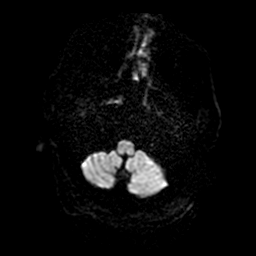
[im 45/112]
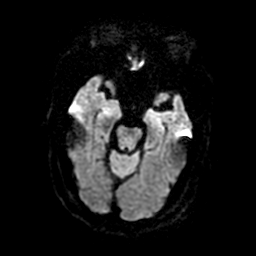
[im 67/112]
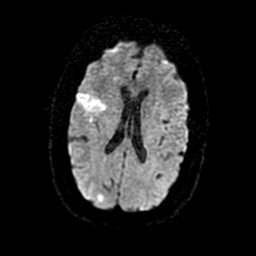
[im 89/112]
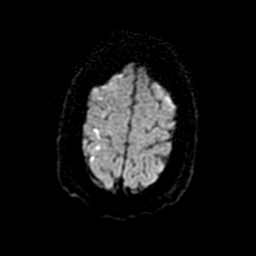
[im 112/112]
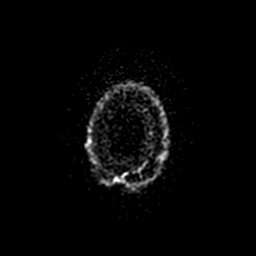

[Series 5: DWI · coronal · 4.0mm · 0.94mm/px · 4 of 72 slices shown (2 of 2)]
[im 1/72]
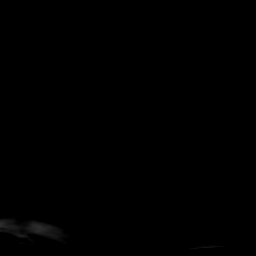
[im 24/72]
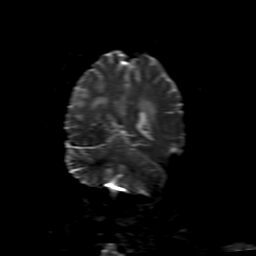
[im 48/72]
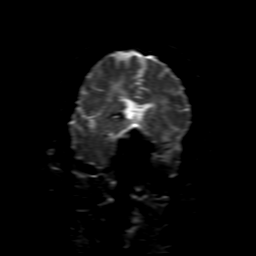
[im 72/72]
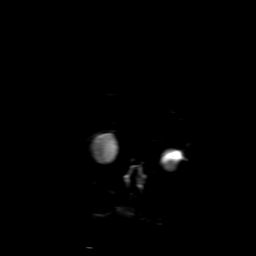

[Series 6: T1 · axial · 5.0mm · 0.47mm/px · z∈[-150,+17]mm · 2 of 29 slices shown (1 of 2)]
[im 1/29]
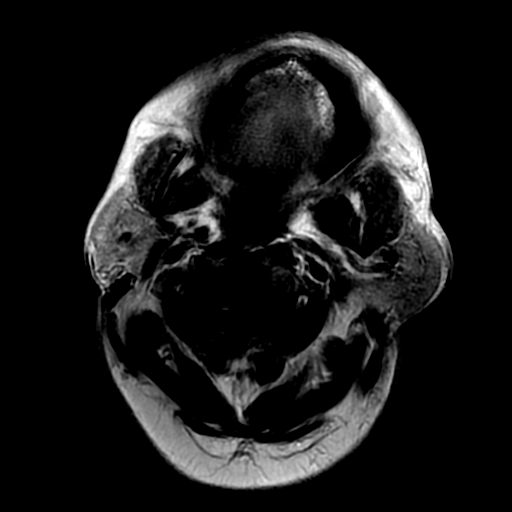
[im 29/29]
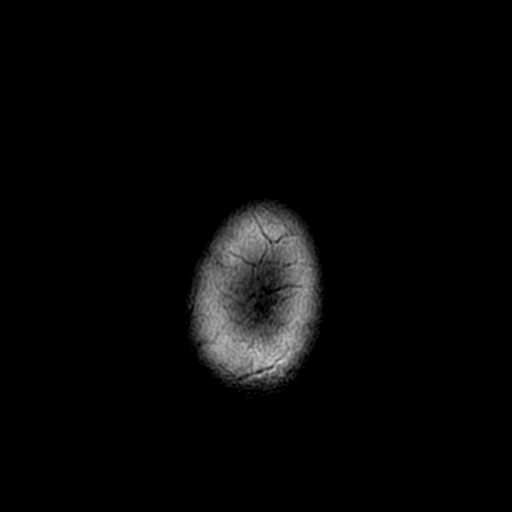

[Series 7: T1 · sagittal · 5.0mm · 0.47mm/px · 2 of 25 slices shown (2 of 2)]
[im 1/25]
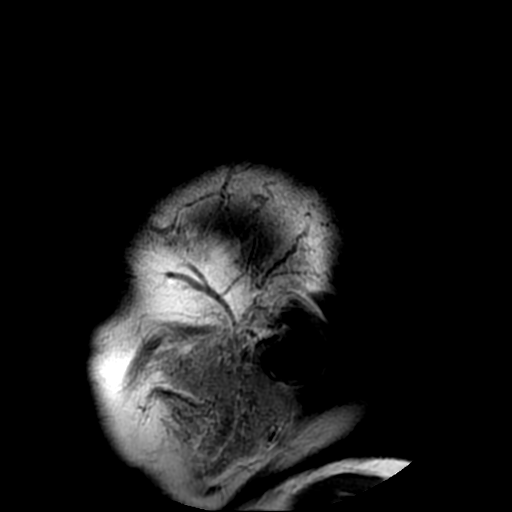
[im 25/25]
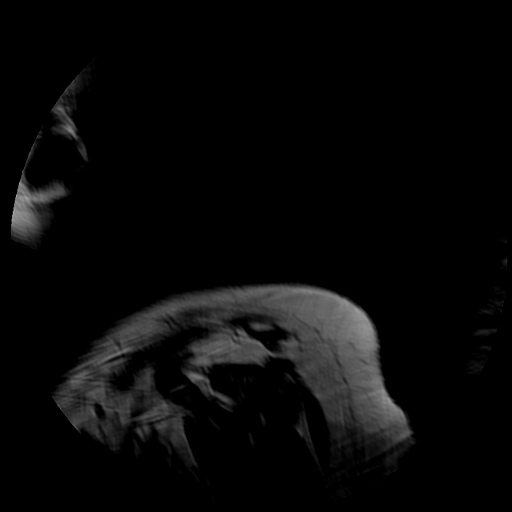

[Series 8: FLAIR · sagittal · 5.0mm · 0.47mm/px · 2 of 25 slices shown (1 of 3)]
[im 1/25]
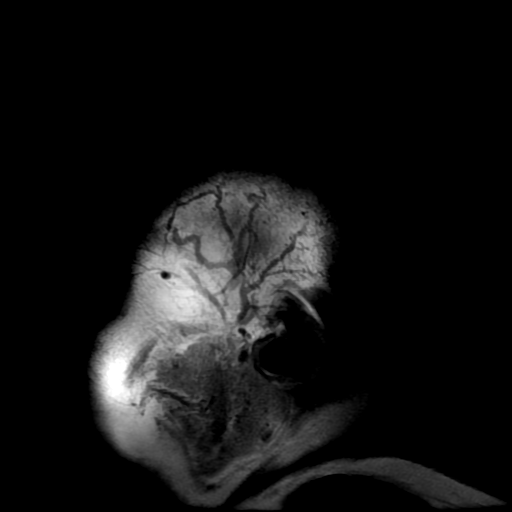
[im 25/25]
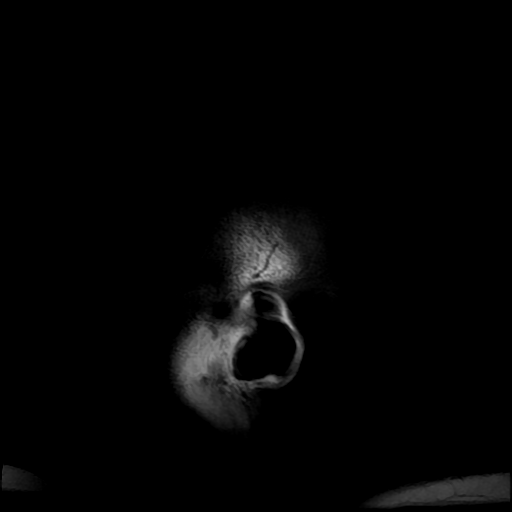

[Series 9: FLAIR · axial · 5.0mm · 0.47mm/px · z∈[-150,+17]mm · 2 of 29 slices shown (2 of 3)]
[im 1/29]
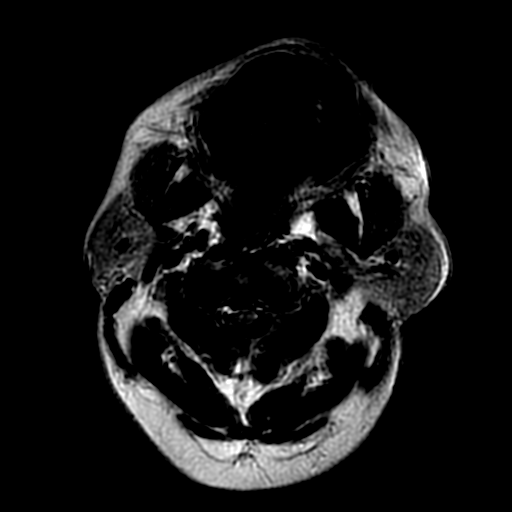
[im 29/29]
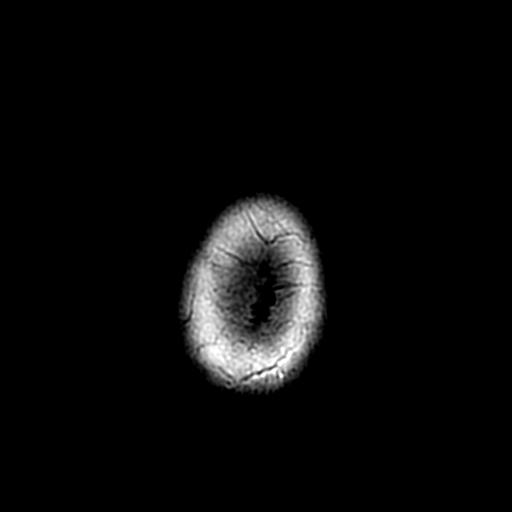

[Series 10: T2 · coronal · 5.0mm · 0.47mm/px · 2 of 30 slices shown]
[im 1/30]
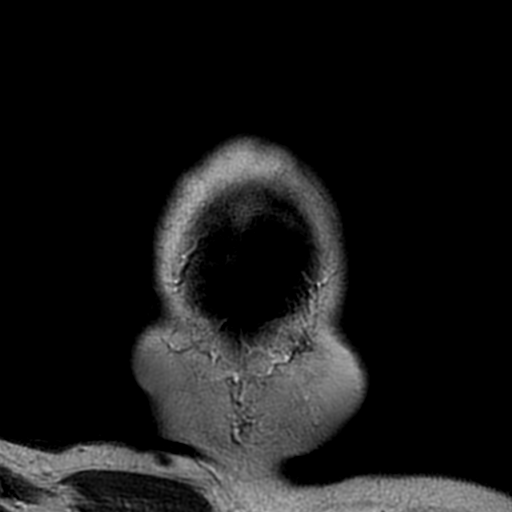
[im 30/30]
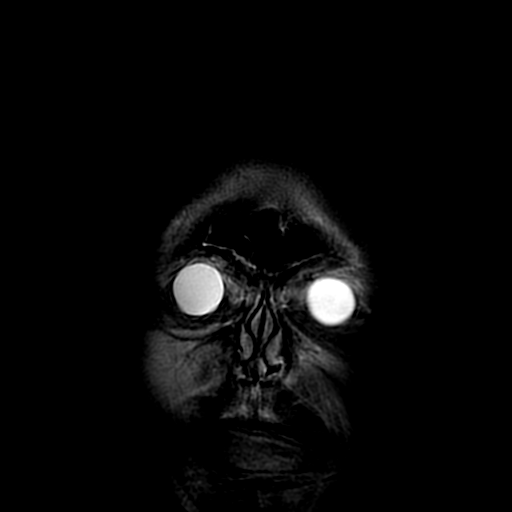

[Series 11: FLAIR · axial · 3.0mm · 0.47mm/px · z∈[-131,+13]mm · 2 of 25 slices shown (3 of 3)]
[im 1/25]
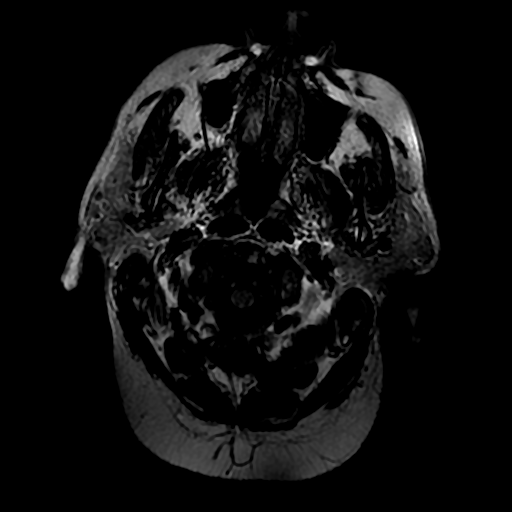
[im 25/25]
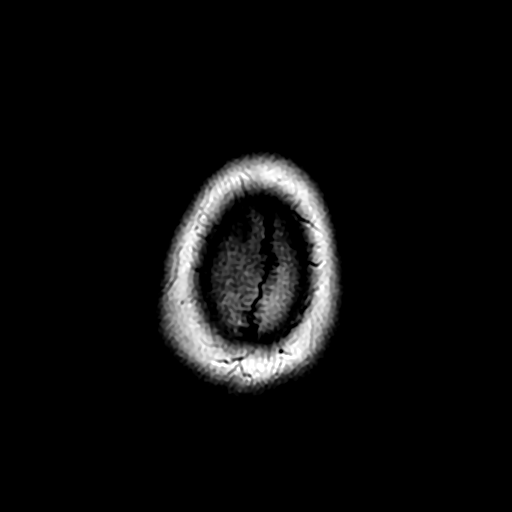

[Series 12: (person_name) · axial · 3.0mm · 0.47mm/px · z∈[-152,-128]mm · 2 of 116 slices shown]
[im 1/116]
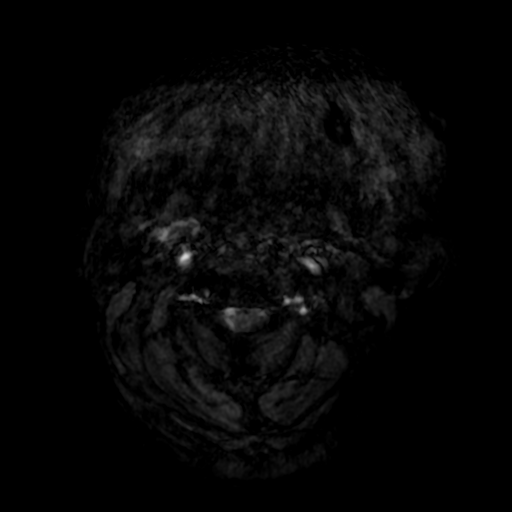
[im 17/116]
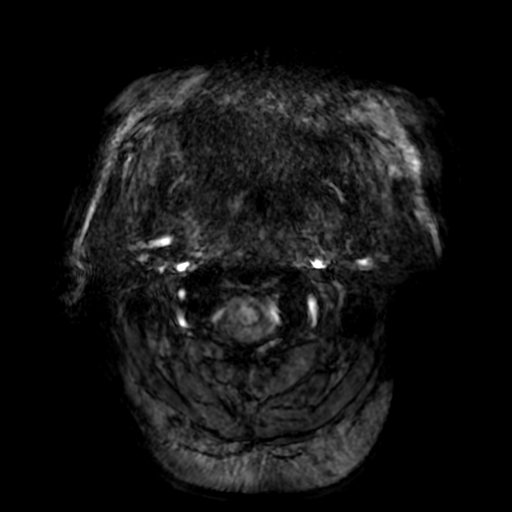

[Series 450: ADC · axial · 3.0mm · 0.94mm/px · z∈[-141,+22]mm · 4 of 56 slices shown (1 of 2)]
[im 1/56]
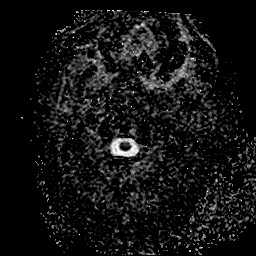
[im 19/56]
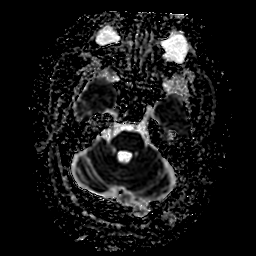
[im 37/56]
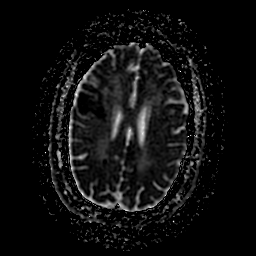
[im 56/56]
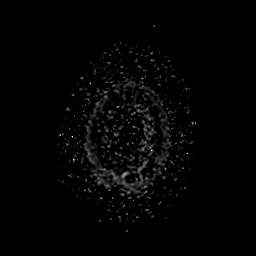

[Series 550: ADC · coronal · 4.0mm · 0.94mm/px · 2 of 36 slices shown (2 of 2)]
[im 1/36]
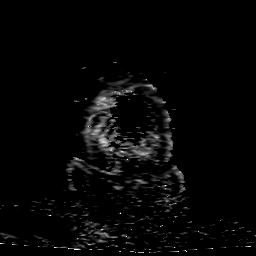
[im 36/36]
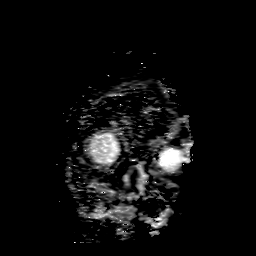

[30 of 48 positions shown; findings below may reference images not displayed]

FINDINGS: Brain: The diffusion-weighted images demonstrate a confluent
nonhemorrhagic infarct in the right frontal operculum. Additional
scattered areas of nonhemorrhagic infarct are present over the right
frontal and parietal lobe as well as the right occipital lobe. The
pattern follows a watershed distribution. T2 signal changes are
associated with the areas of restricted diffusion. Additional
confluent periventricular T2 signal changes are present bilaterally.

Ventricles are of normal size. No significant extra-axial fluid
collection present. A remote hemorrhagic lacunar infarct is present
the right thalamus or posterior limb internal capsule. There is a
remote hemorrhage in the anterior left frontal lobe.

Vascular: Flow is present in the major intracranial arteries.

Skull and upper cervical spine: The craniocervical junction is
normal. Upper cervical spine is unremarkable. Marrow signal is
somewhat depressed. This likely corresponds with anemia.

Sinuses/Orbits: The paranasal sinuses and mastoid air cells are
clear. Globes and orbits are within normal limits.
IMPRESSION: 1. Acute/subacute right MCA territory infarcts involving the right
frontal operculum an and diffusely along the right frontal and
parietal lobes. The area along the convexity may be in a watershed
distribution.
2. Additional confluent white matter changes are present
bilaterally, likely reflecting the sequela of chronic microvascular
ischemia.
3. Remote lacunar infarcts of the basal ganglia including a remote
hemorrhagic infarct of the right thalamus.

## 2020-02-05 ENCOUNTER — Ambulatory Visit: Payer: Medicare Other | Admitting: Internal Medicine

## 2020-02-09 ENCOUNTER — Other Ambulatory Visit: Payer: Self-pay | Admitting: Internal Medicine

## 2020-02-10 LAB — CUP PACEART REMOTE DEVICE CHECK
Date Time Interrogation Session: 20211204224220
Implantable Pulse Generator Implant Date: 20191104

## 2020-02-12 ENCOUNTER — Ambulatory Visit (INDEPENDENT_AMBULATORY_CARE_PROVIDER_SITE_OTHER): Payer: Medicare Other

## 2020-02-12 DIAGNOSIS — I639 Cerebral infarction, unspecified: Secondary | ICD-10-CM | POA: Diagnosis not present

## 2020-02-12 NOTE — Telephone Encounter (Signed)
Prescription refill request for Eliquis received. Hx: cva Last office visit: Hilty, 11/03/2019 Scr: 1.25, 06/13/2019 Age: 79 yo Weight: 113.5 kg   Prescription refill sent.

## 2020-02-27 NOTE — Progress Notes (Signed)
Carelink Summary Report / Loop Recorder 

## 2020-03-13 ENCOUNTER — Encounter: Payer: Self-pay | Admitting: Rehabilitation

## 2020-03-13 NOTE — Therapy (Signed)
[facility] [address] [place], [place], [address] Phone: [phone]   Fax:  [phone]  Patient Details  Name: [name] MRN: [id number] Date of Birth: 02-Apr-1940 Referring Provider:  No ref. provider found  Encounter Date: 03/13/2020   PHYSICAL THERAPY DISCHARGE SUMMARY  Visits from Start of Care: 7  Current functional level related to goals / functional outcomes:  PT Long Term Goals - 05/05/18 1058      PT LONG TERM GOAL #1   Title Pt will be independent with final HEP in order to indicate decreased fall risk and improved functional mobility.  (Target Date: 06/04/2018)    Time 8    Period Weeks    Status New      PT LONG TERM GOAL #2   Title Pt will improve FGA to >/=24/30 in order to indicate decreased fall risk.      Time 8    Period Weeks    Status New      PT LONG TERM GOAL #3   Title Pt will improve 6MWT by 300' from baseline in order to indicate improved functional endurance.     Time 8    Period Weeks    Status New      PT LONG TERM GOAL #4   Title Pt will ambulate up to 1000' over varying outdoor surfaces (grass, inclines/declines, curb/ramp) without AD at mod I level in order to indicate improved community mobility.      Time 8    Period Weeks    Status New      PT LONG TERM GOAL #5   Title Pt will demonstrate ability to simulate bowling without overt LOB in order to return to leisure activity.     Time 8    Period Weeks    Status New             Remaining deficits: Unsure as pt did not return following clinic closure in 2020 due to [place].    Education / Equipment: HEP   Plan: Patient agrees to discharge.  Patient goals were not met. Patient is being discharged due to not returning since the last visit.  ?????    Cameron Sprang, PT, MPT [facility] [address] [place], [place], [address] Phone: [phone]   Fax:   [phone] 03/13/20, 1:45 PM

## 2020-03-18 ENCOUNTER — Ambulatory Visit (INDEPENDENT_AMBULATORY_CARE_PROVIDER_SITE_OTHER): Payer: Medicare Other

## 2020-03-18 DIAGNOSIS — I639 Cerebral infarction, unspecified: Secondary | ICD-10-CM

## 2020-03-19 LAB — CUP PACEART REMOTE DEVICE CHECK
Date Time Interrogation Session: 20220116000822
Implantable Pulse Generator Implant Date: 20191104

## 2020-03-22 ENCOUNTER — Other Ambulatory Visit: Payer: Self-pay | Admitting: Internal Medicine

## 2020-03-27 ENCOUNTER — Telehealth: Payer: Self-pay | Admitting: Podiatry

## 2020-03-27 NOTE — Telephone Encounter (Signed)
Patient came into the office this morning, stated he was told at previous visit there would be follow up appointment scheduled for 03/27/20 at 1015. Upon arrival he is told he doesn't have appointment scheduled and asked to return in February 18,22 at 730 am. Patients wife was actually scheduled in patients slot instead by mistake and the patient asked if the appointment could be switched at front desk and was told no. Patient then had wife call in on behalf to make formal compliant and stated this incident is the exact reason why she started going into the [facility] office cause they take their time, show courtesy, very polite and doesn't rush so it worth the extra 20 min drive. I did however get the patient rescheduled for 04/02/20 at 845 am

## 2020-04-01 NOTE — Progress Notes (Signed)
Carelink Summary Report / Loop Recorder 

## 2020-04-02 ENCOUNTER — Ambulatory Visit: Payer: Medicare Other | Admitting: Podiatry

## 2020-04-02 ENCOUNTER — Encounter: Payer: Self-pay | Admitting: Podiatry

## 2020-04-02 ENCOUNTER — Other Ambulatory Visit: Payer: Self-pay

## 2020-04-02 DIAGNOSIS — M79675 Pain in left toe(s): Secondary | ICD-10-CM | POA: Diagnosis not present

## 2020-04-02 DIAGNOSIS — B351 Tinea unguium: Secondary | ICD-10-CM

## 2020-04-02 DIAGNOSIS — L84 Corns and callosities: Secondary | ICD-10-CM

## 2020-04-02 DIAGNOSIS — I739 Peripheral vascular disease, unspecified: Secondary | ICD-10-CM | POA: Diagnosis not present

## 2020-04-02 DIAGNOSIS — M79674 Pain in right toe(s): Secondary | ICD-10-CM | POA: Diagnosis not present

## 2020-04-02 NOTE — Progress Notes (Signed)
Subjective:  Patient ID: [name], male    DOB: Jun 08, 1940,  MRN: [id number]  [name] presents to clinic today for at risk foot care with h/o clotting disorder and callus(es) b/l and painful thick toenails that are difficult to trim. Painful toenails interfere with ambulation. Aggravating factors include wearing enclosed shoe gear. Pain is relieved with periodic professional debridement. Painful calluses are aggravated when weightbearing with and without shoegear. Pain is relieved with periodic professional debridement.   PCP is Dr. Kevan Ny.  He voices no new pedal problems on today's visit.  Review of Systems: Negative except as noted in the HPI. Past Medical History:  Diagnosis Date  . CAD (coronary artery disease)    s/p cath in 2005 and CABG x4  . Cataract   . DJD (degenerative joint disease)   . DJD (degenerative joint disease)   . Fluid retention    mild  . H/O: gout   . History of blood clots   . HTN (hypertension)   . Hyperlipidemia   . Hyperlipidemia   . Myocardial infarction ([place]) 2009  . Obesity    with reduction  . OSA (obstructive sleep apnea)    AHI 98/hr  . Renal insufficiency   . Sleep apnea   . Stroke [facility]) 2000   Past Surgical History:  Procedure Laterality Date  . CORONARY ARTERY BYPASS GRAFT  2005   LIMA to LAD, SVG to OM1 and OM 2, RCA.   [name] left inguinal hernia repair    . LOOP RECORDER INSERTION N/A 01/03/2018   Procedure: LOOP RECORDER INSERTION;  Surgeon: Evans Lance, MD;  Location: [place] [facility];  Service: Cardiovascular;  Laterality: N/A;  . TEE WITHOUT CARDIOVERSION N/A 12/11/2016   Procedure: TRANSESOPHAGEAL ECHOCARDIOGRAM (TEE);  Surgeon: [name], MD;  Location: [facility] ENDOSCOPY;  Service: Cardiovascular;  Laterality: N/A;  . TEE WITHOUT CARDIOVERSION N/A 01/03/2018   Procedure: TRANSESOPHAGEAL ECHOCARDIOGRAM (TEE);  Surgeon: Dorothy Spark, MD;  [facility];  Service: Cardiovascular;  Laterality: N/A;     Current Outpatient Medications:  .  tamsulosin (FLOMAX) 0.4 MG CAPS capsule, Take 1 capsule by mouth at bedtime., Disp: , Rfl:  .  acetaminophen (TYLENOL) 325 MG tablet, Take 1-2 tablets (325-650 mg total) by mouth every 4 (four) hours as needed for mild pain., Disp: , Rfl:  .  allopurinol (ZYLOPRIM) 100 MG tablet, Take 200 mg by mouth daily. , Disp: , Rfl:  .  amLODipine (NORVASC) 5 MG tablet, Take 1 tablet by mouth once daily, Disp: 90 tablet, Rfl: 3 .  ciprofloxacin (CIPRO) 500 MG tablet, Take 500 mg by mouth 2 (two) times daily., Disp: , Rfl:  .  ELIQUIS 5 MG TABS tablet, Take 1 tablet by mouth twice daily, Disp: 180 tablet, Rfl: 1 .  furosemide (LASIX) 20 MG tablet, Take 1/2 (one-half) tablet by mouth once daily, Disp: 30 tablet, Rfl: 0 .  FUROSEMIDE PO, , Disp: , Rfl:  .  Menthol-Methyl Salicylate (MUSCLE RUB) 10-15 % CREA, Apply 1 application topically 4 (four) times daily -  with meals and at bedtime. To neck and shoulders, Disp: , Rfl: 0 .  metoprolol tartrate (LOPRESSOR) 25 MG tablet, Take 1/2 (one-half) tablet by mouth twice daily, Disp: 60 tablet, Rfl: 3 .  montelukast (SINGULAIR) 10 MG tablet, Take 10 mg by mouth at bedtime., Disp: , Rfl:  .  niacin (NIASPAN) 1000 MG CR tablet, , Disp: , Rfl:  .  niacin (NIASPAN) 500 MG CR  tablet, Take 500 mg by mouth at bedtime.  , Disp: , Rfl:  .  Olmesartan Medoxomil (BENICAR PO), , Disp: , Rfl:  .  ondansetron (ZOFRAN ODT) 8 MG disintegrating tablet, Take 1 tablet (8 mg total) by mouth every 8 (eight) hours as needed for nausea., Disp: 20 tablet, Rfl: 0 .  pantoprazole (PROTONIX) 40 MG tablet, Take 1 tablet (40 mg total) by mouth daily., Disp: 30 tablet, Rfl: 0 .  polyethylene glycol (MIRALAX / GLYCOLAX) packet, Take 17 g by mouth daily. (Patient taking differently: Take 17 g by mouth daily as needed for mild constipation. ), Disp: 60 each, Rfl: 0 .  potassium chloride (KLOR-CON) 10 MEQ tablet, Take 10 mEq by mouth daily., Disp: , Rfl:  .   rosuvastatin (CRESTOR) 10 MG tablet, Take 10 mg by mouth at bedtime., Disp: , Rfl:  Allergies  Allergen Reactions  . Ramipril Cough   Social History   Occupational History  . Not on file  Tobacco Use  . Smoking status: Former Smoker    Types: Cigars  . Smokeless tobacco: Never Used  . Tobacco comment: used to smoke cigars, 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, none since  Vaping Use  . Vaping Use: Never used  Substance and Sexual Activity  . Alcohol use: No  . Drug use: No  . Sexual activity: Not on file    Objective:   Constitutional [name] is a pleasant 80 y.o. African American male, in NAD. AAO x 3.   Vascular Capillary fill time to digits <3 seconds b/l lower extremities. Palpable DP pulse(s) b/l lower extremities Nonpalpable PT pulse(s) b/l lower extremities. Pedal hair absent. Lower extremity skin temperature gradient within normal limits. Trace edema noted b/l lower extremities. Varicosities present b/l. No ischemia or gangrene noted b/l lower extremities. No cyanosis or clubbing noted.  Neurologic Normal speech. Oriented to person, place, and time. Protective sensation intact 5/5 intact bilaterally with 10g monofilament b/l. Vibratory sensation intact b/l.  Dermatologic Pedal skin with normal turgor, texture and tone bilaterally. No open wounds bilaterally. No interdigital macerations bilaterally. Toenails 1-5 b/l elongated, discolored, dystrophic, thickened, crumbly with subungual debris and tenderness to dorsal palpation. Hyperkeratotic lesion(s) b/l hallux IPJ and distal tip right 3rd digit. No erythema, no edema, no drainage, no fluctuance.  Orthopedic: Normal muscle strength 5/5 to all lower extremity muscle groups bilaterally. No pain crepitus or joint limitation noted with ROM b/l. Pes planus deformity noted b/l.    Radiographs: None Assessment:   1. Pain due to onychomycosis of toenails of both feet   2. Corns and callosities   3. PAD (peripheral artery disease)  ([place])    Plan:  Patient was evaluated and treated and all questions answered.  Onychomycosis with pain -Nails palliatively debridement as below -Educated on self-care  Procedure: Nail Debridement Rationale: Pain Type of Debridement: manual, sharp debridement. Instrumentation: Nail nipper, rotary burr. Number of Nails: 10 -Examined patient. -Patient to continue soft, supportive shoe gear daily. -Toenails 1-5 b/l were debrided in length and girth with sterile nail nippers and dremel without iatrogenic bleeding.  -Corn(s) R 3rd toe and callus(es) L hallux and R hallux were pared utilizing sterile scalpel blade without incident. Total number debrided =3. -Patient to report any pedal injuries to medical professional immediately. -Patient/POA to call should there be question/concern in the interim.  Return in about 3 months (around 06/30/2020).  Marzetta Board, DPM

## 2020-04-16 ENCOUNTER — Encounter: Payer: Self-pay | Admitting: Physician Assistant

## 2020-04-16 ENCOUNTER — Ambulatory Visit (INDEPENDENT_AMBULATORY_CARE_PROVIDER_SITE_OTHER): Payer: Medicare Other | Admitting: Physician Assistant

## 2020-04-16 ENCOUNTER — Other Ambulatory Visit: Payer: Self-pay

## 2020-04-16 VITALS — BP 124/80 | HR 67 | Temp 97.9°F | Resp 16 | Ht 71.0 in | Wt 250.0 lb

## 2020-04-16 DIAGNOSIS — R531 Weakness: Secondary | ICD-10-CM | POA: Diagnosis not present

## 2020-04-16 DIAGNOSIS — Z8673 Personal history of transient ischemic attack (TIA), and cerebral infarction without residual deficits: Secondary | ICD-10-CM | POA: Diagnosis not present

## 2020-04-16 DIAGNOSIS — N183 Chronic kidney disease, stage 3 unspecified: Secondary | ICD-10-CM

## 2020-04-16 DIAGNOSIS — D689 Coagulation defect, unspecified: Secondary | ICD-10-CM

## 2020-04-16 DIAGNOSIS — I25709 Atherosclerosis of coronary artery bypass graft(s), unspecified, with unspecified angina pectoris: Secondary | ICD-10-CM

## 2020-04-16 NOTE — Progress Notes (Addendum)
New Patient Office Visit  Subjective:  Patient ID: [name], male    DOB: 05-22-1940  Age: 80 y.o. MRN: [id number]  CC:  Chief Complaint  Patient presents with  . Establish Care    Previous PCP was [facility]iss [facility]  . Neck Pain    Started after his stroke 3 years ago. When he yawns will have tension/stiffness of neck.   . Weight Loss    Having weight loss since his stroke. Decreased to no appetite. Can go all day without eating.     HPI [name] presents for new patient establishment. He is here with his wife. Married 55 years. They have three children and two granddaughters. Pt was seeing Dr. Marlou Sa previously and states he just had an appointment a few weeks ago with labs (record release signed yesterday by wife).  Hx of CAD S/P CABG IN 2004, HTN, dyslipidemia, gout, and CVA (2019). Follows with cardiology, podiatry (onychomycosis), and neurorehab.   Patient's wife is concerned about his strength and energy, as he has had decreased appetite since the CVA. Notices he is having more difficulty with stairs.  Past Medical History:  Diagnosis Date  . CAD (coronary artery disease)    s/p cath in 2005 and CABG x4  . Cataract   . DJD (degenerative joint disease)   . DJD (degenerative joint disease)   . Fluid retention    mild  . H/O: gout   . History of blood clots   . HTN (hypertension)   . Hyperlipidemia   . Hyperlipidemia   . Myocardial infarction ([place]) 2009  . Obesity    with reduction  . OSA (obstructive sleep apnea)    AHI 98/hr  . Renal insufficiency   . Sleep apnea   . Stroke [facility]) 2019    Past Surgical History:  Procedure Laterality Date  . CORONARY ARTERY BYPASS GRAFT  2005   LIMA to LAD, SVG to OM1 and OM 2, RCA.   [name] left inguinal hernia repair    . LOOP RECORDER INSERTION N/A 01/03/2018   Procedure: LOOP RECORDER INSERTION;  Surgeon: Evans Lance, MD;  Location: [place] [facility];  Service: Cardiovascular;  Laterality: N/A;  . TEE  WITHOUT CARDIOVERSION N/A 12/11/2016   Procedure: TRANSESOPHAGEAL ECHOCARDIOGRAM (TEE);  Surgeon: [name], MD;  Location: [facility] ENDOSCOPY;  Service: Cardiovascular;  Laterality: N/A;  . TEE WITHOUT CARDIOVERSION N/A 01/03/2018   Procedure: TRANSESOPHAGEAL ECHOCARDIOGRAM (TEE);  Surgeon: Dorothy Spark, MD;  Location: [facility] ENDOSCOPY;  Service: Cardiovascular;  Laterality: N/A;    Family History  Problem Relation Age of Onset  . Gout Mother   . Stroke Mother   . Coronary artery disease Mother   . Hypertension Mother   . Arthritis Mother   . Coronary artery disease Father   . Arthritis Father   . Gout Father   . Stroke Father   . Hypertension Father   . Coronary artery disease Brother   . Arthritis Brother   . Gout Brother   . Stroke Brother   . Hypertension Brother   . Coronary artery disease Brother   . Arthritis Brother   . Gout Brother   . Stroke Brother   . Hypertension Brother   . Coronary artery disease Other        significant in multiple family members    Social History   Socioeconomic History  . Marital status: Married    Spouse name: [name]  . Number of  children: Not on file  . Years of education: Not on file  . Highest education level: Not on file  Occupational History  . Not on file  Tobacco Use  . Smoking status: Former Smoker    Types: Cigars  . Smokeless tobacco: Never Used  . Tobacco comment: used to smoke cigars, 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, none since  Vaping Use  . Vaping Use: Never used  Substance and Sexual Activity  . Alcohol use: No  . Drug use: No  . Sexual activity: Not on file  Other Topics Concern  . Not on file  Social History Narrative  . Not on file   Social Determinants of Health   Financial Resource Strain: Not on file  Food Insecurity: Not on file  Transportation Needs: Not on file  Physical Activity: Not on file  Stress: Not on file  Social Connections: Not on file  Intimate Partner Violence: Not on file     ROS Review of Systems  Constitutional: Positive for appetite change (since CVA ) and unexpected weight change (gradual since CVA 3 years ago). Negative for activity change.  HENT: Negative for congestion.   Eyes: Negative for visual disturbance.  Respiratory: Negative for apnea and shortness of breath.   Cardiovascular: Negative for chest pain.  Gastrointestinal: Negative for abdominal pain and blood in stool.  Endocrine: Negative for polydipsia, polyphagia and polyuria.  Genitourinary: Negative for difficulty urinating.  Musculoskeletal: Positive for neck pain (STATES DR. DEAN HAS DONE WORK-UP). Negative for arthralgias.  Skin: Negative for rash.  Neurological: Positive for weakness (HARDER FOR HIM TO GO UPSTAIRS). Negative for seizures and headaches.  Psychiatric/Behavioral: Negative for sleep disturbance and suicidal ideas.    Objective:   Today's Vitals: BP 124/80   Pulse 67   Temp 97.9 F (36.6 C) (Temporal)   Resp 16   Ht '5\' 11"'$  (1.803 m)   Wt 250 lb (113.4 kg)   SpO2 98%   BMI 34.87 kg/m     Physical Exam Vitals and nursing note reviewed.  Constitutional:      General: He is not in acute distress.    Appearance: Normal appearance. He is not toxic-appearing.  HENT:     Head: Normocephalic and atraumatic.     Right Ear: Tympanic membrane, ear canal and external ear normal.     Left Ear: Tympanic membrane, ear canal and external ear normal.     Nose: Nose normal.     Mouth/Throat:     Mouth: Mucous membranes are moist.     Pharynx: Oropharynx is clear.  Eyes:     Extraocular Movements: Extraocular movements intact.     Conjunctiva/sclera: Conjunctivae normal.     Pupils: Pupils are equal, round, and reactive to light.  Cardiovascular:     Rate and Rhythm: Normal rate and regular rhythm.     Pulses: Normal pulses.     Heart sounds: Normal heart sounds.  Pulmonary:     Effort: Pulmonary effort is normal.     Breath sounds: Normal breath sounds.   Abdominal:     General: Abdomen is flat. Bowel sounds are normal.     Palpations: Abdomen is soft.     Tenderness: There is no abdominal tenderness.  Musculoskeletal:        General: Normal range of motion.     Cervical back: Normal range of motion and neck supple.  Skin:    General: Skin is warm and dry.  Neurological:  General: No focal deficit present.     Mental Status: He is alert and oriented to person, place, and time.     Cranial Nerves: No cranial nerve deficit.     Motor: No weakness.     Gait: Gait abnormal (slow, cautious).  Psychiatric:        Mood and Affect: Mood normal.        Behavior: Behavior normal.     Assessment & Plan:   Problem List Items Addressed This Visit      Digestive   Dysphagia, post-stroke     Nervous and Auditory   Left-sided weakness     Genitourinary   CKD (chronic kidney disease), stage III (HCC)     Hematopoietic and Hemostatic   Coagulation defect ([place])     Other   Generalized weakness   History of CVA (cerebrovascular accident) - Primary      Outpatient Encounter Medications as of 04/16/2020  Medication Sig  . acetaminophen (TYLENOL) 325 MG tablet Take 1-2 tablets (325-650 mg total) by mouth every 4 (four) hours as needed for mild pain.  [name] allopurinol (ZYLOPRIM) 100 MG tablet Take 200 mg by mouth daily.   [name] amLODipine (NORVASC) 5 MG tablet Take 1 tablet by mouth once daily  . ELIQUIS 5 MG TABS tablet Take 1 tablet by mouth twice daily  . furosemide (LASIX) 20 MG tablet Take 1/2 (one-half) tablet by mouth once daily  . Menthol-Methyl Salicylate (MUSCLE RUB) 10-15 % CREA Apply 1 application topically 4 (four) times daily -  with meals and at bedtime. To neck and shoulders  . metoprolol tartrate (LOPRESSOR) 25 MG tablet Take 1/2 (one-half) tablet by mouth twice daily  . montelukast (SINGULAIR) 10 MG tablet Take 10 mg by mouth at bedtime.  . niacin (NIASPAN) 1000 MG CR tablet   . niacin (NIASPAN) 500 MG CR tablet Take 500  mg by mouth at bedtime.  . Olmesartan Medoxomil (BENICAR PO)   . potassium chloride (KLOR-CON) 10 MEQ tablet Take 10 mEq by mouth daily.  . tamsulosin (FLOMAX) 0.4 MG CAPS capsule Take 1 capsule by mouth at bedtime.  . [DISCONTINUED] ciprofloxacin (CIPRO) 500 MG tablet Take 500 mg by mouth 2 (two) times daily.  . [DISCONTINUED] FUROSEMIDE PO   . [DISCONTINUED] ondansetron (ZOFRAN ODT) 8 MG disintegrating tablet Take 1 tablet (8 mg total) by mouth every 8 (eight) hours as needed for nausea.  . [DISCONTINUED] pantoprazole (PROTONIX) 40 MG tablet Take 1 tablet (40 mg total) by mouth daily.  . rosuvastatin (CRESTOR) 10 MG tablet Take 10 mg by mouth at bedtime. (Patient not taking: Reported on 04/16/2020)  . [DISCONTINUED] polyethylene glycol (MIRALAX / GLYCOLAX) packet Take 17 g by mouth daily. (Patient not taking: Reported on 04/16/2020)   No facility-administered encounter medications on file as of 04/16/2020.    Follow-up: Return in about 6 months (around 10/14/2020) for med check.   1. History of CVA (cerebrovascular accident) Overall has seemed to recover well.  Still has some residual weakness.  He may benefit from further physical therapy for strength improvement. Will send referral.  2. Generalized weakness Secondary to #1.   3. Stage 3 chronic kidney disease, unspecified whether stage 3a or 3b CKD ([place]) Need records to review   4. Coagulation defect ([place]) Need records for review   5. CAD Follows with cardiology  **Today there was some confusion between patient and his wife whether they are truly transferring care to our office or not. Encouraged them to  make a final decision on this and let us know, as he would not be able to see both myself and Dr. Marlou Sa. They are understanding and agreeable.  Total time spent with patient and wife obtaining history, as well as chart review was 35 minutes.  This visit occurred during the SARS-CoV-2 public health emergency.  Safety protocols were  in place, including screening questions prior to the visit, additional usage of staff PPE, and extensive cleaning of exam room while observing appropriate contact time as indicated for disinfecting solutions.     Kanitra Purifoy M Ozetta Flatley, PA-C

## 2020-04-16 NOTE — Patient Instructions (Signed)
Please schedule for 6 month f/up with me. I will need to review recent records from Dr. Marlou Sa. Someone will call to schedule Physical Therapy.     Understanding Your Risk for Falls Each year, millions of people have serious injuries from falls. It is important to understand your risk for falling. Talk with your health care provider about your risk and what you can do to lower it. There are actions you can take at home to lower your risk and prevent falls. If you do have a serious fall, make sure to tell your health care provider. Falling once raises your risk of falling again. How can falls affect me? Serious injuries from falls are common. These include:  Broken bones, such as hip fractures.  Head injuries, such as traumatic brain injuries (TBI) or concussion. A fear of falling can cause you to avoid activities and stay at home. This can make your muscles weaker and actually raise your risk for a fall. What can increase my risk? There are a number of risk factors that increase your risk for falling. The more risk factors you have, the higher your risk of falling. Serious injuries from a fall happen most often to people older than age 58. Children and young adults ages 71-29 are also at higher risk. Common risk factors include:  Weakness in the lower body.  Lack (deficiency) of vitamin D.  Being generally weak or confused due to long-term (chronic) illness.  Dizziness or balance problems.  Poor vision.  Medicines that cause dizziness or drowsiness. These can include medicines for your blood pressure, heart, anxiety, insomnia, or edema, as well as pain medicines and muscle relaxants. Other risk factors include:  Drinking alcohol.  Having had a fall in the past.  Having depression.  Having foot pain or wearing improper footwear.  Working at a dangerous job.  Having any of the following in your home: ? Tripping hazards, such as floor clutter or loose rugs. ? Poor  lighting. ? Pets.  Having dementia or memory loss. What actions can I take to lower my risk of falling? Physical activity Maintain physical fitness. Do strength and balance exercises. Consider taking a regular class to build strength and balance. Yoga and tai chi are good options. Vision Have your eyes checked every year and your vision prescription updated as needed. Walking aids and footwear  Wear nonskid shoes. Do not wear high heels.  Do not walk around the house in socks or slippers.  Use a cane or walker as told by your health care provider. Home safety  Attach secure railings on both sides of your stairs.  Install grab bars for your tub, shower, and toilet. Use a bath mat in your tub or shower.  Use good lighting in all rooms. Keep a flashlight near your bed.  Make sure there is a clear path from your bed to the bathroom. Use night-lights.  Do not use throw rugs. Make sure all carpeting is taped or tacked down securely.  Remove all clutter from walkways and stairways, including extension cords.  Repair uneven or broken steps.  Avoid walking on icy or slippery surfaces. Walk on the grass instead of on icy or slick sidewalks. Use ice melt to get rid of ice on walkways.  Use a cordless phone.      Questions to ask your health care provider  Can you help me check my risk for a fall?  Do any of my medicines make me more likely to fall?  Should I take a vitamin D supplement?  What exercises can I do to improve my strength and balance?  Should I make an appointment to have my vision checked?  Do I need a bone density test to check for weak bones or osteoporosis?  Would it help to use a cane or a walker? Where to find more information  Centers for Disease Control and Prevention, [employer]: www.cdc.gov  Community-Based Fall Prevention Programs: www.cdc.gov  National Institute on Aging: www.nia.nih.gov Contact a health care provider if:  You fall at home.  You  are afraid of falling at home.  You feel weak, drowsy, or dizzy. Summary  Serious injuries from a fall happen most often to people older than age 65. Children and young adults ages 15-29 are also at higher risk.  Talk with your health care provider about your risks for falling and how to lower those risks.  Taking certain precautions at home can lower your risk for falling.  If you fall, always tell your health care provider. This information is not intended to replace advice given to you by your health care provider. Make sure you discuss any questions you have with your health care provider. Document Revised: 09/20/2019 Document Reviewed: 09/20/2019 Elsevier Patient Education  2021 Elsevier Inc.  

## 2020-04-16 NOTE — Progress Notes (Signed)
5

## 2020-04-18 ENCOUNTER — Other Ambulatory Visit: Payer: Self-pay | Admitting: Internal Medicine

## 2020-04-19 ENCOUNTER — Ambulatory Visit: Payer: Medicare Other | Admitting: Podiatry

## 2020-04-22 ENCOUNTER — Ambulatory Visit (INDEPENDENT_AMBULATORY_CARE_PROVIDER_SITE_OTHER): Payer: Medicare Other

## 2020-04-22 DIAGNOSIS — I639 Cerebral infarction, unspecified: Secondary | ICD-10-CM

## 2020-04-22 LAB — CUP PACEART REMOTE DEVICE CHECK
Date Time Interrogation Session: 20220218002802
Implantable Pulse Generator Implant Date: 20191104

## 2020-04-23 ENCOUNTER — Other Ambulatory Visit: Payer: Self-pay

## 2020-04-23 ENCOUNTER — Encounter: Payer: Self-pay | Admitting: Physical Therapy

## 2020-04-23 ENCOUNTER — Ambulatory Visit: Payer: Medicare Other | Admitting: Physical Therapy

## 2020-04-23 VITALS — BP 136/80 | HR 54

## 2020-04-23 DIAGNOSIS — M542 Cervicalgia: Secondary | ICD-10-CM | POA: Diagnosis not present

## 2020-04-23 DIAGNOSIS — R531 Weakness: Secondary | ICD-10-CM

## 2020-04-23 NOTE — Patient Instructions (Signed)
Access Code: [email or web address] URL: [email or web address] Date: 04/23/2020 Prepared by: Daleen Bo  Exercises Supine Bridge - 1 x daily - 7 x weekly - 3 sets - 10 reps Seated Shoulder Rolls - 1 x daily - 7 x weekly - 3 sets - 10 reps Seated Upper Trapezius Stretch - 2 x daily - 7 x weekly - 1 sets - 3 reps - 30 hold

## 2020-04-23 NOTE — Therapy (Signed)
[facility] [address] [place], [place], [address] Phone: [phone]   Fax:  [phone]  Physical Therapy Evaluation  Patient Details  Name: [name] MRN: [id number] Date of Birth: 10/29/1940 No data recorded  Encounter Date: 04/23/2020   PT End of Session - 04/23/20 1506    Visit Number 1    Number of Visits 17    Date for PT Re-Evaluation 05/23/20    Authorization Type [employer]    PT Start Time [id number]    PT Stop Time [id number]    PT Time Calculation (min) 49 min    Activity Tolerance Patient tolerated treatment well;No increased pain    Behavior During Therapy WFL for tasks assessed/performed           Past Medical History:  Diagnosis Date  . CAD (coronary artery disease)    s/p cath in 2005 and CABG x4  . Cataract   . DJD (degenerative joint disease)   . DJD (degenerative joint disease)   . Fluid retention    mild  . H/O: gout   . History of blood clots   . HTN (hypertension)   . Hyperlipidemia   . Hyperlipidemia   . Myocardial infarction ([place]) 2009  . Obesity    with reduction  . OSA (obstructive sleep apnea)    AHI 98/hr  . Renal insufficiency   . Sleep apnea   . Stroke [facility]) 2019    Past Surgical History:  Procedure Laterality Date  . CORONARY ARTERY BYPASS GRAFT  2005   LIMA to LAD, SVG to OM1 and OM 2, RCA.   [name] left inguinal hernia repair    . LOOP RECORDER INSERTION N/A 01/03/2018   Procedure: LOOP RECORDER INSERTION;  Surgeon: Evans Lance, MD;  Location: [facility];  Service: Cardiovascular;  Laterality: N/A;  . TEE WITHOUT CARDIOVERSION N/A 12/11/2016   Procedure: TRANSESOPHAGEAL ECHOCARDIOGRAM (TEE);  Surgeon: [name], MD;  Location: [facility] ENDOSCOPY;  Service: Cardiovascular;  Laterality: N/A;  . TEE WITHOUT CARDIOVERSION N/A 01/03/2018   Procedure: TRANSESOPHAGEAL ECHOCARDIOGRAM (TEE);  Surgeon: Dorothy Spark, MD;  Location: [facility] ENDOSCOPY;  Service:  Cardiovascular;  Laterality: N/A;    Vitals:   04/23/20 1407  BP: 136/80  Pulse: (!) 54  SpO2: 98%      Subjective Assessment - 04/23/20 1352    Subjective Pt states that after the stroke he started having neck pain. The pain only occurs when he yawns and the neck feels very stiff. Pain occurs about once a day. Pt states he would like to focus on the neck pain and his ability to get off the floor. Pt denies NT. Pt denies trouble chewing or previous TMJ issues. Pt states the neck feels like it will lock up on him at time.  Pt states the floor transfers first became an issue after the stroke when he realized he needed help to stand back up. Pt states he does not have trouble with stairs. He is just very careful while going up and down. He states he does note LE weakness that he thinks is limiting is ability to transfer. Pt states he thinks it is the strength that causes his biggest limitation. Pt denies visual field deficits or residual deficits perception deficits from the stroke. Pt states he sleeps with a CPAP at night but it does not affect his neck position. Pt states there is no radiating pain. Pt denies dizziness, headaches, and double vision.  Limitations Reading;Other (comment);House hold activities   Turning head while driving   Patient Stated Goals Pt states he would like to reduce his neck pain and be able to get off the floor easier.    Currently in Pain? Yes    Pain Score 3     Pain Location Neck    Pain Orientation Left    Pain Descriptors / Indicators Aching;Sharp    Pain Type Chronic pain    Pain Radiating Towards No radiating pain    Pain Frequency Intermittent    Aggravating Factors  yawning, turning head, looking over shoulder    Pain Relieving Factors resting    Effect of Pain on Daily Activities Difficulty with looking, turning, pain with yawning              [facility] PT Assessment - 04/23/20 0001      Assessment   Medical Diagnosis Neck pain and LE weakness     Hand Dominance Right    Prior Therapy HH, OT, PT post CVA      Precautions   Precautions None      Restrictions   Weight Bearing Restrictions No      Balance Screen   Has the patient fallen in the past 6 months No    Has the patient had a decrease in activity level because of a fear of falling?  No    Is the patient reluctant to leave their home because of a fear of falling?  No      Prior Function   Level of Independence Independent    Leisure bowling, antiquing      Cognition   Overall Cognitive Status Within Functional Limits for tasks assessed      Observation/Other Assessments   Other Surveys  Neck Disability Index;Lower Extremity Functional Scale    Neck Disability Index  15% impairment    Lower Extremity Functional Scale  56% function      Posture/Postural Control   Posture/Postural Control No significant limitations    Postural Limitations Rounded Shoulders;Forward head      ROM / Strength   AROM / PROM / Strength AROM;Strength;PROM      AROM   Overall AROM Comments C/S AROM: most limited in bilateral SB and rotation. Pain with L SB and L rotation. >50% deficit      PROM   Overall PROM  Deficits    Overall PROM Comments C/S PROM: stiffness in rotation, flexion and extension, pain at end range ~40% deficit      Strength   Overall Strength Deficits    Overall Strength Comments 4/5 throughout L and R LE, 5/5 with R knee exenstion      Palpation   Palpation comment hypertonicity of L UT, bilat C/S paraspinals, decreased joint mobility in extension with PA glides      Transfers   Five time sit to stand comments  13.2s, eccentric lowering control deficit      Ambulation/Gait   Gait Pattern Decreased arm swing - left;Decreased step length - right;Decreased stride length;Decreased hip/knee flexion - right;Decreased hip/knee flexion - left;Lateral trunk lean to right;Wide base of support                      Objective measurements completed on  examination: See above findings.       [facility] Adult PT Treatment/Exercise - 04/23/20 0001      Exercises   Exercises Knee/Hip;Neck      Neck Exercises: Seated  Shoulder Rolls 20 reps;Forwards;Backwards    Other Seated Exercise UT stretch 30s 3x bilat      Knee/Hip Exercises: Supine   Bridges 2 sets;10 reps                  PT Education - 04/23/20 1506    Education Details MOI, diagnosis, prognosis, anatomy, HEP, POC    Person(s) Educated Patient    Methods Explanation;Demonstration;Handout    Comprehension Verbalized understanding;Returned demonstration            PT Short Term Goals - 04/23/20 1511      PT SHORT TERM GOAL #1   Title Pt will demonstrate independence with HEP in order to demonstrate synthesis of HEP, compliance, and PT education.    Time 3    Period Weeks    Status New    Target Date 05/14/20      PT SHORT TERM GOAL #2   Title Pt to demonstrate improvement of 5xSTS to be below 12 seconds in order tomeet stroke population cut off score and  demonstrate functional improvement in LE strength.    Time 4    Period Weeks    Status New             PT Long Term Goals - 04/23/20 1514      PT LONG TERM GOAL #1   Title Pt will demonstrate ability to perform stairs with reciprocal pattern and single UE support in order to demonstrate functional improvement with stair mobility.    Time 8    Period Weeks    Status New      PT LONG TERM GOAL #2   Title Pt will demonstrate full floor to stand transfer with solid surface and UE support in order to demonstrate safety and functional improvement in transfers for full independence at home.    Time 8    Period Weeks    Status New                  Plan - 04/23/20 1507    Clinical Impression Statement Pt is a 80 y.o. male presenting to PT eval today with CC of L sided neck pain and difficulty with transfers. Pt presents with decreased cervical ROM, increased cervical joint stiffness, muscle  hypertonicity and guarding, as well as LE weakness. Pt's s/s are consistent with cervicalgia and pt's difficulty with transfers is likely a residual effect of discontinuing rehab during the pandemic. Pt's impairments limit ability to perform daily activities without pain and transfer as needed independently. Pt would benefit from continued skilled therapy in order to reduce neck pain and maximize mobility and strength for prevention of further functional decline.    Personal Factors and Comorbidities Comorbidity 1;Fitness;Comorbidity 2;Age    Examination-Activity Limitations Stairs;Squat;Lift;Stand;Transfers    Examination-Participation Restrictions Community Activity;Other;Driving;Cleaning    Stability/Clinical Decision Making Stable/Uncomplicated    Clinical Decision Making Low    Rehab Potential Good    PT Frequency 2x / week    PT Duration 8 weeks    PT Treatment/Interventions ADLs/Self Care Home Management;Iontophoresis '4mg'$ /ml Dexamethasone;Moist Heat;Electrical Stimulation;Ultrasound;Gait training;Stair training;Functional mobility training;Therapeutic activities;Therapeutic exercise;Balance training;Neuromuscular re-education;Patient/family education;Manual techniques;Passive range of motion;Dry needling;Taping;Spinal Manipulations;Joint Manipulations    PT Next Visit Plan review HEP, trial chin tucks again, supine cervical rotation, joint mobs, STS    PT Home Exercise Plan printout provided for home    Consulted and Agree with Plan of Care Patient           Patient  will benefit from skilled therapeutic intervention in order to improve the following deficits and impairments:  Abnormal gait,Decreased balance,Decreased mobility,Decreased endurance,Difficulty walking,Decreased range of motion,Pain,Decreased activity tolerance,Decreased strength,Impaired flexibility  Visit Diagnosis: Generalized weakness  Cervicalgia     Problem List Patient Active Problem List   Diagnosis Date Noted   . 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 Abnormal peripheral vision, left 01/21/2018  . Anemia, chronic disease   . Left hemiparesis ([place])   . Acute ischemic cerebrovascular accident (CVA) involving right middle cerebral artery territory [facility])   . CKD (chronic kidney disease), stage III ([place])   . Peripheral edema   . Stroke (cerebrum) ([place]) 01/05/2018  . History of gout   . AKI (acute kidney injury) ([place])   . Stage 3 chronic kidney disease ([place])   . Anemia of chronic disease   . Left-sided weakness   . Osteoarthritis   . OSA (obstructive sleep apnea)   . Noncompliance with CPAP treatment   . History of DVT (deep vein thrombosis)   . History of CVA (cerebrovascular accident)   . Dysphagia, post-stroke   . Sinus tachycardia   . Acute blood loss anemia   . Acute CVA (cerebrovascular accident) ([place]) 01/01/2018  . Hypertensive urgency 01/01/2018  . CKD (chronic kidney disease) stage 2, GFR 60-89 ml/min 01/01/2018  . 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 Generalized weakness   . Sepsis ([place]) 12/10/2016  . Bacteremia due to Streptococcus 12/10/2016  . Lactic acidosis 12/10/2016  . Elevated troponin 12/10/2016  . Renal insufficiency 12/10/2016  . Hypophosphatemia 12/10/2016  . Left leg swelling 12/10/2016  . Febrile illness 12/09/2016  . Febrile illness, acute   . OBESITY, UNSPECIFIED 05/31/2009  . Hyperlipidemia 05/30/2009  . Obstructive sleep apnea 05/30/2009  . Essential hypertension 05/30/2009  . CAD (coronary artery disease) 05/30/2009    Daleen Bo PT, DPT 04/23/20 5:41 PM   [place], [place], [address] Phone: [phone]   Fax:  [phone]  Name: [name] MRN: [id number] Date of Birth: 12-14-1940

## 2020-04-26 NOTE — Progress Notes (Signed)
Carelink Summary Report / Loop Recorder 

## 2020-04-30 ENCOUNTER — Ambulatory Visit: Payer: Medicare Other | Admitting: Physical Therapy

## 2020-04-30 ENCOUNTER — Other Ambulatory Visit: Payer: Self-pay

## 2020-04-30 ENCOUNTER — Encounter: Payer: Self-pay | Admitting: Physical Therapy

## 2020-04-30 DIAGNOSIS — M542 Cervicalgia: Secondary | ICD-10-CM

## 2020-04-30 DIAGNOSIS — R531 Weakness: Secondary | ICD-10-CM

## 2020-04-30 NOTE — Patient Instructions (Signed)
Access Code: [email or web address] URL: [email or web address] Date: 04/30/2020 Prepared by: Daleen Bo  Exercises Sit to Stand with Hands on Knees - 1 x daily - 7 x weekly - 3 sets - 8 reps Step Up - 1 x daily - 7 x weekly - 2 sets - 10 reps

## 2020-04-30 NOTE — Therapy (Addendum)
[name], [place], [address] Phone: [phone]   Fax:  [phone]  Physical Therapy Treatment  Patient Details  Name: [name] MRN: [id number] Date of Birth: 03/21/1940 No data recorded  Encounter Date: 04/30/2020    Past Medical History:  Diagnosis Date  . CAD (coronary artery disease)    s/p cath in 2005 and CABG x4  . Cataract   . DJD (degenerative joint disease)   . DJD (degenerative joint disease)   . Fluid retention    mild  . H/O: gout   . History of blood clots   . HTN (hypertension)   . Hyperlipidemia   . Hyperlipidemia   . Myocardial infarction ([place]) 2009  . Obesity    with reduction  . OSA (obstructive sleep apnea)    AHI 98/hr  . Renal insufficiency   . Sleep apnea   . Stroke [facility]) 2019    Past Surgical History:  Procedure Laterality Date  . CORONARY ARTERY BYPASS GRAFT  2005   LIMA to LAD, SVG to OM1 and OM 2, RCA.   [name] left inguinal hernia repair    . LOOP RECORDER INSERTION N/A 01/03/2018   Procedure: LOOP RECORDER INSERTION;  Surgeon: Evans Lance, MD;  Location: [facility];  Service: Cardiovascular;  Laterality: N/A;  . TEE WITHOUT CARDIOVERSION N/A 12/11/2016   Procedure: TRANSESOPHAGEAL ECHOCARDIOGRAM (TEE);  Surgeon: [name], MD;  Location: [facility] ENDOSCOPY;  Service: Cardiovascular;  Laterality: N/A;  . TEE WITHOUT CARDIOVERSION N/A 01/03/2018   Procedure: TRANSESOPHAGEAL ECHOCARDIOGRAM (TEE);  Surgeon: Dorothy Spark, MD;  Location: [facility] ENDOSCOPY;  Service: Cardiovascular;  Laterality: N/A;    There were no vitals filed for this visit.                      [facility] Adult PT Treatment/Exercise - 04/30/20 0001      Transfers   Five time sit to stand comments  13.2s, eccentric lowering control deficit      Ambulation/Gait   Gait Pattern Decreased arm swing - left;Decreased step length - right;Decreased stride length;Decreased hip/knee  flexion - right;Decreased hip/knee flexion - left;Lateral trunk lean to right;Wide base of support      Posture/Postural Control   Posture/Postural Control No significant limitations    Postural Limitations Rounded Shoulders;Forward head      Exercises   Exercises Knee/Hip;Neck      Neck Exercises: Seated   Shoulder Rolls 20 reps;Forwards;Backwards    Other Seated Exercise UT stretch 30s 3x bilat      Neck Exercises: Supine   Neck Retraction 10 reps    Neck Retraction Limitations attempted but on hold due to poor form    Other Supine Exercise supine cervical rotation 3s hold 10x each      Knee/Hip Exercises: Standing   Forward Step Up 2 sets;10 reps    Forward Step Up Limitations UE support    Functional Squat 2 sets    Functional Squat Limitations 8 reps, STS with UE support on knees    Other Standing Knee Exercises start stop with CGA 35 ft x2; standing cone tap 3 directions 3x through each leg; cone march 4 cones 4x each leg leading    Other Standing Knee Exercises heel toe raises 20x each   finger support        Manual therapy Grade II-III CPA C2-7, STM bilat UT and L L/S  PT Short Term Goals - 04/23/20 1511      PT SHORT TERM GOAL #1   Title Pt will demonstrate independence with HEP in order to demonstrate synthesis of HEP, compliance, and PT education.    Time 3    Period Weeks    Status New    Target Date 05/14/20      PT SHORT TERM GOAL #2   Title Pt to demonstrate improvement of 5xSTS to be below 12 seconds in order tomeet stroke population cut off score and  demonstrate functional improvement in LE strength.    Time 4    Period Weeks    Status New             PT Long Term Goals - 04/23/20 1514      PT LONG TERM GOAL #1   Title Pt will demonstrate ability to perform stairs with reciprocal pattern and single UE support in order to demonstrate functional improvement with stair mobility.    Time 8    Period Weeks     Status New      PT LONG TERM GOAL #2   Title Pt will demonstrate full floor to stand transfer with solid surface and UE support in order to demonstrate safety and functional improvement in transfers for full independence at home.    Time 8    Period Weeks    Status New                  Patient will benefit from skilled therapeutic intervention in order to improve the following deficits and impairments:  Abnormal gait,Decreased balance,Decreased mobility,Decreased endurance,Difficulty walking,Decreased range of motion,Pain,Decreased activity tolerance,Decreased strength,Impaired flexibility  Visit Diagnosis: Generalized weakness  Cervicalgia     Problem List Patient Active Problem List   Diagnosis Date Noted  . Coagulation defect ([place]) 10/12/2019  . Abnormal peripheral vision, left 01/21/2018  . Anemia, chronic disease   . Left hemiparesis ([place])   . Acute ischemic cerebrovascular accident (CVA) involving right middle cerebral artery territory [facility])   . CKD (chronic kidney disease), stage III ([place])   . Peripheral edema   . Stroke (cerebrum) ([place]) 01/05/2018  . History of gout   . AKI (acute kidney injury) ([place])   . Stage 3 chronic kidney disease ([place])   . Anemia of chronic disease   . Left-sided weakness   . Osteoarthritis   . OSA (obstructive sleep apnea)   . Noncompliance with CPAP treatment   . History of DVT (deep vein thrombosis)   . History of CVA (cerebrovascular accident)   . Dysphagia, post-stroke   . Sinus tachycardia   . Acute blood loss anemia   . Acute CVA (cerebrovascular accident) ([place]) 01/01/2018  . Hypertensive urgency 01/01/2018  . CKD (chronic kidney disease) stage 2, GFR 60-89 ml/min 01/01/2018  . 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 Generalized weakness   . Sepsis ([place]) 12/10/2016  . Bacteremia due to Streptococcus 12/10/2016  . Lactic acidosis 12/10/2016  . Elevated troponin 12/10/2016  . Renal insufficiency  12/10/2016  . Hypophosphatemia 12/10/2016  . Left leg swelling 12/10/2016  . Febrile illness 12/09/2016  . Febrile illness, acute   . OBESITY, UNSPECIFIED 05/31/2009  . Hyperlipidemia 05/30/2009  . Obstructive sleep apnea 05/30/2009  . Essential hypertension 05/30/2009  . CAD (coronary artery disease) 05/30/2009    Daleen Bo PT, DPT 05/02/20 3:44 PM   [place], [place], [address] Phone: [phone]  Fax:  [phone]  Name: DEVANTAE SELLMAN MRN: [id number] Date of Birth: [birth year]

## 2020-05-02 ENCOUNTER — Encounter: Payer: Self-pay | Admitting: Physical Therapy

## 2020-05-02 ENCOUNTER — Other Ambulatory Visit: Payer: Self-pay

## 2020-05-02 ENCOUNTER — Ambulatory Visit: Payer: Medicare Other | Admitting: Physical Therapy

## 2020-05-02 DIAGNOSIS — R531 Weakness: Secondary | ICD-10-CM | POA: Diagnosis not present

## 2020-05-02 DIAGNOSIS — M542 Cervicalgia: Secondary | ICD-10-CM

## 2020-05-02 NOTE — Therapy (Signed)
[facility] [address] [place], [place], [address] Phone: [phone]   Fax:  [phone]  Physical Therapy Treatment  Patient Details  Name: [name] MRN: [id number] Date of Birth: 1940-11-17 No data recorded  Encounter Date: 05/02/2020   PT End of Session - 05/02/20 1546    Visit Number 3    Number of Visits 17    Date for PT Re-Evaluation 05/23/20    Authorization Type [employer]    PT Start Time [id number]    PT Stop Time 1640    PT Time Calculation (min) 47 min    Equipment Utilized During Treatment Gait belt    Activity Tolerance Patient tolerated treatment well;No increased pain    Behavior During Therapy WFL for tasks assessed/performed           Past Medical History:  Diagnosis Date  . CAD (coronary artery disease)    s/p cath in 2005 and CABG x4  . Cataract   . DJD (degenerative joint disease)   . DJD (degenerative joint disease)   . Fluid retention    mild  . H/O: gout   . History of blood clots   . HTN (hypertension)   . Hyperlipidemia   . Hyperlipidemia   . Myocardial infarction ([place]) 2009  . Obesity    with reduction  . OSA (obstructive sleep apnea)    AHI 98/hr  . Renal insufficiency   . Sleep apnea   . Stroke [facility]) 2019    Past Surgical History:  Procedure Laterality Date  . CORONARY ARTERY BYPASS GRAFT  2005   LIMA to LAD, SVG to OM1 and OM 2, RCA.   [name] left inguinal hernia repair    . LOOP RECORDER INSERTION N/A 01/03/2018   Procedure: LOOP RECORDER INSERTION;  Surgeon: Evans Lance, MD;  Location: [facility];  Service: Cardiovascular;  Laterality: N/A;  . TEE WITHOUT CARDIOVERSION N/A 12/11/2016   Procedure: TRANSESOPHAGEAL ECHOCARDIOGRAM (TEE);  Surgeon: [name], MD;  Location: [facility] ENDOSCOPY;  Service: Cardiovascular;  Laterality: N/A;  . TEE WITHOUT CARDIOVERSION N/A 01/03/2018   Procedure: TRANSESOPHAGEAL ECHOCARDIOGRAM (TEE);  Surgeon: Dorothy Spark,  MD;  Location: [facility] ENDOSCOPY;  Service: Cardiovascular;  Laterality: N/A;    There were no vitals filed for this visit.   Subjective Assessment - 05/02/20 1545    Subjective Pt states that the neck is doing much better and he has had no pain. Pt states he was a little sore after last session and felt stiff but it only lasted a day.    Limitations Reading;Other (comment);House hold activities   Turning head while driving   Patient Stated Goals Pt states he would like to reduce his neck pain and be able to get off the floor easier.    Currently in Pain? No/denies    Pain Score 0-No pain    Pain Location Neck    Pain Orientation Left    Pain Descriptors / Indicators Sharp;Tightness    Pain Onset 1 to 4 weeks ago    Pain Frequency Intermittent                             [facility] Adult PT Treatment/Exercise - 05/02/20 0001      Transfers   Five time sit to stand comments  13.2s, eccentric lowering control deficit      Ambulation/Gait   Gait Pattern Decreased arm swing -  left;Decreased step length - right;Decreased stride length;Decreased hip/knee flexion - right;Decreased hip/knee flexion - left;Lateral trunk lean to right;Wide base of support      Posture/Postural Control   Posture/Postural Control No significant limitations    Postural Limitations Rounded Shoulders;Forward head      Exercises   Exercises Knee/Hip;Neck      Neck Exercises: Theraband   Rows Green;10 reps    Rows Limitations 2 sets      Neck Exercises: Standing   Neck Retraction 10 reps      Knee/Hip Exercises: Standing   Forward Step Up 2 sets;10 reps    Forward Step Up Limitations UE support    Functional Squat 5 reps;5 sets    Functional Squat Limitations STS with UE support on knees    Other Standing Knee Exercises standing march at counter 20x; standing cone tap 3 directions 3x through each leg; cone march 4 cones 4x each leg leading    Other Standing Knee Exercises heel toe raises 20x  each   finger support     Manual Therapy   Manual Therapy Joint mobilization;Soft tissue mobilization    Joint Mobilization --    Soft tissue mobilization --      Neck Exercises: Stretches   Upper Trapezius Stretch Limitations 30s 3x                  PT Education - 05/02/20 1546    Education Details HEP, strength deficits    Person(s) Educated Patient    Methods Explanation;Demonstration;Handout    Comprehension Verbalized understanding;Returned demonstration            PT Short Term Goals - 04/23/20 1511      PT SHORT TERM GOAL #1   Title Pt will demonstrate independence with HEP in order to demonstrate synthesis of HEP, compliance, and PT education.    Time 3    Period Weeks    Status New    Target Date 05/14/20      PT SHORT TERM GOAL #2   Title Pt to demonstrate improvement of 5xSTS to be below 12 seconds in order tomeet stroke population cut off score and  demonstrate functional improvement in LE strength.    Time 4    Period Weeks    Status New             PT Long Term Goals - 04/23/20 1514      PT LONG TERM GOAL #1   Title Pt will demonstrate ability to perform stairs with reciprocal pattern and single UE support in order to demonstrate functional improvement with stair mobility.    Time 8    Period Weeks    Status New      PT LONG TERM GOAL #2   Title Pt will demonstrate full floor to stand transfer with solid surface and UE support in order to demonstrate safety and functional improvement in transfers for full independence at home.    Time 8    Period Weeks    Status New                 Plan - 05/02/20 1547    Clinical Impression Statement Pt had improved C/S ROM at today's session and reported no pain. STM and joint mobilization were deferred to later session in order to focus on LE strengthening and balance. Pt was able to incorporate UE and scapular strengthening without increased cervical pain. Pt has difficulty with performing  cervical retraction even with  VC and TC. Pt continues to have balance and strength deficits with standing dynamic and static balance but does demonstrate better awareness of deficits and ability to perform self correction.Pt would benefit from continued skilled therapy in order to reduce neck pain and maximize mobility and strength for prevention of further functional decline.    Personal Factors and Comorbidities Comorbidity 1;Fitness;Comorbidity 2;Age    Examination-Activity Limitations Stairs;Squat;Lift;Stand;Transfers    Examination-Participation Restrictions Community Activity;Other;Driving;Cleaning    Stability/Clinical Decision Making Stable/Uncomplicated    Rehab Potential Good    PT Frequency 2x / week    PT Duration 8 weeks    PT Treatment/Interventions ADLs/Self Care Home Management;Iontophoresis '4mg'$ /ml Dexamethasone;Moist Heat;Electrical Stimulation;Ultrasound;Gait training;Stair training;Functional mobility training;Therapeutic activities;Therapeutic exercise;Balance training;Neuromuscular re-education;Patient/family education;Manual techniques;Passive range of motion;Dry needling;Taping;Spinal Manipulations;Joint Manipulations    PT Next Visit Plan review HEP, trial chin tuck again, lat pull down, bilat shoulder ER,    PT Home Exercise Plan printout provided    Consulted and Agree with Plan of Care Patient           Patient will benefit from skilled therapeutic intervention in order to improve the following deficits and impairments:  Abnormal gait,Decreased balance,Decreased mobility,Decreased endurance,Difficulty walking,Decreased range of motion,Pain,Decreased activity tolerance,Decreased strength,Impaired flexibility  Visit Diagnosis: Generalized weakness  Cervicalgia     Problem List Patient Active Problem List   Diagnosis Date Noted  . Coagulation defect ([place]) 10/12/2019  . Abnormal peripheral vision, left 01/21/2018  . Anemia, chronic disease   . Left hemiparesis  ([place])   . Acute ischemic cerebrovascular accident (CVA) involving right middle cerebral artery territory [facility])   . CKD (chronic kidney disease), stage III ([place])   . Peripheral edema   . Stroke (cerebrum) ([place]) 01/05/2018  . History of gout   . AKI (acute kidney injury) ([place])   . Stage 3 chronic kidney disease ([place])   . Anemia of chronic disease   . Left-sided weakness   . Osteoarthritis   . OSA (obstructive sleep apnea)   . Noncompliance with CPAP treatment   . History of DVT (deep vein thrombosis)   . History of CVA (cerebrovascular accident)   . Dysphagia, post-stroke   . Sinus tachycardia   . Acute blood loss anemia   . Acute CVA (cerebrovascular accident) ([place]) 01/01/2018  . Hypertensive urgency 01/01/2018  . CKD (chronic kidney disease) stage 2, GFR 60-89 ml/min 01/01/2018  . 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 Generalized weakness   . Sepsis ([place]) 12/10/2016  . Bacteremia due to Streptococcus 12/10/2016  . Lactic acidosis 12/10/2016  . Elevated troponin 12/10/2016  . Renal insufficiency 12/10/2016  . Hypophosphatemia 12/10/2016  . Left leg swelling 12/10/2016  . Febrile illness 12/09/2016  . Febrile illness, acute   . OBESITY, UNSPECIFIED 05/31/2009  . Hyperlipidemia 05/30/2009  . Obstructive sleep apnea 05/30/2009  . Essential hypertension 05/30/2009  . CAD (coronary artery disease) 05/30/2009    Daleen Bo PT, DPT 05/02/20 5:01 PM   [place], [place], [address] Phone: [phone]   Fax:  [phone]  Name: [name] MRN: [id number] Date of Birth: 05/26/1940

## 2020-05-07 ENCOUNTER — Other Ambulatory Visit: Payer: Self-pay

## 2020-05-07 ENCOUNTER — Encounter: Payer: Self-pay | Admitting: Physical Therapy

## 2020-05-07 ENCOUNTER — Ambulatory Visit: Payer: Medicare Other | Admitting: Physical Therapy

## 2020-05-07 DIAGNOSIS — R531 Weakness: Secondary | ICD-10-CM

## 2020-05-07 DIAGNOSIS — M542 Cervicalgia: Secondary | ICD-10-CM | POA: Diagnosis not present

## 2020-05-07 NOTE — Therapy (Signed)
[facility] [address] [place], [place], [address] Phone: [phone]   Fax:  [phone]  Physical Therapy Treatment  Patient Details  Name: [name] MRN: [id number] Date of Birth: 03-26-1940 No data recorded  Encounter Date: 05/07/2020   PT End of Session - 05/07/20 1801    Visit Number 4    Number of Visits 17    Date for PT Re-Evaluation 05/23/20    Authorization Type [employer] Medicare    PT Start Time 1518    PT Stop Time 1600    PT Time Calculation (min) 42 min    Equipment Utilized During Treatment Gait belt    Activity Tolerance Patient tolerated treatment well;No increased pain    Behavior During Therapy WFL for tasks assessed/performed           Past Medical History:  Diagnosis Date  . CAD (coronary artery disease)    s/p cath in 2005 and CABG x4  . Cataract   . DJD (degenerative joint disease)   . DJD (degenerative joint disease)   . Fluid retention    mild  . H/O: gout   . History of blood clots   . HTN (hypertension)   . Hyperlipidemia   . Hyperlipidemia   . Myocardial infarction ([place]) 2009  . Obesity    with reduction  . OSA (obstructive sleep apnea)    AHI 98/hr  . Renal insufficiency   . Sleep apnea   . Stroke [facility]) 2019    Past Surgical History:  Procedure Laterality Date  . CORONARY ARTERY BYPASS GRAFT  2005   LIMA to LAD, SVG to OM1 and OM 2, RCA.   [name] left inguinal hernia repair    . LOOP RECORDER INSERTION N/A 01/03/2018   Procedure: LOOP RECORDER INSERTION;  Surgeon: Evans Lance, MD;  Location: [facility];  Service: Cardiovascular;  Laterality: N/A;  . TEE WITHOUT CARDIOVERSION N/A 12/11/2016   Procedure: TRANSESOPHAGEAL ECHOCARDIOGRAM (TEE);  Surgeon: [name], MD;  Location: [facility] ENDOSCOPY;  Service: Cardiovascular;  Laterality: N/A;  . TEE WITHOUT CARDIOVERSION N/A 01/03/2018   Procedure: TRANSESOPHAGEAL ECHOCARDIOGRAM (TEE);  Surgeon: Dorothy Spark,  MD;  Location: [facility] ENDOSCOPY;  Service: Cardiovascular;  Laterality: N/A;    There were no vitals filed for this visit.   Subjective Assessment - 05/07/20 1522    Subjective Pt states that the neck is doing better and has not had any pain since last visit. Pt states he did well after last session and was not too sore. He did feel that his legs were "used."    Limitations Reading;Other (comment);House hold activities   Turning head while driving   Patient Stated Goals Pt states he would like to reduce his neck pain and be able to get off the floor easier.    Currently in Pain? No/denies    Pain Score 0-No pain    Pain Location Neck    Pain Orientation Left    Pain Onset 1 to 4 weeks ago    Multiple Pain Sites No                             [facility] Adult PT Treatment/Exercise - 05/07/20 0001      Transfers         Ambulation/Gait   Gait Pattern Decreased arm swing - left;Decreased step length - right;Decreased stride length;Decreased hip/knee flexion - right;Decreased hip/knee flexion -  left;Lateral trunk lean to right;Wide base of support      Posture/Postural Control   Posture/Postural Control No significant limitations    Postural Limitations Rounded Shoulders;Forward head      Exercises   Exercises Knee/Hip;Neck      Neck Exercises: Theraband   Rows Green;10 reps    Rows Limitations 2 sets      Neck Exercises: Standing   Neck Retraction --      Knee/Hip Exercises: Standing   Knee Flexion 20 reps    Knee Flexion Limitations single UE    Lateral Step Up 10 reps    Lateral Step Up Limitations 3" step, single UE support    Forward Step Up 2 sets;10 reps    Forward Step Up Limitations UE support    Functional Squat 5 reps;3 sets    Functional Squat Limitations STS with UE support on knees    Other Standing Knee Exercises standing march at counter 20x; cone weave 4x; retro walk 35 ft; standing hip ABD 2x10 each    Other Standing Knee Exercises heel toe  raises 20x each   finger support     Manual Therapy   Manual Therapy       Neck Exercises: Stretches   Upper Trapezius Stretch Limitations 30s 3x                  PT Education - 05/07/20 1800    Education Details HEP, anatomy, balance systems, exercise progression    Person(s) Educated Patient    Methods Demonstration    Comprehension Verbalized understanding;Returned demonstration            PT Short Term Goals - 04/23/20 1511      PT SHORT TERM GOAL #1   Title Pt will demonstrate independence with HEP in order to demonstrate synthesis of HEP, compliance, and PT education.    Time 3    Period Weeks    Status New    Target Date 05/14/20      PT SHORT TERM GOAL #2   Title Pt to demonstrate improvement of 5xSTS to be below 12 seconds in order tomeet stroke population cut off score and  demonstrate functional improvement in LE strength.    Time 4    Period Weeks    Status New             PT Long Term Goals - 04/23/20 1514      PT LONG TERM GOAL #1   Title Pt will demonstrate ability to perform stairs with reciprocal pattern and single UE support in order to demonstrate functional improvement with stair mobility.    Time 8    Period Weeks    Status New      PT LONG TERM GOAL #2   Title Pt will demonstrate full floor to stand transfer with solid surface and UE support in order to demonstrate safety and functional improvement in transfers for full independence at home.    Time 8    Period Weeks    Status New                 Plan - 05/07/20 1803    Clinical Impression Statement Pt demonstrates continued improvement C/S pain and ROM with abolishment of neck pain, even with original agg factor of yawning. However, with gait and balance, pt continues to have difficulty with SLS related activities or NBOS during dynamic and static activity. Pt does show improvement with STS transfer as eccentric strength  has greatly reduced uncontrolled descent. Pt found  particular difficulty with step through pattern with retro walk. Pt required CGA during all balance activity for safety and cuing for posture and form. Pt would benefit from continued skilled therapy in order to reduce neck pain and maximize mobility and strength for prevention of further functional decline.    Personal Factors and Comorbidities Comorbidity 1;Fitness;Comorbidity 2;Age    Examination-Activity Limitations Stairs;Squat;Lift;Stand;Transfers    Examination-Participation Restrictions Community Activity;Other;Driving;Cleaning    Stability/Clinical Decision Making Stable/Uncomplicated    Rehab Potential Good    PT Frequency 2x / week    PT Duration 8 weeks    PT Treatment/Interventions ADLs/Self Care Home Management;Iontophoresis '4mg'$ /ml Dexamethasone;Moist Heat;Electrical Stimulation;Ultrasound;Gait training;Stair training;Functional mobility training;Therapeutic activities;Therapeutic exercise;Balance training;Neuromuscular re-education;Patient/family education;Manual techniques;Passive range of motion;Dry needling;Taping;Spinal Manipulations;Joint Manipulations    PT Next Visit Plan review HEP, standing march at counter without UE, standing ABD review, STS with band    PT Home Exercise Plan printout provided    Consulted and Agree with Plan of Care Patient           Patient will benefit from skilled therapeutic intervention in order to improve the following deficits and impairments:  Abnormal gait,Decreased balance,Decreased mobility,Decreased endurance,Difficulty walking,Decreased range of motion,Pain,Decreased activity tolerance,Decreased strength,Impaired flexibility  Visit Diagnosis: Generalized weakness  Cervicalgia     Problem List Patient Active Problem List   Diagnosis Date Noted  . Coagulation defect ([place]) 10/12/2019  . Abnormal peripheral vision, left 01/21/2018  . Anemia, chronic disease   . Left hemiparesis ([place])   . Acute ischemic cerebrovascular accident  (CVA) involving right middle cerebral artery territory [facility])   . CKD (chronic kidney disease), stage III ([place])   . Peripheral edema   . 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 History of gout   . AKI (acute kidney injury) ([place])   . Stage 3 chronic kidney disease ([place])   . Anemia of chronic disease   . Left-sided weakness   . Osteoarthritis   . OSA (obstructive sleep apnea)   . Noncompliance with CPAP treatment   . History of DVT (deep vein thrombosis)   . History of CVA (cerebrovascular accident)   . Dysphagia, post-stroke   . Sinus tachycardia   . Acute blood loss anemia   . Acute CVA (cerebrovascular accident) ([place]) 01/01/2018  . Hypertensive urgency 01/01/2018  . CKD (chronic kidney disease) stage 2, GFR 60-89 ml/min 01/01/2018  . 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 Generalized weakness   . Sepsis ([place]) 12/10/2016  . Bacteremia due to Streptococcus 12/10/2016  . Lactic acidosis 12/10/2016  . Elevated troponin 12/10/2016  . Renal insufficiency 12/10/2016  . Hypophosphatemia 12/10/2016  . Left leg swelling 12/10/2016  . Febrile illness 12/09/2016  . Febrile illness, acute   . OBESITY, UNSPECIFIED 05/31/2009  . Hyperlipidemia 05/30/2009  . Obstructive sleep apnea 05/30/2009  . Essential hypertension 05/30/2009  . CAD (coronary artery disease) 05/30/2009    Daleen Bo PT, DPT 05/07/20 6:14 PM   [facility], [place], [address] Phone: [phone]   Fax:  [phone]  Name: EMAD BROCKETT MRN: [id number] Date of Birth: 01-18-1941

## 2020-05-09 ENCOUNTER — Other Ambulatory Visit: Payer: Self-pay

## 2020-05-09 ENCOUNTER — Encounter: Payer: Self-pay | Admitting: Physical Therapy

## 2020-05-09 ENCOUNTER — Ambulatory Visit: Payer: Medicare Other | Admitting: Physical Therapy

## 2020-05-09 DIAGNOSIS — M542 Cervicalgia: Secondary | ICD-10-CM | POA: Diagnosis not present

## 2020-05-09 DIAGNOSIS — R531 Weakness: Secondary | ICD-10-CM

## 2020-05-09 NOTE — Therapy (Signed)
[facility] [address] [place], [place], [address] Phone: [phone]   Fax:  [phone]  Physical Therapy Treatment  Patient Details  Name: [name] MRN: [id number] Date of Birth: Dec 03, 1940 No data recorded  Encounter Date: 05/09/2020   PT End of Session - 05/09/20 1512    Visit Number 5    Number of Visits 17    Date for PT Re-Evaluation 05/23/20    Authorization Type [employer]    PT Start Time 1515    PT Stop Time 1600    PT Time Calculation (min) 45 min    Equipment Utilized During Treatment Gait belt    Activity Tolerance Patient tolerated treatment well;No increased pain    Behavior During Therapy WFL for tasks assessed/performed           Past Medical History:  Diagnosis Date  . CAD (coronary artery disease)    s/p cath in 2005 and CABG x4  . Cataract   . DJD (degenerative joint disease)   . DJD (degenerative joint disease)   . Fluid retention    mild  . H/O: gout   . History of blood clots   . HTN (hypertension)   . Hyperlipidemia   . Hyperlipidemia   . Myocardial infarction ([place]) 2009  . Obesity    with reduction  . OSA (obstructive sleep apnea)    AHI 98/hr  . Renal insufficiency   . Sleep apnea   . Stroke [facility]) 2019    Past Surgical History:  Procedure Laterality Date  . CORONARY ARTERY BYPASS GRAFT  2005   LIMA to LAD, SVG to OM1 and OM 2, RCA.   [name] left inguinal hernia repair    . LOOP RECORDER INSERTION N/A 01/03/2018   Procedure: LOOP RECORDER INSERTION;  Surgeon: Evans Lance, MD;  Location: [place] [facility] CV LAB;  Service: Cardiovascular;  Laterality: N/A;  . TEE WITHOUT CARDIOVERSION N/A 12/11/2016   Procedure: TRANSESOPHAGEAL ECHOCARDIOGRAM (TEE);  Surgeon: [name], MD;  Location: [facility] ENDOSCOPY;  Service: Cardiovascular;  Laterality: N/A;  . TEE WITHOUT CARDIOVERSION N/A 01/03/2018   Procedure: TRANSESOPHAGEAL ECHOCARDIOGRAM (TEE);  Surgeon: Dorothy Spark,  MD;  Location: [facility] ENDOSCOPY;  Service: Cardiovascular;  Laterality: N/A;    There were no vitals filed for this visit.   Subjective Assessment - 05/09/20 1521    Subjective Pt states that the neck is continuing to do much better. He was not too sore after last session. He notices that it is easier for him to get out of his car now.    Limitations Reading;Other (comment);House hold activities   Turning head while driving   Patient Stated Goals Pt states he would like to reduce his neck pain and be able to get off the floor easier.    Currently in Pain? No/denies    Pain Score 0-No pain    Pain Onset 1 to 4 weeks ago                             [facility] Adult PT Treatment/Exercise - 05/09/20 0001      Transfers   Five time sit to stand comments  13.2s, eccentric lowering control deficit      Ambulation/Gait   Gait Pattern Decreased arm swing - left;Decreased step length - right;Decreased stride length;Decreased hip/knee flexion - right;Decreased hip/knee flexion - left;Lateral trunk lean to right;Wide base of support  Posture/Postural Control   Posture/Postural Control No significant limitations    Postural Limitations Rounded Shoulders;Forward head      Exercises   Exercises Knee/Hip;Neck      Neck Exercises: Theraband   Rows Green;10 reps   Rows and paloff Semi tandem stance and NBOS   Rows Limitations 2 sets      Knee/Hip Exercises: Standing   Knee Flexion 20 reps    Knee Flexion Limitations single UE    Lateral Step Up 10 reps    Lateral Step Up Limitations 3" step, single UE support    Forward Step Up 2 sets;10 reps    Forward Step Up Limitations UE support    Functional Squat 5 reps;3 sets    Functional Squat Limitations STS with UE support on knees    Other Standing Knee Exercises standing march at counter 20x;  retro walk 35 ft; standing hip ABD 2x10 each, side stepping 20 ft    Other Standing Knee Exercises heel toe raises 20x each   finger  support     Manual Therapy   Manual Therapy Joint mobilization;Soft tissue mobilization      Neck Exercises: Stretches   Upper Trapezius Stretch Limitations 30s 3x                  PT Education - 05/09/20 1522    Education Details HEP, anatomy, balance, strength progression, exercise progression    Person(s) Educated Patient    Methods Demonstration    Comprehension Verbalized understanding;Returned demonstration            PT Short Term Goals - 04/23/20 1511      PT SHORT TERM GOAL #1   Title Pt will demonstrate independence with HEP in order to demonstrate synthesis of HEP, compliance, and PT education.    Time 3    Period Weeks    Status New    Target Date 05/14/20      PT SHORT TERM GOAL #2   Title Pt to demonstrate improvement of 5xSTS to be below 12 seconds in order tomeet stroke population cut off score and  demonstrate functional improvement in LE strength.    Time 4    Period Weeks    Status New             PT Long Term Goals - 04/23/20 1514      PT LONG TERM GOAL #1   Title Pt will demonstrate ability to perform stairs with reciprocal pattern and single UE support in order to demonstrate functional improvement with stair mobility.    Time 8    Period Weeks    Status New      PT LONG TERM GOAL #2   Title Pt will demonstrate full floor to stand transfer with solid surface and UE support in order to demonstrate safety and functional improvement in transfers for full independence at home.    Time 8    Period Weeks    Status New                 Plan - 05/09/20 1512    Clinical Impression Statement Pt has had minimal neck pain within the last two sessions. Pt is also progressing LE strength as well though he still shows deficits with static and dynamic balance. Pt is able to tolerate increased duration of exercise before fatiguing and requiring seated rest breaks. Pt requires less UE support for standing stepping exercises though still does  lose balance in med-lat  direction. Pt is able to recover but has slowed stepping reaction. Pt required CGA during all balance activity for safety and cuing for posture and form. Pt would benefit from continued skilled therapy in order to reduce neck pain and maximize mobility and strength for prevention of further functional decline.    Personal Factors and Comorbidities Comorbidity 1;Fitness;Comorbidity 2;Age    Examination-Activity Limitations Stairs;Squat;Lift;Stand;Transfers    Examination-Participation Restrictions Community Activity;Other;Driving;Cleaning    Stability/Clinical Decision Making Stable/Uncomplicated    Rehab Potential Good    PT Frequency 2x / week    PT Duration 8 weeks    PT Treatment/Interventions ADLs/Self Care Home Management;Iontophoresis '4mg'$ /ml Dexamethasone;Moist Heat;Electrical Stimulation;Ultrasound;Gait training;Stair training;Functional mobility training;Therapeutic activities;Therapeutic exercise;Balance training;Neuromuscular re-education;Patient/family education;Manual techniques;Passive range of motion;Dry needling;Taping;Spinal Manipulations;Joint Manipulations    PT Next Visit Plan review HEP, cone marchingstanding ABD review, STS with band    PT Home Exercise Plan --    Consulted and Agree with Plan of Care Patient           Patient will benefit from skilled therapeutic intervention in order to improve the following deficits and impairments:  Abnormal gait,Decreased balance,Decreased mobility,Decreased endurance,Difficulty walking,Decreased range of motion,Pain,Decreased activity tolerance,Decreased strength,Impaired flexibility  Visit Diagnosis: Cervicalgia  Generalized weakness     Problem List Patient Active Problem List   Diagnosis Date Noted  . 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 Abnormal peripheral vision, left 01/21/2018  . Anemia, chronic disease   . Left hemiparesis ([place])   . Acute ischemic cerebrovascular accident (CVA) involving  right middle cerebral artery territory [facility])   . CKD (chronic kidney disease), stage III ([place])   . Peripheral edema   . 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 History of gout   . AKI (acute kidney injury) ([place])   . Stage 3 chronic kidney disease ([place])   . Anemia of chronic disease   . Left-sided weakness   . Osteoarthritis   . OSA (obstructive sleep apnea)   . Noncompliance with CPAP treatment   . History of DVT (deep vein thrombosis)   . History of CVA (cerebrovascular accident)   . Dysphagia, post-stroke   . Sinus tachycardia   . Acute blood loss anemia   . Acute CVA (cerebrovascular accident) ([place]) 01/01/2018  . Hypertensive urgency 01/01/2018  . CKD (chronic kidney disease) stage 2, GFR 60-89 ml/min 01/01/2018  . 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 Generalized weakness   . Sepsis ([place]) 12/10/2016  . Bacteremia due to Streptococcus 12/10/2016  . Lactic acidosis 12/10/2016  . Elevated troponin 12/10/2016  . Renal insufficiency 12/10/2016  . Hypophosphatemia 12/10/2016  . Left leg swelling 12/10/2016  . Febrile illness 12/09/2016  . Febrile illness, acute   . OBESITY, UNSPECIFIED 05/31/2009  . Hyperlipidemia 05/30/2009  . Obstructive sleep apnea 05/30/2009  . Essential hypertension 05/30/2009  . CAD (coronary artery disease) 05/30/2009   Daleen Bo PT, DPT 05/09/20 5:07 PM   [place], [place], [address] Phone: [phone]   Fax:  [phone]  Name: [name] MRN: [id number] Date of Birth: April 20, 1940

## 2020-05-14 ENCOUNTER — Ambulatory Visit: Payer: Medicare Other | Admitting: Physical Therapy

## 2020-05-14 ENCOUNTER — Other Ambulatory Visit: Payer: Self-pay

## 2020-05-14 ENCOUNTER — Encounter: Payer: Self-pay | Admitting: Physical Therapy

## 2020-05-14 DIAGNOSIS — M542 Cervicalgia: Secondary | ICD-10-CM | POA: Diagnosis not present

## 2020-05-14 DIAGNOSIS — R531 Weakness: Secondary | ICD-10-CM | POA: Diagnosis not present

## 2020-05-14 NOTE — Therapy (Signed)
[facility] [address] [place], [place], [address] Phone: [phone]   Fax:  [phone]  Physical Therapy Treatment  Patient Details  Name: [name] MRN: [id number] Date of Birth: May 06, 1940 No data recorded  Encounter Date: 05/14/2020   PT End of Session - 05/14/20 1706    Visit Number 6    Number of Visits 17    Date for PT Re-Evaluation 05/23/20    Authorization Type [employer] Medicare    PT Start Time [id number]    PT Stop Time 1600    PT Time Calculation (min) 30 min    Equipment Utilized During Treatment Gait belt    Activity Tolerance Patient tolerated treatment well;No increased pain    Behavior During Therapy WFL for tasks assessed/performed           Past Medical History:  Diagnosis Date  . CAD (coronary artery disease)    s/p cath in 2005 and CABG x4  . Cataract   . DJD (degenerative joint disease)   . DJD (degenerative joint disease)   . Fluid retention    mild  . H/O: gout   . History of blood clots   . HTN (hypertension)   . Hyperlipidemia   . Hyperlipidemia   . Myocardial infarction ([place]) 2009  . Obesity    with reduction  . OSA (obstructive sleep apnea)    AHI 98/hr  . Renal insufficiency   . Sleep apnea   . Stroke [facility]) 2019    Past Surgical History:  Procedure Laterality Date  . CORONARY ARTERY BYPASS GRAFT  2005   LIMA to LAD, SVG to OM1 and OM 2, RCA.   [name] left inguinal hernia repair    . LOOP RECORDER INSERTION N/A 01/03/2018   Procedure: LOOP RECORDER INSERTION;  Surgeon: Evans Lance, MD;  Location: [place] CV LAB;  Service: Cardiovascular;  Laterality: N/A;  . TEE WITHOUT CARDIOVERSION N/A 12/11/2016   Procedure: TRANSESOPHAGEAL ECHOCARDIOGRAM (TEE);  Surgeon: [name], MD;  Location: [facility] ENDOSCOPY;  Service: Cardiovascular;  Laterality: N/A;  . TEE WITHOUT CARDIOVERSION N/A 01/03/2018   Procedure: TRANSESOPHAGEAL ECHOCARDIOGRAM (TEE);  Surgeon: Dorothy Spark,  MD;  Location: [facility] ENDOSCOPY;  Service: Cardiovascular;  Laterality: N/A;    There were no vitals filed for this visit.   Subjective Assessment - 05/14/20 1705    Subjective Pt states his neck is no longer painful. He does notice a difference with the leg exercises and feels he is continuing to get stronger. He states he was a little sore into his legs after last session.    Limitations Reading;Other (comment);House hold activities ; getting up and down from floor   Patient Stated Goals Pt states he would like to reduce his neck pain and be able to get off the floor easier.    Currently in Pain? No/denies    Pain Onset 1 to 4 weeks ago                             [facility] Adult PT Treatment/Exercise - 05/14/20 0001      Transfers   Five time sit to stand comments  13.2s, eccentric lowering control deficit   baseline     Ambulation/Gait   Gait Pattern Decreased arm swing - left;Decreased step length - right;Decreased stride length;Decreased hip/knee flexion - right;Decreased hip/knee flexion - left;Lateral trunk lean to right;Wide base of support  Posture/Postural Control   Posture/Postural Control No significant limitations    Postural Limitations Rounded Shoulders;Forward head      Exercises   Exercises Knee/Hip;Neck      Neck Exercises: Theraband   Rows Paloff press NBOS blue 20x each    Rows Limitations --      Knee/Hip Exercises: Standing   Knee Flexion --    Knee Flexion Limitations --    Lateral Step Up 10 reps    Lateral Step Up Limitations 6" step, single UE support            Functional Squat 5 reps;3 sets    Functional Squat Limitations STS from low table height, no UE    Other Standing Knee Exercises standing march at counter 20x; standing hip ABD 2x10 each, side stepping 20 ft    Other Standing Knee Exercises heel toe raises 20x each; NBOS on foam 30s 3x                                    PT Education - 05/14/20 1706     Education Details HEP, balance, strength progression, exercise progression            PT Short Term Goals - 04/23/20 1511      PT SHORT TERM GOAL #1   Title Pt will demonstrate independence with HEP in order to demonstrate synthesis of HEP, compliance, and PT education.    Time 3    Period Weeks    Status New    Target Date 05/14/20      PT SHORT TERM GOAL #2   Title Pt to demonstrate improvement of 5xSTS to be below 12 seconds in order tomeet stroke population cut off score and  demonstrate functional improvement in LE strength.    Time 4    Period Weeks    Status New             PT Long Term Goals - 04/23/20 1514      PT LONG TERM GOAL #1   Title Pt will demonstrate ability to perform stairs with reciprocal pattern and single UE support in order to demonstrate functional improvement with stair mobility.    Time 8    Period Weeks    Status New      PT LONG TERM GOAL #2   Title Pt will demonstrate full floor to stand transfer with solid surface and UE support in order to demonstrate safety and functional improvement in transfers for full independence at home.    Time 8    Period Weeks    Status New                 Plan - 05/14/20 1706    Clinical Impression Statement Due to pt arrival time, visit was shortened length. Pt gave verbal understanding to shortened session. Pt no longer with neck pain but does continue to have LE strength and balance deficits. Pt demonstrates improvement with STS strength with no use of UE from a lower surface table. Pt continues to have difficulty with eccentric lowering strength and requires cuing for soft landing. Pt requires CGA for static balance exercise due to increased postural sway with NBOS. Pt is demonstrating good progression of strength and balance. Progress as tol at next session. Pt would benefit from continued skilled therapy in order to reduce neck pain and maximize mobility and strength for prevention of further  functional decline.    Personal Factors and Comorbidities Comorbidity 1;Fitness;Comorbidity 2;Age    Examination-Activity Limitations Stairs;Squat;Lift;Stand;Transfers    Examination-Participation Restrictions Community Activity;Other;Driving;Cleaning    Stability/Clinical Decision Making Stable/Uncomplicated    Rehab Potential Good    PT Frequency 2x / week    PT Duration 8 weeks    PT Treatment/Interventions ADLs/Self Care Home Management;Iontophoresis '4mg'$ /ml Dexamethasone;Moist Heat;Electrical Stimulation;Ultrasound;Gait training;Stair training;Functional mobility training;Therapeutic activities;Therapeutic exercise;Balance training;Neuromuscular re-education;Patient/family education;Manual techniques;Passive range of motion;Dry needling;Taping;Spinal Manipulations;Joint Manipulations    Consulted and Agree with Plan of Care Patient           Patient will benefit from skilled therapeutic intervention in order to improve the following deficits and impairments:  Abnormal gait,Decreased balance,Decreased mobility,Decreased endurance,Difficulty walking,Decreased range of motion,Pain,Decreased activity tolerance,Decreased strength,Impaired flexibility  Visit Diagnosis: Cervicalgia  Generalized weakness     Problem List Patient Active Problem List   Diagnosis Date Noted  . 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 Abnormal peripheral vision, left 01/21/2018  . Anemia, chronic disease   . Left hemiparesis ([place])   . Acute ischemic cerebrovascular accident (CVA) involving right middle cerebral artery territory [facility])   . CKD (chronic kidney disease), stage III ([place])   . Peripheral edema   . 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 History of gout   . AKI (acute kidney injury) ([place])   . Stage 3 chronic kidney disease ([place])   . Anemia of chronic disease   . Left-sided weakness   . Osteoarthritis   . OSA (obstructive sleep apnea)   . Noncompliance with CPAP treatment   . History of DVT  (deep vein thrombosis)   . History of CVA (cerebrovascular accident)   . Dysphagia, post-stroke   . Sinus tachycardia   . Acute blood loss anemia   . Acute CVA (cerebrovascular accident) ([place]) 01/01/2018  . Hypertensive urgency 01/01/2018  . CKD (chronic kidney disease) stage 2, GFR 60-89 ml/min 01/01/2018  . 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 Generalized weakness   . Sepsis ([place]) 12/10/2016  . Bacteremia due to Streptococcus 12/10/2016  . Lactic acidosis 12/10/2016  . Elevated troponin 12/10/2016  . Renal insufficiency 12/10/2016  . Hypophosphatemia 12/10/2016  . Left leg swelling 12/10/2016  . Febrile illness 12/09/2016  . Febrile illness, acute   . OBESITY, UNSPECIFIED 05/31/2009  . Hyperlipidemia 05/30/2009  . Obstructive sleep apnea 05/30/2009  . Essential hypertension 05/30/2009  . CAD (coronary artery disease) 05/30/2009    Daleen Bo PT, DPT 05/14/20 5:13 PM   [place], [place], [address] Phone: [phone]   Fax:  [phone]  Name: DAWSYN KALKMAN MRN: [id number] Date of Birth: 1940/10/19

## 2020-05-21 ENCOUNTER — Encounter: Payer: Self-pay | Admitting: Physical Therapy

## 2020-05-21 ENCOUNTER — Ambulatory Visit: Payer: Medicare Other | Admitting: Physical Therapy

## 2020-05-21 ENCOUNTER — Other Ambulatory Visit: Payer: Self-pay

## 2020-05-21 DIAGNOSIS — R531 Weakness: Secondary | ICD-10-CM

## 2020-05-21 DIAGNOSIS — M542 Cervicalgia: Secondary | ICD-10-CM | POA: Diagnosis not present

## 2020-05-21 NOTE — Therapy (Signed)
[facility] [address] [place], [place], [address] Phone: [phone]   Fax:  [phone]  Physical Therapy Treatment  Patient Details  Name: [name] MRN: [id number] Date of Birth: 1940/07/05 No data recorded  Encounter Date: 05/21/2020   PT End of Session - 05/21/20 1603    Visit Number 7    Number of Visits 17    Date for PT Re-Evaluation 05/23/20    Authorization Type [employer] Medicare    PT Start Time 1528    PT Stop Time 1600    PT Time Calculation (min) 32 min    Equipment Utilized During Treatment Gait belt    Activity Tolerance Patient tolerated treatment well;No increased pain    Behavior During Therapy WFL for tasks assessed/performed           Past Medical History:  Diagnosis Date  . CAD (coronary artery disease)    s/p cath in 2005 and CABG x4  . Cataract   . DJD (degenerative joint disease)   . DJD (degenerative joint disease)   . Fluid retention    mild  . H/O: gout   . History of blood clots   . HTN (hypertension)   . Hyperlipidemia   . Hyperlipidemia   . Myocardial infarction ([place]) 2009  . Obesity    with reduction  . OSA (obstructive sleep apnea)    AHI 98/hr  . Renal insufficiency   . Sleep apnea   . Stroke [facility]) 2019    Past Surgical History:  Procedure Laterality Date  . CORONARY ARTERY BYPASS GRAFT  2005   LIMA to LAD, SVG to OM1 and OM 2, RCA.   [name] left inguinal hernia repair    . LOOP RECORDER INSERTION N/A 01/03/2018   Procedure: LOOP RECORDER INSERTION;  Surgeon: Evans Lance, MD;  Location: [place] [facility];  Service: Cardiovascular;  Laterality: N/A;  . TEE WITHOUT CARDIOVERSION N/A 12/11/2016   Procedure: TRANSESOPHAGEAL ECHOCARDIOGRAM (TEE);  Surgeon: [name], MD;  Location: [facility] ENDOSCOPY;  Service: Cardiovascular;  Laterality: N/A;  . TEE WITHOUT CARDIOVERSION N/A 01/03/2018   Procedure: TRANSESOPHAGEAL ECHOCARDIOGRAM (TEE);  Surgeon: Dorothy Spark,  MD;  Location: [facility] ENDOSCOPY;  Service: Cardiovascular;  Laterality: N/A;    There were no vitals filed for this visit.   Subjective Assessment - 05/21/20 1531    Subjective Pt states the neck no longer hurts. He feels that the balances exercises are helping.    Limitations Reading;Other (comment);House hold activities   Turning head while driving   Patient Stated Goals Pt states he would like to reduce his neck pain and be able to get off the floor easier.    Pain Onset 1 to 4 weeks ago                             [facility] Adult PT Treatment/Exercise - 05/21/20 0001      Transfers   Five time sit to stand comments  13.2s, eccentric lowering control deficit   baseline     Ambulation/Gait   Gait Pattern Decreased arm swing - left;Decreased step length - right;Decreased stride length;Decreased hip/knee flexion - right;Decreased hip/knee flexion - left;Lateral trunk lean to right;Wide base of support                   Exercises   Exercises Knee/Hip;Neck      Neck Exercises: Theraband   Rows  20 reps   Rows and paloff Semi tandem stance and NBOS     Knee/Hip Exercises: Standing   Lateral Step Up 10 reps    Lateral Step Up Limitations 6" step, single UE support    Functional Squat 5 reps;3 sets    Functional Squat Limitations STS from low table height, no UE    Other Standing Knee Exercises side stepping 20 ft    Other Standing Knee Exercises heel toe raises 20x each; NBOS on foam 30s 3x  EC and semi tandem on foam 30s 2x; step up onto foam 10x each foot                 PT Education - 05/21/20 1541    Education Details HEP, balance, strength progression, exercise progression, transfer mechanics    Person(s) Educated Patient    Methods Explanation;Demonstration            PT Short Term Goals - 04/23/20 1511      PT SHORT TERM GOAL #1   Title Pt will demonstrate independence with HEP in order to demonstrate synthesis of HEP, compliance, and PT  education.    Time 3    Period Weeks    Status New    Target Date 05/14/20      PT SHORT TERM GOAL #2   Title Pt to demonstrate improvement of 5xSTS to be below 12 seconds in order tomeet stroke population cut off score and  demonstrate functional improvement in LE strength.    Time 4    Period Weeks    Status New             PT Long Term Goals - 04/23/20 1514      PT LONG TERM GOAL #1   Title Pt will demonstrate ability to perform stairs with reciprocal pattern and single UE support in order to demonstrate functional improvement with stair mobility.    Time 8    Period Weeks    Status New      PT LONG TERM GOAL #2   Title Pt will demonstrate full floor to stand transfer with solid surface and UE support in order to demonstrate safety and functional improvement in transfers for full independence at home.    Time 8    Period Weeks    Status New                 Plan - 05/21/20 1603    Clinical Impression Statement Pt arrived late to session. PT unable to accomodate. Pt was able to demonstrate continued improvement with DL and SL strength during step ups and STS from lower height. Pt is able to perform without UE support with STS but requires increased rest breaks. Pt also able to perform step ups with intermittent use of UE. Pt with most difficulty in tandem stance and with balance on pliant surface with EC. Pt does show improved stepping reaction, although is not as fluid with L stepping. Pt continues to requires CGA with balance exercise due to increased sway and guarding for LOB. Pt would benefit from continued skilled therapy in order to reduce neck pain and maximize mobility and strength for prevention of further functional decline.    Personal Factors and Comorbidities Comorbidity 1;Fitness;Comorbidity 2;Age    Examination-Activity Limitations Stairs;Squat;Lift;Stand;Transfers    Examination-Participation Restrictions Community Activity;Other;Driving;Cleaning     Stability/Clinical Decision Making Stable/Uncomplicated    Rehab Potential Good    PT Frequency 2x / week    PT  Duration 8 weeks    PT Treatment/Interventions ADLs/Self Care Home Management;Iontophoresis '4mg'$ /ml Dexamethasone;Moist Heat;Electrical Stimulation;Ultrasound;Gait training;Stair training;Functional mobility training;Therapeutic activities;Therapeutic exercise;Balance training;Neuromuscular re-education;Patient/family education;Manual techniques;Passive range of motion;Dry needling;Taping;Spinal Manipulations;Joint Manipulations    PT Next Visit Plan review HEP, cone marching, STS with foam from low height, STS with banded hip resistance posteriorly    Consulted and Agree with Plan of Care Patient           Patient will benefit from skilled therapeutic intervention in order to improve the following deficits and impairments:  Abnormal gait,Decreased balance,Decreased mobility,Decreased endurance,Difficulty walking,Decreased range of motion,Pain,Decreased activity tolerance,Decreased strength,Impaired flexibility  Visit Diagnosis: Cervicalgia  Generalized weakness     Problem List Patient Active Problem List   Diagnosis Date Noted  . Coagulation defect ([place]) 10/12/2019  . Abnormal peripheral vision, left 01/21/2018  . Anemia, chronic disease   . Left hemiparesis ([place])   . Acute ischemic cerebrovascular accident (CVA) involving right middle cerebral artery territory [facility])   . CKD (chronic kidney disease), stage III ([place])   . Peripheral edema   . 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 History of gout   . AKI (acute kidney injury) ([place])   . Stage 3 chronic kidney disease ([place])   . Anemia of chronic disease   . Left-sided weakness   . Osteoarthritis   . OSA (obstructive sleep apnea)   . Noncompliance with CPAP treatment   . History of DVT (deep vein thrombosis)   . History of CVA (cerebrovascular accident)   . Dysphagia, post-stroke   . Sinus tachycardia   . Acute blood  loss anemia   . Acute CVA (cerebrovascular accident) ([place]) 01/01/2018  . Hypertensive urgency 01/01/2018  . CKD (chronic kidney disease) stage 2, GFR 60-89 ml/min 01/01/2018  . 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 Generalized weakness   . Sepsis ([place]) 12/10/2016  . Bacteremia due to Streptococcus 12/10/2016  . Lactic acidosis 12/10/2016  . Elevated troponin 12/10/2016  . Renal insufficiency 12/10/2016  . Hypophosphatemia 12/10/2016  . Left leg swelling 12/10/2016  . Febrile illness 12/09/2016  . Febrile illness, acute   . OBESITY, UNSPECIFIED 05/31/2009  . Hyperlipidemia 05/30/2009  . Obstructive sleep apnea 05/30/2009  . Essential hypertension 05/30/2009  . CAD (coronary artery disease) 05/30/2009   Daleen Bo PT, DPT 05/21/20 5:10 PM   [facility] [place], [place], [address] Phone: [phone]   Fax:  [phone]  Name: TARRICK CARRAS MRN: [id number] Date of Birth: [birth year]

## 2020-05-23 ENCOUNTER — Encounter: Payer: Self-pay | Admitting: Physical Therapy

## 2020-05-23 ENCOUNTER — Ambulatory Visit: Payer: Medicare Other | Admitting: Physical Therapy

## 2020-05-23 ENCOUNTER — Other Ambulatory Visit: Payer: Self-pay

## 2020-05-23 DIAGNOSIS — R531 Weakness: Secondary | ICD-10-CM | POA: Diagnosis not present

## 2020-05-23 DIAGNOSIS — M542 Cervicalgia: Secondary | ICD-10-CM

## 2020-05-23 NOTE — Therapy (Signed)
[facility] [address] [place], [place], [address] Phone: [phone]   Fax:  [phone]  Physical Therapy Treatment/Progress Note  Patient Details  Name: [name] MRN: [id number] Date of Birth: 05-23-1940 No data recorded  Encounter Date: 05/23/2020   PT End of Session - 05/23/20 1848    Visit Number 8    Number of Visits 17    Date for PT Re-Evaluation 06/20/20    Authorization Type [employer] Medicare    PT Start Time 1520    PT Stop Time 1600    PT Time Calculation (min) 40 min    Equipment Utilized During Treatment Gait belt    Activity Tolerance Patient tolerated treatment well;No increased pain    Behavior During Therapy WFL for tasks assessed/performed           Past Medical History:  Diagnosis Date  . CAD (coronary artery disease)    s/p cath in 2005 and CABG x4  . Cataract   . DJD (degenerative joint disease)   . DJD (degenerative joint disease)   . Fluid retention    mild  . H/O: gout   . History of blood clots   . HTN (hypertension)   . Hyperlipidemia   . Hyperlipidemia   . Myocardial infarction ([place]) 2009  . Obesity    with reduction  . OSA (obstructive sleep apnea)    AHI 98/hr  . Renal insufficiency   . Sleep apnea   . Stroke [facility]) 2019    Past Surgical History:  Procedure Laterality Date  . CORONARY ARTERY BYPASS GRAFT  2005   LIMA to LAD, SVG to OM1 and OM 2, RCA.   [name] left inguinal hernia repair    . LOOP RECORDER INSERTION N/A 01/03/2018   Procedure: LOOP RECORDER INSERTION;  Surgeon: Evans Lance, MD;  Location: [place] [facility];  Service: Cardiovascular;  Laterality: N/A;  . TEE WITHOUT CARDIOVERSION N/A 12/11/2016   Procedure: TRANSESOPHAGEAL ECHOCARDIOGRAM (TEE);  Surgeon: [name], MD;  Location: [facility] ENDOSCOPY;  Service: Cardiovascular;  Laterality: N/A;  . TEE WITHOUT CARDIOVERSION N/A 01/03/2018   Procedure: TRANSESOPHAGEAL ECHOCARDIOGRAM (TEE);  Surgeon:  Dorothy Spark, MD;  Location: [facility] ENDOSCOPY;  Service: Cardiovascular;  Laterality: N/A;    There were no vitals filed for this visit.   Subjective Assessment - 05/23/20 1844    Subjective Pt states he is doing well. Pt reports no complaints. Pt does mention that legs were tired while standing in the grocery store line after last session.    Limitations Other (comment);House hold activities   Turning head while driving   Patient Stated Goals Pt states he would like to reduce his neck pain and be able to get off the floor easier.    Currently in Pain? No/denies    Pain Score 0-No pain                  [facility] PT Assessment - 05/23/20 0001      Assessment   Medical Diagnosis Neck pain and LE weakness    Hand Dominance Right    Prior Therapy HH, OT, PT post CVA      Precautions   Precautions None      Restrictions   Weight Bearing Restrictions No      Balance Screen   Has the patient fallen in the past 6 months No      [place] residence  Prior Function   Level of Independence Independent    Leisure bowling, antiquing      Cognition   Overall Cognitive Status Within Functional Limits for tasks assessed      Posture/Postural Control   Posture/Postural Control No significant limitations    Postural Limitations Rounded Shoulders;Forward head      AROM   Overall AROM Comments C/S AROM: most limited in bilateral SB and rotation. no pain with end range, still limited ~40% in each direction      PROM   Overall PROM  Deficits    Overall PROM Comments C/S PROM: stiffness in rotation, flexion and extension, pain at end range ~40% deficit      Strength   Overall Strength Deficits    Overall Strength Comments 4+/5 throughout L and R hips; L and R knee extension 5/5; L and R knee flexion 4+/5      Transfers   Five time sit to stand comments  12.4, decreased eccentric lowering control      Ambulation/Gait   Stairs Yes                          [facility] Adult PT Treatment/Exercise - 05/23/20 0001      Ambulation/Gait   Gait Pattern Decreased arm swing - left;Decreased step length - right;Decreased stride length;Decreased hip/knee flexion - right;Decreased hip/knee flexion - left;Lateral trunk lean to right;Wide base of support    Stair Management Technique One rail Right;Step to pattern      Exercises   Exercises Knee/Hip;Neck      Neck Exercises: Theraband   Rows 20 reps   paloff Semi tandem stance and NBOS     Knee/Hip Exercises: Standing   Lateral and Forward  Step Up 10 reps    Lateral Step Up Limitations 6" step, single UE support    Functional Squat 5 reps;3 sets    Functional Squat Limitations STS from low table height, no UE    Gait Training Cone weave, fwd and lat march 4x    Other Standing Knee Exercises standing march at counter 20x;  side stepping 20 ft; retro walking 55f    Other Standing Knee Exercises NBOS on foam 30s 3x EC semi tandem on foam 30s 2x EO                  PT Education - 05/23/20 1845    Education Details balance, strength progression, exercise progression, transfer mechanics    Person(s) Educated Patient    Methods Explanation;Demonstration;Tactile cues;Verbal cues    Comprehension Verbalized understanding;Returned demonstration            PT Short Term Goals - 05/23/20 1858      PT SHORT TERM GOAL #1   Title Pt will demonstrate independence with HEP in order to demonstrate synthesis of HEP, compliance, and PT education.    Time 3    Period Weeks    Status Partially Met    Target Date 05/14/20      PT SHORT TERM GOAL #2   Title Pt to demonstrate improvement of 5xSTS to be below 12 seconds in order tomeet stroke population cut off score and  demonstrate functional improvement in LE strength.    Time 4    Period Weeks    Status On-going             PT Long Term Goals - 05/23/20 1859      PT LONG TERM GOAL #  1   Title Pt will demonstrate  ability to perform stairs with reciprocal pattern and single UE support in order to demonstrate functional improvement with stair mobility.    Time 8    Period Weeks    Status Partially Met      PT LONG TERM GOAL #2   Title Pt will demonstrate full floor to stand transfer with solid surface and UE support in order to demonstrate safety and functional improvement in transfers for full independence at home.    Time 8    Period Weeks    Status On-going                 Plan - 05/23/20 1853    Clinical Impression Statement Pt demonstrates signficant improvement with neck pain since initial evaluation. Pt is still currently limited in ROM but pain has not been an issue for the past two weeks. However, pt still with balance and functional mobility deficits at this time. Pt has improved with STS transfer mechanics as well as LE strength as noted by improvement in 5XSTS and no longer requiring UE support. Pt is more fluid with stair stepping activity and has less significant LOB during dynamic balance exercise. Pt still with functional LE strength and motor control deficits limiting him from reaching his goal of a floor to stand transfer. Still requires CGA to supervision for balance exercise. Due to time, outcome measures will be performed at next session or soon thereafter. Pt would benefit from continued skilled therapy in order to reduce neck pain and maximize mobility and strength for prevention of further functional decline.    Personal Factors and Comorbidities Comorbidity 1;Fitness;Comorbidity 2;Age    Examination-Activity Limitations Stairs;Squat;Lift;Stand;Transfers    Examination-Participation Restrictions Community Activity;Other;Driving;Cleaning    Stability/Clinical Decision Making Stable/Uncomplicated    Rehab Potential Good    PT Frequency 2x / week    PT Duration 8 weeks    PT Treatment/Interventions ADLs/Self Care Home Management;Iontophoresis 4mg /ml Dexamethasone;Moist  Heat;Electrical Stimulation;Ultrasound;Gait training;Stair training;Functional mobility training;Therapeutic activities;Therapeutic exercise;Balance training;Neuromuscular re-education;Patient/family education;Manual techniques;Passive range of motion;Dry needling;Taping;Spinal Manipulations;Joint Manipulations    PT Next Visit Plan review HEP, cone weaving, STS with foam from low height, STS with banded hip resistance posteriorly, NDI and LEFS*    Consulted and Agree with Plan of Care Patient           Patient will benefit from skilled therapeutic intervention in order to improve the following deficits and impairments:  Abnormal gait,Decreased balance,Decreased mobility,Decreased endurance,Difficulty walking,Decreased range of motion,Pain,Decreased activity tolerance,Decreased strength,Impaired flexibility  Visit Diagnosis: Cervicalgia  Generalized weakness     Problem List Patient Active Problem List   Diagnosis Date Noted  . Coagulation defect ([place]) 10/12/2019  . Abnormal peripheral vision, left 01/21/2018  . Anemia, chronic disease   . Left hemiparesis ([place])   . Acute ischemic cerebrovascular accident (CVA) involving right middle cerebral artery territory [facility])   . CKD (chronic kidney disease), stage III ([place])   . Peripheral edema   . 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 History of gout   . AKI (acute kidney injury) ([place])   . Stage 3 chronic kidney disease ([place])   . Anemia of chronic disease   . Left-sided weakness   . Osteoarthritis   . OSA (obstructive sleep apnea)   . Noncompliance with CPAP treatment   . History of DVT (deep vein thrombosis)   . History of CVA (cerebrovascular accident)   . Dysphagia, post-stroke   . Sinus tachycardia   .  Acute blood loss anemia   . Acute CVA (cerebrovascular accident) ([place]) 01/01/2018  . Hypertensive urgency 01/01/2018  . CKD (chronic kidney disease) stage 2, GFR 60-89 ml/min 01/01/2018  . Sensorineural hearing loss (SNHL),  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  . Generalized weakness   . Sepsis ([place]) 12/10/2016  . Bacteremia due to Streptococcus 12/10/2016  . Lactic acidosis 12/10/2016  . Elevated troponin 12/10/2016  . Renal insufficiency 12/10/2016  . Hypophosphatemia 12/10/2016  . Left leg swelling 12/10/2016  . Febrile illness 12/09/2016  . Febrile illness, acute   . OBESITY, UNSPECIFIED 05/31/2009  . Hyperlipidemia 05/30/2009  . Obstructive sleep apnea 05/30/2009  . Essential hypertension 05/30/2009  . CAD (coronary artery disease) 05/30/2009    Daleen Bo PT, DPT 05/23/20 7:29 PM   [place], [place], [address] Phone: [phone]   Fax:  [phone]  Name: [name] MRN: [id number] Date of Birth: 1940/12/20

## 2020-05-25 LAB — CUP PACEART REMOTE DEVICE CHECK
Date Time Interrogation Session: 20220323012802
Implantable Pulse Generator Implant Date: 20191104

## 2020-05-27 ENCOUNTER — Ambulatory Visit (INDEPENDENT_AMBULATORY_CARE_PROVIDER_SITE_OTHER): Payer: Medicare Other

## 2020-05-27 DIAGNOSIS — I639 Cerebral infarction, unspecified: Secondary | ICD-10-CM

## 2020-05-28 ENCOUNTER — Encounter: Payer: Self-pay | Admitting: Physical Therapy

## 2020-05-28 ENCOUNTER — Other Ambulatory Visit: Payer: Self-pay

## 2020-05-28 ENCOUNTER — Ambulatory Visit: Payer: Medicare Other | Admitting: Physical Therapy

## 2020-05-28 DIAGNOSIS — R531 Weakness: Secondary | ICD-10-CM

## 2020-05-28 DIAGNOSIS — M542 Cervicalgia: Secondary | ICD-10-CM | POA: Diagnosis not present

## 2020-05-28 NOTE — Patient Instructions (Signed)
Access Code: [email or web address] URL: [email or web address] Date: 05/28/2020 Prepared by: Daleen Bo  Exercises Step Up - 1 x daily - 3-4 x weekly - 2 sets - 10 reps Heel Toe Raises with Unilateral Counter Support - 1 x daily - 3-4 x weekly - 3 sets - 10 reps Heel Toe Raises with Counter Support - 1 x daily - 3-4 x weekly - 3 sets - 10 reps Standing March with Counter Support - 1 x daily - 3-4 x weekly - 3 sets - 10 reps Sit to Stand - 1 x daily - 3-4 x weekly - 2 sets - 10 reps Supine Bridge - 1 x daily - 3-4 x weekly - 2 sets - 10 reps

## 2020-05-28 NOTE — Therapy (Signed)
[facility] [address] [place], [place], [address] Phone: [phone]   Fax:  [phone]  Physical Therapy Treatment  Patient Details  Name: [name] MRN: [id number] Date of Birth: 07/10/1940 No data recorded  Encounter Date: 05/28/2020   PT End of Session - 05/28/20 1526    Visit Number 9    Number of Visits 17    Date for PT Re-Evaluation 06/20/20    Authorization Type [employer]    PT Start Time 4010    PT Stop Time 1600    PT Time Calculation (min) 45 min    Equipment Utilized During Treatment Gait belt    Activity Tolerance Patient tolerated treatment well;No increased pain    Behavior During Therapy WFL for tasks assessed/performed           Past Medical History:  Diagnosis Date  . CAD (coronary artery disease)    s/p cath in 2005 and CABG x4  . Cataract   . DJD (degenerative joint disease)   . DJD (degenerative joint disease)   . Fluid retention    mild  . H/O: gout   . History of blood clots   . HTN (hypertension)   . Hyperlipidemia   . Hyperlipidemia   . Myocardial infarction ([place]) 2009  . Obesity    with reduction  . OSA (obstructive sleep apnea)    AHI 98/hr  . Renal insufficiency   . Sleep apnea   . Stroke [facility]) 2019    Past Surgical History:  Procedure Laterality Date  . CORONARY ARTERY BYPASS GRAFT  2005   LIMA to LAD, SVG to OM1 and OM 2, RCA.   [name] left inguinal hernia repair    . LOOP RECORDER INSERTION N/A 01/03/2018   Procedure: LOOP RECORDER INSERTION;  Surgeon: Evans Lance, MD;  Location: [facility];  Service: Cardiovascular;  Laterality: N/A;  . TEE WITHOUT CARDIOVERSION N/A 12/11/2016   Procedure: TRANSESOPHAGEAL ECHOCARDIOGRAM (TEE);  Surgeon: [name], MD;  Location: [facility] ENDOSCOPY;  Service: Cardiovascular;  Laterality: N/A;  . TEE WITHOUT CARDIOVERSION N/A 01/03/2018   Procedure: TRANSESOPHAGEAL ECHOCARDIOGRAM (TEE);  Surgeon: Dorothy Spark,  MD;  Location: [facility] ENDOSCOPY;  Service: Cardiovascular;  Laterality: N/A;    There were no vitals filed for this visit.   Subjective Assessment - 05/28/20 1517    Subjective Pt states he is doing well and feels that his balance and his legs are continuing to get better. He has had no neck pain for several sessions.    Limitations Other (comment);House hold activities   Turning head while driving   Patient Stated Goals Pt states he would like to reduce his neck pain and be able to get off the floor easier.    Currently in Pain? No/denies    Pain Score 0-No pain    Pain Onset 1 to 4 weeks ago              [facility] PT Assessment - 05/28/20 0001      Assessment   Medical Diagnosis Neck pain and LE weakness    Hand Dominance Right    Prior Therapy HH, OT, PT post CVA      Precautions   Precautions None      Restrictions   Weight Bearing Restrictions No      Balance Screen   Has the patient fallen in the past 6 months No      Home Environment  Living Environment Private residence      Prior Function   Level of Independence Independent    Leisure bowling, antiquing      Cognition   Overall Cognitive Status Within Functional Limits for tasks assessed      Observation/Other Assessments   Neck Disability Index  0% Disability    Lower Extremity Functional Scale  100%                         [facility] Adult PT Treatment/Exercise - 05/28/20 0001      Transfers   Five time sit to stand comments  12.4, decreased eccentric lowering control      Ambulation/Gait   Gait Pattern Decreased arm swing - left;Decreased step length - right;Decreased stride length;Decreased hip/knee flexion - right;Decreased hip/knee flexion - left;Lateral trunk lean to right;Wide base of support    Stairs --    Stair Management Technique --      Posture/Postural Control   Posture/Postural Control --    Postural Limitations --      Therapeutic Activites    Therapeutic Activities Other  Therapeutic Activities    Other Therapeutic Activities half kneeling to stand transfers 3x with foam under L knee; bilat UE support      Exercises   Exercises Knee/Hip;Neck      Neck Exercises: Theraband   Rows 20 reps   paloff Semi tandem stance and NBOS     Knee/Hip Exercises: Stretches   Other Knee/Hip Stretches standing calf stretch at step 30s 2x each      Knee/Hip Exercises: Standing   Lateral Step Up 10 reps    Lateral Step Up Limitations 3" step, single UE support    Functional Squat 5 reps;3 sets    Functional Squat Limitations STS onto foam, no UE    Gait Training --    Other Standing Knee Exercises standing march at counter 20x;  side stepping 20 ft; retro walking 66f    Other Standing Knee Exercises NBOS on foam 30s 3x                  PT Education - 05/28/20 1522    Education Details POC, balance, exercise progression, transfer mDealer HEP    Person(s) Educated Patient    Methods Explanation;Demonstration;Tactile cues;Handout    Comprehension Verbalized understanding;Returned demonstration            PT Short Term Goals - 05/23/20 1858      PT SHORT TERM GOAL #1   Title Pt will demonstrate independence with HEP in order to demonstrate synthesis of HEP, compliance, and PT education.    Time 3    Period Weeks    Status Partially Met    Target Date 05/14/20      PT SHORT TERM GOAL #2   Title Pt to demonstrate improvement of 5xSTS to be below 12 seconds in order tomeet stroke population cut off score and  demonstrate functional improvement in LE strength.    Time 4    Period Weeks    Status On-going             PT Long Term Goals - 05/23/20 1859      PT LONG TERM GOAL #1   Title Pt will demonstrate ability to perform stairs with reciprocal pattern and single UE support in order to demonstrate functional improvement with stair mobility.    Time 8    Period Weeks    Status Partially  Met      PT LONG TERM GOAL #2   Title Pt will  demonstrate full floor to stand transfer with solid surface and UE support in order to demonstrate safety and functional improvement in transfers for full independence at home.    Time 8    Period Weeks    Status On-going                 Plan - 05/28/20 1527    Clinical Impression Statement Pt continues to progress well with standing balance and LE strengthening activities.Pt able to perform standing balance with CGA-SBA.  Pt's outcome measures also reflect self reported improvements. Pt is able to perform fwd and lateral step ups with no UE support but does occasionally require cuing for increased hip flexion in order to clear toes from stairs. Pt still has trouble with eccentric lowering motions with all exercises but responded well to STS cuing for hip hinging during lowering to avoid sudden sitting. Pt was able able to progress to half kneeling to stand transfers with bilateral support and CGA.  Pt required extended rest break following due to fatigue but has made significant improvement with balance and LE strength. Progress strengthening on SL as tolerated to progress towards full floor to stand. Pt would benefit from continued skilled therapy in order to reduce neck pain and maximize mobility and strength for prevention of further functional decline.    Personal Factors and Comorbidities Comorbidity 1;Fitness;Comorbidity 2;Age    Examination-Activity Limitations Stairs;Squat;Lift;Stand;Transfers    Examination-Participation Restrictions Community Activity;Other;Driving;Cleaning    Stability/Clinical Decision Making Stable/Uncomplicated    Rehab Potential Good    PT Frequency 2x / week    PT Duration 8 weeks    PT Treatment/Interventions ADLs/Self Care Home Management;Iontophoresis 70m/ml Dexamethasone;Moist Heat;Electrical Stimulation;Ultrasound;Gait training;Stair training;Functional mobility training;Therapeutic activities;Therapeutic exercise;Balance training;Neuromuscular  re-education;Patient/family education;Manual techniques;Passive range of motion;Dry needling;Taping;Spinal Manipulations;Joint Manipulations    PT Next Visit Plan review HEP, kneeling transfers, STS with foam from low height, STS with banded hip resistance posteriorly,    Consulted and Agree with Plan of Care Patient           Patient will benefit from skilled therapeutic intervention in order to improve the following deficits and impairments:  Abnormal gait,Decreased balance,Decreased mobility,Decreased endurance,Difficulty walking,Decreased range of motion,Pain,Decreased activity tolerance,Decreased strength,Impaired flexibility  Visit Diagnosis: Cervicalgia  Generalized weakness     Problem List Patient Active Problem List   Diagnosis Date Noted  . Coagulation defect ([facility] 10/12/2019  . Abnormal peripheral vision, left 01/21/2018  . Anemia, chronic disease   . Left hemiparesis ([place]   . Acute ischemic cerebrovascular accident (CVA) involving right middle cerebral artery territory ([facility]   . CKD (chronic kidney disease), stage III ([place]   . Peripheral edema   . Stroke (cerebrum) ([place] 01/05/2018  . History of gout   . AKI (acute kidney injury) ([facility]   . Stage 3 chronic kidney disease ([facility]   . Anemia of chronic disease   . Left-sided weakness   . Osteoarthritis   . OSA (obstructive sleep apnea)   . Noncompliance with CPAP treatment   . History of DVT (deep vein thrombosis)   . History of CVA (cerebrovascular accident)   . Dysphagia, post-stroke   . Sinus tachycardia   . Acute blood loss anemia   . Acute CVA (cerebrovascular accident) ([place] 01/01/2018  . Hypertensive urgency 01/01/2018  . CKD (chronic kidney disease) stage 2, GFR 60-89 ml/min 01/01/2018  . Sensorineural hearing loss (SNHL), bilateral  08/03/2017  . Generalized weakness   . Sepsis ([place]) 12/10/2016  . Bacteremia due to Streptococcus 12/10/2016  . Lactic acidosis 12/10/2016  . Elevated troponin  12/10/2016  . Renal insufficiency 12/10/2016  . Hypophosphatemia 12/10/2016  . Left leg swelling 12/10/2016  . Febrile illness 12/09/2016  . Febrile illness, acute   . OBESITY, UNSPECIFIED 05/31/2009  . Hyperlipidemia 05/30/2009  . Obstructive sleep apnea 05/30/2009  . Essential hypertension 05/30/2009  . CAD (coronary artery disease) 05/30/2009   Daleen Bo PT, DPT 05/28/20 5:08 PM   [facility] [place], [place], [address] Phone: [phone]   Fax:  [phone]  Name: KEJON FEILD MRN: [id number] Date of Birth: 1940/09/03

## 2020-05-30 ENCOUNTER — Encounter: Payer: Self-pay | Admitting: Physical Therapy

## 2020-05-30 ENCOUNTER — Other Ambulatory Visit: Payer: Self-pay

## 2020-05-30 ENCOUNTER — Ambulatory Visit: Payer: Medicare Other | Admitting: Physical Therapy

## 2020-05-30 DIAGNOSIS — R531 Weakness: Secondary | ICD-10-CM

## 2020-05-30 DIAGNOSIS — M542 Cervicalgia: Secondary | ICD-10-CM

## 2020-05-30 NOTE — Therapy (Signed)
[facility] [address] [place], [place], [address] Phone: [phone]   Fax:  [phone]  Physical Therapy Treatment  Patient Details  Name: [name] MRN: [id number] Date of Birth: 1940/09/27 No data recorded  Encounter Date: 05/30/2020   PT End of Session - 05/30/20 2057    Visit Number 10    Number of Visits 17    Date for PT Re-Evaluation 06/20/20    Authorization Type [employer] Medicare    PT Start Time 3785    PT Stop Time 1600    PT Time Calculation (min) 45 min    Equipment Utilized During Treatment Gait belt    Activity Tolerance Patient tolerated treatment well;No increased pain    Behavior During Therapy WFL for tasks assessed/performed           Past Medical History:  Diagnosis Date  . CAD (coronary artery disease)    s/p cath in 2005 and CABG x4  . Cataract   . DJD (degenerative joint disease)   . DJD (degenerative joint disease)   . Fluid retention    mild  . H/O: gout   . History of blood clots   . HTN (hypertension)   . Hyperlipidemia   . Hyperlipidemia   . Myocardial infarction ([place]) 2009  . Obesity    with reduction  . OSA (obstructive sleep apnea)    AHI 98/hr  . Renal insufficiency   . Sleep apnea   . Stroke [facility]) 2019    Past Surgical History:  Procedure Laterality Date  . CORONARY ARTERY BYPASS GRAFT  2005   LIMA to LAD, SVG to OM1 and OM 2, RCA.   [name] left inguinal hernia repair    . LOOP RECORDER INSERTION N/A 01/03/2018   Procedure: LOOP RECORDER INSERTION;  Surgeon: Evans Lance, MD;  Location: [facility];  Service: Cardiovascular;  Laterality: N/A;  . TEE WITHOUT CARDIOVERSION N/A 12/11/2016   Procedure: TRANSESOPHAGEAL ECHOCARDIOGRAM (TEE);  Surgeon: [name], MD;  Location: [facility] ENDOSCOPY;  Service: Cardiovascular;  Laterality: N/A;  . TEE WITHOUT CARDIOVERSION N/A 01/03/2018   Procedure: TRANSESOPHAGEAL ECHOCARDIOGRAM (TEE);  Surgeon: Dorothy Spark,  MD;  Location: [facility] ENDOSCOPY;  Service: Cardiovascular;  Laterality: N/A;    There were no vitals filed for this visit.   Subjective Assessment - 05/30/20 1604    Subjective Pt reports that things are getting better. He was very tired after last session and fatigued but he felt that the exercises are helping.    Limitations Other (comment);House hold activities   Turning head while driving   Patient Stated Goals Pt states he would like to reduce his neck pain and be able to get off the floor easier.    Currently in Pain? No/denies    Pain Score 0-No pain    Pain Onset 1 to 4 weeks ago                             [facility] Adult PT Treatment/Exercise - 05/30/20 0001      Transfers   Five time sit to stand comments  --      Ambulation/Gait   Gait Pattern Decreased arm swing - left;Decreased step length - right;Decreased stride length;Decreased hip/knee flexion - right;Decreased hip/knee flexion - left;Lateral trunk lean to right;Wide base of support      Therapeutic Activites    Therapeutic Activities Other Therapeutic Activities  Other Therapeutic Activities half kneeling to stand transfers 3x with foam under L knee; bilat UE support; 1x with R knee down      Exercises   Exercises Knee/Hip;Neck      Neck Exercises: Theraband   Rows 20 reps   paloff Semi tandem stance and NBOS     Knee/Hip Exercises: Stretches   Other Knee/Hip Stretches standing calf stretch at step 30s 2x each      Knee/Hip Exercises: Standing   Lateral Step Up --    Lateral Step Up Limitations --    Functional Squat 5 reps;3 sets    Functional Squat Limitations STS onto foam, no UE    Other Standing Knee Exercises standing march at counter 20x;  side stepping 20 ft; retro walking 71f, HR/TR 20x   Other Standing Knee Exercises NBOS on foam 30s 3x                  PT Education - 05/30/20 2056    Education Details POC, balance, exercise progression, transfer mechanics, HEP frequency     Person(s) Educated Patient    Methods Explanation;Demonstration;Tactile cues    Comprehension Verbalized understanding;Returned demonstration            PT Short Term Goals - 05/23/20 1858      PT SHORT TERM GOAL #1   Title Pt will demonstrate independence with HEP in order to demonstrate synthesis of HEP, compliance, and PT education.    Time 3    Period Weeks    Status Partially Met    Target Date 05/14/20      PT SHORT TERM GOAL #2   Title Pt to demonstrate improvement of 5xSTS to be below 12 seconds in order tomeet stroke population cut off score and  demonstrate functional improvement in LE strength.    Time 4    Period Weeks    Status On-going             PT Long Term Goals - 05/23/20 1859      PT LONG TERM GOAL #1   Title Pt will demonstrate ability to perform stairs with reciprocal pattern and single UE support in order to demonstrate functional improvement with stair mobility.    Time 8    Period Weeks    Status Partially Met      PT LONG TERM GOAL #2   Title Pt will demonstrate full floor to stand transfer with solid surface and UE support in order to demonstrate safety and functional improvement in transfers for full independence at home.    Time 8    Period Weeks    Status On-going                 Plan - 05/30/20 2057    Clinical Impression Statement Pt continues to progress with functional strengthening, balance, and transfers at today's session. Pt was able to perform kneeling transfers with good mechanics with minimal cuing and CGA. Pt was also able to perform kneeling to stand from opposite side today but has significantly decreased control with R knee down due to L LE strength. Pt appears to have ankle mobility deficits that are also affecting kneeling weight shift and transitions. Pt does continue to show endurance deficits with sustained activity. Pt would benefit from continued skilled therapy in order to reduce neck pain and maximize  mobility and strength for prevention of further functional decline.    Personal Factors and Comorbidities Comorbidity 1;Fitness;Comorbidity 2;Age  Examination-Activity Limitations Stairs;Squat;Lift;Stand;Transfers    Examination-Participation Restrictions Community Activity;Other;Driving;Cleaning    Stability/Clinical Decision Making Stable/Uncomplicated    Rehab Potential Good    PT Frequency 2x / week    PT Duration 8 weeks    PT Treatment/Interventions ADLs/Self Care Home Management;Iontophoresis 4mg/ml Dexamethasone;Moist Heat;Electrical Stimulation;Ultrasound;Gait training;Stair training;Functional mobility training;Therapeutic activities;Therapeutic exercise;Balance training;Neuromuscular re-education;Patient/family education;Manual techniques;Passive range of motion;Dry needling;Taping;Spinal Manipulations;Joint Manipulations    PT Next Visit Plan review HEP, kneeling transfers both sides, STS with foam from lower height, mini squat    Consulted and Agree with Plan of Care Patient           Patient will benefit from skilled therapeutic intervention in order to improve the following deficits and impairments:  Abnormal gait,Decreased balance,Decreased mobility,Decreased endurance,Difficulty walking,Decreased range of motion,Pain,Decreased activity tolerance,Decreased strength,Impaired flexibility  Visit Diagnosis: Cervicalgia  Generalized weakness     Problem List Patient Active Problem List   Diagnosis Date Noted  . Coagulation defect ([facility]) 10/12/2019  . Abnormal peripheral vision, left 01/21/2018  . Anemia, chronic disease   . Left hemiparesis ([facility])   . Acute ischemic cerebrovascular accident (CVA) involving right middle cerebral artery territory ([facility])   . CKD (chronic kidney disease), stage III ([facility])   . Peripheral edema   . Stroke (cerebrum) ([facility]) 01/05/2018  . History of gout   . AKI (acute kidney injury) ([facility])   . Stage 3 chronic kidney disease (H[facility])   . Anemia of  chronic disease   . Left-sided weakness   . Osteoarthritis   . OSA (obstructive sleep apnea)   . Noncompliance with CPAP treatment   . History of DVT (deep vein thrombosis)   . History of CVA (cerebrovascular accident)   . Dysphagia, post-stroke   . Sinus tachycardia   . Acute blood loss anemia   . Acute CVA (cerebrovascular accident) ([facility]) 01/01/2018  . Hypertensive urgency 01/01/2018  . CKD (chronic kidney disease) stage 2, GFR 60-89 ml/min 01/01/2018  . 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 Generalized weakness   . Sepsis (H[facility]) 12/10/2016  . Bacteremia due to Streptococcus 12/10/2016  . Lactic acidosis 12/10/2016  . Elevated troponin 12/10/2016  . Renal insufficiency 12/10/2016  . Hypophosphatemia 12/10/2016  . Left leg swelling 12/10/2016  . Febrile illness 12/09/2016  . Febrile illness, acute   . OBESITY, UNSPECIFIED 05/31/2009  . Hyperlipidemia 05/30/2009  . Obstructive sleep apnea 05/30/2009  . Essential hypertension 05/30/2009  . CAD (coronary artery disease) 05/30/2009   Alan Zhou PT, DPT 05/30/20 9:02 PM   [facility] [address] [place], [place], [address] Phone: [phone]   Fax:  [phone]  Name: Koehn T Nehme MRN: [id number] Date of Birth: 09/11/1940   

## 2020-06-04 ENCOUNTER — Ambulatory Visit: Payer: Medicare Other | Admitting: Physical Therapy

## 2020-06-04 ENCOUNTER — Other Ambulatory Visit: Payer: Self-pay

## 2020-06-04 ENCOUNTER — Encounter: Payer: Self-pay | Admitting: Physical Therapy

## 2020-06-04 DIAGNOSIS — R531 Weakness: Secondary | ICD-10-CM

## 2020-06-04 DIAGNOSIS — M542 Cervicalgia: Secondary | ICD-10-CM

## 2020-06-04 NOTE — Therapy (Signed)
[facility] [address] [place], [place], [address] Phone: [phone]   Fax:  [phone]  Physical Therapy Treatment  Patient Details  Name: [name] MRN: [id number] Date of Birth: 11/04/1940 No data recorded  Encounter Date: 06/04/2020   PT End of Session - 06/04/20 1804    Visit Number 11    Number of Visits 17    Date for PT Re-Evaluation 06/20/20    Authorization Type [employer]    PT Start Time 5176    PT Stop Time 1600    PT Time Calculation (min) 45 min    Equipment Utilized During Treatment Gait belt    Activity Tolerance Patient tolerated treatment well;No increased pain    Behavior During Therapy WFL for tasks assessed/performed           Past Medical History:  Diagnosis Date  . CAD (coronary artery disease)    s/p cath in 2005 and CABG x4  . Cataract   . DJD (degenerative joint disease)   . DJD (degenerative joint disease)   . Fluid retention    mild  . H/O: gout   . History of blood clots   . HTN (hypertension)   . Hyperlipidemia   . Hyperlipidemia   . Myocardial infarction ([place]) 2009  . Obesity    with reduction  . OSA (obstructive sleep apnea)    AHI 98/hr  . Renal insufficiency   . Sleep apnea   . Stroke [facility]) 2019    Past Surgical History:  Procedure Laterality Date  . CORONARY ARTERY BYPASS GRAFT  2005   LIMA to LAD, SVG to OM1 and OM 2, RCA.   [name] left inguinal hernia repair    . LOOP RECORDER INSERTION N/A 01/03/2018   Procedure: LOOP RECORDER INSERTION;  Surgeon: Evans Lance, MD;  Location: [facility];  Service: Cardiovascular;  Laterality: N/A;  . TEE WITHOUT CARDIOVERSION N/A 12/11/2016   Procedure: TRANSESOPHAGEAL ECHOCARDIOGRAM (TEE);  Surgeon: [name], MD;  Location: [facility] ENDOSCOPY;  Service: Cardiovascular;  Laterality: N/A;  . TEE WITHOUT CARDIOVERSION N/A 01/03/2018   Procedure: TRANSESOPHAGEAL ECHOCARDIOGRAM (TEE);  Surgeon: Dorothy Spark,  MD;  Location: [facility] ENDOSCOPY;  Service: Cardiovascular;  Laterality: N/A;    There were no vitals filed for this visit.   Subjective Assessment - 06/04/20 1803    Subjective Pt reports no new complaints. He notices that his balance and getting out of chairs is getting much easier.    Limitations Other (comment);House hold activities   Turning head while driving   Patient Stated Goals Pt states he would like to reduce his neck pain and be able to get off the floor easier.    Currently in Pain? No/denies    Pain Score 0-No pain    Pain Onset 1 to 4 weeks ago                             [facility] Adult PT Treatment/Exercise - 06/04/20 0001      Ambulation/Gait   Gait Pattern Decreased arm swing - left;Decreased step length - right;Decreased stride length;Decreased hip/knee flexion - right;Decreased hip/knee flexion - left;Lateral trunk lean to right;Wide base of support      Therapeutic Activites    Therapeutic Activities Other Therapeutic Activities    Other Therapeutic Activities half kneeling to stand transfers 3x with foam under L knee; bilat UE support; 3x with R  knee down      Exercises   Exercises Knee/Hip;Neck      Neck Exercises: Theraband   Rows 20 reps   paloff Semi tandem stance and NBOS each     Knee/Hip Exercises: Stretches   Other Knee/Hip Stretches standing calf stretch at step 30s 2x each      Knee/Hip Exercises: Standing   Functional Squat 5 reps;3 sets    Functional Squat Limitations 4x5 STS onto foam, no UE   low table height   Other Standing Knee Exercises standing march at counter 20x;   cone square stepping fwd, retro, and lateral 4x; slow and fast    Other Standing Knee Exercises NBOS on foam 30s 3x                  PT Education - 06/04/20 1803    Education Details POC, balance, exercise progression, transfer mechanics, transfer preparation, efficiency with movement, HEP frequency    Person(s) Educated Patient    Methods  Explanation;Demonstration;Handout    Comprehension Verbalized understanding;Returned demonstration            PT Short Term Goals - 05/23/20 1858      PT SHORT TERM GOAL #1   Title Pt will demonstrate independence with HEP in order to demonstrate synthesis of HEP, compliance, and PT education.    Time 3    Period Weeks    Status Partially Met    Target Date 05/14/20      PT SHORT TERM GOAL #2   Title Pt to demonstrate improvement of 5xSTS to be below 12 seconds in order tomeet stroke population cut off score and  demonstrate functional improvement in LE strength.    Time 4    Period Weeks    Status On-going             PT Long Term Goals - 05/23/20 1859      PT LONG TERM GOAL #1   Title Pt will demonstrate ability to perform stairs with reciprocal pattern and single UE support in order to demonstrate functional improvement with stair mobility.    Time 8    Period Weeks    Status Partially Met      PT LONG TERM GOAL #2   Title Pt will demonstrate full floor to stand transfer with solid surface and UE support in order to demonstrate safety and functional improvement in transfers for full independence at home.    Time 8    Period Weeks    Status On-going                 Plan - 06/04/20 1804    Clinical Impression Statement Pt continues to demonstrate improvement with dynamic balance, strength, and functional transfers. Pt is able to perform all dynamic movements and transfers with SBA. Pt only requires CGA with standing balance with UE perturbation on pliant surface. Pt also shows improvement in endurance with ability to perform 3x kneeling transfers each direction and STS from lower height with shorter rest breaks. Possibly trial double kneeling transfer at next session. Pt would benefit from continued skilled therapy in order to reduce neck pain and maximize mobility and strength for prevention of further functional decline.    Personal Factors and Comorbidities  Comorbidity 1;Fitness;Comorbidity 2;Age    Examination-Activity Limitations Stairs;Squat;Lift;Stand;Transfers    Examination-Participation Restrictions Community Activity;Other;Driving;Cleaning    Stability/Clinical Decision Making Stable/Uncomplicated    Rehab Potential Good    PT Frequency 2x / week  PT Duration 8 weeks    PT Treatment/Interventions ADLs/Self Care Home Management;Iontophoresis 87m/ml Dexamethasone;Moist Heat;Electrical Stimulation;Ultrasound;Gait training;Stair training;Functional mobility training;Therapeutic activities;Therapeutic exercise;Balance training;Neuromuscular re-education;Patient/family education;Manual techniques;Passive range of motion;Dry needling;Taping;Spinal Manipulations;Joint Manipulations    PT Next Visit Plan review HEP, kneeling transfers both sides, STS with foam from lower height, mini squat    Consulted and Agree with Plan of Care Patient           Patient will benefit from skilled therapeutic intervention in order to improve the following deficits and impairments:  Abnormal gait,Decreased balance,Decreased mobility,Decreased endurance,Difficulty walking,Decreased range of motion,Pain,Decreased activity tolerance,Decreased strength,Impaired flexibility  Visit Diagnosis: Cervicalgia  Generalized weakness     Problem List Patient Active Problem List   Diagnosis Date Noted  . Coagulation defect ([place] 10/12/2019  . Abnormal peripheral vision, left 01/21/2018  . Anemia, chronic disease   . Left hemiparesis ([place]   . 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 CKD (chronic kidney disease), stage III ([place]   . Peripheral edema   . Stroke (cerebrum) ([place] 01/05/2018  . History of gout   . AKI (acute kidney injury) ([facility]   . Stage 3 chronic kidney disease ([place]   . Anemia of chronic disease   . Left-sided weakness   . Osteoarthritis   . OSA (obstructive sleep apnea)   .  Noncompliance with CPAP treatment   . History of DVT (deep vein thrombosis)   . History of CVA (cerebrovascular accident)   . Dysphagia, post-stroke   . Sinus tachycardia   . Acute blood loss anemia   . Acute CVA (cerebrovascular accident) ([place] 01/01/2018  . Hypertensive urgency 01/01/2018  . CKD (chronic kidney disease) stage 2, GFR 60-89 ml/min 01/01/2018  . 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 Generalized weakness   . Sepsis ([place] 12/10/2016  . Bacteremia due to Streptococcus 12/10/2016  . Lactic acidosis 12/10/2016  . Elevated troponin 12/10/2016  . Renal insufficiency 12/10/2016  . Hypophosphatemia 12/10/2016  . Left leg swelling 12/10/2016  . Febrile illness 12/09/2016  . Febrile illness, acute   . OBESITY, UNSPECIFIED 05/31/2009  . Hyperlipidemia 05/30/2009  . Obstructive sleep apnea 05/30/2009  . Essential hypertension 05/30/2009  . CAD (coronary artery disease) 05/30/2009   ADaleen BoPT, DPT 06/04/20 6:17 PM   [place] [address]Phone: [phone]  Fax:  [phone] Name: Shane CORVINMRN: [id number] of Birth: [birth year]

## 2020-06-04 NOTE — Patient Instructions (Signed)
Access Code: [email or web address] URL: [email or web address] Date: 06/04/2020 Prepared by: Daleen Bo  Exercises Step Up - 1 x daily - 3-4 x weekly - 2 sets - 10 reps Lateral Step Up - 1 x daily - 3-4 x weekly - 3 sets - 10 reps Heel Toe Raises with Unilateral Counter Support - 1 x daily - 3-4 x weekly - 3 sets - 10 reps Heel Toe Raises with Counter Support - 1 x daily - 3-4 x weekly - 3 sets - 10 reps Standing March with Counter Support - 1 x daily - 3-4 x weekly - 3 sets - 10 reps Sit to Stand - 1 x daily - 3-4 x weekly - 2 sets - 10 reps Standing Gastroc Stretch on Foam 1/2 Roll - 2 x daily - 7 x weekly - 1 sets - 3 reps - 30 hold

## 2020-06-06 ENCOUNTER — Encounter: Payer: Medicare Other | Admitting: Physical Therapy

## 2020-06-06 NOTE — Progress Notes (Signed)
Carelink Summary Report / Loop Recorder 

## 2020-06-11 ENCOUNTER — Ambulatory Visit: Payer: Medicare Other | Admitting: Physical Therapy

## 2020-06-11 ENCOUNTER — Other Ambulatory Visit: Payer: Self-pay

## 2020-06-11 ENCOUNTER — Encounter: Payer: Self-pay | Admitting: Physical Therapy

## 2020-06-11 DIAGNOSIS — R262 Difficulty in walking, not elsewhere classified: Secondary | ICD-10-CM

## 2020-06-11 DIAGNOSIS — M542 Cervicalgia: Secondary | ICD-10-CM | POA: Diagnosis not present

## 2020-06-11 DIAGNOSIS — R531 Weakness: Secondary | ICD-10-CM

## 2020-06-11 NOTE — Therapy (Signed)
[facility] [address] [place], [place], [address] Phone: [phone]   Fax:  [phone]  Physical Therapy Treatment  Patient Details  Name: [name] MRN: [id number] Date of Birth: [birth year] No data recorded  Encounter Date: 06/11/2020   PT End of Session - 06/11/20 1612    Visit Number 12    Number of Visits 17    Date for PT Re-Evaluation 06/20/20    Authorization Type [employer] Medicare    PT Start Time 1515    PT Stop Time 1600    PT Time Calculation (min) 45 min    Equipment Utilized During Treatment Gait belt    Activity Tolerance Patient tolerated treatment well;No increased pain    Behavior During Therapy WFL for tasks assessed/performed           Past Medical History:  Diagnosis Date  . CAD (coronary artery disease)    s/p cath in 2005 and CABG x4  . Cataract   . DJD (degenerative joint disease)   . DJD (degenerative joint disease)   . Fluid retention    mild  . H/O: gout   . History of blood clots   . HTN (hypertension)   . Hyperlipidemia   . Hyperlipidemia   . Myocardial infarction ([place]) 2009  . Obesity    with reduction  . OSA (obstructive sleep apnea)    AHI 98/hr  . Renal insufficiency   . Sleep apnea   . Stroke [facility]) 2019    Past Surgical History:  Procedure Laterality Date  . CORONARY ARTERY BYPASS GRAFT  2005   LIMA to LAD, SVG to OM1 and OM 2, RCA.   [name] left inguinal hernia repair    . LOOP RECORDER INSERTION N/A 01/03/2018   Procedure: LOOP RECORDER INSERTION;  Surgeon: Evans Lance, MD;  Location: [facility];  Service: Cardiovascular;  Laterality: N/A;  . TEE WITHOUT CARDIOVERSION N/A 12/11/2016   Procedure: TRANSESOPHAGEAL ECHOCARDIOGRAM (TEE);  Surgeon: [name], MD;  Location: [facility] ENDOSCOPY;  Service: Cardiovascular;  Laterality: N/A;  . TEE WITHOUT CARDIOVERSION N/A 01/03/2018   Procedure: TRANSESOPHAGEAL ECHOCARDIOGRAM (TEE);  Surgeon: Dorothy Spark,  MD;  Location: [facility] ENDOSCOPY;  Service: Cardiovascular;  Laterality: N/A;    There were no vitals filed for this visit.   Subjective Assessment - 06/11/20 1523    Subjective Pt reports feeling stiffness after working/volunteering at his church while shoveling. No pain since last session. Pt continues to feel like he is making progress.    Limitations Other (comment);House hold activities   Turning head while driving   Patient Stated Goals Pt states he would like to reduce his neck pain and be able to get off the floor easier.    Currently in Pain? No/denies    Pain Score 0-No pain    Pain Onset 1 to 4 weeks ago                             [facility] Adult PT Treatment/Exercise - 06/11/20 0001      Ambulation/Gait   Gait Pattern Decreased arm swing - left;Decreased step length - right;Decreased stride length;Decreased hip/knee flexion - right;Decreased hip/knee flexion - left;Lateral trunk lean to right;Wide base of support      Therapeutic Activites    Therapeutic Activities Other Therapeutic Activities    Other Therapeutic Activities half kneeling<>stand transfers 4x with foam under L knee; bilat UE  support; 4x with R knee down; 2x each side double kneeling <>stand      Exercises   Exercises Knee/Hip;Neck      Neck Exercises: Theraband   Rows 20 reps each   paloff Semi tandem stance and NBOS     Knee/Hip Exercises: Stretches   Other Knee/Hip Stretches standing calf stretch at step 30s 2x each      Knee/Hip Exercises: Aerobic   Recumbent Bike L1 6 min      Knee/Hip Exercises: Standing   Functional Squat 5 reps;3 sets    Functional Squat Limitations STS onto foam, no UE   low table height   Other Standing Knee Exercises --    Other Standing Knee Exercises EC NBOS on foam 30s 3x                  PT Education - 06/11/20 1611    Education Details balance, exercise progression, transfer mechanics, transfer preparation, efficiency with movement, HEP  frequency    Person(s) Educated Patient    Methods Explanation;Demonstration    Comprehension Verbalized understanding;Returned demonstration            PT Short Term Goals - 05/23/20 1858      PT SHORT TERM GOAL #1   Title Pt will demonstrate independence with HEP in order to demonstrate synthesis of HEP, compliance, and PT education.    Time 3    Period Weeks    Status Partially Met    Target Date 05/14/20      PT SHORT TERM GOAL #2   Title Pt to demonstrate improvement of 5xSTS to be below 12 seconds in order tomeet stroke population cut off score and  demonstrate functional improvement in LE strength.    Time 4    Period Weeks    Status On-going             PT Long Term Goals - 05/23/20 1859      PT LONG TERM GOAL #1   Title Pt will demonstrate ability to perform stairs with reciprocal pattern and single UE support in order to demonstrate functional improvement with stair mobility.    Time 8    Period Weeks    Status Partially Met      PT LONG TERM GOAL #2   Title Pt will demonstrate full floor to stand transfer with solid surface and UE support in order to demonstrate safety and functional improvement in transfers for full independence at home.    Time 8    Period Weeks    Status On-going                 Plan - 06/11/20 1613    Clinical Impression Statement Pt progressing well with LE strength, balance, and floor to stand transfers at today's session though still has difficulty with pliant surface balance training. Pt tends to favor lateral and posterior postural sway when on Airex foam. Pt required CGA-minA with EC NBOS balance today. However, pt was able to progress floor to standing transfers from single kneeling to double kneeling. Pt was able to perform bilaterally supported UE stand <> kneel bilaterally at table with moderate cuing for mechanics, structure, and safety. Pt has most significant challenge with eccentric lowering as well as prepping rear  leg to go into kneeling or coming up into standing. Pt's single LE concentric strength has improved and he showed improved confidence with transfers. Progress to full floor to stand without use of external support as  tolerated.  Pt would benefit from continued skilled therapy in order to rmaximize mobility and strength for prevention of further functional decline.    Personal Factors and Comorbidities Comorbidity 1;Fitness;Comorbidity 2;Age    Examination-Activity Limitations Stairs;Squat;Lift;Stand;Transfers    Examination-Participation Restrictions Community Activity;Other;Driving;Cleaning    Stability/Clinical Decision Making Stable/Uncomplicated    Rehab Potential Good    PT Frequency 2x / week    PT Duration 8 weeks    PT Treatment/Interventions ADLs/Self Care Home Management;Iontophoresis 108m/ml Dexamethasone;Moist Heat;Electrical Stimulation;Ultrasound;Gait training;Stair training;Functional mobility training;Therapeutic activities;Therapeutic exercise;Balance training;Neuromuscular re-education;Patient/family education;Manual techniques;Passive range of motion;Dry needling;Taping;Spinal Manipulations;Joint Manipulations    PT Next Visit Plan review HEP, kneeling transfers both sides, STS with foam from lower height, mini squat    Consulted and Agree with Plan of Care Patient           Patient will benefit from skilled therapeutic intervention in order to improve the following deficits and impairments:  Abnormal gait,Decreased balance,Decreased mobility,Decreased endurance,Difficulty walking,Decreased range of motion,Pain,Decreased activity tolerance,Decreased strength,Impaired flexibility  Visit Diagnosis: Cervicalgia  Generalized weakness  Difficulty walking     Problem List Patient Active Problem List   Diagnosis Date Noted  . Coagulation defect (HL[place] 10/12/2019  . Abnormal peripheral vision, left 01/21/2018  . Anemia, chronic disease   . Left hemiparesis ([place]   . Acute  ischemic cerebrovascular accident (CVA) involving right middle cerebral artery territory ([facility]   . CKD (chronic kidney disease), stage III ([place]   . Peripheral edema   . Stroke (cerebrum) ([place] 01/05/2018  . History of gout   . AKI (acute kidney injury) ([place]   . Stage 3 chronic kidney disease (HReding[place]   . Anemia of chronic disease   . Left-sided weakness   . Osteoarthritis   . OSA (obstructive sleep apnea)   . Noncompliance with CPAP treatment   . History of DVT (deep vein thrombosis)   . History of CVA (cerebrovascular accident)   . Dysphagia, post-stroke   . Sinus tachycardia   . Acute blood loss anemia   . Acute CVA (cerebrovascular accident) ([place] 01/01/2018  . Hypertensive urgency 01/01/2018  . CKD (chronic kidney disease) stage 2, GFR 60-89 ml/min 01/01/2018  . 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 Generalized weakness   . Sepsis ([place] 12/10/2016  . Bacteremia due to Streptococcus 12/10/2016  . Lactic acidosis 12/10/2016  . Elevated troponin 12/10/2016  . Renal insufficiency 12/10/2016  . Hypophosphatemia 12/10/2016  . Left leg swelling 12/10/2016  . Febrile illness 12/09/2016  . Febrile illness, acute   . OBESITY, UNSPECIFIED 05/31/2009  . Hyperlipidemia 05/30/2009  . Obstructive sleep apnea 05/30/2009  . Essential hypertension 05/30/2009  . CAD (coronary artery disease) 05/30/2009   ADaleen BoPT, DPT 06/11/20 4:22 PM   [place] [address]Phone: [phone]  Fax:  [phone] Name: ISAMIEL PEELMRN: [id number] of Birth: [birth year]

## 2020-06-13 ENCOUNTER — Encounter: Payer: Medicare Other | Admitting: Physical Therapy

## 2020-06-18 ENCOUNTER — Encounter: Payer: Self-pay | Admitting: Physical Therapy

## 2020-06-18 ENCOUNTER — Other Ambulatory Visit: Payer: Self-pay

## 2020-06-18 ENCOUNTER — Ambulatory Visit: Payer: Medicare Other | Admitting: Physical Therapy

## 2020-06-18 DIAGNOSIS — M542 Cervicalgia: Secondary | ICD-10-CM

## 2020-06-18 DIAGNOSIS — R531 Weakness: Secondary | ICD-10-CM

## 2020-06-18 DIAGNOSIS — R262 Difficulty in walking, not elsewhere classified: Secondary | ICD-10-CM

## 2020-06-18 NOTE — Patient Instructions (Signed)
Access Code: [email or web address] URL: [email or web address] Date: 06/18/2020 Prepared by: Daleen Bo  Exercises Step Up - 1 x daily - 3-4 x weekly - 2 sets - 10 reps Lateral Step Up - 1 x daily - 3-4 x weekly - 3 sets - 10 reps Heel Toe Raises with Unilateral Counter Support - 1 x daily - 3-4 x weekly - 3 sets - 10 reps Heel Toe Raises with Counter Support - 1 x daily - 3-4 x weekly - 3 sets - 10 reps Standing March with Counter Support - 1 x daily - 3-4 x weekly - 3 sets - 10 reps Sit to Stand - 1 x daily - 3-4 x weekly - 2 sets - 10 reps Standing Gastroc Stretch on Foam 1/2 Roll - 2 x daily - 7 x weekly - 1 sets - 3 reps - 30 hold Supine Bridge - 1 x daily - 3-4 x weekly - 2 sets - 10 reps

## 2020-06-18 NOTE — Therapy (Signed)
[facility] [address] [place], [place], [address] Phone: [phone]   Fax:  [phone]  Physical Therapy Discharge   Patient Details  Name: [name] MRN: [id number] Date of Birth: May 15, 1940 No data recorded  Encounter Date: 06/18/2020   PT End of Session - 06/18/20 1600    Visit Number 13    Number of Visits 17    Date for PT Re-Evaluation 06/20/20    Authorization Type [employer] Medicare    PT Start Time 7106    PT Stop Time 2694    PT Time Calculation (min) 39 min    Activity Tolerance Patient tolerated treatment well;No increased pain    Behavior During Therapy WFL for tasks assessed/performed           Past Medical History:  Diagnosis Date  . CAD (coronary artery disease)    s/p cath in 2005 and CABG x4  . Cataract   . DJD (degenerative joint disease)   . DJD (degenerative joint disease)   . Fluid retention    mild  . H/O: gout   . History of blood clots   . HTN (hypertension)   . Hyperlipidemia   . Hyperlipidemia   . Myocardial infarction ([place]) 2009  . Obesity    with reduction  . OSA (obstructive sleep apnea)    AHI 98/hr  . Renal insufficiency   . Sleep apnea   . Stroke [facility]) 2019    Past Surgical History:  Procedure Laterality Date  . CORONARY ARTERY BYPASS GRAFT  2005   LIMA to LAD, SVG to OM1 and OM 2, RCA.   [name] left inguinal hernia repair    . LOOP RECORDER INSERTION N/A 01/03/2018   Procedure: LOOP RECORDER INSERTION;  Surgeon: Evans Lance, MD;  Location: [place] [facility];  Service: Cardiovascular;  Laterality: N/A;  . TEE WITHOUT CARDIOVERSION N/A 12/11/2016   Procedure: TRANSESOPHAGEAL ECHOCARDIOGRAM (TEE);  Surgeon: [name], MD;  Location: [facility] ENDOSCOPY;  Service: Cardiovascular;  Laterality: N/A;  . TEE WITHOUT CARDIOVERSION N/A 01/03/2018   Procedure: TRANSESOPHAGEAL ECHOCARDIOGRAM (TEE);  Surgeon: Dorothy Spark, MD;  Location: [facility] ENDOSCOPY;  Service:  Cardiovascular;  Laterality: N/A;    There were no vitals filed for this visit.   Subjective Assessment - 06/18/20 1514    Subjective Pt states he is "feeling good." He reports stumbling his backyard in hip height grass and tripped over a large stick as he was cleaning up his overgrown yard. Pt reports that the yard is bumpy with very overgrown grass. He states he was able to get to a small bar/box in his yard and push himself into standing. "I was able to pop right back up." Pt reports no injuries. Pt states he has not had any instances of falls or near stumbles while in his home or out in community. Pt states he he has met his goals planned with therapy.        Patient Stated Goals Pt states he would like to reduce his neck pain and be able to get off the floor easier.    Currently in Pain? No/denies    Pain Score 0-No pain                  [facility] PT Assessment - 06/18/20 0001      Assessment   Medical Diagnosis Neck pain and LE weakness    Hand Dominance Right    Prior Therapy HH, OT, PT  post CVA      Precautions   Precautions None      Restrictions   Weight Bearing Restrictions No      Home Environment   Living Environment Private residence      Prior Function   Level of Independence Independent    Leisure bowling, antiquing      Cognition   Overall Cognitive Status Within Functional Limits for tasks assessed      Observation/Other Assessments   Neck Disability Index  0%    Lower Extremity Functional Scale  100%      Functional Tests   Functional tests Sit to Stand;Floor to Stand      Sit to Stand   Comments WNL, 5XSTS 11.3s      Floor to Stand   Comments able to perform full supine to stand with UE support from stable surface                         [facility] Adult PT Treatment/Exercise - 06/18/20 0001      Transfers   Five time sit to stand comments  11.3s      Ambulation/Gait   Gait Pattern Decreased arm swing - left;Decreased step length  - right;Decreased stride length;Decreased hip/knee flexion - right;Decreased hip/knee flexion - left      Therapeutic Activites    Therapeutic Activities Other Therapeutic Activities    Other Therapeutic Activities half kneeling to stand transfers 3x with foam under L knee; bilat UE support; 3x with R knee down; 2x each side double kneeling to stand; supine to stand floor transfer      Exercises   Exercises Knee/Hip;Neck      Neck Exercises: Theraband   Rows 20 reps   paloff Semi tandem stance and NBOS     Knee/Hip Exercises: Stretches   Other Knee/Hip Stretches standing calf stretch at step 30s 2x each      Knee/Hip Exercises: Aerobic   Recumbent Bike L1 6 min      Knee/Hip Exercises: Standing   Functional Squat 5 reps;3 sets    Functional Squat Limitations STS onto foam, no UE   low table height                     PT Education - 06/18/20 1559    Education Details balance, exercise progression, transfer mechanics, transfer preparation, efficiency with movement, final HEP, POC, D/C    Person(s) Educated Patient    Methods Explanation;Demonstration;Tactile cues;Verbal cues;Handout    Comprehension Verbalized understanding;Returned demonstration            PT Short Term Goals - 06/18/20 1600      PT SHORT TERM GOAL #1   Title Pt will demonstrate independence with HEP in order to demonstrate synthesis of HEP, compliance, and PT education.    Time 3    Period Weeks    Status Achieved    Target Date 05/14/20      PT SHORT TERM GOAL #2   Title Pt to demonstrate improvement of 5xSTS to be below 12 seconds in order tomeet stroke population cut off score and  demonstrate functional improvement in LE strength.    Time 4    Period Weeks    Status Achieved             PT Long Term Goals - 06/18/20 1600      PT LONG TERM GOAL #1   Title Pt will demonstrate ability  to perform stairs with reciprocal pattern and single UE support in order to demonstrate functional  improvement with stair mobility.    Time 8    Period Weeks    Status Partially Met      PT LONG TERM GOAL #2   Title Pt will demonstrate full floor to stand transfer with solid surface and UE support in order to demonstrate safety and functional improvement in transfers for full independence at home.    Time 8    Period Weeks    Status Achieved                 Plan - 06/18/20 1602    Clinical Impression Statement Pt demonstrates significant improvement with functional transfers, balance, and LE strength. Pt has reached functional plateau with skilled therapy. Pt has also met therapy goals as well as self stated goal of floor to stand transfer. Pt was able to demonstrate full supine floor to stand transfers without cuing or assistance. Although pt reports recent fall while in the yard, pt objectively demonstrates good balance and ability to safely return to full standing without assistance. According to pt report, circumstances around that stumble were outside of his regular routine due to the overgrown and uneven yard environment. Pt is able to clinically demonstrate good safety awareness, improved balance reactions, and ability to self right. Pt has good understanding of final HEP. D/C this episode of care at this time.    Personal Factors and Comorbidities Comorbidity 1;Fitness;Comorbidity 2;Age    Examination-Activity Limitations Stairs;Squat;Lift;Stand;Transfers    Examination-Participation Restrictions Community Activity;Other;Driving;Cleaning    Stability/Clinical Decision Making Stable/Uncomplicated    Rehab Potential Good    PT Frequency 2x / week    PT Duration 8 weeks    PT Treatment/Interventions ADLs/Self Care Home Management;Iontophoresis 60m/ml Dexamethasone;Moist Heat;Electrical Stimulation;Ultrasound;Gait training;Stair training;Functional mobility training;Therapeutic activities;Therapeutic exercise;Balance training;Neuromuscular re-education;Patient/family  education;Manual techniques;Passive range of motion;Dry needling;Taping;Spinal Manipulations;Joint Manipulations    PT Next Visit Plan review HEP, kneeling transfers both sides, STS with foam from lower height, mini squat    Consulted and Agree with Plan of Care Patient           Patient will benefit from skilled therapeutic intervention in order to improve the following deficits and impairments:  Abnormal gait,Decreased balance,Decreased mobility,Decreased endurance,Difficulty walking,Decreased range of motion,Pain,Decreased activity tolerance,Decreased strength,Impaired flexibility  Visit Diagnosis: Generalized weakness  Difficulty walking  Cervicalgia     Problem List Patient Active Problem List   Diagnosis Date Noted  . Coagulation defect ([place] 10/12/2019  . Abnormal peripheral vision, left 01/21/2018  . Anemia, chronic disease   . Left hemiparesis ([place]   . Acute ischemic cerebrovascular accident (CVA) involving right middle cerebral artery territory ([facility]   . CKD (chronic kidney disease), stage III ([place]   . Peripheral edema   . Stroke (cerebrum) ([place] 01/05/2018  . History of gout   . AKI (acute kidney injury) ([facility]   . Stage 3 chronic kidney disease ([place]   . Anemia of chronic disease   . Left-sided weakness   . Osteoarthritis   . OSA (obstructive sleep apnea)   . Noncompliance with CPAP treatment   . History of DVT (deep vein thrombosis)   . History of CVA (cerebrovascular accident)   . Dysphagia, post-stroke   . Sinus tachycardia   . Acute blood loss anemia   . Acute CVA (cerebrovascular accident) ([place] 01/01/2018  . Hypertensive urgency 01/01/2018  . CKD (chronic kidney disease) stage 2, GFR 60-89 ml/min 01/01/2018  .  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 Generalized weakness   . Sepsis ([place]) 12/10/2016  . Bacteremia due to Streptococcus 12/10/2016  . Lactic acidosis 12/10/2016  . Elevated troponin 12/10/2016  . Renal insufficiency  12/10/2016  . Hypophosphatemia 12/10/2016  . Left leg swelling 12/10/2016  . Febrile illness 12/09/2016  . Febrile illness, acute   . OBESITY, UNSPECIFIED 05/31/2009  . Hyperlipidemia 05/30/2009  . Obstructive sleep apnea 05/30/2009  . Essential hypertension 05/30/2009  . CAD (coronary artery disease) 05/30/2009   Daleen Bo PT, DPT 06/18/20 5:24 PM    [place] [place], [place], [address] Phone: [phone]   Fax:  [phone]  Name: [name] MRN: [id number] Date of Birth: 1940/07/19   PHYSICAL THERAPY DISCHARGE SUMMARY  Visits from Start of Care: 13  Plan: Patient agrees to discharge.  Patient goals were partially met. Patient is being discharged due to being pleased with the current functional level.  ?????

## 2020-06-20 ENCOUNTER — Encounter: Payer: Medicare Other | Admitting: Physical Therapy

## 2020-06-24 ENCOUNTER — Ambulatory Visit (INDEPENDENT_AMBULATORY_CARE_PROVIDER_SITE_OTHER): Payer: Medicare Other

## 2020-06-24 DIAGNOSIS — I639 Cerebral infarction, unspecified: Secondary | ICD-10-CM | POA: Diagnosis not present

## 2020-06-25 LAB — CUP PACEART REMOTE DEVICE CHECK
Date Time Interrogation Session: 20220425013112
Implantable Pulse Generator Implant Date: 20191104

## 2020-07-01 ENCOUNTER — Other Ambulatory Visit: Payer: Self-pay | Admitting: Internal Medicine

## 2020-07-02 NOTE — Telephone Encounter (Signed)
Rx(s) sent to pharmacy electronically.  

## 2020-07-04 ENCOUNTER — Ambulatory Visit: Payer: Medicare Other | Admitting: Family Medicine

## 2020-07-11 NOTE — Progress Notes (Signed)
Carelink Summary Report / Loop Recorder 

## 2020-07-15 ENCOUNTER — Ambulatory Visit: Payer: Medicare Other | Admitting: Podiatry

## 2020-07-25 ENCOUNTER — Other Ambulatory Visit (HOSPITAL_COMMUNITY): Payer: Self-pay

## 2020-07-29 LAB — CUP PACEART REMOTE DEVICE CHECK
Date Time Interrogation Session: 20220528013502
Implantable Pulse Generator Implant Date: 20191104

## 2020-07-30 ENCOUNTER — Ambulatory Visit (INDEPENDENT_AMBULATORY_CARE_PROVIDER_SITE_OTHER): Payer: Medicare Other

## 2020-07-30 DIAGNOSIS — I63511 Cerebral infarction due to unspecified occlusion or stenosis of right middle cerebral artery: Secondary | ICD-10-CM | POA: Diagnosis not present

## 2020-08-21 NOTE — Progress Notes (Signed)
Carelink Summary Report / Loop Recorder 

## 2020-08-30 ENCOUNTER — Ambulatory Visit (INDEPENDENT_AMBULATORY_CARE_PROVIDER_SITE_OTHER): Payer: Medicare Other

## 2020-08-30 DIAGNOSIS — I63511 Cerebral infarction due to unspecified occlusion or stenosis of right middle cerebral artery: Secondary | ICD-10-CM | POA: Diagnosis not present

## 2020-09-02 LAB — CUP PACEART REMOTE DEVICE CHECK
Date Time Interrogation Session: 20220630013917
Implantable Pulse Generator Implant Date: 20191104

## 2020-09-14 ENCOUNTER — Other Ambulatory Visit: Payer: Self-pay | Admitting: Internal Medicine

## 2020-09-16 ENCOUNTER — Other Ambulatory Visit: Payer: Self-pay

## 2020-09-16 DIAGNOSIS — I10 Essential (primary) hypertension: Secondary | ICD-10-CM

## 2020-09-16 NOTE — Telephone Encounter (Signed)
14m 113.4kg, scr 1.57 02/10/19 (OUTDATED), lovw/hilty 11/03/19. Will send one month with note on bottle to get labs asap

## 2020-09-18 NOTE — Progress Notes (Signed)
Carelink Summary Report / Loop Recorder 

## 2020-10-01 LAB — CUP PACEART REMOTE DEVICE CHECK
Date Time Interrogation Session: 20220802015212
Implantable Pulse Generator Implant Date: 20191104

## 2020-10-02 ENCOUNTER — Ambulatory Visit (INDEPENDENT_AMBULATORY_CARE_PROVIDER_SITE_OTHER): Payer: Medicare Other

## 2020-10-02 DIAGNOSIS — I639 Cerebral infarction, unspecified: Secondary | ICD-10-CM | POA: Diagnosis not present

## 2020-10-14 ENCOUNTER — Telehealth: Payer: Self-pay

## 2020-10-14 ENCOUNTER — Other Ambulatory Visit: Payer: Self-pay

## 2020-10-14 MED ORDER — PANTOPRAZOLE SODIUM 40 MG PO TBEC: 40.0000 mg | DELAYED_RELEASE_TABLET | Freq: Every day | ORAL | 0 refills | Status: DC

## 2020-10-14 NOTE — Telephone Encounter (Addendum)
...   Encourage patient to contact the pharmacy for refills or they can request refills through [facility]: 04/16/20  NEXT APPOINTMENT DATE: 10/18/20  MEDICATION:  Pantoprazole  '40mg'$   Is the patient out of medication?   PHARMACY: [facility]  Let patient know to contact pharmacy at the end of the day to make sure medication is ready.  Please notify patient to allow 48-72 hours to process  CLINICAL FILLS OUT ALL BELOW:   LAST REFILL:  QTY:  REFILL DATE:    OTHER COMMENTS:   Pt is almost out.   Okay for refill?  Please advise

## 2020-10-14 NOTE — Telephone Encounter (Signed)
Rx sent in

## 2020-10-18 ENCOUNTER — Other Ambulatory Visit: Payer: Self-pay

## 2020-10-18 ENCOUNTER — Encounter: Payer: Self-pay | Admitting: Physician Assistant

## 2020-10-18 ENCOUNTER — Ambulatory Visit (INDEPENDENT_AMBULATORY_CARE_PROVIDER_SITE_OTHER): Payer: Medicare Other | Admitting: Physician Assistant

## 2020-10-18 VITALS — BP 120/77 | HR 78 | Temp 98.0°F | Ht 71.0 in | Wt 241.8 lb

## 2020-10-18 DIAGNOSIS — I25709 Atherosclerosis of coronary artery bypass graft(s), unspecified, with unspecified angina pectoris: Secondary | ICD-10-CM | POA: Diagnosis not present

## 2020-10-18 DIAGNOSIS — N183 Chronic kidney disease, stage 3 unspecified: Secondary | ICD-10-CM | POA: Diagnosis not present

## 2020-10-18 DIAGNOSIS — Z8673 Personal history of transient ischemic attack (TIA), and cerebral infarction without residual deficits: Secondary | ICD-10-CM | POA: Diagnosis not present

## 2020-10-18 DIAGNOSIS — D689 Coagulation defect, unspecified: Secondary | ICD-10-CM | POA: Diagnosis not present

## 2020-10-18 LAB — CBC WITH DIFFERENTIAL/PLATELET
Basophils Absolute: 0 10*3/uL (ref 0.0–0.1)
Basophils Relative: 0.5 % (ref 0.0–3.0)
Eosinophils Absolute: 0.1 10*3/uL (ref 0.0–0.7)
Eosinophils Relative: 1 % (ref 0.0–5.0)
HCT: 39.3 % (ref 39.0–52.0)
Hemoglobin: 12.7 g/dL — ABNORMAL LOW (ref 13.0–17.0)
Lymphocytes Relative: 17.5 % (ref 12.0–46.0)
Lymphs Abs: 1 10*3/uL (ref 0.7–4.0)
MCHC: 32.2 g/dL (ref 30.0–36.0)
MCV: 89.9 fl (ref 78.0–100.0)
Monocytes Absolute: 1.3 10*3/uL — ABNORMAL HIGH (ref 0.1–1.0)
Monocytes Relative: 23.8 % — ABNORMAL HIGH (ref 3.0–12.0)
Neutro Abs: 3.2 10*3/uL (ref 1.4–7.7)
Neutrophils Relative %: 57.2 % (ref 43.0–77.0)
Platelets: 166 10*3/uL (ref 150.0–400.0)
RBC: 4.38 Mil/uL (ref 4.22–5.81)
RDW: 14.9 % (ref 11.5–15.5)
WBC: 5.6 10*3/uL (ref 4.0–10.5)

## 2020-10-18 LAB — COMPREHENSIVE METABOLIC PANEL
ALT: 10 U/L (ref 0–53)
AST: 19 U/L (ref 0–37)
Albumin: 3.7 g/dL (ref 3.5–5.2)
Alkaline Phosphatase: 48 U/L (ref 39–117)
BUN: 17 mg/dL (ref 6–23)
CO2: 25 mEq/L (ref 19–32)
Calcium: 9.5 mg/dL (ref 8.4–10.5)
Chloride: 108 mEq/L (ref 96–112)
Creatinine, Ser: 1.62 mg/dL — ABNORMAL HIGH (ref 0.40–1.50)
GFR: 40.45 mL/min — ABNORMAL LOW (ref >60.00)
Glucose, Bld: 97 mg/dL (ref 70–99)
Potassium: 4.4 mEq/L (ref 3.5–5.1)
Sodium: 139 mEq/L (ref 135–145)
Total Bilirubin: 0.4 mg/dL (ref 0.2–1.2)
Total Protein: 7.1 g/dL (ref 6.0–8.3)

## 2020-10-18 LAB — LIPID PANEL
Cholesterol: 100 mg/dL (ref 0–200)
HDL: 43.5 mg/dL (ref >39.00)
LDL Cholesterol: 44 mg/dL (ref 0–99)
NonHDL: 56.38
Total CHOL/HDL Ratio: 2
Triglycerides: 61 mg/dL (ref 0.0–149.0)
VLDL: 12.2 mg/dL (ref 0.0–40.0)

## 2020-10-18 NOTE — Patient Instructions (Signed)
Good to see you again today! Labs today, will call with results. Your medications were reconciled. I have also placed order for chronic care management to assist with medications. Your blood pressure looks great! Keep up good work with physical activity as you are able.  See you back in 6 months or sooner if needed.

## 2020-10-18 NOTE — Progress Notes (Signed)
Established Patient Office Visit  Subjective:  Patient ID: [name], male    DOB: [birth year]  Age: 80 y.o. MRN: [id number]  CC: No chief complaint on file.   HPI [name] presents for 6 month recheck. Denies any changes with his medications since last visit. States he is doing well, appetite has been good, and he has been busy working outside this summer.   Completed course of PT with Daleen Bo for his neck pain, difficulty in gait, and overall weakness, and says that went very well. He is able to get off of the floor and feels more confident with his movements because of PT. He also says his neck is feeling much better.  He says he is up to date with his vision and dental exams. He has not had labs done in awhile and would like to update those.   Past Medical History:  Diagnosis Date   CAD (coronary artery disease)    s/p cath in 2005 and CABG x4   Cataract    DJD (degenerative joint disease)    DJD (degenerative joint disease)    Fluid retention    mild   H/O: gout    History of blood clots    HTN (hypertension)    Hyperlipidemia    Hyperlipidemia    Myocardial infarction ([place]) 2009   Obesity    with reduction   OSA (obstructive sleep apnea)    AHI 98/hr   Renal insufficiency    Sleep apnea    Stroke [facility]) 2019    Past Surgical History:  Procedure Laterality Date   CORONARY ARTERY BYPASS GRAFT  2005   LIMA to LAD, SVG to OM1 and OM 2, RCA.    left inguinal hernia repair     LOOP RECORDER INSERTION N/A 01/03/2018   Procedure: LOOP RECORDER INSERTION;  Surgeon: Evans Lance, MD;  Location: [place] [facility];  Service: Cardiovascular;  Laterality: N/A;   TEE WITHOUT CARDIOVERSION N/A 12/11/2016   Procedure: TRANSESOPHAGEAL ECHOCARDIOGRAM (TEE);  Surgeon: [name], MD;  Location: [facility] ENDOSCOPY;  Service: Cardiovascular;  Laterality: N/A;   TEE WITHOUT CARDIOVERSION N/A 01/03/2018   Procedure: TRANSESOPHAGEAL ECHOCARDIOGRAM (TEE);  Surgeon: Dorothy Spark, MD;  Location: [facility] ENDOSCOPY;  Service: Cardiovascular;  Laterality: N/A;    Family History  Problem Relation Age of Onset   Gout Mother    Stroke Mother    Coronary artery disease Mother    Hypertension Mother    Arthritis Mother    Coronary artery disease Father    Arthritis Father    Gout Father    Stroke Father    Hypertension Father    Coronary artery disease Brother    Arthritis Brother    Gout Brother    Stroke Brother    Hypertension Brother    Coronary artery disease Brother    Arthritis Brother    Gout Brother    Stroke Brother    Hypertension Brother    Coronary artery disease Other        significant in multiple family members    Social History   Socioeconomic History   Marital status: Married    Spouse name: Richmond Campbell   Number of children: Not on file   Years of education: Not on file   Highest education level: Not on file  Occupational History   Not on file  Tobacco Use   Smoking status: Former    Types: Cigars   Smokeless  tobacco: Never   Tobacco comments:    used to smoke cigars, 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, none since  Vaping Use   Vaping Use: Never used  Substance and Sexual Activity   Alcohol use: No   Drug use: No   Sexual activity: Not on file  Other Topics Concern   Not on file  Social History Narrative   Not on file   Social Determinants of Health   Financial Resource Strain: Not on file  Food Insecurity: Not on file  Transportation Needs: Not on file  Physical Activity: Not on file  Stress: Not on file  Social Connections: Not on file  Intimate Partner Violence: Not on file    Outpatient Medications Prior to Visit  Medication Sig Dispense Refill   acetaminophen (TYLENOL) 325 MG tablet Take 1-2 tablets (325-650 mg total) by mouth every 4 (four) hours as needed for mild pain.     allopurinol (ZYLOPRIM) 100 MG tablet Take 200 mg by mouth daily.      amLODipine (NORVASC) 5 MG tablet Take 1 tablet by mouth once daily  90 tablet 3   apixaban (ELIQUIS) 5 MG TABS tablet Take 1 tablet by mouth twice daily. LABS NEEDED FOR FURTHER REFILLS 60 tablet 0   COVID-19 mRNA vaccine, Pfizer, 30 MCG/0.3ML injection INJECT AS DIRECTED .3 mL 0   furosemide (LASIX) 20 MG tablet Take 1/2 (one-half) tablet by mouth once daily 30 tablet 3   Menthol-Methyl Salicylate (MUSCLE RUB) 10-15 % CREA Apply 1 application topically 4 (four) times daily -  with meals and at bedtime. To neck and shoulders  0   metoprolol tartrate (LOPRESSOR) 25 MG tablet Take 1/2 (one-half) tablet by mouth twice daily 60 tablet 3   montelukast (SINGULAIR) 10 MG tablet Take 10 mg by mouth at bedtime.     Multiple Vitamin (MULTIVITAMIN) tablet Take 1 tablet by mouth daily. GNC multi + Omega 3     niacin (NIASPAN) 500 MG CR tablet Take 500 mg by mouth at bedtime.     pantoprazole (PROTONIX) 40 MG tablet Take 1 tablet (40 mg total) by mouth daily. 90 tablet 0   potassium chloride (KLOR-CON) 10 MEQ tablet Take 10 mEq by mouth daily.     rosuvastatin (CRESTOR) 10 MG tablet Take 10 mg by mouth at bedtime.     tamsulosin (FLOMAX) 0.4 MG CAPS capsule Take 1 capsule by mouth at bedtime.     niacin (NIASPAN) 1000 MG CR tablet      Olmesartan Medoxomil (BENICAR PO)      No facility-administered medications prior to visit.    Allergies  Allergen Reactions   Ramipril Cough    ROS Review of Systems No complaints today   Objective:    Physical Exam Vitals and nursing note reviewed.  Constitutional:      General: He is not in acute distress.    Appearance: Normal appearance. He is not toxic-appearing.  HENT:     Head: Normocephalic and atraumatic.     Right Ear: Tympanic membrane, ear canal and external ear normal.     Left Ear: Tympanic membrane, ear canal and external ear normal.     Nose: Nose normal.     Mouth/Throat:     Mouth: Mucous membranes are moist.     Pharynx: Oropharynx is clear.  Eyes:     Extraocular Movements: Extraocular movements  intact.     Conjunctiva/sclera: Conjunctivae normal.     Pupils: Pupils are equal, round,  and reactive to light.  Cardiovascular:     Rate and Rhythm: Normal rate and regular rhythm.     Pulses: Normal pulses.     Heart sounds: Normal heart sounds.  Pulmonary:     Effort: Pulmonary effort is normal.     Breath sounds: Normal breath sounds.  Abdominal:     General: Abdomen is flat. Bowel sounds are normal.     Palpations: Abdomen is soft.     Tenderness: There is no abdominal tenderness.  Musculoskeletal:        General: Normal range of motion.     Cervical back: Normal range of motion and neck supple.  Skin:    General: Skin is warm and dry.  Neurological:     General: No focal deficit present.     Mental Status: He is alert and oriented to person, place, and time.     Cranial Nerves: No cranial nerve deficit.     Motor: No weakness.     Gait: Gait abnormal (slow, cautious).  Psychiatric:        Mood and Affect: Mood normal.        Behavior: Behavior normal.    BP 120/77   Pulse 78   Temp 98 F (36.7 C) (Temporal)   Ht '5\' 11"'$  (1.803 m)   Wt 241 lb 12.8 oz (109.7 kg)   SpO2 99%   BMI 33.72 kg/m  Wt Readings from Last 3 Encounters:  10/18/20 241 lb 12.8 oz (109.7 kg)  04/16/20 250 lb (113.4 kg)  11/03/19 250 lb 3.2 oz (113.5 kg)     Health Maintenance Due  Topic Date Due   Hepatitis C Screening  Never done   TETANUS/TDAP  Never done   Zoster Vaccines- Shingrix (1 of 2) Never done   PNA vac Low Risk Adult (1 of 2 - PCV13) Never done   COVID-19 Vaccine (2 - Pfizer series) 01/10/2020   INFLUENZA VACCINE  09/30/2020    There are no preventive care reminders to display for this patient.  Lab Results  Component Value Date   TSH 1.550 09/07/2017   Lab Results  Component Value Date   WBC 5.4 02/10/2019   HGB 13.7 02/10/2019   HCT 43.1 02/10/2019   MCV 88.3 02/10/2019   PLT 193 02/10/2019   Lab Results  Component Value Date   NA 140 02/10/2019   K 3.9  02/10/2019   CO2 25 02/10/2019   GLUCOSE 116 (H) 02/10/2019   BUN 23 02/10/2019   CREATININE 1.57 (H) 02/10/2019   BILITOT 0.4 02/10/2019   ALKPHOS 46 02/10/2019   AST 27 02/10/2019   ALT 19 02/10/2019   PROT 7.6 02/10/2019   ALBUMIN 3.4 (L) 02/10/2019   CALCIUM 9.9 02/10/2019   ANIONGAP 10 02/10/2019   Lab Results  Component Value Date   CHOL 127 01/01/2018   Lab Results  Component Value Date   HDL 39 (L) 01/01/2018   Lab Results  Component Value Date   LDLCALC 73 01/01/2018   Lab Results  Component Value Date   TRIG 73 01/01/2018   Lab Results  Component Value Date   CHOLHDL 3.3 01/01/2018   Lab Results  Component Value Date   HGBA1C 5.8 (H) 01/01/2018      Assessment & Plan:   Problem List Items Addressed This Visit       Cardiovascular and Mediastinum   CAD (coronary artery disease)   Relevant Orders   CBC with Differential/Platelet   Comprehensive  metabolic panel   Lipid panel   AMB Referral to [facility] Coordinaton     Genitourinary   Stage 3 chronic kidney disease ([place])   Relevant Orders   CBC with Differential/Platelet   Comprehensive metabolic panel   Lipid panel   AMB Referral to [facility]     Hematopoietic and Hemostatic   Coagulation defect ([place])   Relevant Orders   AMB Referral to [facility]     Other   History of CVA (cerebrovascular accident) - Primary   Relevant Orders   CBC with Differential/Platelet   Comprehensive metabolic panel   Lipid panel   AMB Referral to [place]    No orders of the defined types were placed in this encounter.   Follow-up: Return in about 6 months (around 04/20/2021) for med check and labs .   PLAN: -He brought his medications with him to his appointment today and this was reconciled in our electronic system. -I think that he would benefit from CCM program and have placed an order for this today. -We will check basic labs today and adjust  medications accordingly if needed -His blood pressure is looking great and he was encouraged to keep up the physical activity as he is able. -He will continue follow-up with cardiologist for pacemaker checks and regular follow-up as scheduled  Eleanora Guinyard M Aerielle [place], PA-C

## 2020-10-29 NOTE — Progress Notes (Signed)
Carelink Summary Report / Loop Recorder 

## 2020-10-30 ENCOUNTER — Other Ambulatory Visit: Payer: Self-pay | Admitting: Internal Medicine

## 2020-10-30 NOTE — Telephone Encounter (Signed)
Prescription refill request for Eliquis received. Last office visit:HILTY 11/03/19 Scr:1.62 10/18/20 Age: 86M Weight:109.7KG

## 2020-10-31 ENCOUNTER — Other Ambulatory Visit: Payer: Self-pay

## 2020-10-31 MED ORDER — POTASSIUM CHLORIDE CRYS ER 10 MEQ PO TBCR: 10.0000 meq | EXTENDED_RELEASE_TABLET | Freq: Every day | ORAL | 1 refills | Status: DC

## 2020-11-05 ENCOUNTER — Ambulatory Visit (INDEPENDENT_AMBULATORY_CARE_PROVIDER_SITE_OTHER): Payer: Medicare Other

## 2020-11-05 DIAGNOSIS — I639 Cerebral infarction, unspecified: Secondary | ICD-10-CM | POA: Diagnosis not present

## 2020-11-05 LAB — CUP PACEART REMOTE DEVICE CHECK
Date Time Interrogation Session: 20220904015046
Implantable Pulse Generator Implant Date: 20191104

## 2020-11-11 ENCOUNTER — Telehealth: Payer: Self-pay | Admitting: Physician Assistant

## 2020-11-11 NOTE — Chronic Care Management (AMB) (Signed)
  Care Management  Note   11/11/2020 Name: [name] MRN: [id number] DOB: 08-31-1940  [name] is a 80 y.o. year old male who is a primary care patient of Allwardt, Alyssa M, PA-C. The care management team was consulted for assistance with chronic disease management and care coordination needs.   [name] was given information about Care Management services today including:  CCM service includes personalized support from designated clinical staff supervised by the physician, including individualized plan of care and coordination with other care providers 24/7 contact phone numbers for assistance for urgent and routine care needs. Service will only be billed when office clinical staff spend 20 minutes or more in a month to coordinate care. Only one practitioner may furnish and bill the service in a calendar month. The patient may stop CCM services at amy time (effective at the end of the month) by phone call to the office staff. The patient will be responsible for cost sharing (co-pay) or up to 20% of the service fee (after annual deductible is met)  Patient agreed to services and verbal consent obtained.  Follow up plan:   Face to Face appointment with care management team member scheduled for: 12/24/20 _0   Noelle Penner Upstream Scheduler

## 2020-11-13 NOTE — Progress Notes (Signed)
Carelink Summary Report / Loop Recorder 

## 2020-12-09 ENCOUNTER — Ambulatory Visit (INDEPENDENT_AMBULATORY_CARE_PROVIDER_SITE_OTHER): Payer: Medicare Other

## 2020-12-09 DIAGNOSIS — I639 Cerebral infarction, unspecified: Secondary | ICD-10-CM

## 2020-12-11 LAB — CUP PACEART REMOTE DEVICE CHECK
Date Time Interrogation Session: 20221007022739
Implantable Pulse Generator Implant Date: 20191104

## 2020-12-18 NOTE — Progress Notes (Signed)
Carelink Summary Report / Loop Recorder 

## 2020-12-20 ENCOUNTER — Telehealth: Payer: Self-pay | Admitting: Pharmacist

## 2020-12-20 NOTE — Chronic Care Management (AMB) (Signed)
Chronic Care Management Pharmacy Assistant   Name: [name]  MRN: [id number] DOB: 16-Sep-1940   Reason for Encounter: Chart Review For Initial Visit With Clinical Pharmacist   Conditions to be addressed/monitored: HTN, CAD, CVA, OSA, OA, CKD, HLD, Anemia  Primary concerns for visit include: HTN, HLD  Recent office visits:  10/18/2020 [facility] (PCP) Allwardt, Alyssa PA-C; no medication changes indicated.  Recent consult visits:  None  Hospital visits:  None in previous 6 months  Medications: Outpatient Encounter Medications as of 12/20/2020  Medication Sig   acetaminophen (TYLENOL) 325 MG tablet Take 1-2 tablets (325-650 mg total) by mouth every 4 (four) hours as needed for mild pain.   allopurinol (ZYLOPRIM) 100 MG tablet Take 200 mg by mouth daily.    amLODipine (NORVASC) 5 MG tablet Take 1 tablet by mouth once daily   apixaban (ELIQUIS) 5 MG TABS tablet Take 1 tablet (5 mg total) by mouth 2 (two) times daily.   furosemide (LASIX) 20 MG tablet Take 1/2 (one-half) tablet by mouth once daily   Menthol-Methyl Salicylate (MUSCLE RUB) 10-15 % CREA Apply 1 application topically 4 (four) times daily -  with meals and at bedtime. To neck and shoulders   metoprolol tartrate (LOPRESSOR) 25 MG tablet Take 1/2 (one-half) tablet by mouth twice daily   montelukast (SINGULAIR) 10 MG tablet Take 10 mg by mouth at bedtime.   Multiple Vitamin (MULTIVITAMIN) tablet Take 1 tablet by mouth daily. GNC multi + Omega 3   niacin (NIASPAN) 1000 MG CR tablet    niacin (NIASPAN) 500 MG CR tablet Take 500 mg by mouth at bedtime.   Olmesartan Medoxomil (BENICAR PO)    pantoprazole (PROTONIX) 40 MG tablet Take 1 tablet (40 mg total) by mouth daily.   potassium chloride (KLOR-CON) 10 MEQ tablet Take 1 tablet (10 mEq total) by mouth daily.   rosuvastatin (CRESTOR) 10 MG tablet Take 10 mg by mouth at bedtime.   tamsulosin (FLOMAX) 0.4 MG CAPS capsule Take 1 capsule by mouth at bedtime.   No  facility-administered encounter medications on file as of 12/20/2020.   Current Medications: Potassium Chloride 10 meq last filled 07/20/2020 90 DS - not sure if taking or not Eliquis 5 mg last filled 11/28/2020 30 DS Pantoprazole 40 mg last filled 10/14/2020 90 DS Furosemide 20 mg last filled 12/12/2020 60 DS Mulitiple Vitamin Olmesartan Medoxomil - not sure if taking or not Niacin 1000 mg Tamsulosin 0.4 mg last filled 08/15/2020 90 DS Metoprolol Tartrate 25 mg last filled 10/06/2020 60 DS Amlodipine 5 mg last filled 09/25/2020 90 DS Rosuvastatin 10 mg last filled 07/21/2020 90 DS Acetaminophen 325 mg - no longer taking  Menthol-Methyl Salicylate Montelukast 10 mg last filled 08/21/2020 90 DS Allopurinol 100 mg last filled 11/06/2020 90 DS Niacin 500 mg last filled 06/02/2020 30 DS one in the morning and one at night  **I attempted to review medication list with patient. He is unsure of some of his medications. He states he will bring all of his medications with him to his appointment.  Patient Questions:  Any changes in your medications or health? Patient states he has not had any changes recently in his medications or health.  Any side effects from any medications?  Patient states he does not currently having any side effects from any of his medications.  Do you have any symptoms or problems not managed by your medications? Patient states he does not currently having any symptoms or problems that are not  managed by his medications.  Any concerns about your health right now? Patient denies having any concerns of his health at this time.  Has your provider asked that you check blood pressure, blood sugar, or follow special diet at home? Patient states he does not check his blood pressure nor his blood sugar. He follows a low salt diet.  Do you get any type of exercise on a regular basis? Patient states he is very active working around his home such as mowing the yard  etc.  Can you think of a goal you would like to reach for your health? Patient states he would like to finish rehab.  Do you have any problems getting your medications? Patient states his wife takes care of his medications for him.  Is there anything that you would like to discuss during the appointment?  Patient states he would like to discuss his medications.  Please bring medications and supplements to appointment   Care Gaps: Medicare Annual Wellness: Due now Hemoglobin A1C: 5.8% on 01/01/2018 Colonoscopy: Aged out, completed 08/13/2008   Future Appointments  Date Time Provider [place]  12/24/2020 11:00 AM [facility] CCM PHARMACIST [facility]  01/13/2021  7:05 AM [facility] DEVICE REMOTES [facility]  02/17/2021  7:05 AM [facility] DEVICE REMOTES [facility]  05/02/2021  2:00 PM Hilty, Nadean Corwin, MD [facility]    Star Rating Drugs: Rosuvastatin 10 mg last filled 07/21/2020 90 DS  April D Calhoun, [place] Pharmacist Assistant [phone]

## 2020-12-21 ENCOUNTER — Other Ambulatory Visit: Payer: Self-pay | Admitting: Internal Medicine

## 2020-12-23 NOTE — Progress Notes (Signed)
Chronic Care Management Pharmacy Note  12/24/2020 Name:  [name] MRN:  [id number] DOB:  [birth year]  Summary: Initial PharmD visit.  Reports high copay for Eliquis.  BP has been controlled.  Currently uses pill box organized by wife, interested in pill packs and synchronization through [facility].  Recommendations/Changes made from today's visit: PAP for Eliquis Will follow up on packs/sync  Plan: FU 6 months   Subjective: [name] is an 80 y.o. year old male who is a primary patient of Allwardt, Alyssa M, PA-C.  The CCM team was consulted for assistance with disease management and care coordination needs.    Engaged with patient face to face for initial visit in response to provider referral for pharmacy case management and/or care coordination services.   Consent to Services:  The patient was given the following information about Chronic Care Management services today, agreed to services, and gave verbal consent: 1. CCM service includes personalized support from designated clinical staff supervised by the primary care provider, including individualized plan of care and coordination with other care providers 2. 24/7 contact phone numbers for assistance for urgent and routine care needs. 3. Service will only be billed when office clinical staff spend 20 minutes or more in a month to coordinate care. 4. Only one practitioner may furnish and bill the service in a calendar month. 5.The patient may stop CCM services at any time (effective at the end of the month) by phone call to the office staff. 6. The patient will be responsible for cost sharing (co-pay) of up to 20% of the service fee (after annual deductible is met). Patient agreed to services and consent obtained.  Patient Care Team: Allwardt, Randa Evens, PA-C as PCP - General (Physician Assistant) Edythe Clarity, [facility] as Pharmacist (Pharmacist)  Recent office visits:  10/18/2020 OV (PCP) Allwardt, Alyssa PA-C; no medication  changes indicated.   Recent consult visits:  None   Hospital visits:  None in previous 6 months    Objective:  Lab Results  Component Value Date   CREATININE 1.62 (H) 10/18/2020   BUN 17 10/18/2020   GFR 40.45 (L) 10/18/2020   GFRNONAA 42 (L) 02/10/2019   GFRAA 49 (L) 02/10/2019   NA 139 10/18/2020   K 4.4 10/18/2020   CALCIUM 9.5 10/18/2020   CO2 25 10/18/2020   GLUCOSE 97 10/18/2020    Lab Results  Component Value Date/Time   HGBA1C 5.8 (H) 01/01/2018 03:08 AM   HGBA1C 5.8 (H) 09/07/2017 04:17 PM   GFR 40.45 (L) 10/18/2020 02:44 PM    Last diabetic Eye exam: No results found for: HMDIABEYEEXA  Last diabetic Foot exam: No results found for: HMDIABFOOTEX   Lab Results  Component Value Date   CHOL 100 10/18/2020   HDL 43.50 10/18/2020   LDLCALC 44 10/18/2020   TRIG 61.0 10/18/2020   CHOLHDL 2 10/18/2020    Hepatic Function Latest Ref Rng & Units 10/18/2020 02/10/2019 01/06/2018  Total Protein 6.0 - 8.3 g/dL 7.1 7.6 7.3  Albumin 3.5 - 5.2 g/dL 3.7 3.4(L) 2.9(L)  AST 0 - 37 U/L 19 27 17   ALT 0 - 53 U/L 10 19 12   Alk Phosphatase 39 - 117 U/L 48 46 45  Total Bilirubin 0.2 - 1.2 mg/dL 0.4 0.4 0.8  Bilirubin, Direct 0.00 - 0.40 mg/dL - - -     CBC Latest Ref Rng & Units 10/18/2020 02/10/2019 01/19/2018  WBC 4.0 - 10.5 K/uL 5.6 5.4 6.3  Hemoglobin 13.0 -  17.0 g/dL 12.7(L) 13.7 11.3(L)  Hematocrit 39.0 - 52.0 % 39.3 43.1 37.0(L)  Platelets 150.0 - 400.0 K/uL 166.0 193 242    No results found for: VD25OH  Clinical ASCVD: Yes  The ASCVD Risk score (Arnett DK, et al., 2019) failed to calculate for the following reasons:   The patient has a prior MI or stroke diagnosis    Depression screen [facility] 2/9 04/16/2020 06/06/2018 05/02/2018  Decreased Interest 2 0 0  Down, Depressed, Hopeless 0 0 0  PHQ - 2 Score 2 0 0  Altered sleeping 1 - -  Tired, decreased energy 2 - -  Change in appetite 3 - -  Feeling bad or failure about yourself  0 - -  Trouble concentrating 0 - -   Moving slowly or fidgety/restless 1 - -  Suicidal thoughts 0 - -  PHQ-9 Score 9 - -  Difficult doing work/chores Somewhat difficult - -  Some recent data might be hidden      Social History   Tobacco Use  Smoking Status Former   Types: Cigars  Smokeless Tobacco Never  Tobacco Comments   used to smoke cigars, 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, none since   BP Readings from Last 3 Encounters:  10/18/20 120/77  04/23/20 136/80  04/16/20 124/80   Pulse Readings from Last 3 Encounters:  10/18/20 78  04/23/20 (!) 54  04/16/20 67   Wt Readings from Last 3 Encounters:  10/18/20 241 lb 12.8 oz (109.7 kg)  04/16/20 250 lb (113.4 kg)  11/03/19 250 lb 3.2 oz (113.5 kg)   BMI Readings from Last 3 Encounters:  10/18/20 33.72 kg/m  04/16/20 34.87 kg/m  11/03/19 32.12 kg/m    Assessment/Interventions: Review of patient past medical history, allergies, medications, health status, including review of consultants reports, laboratory and other test data, was performed as part of comprehensive evaluation and provision of chronic care management services.   SDOH:  (Social Determinants of Health) assessments and interventions performed: Yes  Financial Resource Strain: Low Risk    Difficulty of Paying Living Expenses: Not very hard    SDOH Screenings   Alcohol Screen: Not on file  Depression (PHQ2-9): Medium Risk   PHQ-2 Score: 9  Financial Resource Strain: Low Risk    Difficulty of Paying Living Expenses: Not very hard  Food Insecurity: Not on file  Housing: Not on file  Physical Activity: Not on file  Social Connections: Not on file  Stress: Not on file  Tobacco Use: Medium Risk   Smoking Tobacco Use: Former   Smokeless Tobacco Use: Never   Passive Exposure: Not on file  Transportation Needs: Not on file    [place]  Allergies  Allergen Reactions   Ramipril Cough    Medications Reviewed Today     Reviewed by Edythe Clarity, [facility] (Pharmacist) on 12/24/20 at  1155  Med List Status: <None>   Medication Order Taking? Sig Documenting Provider Last Dose Status Informant  acetaminophen (TYLENOL) 325 MG tablet [id number] Yes Take 1-2 tablets (325-650 mg total) by mouth every 4 (four) hours as needed for mild pain. Bary Leriche, PA-C Taking Active Self  allopurinol (ZYLOPRIM) 100 MG tablet [id number] Yes Take 200 mg by mouth daily.  Rogers Blocker, MD Taking Active Self  amLODipine (NORVASC) 5 MG tablet [id number] Yes Take 1 tablet by mouth once daily Hilty, Nadean Corwin, MD Taking Active   apixaban (ELIQUIS) 5 MG TABS tablet [id number] Yes Take 1 tablet (  5 mg total) by mouth 2 (two) times daily. [name], MD Taking Active   furosemide (LASIX) 20 MG tablet [id number] Yes Take 1/2 (one-half) tablet by mouth once daily Hilty, Nadean Corwin, MD Taking Active   Menthol-Methyl Salicylate (MUSCLE RUB) 10-15 % CREA [id number] Yes Apply 1 application topically 4 (four) times daily -  with meals and at bedtime. To neck and shoulders Love, Ivan Anchors, PA-C Taking Active Self  metoprolol tartrate (LOPRESSOR) 25 MG tablet [id number] Yes Take 1/2 (one-half) tablet by mouth twice daily Hilty, Nadean Corwin, MD Taking Active   montelukast (SINGULAIR) 10 MG tablet [id number] No Take 10 mg by mouth at bedtime.  Patient not taking: Reported on 12/24/2020   [provider] Not Taking Active Self  Multiple Vitamin (MULTIVITAMIN) tablet [id number] Yes Take 1 tablet by mouth daily. GNC multi + Omega 3 [provider] Taking Active Self  niacin (NIASPAN) 1000 MG CR tablet [id number] Yes  [provider] Taking Active   niacin (NIASPAN) 500 MG CR tablet [id number] Yes Take 500 mg by mouth at bedtime. [provider] Taking Active Self  Olmesartan Medoxomil (BENICAR PO) [id number] Yes  [provider] Taking Active   pantoprazole (PROTONIX) 40 MG tablet [id number] Yes Take 1 tablet (40 mg total) by mouth daily. Allwardt, Randa Evens, PA-C Taking Active    potassium chloride (KLOR-CON) 10 MEQ tablet [id number] Yes Take 1 tablet (10 mEq total) by mouth daily. Allwardt, Randa Evens, PA-C Taking Active   rosuvastatin (CRESTOR) 10 MG tablet [id number] Yes Take 10 mg by mouth at bedtime. [provider] Taking Active   tamsulosin (FLOMAX) 0.4 MG CAPS capsule [id number] Yes Take 1 capsule by mouth at bedtime. [provider] Taking Active             Patient Active Problem List   Diagnosis Date Noted   Coagulation defect ([place]) 10/12/2019   Abnormal peripheral vision, left 01/21/2018   Anemia, chronic disease    Left hemiparesis (HCC)    Acute ischemic cerebrovascular accident (CVA) involving right middle cerebral artery [facility])    CKD (chronic kidney disease), stage III ([place])    Peripheral edema    Stroke (cerebrum) ([place]) 01/05/2018   History of gout    AKI (acute kidney injury) ([place])    Stage 3 chronic kidney disease (HCC)    Anemia of chronic disease    Left-sided weakness    Osteoarthritis    OSA (obstructive sleep apnea)    Noncompliance with CPAP treatment    History of DVT (deep vein thrombosis)    History of CVA (cerebrovascular accident)    Dysphagia, post-stroke    Sinus tachycardia    Acute blood loss anemia    Acute CVA (cerebrovascular accident) ([place]) 01/01/2018   Hypertensive urgency 01/01/2018   CKD (chronic kidney disease) stage 2, GFR 60-89 ml/min 01/01/2018   Sensorineural hearing loss (SNHL), 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   Generalized weakness    Sepsis ([place]) 12/10/2016   Bacteremia due to Streptococcus 12/10/2016   Lactic acidosis 12/10/2016   Elevated troponin 12/10/2016   Renal insufficiency 12/10/2016   Hypophosphatemia 12/10/2016   Left leg swelling 12/10/2016   Febrile illness 12/09/2016   Febrile illness, acute    OBESITY, UNSPECIFIED 05/31/2009   Hyperlipidemia 05/30/2009   Obstructive sleep apnea 05/30/2009   Essential hypertension 05/30/2009   CAD (coronary artery disease)  05/30/2009    Immunization History  Administered Date(s) Administered   Influenza,inj,Quad PF,6+ Mos 01/13/2017   PFIZER(Purple Top)SARS-COV-2  Vaccination 12/20/2019    Conditions to be addressed/monitored:  HTN w/CKD, CAD, Hx of Stroke, HLD  Care Plan : General Pharmacy (Adult)  Updates made by Edythe Clarity, RPH since 12/24/2020 12:00 AM     Problem: HTN w/CKD, CAD, Hx of Stroke, HLD   Priority: High  Onset Date: 12/24/2020     Long-Range Goal: Patient-Specific Goal   Start Date: 12/24/2020  Expected End Date: 06/24/2021  This Visit's Progress: On track  Priority: High  Note:   Current Barriers:  Unable to independently afford treatment regimen  Pharmacist Clinical Goal(s):  Patient will verbalize ability to afford treatment regimen maintain control of BP as evidenced by home monitoring  through collaboration with PharmD and provider.   Interventions: 1:1 collaboration with Allwardt, Randa Evens, PA-C regarding development and update of comprehensive plan of care as evidenced by provider attestation and co-signature Inter-disciplinary care team collaboration (see longitudinal plan of care) Comprehensive medication review performed; medication list updated in electronic medical record  Hypertension w/ Stage 3 CKD (BP goal <130/80) -Controlled -Current treatment: Amlodipine 16m daily Metoprolol tartrate 217mone-half tablet po bid -Medications previously tried: olmesartan -Current home readings: "normal" no logs today, has been normal at recent OV -Current dietary habits: sticks to a low sodium diet -Current exercise habits: minimal, active around the house does do occasional walking -Denies hypotensive/hypertensive symptoms -Educated on BP goals and benefits of medications for prevention of heart attack, stroke and kidney damage; Daily salt intake goal < 2300 mg; Exercise goal of 150 minutes per week; Importance of home blood pressure monitoring; -Counseled to  monitor BP at home weekly, document, and provide log at future appointments -Denies any swelling currently from amlodipine -Recommended to continue current medication  Hyperlipidemia/CAD/Hx of Stroke: (LDL goal < 70) -Controlled -Current treatment: Rosuvastatin 1031maily Eliquis 5mg4mD -Medications previously tried: Lovastatin  -Educated on Cholesterol goals;  Benefits of statin for ASCVD risk reduction; Importance of limiting foods high in cholesterol; Exercise goal of 150 minutes per week; -most recent LDL at goal -Recommended to continue current medication -Does mention copay barrier with Eliquis, he was unsure of income in office.  Printeed Eliquis PAP application to take home and bring back if he determines he is eligible.  Patient Goals/Self-Care Activities Patient will:  - take medications as prescribed focus on medication adherence by pill box collaborate with provider on medication access solutions  Follow Up Plan: The care management team will reach out to the patient again over the next 180 days.        Medication Assistance: Application for Eliquis  medication assistance program. in process.  Anticipated assistance start date unknown.  See plan of care for additional detail.  Compliance/Adherence/Medication fill history: Care Gaps: Pneumonia vaccine  Star-Rating Drugs: Rosuvastatin 10mg61m22/22 90ds  Patient[facility]:  [facility] [place] - [id number] [id number]: [phone] pill box? Yes - wife organizes Pt endorses 100% compliance  We discussed: Benefits of medication synchronization, packaging and delivery as well as enhanced pharmacist oversight with [facility]. Patient decided to:  He will check on insurance and let me know so we can determine cost difference in [facility] vs. [facility].  He is interested if cost is equal.  Verbal consent  obtained for [facility] enhanced pharmacy services (medication synchronization, adherence packaging, delivery coordination). A medication sync plan was created to allow patient to get all medications delivered  once every 30 to 90 days per patient preference. Patient understands they have freedom to choose pharmacy and clinical pharmacist will coordinate care between all prescribers and [facility].   Care Plan and Follow Up Patient Decision:  Patient agrees to Care Plan and Follow-up.  Plan: The care management team will reach out to the patient again over the next 180 days.  Beverly Milch, PharmD Clinical Pharmacist [phone]

## 2020-12-24 ENCOUNTER — Other Ambulatory Visit: Payer: Self-pay

## 2020-12-24 ENCOUNTER — Ambulatory Visit (INDEPENDENT_AMBULATORY_CARE_PROVIDER_SITE_OTHER): Payer: Medicare Other | Admitting: Pharmacist

## 2020-12-24 DIAGNOSIS — N1831 Chronic kidney disease, stage 3a: Secondary | ICD-10-CM

## 2020-12-24 DIAGNOSIS — Z8739 Personal history of other diseases of the musculoskeletal system and connective tissue: Secondary | ICD-10-CM

## 2020-12-24 DIAGNOSIS — Z8673 Personal history of transient ischemic attack (TIA), and cerebral infarction without residual deficits: Secondary | ICD-10-CM

## 2020-12-24 NOTE — Patient Instructions (Addendum)
Visit Information   Goals Addressed             This Visit's Progress    Track and Manage My Blood Pressure-Hypertension       Timeframe:  Long-Range Goal Priority:  High Start Date:  12/24/20                           Expected End Date:  06/24/21                     Follow Up Date 03/26/21    - check blood pressure weekly - choose a place to take my blood pressure (home, clinic or office, retail store) - write blood pressure results in a log or diary    Why is this important?   You won't feel high blood pressure, but it can still hurt your blood vessels.  High blood pressure can cause heart or kidney problems. It can also cause a stroke.  Making lifestyle changes like losing a little weight or eating less salt will help.  Checking your blood pressure at home and at different times of the day can help to control blood pressure.  If the doctor prescribes medicine remember to take it the way the doctor ordered.  Call the office if you cannot afford the medicine or if there are questions about it.     Notes:        Patient Care Plan: General Pharmacy (Adult)     Problem Identified: HTN w/CKD, CAD, Hx of Stroke, HLD   Priority: High  Onset Date: 12/24/2020     Long-Range Goal: Patient-Specific Goal   Start Date: 12/24/2020  Expected End Date: 06/24/2021  This Visit's Progress: On track  Priority: High  Note:   Current Barriers:  Unable to independently afford treatment regimen  Pharmacist Clinical Goal(s):  Patient will verbalize ability to afford treatment regimen maintain control of BP as evidenced by home monitoring  through collaboration with PharmD and provider.   Interventions: 1:1 collaboration with Allwardt, Randa Evens, PA-C regarding development and update of comprehensive plan of care as evidenced by provider attestation and co-signature Inter-disciplinary care team collaboration (see longitudinal plan of care) Comprehensive medication review performed;  medication list updated in electronic medical record  Hypertension w/ Stage 3 CKD (BP goal <130/80) -Controlled -Current treatment: Amlodipine 5mg  daily Metoprolol tartrate 25mg  one-half tablet po bid -Medications previously tried: olmesartan -Current home readings: "normal" no logs today, has been normal at recent OV -Current dietary habits: sticks to a low sodium diet -Current exercise habits: minimal, active around the house does do occasional walking -Denies hypotensive/hypertensive symptoms -Educated on BP goals and benefits of medications for prevention of heart attack, stroke and kidney damage; Daily salt intake goal < 2300 mg; Exercise goal of 150 minutes per week; Importance of home blood pressure monitoring; -Counseled to monitor BP at home weekly, document, and provide log at future appointments -Denies any swelling currently from amlodipine -Recommended to continue current medication  Hyperlipidemia/CAD/Hx of Stroke: (LDL goal < 70) -Controlled -Current treatment: Rosuvastatin 10mg  daily Eliquis 5mg  BID -Medications previously tried: Lovastatin  -Educated on Cholesterol goals;  Benefits of statin for ASCVD risk reduction; Importance of limiting foods high in cholesterol; Exercise goal of 150 minutes per week; -most recent LDL at goal -Recommended to continue current medication -Does mention copay barrier with Eliquis, he was unsure of income in office.  Printeed Eliquis PAP application to take home  and bring back if he determines he is eligible.  Patient Goals/Self-Care Activities Patient will:  - take medications as prescribed focus on medication adherence by pill box collaborate with provider on medication access solutions  Follow Up Plan: The care management team will reach out to the patient again over the next 180 days.       Mr. Fleming was given information about Chronic Care Management services today including:  CCM service includes personalized support  from designated clinical staff supervised by his physician, including individualized plan of care and coordination with other care providers 24/7 contact phone numbers for assistance for urgent and routine care needs. Standard insurance, coinsurance, copays and deductibles apply for chronic care management only during months in which we provide at least 20 minutes of these services. Most insurances cover these services at 100%, however patients may be responsible for any copay, coinsurance and/or deductible if applicable. This service may help you avoid the need for more expensive face-to-face services. Only one practitioner may furnish and bill the service in a calendar month. The patient may stop CCM services at any time (effective at the end of the month) by phone call to the office staff.  Patient agreed to services and verbal consent obtained.   The patient verbalized understanding of instructions, educational materials, and care plan provided today and agreed to receive a mailed copy of patient instructions, educational materials, and care plan.  Telephone follow up appointment with pharmacy team member scheduled for: 6 months  Edythe Clarity, [place]

## 2020-12-30 DIAGNOSIS — N1831 Chronic kidney disease, stage 3a: Secondary | ICD-10-CM | POA: Diagnosis not present

## 2020-12-31 ENCOUNTER — Telehealth: Payer: Self-pay | Admitting: Pharmacist

## 2020-12-31 NOTE — Progress Notes (Signed)
    Chronic Care Management Pharmacy Assistant   Name: [name]  MRN: [id number] DOB: [birth year]  Reason for Encounter: CCM Care Plan  Medications: Outpatient Encounter Medications as of 12/31/2020  Medication Sig   acetaminophen (TYLENOL) 325 MG tablet Take 1-2 tablets (325-650 mg total) by mouth every 4 (four) hours as needed for mild pain.   allopurinol (ZYLOPRIM) 100 MG tablet Take 200 mg by mouth daily.    amLODipine (NORVASC) 5 MG tablet Take 1 tablet by mouth once daily   apixaban (ELIQUIS) 5 MG TABS tablet Take 1 tablet (5 mg total) by mouth 2 (two) times daily.   furosemide (LASIX) 20 MG tablet Take 1/2 (one-half) tablet by mouth once daily   Menthol-Methyl Salicylate (MUSCLE RUB) 10-15 % CREA Apply 1 application topically 4 (four) times daily -  with meals and at bedtime. To neck and shoulders   metoprolol tartrate (LOPRESSOR) 25 MG tablet Take 1/2 (one-half) tablet by mouth twice daily   montelukast (SINGULAIR) 10 MG tablet Take 10 mg by mouth at bedtime. (Patient not taking: Reported on 12/24/2020)   Multiple Vitamin (MULTIVITAMIN) tablet Take 1 tablet by mouth daily. [employer] multi + Omega 3   niacin (NIASPAN) 1000 MG CR tablet    niacin (NIASPAN) 500 MG CR tablet Take 500 mg by mouth at bedtime.   Olmesartan Medoxomil (BENICAR PO)    pantoprazole (PROTONIX) 40 MG tablet Take 1 tablet (40 mg total) by mouth daily.   potassium chloride (KLOR-CON) 10 MEQ tablet Take 1 tablet (10 mEq total) by mouth daily. (Patient not taking: Reported on 12/24/2020)   rosuvastatin (CRESTOR) 10 MG tablet Take 10 mg by mouth at bedtime.   tamsulosin (FLOMAX) 0.4 MG CAPS capsule Take 1 capsule by mouth at bedtime.   No facility-administered encounter medications on file as of 12/31/2020.   Reviewed the patients initial visit reinsured it was completed per the pharmacist Leata Mouse request. Printed the CCM care plan. Mailed the patient CCM care plan to their most recent address on  file.  Follow-Up:Pharmacist Review  Charlann Lange, [place] Pharmacist Assistant [phone]

## 2021-01-07 ENCOUNTER — Ambulatory Visit (INDEPENDENT_AMBULATORY_CARE_PROVIDER_SITE_OTHER): Payer: Medicare Other | Admitting: Physician Assistant

## 2021-01-07 ENCOUNTER — Other Ambulatory Visit: Payer: Self-pay

## 2021-01-07 VITALS — BP 126/81 | HR 52 | Temp 98.2°F | Ht 71.0 in | Wt 242.2 lb

## 2021-01-07 DIAGNOSIS — R7309 Other abnormal glucose: Secondary | ICD-10-CM | POA: Diagnosis not present

## 2021-01-07 DIAGNOSIS — Z23 Encounter for immunization: Secondary | ICD-10-CM

## 2021-01-07 DIAGNOSIS — N1831 Chronic kidney disease, stage 3a: Secondary | ICD-10-CM | POA: Diagnosis not present

## 2021-01-07 DIAGNOSIS — R350 Frequency of micturition: Secondary | ICD-10-CM

## 2021-01-07 DIAGNOSIS — N401 Enlarged prostate with lower urinary tract symptoms: Secondary | ICD-10-CM | POA: Diagnosis not present

## 2021-01-07 LAB — CBC WITH DIFFERENTIAL/PLATELET
Basophils Absolute: 0 10*3/uL (ref 0.0–0.1)
Basophils Relative: 0.6 % (ref 0.0–3.0)
Eosinophils Absolute: 0.2 10*3/uL (ref 0.0–0.7)
Eosinophils Relative: 4.1 % (ref 0.0–5.0)
HCT: 39.6 % (ref 39.0–52.0)
Hemoglobin: 12.6 g/dL — ABNORMAL LOW (ref 13.0–17.0)
Lymphocytes Relative: 30.5 % (ref 12.0–46.0)
Lymphs Abs: 1.7 10*3/uL (ref 0.7–4.0)
MCHC: 31.9 g/dL (ref 30.0–36.0)
MCV: 90.2 fl (ref 78.0–100.0)
Monocytes Absolute: 0.5 10*3/uL (ref 0.1–1.0)
Monocytes Relative: 9.9 % (ref 3.0–12.0)
Neutro Abs: 3 10*3/uL (ref 1.4–7.7)
Neutrophils Relative %: 54.9 % (ref 43.0–77.0)
Platelets: 191 10*3/uL (ref 150.0–400.0)
RBC: 4.39 Mil/uL (ref 4.22–5.81)
RDW: 15.1 % (ref 11.5–15.5)
WBC: 5.5 10*3/uL (ref 4.0–10.5)

## 2021-01-07 LAB — PSA: PSA: 2.7 ng/mL (ref 0.10–4.00)

## 2021-01-07 LAB — COMPREHENSIVE METABOLIC PANEL
ALT: 10 U/L (ref 0–53)
AST: 19 U/L (ref 0–37)
Albumin: 3.7 g/dL (ref 3.5–5.2)
Alkaline Phosphatase: 50 U/L (ref 39–117)
BUN: 17 mg/dL (ref 6–23)
CO2: 29 mEq/L (ref 19–32)
Calcium: 9.6 mg/dL (ref 8.4–10.5)
Chloride: 106 mEq/L (ref 96–112)
Creatinine, Ser: 1.32 mg/dL (ref 0.40–1.50)
GFR: 51.63 mL/min — ABNORMAL LOW (ref >60.00)
Glucose, Bld: 77 mg/dL (ref 70–99)
Potassium: 4.3 mEq/L (ref 3.5–5.1)
Sodium: 140 mEq/L (ref 135–145)
Total Bilirubin: 0.6 mg/dL (ref 0.2–1.2)
Total Protein: 6.7 g/dL (ref 6.0–8.3)

## 2021-01-07 LAB — POCT URINALYSIS DIPSTICK
Bilirubin, UA: NEGATIVE
Blood, UA: NEGATIVE
Glucose, UA: NEGATIVE
Ketones, UA: NEGATIVE
Leukocytes, UA: NEGATIVE
Nitrite, UA: NEGATIVE
Protein, UA: NEGATIVE
Spec Grav, UA: 1.02 (ref 1.010–1.025)
Urobilinogen, UA: 0.2 E.U./dL
pH, UA: 6 (ref 5.0–8.0)

## 2021-01-07 LAB — HEMOGLOBIN A1C: Hgb A1c MFr Bld: 5.7 % (ref 4.6–6.5)

## 2021-01-07 NOTE — Patient Instructions (Signed)
Very good to see you today. Please go to the lab and we will plan for virtual or phone visit to discuss once results are back.  Flu shot today.

## 2021-01-07 NOTE — Progress Notes (Signed)
Subjective:    Patient ID: [name], male    DOB: 1940-10-15, 80 y.o.   MRN: [id number]  Chief Complaint  Patient presents with   Urinary Frequency   Chronic Kidney Disease    HPI Patient is in today for urinary frequency and CKD concerns. He is here with his wife today and they say that they have not been told previously he has CKD, but he was provided a handout on this after his visit with clinical pharmacist. They would like to go over labs again and make sure they are taking appropriate steps for management.   He is also complaining of urinary frequency throughout the day. Also up every 1-2 hours during the evening time. No dysuria or hematuria. No fever or chills. No abdominal pain.   They would like labs rechecked to make sure he does not have diabetes or prostate elevation. They would also like his flu shot updated.   Past Medical History:  Diagnosis Date   CAD (coronary artery disease)    s/p cath in 2005 and CABG x4   Cataract    DJD (degenerative joint disease)    DJD (degenerative joint disease)    Fluid retention    mild   H/O: gout    History of blood clots    HTN (hypertension)    Hyperlipidemia    Hyperlipidemia    Myocardial infarction ([place]) 2009   Obesity    with reduction   OSA (obstructive sleep apnea)    AHI 98/hr   Renal insufficiency    Sleep apnea    Stroke [facility]) 2019    Past Surgical History:  Procedure Laterality Date   CORONARY ARTERY BYPASS GRAFT  2005   LIMA to LAD, SVG to OM1 and OM 2, RCA.    left inguinal hernia repair     LOOP RECORDER INSERTION N/A 01/03/2018   Procedure: LOOP RECORDER INSERTION;  Surgeon: Evans Lance, MD;  Location: [facility];  Service: Cardiovascular;  Laterality: N/A;   TEE WITHOUT CARDIOVERSION N/A 12/11/2016   Procedure: TRANSESOPHAGEAL ECHOCARDIOGRAM (TEE);  Surgeon: [name], MD;  Location[facility] ENDOSCOPY;  Service: Cardiovascular;  Laterality: N/A;   TEE WITHOUT CARDIOVERSION N/A  01/03/2018   Procedure: TRANSESOPHAGEAL ECHOCARDIOGRAM (TEE);  Surgeon: Dorothy Spark, MD;  Location: [facility] ENDOSCOPY;  Service: Cardiovascular;  Laterality: N/A;    Family History  Problem Relation Age of Onset   Gout Mother    Stroke Mother    Coronary artery disease Mother    Hypertension Mother    Arthritis Mother    Coronary artery disease Father    Arthritis Father    Gout Father    Stroke Father    Hypertension Father    Coronary artery disease Brother    Arthritis Brother    Gout Brother    Stroke Brother    Hypertension Brother    Coronary artery disease Brother    Arthritis Brother    Gout Brother    Stroke Brother    Hypertension Brother    Coronary artery disease Other        significant in multiple family members    Social History   Tobacco Use   Smoking status: Former    Types: Cigars   Smokeless tobacco: Never   Tobacco comments:    used to smoke cigars, 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, none since  Vaping Use   Vaping Use: Never used  Substance Use Topics  Alcohol use: No   Drug use: No     Allergies  Allergen Reactions   Ramipril Cough    Review of Systems REFER TO HPI FOR PERTINENT POSITIVES AND NEGATIVES      Objective:     BP 126/81   Pulse (!) 52   Temp 98.2 F (36.8 C)   Ht 5\' 11"  (1.803 m)   Wt 242 lb 3.2 oz (109.9 kg)   SpO2 97%   BMI 33.78 kg/m   Wt Readings from Last 3 Encounters:  01/07/21 242 lb 3.2 oz (109.9 kg)  10/18/20 241 lb 12.8 oz (109.7 kg)  04/16/20 250 lb (113.4 kg)    BP Readings from Last 3 Encounters:  01/07/21 126/81  10/18/20 120/77  04/23/20 136/80     Physical Exam Vitals and nursing note reviewed.  Constitutional:      General: He is not in acute distress.    Appearance: Normal appearance. He is not toxic-appearing.  HENT:     Head: Normocephalic and atraumatic.     Right Ear: External ear normal.     Left Ear: External ear normal.     Nose: Nose normal.     Mouth/Throat:     Mouth:  Mucous membranes are moist.     Pharynx: Oropharynx is clear.  Eyes:     Extraocular Movements: Extraocular movements intact.     Conjunctiva/sclera: Conjunctivae normal.     Pupils: Pupils are equal, round, and reactive to light.  Cardiovascular:     Rate and Rhythm: Normal rate and regular rhythm.     Pulses: Normal pulses.     Heart sounds: Normal heart sounds.  Pulmonary:     Effort: Pulmonary effort is normal.     Breath sounds: Normal breath sounds.  Abdominal:     General: Abdomen is flat. Bowel sounds are normal.     Palpations: Abdomen is soft.     Tenderness: There is no abdominal tenderness. There is no right CVA tenderness or left CVA tenderness.  Musculoskeletal:        General: Normal range of motion.     Cervical back: Normal range of motion and neck supple.  Skin:    General: Skin is warm and dry.  Neurological:     General: No focal deficit present.     Mental Status: He is alert and oriented to person, place, and time.     Cranial Nerves: No cranial nerve deficit.     Motor: No weakness.     Gait: Gait abnormal (slow, cautious).  Psychiatric:        Mood and Affect: Mood normal.        Behavior: Behavior normal.       Assessment & Plan:   Problem List Items Addressed This Visit       Genitourinary   CKD (chronic kidney disease), stage III ([place])   Relevant Orders   CBC with Differential/Platelet (Completed)   Comprehensive metabolic panel (Completed)   Other Visit Diagnoses     Urinary frequency    -  Primary   Relevant Orders   POCT urinalysis dipstick (Completed)   Benign prostatic hyperplasia with urinary frequency       Relevant Orders   CBC with Differential/Platelet (Completed)   PSA (Completed)   Elevated glucose       Relevant Orders   CBC with Differential/Platelet (Completed)   Comprehensive metabolic panel (Completed)   Hemoglobin A1c (Completed)   Need for immunization against influenza  Relevant Orders   Flu Vaccine QUAD  High Dose(Fluad) (Completed)       1. Urinary frequency  -POCT urine clear; most likely secondary to Lasix 20 mg qod & existing BPH     Component Value Date/Time   COLORURINE YELLOW 02/10/2019 [place] 02/10/2019 1835   LABSPEC 1.013 02/10/2019 1835   PHURINE 5.0 02/10/2019 [place] 02/10/2019 [place] 02/10/2019 1835   BILIRUBINUR neg 01/07/2021 [place] 02/10/2019 1835   PROTEINUR Negative 01/07/2021 1719   PROTEINUR 30 (A) 02/10/2019 1835   UROBILINOGEN 0.2 01/07/2021 1719   UROBILINOGEN 1.0 10/08/2008 2214   NITRITE neg 01/07/2021 1719   NITRITE NEGATIVE 02/10/2019 1835   LEUKOCYTESUR Negative 01/07/2021 1719   LEUKOCYTESUR NEGATIVE 02/10/2019 1835    2. Stage 3a chronic kidney disease ([place]) -Recheck CMP today -Reviewed AVS handout from clinical pharmacist with him -Reiterated to avoid NSAIDs  3. Benign prostatic hyperplasia with urinary frequency -Check PSA today -Continue Flomax 0.4 mg daily -Consider urology   4. Elevated glucose -Check Ha1c level with labs today -Encouraged lower sugar foods  5. Need for immunization against influenza -Flu vaccine updated in office    This note was prepared with assistance of Dragon voice recognition software. Occasional wrong-word or sound-a-like substitutions may have occurred due to the inherent limitations of voice recognition software.  Time Spent: 31 minutes of total time was spent on the date of the encounter performing the following actions: chart review prior to seeing the patient, obtaining history, performing a medically necessary exam, counseling on the treatment plan, placing orders, and documenting in our EHR.    Kealey Kemmer M Logun Colavito, PA-C

## 2021-01-09 ENCOUNTER — Telehealth: Payer: Self-pay

## 2021-01-09 LAB — CUP PACEART REMOTE DEVICE CHECK
Date Time Interrogation Session: 20221109012646
Implantable Pulse Generator Implant Date: 20191104

## 2021-01-09 NOTE — Telephone Encounter (Signed)
Left voice message for patient to call clinic.  

## 2021-01-09 NOTE — Telephone Encounter (Signed)
Patient calling back about lab results. Patient would like to be called back at [phone].

## 2021-01-13 ENCOUNTER — Ambulatory Visit (INDEPENDENT_AMBULATORY_CARE_PROVIDER_SITE_OTHER): Payer: Medicare Other

## 2021-01-13 DIAGNOSIS — I639 Cerebral infarction, unspecified: Secondary | ICD-10-CM

## 2021-01-14 NOTE — Telephone Encounter (Signed)
Left voice message for patient to call clinic.  

## 2021-01-15 ENCOUNTER — Telehealth: Payer: Self-pay | Admitting: Internal Medicine

## 2021-01-15 NOTE — Telephone Encounter (Signed)
Wife of the patient calling in regards to a letter the patient got dated for August.  Please leave a detailed message on their home answering machine if the wife does not pick up.

## 2021-01-15 NOTE — Telephone Encounter (Signed)
See lab results.  

## 2021-01-15 NOTE — Telephone Encounter (Signed)
Received permission from pt to speak with wife. She was calling to make sure nothing needed to be done about this letter. She was going through some paperwork and found this letter dated from August.  Wanted to make sure we are still getting what we need from pt's device. Made aware that our office would let them know if we don't get a transmission for his ILR. Aware we have received monthly checks as expected. Wife appreciates the return call.

## 2021-01-20 NOTE — Progress Notes (Signed)
Carelink Summary Report / Loop Recorder 

## 2021-01-28 ENCOUNTER — Other Ambulatory Visit: Payer: Self-pay | Admitting: Internal Medicine

## 2021-01-28 ENCOUNTER — Other Ambulatory Visit: Payer: Self-pay | Admitting: Physician Assistant

## 2021-02-07 ENCOUNTER — Other Ambulatory Visit: Payer: Self-pay

## 2021-02-07 ENCOUNTER — Telehealth: Payer: Self-pay

## 2021-02-07 MED ORDER — ROSUVASTATIN CALCIUM 10 MG PO TABS: 10.0000 mg | ORAL_TABLET | Freq: Every day | ORAL | 2 refills | Status: DC

## 2021-02-07 NOTE — Telephone Encounter (Signed)
  Encourage patient to contact the pharmacy for refills or they can request refills through [facility]:  01/07/21  NEXT APPOINTMENT DATE:  MEDICATION: rosuvastatin (CRESTOR) 10 MG tablet  Is the patient out of medication? no  PHARMACY: [facility] ([facility]), [place] - [address]  COMMENTS: Patient will be out on Sunday.   Let patient know to contact pharmacy at the end of the day to make sure medication is ready.  Please notify patient to allow 48-72 hours to process

## 2021-02-07 NOTE — Telephone Encounter (Signed)
Rx sent in

## 2021-02-17 ENCOUNTER — Ambulatory Visit (INDEPENDENT_AMBULATORY_CARE_PROVIDER_SITE_OTHER): Payer: Medicare Other

## 2021-02-17 DIAGNOSIS — I639 Cerebral infarction, unspecified: Secondary | ICD-10-CM

## 2021-02-18 LAB — CUP PACEART REMOTE DEVICE CHECK
Date Time Interrogation Session: 20221212013245
Implantable Pulse Generator Implant Date: 20191104

## 2021-02-26 NOTE — Progress Notes (Signed)
Carelink Summary Report / Loop Recorder 

## 2021-03-05 ENCOUNTER — Other Ambulatory Visit: Payer: Self-pay | Admitting: Internal Medicine

## 2021-03-06 ENCOUNTER — Other Ambulatory Visit: Payer: Self-pay

## 2021-03-06 ENCOUNTER — Telehealth: Payer: Self-pay | Admitting: Physician Assistant

## 2021-03-06 MED ORDER — AMLODIPINE BESYLATE 5 MG PO TABS: 5.0000 mg | ORAL_TABLET | Freq: Every day | ORAL | 1 refills | Status: DC

## 2021-03-06 MED ORDER — ALLOPURINOL 100 MG PO TABS: 200.0000 mg | ORAL_TABLET | Freq: Every day | ORAL | 1 refills | Status: DC

## 2021-03-06 NOTE — Telephone Encounter (Signed)
Medication:  allopurinol (ZYLOPRIM) 100 MG tablet  Has the patient contacted their pharmacy? Yes.   (If no, request that the patient contact the pharmacy for the refill.) (If yes, when and what did the pharmacy advise?) Stated they submitted a rx request for med but hasn't heard anything back  Preferred Pharmacy (with phone number or street name): [facility] ([address] [place] [facility] [place]  [address]) [place] [address]  Phone:  [phone]  Fax:  [phone]   Agent: Please be advised that RX refills may take up to 3 business days. We ask that you follow-up with your pharmacy.

## 2021-03-06 NOTE — Telephone Encounter (Signed)
Rx sent in

## 2021-03-10 ENCOUNTER — Telehealth: Payer: Self-pay | Admitting: Physician Assistant

## 2021-03-10 NOTE — Telephone Encounter (Signed)
Patient's spouse came in to ask [place] to change the wording on his Allopurinol prescription. His old one stated to take at bedtime but the new one does not. She is afraid his caregiver will give it to him in the morning when she is not present. If you have any questions, she stated you could call her.

## 2021-03-11 NOTE — Telephone Encounter (Signed)
Left voice message to call clinic 

## 2021-03-12 ENCOUNTER — Other Ambulatory Visit: Payer: Self-pay

## 2021-03-12 MED ORDER — ALLOPURINOL 100 MG PO TABS: 200.0000 mg | ORAL_TABLET | Freq: Every day | ORAL | 1 refills | Status: DC

## 2021-03-12 NOTE — Telephone Encounter (Signed)
[facility]  Advanced Heart Failure and Transplant  Heart Transplant Clinic  Follow-up Visit    Primary Care Physician: Brodsky, Mark E  Referring Provider: Brett Justin Berman  Date of Transplant: 04/21/2019  Organ(s) Transplanted: heart  Indication for transplant: Dilated Myopathy: Idiopathic  PHS increased risk donor: Yes    ID. 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    Interval History:    The patient was last seen on 06/16/21. At that time issues with pain after urologic procedure.    He continues to deal with pain issue largely from prostate surgery. He still has some bleeding and some tissue come out. He tried different strategies and nothing helped. This is really impacting quality of life. He gets tired and frustrated and does not want to take it out on his family.    ROS:  A complete ROS was performed and is negative except as documented in the HPI.      Allergies:  Patient is allergic to cats [other] and dogs [other].    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    Pericardial effusion    Hypertension    Chronic back pain    At risk for infection transmitted from donor    Acute hepatitis C virus infection    Heart transplanted (CMS-HCC)    Acute UTI    Umbilical hernia without obstruction and without gangrene    COVID-19 virus detected    Acute medial meniscus tear of left knee, sequela    Localized osteoarthritis of left knee     Past Surgical History:   Procedure Laterality Date    CARDIAC DEFIBRILLATOR PLACEMENT       PB ANESTH,SHOULDER JOINT,NOS Right      Family History   Problem Relation Name Age of Onset    Hypertension Other      Other Maternal Grandmother          kidney disease needing HD     Social History     Socioeconomic History    Marital status: Single     Spouse name: Not on file    Number of children: Not on file    Years of education: Not on file    Highest education level: Not on file   Occupational History    Not on file   Tobacco Use    Smoking status: Never    Smokeless tobacco: Never    Tobacco comments:     from friends and relatives    Substance and Sexual Activity    Alcohol use: Not Currently     Comment: Prior heavier use, but completely quit in 2016    Drug use: Yes     Comment: eats edible marijuana for pain and insomnia     Sexual activity: Not on file   Other Topics Concern    Not on file   Social History Narrative      Born in [place], also lived in [place], [place], [place], no travel, worked as a carpenter, occasional cedar, no birds, no hot tubs, worked in construction + possible asbestos exposure      Social Determinants of Health     Financial Resource Strain: Not on file   Food Insecurity: Not on file   Transportation Needs: Not on file   Physical Activity: Not on file   Stress: Not on file   Social Connections: Not on file   Intimate Partner Violence: Not on file   Housing Stability: Not on file     Current Outpatient Medications   Medication Sig    albuterol 108 (90 Base) MCG/ACT inhaler Inhale 2 puffs by mouth every 4 hours as needed for Wheezing or Shortness of Breath.    aspirin 81 MG EC tablet Take 1 tablet (81 mg) by mouth daily.    baclofen (LIORESAL) 10 MG tablet Take 2 tablets (20 mg) by mouth nightly.    Blood Glucose Monitoring Suppl (TRUE METRIX METER) w/Device KIT Use as directed    budesonide-formoterol (SYMBICORT) 160-4.5 MCG/ACT inhaler Inhale 2 puffs by mouth every 12 hours.    bumetanide (BUMEX) 1 MG tablet Take 1 tablet (1 mg) by mouth daily as needed (fluid/weight  gain). Do not take unless instructed by Transplant team.    Calcium Carb-Cholecalciferol 600-10 MG-MCG TABS Take 1 tablet by mouth 2 times daily.    Cetirizine HCl (ZERVIATE) 0.24 % SOLN Place 1 drop into both eyes 2 times daily.    clindamycin (CLEOCIN T) 1 % solution Apply 1 Application. topically 2 times daily. Apply to the red bumps on your face up to two times a day.    controlled substance agreement controlled substance agreement    diclofenac (VOLTAREN) 1 % gel Apply 2 g topically 4 times daily.    docusate sodium (COLACE) 100 MG capsule Take 1 capsule (100 mg) by mouth 2 times daily.    DULoxetine (CYMBALTA) 30 MG CR capsule Take 1 capsule (30 mg) by mouth daily.    famotidine (PEPCID) 20 MG tablet Take 1 tablet (20 mg) by mouth 2 times daily.    fluticasone propionate (FLONASE) 50 MCG/ACT nasal spray Spray 1 spray into each nostril 2 times daily.    gabapentin (NEURONTIN) 300 MG capsule Take 1 capsule (300 mg) by mouth every morning AND 1 capsule (300 mg) daily AND 2 capsules (600 mg) every evening.    hydroCHLOROthiazide (HYDRODIURIL) 25 MG tablet Take 1 tablet (25 mg) by mouth daily.    ketoconazole (NIZORAL) 2 % shampoo Use shampoo daily for dandruff    lidocaine (LIDOCAINE PAIN RELIEF) 4 % patch Apply 1 patch topically every 24 hours. Leave patch on for 12 hours, then remove for 12 hours.    lisinopril (PRINIVIL, ZESTRIL) 10 MG tablet Take 2 tablets (20 mg) by mouth daily.    magnesium oxide (MAG-OX) 400 MG tablet Take 1 tablet by mouth daily    melatonin (GNP MELATONIN MAXIMUM STRENGTH) 5 MG tablet Take 2 tablets (10 mg) by mouth at bedtime.    Multiple Vitamin (MULTIVITAMIN) TABS tablet Take 1 tablet by mouth daily.    naloxone (KLOXXADO) 8 mg/0.1 mL nasal spray Call 911! Tilt head and spray intranasally into one nostril as needed for respiratory depression. If patient does not respond or responds and then relapses, repeat using a new nasal spray every 3 minutes until emergency medical assistance  arrives.    NEEDLE, DISP, 25 G 25G X   1" MISC Use to inject testosterone    NIFEdipine (ADALAT CC) 30 MG Controlled-Release tablet Take 1 tablet (30 mg) by mouth nightly.    ondansetron (ZOFRAN) 8 MG tablet Take 1 tablet (8 mg) by mouth every 8 hours as needed for Nausea/Vomiting.    oxyCODONE (ROXICODONE) 10 MG tablet Take 1 tab every 4 hours as needed for moderate pain and 2 tabs every 4 hours as needed for severe pain. Max 10 tabs per day, 28 day supply    phenazopyridine (PYRIDIUM) 100 MG tablet Take 1 tablet (100 mg) by mouth 3 times daily.    polyethylene glycol (GLYCOLAX) 17 GM/SCOOP powder Mix 17 grams in 4-8 oz of liquide and drink by mouth daily as needed (Constipation).    pravastatin (PRAVACHOL) 40 MG tablet Take 1 tablet (40 mg) by mouth every evening.    senna (SENOKOT) 8.6 MG tablet Take 1 tablet (8.6 mg) by mouth daily.    sirolimus (RAPAMUNE) 1 MG tablet Take 2 tablets (2 mg) by mouth every morning.    SYRINGE-NEEDLE, DISP, 3 ML (B-D 3CC LUER-LOK SYR 25GX1") 25G X 1" 3 ML MISC Use as directed to inject testosterone    SYRINGE-NEEDLE, DISP, 3 ML 18G X 1-1/2" 3 ML MISC Use to draw up testosterone    tacrolimus (ENVARSUS XR) 1 MG tablet STOP TAKING since 11/12/21 - remaining on chart for dose adjustments, titratable med.    tacrolimus (ENVARSUS XR) 4 MG tablet Take 1 tablet (4 mg) by mouth every morning.    tamsulosin (FLOMAX) 0.4 MG capsule Take 1 capsule (0.4 mg) by mouth daily.    tamsulosin (FLOMAX) 0.4 MG capsule Take 1 capsule (0.4 mg) by mouth daily.    testosterone cypionate (DEPO-TESTOSTERONE) 200 MG/ML SOLN Inject 1 ml into the muscle every 14 days    traZODone (DESYREL) 50 MG tablet Take 1 tablet (50 mg) by mouth nightly.     Current Facility-Administered Medications   Medication    diphenhydrAMINE (BENADRYL) injection 50 mg    diphenhydrAMINE (BENADRYL) tablet 50 mg     Immunization History   Administered Date(s) Administered    COVID-19 (Moderna) Low Dose Red Cap >= 18 Years 04/12/2020     COVID-19 (Moderna) Red Cap >= 12 Years 05/20/2019, 06/19/2019, 10/18/2019    Hep-A/Hep-B; Twinrix, Adult 05/13/2020    Influenza Vaccine (High Dose) Quadrivalent >=65 Years 01/03/2020    Influenza Vaccine (Unspecified) 10/31/2016    Influenza Vaccine >=6 Months 01/13/2010, 01/13/2011, 03/10/2012, 12/06/2013, 11/19/2017, 12/05/2018    Pneumococcal 13 Vaccine (PREVNAR-13) 01/03/2020    Pneumococcal 23 Vaccine (PNEUMOVAX-23) 01/13/2013, 05/13/2020    Tdap 03/03/2011   Deferred Date(s) Deferred    Pneumococcal 23 Vaccine (PNEUMOVAX-23) 05/04/2019     Physical Exam:  BP 102/69 (BP Location: Right arm, BP Patient Position: Sitting, BP cuff size: Large)   Pulse 98   Temp 98.5 F (36.9 C) (Temporal)   Resp 16   Ht 5' 10" (1.778 m)   Wt 96.2 kg (212 lb)   SpO2 97%   BMI 30.42 kg/m      General Appearance: ***alert, no distress, pleasant affect, cooperative.  Heart:  JVD ***, PMI ***, normal rate and regular rhythm, no murmurs, clicks, or gallops. ***  Lungs: ***clear to auscultation and percussion. No rales, rhonchi, or wheezes noted. No chest deformities noted.  Abdomen: ***BS normal.  Abdomen soft, non-tender.  No masses or organomegaly.  Extremities:  ***no cyanosis, clubbing, or edema. Has 2+ peripheral pulses.        Lab Data:  Lab Results   Component Value Date    BUN 26 (H) 11/12/2021    CREAT 1.98 (H) 11/12/2021    CL 99 11/12/2021    NA 140 11/12/2021    K 4.4 11/12/2021    CA 9.2 11/12/2021    TBILI 0.47 11/12/2021    ALB 4.1 11/12/2021    TP 7.1 11/12/2021    AST 22 11/12/2021    ALK 76 11/12/2021    BICARB 29 11/12/2021    ALT 25 11/12/2021    GLU 126 (H) 11/12/2021     Lab Results   Component Value Date    WBC 7.9 11/12/2021    RBC 5.50 11/12/2021    HGB 15.2 11/12/2021    HCT 46.5 11/12/2021    MCV 84.5 11/12/2021    MCHC 32.7 11/12/2021    RDW 12.3 11/12/2021    PLT 162 11/12/2021    MPV 11.6 11/12/2021     Lab Results   Component Value Date    A1C 5.7 04/11/2021     Lab Results   Component Value Date     TSH 1.63 04/11/2021     Lab Results   Component Value Date    CHOL 105 04/11/2021    HDL 38 04/11/2021    LDLCALC 43 04/11/2021    TRIG 121 04/11/2021     Lab Results   Component Value Date    SIROT 11.5 11/12/2021     Lab Results   Component Value Date    FKTR 6.5 11/12/2021     No results found for: CSATR  Lab Results   Component Value Date    CMVPL Not Detected 01/31/2021     Lab Results   Component Value Date    DSA ABSENT 11/12/2021       Prior Cardiovascular Studies:   Lab Results   Component Value Date    LV Ejection Fraction 59 05/08/2021          Echo 05/08/21  Summary:   1. The left ventricular size is normal. The left ventricular systolic function is normal.   2. No left ventricular hypertrophy.   3. Normal pattern of left ventricular diastolic filling.   4. EF=59%.   5. Compared to prior study EF now 59%, was 69% 05/31/20.     LHC/IVUS 05/06/21  CONCLUSION:                                                                   1. Myocardial bridging with mild systolic compression of the mid segment    of the left anterior descending coronary artery.                              2. No angiographic evidence of coronary artery disease.                      3. Intimal thickness noted in LAD/LM up to 0.5 mm (Stable to slightly       worse compare to 2022).                                                         4. Non significant FFR at apical LAD.                                        5. Left ventricular end diastolic pressure appears normal.        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    Assessment/Plan:  # Hematuria  # Dysuria  # Chronic pain  Assessment: We had a long frank discussion about patient's chronic pain issues and the heart transplant team's role in this. I discussed with him that when I initially agreed to cover his chronic opiate prescription, this was the assumption that he would  have a provider versed in chronic pain after 3-4 months, but we are at 6 months and has unable to find one. Additionally, I had not put him on a pain contract at that time, but he recently used more opiates without asking and I informed him this was not appropriate, but because he had not established guidelines I was not going to stop at this time. However, going forward until he can establish with a pain physician, we will set up a pain contract and he will need to follow through like a usual pain clinic with us with goal of provider in 3-4 months or I may start tapering. I will augment adjuvant agents additionally for now and we can continue to work on this.  Plan:  -pain contract signed  -urine tox monthly  -clinic follow up month  -oxycodone 10 mg tablets PO, 1 tab every 4 hours moderate pain, 2 tabs every 4 hours for severe pain, no more than 10 tablets a day, total 280 per 28 days.   -diclofenac cream for joint pain  -lidocaine patch for back pain  -trial of pyridium  -increase gaba at night  -siro change as below  -cymbalta as below    # End-stage heart failure s/p orthotopic heart transplant  # Chronic Immunosuppression/Immunomodulation  Assessment: While we thought continuing sirolimus would help prevent recurrent scar tissue from prostate procedure, it may be exacerbating factors now with delayed wound healing. Will try mmf for 1 month.  Plan:   - continue envarsus 6 mg daily, goal trough 4-8  - HOLD sirolimus 3 mg daily, goal trough 4-8 for at least 1 month  - start mmf 1000 mg bid for one month to allow healing  - Continue to monitor for renal toxicities, infection risk and malignancy risk  - continue pravastatin 40 mg daily  - continue aspirin 81 mg daily    # Hypertension  Assessment: controlled  Plan:  -continue lisinopril 20 mg daily  -resume hctz  -nifedipine 30 mg daily    # Dyslipidemia  -continue pravastatin 40 mg daily    # Depression  Assessment: improved mood  Plan:  -increase cymbalta to 120  mg daily     RTC in 1 month       Nicholas W Wettersten, MD  Advanced Heart Failure, Mechanical Circulatory Support, Transplant  Pgr: [phone]

## 2021-03-24 ENCOUNTER — Ambulatory Visit (INDEPENDENT_AMBULATORY_CARE_PROVIDER_SITE_OTHER): Payer: Medicare Other

## 2021-03-24 DIAGNOSIS — I639 Cerebral infarction, unspecified: Secondary | ICD-10-CM

## 2021-03-24 LAB — CUP PACEART REMOTE DEVICE CHECK
Date Time Interrogation Session: 20230122231658
Implantable Pulse Generator Implant Date: 20191104

## 2021-04-03 NOTE — Progress Notes (Signed)
Carelink Summary Report / Loop Recorder 

## 2021-04-12 ENCOUNTER — Other Ambulatory Visit: Payer: Self-pay | Admitting: Internal Medicine

## 2021-04-25 LAB — CUP PACEART REMOTE DEVICE CHECK
Date Time Interrogation Session: 20230224232122
Implantable Pulse Generator Implant Date: 20191104

## 2021-04-28 ENCOUNTER — Other Ambulatory Visit: Payer: Self-pay | Admitting: Internal Medicine

## 2021-04-28 ENCOUNTER — Ambulatory Visit (INDEPENDENT_AMBULATORY_CARE_PROVIDER_SITE_OTHER): Payer: Medicare Other

## 2021-04-28 DIAGNOSIS — I639 Cerebral infarction, unspecified: Secondary | ICD-10-CM | POA: Diagnosis not present

## 2021-05-01 NOTE — Progress Notes (Signed)
Carelink Summary Report / Loop Recorder 

## 2021-05-02 ENCOUNTER — Encounter: Payer: Self-pay | Admitting: Internal Medicine

## 2021-05-02 ENCOUNTER — Ambulatory Visit: Payer: Medicare Other | Admitting: Internal Medicine

## 2021-05-02 ENCOUNTER — Other Ambulatory Visit: Payer: Self-pay

## 2021-05-02 VITALS — BP 130/70 | HR 55 | Ht 74.0 in | Wt 238.6 lb

## 2021-05-02 DIAGNOSIS — I10 Essential (primary) hypertension: Secondary | ICD-10-CM | POA: Diagnosis not present

## 2021-05-02 DIAGNOSIS — Z951 Presence of aortocoronary bypass graft: Secondary | ICD-10-CM

## 2021-05-02 DIAGNOSIS — Z8673 Personal history of transient ischemic attack (TIA), and cerebral infarction without residual deficits: Secondary | ICD-10-CM

## 2021-05-02 DIAGNOSIS — E7800 Pure hypercholesterolemia, unspecified: Secondary | ICD-10-CM

## 2021-05-02 NOTE — Progress Notes (Signed)
OFFICE NOTE  Chief Complaint:  Follow-up  Primary Care Physician: Allwardt, Randa Evens, PA-C  HPI:  [name] is a 81 y.o. male with a past medial history significant for coronary artery disease status post CABG in 2005 (LIMA to LAD, SVG to first OM and SVG to second OM and SVG to RCA (, hypertension, dyslipidemia and gout.  He was last seen in July by Leonia Reader, DNP, which time he had a stress test which was negative for ischemia.  I had met him in October 2018 at the time when he was hospitalized for bacteremia and had a TEE then which was negative for vegetation.  Recently he unfortunately had a stroke in November and had a repeat TEE which was unremarkable.  Subsequently he had an implanted loop recorder placed by Dr. Lovena Le.  This did demonstrate 2-1 atrial flutter with 10 episodes in January and he ultimately has been placed on Eliquis.  He seems to be tolerating this.  He was unaware of any A. fib.  He continues in neuro rehab.  11/01/2018  [name] is seen today in follow-up.  Overall he continues to do well.  He denies any chest pain or worsening shortness of breath.  His blood pressure is well controlled today.  Heart rate is low normal.  His only complaint really is some itchiness of his sternal scars which is likely from keloids.  EKG showed sinus bradycardia with some PVCs.  He is asymptomatic denying any palpitations or awareness of his heart rate.  He denies any recurrent atrial fibrillation.  He remains on Eliquis for anticoagulation.  No bleeding issues.  Cholesterol is followed by his primary care provider Dr. Marlou Sa.  11/03/2018  [name] returns today for annual follow-up.  Overall he is feeling well.  He denies any recurrent stroke or TIA.  He continues to get checks of his loop recorder which still has battery life.  Blood pressure is excellent today 122/74.  He is due for repeat lipids.  He has felt somewhat fatigued and notably his heart rate was in the 40s today  with a sinus bradycardia and first-degree AV block.  05/02/2021  [name] is seen today in follow-up.  He is accompanied by his wife.  He reports some fatigue but overall no complaints.  She notes that he tends to sit around a lot and often times when he is contemplating what to make for dinner she says he would pull a chair up to the refrigerator and sit there to look in there.  He does not think this is an issue.  He denies any chest pain or shortness of breath with exertion.  Blood pressure is well controlled today.  Heart rate remains in the mid 50s.  He continues to have remote checks of his implanted loop recorder which was placed in 2019.  That does not demonstrate any recurrent atrial flutter.  He is anticoagulated for this.  PMHx:  Past Medical History:  Diagnosis Date   CAD (coronary artery disease)    s/p cath in 2005 and CABG x4   Cataract    DJD (degenerative joint disease)    DJD (degenerative joint disease)    Fluid retention    mild   H/O: gout    History of blood clots    HTN (hypertension)    Hyperlipidemia    Hyperlipidemia    Myocardial infarction ([place]) 2009   Obesity    with reduction   OSA (obstructive sleep  apnea)    AHI 98/hr   Renal insufficiency    Sleep apnea    Stroke [facility]) 2019    Past Surgical History:  Procedure Laterality Date   CORONARY ARTERY BYPASS GRAFT  2005   LIMA to LAD, SVG to OM1 and OM 2, RCA.    left inguinal hernia repair     LOOP RECORDER INSERTION N/A 01/03/2018   Procedure: LOOP RECORDER INSERTION;  Surgeon: Evans Lance, MD;  Location: [place] [facility];  Service: Cardiovascular;  Laterality: N/A;   TEE WITHOUT CARDIOVERSION N/A 12/11/2016   Procedure: TRANSESOPHAGEAL ECHOCARDIOGRAM (TEE);  Surgeon: [name], MD;  Location: [facility];  Service: Cardiovascular;  Laterality: N/A;   TEE WITHOUT CARDIOVERSION N/A 01/03/2018   Procedure: TRANSESOPHAGEAL ECHOCARDIOGRAM (TEE);  Surgeon: Dorothy Spark, MD;  Location: [place];  Service: Cardiovascular;  Laterality: N/A;    FAMHx:  Family History  Problem Relation Age of Onset   Gout Mother    Stroke Mother    Coronary artery disease Mother    Hypertension Mother    Arthritis Mother    Coronary artery disease Father    Arthritis Father    Gout Father    Stroke Father    Hypertension Father    Coronary artery disease Brother    Arthritis Brother    Gout Brother    Stroke Brother    Hypertension Brother    Coronary artery disease Brother    Arthritis Brother    Gout Brother    Stroke Brother    Hypertension Brother    Coronary artery disease Other        significant in multiple family members    SOCHx:   reports that he has quit smoking. His smoking use included cigars. He has never used smokeless tobacco. He reports that he does not drink alcohol and does not use drugs.  ALLERGIES:  Allergies  Allergen Reactions   Ramipril Cough    ROS: Pertinent items noted in HPI and remainder of comprehensive ROS otherwise negative.  HOME MEDS: Current Outpatient Medications on File Prior to Visit  Medication Sig Dispense Refill   acetaminophen (TYLENOL) 325 MG tablet Take 1-2 tablets (325-650 mg total) by mouth every 4 (four) hours as needed for mild pain.     allopurinol (ZYLOPRIM) 100 MG tablet Take 2 tablets (200 mg total) by mouth at bedtime. 180 tablet 1   amLODipine (NORVASC) 5 MG tablet Take 1 tablet (5 mg total) by mouth daily. 90 tablet 1   apixaban (ELIQUIS) 5 MG TABS tablet Take 1 tablet (5 mg total) by mouth 2 (two) times daily. 180 tablet 1   furosemide (LASIX) 20 MG tablet Take 1/2 (one-half) tablet by mouth once daily 30 tablet 0   Menthol-Methyl Salicylate (MUSCLE RUB) 10-15 % CREA Apply 1 application topically 4 (four) times daily -  with meals and at bedtime. To neck and shoulders  0   metoprolol tartrate (LOPRESSOR) 25 MG tablet Take 0.5 tablets (12.5 mg total) by mouth 2 (two) times daily. Keep upcoming appointment 30  tablet 1   Multiple Vitamin (MULTIVITAMIN) tablet Take 1 tablet by mouth daily. [employer] multi + Omega 3     niacin (NIASPAN) 1000 MG CR tablet      niacin (NIASPAN) 500 MG CR tablet Take 500 mg by mouth at bedtime.     Olmesartan Medoxomil (BENICAR PO)      pantoprazole (PROTONIX) 40 MG tablet Take 1 tablet by mouth once  daily 90 tablet 0   rosuvastatin (CRESTOR) 10 MG tablet Take 1 tablet (10 mg total) by mouth at bedtime. 90 tablet 2   tamsulosin (FLOMAX) 0.4 MG CAPS capsule Take 1 capsule by mouth at bedtime.     potassium chloride (KLOR-CON) 10 MEQ tablet Take 1 tablet (10 mEq total) by mouth daily. 90 tablet 1   No current facility-administered medications on file prior to visit.    LABS/IMAGING: No results found for this or any previous visit (from the past 48 hour(s)). No results found.  LIPID PANEL:    Component Value Date/Time   CHOL 100 10/18/2020 1444   CHOL 186 09/07/2017 1617   TRIG 61.0 10/18/2020 1444   HDL 43.50 10/18/2020 1444   HDL 39 (L) 09/07/2017 1617   CHOLHDL 2 10/18/2020 1444   VLDL 12.2 10/18/2020 1444   LDLCALC 44 10/18/2020 1444   LDLCALC 126 (H) 09/07/2017 1617     WEIGHTS: Wt Readings from Last 3 Encounters:  05/02/21 238 lb 9.6 oz (108.2 kg)  01/07/21 242 lb 3.2 oz (109.9 kg)  10/18/20 241 lb 12.8 oz (109.7 kg)    VITALS: BP 130/70    Pulse (!) 55    Ht _0  (1.88 m)    Wt 238 lb 9.6 oz (108.2 kg)    SpO2 97%    BMI 30.63 kg/m   EXAM: General appearance: alert and no distress Neck: no carotid bruit, no JVD and thyroid not enlarged, symmetric, no tenderness/mass/nodules Lungs: clear to auscultation bilaterally Heart: regular rate and rhythm Abdomen: soft, non-tender; bowel sounds normal; no masses,  no organomegaly Extremities: extremities normal, atraumatic, no cyanosis or edema Pulses: 2+ and symmetric Skin: Skin color, texture, turgor normal. No rashes or lesions Neurologic: Mental status: Alert, oriented, thought content appropriate, 4  out of 5 left arm strength Psych: Pleasant  EKG: Sinus bradycardia first-degree AV block at 55--personally reviewed  ASSESSMENT: CAD status post CABG x4 in 2005 (LIMA to LAD, SVG to OM1 and OM 2, SVG to RCA), low risk Myoview stress test with normal LV function (08/2017) - LVEF 52% Hypertension Dyslipidemia Stroke (12/2017) - s/p ILR Paroxysmal atrial flutter-anticoagulated on Eliquis - CHADVASC score of 5  PLAN: 1.   [name] has no real complaints.  His wife noted some fatigue however he just appears to be not that physically active since his retirement.  He tends to sleep later in does not exercise regularly.  He says he does not like the cold weather.  Hopefully he will be more active as the weather improves.  He is compliant with the Eliquis and denies any bleeding issues.  Blood pressure and cholesterol well controlled.  He has had no new events on his loop recorder.  He denies any chest pain.  Plan follow-up with me annually or sooner as necessary.  [name], MD, [facility], [place] Director of the Advanced Lipid Disorders &  Cardiovascular Risk Reduction Clinic Diplomate of the American Board of Clinical Lipidology Attending Cardiologist  Direct Dial: [phone]   Fax: [phone]  Website:  [email or web address] 05/02/2021, 1:38 PM

## 2021-05-02 NOTE — Patient Instructions (Signed)
Medication Instructions:  ?Your physician recommends that you continue on your current medications as directed. Please refer to the Current Medication list given to you today. ? ?*If you need a refill on your cardiac medications before your next appointment, please call your pharmacy* ? ?Follow-Up: ?At [facility], you and your health needs are our priority.  As part of our continuing mission to provide you with exceptional heart care, we have created designated Provider Care Teams.  These Care Teams include your primary Cardiologist (physician) and Advanced Practice Providers (APPs -  Physician Assistants and Nurse Practitioners) who all work together to provide you with the care you need, when you need it. ? ?We recommend signing up for the patient portal called "MyChart".  Sign up information is provided on this After Visit Summary.  MyChart is used to connect with patients for Virtual Visits (Telemedicine).  Patients are able to view lab/test results, encounter notes, upcoming appointments, etc.  Non-urgent messages can be sent to your provider as well.   ?To learn more about what you can do with MyChart, go to [email or web address].   ? ?Your next appointment:   ? ?1 year visit ? ?

## 2021-05-16 ENCOUNTER — Other Ambulatory Visit: Payer: Self-pay | Admitting: Physician Assistant

## 2021-05-21 ENCOUNTER — Other Ambulatory Visit: Payer: Self-pay | Admitting: Internal Medicine

## 2021-05-28 ENCOUNTER — Telehealth: Payer: Self-pay | Admitting: Physician Assistant

## 2021-05-28 NOTE — Telephone Encounter (Signed)
.. ?  Encourage patient to contact the pharmacy for refills or they can request refills through [facility] ? ?LAST APPOINTMENT DATE:  01/07/21 ? ?NEXT APPOINTMENT DATE: N/A ? ?MEDICATION:tamsulosin (FLOMAX) 0.4 MG CAPS capsule ? ?Is the patient out of medication? yes ? ?PHARMACY: ?[facility] ([address]), [place] - [address] Phone:  [phone]  ?Fax:  [phone]  ?  ? ? ?Let patient know to contact pharmacy at the end of the day to make sure medication is ready. ? ?Please notify patient to allow 48-72 hours to process  ?

## 2021-05-29 ENCOUNTER — Other Ambulatory Visit: Payer: Self-pay

## 2021-05-29 MED ORDER — TAMSULOSIN HCL 0.4 MG PO CAPS
0.4000 mg | ORAL_CAPSULE | Freq: Every day | ORAL | 0 refills | Status: DC
Start: 2021-05-29 — End: 2021-11-21

## 2021-05-29 NOTE — Telephone Encounter (Signed)
Rx sent in . Please call patient to schedule Follow Up ?

## 2021-06-02 ENCOUNTER — Ambulatory Visit (INDEPENDENT_AMBULATORY_CARE_PROVIDER_SITE_OTHER): Payer: Medicare Other

## 2021-06-02 DIAGNOSIS — Z8673 Personal history of transient ischemic attack (TIA), and cerebral infarction without residual deficits: Secondary | ICD-10-CM

## 2021-06-02 LAB — CUP PACEART REMOTE DEVICE CHECK
Date Time Interrogation Session: 20230401001711
Implantable Pulse Generator Implant Date: 20191104

## 2021-06-11 ENCOUNTER — Other Ambulatory Visit: Payer: Self-pay | Admitting: Internal Medicine

## 2021-06-12 NOTE — Telephone Encounter (Signed)
Prescription refill request for Eliquis received. ?Indication:Aflutter ?Last office visit:3/23 ?Scr:1.3 ?Age: 81 ?Weight:108.2 kg ? ?Prescription refilled ? ?

## 2021-06-13 NOTE — Progress Notes (Signed)
Carelink Summary Report / Loop Recorder 

## 2021-06-19 ENCOUNTER — Ambulatory Visit (INDEPENDENT_AMBULATORY_CARE_PROVIDER_SITE_OTHER): Payer: Medicare Other | Admitting: Physician Assistant

## 2021-06-19 ENCOUNTER — Encounter: Payer: Self-pay | Admitting: Physician Assistant

## 2021-06-19 VITALS — BP 135/74 | HR 57 | Temp 97.9°F | Ht 74.0 in | Wt 235.0 lb

## 2021-06-19 DIAGNOSIS — R350 Frequency of micturition: Secondary | ICD-10-CM

## 2021-06-19 DIAGNOSIS — N401 Enlarged prostate with lower urinary tract symptoms: Secondary | ICD-10-CM | POA: Diagnosis not present

## 2021-06-19 DIAGNOSIS — R5383 Other fatigue: Secondary | ICD-10-CM

## 2021-06-19 DIAGNOSIS — Z8673 Personal history of transient ischemic attack (TIA), and cerebral infarction without residual deficits: Secondary | ICD-10-CM | POA: Diagnosis not present

## 2021-06-19 DIAGNOSIS — N1831 Chronic kidney disease, stage 3a: Secondary | ICD-10-CM

## 2021-06-19 DIAGNOSIS — D638 Anemia in other chronic diseases classified elsewhere: Secondary | ICD-10-CM | POA: Diagnosis not present

## 2021-06-19 DIAGNOSIS — G479 Sleep disorder, unspecified: Secondary | ICD-10-CM

## 2021-06-19 DIAGNOSIS — R7309 Other abnormal glucose: Secondary | ICD-10-CM | POA: Diagnosis not present

## 2021-06-19 DIAGNOSIS — Z8739 Personal history of other diseases of the musculoskeletal system and connective tissue: Secondary | ICD-10-CM

## 2021-06-19 DIAGNOSIS — H6121 Impacted cerumen, right ear: Secondary | ICD-10-CM

## 2021-06-19 LAB — VITAMIN D 25 HYDROXY (VIT D DEFICIENCY, FRACTURES): VITD: 15.2 ng/mL — ABNORMAL LOW (ref 30.00–100.00)

## 2021-06-19 LAB — CBC WITH DIFFERENTIAL/PLATELET
Basophils Absolute: 0 10*3/uL (ref 0.0–0.1)
Basophils Relative: 0.6 % (ref 0.0–3.0)
Eosinophils Absolute: 0.3 10*3/uL (ref 0.0–0.7)
Eosinophils Relative: 4.5 % (ref 0.0–5.0)
HCT: 38.9 % — ABNORMAL LOW (ref 39.0–52.0)
Hemoglobin: 12.7 g/dL — ABNORMAL LOW (ref 13.0–17.0)
Lymphocytes Relative: 27.7 % (ref 12.0–46.0)
Lymphs Abs: 1.9 10*3/uL (ref 0.7–4.0)
MCHC: 32.6 g/dL (ref 30.0–36.0)
MCV: 89.2 fl (ref 78.0–100.0)
Monocytes Absolute: 0.7 10*3/uL (ref 0.1–1.0)
Monocytes Relative: 10 % (ref 3.0–12.0)
Neutro Abs: 3.9 10*3/uL (ref 1.4–7.7)
Neutrophils Relative %: 57.2 % (ref 43.0–77.0)
Platelets: 219 10*3/uL (ref 150.0–400.0)
RBC: 4.37 Mil/uL (ref 4.22–5.81)
RDW: 15.2 % (ref 11.5–15.5)
WBC: 6.8 10*3/uL (ref 4.0–10.5)

## 2021-06-19 LAB — COMPREHENSIVE METABOLIC PANEL
ALT: 9 U/L (ref 0–53)
AST: 19 U/L (ref 0–37)
Albumin: 3.8 g/dL (ref 3.5–5.2)
Alkaline Phosphatase: 54 U/L (ref 39–117)
BUN: 19 mg/dL (ref 6–23)
CO2: 29 mEq/L (ref 19–32)
Calcium: 9.6 mg/dL (ref 8.4–10.5)
Chloride: 107 mEq/L (ref 96–112)
Creatinine, Ser: 1.37 mg/dL (ref 0.40–1.50)
GFR: 49.23 mL/min — ABNORMAL LOW (ref >60.00)
Glucose, Bld: 85 mg/dL (ref 70–99)
Potassium: 4.4 mEq/L (ref 3.5–5.1)
Sodium: 142 mEq/L (ref 135–145)
Total Bilirubin: 0.5 mg/dL (ref 0.2–1.2)
Total Protein: 6.9 g/dL (ref 6.0–8.3)

## 2021-06-19 LAB — VITAMIN B12: Vitamin B-12: 262 pg/mL (ref 211–911)

## 2021-06-19 LAB — PSA: PSA: 3.95 ng/mL (ref 0.10–4.00)

## 2021-06-19 LAB — TSH: TSH: 1.89 u[IU]/mL (ref 0.35–5.50)

## 2021-06-19 NOTE — Progress Notes (Signed)
? ?Subjective:  ? ? Patient ID: [name], male    DOB: 09/12/1940, 81 y.o.   MRN: [id number] ? ?Chief Complaint  ?Patient presents with  ? Medication Refill  ?  Pt is not aware of which medication needs to be discussed.   ? ? ?HPI ?Patient is in today for medication recheck. Here with his wife.  ? ?Health has been very good. Grandchildren keeping him busy currently. Granddaughter going off to college at [employer].  ? ?Dr. Debara Pickett - 05/02/21 - annual f/ups  ? ?Still complains of same amount of urinary frequency. Every two hours, sometimes even more frequent per wife. Up at least 5-6 times per night, small amounts at a time. Taking Flomax 0.4 mg at bedtime. Takes Lasix 20 mg daily for lower extremity edema. ? ?Occ weak feeling in left leg, 2/2 CVA 2019, but he says this doesn't bother him much. ?Per wife, she reports he seems to sit too much, even to look in the fridge. Seems like all of his energy is just gone. Previously took OTC vitamins and says he didn't notice any difference. Napping during the day. Appetite has changed. 15 lb weight loss noted in the last year. ?Says he's trying to eat light and stay away from fried foods. ?Asked about depression, patient says no, and wife nods her head yes.  ?Easily agitated per wife.  ? ?Denies any constipation. No abdominal pain. ?No pain.  ?No headaches. Sometimes dizzy when he bends over too quickly.  ?No CP or palpitations or SOB. ? ?Says he recently went to eye doctor and was given reading glasses, but otherwise no concerns.  Wife notes some hearing changes on R side. Reports no falls.  ? ?Past Medical History:  ?Diagnosis Date  ? CAD (coronary artery disease)   ? s/p cath in 2005 and CABG x4  ? Cataract   ? DJD (degenerative joint disease)   ? DJD (degenerative joint disease)   ? Fluid retention   ? mild  ? H/O: gout   ? History of blood clots   ? HTN (hypertension)   ? Hyperlipidemia   ? Hyperlipidemia   ? Myocardial infarction [facility]) 2009  ? Obesity   ? with reduction  ?  OSA (obstructive sleep apnea)   ? AHI 98/hr  ? Renal insufficiency   ? Sleep apnea   ? Stroke [facility]) 2019  ? ? ?Past Surgical History:  ?Procedure Laterality Date  ? CORONARY ARTERY BYPASS GRAFT  2005  ? LIMA to LAD, SVG to OM1 and OM 2, RCA.   ? left inguinal hernia repair    ? LOOP RECORDER INSERTION N/A 01/03/2018  ? Procedure: LOOP RECORDER INSERTION;  Surgeon: Evans Lance, MD;  Location: [place] CV LAB;  Service: Cardiovascular;  Laterality: N/A;  ? TEE WITHOUT CARDIOVERSION N/A 12/11/2016  ? Procedure: TRANSESOPHAGEAL ECHOCARDIOGRAM (TEE);  Surgeon: [name], MD;  Location: [facility] ENDOSCOPY;  Service: Cardiovascular;  Laterality: N/A;  ? TEE WITHOUT CARDIOVERSION N/A 01/03/2018  ? Procedure: TRANSESOPHAGEAL ECHOCARDIOGRAM (TEE);  Surgeon: Dorothy Spark, MD;  Location: [place];  Service: Cardiovascular;  Laterality: N/A;  ? ? ?Family History  ?Problem Relation Age of Onset  ? Gout Mother   ? Stroke Mother   ? Coronary artery disease Mother   ? Hypertension Mother   ? Arthritis Mother   ? Coronary artery disease Father   ? Arthritis Father   ? Gout Father   ? Stroke Father   ? Hypertension  Father   ? Coronary artery disease Brother   ? Arthritis Brother   ? Gout Brother   ? Stroke Brother   ? Hypertension Brother   ? Coronary artery disease Brother   ? Arthritis Brother   ? Gout Brother   ? Stroke Brother   ? Hypertension Brother   ? Coronary artery disease Other   ?     significant in multiple family members  ? ? ?Social History  ? ?Tobacco Use  ? Smoking status: Former  ?  Types: Cigars  ? Smokeless tobacco: Never  ? Tobacco comments:  ?  used to smoke cigars, 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, none since  ?Vaping Use  ? Vaping Use: Never used  ?Substance Use Topics  ? Alcohol use: No  ? Drug use: No  ?  ? ?Allergies  ?Allergen Reactions  ? Ramipril Cough  ? ? ?Review of Systems ?NEGATIVE UNLESS OTHERWISE INDICATED IN HPI ? ? ?   ?Objective:  ?  ? ?BP 135/74   Pulse (!) 57   Temp 97.9 ?F (36.6  ?C) (Temporal)   Ht 6\' 2"  (1.88 m)   Wt 235 lb (106.6 kg)   SpO2 100%   BMI 30.17 kg/m?  ? ?Wt Readings from Last 3 Encounters:  ?06/19/21 235 lb (106.6 kg)  ?05/02/21 238 lb 9.6 oz (108.2 kg)  ?01/07/21 242 lb 3.2 oz (109.9 kg)  ? ? ?BP Readings from Last 3 Encounters:  ?06/19/21 135/74  ?05/02/21 130/70  ?01/07/21 126/81  ?  ? ?Physical Exam ?Vitals and nursing note reviewed.  ?Constitutional:   ?   General: He is not in acute distress. ?   Appearance: Normal appearance. He is not toxic-appearing.  ?HENT:  ?   Head: Normocephalic and atraumatic.  ?   Right Ear: External ear normal. There is impacted cerumen.  ?   Left Ear: Tympanic membrane, ear canal and external ear normal.  ?   Nose: Nose normal.  ?   Mouth/Throat:  ?   Mouth: Mucous membranes are moist.  ?   Pharynx: Oropharynx is clear.  ?Eyes:  ?   Extraocular Movements: Extraocular movements intact.  ?   Conjunctiva/sclera: Conjunctivae normal.  ?   Pupils: Pupils are equal, round, and reactive to light.  ?Cardiovascular:  ?   Rate and Rhythm: Normal rate and regular rhythm.  ?   Pulses: Normal pulses.  ?   Heart sounds: Normal heart sounds.  ?Pulmonary:  ?   Effort: Pulmonary effort is normal.  ?   Breath sounds: Normal breath sounds.  ?Abdominal:  ?   General: Abdomen is flat. Bowel sounds are normal.  ?   Palpations: Abdomen is soft.  ?   Tenderness: There is no abdominal tenderness. There is no right CVA tenderness or left CVA tenderness.  ?Musculoskeletal:     ?   General: Normal range of motion.  ?   Cervical back: Normal range of motion and neck supple.  ?   Right lower leg: 2+ Edema present.  ?   Left lower leg: 2+ Edema present.  ?   Comments: Good strength in extremities  ?Skin: ?   General: Skin is warm and dry.  ?Neurological:  ?   General: No focal deficit present.  ?   Mental Status: He is alert and oriented to person, place, and time.  ?   Cranial Nerves: No cranial nerve deficit.  ?   Motor: No weakness.  ?  Gait: Gait abnormal (slow,  cautious).  ?Psychiatric:     ?   Mood and Affect: Mood normal.     ?   Behavior: Behavior normal.  ? ? ?   ?Assessment & Plan:  ? ?Problem List Items Addressed This Visit   ? ?  ? Genitourinary  ? CKD (chronic kidney disease), stage III ([place])  ? Relevant Orders  ? Comprehensive metabolic panel (Completed)  ?  ? Other  ? History of CVA (cerebrovascular accident) - Primary  ? History of gout  ? Anemia, chronic disease  ? Relevant Orders  ? Iron, TIBC and Ferritin Panel  ? CBC with Differential/Platelet (Completed)  ? ?Other Visit Diagnoses   ? ? Urinary frequency      ? Relevant Orders  ? Ambulatory referral to Urology  ? Comprehensive metabolic panel (Completed)  ? PSA (Completed)  ? Benign prostatic hyperplasia with urinary frequency      ? Relevant Orders  ? Ambulatory referral to Urology  ? PSA (Completed)  ? Other fatigue      ? Relevant Orders  ? VITAMIN D 25 Hydroxy (Vit-D Deficiency, Fractures) (Completed)  ? Vitamin B12 (Completed)  ? TSH (Completed)  ? Difficulty sleeping      ? Elevated glucose      ? Relevant Orders  ? Comprehensive metabolic panel (Completed)  ? Right ear impacted cerumen      ? ?  ? ?1. History of CVA (cerebrovascular accident) ?12/2017 ?Follows with cardiology for CAD, HTN, Dyslipidemia, PAF. ?I personally reviewed his office visit with Dr. Debara Pickett on 05/02/2021.  He will follow-up with him annually. ? ?2. Urinary frequency ?3. Benign prostatic hyperplasia with urinary frequency ?Concerning to me that patient has been taking Flomax and both him and his wife note little to no improvement, in fact maybe worsening symptoms.  I plan to recheck his PSA level today.  He also is being referred to [facility] urology today. ? ?4. Other fatigue ?5. Difficulty sleeping ?I think his fatigue overall is multifactorial given his age, his medical history, and also the fact that he is not getting any quality night sleep because of his urinary frequency.  I also think being more active during the day will  help some of the symptoms.  He needs to be mindful of diet as well.  I will check a few additional labs today including CBC, TSH, B12, vitamin D, iron panel to rule out any underlying issues.  I do wonder abou

## 2021-06-19 NOTE — Patient Instructions (Addendum)
Good to see you today! ?Please go to the lab for blood work and I will send results through [place]. ? ? ?Look into [employer] - would love to see you more active.  ? ?Stay on current medication regimen ? ?Referral to [facility] - want to look at options to get you more sleep at night  ?

## 2021-06-20 LAB — IRON,TIBC AND FERRITIN PANEL
%SAT: 20 % (calc) (ref 20–48)
Ferritin: 203 ng/mL (ref 24–380)
Iron: 43 ug/dL — ABNORMAL LOW (ref 50–180)
TIBC: 217 mcg/dL (calc) — ABNORMAL LOW (ref 250–425)

## 2021-06-23 ENCOUNTER — Other Ambulatory Visit: Payer: Self-pay

## 2021-06-23 DIAGNOSIS — E559 Vitamin D deficiency, unspecified: Secondary | ICD-10-CM

## 2021-06-23 MED ORDER — VITAMIN D (ERGOCALCIFEROL) 1.25 MG (50000 UNIT) PO CAPS: 50000.0000 [IU] | ORAL_CAPSULE | ORAL | 0 refills | Status: AC

## 2021-06-25 ENCOUNTER — Telehealth: Payer: Self-pay | Admitting: Physician Assistant

## 2021-06-25 NOTE — Telephone Encounter (Signed)
Pt's spouse stated they wanted a recommendation as to what pt needs to do for his ear issue.  ?

## 2021-06-25 NOTE — Telephone Encounter (Signed)
Patient scheduled.

## 2021-06-25 NOTE — Telephone Encounter (Signed)
Please schedule appointment for ear wash with pt. Not Nurse visit.  ? ?Thanks!  ?

## 2021-06-27 ENCOUNTER — Ambulatory Visit (INDEPENDENT_AMBULATORY_CARE_PROVIDER_SITE_OTHER): Payer: Medicare Other | Admitting: Physician Assistant

## 2021-06-27 ENCOUNTER — Encounter: Payer: Self-pay | Admitting: Physician Assistant

## 2021-06-27 VITALS — BP 144/81 | HR 54 | Temp 97.8°F | Ht 74.0 in | Wt 232.8 lb

## 2021-06-27 DIAGNOSIS — H6121 Impacted cerumen, right ear: Secondary | ICD-10-CM | POA: Diagnosis not present

## 2021-06-27 NOTE — Progress Notes (Signed)
? ?Subjective:  ? ? Patient ID: [name], male    DOB: May 20, 1940, 81 y.o.   MRN: [id number] ? ?Chief Complaint  ?Patient presents with  ? Ear Fullness  ? ? ?Ear Fullness  ? ?Patient is in today for ear wax removal R ear canal. No pain or other symptoms.  ? ?Past Medical History:  ?Diagnosis Date  ? CAD (coronary artery disease)   ? s/p cath in 2005 and CABG x4  ? Cataract   ? DJD (degenerative joint disease)   ? DJD (degenerative joint disease)   ? Fluid retention   ? mild  ? H/O: gout   ? History of blood clots   ? HTN (hypertension)   ? Hyperlipidemia   ? Hyperlipidemia   ? Myocardial infarction [facility]) 2009  ? Obesity   ? with reduction  ? OSA (obstructive sleep apnea)   ? AHI 98/hr  ? Renal insufficiency   ? Sleep apnea   ? Stroke [facility]) 2019  ? ? ?Past Surgical History:  ?Procedure Laterality Date  ? CORONARY ARTERY BYPASS GRAFT  2005  ? LIMA to LAD, SVG to OM1 and OM 2, RCA.   ? left inguinal hernia repair    ? LOOP RECORDER INSERTION N/A 01/03/2018  ? Procedure: LOOP RECORDER INSERTION;  Surgeon: Evans Lance, MD;  Location: [place] CV LAB;  Service: Cardiovascular;  Laterality: N/A;  ? TEE WITHOUT CARDIOVERSION N/A 12/11/2016  ? Procedure: TRANSESOPHAGEAL ECHOCARDIOGRAM (TEE);  Surgeon: [name], MD;  Location: [facility] ENDOSCOPY;  Service: Cardiovascular;  Laterality: N/A;  ? TEE WITHOUT CARDIOVERSION N/A 01/03/2018  ? Procedure: TRANSESOPHAGEAL ECHOCARDIOGRAM (TEE);  Surgeon: Dorothy Spark, MD;  Location: [place];  Service: Cardiovascular;  Laterality: N/A;  ? ? ?Family History  ?Problem Relation Age of Onset  ? Gout Mother   ? Stroke Mother   ? Coronary artery disease Mother   ? Hypertension Mother   ? Arthritis Mother   ? Coronary artery disease Father   ? Arthritis Father   ? Gout Father   ? Stroke Father   ? Hypertension Father   ? Coronary artery disease Brother   ? Arthritis Brother   ? Gout Brother   ? Stroke Brother   ? Hypertension Brother   ? Coronary artery disease Brother   ?  Arthritis Brother   ? Gout Brother   ? Stroke Brother   ? Hypertension Brother   ? Coronary artery disease Other   ?     significant in multiple family members  ? ? ?Social History  ? ?Tobacco Use  ? Smoking status: Former  ?  Types: Cigars  ? Smokeless tobacco: Never  ? Tobacco comments:  ?  used to smoke cigars, 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, none since  ?Vaping Use  ? Vaping Use: Never used  ?Substance Use Topics  ? Alcohol use: No  ? Drug use: No  ?  ? ?Allergies  ?Allergen Reactions  ? Ramipril Cough  ? ? ?Review of Systems ?NEGATIVE UNLESS OTHERWISE INDICATED IN HPI ? ? ?   ?Objective:  ?  ? ?BP (!) 144/81   Pulse (!) 54   Temp 97.8 ?F (36.6 ?C) (Temporal)   Ht 6\' 2"  (1.88 m)   Wt 232 lb 12.8 oz (105.6 kg)   SpO2 99%   BMI 29.89 kg/m?  ? ?Wt Readings from Last 3 Encounters:  ?06/27/21 232 lb 12.8 oz (105.6 kg)  ?06/19/21 235 lb (106.6  kg)  ?05/02/21 238 lb 9.6 oz (108.2 kg)  ? ? ?BP Readings from Last 3 Encounters:  ?06/27/21 (!) 144/81  ?06/19/21 135/74  ?05/02/21 130/70  ?  ? ?Physical Exam ?HENT:  ?   Right Ear: There is impacted cerumen.  ?   Left Ear: Tympanic membrane normal. There is no impacted cerumen.  ? ? ?   ?Assessment & Plan:  ? ?Problem List Items Addressed This Visit   ?None ?Visit Diagnoses   ? ? Right ear impacted cerumen    -  Primary  ? ?  ? ? ?1. Right ear impacted cerumen ?Ceruminosis is noted. Removal procedure explained and verbal consent obtained from patient. Wax is removed by syringing from R ear canal. TM visualized and normal / pearly. Pt tolerated procedure well. Instructions for home care to prevent wax buildup are given. ? ? ? ?Rebecca Motta M Ebba Goll, PA-C ?

## 2021-07-03 ENCOUNTER — Telehealth: Payer: Self-pay

## 2021-07-03 LAB — CUP PACEART REMOTE DEVICE CHECK
Date Time Interrogation Session: 20230504002000
Implantable Pulse Generator Implant Date: 20191104

## 2021-07-03 NOTE — Telephone Encounter (Signed)
ILR summary report received. Battery status OK. Normal device function. No new symptom, brady, or pause episodes. No new AF episodes.  AF burden is 0% of the time.   8 tachycardia episodes detected, able to view one episode that was tachy at 158 bpm for 26 seconds.  Sent to triage.  Monthly summary reports and ROV/PRN ? ?Will contact Pt to submit remote report to be able to review additional 7 tachycardia episodes. ? ?Left detailed message for Pt/wife. ? ?Advised to send remote transmission.  Requested call back to device clinic to alert when sent/or if assistance needed. ? ? ?

## 2021-07-04 ENCOUNTER — Other Ambulatory Visit: Payer: Self-pay | Admitting: Internal Medicine

## 2021-07-04 NOTE — Telephone Encounter (Signed)
I spoke with the patient and they sent the transmission. Transmission received 07-04-2021. I told them the nurse will review it and give them a call back. The patient states they will not be home but the nurse can leave a message. ?

## 2021-07-04 NOTE — Telephone Encounter (Signed)
Devices has reached RRT today.  ? ?Unsuccessful telephone encounter to patient to follow up on receipt of transmission and review of tachy episodes which patient has a known history of per episode list. Transmission also resulted battery at RRT as of today, 07/04/21. [employer] compliant VM message left requesting call back to [phone]. ? ? ? ? ? ? ? ? ? ? ? ? ? ? ?

## 2021-07-07 NOTE — Progress Notes (Deleted)
Chronic Care Management Pharmacy Note  07/07/2021 Name:  [name] MRN:  [id number] DOB:  1940/06/03  Summary: Initial PharmD visit.  Reports high copay for Eliquis.  BP has been controlled.  Currently uses pill box organized by wife, interested in pill packs and synchronization through [facility].  Recommendations/Changes made from today's visit: PAP for Eliquis Will follow up on packs/sync  Plan: FU 6 months   Subjective: [name] is an 81 y.o. year old male who is a primary patient of Allwardt, Alyssa M, PA-C.  The CCM team was consulted for assistance with disease management and care coordination needs.    Engaged with patient face to face for initial visit in response to provider referral for pharmacy case management and/or care coordination services.   Consent to Services:  The patient was given the following information about Chronic Care Management services today, agreed to services, and gave verbal consent: 1. CCM service includes personalized support from designated clinical staff supervised by the primary care provider, including individualized plan of care and coordination with other care providers 2. 24/7 contact phone numbers for assistance for urgent and routine care needs. 3. Service will only be billed when office clinical staff spend 20 minutes or [name] in a month to coordinate care. 4. Only one practitioner may furnish and bill the service in a calendar month. 5.The patient may stop CCM services at any time (effective at the end of the month) by phone call to the office staff. 6. The patient will be responsible for cost sharing (co-pay) of up to 20% of the service fee (after annual deductible is met). Patient agreed to services and consent obtained.  Patient Care Team: Allwardt, Randa Evens, PA-C as PCP - General (Physician Assistant) Edythe Clarity, [facility] as Pharmacist (Pharmacist)  Recent office visits:  10/18/2020 OV (PCP) Allwardt, Alyssa PA-C; no medication  changes indicated.   Recent consult visits:  None   Hospital visits:  None in previous 6 months    Objective:  Lab Results  Component Value Date   CREATININE 1.37 06/19/2021   BUN 19 06/19/2021   GFR 49.23 (L) 06/19/2021   GFRNONAA 42 (L) 02/10/2019   GFRAA 49 (L) 02/10/2019   NA 142 06/19/2021   K 4.4 06/19/2021   CALCIUM 9.6 06/19/2021   CO2 29 06/19/2021   GLUCOSE 85 06/19/2021    Lab Results  Component Value Date/Time   HGBA1C 5.7 01/07/2021 12:37 PM   HGBA1C 5.8 (H) 01/01/2018 03:08 AM   GFR 49.23 (L) 06/19/2021 01:53 PM   GFR 51.63 (L) 01/07/2021 12:37 PM    Last diabetic Eye exam: No results found for: HMDIABEYEEXA  Last diabetic Foot exam: No results found for: HMDIABFOOTEX   Lab Results  Component Value Date   CHOL 100 10/18/2020   HDL 43.50 10/18/2020   LDLCALC 44 10/18/2020   TRIG 61.0 10/18/2020   CHOLHDL 2 10/18/2020       Latest Ref Rng & Units 06/19/2021    1:53 PM 01/07/2021   12:37 PM 10/18/2020    2:44 PM  Hepatic Function  Total Protein 6.0 - 8.3 g/dL 6.9   6.7   7.1    Albumin 3.5 - 5.2 g/dL 3.8   3.7   3.7    AST 0 - 37 U/L _0 ALT 0 - 53 U/L _1 Alk Phosphatase 39 - 117 U/L 54  50   48    Total Bilirubin 0.2 - 1.2 mg/dL 0.5   0.6   0.4          Latest Ref Rng & Units 06/19/2021    1:53 PM 01/07/2021   12:37 PM 10/18/2020    2:44 PM  CBC  WBC 4.0 - 10.5 K/uL 6.8   5.5   5.6    Hemoglobin 13.0 - 17.0 g/dL 12.7   12.6   12.7    Hematocrit 39.0 - 52.0 % 38.9   39.6   39.3    Platelets 150.0 - 400.0 K/uL 219.0   191.0   166.0      Lab Results  Component Value Date/Time   VD25OH 15.20 (L) 06/19/2021 01:53 PM    Clinical ASCVD: Yes  The ASCVD Risk score (Arnett DK, et al., 2019) failed to calculate for the following reasons:   The patient has a prior MI or stroke diagnosis       06/19/2021    1:17 PM 04/16/2020   10:05 AM 06/06/2018    2:12 PM  Depression screen PHQ 2/9  Decreased Interest 0 2 0  Down,  Depressed, Hopeless 0 0 0  PHQ - 2 Score 0 2 0  Altered sleeping  1   Tired, decreased energy  2   Change in appetite  3   Feeling bad or failure about yourself   0   Trouble concentrating  0   Moving slowly or fidgety/restless  1   Suicidal thoughts  0   PHQ-9 Score  9   Difficult doing work/chores  Somewhat difficult       Social History   Tobacco Use  Smoking Status Former   Types: Cigars  Smokeless Tobacco Never  Tobacco Comments   used to smoke cigars, 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, none since   BP Readings from Last 3 Encounters:  06/27/21 (!) 144/81  06/19/21 135/74  05/02/21 130/70   Pulse Readings from Last 3 Encounters:  06/27/21 (!) 54  06/19/21 (!) 57  05/02/21 (!) 55   Wt Readings from Last 3 Encounters:  06/27/21 232 lb 12.8 oz (105.6 kg)  06/19/21 235 lb (106.6 kg)  05/02/21 238 lb 9.6 oz (108.2 kg)   BMI Readings from Last 3 Encounters:  06/27/21 29.89 kg/m  06/19/21 30.17 kg/m  05/02/21 30.63 kg/m    Assessment/Interventions: Review of patient past medical history, allergies, medications, health status, including review of consultants reports, laboratory and other test data, was performed as part of comprehensive evaluation and provision of chronic care management services.   SDOH:  (Social Determinants of Health) assessments and interventions performed: Yes  Financial Resource Strain: Low Risk    Difficulty of Paying Living Expenses: Not very hard    SDOH Screenings   Alcohol Screen: Not on file  Depression (PHQ2-9): Low Risk    PHQ-2 Score: 0  Financial Resource Strain: Low Risk    Difficulty of Paying Living Expenses: Not very hard  Food Insecurity: Not on file  Housing: Not on file  Physical Activity: Not on file  Social Connections: Not on file  Stress: Not on file  Tobacco Use: Medium Risk   Smoking Tobacco Use: Former   Smokeless Tobacco Use: Never   Passive Exposure: Not on file  Transportation Needs: Not on file     [place]  Allergies  Allergen Reactions   Ramipril Cough    Medications Reviewed Today  Reviewed by Tobe Sos, CMA (Certified Medical Assistant) on 06/27/21 at 66  Med List Status: <None>   Medication Order Taking? Sig Documenting Provider Last Dose Status Informant  acetaminophen (TYLENOL) 325 MG tablet [id number] No Take 1-2 tablets (325-650 mg total) by mouth every 4 (four) hours as needed for mild pain.  Patient not taking: Reported on 06/19/2021   Bary Leriche, [place] Not Taking Active Self  allopurinol (ZYLOPRIM) 100 MG tablet [id number]  Take 2 tablets (200 mg total) by mouth at bedtime. Allwardt, Alyssa M, PA-C  Expired 06/19/21 2359   amLODipine (NORVASC) 5 MG tablet [id number]  Take 1 tablet (5 mg total) by mouth daily. Allwardt, Randa Evens, PA-C  Active   apixaban (ELIQUIS) 5 MG TABS tablet [id number]  Take 1 tablet by mouth twice daily Hilty, Nadean Corwin, MD  Active   furosemide (LASIX) 20 MG tablet [id number]  Take 1/2 (one-half) tablet by mouth once daily Hilty, Nadean Corwin, MD  Active   Menthol-Methyl Salicylate (MUSCLE RUB) 10-15 % CREA [id number]  Apply 1 application topically 4 (four) times daily -  with meals and at bedtime. To neck and shoulders Love, Ivan Anchors, PA-C  Active Self  metoprolol tartrate (LOPRESSOR) 25 MG tablet [id number]  TAKE 1/2 (ONE-HALF) TABLET BY MOUTH TWICE DAILY. KEEP UPCOMING APPOINTMENT [name], MD  Active   Multiple Vitamin (MULTIVITAMIN) tablet [id number]  Take 1 tablet by mouth daily. [place] multi + Omega 3 [provider]  Active Self  niacin (NIASPAN) 1000 MG CR tablet [id number]   [provider]  Active   niacin (NIASPAN) 500 MG CR tablet [id number]  Take 500 mg by mouth at bedtime. [provider]  Active Self  Olmesartan Medoxomil (BENICAR PO) [id number]   [provider]  Active   pantoprazole (PROTONIX) 40 MG tablet [id number]  Take 1 tablet by mouth once daily Allwardt, Alyssa M, PA-C   Active   potassium chloride (KLOR-CON) 10 MEQ tablet [id number]  Take 1 tablet (10 mEq total) by mouth daily. Allwardt, Alyssa M, PA-C  Expired 01/29/21 2359   rosuvastatin (CRESTOR) 10 MG tablet [id number]  Take 1 tablet (10 mg total) by mouth at bedtime. Allwardt, Alyssa M, PA-C  Expired 05/08/21 2359   tamsulosin (FLOMAX) 0.4 MG CAPS capsule [id number]  Take 1 capsule (0.4 mg total) by mouth at bedtime. Allwardt, Randa Evens, PA-C  Active   Vitamin D, Ergocalciferol, (DRISDOL) 1.25 MG (50000 UNIT) CAPS capsule [id number]  Take 1 capsule (50,000 Units total) by mouth every 7 (seven) days. Allwardt, Randa Evens, PA-C  Active             Patient Active Problem List   Diagnosis Date Noted   Coagulation defect ([place]) 10/12/2019   Abnormal peripheral vision, left 01/21/2018   Anemia, chronic disease    Left hemiparesis (HCC)    Acute ischemic cerebrovascular accident (CVA) involving right middle cerebral artery [facility])    CKD (chronic kidney disease), stage III ([place])    Peripheral edema    Stroke (cerebrum) ([place]) 01/05/2018   History of gout    AKI (acute kidney injury) ([place])    Stage 3 chronic kidney disease (HCC)    Anemia of chronic disease    Left-sided weakness    Osteoarthritis    OSA (obstructive sleep apnea)    Noncompliance with CPAP treatment    History of DVT (deep vein thrombosis)    History of CVA (cerebrovascular accident)    Dysphagia, post-stroke  Sinus tachycardia    Acute blood loss anemia    Acute CVA (cerebrovascular accident) ([place]) 01/01/2018   Hypertensive urgency 01/01/2018   CKD (chronic kidney disease) stage 2, GFR 60-89 ml/min 01/01/2018   Sensorineural hearing loss (SNHL), 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   Generalized weakness    Sepsis ([place]) 12/10/2016   Bacteremia due to Streptococcus 12/10/2016   Lactic acidosis 12/10/2016   Elevated troponin 12/10/2016   Renal insufficiency 12/10/2016   Hypophosphatemia 12/10/2016   Left leg swelling 12/10/2016   Febrile  illness 12/09/2016   Febrile illness, acute    OBESITY, UNSPECIFIED 05/31/2009   Hyperlipidemia 05/30/2009   Obstructive sleep apnea 05/30/2009   Essential hypertension 05/30/2009   CAD (coronary artery disease) 05/30/2009    Immunization History  Administered Date(s) Administered   Fluad Quad(high Dose 65+) 01/07/2021   Influenza,inj,Quad PF,6+ Mos 01/13/2017   PFIZER(Purple Top)SARS-COV-2 Vaccination 12/20/2019    Conditions to be addressed/monitored:  HTN w/CKD, CAD, Hx of Stroke, HLD  There are no care plans that you recently modified to display for this patient.     Medication Assistance: Application for Eliquis  medication assistance program. in process.  Anticipated assistance start date unknown.  See plan of care for additional detail.  Compliance/Adherence/Medication fill history: Care Gaps: Pneumonia vaccine  Star-Rating Drugs: Rosuvastatin 20m 07/21/20 90ds  Patient's preferred pharmacy is:  [facility] [place] - [address] [place] [place] [address]Phone: [phone]: [phone]  Uses pill box? Yes - wife organizes Pt endorses 100% compliance  We discussed: Benefits of medication synchronization, packaging and delivery as well as enhanced pharmacist oversight with [facility]. Patient decided to:  He will check on insurance and let me know so we can determine cost difference in [facility] vs. [facility].  He is interested if cost is equal.  Verbal consent obtained for [facility] enhanced pharmacy services (medication synchronization, adherence packaging, delivery coordination). A medication sync plan was created to allow patient to get all medications delivered once every 30 to 90 days per patient preference. Patient understands they have freedom to choose pharmacy and clinical pharmacist will coordinate care between all prescribers and [facility].   Care Plan and Follow Up Patient  Decision:  Patient agrees to Care Plan and Follow-up.  Plan: The care management team will reach out to the patient again over the next 180 days.  CBeverly Milch PharmD Clinical Pharmacist [phone]    Current Barriers:  Unable to independently afford treatment regimen  Pharmacist Clinical Goal(s):  Patient will verbalize ability to afford treatment regimen maintain control of BP as evidenced by home monitoring  through collaboration with PharmD and provider.   Interventions: 1:1 collaboration with Allwardt, ARanda Evens PA-C regarding development and update of comprehensive plan of care as evidenced by provider attestation and co-signature Inter-disciplinary care team collaboration (see longitudinal plan of care) Comprehensive medication review performed; medication list updated in electronic medical record  Hypertension w/ Stage 3 CKD (BP goal <130/80) -Controlled -Current treatment: Amlodipine 597mdaily Metoprolol tartrate 2561mne-half tablet po bid -Medications previously tried: olmesartan -Current home readings: "normal" no logs today, has been normal at recent OV -Current dietary habits: sticks to a low sodium diet -Current exercise habits: minimal, active around the house does do occasional walking -Denies hypotensive/hypertensive symptoms -Educated on BP goals and benefits of medications for prevention of heart attack, stroke and kidney damage; Daily salt intake goal < 2300 mg; Exercise goal of 150  minutes per week; Importance of home blood pressure monitoring; -Counseled to monitor BP at home weekly, document, and provide log at future appointments -Denies any swelling currently from amlodipine -Recommended to continue current medication  Hyperlipidemia/CAD/Hx of Stroke: (LDL goal < 70) -Controlled -Current treatment: Rosuvastatin 23m daily Eliquis 517mBID -Medications previously tried: Lovastatin  -Educated on Cholesterol goals;  Benefits of statin for  ASCVD risk reduction; Importance of limiting foods high in cholesterol; Exercise goal of 150 minutes per week; -most recent LDL at goal -Recommended to continue current medication -Does mention copay barrier with Eliquis, he was unsure of income in office.  Printeed Eliquis PAP application to take home and bring back if he determines he is eligible.  Patient Goals/Self-Care Activities Patient will:  - take medications as prescribed focus on medication adherence by pill box collaborate with provider on medication access solutions  Follow Up Plan: The care management team will reach out to the patient again over the next 180 days.

## 2021-07-08 ENCOUNTER — Telehealth: Payer: Medicare Other

## 2021-07-09 NOTE — Telephone Encounter (Signed)
Second unsuccessful telephone encounter to patient at both given numbers to discuss loop recorder at RRT. [employer] compliant VM message left requesting call back to [phone].  ?

## 2021-07-10 NOTE — Telephone Encounter (Signed)
Patient wife called back returning call. She is aware of device reaching RRT and says the patient will want it taken out. Patient is aware to unplug monitor and she will like a call back about getting it taken out  ?

## 2021-07-17 ENCOUNTER — Telehealth: Payer: Self-pay

## 2021-07-17 NOTE — Telephone Encounter (Signed)
I spoke with the pt's wife to make her aware of "Urology" referral placed back in April. Contact info has been given to schedule appointment.

## 2021-07-17 NOTE — Telephone Encounter (Signed)
Spouse states patient was to be referred to a neurologist.  I do not see referral.  Please follow up in regard.

## 2021-08-18 ENCOUNTER — Ambulatory Visit: Payer: Medicare Other | Admitting: Internal Medicine

## 2021-08-18 ENCOUNTER — Encounter: Payer: Self-pay | Admitting: Internal Medicine

## 2021-08-18 VITALS — BP 124/76 | HR 65 | Ht 74.0 in | Wt 230.0 lb

## 2021-08-18 DIAGNOSIS — I10 Essential (primary) hypertension: Secondary | ICD-10-CM | POA: Diagnosis not present

## 2021-08-18 DIAGNOSIS — I63511 Cerebral infarction due to unspecified occlusion or stenosis of right middle cerebral artery: Secondary | ICD-10-CM | POA: Diagnosis not present

## 2021-08-18 DIAGNOSIS — I25709 Atherosclerosis of coronary artery bypass graft(s), unspecified, with unspecified angina pectoris: Secondary | ICD-10-CM

## 2021-08-18 NOTE — Progress Notes (Signed)
HPI [name] returns today for followup and to consider removal of his ILR. He has a h/o cryptogenic stroke s/p ILR insertion. He denies chest pain or sob. His ILR has reached ERI. He denies chest pain or sob. No syncope. No edema. He did not have palpitations.  Allergies  Allergen Reactions   Ramipril Cough     Current Outpatient Medications  Medication Sig Dispense Refill   acetaminophen (TYLENOL) 325 MG tablet Take 1-2 tablets (325-650 mg total) by mouth every 4 (four) hours as needed for mild pain.     amLODipine (NORVASC) 5 MG tablet Take 1 tablet (5 mg total) by mouth daily. 90 tablet 1   apixaban (ELIQUIS) 5 MG TABS tablet Take 1 tablet by mouth twice daily 180 tablet 1   furosemide (LASIX) 20 MG tablet Take 1/2 (one-half) tablet by mouth once daily 30 tablet 0   Menthol-Methyl Salicylate (MUSCLE RUB) 10-15 % CREA Apply 1 application topically 4 (four) times daily -  with meals and at bedtime. To neck and shoulders  0   metoprolol tartrate (LOPRESSOR) 25 MG tablet TAKE 1/2 TABLET BY MOUTH TWICE DAILY 90 tablet 3   Multiple Vitamin (MULTIVITAMIN) tablet Take 1 tablet by mouth daily. [employer] multi + Omega 3     niacin (NIASPAN) 1000 MG CR tablet      niacin (NIASPAN) 500 MG CR tablet Take 500 mg by mouth at bedtime.     Olmesartan Medoxomil (BENICAR PO)      pantoprazole (PROTONIX) 40 MG tablet Take 1 tablet by mouth once daily 90 tablet 0   tamsulosin (FLOMAX) 0.4 MG CAPS capsule Take 1 capsule (0.4 mg total) by mouth at bedtime. 90 capsule 0   Vitamin D, Ergocalciferol, (DRISDOL) 1.25 MG (50000 UNIT) CAPS capsule Take 1 capsule (50,000 Units total) by mouth every 7 (seven) days. 12 capsule 0   allopurinol (ZYLOPRIM) 100 MG tablet Take 2 tablets (200 mg total) by mouth at bedtime. 180 tablet 1   potassium chloride (KLOR-CON) 10 MEQ tablet Take 1 tablet (10 mEq total) by mouth daily. 90 tablet 1   rosuvastatin (CRESTOR) 10 MG tablet Take 1 tablet (10 mg total) by mouth at  bedtime. 90 tablet 2   No current facility-administered medications for this visit.     Past Medical History:  Diagnosis Date   CAD (coronary artery disease)    s/p cath in 2005 and CABG x4   Cataract    DJD (degenerative joint disease)    DJD (degenerative joint disease)    Fluid retention    mild   H/O: gout    History of blood clots    HTN (hypertension)    Hyperlipidemia    Hyperlipidemia    Myocardial infarction [facility]) 2009   Obesity    with reduction   OSA (obstructive sleep apnea)    AHI 98/hr   Renal insufficiency    Sleep apnea    Stroke ([place]) 2019    ROS:   All systems reviewed and negative except as noted in the HPI.   Past Surgical History:  Procedure Laterality Date   CORONARY ARTERY BYPASS GRAFT  2005   LIMA to LAD, SVG to OM1 and OM 2, RCA.    left inguinal hernia repair     LOOP RECORDER INSERTION N/A 01/03/2018   Procedure: LOOP RECORDER INSERTION;  Surgeon: Evans Lance, MD;  Location: [place] CV LAB;  Service: Cardiovascular;  Laterality: N/A;   TEE  WITHOUT CARDIOVERSION N/A 12/11/2016   Procedure: TRANSESOPHAGEAL ECHOCARDIOGRAM (TEE);  Surgeon: [name], MD;  Location: [facility] ENDOSCOPY;  Service: Cardiovascular;  Laterality: N/A;   TEE WITHOUT CARDIOVERSION N/A 01/03/2018   Procedure: TRANSESOPHAGEAL ECHOCARDIOGRAM (TEE);  Surgeon: Dorothy Spark, MD;  Location: [facility] ENDOSCOPY;  Service: Cardiovascular;  Laterality: N/A;     Family History  Problem Relation Age of Onset   Gout Mother    Stroke Mother    Coronary artery disease Mother    Hypertension Mother    Arthritis Mother    Coronary artery disease Father    Arthritis Father    Gout Father    Stroke Father    Hypertension Father    Coronary artery disease Brother    Arthritis Brother    Gout Brother    Stroke Brother    Hypertension Brother    Coronary artery disease Brother    Arthritis Brother    Gout Brother    Stroke Brother    Hypertension Brother     Coronary artery disease Other        significant in multiple family members     Social History   Socioeconomic History   Marital status: Married    Spouse name: [name]   Number of children: Not on file   Years of education: Not on file   Highest education level: Not on file  Occupational History   Not on file  Tobacco Use   Smoking status: Former    Types: Cigars   Smokeless tobacco: Never   Tobacco comments:    used to smoke cigars, 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, none since  Vaping Use   Vaping Use: Never used  Substance and Sexual Activity   Alcohol use: No   Drug use: No   Sexual activity: Not on file  Other Topics Concern   Not on file  Social History Narrative   Not on file   Social Determinants of Health   Financial Resource Strain: Low Risk  (12/24/2020)   Overall Financial Resource Strain (CARDIA)    Difficulty of Paying Living Expenses: Not very hard  Food Insecurity: Not on file  Transportation Needs: Not on file  Physical Activity: Not on file  Stress: Not on file  Social Connections: Not on file  Intimate Partner Violence: Not on file     BP 124/76   Pulse 65   Ht 6\' 2"  (1.88 m)   Wt 230 lb (104.3 kg)   SpO2 98%   BMI 29.53 kg/m   Physical Exam:  Well appearing NAD HEENT: Unremarkable Neck:  No JVD, no thyromegally Lymphatics:  No adenopathy Back:  No CVA tenderness Lungs:  Clear HEART:  Regular rate rhythm, no murmurs, no rubs, no clicks Abd:  soft, positive bowel sounds, no organomegally, no rebound, no guarding Ext:  2 plus pulses, no edema, no cyanosis, no clubbing Skin:  No rashes no nodules Neuro:  CN II through XII intact, motor grossly intact   Assess/Plan:  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 His device has reached end of service. I have offered him ILR removal. He would like to hold off on this for now. He will call us if he changes his mind.  HTN - his bp is well controlled. We will follow.  Carleene Overlie  Jaysiah Marchetta,MD

## 2021-08-18 NOTE — Patient Instructions (Signed)
Medication Instructions:  Your physician recommends that you continue on your current medications as directed. Please refer to the Current Medication list given to you today.  Labwork: None ordered.  Testing/Procedures: None ordered.  Follow-Up: Your physician wants you to follow-up in: AS NEEDED with Cristopher Peru, MD or one of the following Advanced Practice Providers on your designated Care Team:   Tommye Standard, [place] Legrand Como "Jonni Sanger" Chalmers Cater, [place]    Any Other Special Instructions Will Be Listed Below (If Applicable).  If you need a refill on your cardiac medications before your next appointment, please call your pharmacy.   Important Information About Sugar

## 2021-08-20 ENCOUNTER — Other Ambulatory Visit: Payer: Self-pay | Admitting: Physician Assistant

## 2021-08-25 ENCOUNTER — Other Ambulatory Visit: Payer: Self-pay | Admitting: Physician Assistant

## 2021-08-25 ENCOUNTER — Other Ambulatory Visit: Payer: Self-pay | Admitting: Internal Medicine

## 2021-08-25 DIAGNOSIS — E559 Vitamin D deficiency, unspecified: Secondary | ICD-10-CM

## 2021-09-01 ENCOUNTER — Other Ambulatory Visit: Payer: Self-pay | Admitting: Physician Assistant

## 2021-09-01 DIAGNOSIS — E559 Vitamin D deficiency, unspecified: Secondary | ICD-10-CM

## 2021-09-09 ENCOUNTER — Other Ambulatory Visit: Payer: Self-pay | Admitting: Physician Assistant

## 2021-09-09 DIAGNOSIS — E559 Vitamin D deficiency, unspecified: Secondary | ICD-10-CM

## 2021-09-10 ENCOUNTER — Ambulatory Visit: Payer: Medicare Other | Admitting: Podiatry

## 2021-09-10 ENCOUNTER — Encounter: Payer: Self-pay | Admitting: Podiatry

## 2021-09-10 DIAGNOSIS — M79674 Pain in right toe(s): Secondary | ICD-10-CM | POA: Diagnosis not present

## 2021-09-10 DIAGNOSIS — I739 Peripheral vascular disease, unspecified: Secondary | ICD-10-CM | POA: Diagnosis not present

## 2021-09-10 DIAGNOSIS — D689 Coagulation defect, unspecified: Secondary | ICD-10-CM

## 2021-09-10 DIAGNOSIS — M79675 Pain in left toe(s): Secondary | ICD-10-CM

## 2021-09-10 DIAGNOSIS — L84 Corns and callosities: Secondary | ICD-10-CM

## 2021-09-10 DIAGNOSIS — B351 Tinea unguium: Secondary | ICD-10-CM | POA: Diagnosis not present

## 2021-09-10 NOTE — Progress Notes (Signed)
This patient returns to my office for at risk foot care.  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  Patient is taking eliquis.  This patient is unable to cut nails himself since the patient cannot reach his nails.These nails are painful walking and wearing shoes.  Patient has not been seen in over 17 months.This patient presents for at risk foot care today.  General Appearance  Alert, conversant and in no acute stress.  Vascular  Dorsalis pedis  are weakly palpable bilaterally.  Posterior tibial pulses are absent due to swelling. Posterior tibial pulses are not palpable.Capillary return is within normal limits  bilaterally. Temperature is within normal limits  bilaterally.  Neurologic  Senn-Weinstein monofilament wire test within normal limits  bilaterally. Muscle power within normal limits bilaterally.  Nails Thick disfigured discolored nails with subungual debris  from hallux to fifth toes bilaterally. No evidence of bacterial infection or drainage bilaterally.  Orthopedic  No limitations of motion  feet .  No crepitus or effusions noted.  No bony pathology or digital deformities noted.  HAV  B/L.  Skin  normotropic skin with no porokeratosis noted bilaterally.  No signs of infections or ulcers noted.   Pinch callus  B/L.  Onychomycosis  Pain in right toes  Pain in left toes  Pinch callus.  Consent was obtained for treatment procedures.   Mechanical debridement of nails 1-5  bilaterally performed with a nail nipper.  Filed with dremel without incident.  Debride callus with dremel.   Return office visit    3 months                 Told patient to return for periodic foot care and evaluation due to potential at risk complications.   Gardiner Barefoot DPM

## 2021-09-12 ENCOUNTER — Other Ambulatory Visit: Payer: Self-pay | Admitting: Internal Medicine

## 2021-10-27 ENCOUNTER — Ambulatory Visit: Payer: Medicare Other | Admitting: Podiatry

## 2021-10-29 ENCOUNTER — Encounter: Payer: Self-pay | Admitting: Podiatry

## 2021-10-29 ENCOUNTER — Ambulatory Visit: Payer: Medicare Other | Admitting: Podiatry

## 2021-10-29 DIAGNOSIS — M79675 Pain in left toe(s): Secondary | ICD-10-CM | POA: Diagnosis not present

## 2021-10-29 DIAGNOSIS — B351 Tinea unguium: Secondary | ICD-10-CM

## 2021-10-29 DIAGNOSIS — M79674 Pain in right toe(s): Secondary | ICD-10-CM

## 2021-10-29 NOTE — Progress Notes (Signed)
This patient returns to my office for at risk foot care.  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  Patient is taking eliquis.  This patient is unable to cut nails himself since the patient cannot reach his nails.These nails are painful walking and wearing shoes.  Patient has not been seen in over 17 months.This patient presents for at risk foot care today.  General Appearance  Alert, conversant and in no acute stress.  Vascular  Dorsalis pedis  are weakly palpable bilaterally.  Posterior tibial pulses are absent due to swelling. Posterior tibial pulses are not palpable.Capillary return is within normal limits  bilaterally. Temperature is within normal limits  bilaterally.  Neurologic  Senn-Weinstein monofilament wire test within normal limits  bilaterally. Muscle power within normal limits bilaterally.  Nails Thick disfigured discolored nails with subungual debris  from hallux to fifth toes bilaterally. No evidence of bacterial infection or drainage bilaterally.  Orthopedic  No limitations of motion  feet .  No crepitus or effusions noted.  No bony pathology or digital deformities noted.  HAV  B/L.  Skin  normotropic skin with no porokeratosis noted bilaterally.  No signs of infections or ulcers noted.   Pinch callus  B/L.  Onychomycosis  Pain in right toes  Pain in left toes  Pinch callus.  Consent was obtained for treatment procedures.   Mechanical debridement of nails 1-5  bilaterally performed with a nail nipper.  Filed with dremel without incident.  Debride callus with dremel.   Return office visit    3 months                 Told patient to return for periodic foot care and evaluation due to potential at risk complications.   Boneta Lucks D.P.M.

## 2021-11-05 ENCOUNTER — Other Ambulatory Visit: Payer: Self-pay | Admitting: Physician Assistant

## 2021-11-10 ENCOUNTER — Other Ambulatory Visit: Payer: Self-pay | Admitting: Physician Assistant

## 2021-11-14 ENCOUNTER — Ambulatory Visit: Payer: Medicare Other | Admitting: Podiatry

## 2021-11-20 ENCOUNTER — Other Ambulatory Visit: Payer: Self-pay | Admitting: Physician Assistant

## 2021-12-02 ENCOUNTER — Other Ambulatory Visit: Payer: Self-pay | Admitting: Physician Assistant

## 2022-01-30 ENCOUNTER — Ambulatory Visit: Payer: Medicare Other | Admitting: Podiatry

## 2022-01-30 ENCOUNTER — Encounter: Payer: Self-pay | Admitting: Podiatry

## 2022-01-30 VITALS — BP 152/80

## 2022-01-30 DIAGNOSIS — M79675 Pain in left toe(s): Secondary | ICD-10-CM | POA: Diagnosis not present

## 2022-01-30 DIAGNOSIS — I739 Peripheral vascular disease, unspecified: Secondary | ICD-10-CM

## 2022-01-30 DIAGNOSIS — M79674 Pain in right toe(s): Secondary | ICD-10-CM

## 2022-01-30 DIAGNOSIS — B351 Tinea unguium: Secondary | ICD-10-CM | POA: Diagnosis not present

## 2022-01-30 DIAGNOSIS — D689 Coagulation defect, unspecified: Secondary | ICD-10-CM

## 2022-01-30 DIAGNOSIS — M2042 Other hammer toe(s) (acquired), left foot: Secondary | ICD-10-CM

## 2022-01-30 DIAGNOSIS — M2041 Other hammer toe(s) (acquired), right foot: Secondary | ICD-10-CM

## 2022-01-30 DIAGNOSIS — N182 Chronic kidney disease, stage 2 (mild): Secondary | ICD-10-CM

## 2022-01-30 DIAGNOSIS — L84 Corns and callosities: Secondary | ICD-10-CM

## 2022-01-30 NOTE — Progress Notes (Addendum)
This patient returns to my office for at risk foot care.  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  Patient is taking eliquis.  This patient is unable to cut nails himself since the patient cannot reach his nails.These nails are painful walking and wearing shoes.  This patient presents for at risk foot care today.  General Appearance  Alert, conversant and in no acute stress.  Vascular  Dorsalis pedis  are weakly palpable bilaterally.  Posterior tibial pulses are absent due to swelling. Posterior tibial pulses are not palpable.Capillary return is within normal limits  bilaterally. Temperature is within normal limits  bilaterally.  Neurologic  Senn-Weinstein monofilament wire test within normal limits  bilaterally. Muscle power within normal limits bilaterally.  Nails Thick disfigured discolored nails with subungual debris  from hallux to fifth toes bilaterally. No evidence of bacterial infection or drainage bilaterally.  Orthopedic  No limitations of motion  feet .  No crepitus or effusions noted.  No bony pathology or digital deformities noted.  HAV  B/L.  Skin  normotropic skin with no porokeratosis noted bilaterally.  No signs of infections or ulcers noted.   Clavi 3rd toe right foot.  Onychomycosis  Pain in right toes  Pain in left toes  Clavi third toe right foot.  Consent was obtained for treatment procedures.   Mechanical debridement of nails 1-5  bilaterally performed with a nail nipper.  Filed with dremel without incident.  Debride callus with dremel.   Return office visit    3 months                 Told patient to return for periodic foot care and evaluation due to potential at risk complications.   Gardiner Barefoot DPM

## 2022-01-31 ENCOUNTER — Other Ambulatory Visit: Payer: Self-pay | Admitting: Internal Medicine

## 2022-02-02 NOTE — Telephone Encounter (Signed)
Prescription refill request for Eliquis received. Indication:cva Last office visit:6/23 Scr:1.3 Age: 81 Weight:104.3  kg  Prescription refilled

## 2022-02-03 ENCOUNTER — Telehealth: Payer: Self-pay

## 2022-02-03 NOTE — Telephone Encounter (Signed)
Patients wife walked into office today stating that patient is almost out of Eliquis (has 4 tablets left) and reports that his refill at the pharmacy is 400+$. Patients wife reports they can not afford this. No samples available here at the [facility] office, advised I would check over at [place] patients wife patient assistance application and they will fill out and return to see if patient could qualify for assistance. Advised patients wife we would be in touch regarding samples. Patients wife verbalized understanding.

## 2022-02-04 NOTE — Telephone Encounter (Signed)
Called patient back to let him know we have 2 weeks of samples for him and he can pick up at the [facility] location. Patient verbalized understanding.

## 2022-02-11 ENCOUNTER — Ambulatory Visit: Payer: Medicare Other | Admitting: Family Medicine

## 2022-02-11 ENCOUNTER — Ambulatory Visit: Payer: Self-pay

## 2022-02-11 ENCOUNTER — Ambulatory Visit (INDEPENDENT_AMBULATORY_CARE_PROVIDER_SITE_OTHER): Payer: Medicare Other

## 2022-02-11 VITALS — BP 180/100 | HR 74 | Ht 74.0 in

## 2022-02-11 DIAGNOSIS — M25512 Pain in left shoulder: Secondary | ICD-10-CM

## 2022-02-11 DIAGNOSIS — M2042 Other hammer toe(s) (acquired), left foot: Secondary | ICD-10-CM

## 2022-02-11 DIAGNOSIS — M2041 Other hammer toe(s) (acquired), right foot: Secondary | ICD-10-CM | POA: Diagnosis not present

## 2022-02-11 NOTE — Patient Instructions (Signed)
Thank you for coming in today.   Call or go to the ER if you develop a large red swollen joint with extreme pain or oozing puss.    I've referred you to Physical Therapy.  Let us know if you don't hear from them in one week.   Recheck in 6 weeks.   Let me know sooner if this is not working.

## 2022-02-11 NOTE — Progress Notes (Unsigned)
   I, [name], LAT, ATC acting as a scribe for Shane Leader, MD.  Subjective:    CC: L shoulder pain  HPI: Pt is a 81 y/o male c/o L shoulder pain ongoing since Saturday, 12/9. Pt thinks he may have strained something in his L shoulder when he was getting out of the car. Pt locates pain to the superior aspect of the L GH joint, but all over the joint.  Radiates: no UE Numbness/tingling: no UE Weakness: no Aggravates: using his arm to pull him up the stairs Treatments tried: Tylenol, heat, cream  Dx imaging: 01/04/18 C-spine CT  Pertinent review of Systems: ***  Relevant historical information: ***   Objective:   There were no vitals filed for this visit. General: Well Developed, well nourished, and in no acute distress.   MSK: ***  Lab and Radiology Results No results found for this or any previous visit (from the past 72 hour(s)). No results found.    Impression and Recommendations:    Assessment and Plan: 81 y.o. male with ***.  PDMP not reviewed this encounter. No orders of the defined types were placed in this encounter.  No orders of the defined types were placed in this encounter.   Discussed warning signs or symptoms. Please see discharge instructions. Patient expresses understanding.   ***

## 2022-02-13 NOTE — Progress Notes (Signed)
Left shoulder x-ray shows arthritis changes

## 2022-02-16 ENCOUNTER — Encounter (HOSPITAL_BASED_OUTPATIENT_CLINIC_OR_DEPARTMENT_OTHER): Payer: Self-pay | Admitting: Urology

## 2022-02-16 ENCOUNTER — Emergency Department (HOSPITAL_COMMUNITY): Payer: Medicare Other

## 2022-02-16 ENCOUNTER — Emergency Department (HOSPITAL_BASED_OUTPATIENT_CLINIC_OR_DEPARTMENT_OTHER)
Admission: EM | Admit: 2022-02-16 | Discharge: 2022-02-17 | Discharge status: Against Medical Advice | Payer: Medicare Other | Attending: Emergency Medicine | Admitting: Emergency Medicine

## 2022-02-16 ENCOUNTER — Ambulatory Visit (INDEPENDENT_AMBULATORY_CARE_PROVIDER_SITE_OTHER): Payer: Medicare Other | Admitting: Sports Medicine

## 2022-02-16 ENCOUNTER — Ambulatory Visit: Payer: Medicare Other

## 2022-02-16 ENCOUNTER — Telehealth: Payer: Self-pay | Admitting: Family Medicine

## 2022-02-16 ENCOUNTER — Ambulatory Visit (INDEPENDENT_AMBULATORY_CARE_PROVIDER_SITE_OTHER): Payer: Medicare Other

## 2022-02-16 ENCOUNTER — Other Ambulatory Visit: Payer: Self-pay

## 2022-02-16 ENCOUNTER — Emergency Department (HOSPITAL_BASED_OUTPATIENT_CLINIC_OR_DEPARTMENT_OTHER): Payer: Medicare Other

## 2022-02-16 VITALS — BP 126/82 | Ht 74.0 in | Wt 230.0 lb

## 2022-02-16 DIAGNOSIS — R479 Unspecified speech disturbances: Secondary | ICD-10-CM

## 2022-02-16 DIAGNOSIS — K117 Disturbances of salivary secretion: Secondary | ICD-10-CM

## 2022-02-16 DIAGNOSIS — Z8673 Personal history of transient ischemic attack (TIA), and cerebral infarction without residual deficits: Secondary | ICD-10-CM | POA: Diagnosis not present

## 2022-02-16 DIAGNOSIS — Z7901 Long term (current) use of anticoagulants: Secondary | ICD-10-CM | POA: Insufficient documentation

## 2022-02-16 DIAGNOSIS — Z79899 Other long term (current) drug therapy: Secondary | ICD-10-CM | POA: Diagnosis not present

## 2022-02-16 DIAGNOSIS — I2510 Atherosclerotic heart disease of native coronary artery without angina pectoris: Secondary | ICD-10-CM | POA: Diagnosis not present

## 2022-02-16 DIAGNOSIS — M25512 Pain in left shoulder: Secondary | ICD-10-CM | POA: Diagnosis not present

## 2022-02-16 DIAGNOSIS — I129 Hypertensive chronic kidney disease with stage 1 through stage 4 chronic kidney disease, or unspecified chronic kidney disease: Secondary | ICD-10-CM | POA: Diagnosis not present

## 2022-02-16 DIAGNOSIS — R269 Unspecified abnormalities of gait and mobility: Secondary | ICD-10-CM

## 2022-02-16 DIAGNOSIS — R29898 Other symptoms and signs involving the musculoskeletal system: Secondary | ICD-10-CM

## 2022-02-16 DIAGNOSIS — R001 Bradycardia, unspecified: Secondary | ICD-10-CM | POA: Insufficient documentation

## 2022-02-16 DIAGNOSIS — R531 Weakness: Secondary | ICD-10-CM | POA: Diagnosis present

## 2022-02-16 DIAGNOSIS — N189 Chronic kidney disease, unspecified: Secondary | ICD-10-CM | POA: Diagnosis not present

## 2022-02-16 LAB — COMPREHENSIVE METABOLIC PANEL
ALT: 15 U/L (ref 0–44)
AST: 18 U/L (ref 15–41)
Albumin: 3.4 g/dL — ABNORMAL LOW (ref 3.5–5.0)
Alkaline Phosphatase: 49 U/L (ref 38–126)
Anion gap: 6 (ref 5–15)
BUN: 19 mg/dL (ref 8–23)
CO2: 29 mmol/L (ref 22–32)
Calcium: 9.6 mg/dL (ref 8.9–10.3)
Chloride: 107 mmol/L (ref 98–111)
Creatinine, Ser: 1.22 mg/dL (ref 0.61–1.24)
GFR, Estimated: >60 mL/min (ref >60)
Glucose, Bld: 103 mg/dL — ABNORMAL HIGH (ref 70–99)
Potassium: 3.9 mmol/L (ref 3.5–5.1)
Sodium: 142 mmol/L (ref 135–145)
Total Bilirubin: 0.6 mg/dL (ref 0.3–1.2)
Total Protein: 7.4 g/dL (ref 6.5–8.1)

## 2022-02-16 LAB — DIFFERENTIAL
Abs Immature Granulocytes: 0.05 10*3/uL (ref 0.00–0.07)
Basophils Absolute: 0 10*3/uL (ref 0.0–0.1)
Basophils Relative: 0 %
Eosinophils Absolute: 0 10*3/uL (ref 0.0–0.5)
Eosinophils Relative: 0 %
Immature Granulocytes: 1 %
Lymphocytes Relative: 15 %
Lymphs Abs: 1.4 10*3/uL (ref 0.7–4.0)
Monocytes Absolute: 0.9 10*3/uL (ref 0.1–1.0)
Monocytes Relative: 10 %
Neutro Abs: 6.9 10*3/uL (ref 1.7–7.7)
Neutrophils Relative %: 74 %

## 2022-02-16 LAB — PROTIME-INR
INR: 1.3 — ABNORMAL HIGH (ref 0.8–1.2)
Prothrombin Time: 15.7 seconds — ABNORMAL HIGH (ref 11.4–15.2)

## 2022-02-16 LAB — CBC
HCT: 42.6 % (ref 39.0–52.0)
Hemoglobin: 14 g/dL (ref 13.0–17.0)
MCH: 28.9 pg (ref 26.0–34.0)
MCHC: 32.9 g/dL (ref 30.0–36.0)
MCV: 87.8 fL (ref 80.0–100.0)
Platelets: 260 10*3/uL (ref 150–400)
RBC: 4.85 MIL/uL (ref 4.22–5.81)
RDW: 14.1 % (ref 11.5–15.5)
WBC: 9.3 10*3/uL (ref 4.0–10.5)
nRBC: 0 % (ref 0.0–0.2)

## 2022-02-16 LAB — ETHANOL: Alcohol, Ethyl (B): <10 mg/dL (ref <10)

## 2022-02-16 LAB — TROPONIN I (HIGH SENSITIVITY): Troponin I (High Sensitivity): 14 ng/L (ref <18)

## 2022-02-16 LAB — APTT: aPTT: 33 seconds (ref 24–36)

## 2022-02-16 NOTE — Discharge Instructions (Signed)
Go straight to [facility] Emergency Department. You will need an MRI of your brain to rule out a stroke

## 2022-02-16 NOTE — ED Notes (Signed)
Pt provided with instructions to travel POV to [facility] for MRI. Instructed to go straight to ED. NAD at time of departure.

## 2022-02-16 NOTE — ED Notes (Signed)
[facility] charge notified of patient POV transport to ED for MRI.

## 2022-02-16 NOTE — ED Provider Notes (Signed)
[facility] EMERGENCY DEPARTMENT Provider Note   CSN: [id number] Arrival date & time: 02/16/22  1625     History  No chief complaint on file.   [name] is a 81 y.o. male with a past medical history significant for CAD, hyperlipidemia, previous CVA, hypertension, hyperlipidemia, CKD who presents to the ED due to ongoing left shoulder/arm pain associated with left upper extremity weakness.  Patient states pain has been ongoing for about 1.5 weeks.  Patient had a left shoulder injection on 12/13. Per Sport medicine note, it appears shoulder pain has been present since 12/9 after straining it while getting out a car. During his office visit on 12/13, it was documented that patient had 4/5 strength of LUE. Patient states steroid injection did not improve pain and he was seen by sports medicine again today.  Patient sent to ED to rule out CVA given left upper extremity weakness, change in gait, and changes in speech.  Wife at bedside states that patient's speech over the weekend has slightly changed to more "stuttering" and more slurred than his baseline.  Patient states he has no deficits from his previous CVA except some occasional stuttering.  Denies visual changes.  No recent head injury. Denies chest pain and shortness of breath.   History obtained from patient and past medical records. No interpreter used during encounter.       Home Medications Prior to Admission medications   Medication Sig Start Date End Date Taking? Authorizing Provider  acetaminophen (TYLENOL) 325 MG tablet Take 1-2 tablets (325-650 mg total) by mouth every 4 (four) hours as needed for mild pain. 01/21/18   Love, Ivan Anchors, PA-C  allopurinol (ZYLOPRIM) 100 MG tablet Take 2 tablets by mouth once daily 11/21/21   Allwardt, Alyssa M, PA-C  amLODipine (NORVASC) 5 MG tablet Take 1 tablet by mouth once daily 11/06/21   Allwardt, Randa Evens, PA-C  apixaban (ELIQUIS) 5 MG TABS tablet Take 1 tablet by mouth twice  daily 02/02/22   Hilty, Nadean Corwin, MD  furosemide (LASIX) 20 MG tablet Take 1/2 (one-half) tablet by mouth once daily 09/15/21   Evans Lance, MD  Menthol-Methyl Salicylate (MUSCLE RUB) 10-15 % CREA Apply 1 application topically 4 (four) times daily -  with meals and at bedtime. To neck and shoulders 01/21/18   Love, Ivan Anchors, PA-C  metoprolol tartrate (LOPRESSOR) 25 MG tablet TAKE 1/2 TABLET BY MOUTH TWICE DAILY 07/07/21   Hilty, Nadean Corwin, MD  Multiple Vitamin (MULTIVITAMIN) tablet Take 1 tablet by mouth daily. [employer] multi + Omega 3    [provider]  niacin (NIASPAN) 1000 MG CR tablet     [provider]  niacin (NIASPAN) 500 MG CR tablet Take 500 mg by mouth at bedtime.    [provider]  Olmesartan Medoxomil (BENICAR PO)     [provider]  pantoprazole (PROTONIX) 40 MG tablet Take 1 tablet by mouth once daily 12/02/21   Allwardt, Alyssa M, PA-C  potassium chloride (KLOR-CON) 10 MEQ tablet Take 1 tablet (10 mEq total) by mouth daily. 10/31/20 01/29/21  Allwardt, Randa Evens, PA-C  rosuvastatin (CRESTOR) 10 MG tablet Take 1 tablet (10 mg total) by mouth at bedtime. 02/07/21 05/08/21  Allwardt, Randa Evens, PA-C  tamsulosin (FLOMAX) 0.4 MG CAPS capsule Take 1 capsule by mouth at bedtime 11/21/21   Allwardt, Alyssa M, PA-C  Vitamin D, Ergocalciferol, (DRISDOL) 1.25 MG (50000 UNIT) CAPS capsule Take 1 capsule (50,000 Units total) by mouth every  7 (seven) days. 06/23/21   Allwardt, Randa Evens, PA-C      Allergies    Ramipril    Review of Systems   Review of Systems  Eyes:  Negative for visual disturbance.  Respiratory:  Negative for shortness of breath.   Cardiovascular:  Negative for chest pain.  Musculoskeletal:  Positive for arthralgias.  Neurological:  Positive for speech difficulty and weakness. Negative for headaches.  All other systems reviewed and are negative.   Physical Exam Updated Vital Signs BP (!) 170/99   Pulse (!) 51   Temp 98.2 F (36.8 C) (Oral)    Resp 18   Ht 6\' 2"  (1.88 m)   Wt 104.3 kg   SpO2 99%   BMI 29.53 kg/m  Physical Exam Vitals and nursing note reviewed.  Constitutional:      General: He is not in acute distress.    Appearance: He is not ill-appearing.  HENT:     Head: Normocephalic.  Eyes:     Pupils: Pupils are equal, round, and reactive to light.  Cardiovascular:     Rate and Rhythm: Normal rate and regular rhythm.     Pulses: Normal pulses.     Heart sounds: Normal heart sounds. No murmur heard.    No friction rub. No gallop.  Pulmonary:     Effort: Pulmonary effort is normal.     Breath sounds: Normal breath sounds.  Abdominal:     General: Abdomen is flat. There is no distension.     Palpations: Abdomen is soft.     Tenderness: There is no abdominal tenderness. There is no guarding or rebound.  Musculoskeletal:        General: Normal range of motion.     Cervical back: Neck supple.     Comments: Decreased left grip strength. No tenderness throughout left shoulder. Full ROM of left shoulder.   Skin:    General: Skin is warm and dry.  Neurological:     General: No focal deficit present.     Mental Status: He is alert.     Comments: Decreased left grip strength. No facial droop. Slight slurred speech, but unsure what patient's baseline is.   Psychiatric:        Mood and Affect: Mood normal.        Behavior: Behavior normal.     ED Results / Procedures / Treatments   Labs (all labs ordered are listed, but only abnormal results are displayed) Labs Reviewed  PROTIME-INR - Abnormal; Notable for the following components:      Result Value   Prothrombin Time 15.7 (*)    INR 1.3 (*)    All other components within normal limits  COMPREHENSIVE METABOLIC PANEL - Abnormal; Notable for the following components:   Glucose, Bld 103 (*)    Albumin 3.4 (*)    All other components within normal limits  ETHANOL  APTT  CBC  DIFFERENTIAL  RAPID URINE DRUG SCREEN, HOSP PERFORMED  URINALYSIS, ROUTINE W  REFLEX MICROSCOPIC  TROPONIN I (HIGH SENSITIVITY)    EKG EKG Interpretation  Date/Time:  Monday February 16 2022 16:52:01 EST Ventricular Rate:  51 PR Interval:  200 QRS Duration: 92 QT Interval:  368 QTC Calculation: 339 R Axis:   6 Text Interpretation: Sinus bradycardia Nonspecific T wave abnormality Abnormal ECG When compared with ECG of 01-Jan-2018 03:56, PREVIOUS ECG IS PRESENT Confirmed by Tretha Sciara [id number] on 02/16/2022 6:04:22 PM  Radiology DG Cervical Spine 2 or 3 views  Result Date: 02/16/2022 CLINICAL DATA:  Recent left shoulder weakness. EXAM: CERVICAL SPINE - 2-3 VIEW COMPARISON:  Cervical spine radiographs 08/29/2009, CT cervical spine 01/01/2018 FINDINGS: There is 3 mm grade 1 anterolisthesis of C3 on C4. The atlantodens interval is intact. Vertebral body heights are maintained. Mild anterior greater than posterior C6-7 and minimal diffuse C4-5 disc space narrowing. Moderate anterior C2-3, C3-4, and C5-6 endplate osteophytes. No prevertebral soft tissue swelling. The lung apices are clear. Moderate atherosclerotic calcifications within the aortic arch. Partial visualization of median sternotomy wires. IMPRESSION: Mild-to-moderate multilevel degenerative disc and endplate changes, greatest at C6-7. Electronically Signed   By: Yvonne Kendall M.D.   On: 02/16/2022 18:19   CT HEAD WO CONTRAST  Result Date: 02/16/2022 CLINICAL DATA:  Concern for new CVA based on past medical history of CVA and new speech changes, new drooling, and new left upper extremity weakness. EXAM: CT HEAD WITHOUT CONTRAST TECHNIQUE: Contiguous axial images were obtained from the base of the skull through the vertex without intravenous contrast. RADIATION DOSE REDUCTION: This exam was performed according to the departmental dose-optimization program which includes automated exposure control, adjustment of the mA and/or kV according to patient size and/or use of iterative reconstruction technique.  COMPARISON:  CT brain 01/01/2018, MRI brain 01/01/2018 FINDINGS: Brain: There is mild cortical atrophy, within normal limits for patient age. The ventricles are normal in configuration. The basilar cisterns are patent. No mass, mass effect, or midline shift. No acute intracranial hemorrhage is seen. No abnormal extra-axial fluid collection. Note is made on prior 01/01/2018 MRI brain of restricted diffusion within the right inferolateral frontal lobe greater than the right parietal lobe and right occipital lobe. On the current CT, there is hypodensity and encephalomalacia within the right frontal and parietal white matter and the posterior right occipital lobe in the distribution of these prior infarcts. Remote infarcts of the right cerebellum and the right thalamus versus the posterior limb of the right internal capsule, unchanged. Remote right basal ganglia infarct with mild ex vacuo dilatation of the anterior horn of the right lateral ventricle, unchanged from 01/01/2017. Patchy periventricular and subcortical white matter hypodensities are similar to prior, likely chronic ischemic white matter changes. Vascular: Atherosclerotic calcifications are seen within the skull base arteries. Skull: Normal. Negative for fracture or focal lesion. Sinuses/Orbits: The right orbital globe is again unremarkable. 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 Minimal inferior medial left maxillary axillary sinus 5 point mucosal thickening is unchanged from 01/21/2018. Otherwise, the visualized paranasal sinuses and mastoid air cells are clear. Other: None. IMPRESSION: 1. No acute intracranial abnormality. 2. 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 3. Chronic right basal ganglia, right cerebellum, and right thalamus versus the posterior limb of the right internal capsule infarcts, unchanged from 01/01/2018 CT. Electronically Signed    By: Yvonne Kendall M.D.   On: 02/16/2022 18:13    Procedures Procedures    Medications Ordered in ED Medications - No data to display  ED Course/ Medical Decision Making/ A&P Clinical Course as of 02/16/22 1823  Mon Feb 16, 2022  1803 Stable hx of CVA Changes this weekend. Arm weakness. ED to ED for MRI [facility].  [CC]    Clinical Course User Index [CC] Tretha Sciara, MD  Medical Decision Making Amount and/or Complexity of Data Reviewed Independent Historian: spouse    Details: Wife at bedside provided history External Data Reviewed: notes.    Details: Sports medicine notes Labs: ordered. Decision-making details documented in ED Course. Radiology: ordered and independent interpretation performed. ECG/medicine tests: ordered and independent interpretation performed. Decision-making details documented in ED Course.   This patient presents to the ED for concern of LUE weakness, this involves an extensive number of treatment options, and is a complaint that carries with it a high risk of complications and morbidity.  The differential diagnosis includes CVA, cervical radiculopathy, shoulder etiology  81 year old male presents the ED due to left arm pain and weakness that has been ongoing for slightly over a week however, worsened over the weekend.  Wife at bedside states patient's speech has changed over the weekend.  History of previous CVA.  Patient has been seen by sports medicine for chronic left shoulder issues and had a steroid injection on 12/13.  No improvement in left arm pain.  Upon arrival, patient afebrile, bradycardic at 51 with elevated BP at 170/99. Patient in no acute distress. Decreased grip strength of left arm. Slight pronator drift. No facial droop. Stroke labs ordered. No evidence of infection in left shoulder. Doubt septic joint. Left shoulder x-ray ordered on 12/13 which was unremarkable. Troponin and EKG ordered to rule out atypical  ACS given left arm pain. Possible CVA vs. Cervical radiculopathy vs. Shoulder etiology?  CBC unremarkable.  No leukocytosis and normal hemoglobin.  CMP reassuring.  Normal renal function.  No major electrolyte derangements.  Troponin normal at 14.  EKG demonstrates sinus bradycardia.  Nonspecific T wave abnormalities.  Low suspicion for atypical ACS. CT negative for acute CVA. Shows remote infarcts.   5:59 PM Discussed with Dr. Quinn Axe with neurology who recommends ED to ED transfer for MRI brain. If positive for acute CVA, reconsult neurology.   Discussed with Dr. Francia Greaves who accepts patient for transfer.   Patient will be transported by wife. Advised patient to go straight to the ED for MRI. Discussed with Dr. Oswald Hillock who evaluated patient at bedside and agrees with assessment and plan.         Final Clinical Impression(s) / ED Diagnoses Final diagnoses:  Weakness of left upper extremity  Acute pain of left shoulder    Rx / DC Orders ED Discharge Orders     None         Karie Kirks 02/16/22 1827    Tretha Sciara, MD 02/16/22 [phone]

## 2022-02-16 NOTE — Telephone Encounter (Signed)
Patient's daughter called stating that Riely has been in a lot of pain over the weekend and they would like to know what else he can do for the pain or next steps? I told her that Dr Georgina Snell is out of the office but maybe we could check with another provider?  Can you help?

## 2022-02-16 NOTE — ED Triage Notes (Signed)
PCP sent for [facility] eval  Pt states left sided neck pain that started Friday  States Left side hand weakness over the weekend and was seen Saturday and given shot in arm   Slight weakness in left grip, no facial droop, normal speech, A&O x 4, normal sensation noted   H/o stroke

## 2022-02-16 NOTE — Progress Notes (Signed)
[name] [name].[name] Phone: [phone]   Assessment and Plan:     1. Left arm weakness 2. History of CVA (cerebrovascular accident) 3. Speech disturbance, unspecified type 4. Drooling 5. Abnormality of gait - Acute, complicated, subsequent sports medicine visit - Patient seen at [facility] sports medicine office by Dr. Glennon Mac, DO on 02/16/2022 at 3: 59 PM.  I have a high concern for new CVA based on past medical history of CVA, new speech changes, new drooling, new left upper extremity weakness, change in gait with patient unsteady, change in personality with patient becoming more aggressive and short tempered.  Based on all of these acute changes with past medical history of CVA, I advised patient and his wife that it is my recommendation that they are evaluated at the emergency department immediately.  No change in symptoms today, so I feel it is reasonable for patient's wife to drive him to the emergency room. -It is possible that patient's symptoms are cervical in origin versus rotator cuff in origin, however with past medical history and the totality patient's symptoms I feel it is necessary that he be evaluated for stroke at ER today.  No improvement in shoulder pain after CSI on 02/11/2022.  Moderate degenerative changes in cervical spine on x-ray today per below. - X-ray obtained in clinic today.  My interpretation: No acute fracture or vertebral collapse.  Significant degenerative changes in lower cervical spine most prominent at C5-C7 as well as degeneration seen on odontoid view on left side that is not present on right  Pertinent previous records reviewed include none   Time of visit 47 minutes, which included chart review, physical exam, treatment plan being performed, interpreted, and discussed with patient at today's visit.   Follow Up: After ER visit.  If patient's workup is negative for  CVA and patient returns at outpatient, could consider rotator cuff versus cervical origin for patient's pain   Subjective:   I, Pincus Badder, am serving as a Education administrator for Doctor Glennon Mac  Chief Complaint: left shoulder pain   HPI:  02/11/2022  HPI: Pt is a 81 y/o male c/o L shoulder pain ongoing since Saturday, 12/9. Pt thinks he may have strained something in his L shoulder when he was getting out of the car. Pt locates pain to the superior aspect of the L GH joint, but all over the joint.   Additionally has some questions about his hammertoes.  He is seeing a podiatrist who discussed some surgical options.  He has used some hammertoe pads recommended by the podiatrist which she found to be helpful.   Radiates: no UE Numbness/tingling: no UE Weakness: no Aggravates: using his arm to pull him up the stairs Treatments tried: Tylenol, heat, cream   Dx imaging: 01/04/18 C-spine CT  02/16/22 Patient states that he is terrible , constant pain that starts at the neck and radiates down to his hand , feels like it is in his muscle , pain in the ball and socket and goes down   Relevant Historical Information: History of CVA, history of DVT, hypertension, OSA, CKD  Additional pertinent review of systems negative.   Current Outpatient Medications:    acetaminophen (TYLENOL) 325 MG tablet, Take 1-2 tablets (325-650 mg total) by mouth every 4 (four) hours as needed for mild pain., Disp: , Rfl:    allopurinol (ZYLOPRIM) 100 MG tablet, Take 2 tablets by mouth once daily, Disp:  180 tablet, Rfl: 0   amLODipine (NORVASC) 5 MG tablet, Take 1 tablet by mouth once daily, Disp: 90 tablet, Rfl: 0   apixaban (ELIQUIS) 5 MG TABS tablet, Take 1 tablet by mouth twice daily, Disp: 180 tablet, Rfl: 1   furosemide (LASIX) 20 MG tablet, Take 1/2 (one-half) tablet by mouth once daily, Disp: 45 tablet, Rfl: 3   Menthol-Methyl Salicylate (MUSCLE RUB) 10-15 % CREA, Apply 1 application topically 4 (four)  times daily -  with meals and at bedtime. To neck and shoulders, Disp: , Rfl: 0   metoprolol tartrate (LOPRESSOR) 25 MG tablet, TAKE 1/2 TABLET BY MOUTH TWICE DAILY, Disp: 90 tablet, Rfl: 3   Multiple Vitamin (MULTIVITAMIN) tablet, Take 1 tablet by mouth daily. GNC multi + Omega 3, Disp: , Rfl:    niacin (NIASPAN) 1000 MG CR tablet, , Disp: , Rfl:    niacin (NIASPAN) 500 MG CR tablet, Take 500 mg by mouth at bedtime., Disp: , Rfl:    Olmesartan Medoxomil (BENICAR PO), , Disp: , Rfl:    pantoprazole (PROTONIX) 40 MG tablet, Take 1 tablet by mouth once daily, Disp: 90 tablet, Rfl: 0   tamsulosin (FLOMAX) 0.4 MG CAPS capsule, Take 1 capsule by mouth at bedtime, Disp: 90 capsule, Rfl: 0   Vitamin [name], Ergocalciferol, (DRISDOL) 1.25 MG (50000 UNIT) CAPS capsule, Take 1 capsule (50,000 Units total) by mouth every 7 (seven) days., Disp: 12 capsule, Rfl: 0   potassium chloride (KLOR-CON) 10 MEQ tablet, Take 1 tablet (10 mEq total) by mouth daily., Disp: 90 tablet, Rfl: 1   rosuvastatin (CRESTOR) 10 MG tablet, Take 1 tablet (10 mg total) by mouth at bedtime., Disp: 90 tablet, Rfl: 2   Objective:     Vitals:   02/16/22 1452  BP: 126/82  Weight: 230 lb (104.3 kg)  Height: 6\' 2"  (1.88 m)      Body mass index is 29.53 kg/m.    Physical Exam:    Gen: Appears well, nad, nontoxic and pleasant Neuro:sensation intact, strength is 4 /5 in left upper extremity compared to right with muscle tone generally decreased.  Lipsmacking with mild drooling present throughout examination. Skin: no suspicious lesion or defmority Psych: A&O, appropriate mood and affect.  Left shoulder: no deformity.  Generalized muscle wasting No scapular winging FF 40, abd 40, int 20, ext 60 NTTP over the [place], clavicle, ac, coracoid, biceps groove, humerus, deltoid, trapezius, cervical spine Unable to perform special testing due to limited ROM Neg sulcus sign Negative Spurling's test bilat Decreased sidebending bilaterally,  otherwise FROM of neck     Electronically signed by:  [name] [name].[facility] 3:46 PM 02/16/22

## 2022-02-16 NOTE — Patient Instructions (Addendum)
Patient seen at [facility] sports medicine office by Dr. Glennon Mac, DO on 02/16/2022 at 3:43 PM.  I have a high concern for new CVA based on past medical history of CVA, new speech changes, new drooling, new left upper extremity weakness, change in gait with patient unsteady, change in personality with patient becoming more aggressive and short tempered.  Based on all of these acute changes with past medical history of CVA, I advised patient and his wife that it is my recommendation that they are evaluated at the emergency department immediately.  No change in symptoms today, so I feel it is reasonable for patient's wife to drive him to the emergency room.

## 2022-02-16 NOTE — ED Notes (Signed)
Provider notified of pt symptoms

## 2022-02-17 ENCOUNTER — Telehealth: Payer: Self-pay | Admitting: Physician Assistant

## 2022-02-17 ENCOUNTER — Other Ambulatory Visit: Payer: Self-pay | Admitting: Physician Assistant

## 2022-02-17 MED ORDER — TIZANIDINE HCL 4 MG PO TABS: 4.0000 mg | ORAL_TABLET | Freq: Every day | ORAL | 0 refills | Status: DC

## 2022-02-17 NOTE — ED Notes (Signed)
No answer for room. Pt called x2

## 2022-02-17 NOTE — Telephone Encounter (Signed)
Daughter would like a call back with MRI and CT results for her father - pt. That was done at the ER. Please advise.

## 2022-02-17 NOTE — Addendum Note (Signed)
Addended by: Pincus Badder R on: 02/17/2022 12:26 PM   Modules accepted: Orders

## 2022-02-17 NOTE — Telephone Encounter (Signed)
Please see pt daughter message and advise

## 2022-02-18 ENCOUNTER — Emergency Department (HOSPITAL_COMMUNITY): Payer: Medicare Other

## 2022-02-18 ENCOUNTER — Telehealth: Payer: Self-pay | Admitting: Internal Medicine

## 2022-02-18 ENCOUNTER — Emergency Department (HOSPITAL_COMMUNITY)
Admission: EM | Admit: 2022-02-18 | Discharge: 2022-02-19 | Discharge status: Against Medical Advice | Payer: Medicare Other | Attending: Student | Admitting: Student

## 2022-02-18 ENCOUNTER — Encounter (HOSPITAL_COMMUNITY): Payer: Self-pay

## 2022-02-18 DIAGNOSIS — Z8673 Personal history of transient ischemic attack (TIA), and cerebral infarction without residual deficits: Secondary | ICD-10-CM | POA: Diagnosis not present

## 2022-02-18 DIAGNOSIS — M79602 Pain in left arm: Secondary | ICD-10-CM | POA: Diagnosis present

## 2022-02-18 DIAGNOSIS — Z5321 Procedure and treatment not carried out due to patient leaving prior to being seen by health care provider: Secondary | ICD-10-CM | POA: Diagnosis not present

## 2022-02-18 DIAGNOSIS — R200 Anesthesia of skin: Secondary | ICD-10-CM | POA: Insufficient documentation

## 2022-02-18 DIAGNOSIS — M549 Dorsalgia, unspecified: Secondary | ICD-10-CM | POA: Diagnosis not present

## 2022-02-18 DIAGNOSIS — R531 Weakness: Secondary | ICD-10-CM | POA: Diagnosis not present

## 2022-02-18 NOTE — Telephone Encounter (Signed)
Patient Name: [name] Gender: Male DOB: [birth year] Age: 81 Y 94 M 14 D Return Phone Number: [phone] (Primary), [phone] (Secondary), [phone] (Alternate) Address: City/ State/ Zip: [place]  [address] Client [facility] Client Site [facility] Paediatric nurse, Freight forwarder- PA Contact Type Call Who Is Calling Patient / Member / Family / Caregiver Call Type Triage / Clinical Caller Name Colletta Maryland Relationship To Patient Daughter Return Phone Number Please choose phone number Chief Complaint NUMBNESS/TINGLING- sudden on one side of the body or face Reason for Call Symptomatic / Request for Health Information Initial Comment Caller from the office states she has daughter on the line. She states pt was having loss of function on left side and is stuttering. They think he has had a stroke Translation No Nurse Assessment Nurse: Hassell Done, RN, Melanie Date/Time (Eastern Time): 02/18/2022 4:21:39 PM Confirm and document reason for call. If symptomatic, describe symptoms. ---Caller states he has pain around his neck and all the way down to his fingertips. He has had this for several weeks no strength in his L side at all. Does the patient have any new or worsening symptoms? ---Yes Will a triage be completed? ---Yes Related visit to physician within the last 2 weeks? ---Yes Does the PT have any chronic conditions? (i.e. diabetes, asthma, this includes High risk factors for pregnancy, etc.) ---No Is this a behavioral health or substance abuse call? ---No Guidelines Guideline Title Affirmed Question Affirmed Notes Nurse Date/Time (Eastern Time) Neurologic Deficit [1] Weakness (i.e., paralysis, loss of muscle strength) of the face, arm / hand, or leg / foot on one side of the body AND Hassell Done, RN, Threasa Beards 02/18/2022 4:23:59 PM PLEASE NOTE: All timestamps contained within this report are  represented as [place] Standard Time. CONFIDENTIALTY NOTICE: This fax transmission is intended only for the addressee. It contains information that is legally privileged, confidential or otherwise protected from use or disclosure. If you are not the intended recipient, you are strictly prohibited from reviewing, disclosing, copying using or disseminating any of this information or taking any action in reliance on or regarding this information. If you have received this fax in error, please notify us immediately by telephone so that we can arrange for its return to [place]. Phone: [phone], Toll-Free: [phone], Fax: [phone] Page: 2 of 2 Call Id: [id number] Guidelines Guideline Title Affirmed Question Affirmed Notes Nurse Date/Time Eilene Ghazi Time) [2] sudden onset AND [3] present now (Exception: Bell's palsy suspected [i.e., weakness only on one side of the face, developing over hours to days, no other symptoms].) Disp. Time Eilene Ghazi Time) Disposition Final User 02/18/2022 4:12:45 PM Send to Urgent Queue Zane Herald 02/18/2022 4:15:40 PM Attempt made - message left Dominica Severin, Threasa Beards 02/18/2022 4:16:45 PM Attempt made - no message left Arnaldo Natal 02/18/2022 4:19:31 PM Attempt made - no message left Arnaldo Natal 02/18/2022 4:20:19 PM Attempt made - no message left Arnaldo Natal 02/18/2022 4:32:59 PM Call EMS 911 Now Yes Hassell Done RN, Threasa Beards 02/18/2022 4:33:53 PM 911 Outcome Documentation Hassell Done RN, Threasa Beards Reason: 911 call back was made and there was no answer Final Disposition 02/18/2022 4:32:59 PM Call EMS 911 Now Yes Hassell Done, RN, Threasa Beards Caller Disagree/Comply Comply Caller Understands Yes PreDisposition Call Doctor Care Advice Given Per Guideline CALL EMS 911 NOW: * Immediate medical attention is needed. You need to hang up and call 911 (or an ambulance). CARE ADVICE given per Neurologic Deficit (Adult) guideline. Referrals [facility] - ED

## 2022-02-18 NOTE — ED Provider Triage Note (Signed)
Emergency Medicine Provider Triage Evaluation Note  [name] , a 81 y.o. male  was evaluated in triage.  Pt complains of left upper extremity pain and weakness for the last few days.  Patient was seen in the ED 2 days ago, had a CT head which showed remote infarcts, was transferred to [facility] for MRI brain without which is negative for acute infarct but patient left before being evaluated in the back.  Still having weakness and numbness, called primary requesting additional workup and rehab, primary recommended patient would need an MRI of the brain with contrast and additional ED evaluation because unable to see him today.  Allwardt, Shane Evens, PA-C  to [name], CMA  [name] A     02/18/22 12:32 PM  I personally called and spoke with patient's daughter, [name]: She reports stuttering, loss of function with arm / hand left side, increased level of pain; patient is requesting to go to a rehab facility because he and family believe he has had another stroke. Informed her that I do not have appointments available today to see him, and I would need appt to order any imaging such as MRI WITH Contrast, but even then it might not be available until the next few days. Strongly advised return to ED at this time.   Review of Systems  Per HPI  Physical Exam  BP (!) 160/93   Pulse 95   Temp 98.6 F (37 C) (Oral)   Resp 18   SpO2 100%  Gen:   Awake, no distress   Resp:  Normal effort  MSK:   Moves extremities without difficulty  Other:    Medical Decision Making  Medically screening exam initiated at 6:16 PM.  Appropriate orders placed.  [name] was informed that the remainder of the evaluation will be completed by another provider, this initial triage assessment does not replace that evaluation, and the importance of remaining in the ED until their evaluation is complete.  Spoke with attending Dr. Darl Householder regarding this patient, advised calling neurology.  I spoke with  Dr. Lorrin Goodell with neurology -he agrees patient did not need an MR brain with contrast to rule out stroke.  Agrees no reason to repeat lab work, CT or MRI brain.  States we can order an MRI cervical spine without to evaluate the left upper extremity weakness which I have ordered.   [name], PA-C 02/18/22 1818

## 2022-02-18 NOTE — ED Triage Notes (Signed)
From home d/t back pain radiating to left arm has been going on for 2 weeks. . Pt was originally called out as a stroke, but pt does nt have any deficits, except for the deficits he got from previous stroke. Was seen at [facility] med center for same. CT was neg. A&O X4.   BP 169/99 HR 81 98 RA  CBG 122 RR 16

## 2022-02-18 NOTE — Telephone Encounter (Signed)
Pt is in a lot of pain, saw Dr Glennon Mac on Monday due to Dr Georgina Snell being out of the office, pt is dropping items and not able to hold on to items with left hand, pt explains loss all muscle in left arm. Pt is requesting appt with PCP ASAP. Dr Glennon Mac didn't feel comfortable prescribing medication until knowing if patient has had a stroke.

## 2022-02-18 NOTE — Telephone Encounter (Signed)
Received patient's assistance app for Eliquis.  Missing items:  - patient's proof of household income - patient's household pharmacy expense.   Left message for wife about what is needed

## 2022-02-18 NOTE — Telephone Encounter (Signed)
Caller states: - She was calling back to find out what they can do   I informed caller of PCP message below. Caller declined to go to ED, but requested an ov for 12/21. States she was offered this earlier today. Per Jeanette's advice, I scheduled pt for 02/19/22 @ 9:30am w/ PCP.   Upon scheduling, I asked caller if she could talk with our triage nurse, caller agreed. Caller transferred to triage.

## 2022-02-19 ENCOUNTER — Ambulatory Visit: Payer: Medicare Other | Admitting: Physician Assistant

## 2022-02-19 ENCOUNTER — Ambulatory Visit (INDEPENDENT_AMBULATORY_CARE_PROVIDER_SITE_OTHER): Payer: Medicare Other | Admitting: Sports Medicine

## 2022-02-19 ENCOUNTER — Telehealth: Payer: Self-pay | Admitting: Physician Assistant

## 2022-02-19 VITALS — Ht 74.0 in | Wt 230.0 lb

## 2022-02-19 DIAGNOSIS — M5030 Other cervical disc degeneration, unspecified cervical region: Secondary | ICD-10-CM

## 2022-02-19 DIAGNOSIS — R29898 Other symptoms and signs involving the musculoskeletal system: Secondary | ICD-10-CM

## 2022-02-19 DIAGNOSIS — M4802 Spinal stenosis, cervical region: Secondary | ICD-10-CM | POA: Diagnosis not present

## 2022-02-19 DIAGNOSIS — M542 Cervicalgia: Secondary | ICD-10-CM

## 2022-02-19 DIAGNOSIS — M25512 Pain in left shoulder: Secondary | ICD-10-CM

## 2022-02-19 MED ORDER — CYCLOBENZAPRINE HCL 5 MG PO TABS: 5.0000 mg | ORAL_TABLET | Freq: Three times a day (TID) | ORAL | 0 refills | Status: AC | PRN

## 2022-02-19 NOTE — Telephone Encounter (Signed)
12/27 at 930am with Alyssa -

## 2022-02-19 NOTE — Telephone Encounter (Signed)
Pt has an appt on the 27th but daughter still wants a call back to speak with someone before that date or sometime today. Please call pt back with details.

## 2022-02-19 NOTE — Telephone Encounter (Signed)
Please contact pt daughter and schedule ED follow up visit per provider

## 2022-02-19 NOTE — Telephone Encounter (Signed)
Please see triage note; pt scheduled for 9:30 am

## 2022-02-19 NOTE — ED Notes (Signed)
Pt wife states they are leaving and having his doctor view his results in [place]

## 2022-02-19 NOTE — Telephone Encounter (Signed)
Daughter would like a call back after the ED notes are read to know what else needs to be done. Weather its an OV or MRI. Please advise and call pt back with details.

## 2022-02-19 NOTE — Patient Instructions (Addendum)
Good to see you  Tylenol 650 mg 3 times a day Flexeril 5 mg nightly as needed for muscle spasms This medicine may make your drowsy so recommend a beside urinal  Stop taking Zanaflex Epidural C6-C7 left  Follow up 2 weeks after injection Dr. Glennon Mac

## 2022-02-19 NOTE — Progress Notes (Signed)
Benito Mccreedy D.[facility] Phone: [phone]   Assessment and Plan:    1. Spinal stenosis in cervical region 2. DDD (degenerative disc disease), cervical 3. Left arm weakness 4. Neck pain 5. Acute pain of left shoulder -Chronic with exacerbation, acutely worsening, complicated, subsequent visit - Reassuring that patient had CT head as well as brain MRI to rule out new stroke during ER evaluations on 02/16/2022.  I agree with [place] ERs assessment and workup.  Patient was further evaluated at [facility], ER on 02/18/2022 and there is a note saying that neurology Dr.Khaliqdina as well as PA [name] do not feel that the previously  performed brain MRI was necessary based on ER note despite 1 week new onset of left upper extremity weakness, slurred speech, gait changes, history of stroke.  I do not understand their reasoning, however maybe I am misinterpreting their note.   -Cervical spine MRI images and results reviewed with patient and his wife at office visit today showing C6-7 severe spinal canal stenosis and moderate bilateral neural foraminal narrowing that could be the underlying cause for patient's symptoms.  MRI additionally showed cord flattening from C4-5 through C6-7, moderate stenosis C4-5 and C5-6 and mild stenosis at C3-C4 with diffusely decreased marrow signal - Patient elects for cervical epidural.  Recommend left-sided epidural at C6-7 - For pain management while awaiting epidural injection, recommend Tylenol 650 mg 3 times a day - Discontinue tizanidine as patient did not feel benefit or effect - May start Flexeril 5 mg nightly as needed for muscle spasms.  Warned patient and his wife that this medication may make patient drowsy and advised to use a bedside urinal to prevent potential falls   Pertinent previous records reviewed include ER note 02/16/2022, ER note 02/18/2022, MRI cervical spine  02/18/2022, MRI brain 02/16/2022, CT head 02/16/2022   Follow Up: 2 weeks after epidural CSI to review benefit.  If no improvement or worsening of symptoms, would consider neurosurgery referral   Subjective:   I, Pincus Badder, am serving as a Education administrator for Doctor Glennon Mac   Chief Complaint: left shoulder pain    HPI:  02/11/2022  HPI: Pt is a 81 y/o male c/o L shoulder pain ongoing since Saturday, 12/9. Pt thinks he may have strained something in his L shoulder when he was getting out of the car. Pt locates pain to the superior aspect of the L GH joint, but all over the joint.   Additionally has some questions about his hammertoes.  He is seeing a podiatrist who discussed some surgical options.  He has used some hammertoe pads recommended by the podiatrist which she found to be helpful.   Radiates: no UE Numbness/tingling: no UE Weakness: no Aggravates: using his arm to pull him up the stairs Treatments tried: Tylenol, heat, cream   Dx imaging: 01/04/18 C-spine CT   02/16/22 Patient states that he is terrible , constant pain that starts at the neck and radiates down to his hand , feels like it is in his muscle , pain in the ball and socket and goes down   02/19/2022 Patient states MRI results     Relevant Historical Information: History of CVA, history of DVT, hypertension, OSA, CKD Additional pertinent review of systems negative.   Current Outpatient Medications:    acetaminophen (TYLENOL) 325 MG tablet, Take 1-2 tablets (325-650 mg total) by mouth every 4 (four) hours as needed for  mild pain., Disp: , Rfl:    allopurinol (ZYLOPRIM) 100 MG tablet, Take 2 tablets by mouth once daily, Disp: 180 tablet, Rfl: 0   amLODipine (NORVASC) 5 MG tablet, Take 1 tablet by mouth once daily, Disp: 90 tablet, Rfl: 0   apixaban (ELIQUIS) 5 MG TABS tablet, Take 1 tablet by mouth twice daily, Disp: 180 tablet, Rfl: 1   cyclobenzaprine (FLEXERIL) 5 MG tablet, Take 1 tablet (5 mg total) by  mouth 3 (three) times daily as needed for muscle spasms., Disp: 30 tablet, Rfl: 0   furosemide (LASIX) 20 MG tablet, Take 1/2 (one-half) tablet by mouth once daily, Disp: 45 tablet, Rfl: 3   Menthol-Methyl Salicylate (MUSCLE RUB) 10-15 % CREA, Apply 1 application topically 4 (four) times daily -  with meals and at bedtime. To neck and shoulders, Disp: , Rfl: 0   metoprolol tartrate (LOPRESSOR) 25 MG tablet, TAKE 1/2 TABLET BY MOUTH TWICE DAILY, Disp: 90 tablet, Rfl: 3   Multiple Vitamin (MULTIVITAMIN) tablet, Take 1 tablet by mouth daily. GNC multi + Omega 3, Disp: , Rfl:    niacin (NIASPAN) 1000 MG CR tablet, , Disp: , Rfl:    niacin (NIASPAN) 500 MG CR tablet, Take 500 mg by mouth at bedtime., Disp: , Rfl:    Olmesartan Medoxomil (BENICAR PO), , Disp: , Rfl:    pantoprazole (PROTONIX) 40 MG tablet, Take 1 tablet by mouth once daily, Disp: 90 tablet, Rfl: 0   tamsulosin (FLOMAX) 0.4 MG CAPS capsule, Take 1 capsule by mouth at bedtime, Disp: 90 capsule, Rfl: 0   Vitamin D, Ergocalciferol, (DRISDOL) 1.25 MG (50000 UNIT) CAPS capsule, Take 1 capsule (50,000 Units total) by mouth every 7 (seven) days., Disp: 12 capsule, Rfl: 0   potassium chloride (KLOR-CON) 10 MEQ tablet, Take 1 tablet (10 mEq total) by mouth daily., Disp: 90 tablet, Rfl: 1   rosuvastatin (CRESTOR) 10 MG tablet, Take 1 tablet (10 mg total) by mouth at bedtime., Disp: 90 tablet, Rfl: 2   Objective:     Vitals:   02/19/22 1501  Weight: 230 lb (104.3 kg)  Height: 6\' 2"  (1.88 m)      Body mass index is 29.53 kg/m.    Physical Exam:    Gen: Appears well, nad, nontoxic and pleasant Neuro:sensation intact, strength is 4 /5 in left upper extremity compared to right with muscle tone generally decreased.  Lipsmacking with mild drooling present throughout examination. Skin: no suspicious lesion or defmority Psych: A&O, appropriate mood and affect.   Left shoulder: no deformity.  Generalized muscle wasting No scapular winging FF  40, abd 40, int 20, ext 60 NTTP over the [place], clavicle, ac, coracoid, biceps groove, humerus, deltoid, trapezius, cervical spine Unable to perform special testing due to limited ROM Neg sulcus sign Negative Spurling's test bilat Decreased sidebending bilaterally, otherwise FROM of neck     Electronically signed by:  Benito Mccreedy D.[facility] 4:04 PM 02/19/22

## 2022-02-19 NOTE — Telephone Encounter (Signed)
Returned daughter Colletta Maryland call and advised appt 12/27, wasn't aware this appt was even scheduled. Advised it was scheduled with patient. Also advised compression of discs on pt neck per Alyssa. Not showing any results indicating stroke from hospital. Advised Dr Glennon Mac has openings today/ tomorrow if she feels they need something done immediately per PCP instructions. Colletta Maryland verbalized understanding.

## 2022-02-20 ENCOUNTER — Ambulatory Visit: Payer: Medicare Other | Admitting: Physical Therapy

## 2022-02-25 ENCOUNTER — Ambulatory Visit (INDEPENDENT_AMBULATORY_CARE_PROVIDER_SITE_OTHER): Payer: Medicare Other | Admitting: Physician Assistant

## 2022-02-25 ENCOUNTER — Encounter: Payer: Self-pay | Admitting: Physician Assistant

## 2022-02-25 VITALS — BP 132/82 | HR 114 | Temp 98.0°F | Ht 74.0 in | Wt 227.6 lb

## 2022-02-25 DIAGNOSIS — R000 Tachycardia, unspecified: Secondary | ICD-10-CM | POA: Diagnosis not present

## 2022-02-25 DIAGNOSIS — R29898 Other symptoms and signs involving the musculoskeletal system: Secondary | ICD-10-CM

## 2022-02-25 DIAGNOSIS — M4802 Spinal stenosis, cervical region: Secondary | ICD-10-CM | POA: Diagnosis not present

## 2022-02-25 NOTE — Progress Notes (Signed)
Subjective:    Patient ID: [name], male    DOB: [birth year], 81 y.o.   MRN: [id number]  Chief Complaint  Patient presents with   Follow-up    Pt has no questions or concerns, pt wife was wondering about some injection     HPI Patient is in today for ED follow-up from 02/16/22 & 02/18/22.  Accompanied by his wife.  There was concern for a new stroke initially on 02/16/2022.  CT of head and MRI head imaging were both negative.  He returned on 02/18/2022 because he continued to complain of severe left arm pain and weakness.  I personally had spoke with the patient's daughter on the phone and she stated that they were still concerned he had a new stroke because he was having stuttering, loss of function, and pain that was different from chronic effects of his for stroke.  I encouraged them to present to the emergency department for a second time.  MRI cervical spine without contrast was performed and severe spinal canal stenosis and neural foraminal narrowing was noted at multiple levels.  Patient has since had follow-up with Dr. Glennon Mac at sports medicine on 02/19/2022 and I have reviewed that note as well.  He is currently waiting on a left-sided epidural at level C6-C7.  He has been taking Tylenol 650 mg 3 times a day and Flexeril 5 mg at bedtime.  TODAY: -Pain 3/10 currently, back of neck, intermittently into left arm. Pain is significantly better than initial ED visits per patient -Sleeping well -Enjoyed Christmas holiday with family, was able to eat and socialize -Wife noticing him holding his left hand more often than normal still   Past Medical History:  Diagnosis Date   CAD (coronary artery disease)    s/p cath in 2005 and CABG x4   Cataract    DJD (degenerative joint disease)    DJD (degenerative joint disease)    Fluid retention    mild   H/O: gout    History of blood clots    HTN (hypertension)    Hyperlipidemia    Hyperlipidemia    Myocardial infarction ([place]) 2009    Obesity    with reduction   OSA (obstructive sleep apnea)    AHI 98/hr   Renal insufficiency    Sleep apnea    Stroke ([place]) 2019    Past Surgical History:  Procedure Laterality Date   CORONARY ARTERY BYPASS GRAFT  2005   LIMA to LAD, SVG to OM1 and OM 2, RCA.    left inguinal hernia repair     LOOP RECORDER INSERTION N/A 01/03/2018   Procedure: LOOP RECORDER INSERTION;  Surgeon: Evans Lance, MD;  Location: [place] [facility];  Service: Cardiovascular;  Laterality: N/A;   TEE WITHOUT CARDIOVERSION N/A 12/11/2016   Procedure: TRANSESOPHAGEAL ECHOCARDIOGRAM (TEE);  Surgeon: [name], MD;  Location: [facility] ENDOSCOPY;  Service: Cardiovascular;  Laterality: N/A;   TEE WITHOUT CARDIOVERSION N/A 01/03/2018   Procedure: TRANSESOPHAGEAL ECHOCARDIOGRAM (TEE);  Surgeon: Dorothy Spark, MD;  Location: [facility] ENDOSCOPY;  Service: Cardiovascular;  Laterality: N/A;    Family History  Problem Relation Age of Onset   Gout Mother    Stroke Mother    Coronary artery disease Mother    Hypertension Mother    Arthritis Mother    Coronary artery disease Father    Arthritis Father    Gout Father    Stroke Father    Hypertension Father    Coronary  artery disease Brother    Arthritis Brother    Gout Brother    Stroke Brother    Hypertension Brother    Coronary artery disease Brother    Arthritis Brother    Gout Brother    Stroke Brother    Hypertension Brother    Coronary artery disease Other        significant in multiple family members    Social History   Tobacco Use   Smoking status: Former    Types: Cigars   Smokeless tobacco: Never   Tobacco comments:    used to smoke cigars, 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, none since  Vaping Use   Vaping Use: Never used  Substance Use Topics   Alcohol use: No   Drug use: No     Allergies  Allergen Reactions   Ramipril Cough    Review of Systems NEGATIVE UNLESS OTHERWISE INDICATED IN HPI      Objective:     BP 132/82 (BP  Location: Right Arm, Patient Position: Sitting)   Pulse (!) 114   Temp 98 F (36.7 C) (Temporal)   Ht 6\' 2"  (1.88 m)   Wt 227 lb 9.6 oz (103.2 kg)   SpO2 96%   BMI 29.22 kg/m   Wt Readings from Last 3 Encounters:  02/25/22 227 lb 9.6 oz (103.2 kg)  02/19/22 230 lb (104.3 kg)  02/16/22 230 lb (104.3 kg)    BP Readings from Last 3 Encounters:  02/25/22 132/82  02/19/22 (!) 168/96  02/16/22 (!) 167/99     Physical Exam Vitals and nursing note reviewed.  Constitutional:      Appearance: Normal appearance.  Eyes:     Extraocular Movements: Extraocular movements intact.     Conjunctiva/sclera: Conjunctivae normal.     Pupils: Pupils are equal, round, and reactive to light.  Cardiovascular:     Rate and Rhythm: Tachycardia present.     Heart sounds: No murmur heard. Pulmonary:     Effort: Pulmonary effort is normal.     Breath sounds: Normal breath sounds.  Skin:    Findings: No rash.  Neurological:     General: No focal deficit present.     Mental Status: He is alert and oriented to person, place, and time.     Sensory: No sensory deficit.     Motor: Weakness (4/5 strength LUE compared to RUE) present.     Gait: Gait abnormal (shuffling intermittently, able to stabilize with reminders).  Psychiatric:        Mood and Affect: Mood normal.        Behavior: Behavior normal.        Assessment & Plan:  Left arm weakness  Spinal stenosis, cervical region  Sinus tachycardia -     EKG 12-Lead   I personally reviewed patient's ED reports, most recent visit with sports medicine, labs, imaging.  Thankfully his pain level is improving in his left arm.  He has not yet been contacted to schedule for CSI left cervical region; we were able to provide contact information for [facility] imaging today.  Him and his wife will call to schedule this as they leave here today.  I also suggested physical therapy and they said they are working on getting this set up as well.  Heart rate  was elevated on exam.  He did not have any complaints of chest pain or palpitations.  EKG performed and showed sinus tachycardia, otherwise no acute changes.  He will  follow-up with Dr. Debara Pickett in April.  Happy to see him back sooner if any concerns come up.  Encouraged him to be mindful of home exercises to keep his strength up.    No follow-ups on file.  This note was prepared with assistance of Systems analyst. Occasional wrong-word or sound-a-like substitutions may have occurred due to the inherent limitations of voice recognition software.     Dyllan Hughett M Jazyiah Yiu, PA-C

## 2022-03-02 ENCOUNTER — Other Ambulatory Visit: Payer: Self-pay | Admitting: Physician Assistant

## 2022-03-03 ENCOUNTER — Inpatient Hospital Stay: Admission: RE | Admit: 2022-03-03 | Payer: Medicare Other | Source: Ambulatory Visit

## 2022-03-05 ENCOUNTER — Other Ambulatory Visit: Payer: Self-pay | Admitting: Physician Assistant

## 2022-03-13 ENCOUNTER — Ambulatory Visit: Payer: Medicare Other | Admitting: Physician Assistant

## 2022-03-25 ENCOUNTER — Ambulatory Visit: Payer: Medicare Other | Admitting: Family Medicine

## 2022-04-09 ENCOUNTER — Ambulatory Visit: Payer: Medicare Other | Admitting: Internal Medicine

## 2022-04-20 DIAGNOSIS — R350 Frequency of micturition: Secondary | ICD-10-CM | POA: Diagnosis not present

## 2022-04-20 DIAGNOSIS — R3915 Urgency of urination: Secondary | ICD-10-CM | POA: Diagnosis not present

## 2022-05-01 ENCOUNTER — Encounter: Payer: Self-pay | Admitting: Podiatry

## 2022-05-01 ENCOUNTER — Ambulatory Visit: Payer: Medicare Other | Admitting: Podiatry

## 2022-05-01 DIAGNOSIS — B351 Tinea unguium: Secondary | ICD-10-CM | POA: Diagnosis not present

## 2022-05-01 DIAGNOSIS — M79675 Pain in left toe(s): Secondary | ICD-10-CM

## 2022-05-01 DIAGNOSIS — L84 Corns and callosities: Secondary | ICD-10-CM | POA: Diagnosis not present

## 2022-05-01 DIAGNOSIS — M79674 Pain in right toe(s): Secondary | ICD-10-CM | POA: Diagnosis not present

## 2022-05-01 NOTE — Progress Notes (Signed)
This patient returns to my office for at risk foot care.  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  Patient is taking eliquis.  This patient is unable to cut nails himself since the patient cannot reach his nails.These nails are painful walking and wearing shoes.  This patient presents for at risk foot care today.  General Appearance  Alert, conversant and in no acute stress.  Vascular  Dorsalis pedis  are weakly palpable bilaterally.  Posterior tibial pulses are absent due to swelling. Posterior tibial pulses are not palpable.Capillary return is within normal limits  bilaterally. Temperature is within normal limits  bilaterally.  Neurologic  Senn-Weinstein monofilament wire test within normal limits  bilaterally. Muscle power within normal limits bilaterally.  Nails Thick disfigured discolored nails with subungual debris  from hallux to fifth toes bilaterally. No evidence of bacterial infection or drainage bilaterally.  Orthopedic  No limitations of motion  feet .  No crepitus or effusions noted.  No bony pathology or digital deformities noted.  HAV  B/L.  Skin  normotropic skin with no porokeratosis noted bilaterally.  No signs of infections or ulcers noted.   Clavi 3rd toe right foot.  Onychomycosis  Pain in right toes  Pain in left toes  Clavi third toe right foot.  Consent was obtained for treatment procedures.   Mechanical debridement of nails 1-5  bilaterally performed with a nail nipper.  Filed with dremel without incident.  Debride callus with dremel.   Return office visit  10 weeks                Told patient to return for periodic foot care and evaluation due to potential at risk complications.   Gardiner Barefoot DPM

## 2022-05-27 ENCOUNTER — Telehealth: Payer: Self-pay | Admitting: Physician Assistant

## 2022-05-27 NOTE — Telephone Encounter (Signed)
Copied from [place] [phone]. Topic: Medicare AWV >> May 27, 2022 12:35 PM Gillis Santa wrote: Reason for CRM: Called patient to schedule Medicare Annual Wellness Visit (AWV). No voicemail available to leave a message.  Last date of AWV: N/A  Please schedule an appointment at any time with Otila Kluver, [facility]. Please schedule AWVS with Otila Kluver, [place].  If any questions, please contact me at [phone].  Thank you ,  Shaune Pollack [facility] AWV TEAM Direct Dial [phone]

## 2022-06-11 DIAGNOSIS — R8271 Bacteriuria: Secondary | ICD-10-CM | POA: Diagnosis not present

## 2022-06-11 DIAGNOSIS — N3000 Acute cystitis without hematuria: Secondary | ICD-10-CM | POA: Diagnosis not present

## 2022-06-11 DIAGNOSIS — R3915 Urgency of urination: Secondary | ICD-10-CM | POA: Diagnosis not present

## 2022-06-18 ENCOUNTER — Other Ambulatory Visit: Payer: Self-pay | Admitting: Physician Assistant

## 2022-06-18 ENCOUNTER — Ambulatory Visit: Payer: Medicare Other | Attending: Internal Medicine | Admitting: Internal Medicine

## 2022-06-18 ENCOUNTER — Encounter: Payer: Self-pay | Admitting: Internal Medicine

## 2022-06-18 VITALS — BP 122/68 | HR 58 | Ht 74.0 in | Wt 227.0 lb

## 2022-06-18 DIAGNOSIS — I25709 Atherosclerosis of coronary artery bypass graft(s), unspecified, with unspecified angina pectoris: Secondary | ICD-10-CM | POA: Diagnosis not present

## 2022-06-18 DIAGNOSIS — I10 Essential (primary) hypertension: Secondary | ICD-10-CM | POA: Diagnosis not present

## 2022-06-18 DIAGNOSIS — Z951 Presence of aortocoronary bypass graft: Secondary | ICD-10-CM | POA: Diagnosis not present

## 2022-06-18 DIAGNOSIS — E7800 Pure hypercholesterolemia, unspecified: Secondary | ICD-10-CM | POA: Diagnosis not present

## 2022-06-18 DIAGNOSIS — Z8673 Personal history of transient ischemic attack (TIA), and cerebral infarction without residual deficits: Secondary | ICD-10-CM

## 2022-06-18 NOTE — Progress Notes (Signed)
OFFICE NOTE  Chief Complaint:  Follow-up  Primary Care Physician: Allwardt, Crist Infante, PA-C  HPI:  [name] is a 82 y.o. male with a past medial history significant for coronary artery disease status post CABG in 2005 (LIMA to LAD, SVG to first OM and SVG to second OM and SVG to RCA (, hypertension, dyslipidemia and gout.  He was last seen in July by Lorin Picket, DNP, which time he had a stress test which was negative for ischemia.  I had met him in October 2018 at the time when he was hospitalized for bacteremia and had a TEE then which was negative for vegetation.  Recently he unfortunately had a stroke in November and had a repeat TEE which was unremarkable.  Subsequently he had an implanted loop recorder placed by Dr. Ladona Ridgel.  This did demonstrate 2-1 atrial flutter with 10 episodes in January and he ultimately has been placed on Eliquis.  He seems to be tolerating this.  He was unaware of any A. fib.  He continues in neuro rehab.  11/01/2018  [name] is seen today in follow-up.  Overall he continues to do well.  He denies any chest pain or worsening shortness of breath.  His blood pressure is well controlled today.  Heart rate is low normal.  His only complaint really is some itchiness of his sternal scars which is likely from keloids.  EKG showed sinus bradycardia with some PVCs.  He is asymptomatic denying any palpitations or awareness of his heart rate.  He denies any recurrent atrial fibrillation.  He remains on Eliquis for anticoagulation.  No bleeding issues.  Cholesterol is followed by his primary care provider Dr. August Saucer.  11/03/2018  [name] returns today for annual follow-up.  Overall he is feeling well.  He denies any recurrent stroke or TIA.  He continues to get checks of his loop recorder which still has battery life.  Blood pressure is excellent today 122/74.  He is due for repeat lipids.  He has felt somewhat fatigued and notably his heart rate was in the 40s today  with a sinus bradycardia and first-degree AV block.  05/02/2021  [name] is seen today in follow-up.  He is accompanied by his wife.  He reports some fatigue but overall no complaints.  She notes that he tends to sit around a lot and often times when he is contemplating what to make for dinner she says he would pull a chair up to the refrigerator and sit there to look in there.  He does not think this is an issue.  He denies any chest pain or shortness of breath with exertion.  Blood pressure is well controlled today.  Heart rate remains in the mid 50s.  He continues to have remote checks of his implanted loop recorder which was placed in 2019.  That does not demonstrate any recurrent atrial flutter.  He is anticoagulated for this.  06/18/2022  [name] returns today for follow-up.  Overall he seems to be doing well.  Back in December he had an episode of some pain in his neck and arm.  It was felt that this could be related to his stroke and he did have an MRI which showed no acute changes.  Ultimately that resolved.  He has had no further chest pain.  Blood pressure is well-controlled.  His implanted loop recorder is run out of battery life.  His wife had expressed a concern about possibly having  it explanted however the patient is not bothered by it.  EKG today shows a sinus bradycardia with first-degree AV block.  PMHx:  Past Medical History:  Diagnosis Date   CAD (coronary artery disease)    s/p cath in 2005 and CABG x4   Cataract    DJD (degenerative joint disease)    DJD (degenerative joint disease)    Fluid retention    mild   H/O: gout    History of blood clots    HTN (hypertension)    Hyperlipidemia    Hyperlipidemia    Myocardial infarction 2009   Obesity    with reduction   OSA (obstructive sleep apnea)    AHI 98/hr   Renal insufficiency    Sleep apnea    Stroke 2019    Past Surgical History:  Procedure Laterality Date   CORONARY ARTERY BYPASS GRAFT  2005   LIMA to  LAD, SVG to OM1 and OM 2, RCA.    left inguinal hernia repair     LOOP RECORDER INSERTION N/A 01/03/2018   Procedure: LOOP RECORDER INSERTION;  Surgeon: Marinus Maw, MD;  Location: [facility];  Service: Cardiovascular;  Laterality: N/A;   TEE WITHOUT CARDIOVERSION N/A 12/11/2016   Procedure: TRANSESOPHAGEAL ECHOCARDIOGRAM (TEE);  Surgeon: Chrystie Nose, MD;  Location: [facility] ENDOSCOPY;  Service: Cardiovascular;  Laterality: N/A;   TEE WITHOUT CARDIOVERSION N/A 01/03/2018   Procedure: TRANSESOPHAGEAL ECHOCARDIOGRAM (TEE);  Surgeon: Lars Masson, MD;  Location: [facility] ENDOSCOPY;  Service: Cardiovascular;  Laterality: N/A;    FAMHx:  Family History  Problem Relation Age of Onset   Gout Mother    Stroke Mother    Coronary artery disease Mother    Hypertension Mother    Arthritis Mother    Coronary artery disease Father    Arthritis Father    Gout Father    Stroke Father    Hypertension Father    Coronary artery disease Brother    Arthritis Brother    Gout Brother    Stroke Brother    Hypertension Brother    Coronary artery disease Brother    Arthritis Brother    Gout Brother    Stroke Brother    Hypertension Brother    Coronary artery disease Other        significant in multiple family members    SOCHx:   reports that he has quit smoking. His smoking use included cigars. He has never used smokeless tobacco. He reports that he does not drink alcohol and does not use drugs.  ALLERGIES:  Allergies  Allergen Reactions   Ramipril Cough    ROS: Pertinent items noted in HPI and remainder of comprehensive ROS otherwise negative.  HOME MEDS: Current Outpatient Medications on File Prior to Visit  Medication Sig Dispense Refill   acetaminophen (TYLENOL) 325 MG tablet Take 1-2 tablets (325-650 mg total) by mouth every 4 (four) hours as needed for mild pain.     allopurinol (ZYLOPRIM) 100 MG tablet Take 2 tablets by mouth once daily 180 tablet 0   amLODipine (NORVASC)  5 MG tablet Take 1 tablet by mouth once daily 90 tablet 0   apixaban (ELIQUIS) 5 MG TABS tablet Take 1 tablet by mouth twice daily 180 tablet 1   cyclobenzaprine (FLEXERIL) 5 MG tablet Take 1 tablet (5 mg total) by mouth 3 (three) times daily as needed for muscle spasms. 30 tablet 0   furosemide (LASIX) 20 MG tablet Take 1/2 (one-half) tablet by  mouth once daily 45 tablet 3   Menthol-Methyl Salicylate (MUSCLE RUB) 10-15 % CREA Apply 1 application topically 4 (four) times daily -  with meals and at bedtime. To neck and shoulders  0   metoprolol tartrate (LOPRESSOR) 25 MG tablet TAKE 1/2 TABLET BY MOUTH TWICE DAILY 90 tablet 3   Multiple Vitamin (MULTIVITAMIN) tablet Take 1 tablet by mouth daily. [employer] multi + Omega 3     niacin (NIASPAN) 1000 MG CR tablet      niacin (NIASPAN) 500 MG CR tablet Take 500 mg by mouth at bedtime.     Olmesartan Medoxomil (BENICAR PO)      pantoprazole (PROTONIX) 40 MG tablet Take 1 tablet by mouth once daily 90 tablet 0   tamsulosin (FLOMAX) 0.4 MG CAPS capsule Take 1 capsule by mouth at bedtime 90 capsule 0   Vitamin D, Ergocalciferol, (DRISDOL) 1.25 MG (50000 UNIT) CAPS capsule Take 1 capsule (50,000 Units total) by mouth every 7 (seven) days. 12 capsule 0   potassium chloride (KLOR-CON) 10 MEQ tablet Take 1 tablet (10 mEq total) by mouth daily. 90 tablet 1   rosuvastatin (CRESTOR) 10 MG tablet Take 1 tablet (10 mg total) by mouth at bedtime. 90 tablet 2   No current facility-administered medications on file prior to visit.    LABS/IMAGING: No results found for this or any previous visit (from the past 48 hour(s)). No results found.  LIPID PANEL:    Component Value Date/Time   CHOL 100 10/18/2020 1444   CHOL 186 09/07/2017 1617   TRIG 61.0 10/18/2020 1444   HDL 43.50 10/18/2020 1444   HDL 39 (L) 09/07/2017 1617   CHOLHDL 2 10/18/2020 1444   VLDL 12.2 10/18/2020 1444   LDLCALC 44 10/18/2020 1444   LDLCALC 126 (H) 09/07/2017 1617     WEIGHTS: Wt  Readings from Last 3 Encounters:  06/18/22 227 lb (103 kg)  02/25/22 227 lb 9.6 oz (103.2 kg)  02/19/22 230 lb (104.3 kg)    VITALS: BP 122/68 (BP Location: Left Arm, Patient Position: Sitting, Cuff Size: Normal)   Pulse (!) 58   Ht 6\' 2"  (1.88 m)   Wt 227 lb (103 kg)   BMI 29.15 kg/m   EXAM: General appearance: alert and no distress Neck: no carotid bruit, no JVD and thyroid not enlarged, symmetric, no tenderness/mass/nodules Lungs: clear to auscultation bilaterally Heart: regular rate and rhythm Abdomen: soft, non-tender; bowel sounds normal; no masses,  no organomegaly Extremities: extremities normal, atraumatic, no cyanosis or edema Pulses: 2+ and symmetric Skin: Skin color, texture, turgor normal. No rashes or lesions Neurologic: Mental status: Alert, oriented, thought content appropriate, 4 out of 5 left arm strength Psych: Pleasant  EKG: Sinus bradycardia first-degree AV block at 58--personally reviewed  ASSESSMENT: CAD status post CABG x4 in 2005 (LIMA to LAD, SVG to OM1 and OM 2, SVG to RCA), low risk Myoview stress test with normal LV function (08/2017) - LVEF 52% Hypertension Dyslipidemia Stroke (12/2017) - s/p ILR Paroxysmal atrial flutter-anticoagulated on Eliquis - CHADVASC score of 5  PLAN: 1.   [name] seems to be doing well without any cardiac complaints.  Blood pressure is well-controlled.  His cholesterols been at goal.  His loop recorder is now out of battery life.  No evidence of any A-fib today.  Will plan to continue her current therapies.  Follow-up with me annually or sooner as necessary.  Chrystie Nose, MD, [facility], FACP  [place]  [facility]  Medical  Director of the Advanced Lipid Disorders &  Cardiovascular Risk Reduction Clinic Diplomate of the American Board of Clinical Lipidology Attending Cardiologist  Direct Dial: [phone]  Fax: [phone]  Website:  [email or web address] Nicely Vic Esco 06/18/2022, 9:25 AM

## 2022-06-18 NOTE — Patient Instructions (Addendum)
Medication Instructions:  No changes   Contact the office of what  your are actually taking vitamin wise *If you need a refill on your cardiac medications before your next appointment, please call your pharmacy*   Lab Work: Not needed    Testing/Procedures:  Not needed  Follow-Up: At [facility], you and your health needs are our priority.  As part of our continuing mission to provide you with exceptional heart care, we have created designated Provider Care Teams.  These Care Teams include your primary Cardiologist (physician) and Advanced Practice Providers (APPs -  Physician Assistants and Nurse Practitioners) who all work together to provide you with the care you need, when you need it.     Your next appointment:   12 month(s)  The format for your next appointment:   In Person  Provider:   Chrystie Nose, MD

## 2022-07-10 ENCOUNTER — Encounter: Payer: Self-pay | Admitting: Podiatry

## 2022-07-10 ENCOUNTER — Ambulatory Visit (INDEPENDENT_AMBULATORY_CARE_PROVIDER_SITE_OTHER): Payer: Medicare Other | Admitting: Podiatry

## 2022-07-10 DIAGNOSIS — M79675 Pain in left toe(s): Secondary | ICD-10-CM | POA: Diagnosis not present

## 2022-07-10 DIAGNOSIS — N182 Chronic kidney disease, stage 2 (mild): Secondary | ICD-10-CM

## 2022-07-10 DIAGNOSIS — B351 Tinea unguium: Secondary | ICD-10-CM

## 2022-07-10 DIAGNOSIS — M79674 Pain in right toe(s): Secondary | ICD-10-CM | POA: Diagnosis not present

## 2022-07-10 DIAGNOSIS — L84 Corns and callosities: Secondary | ICD-10-CM

## 2022-07-10 NOTE — Progress Notes (Signed)
This patient returns to my office for at risk foot care.  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  Patient is taking eliquis.  This patient is unable to cut nails himself since the patient cannot reach his nails.These nails are painful walking and wearing shoes.  This patient presents for at risk foot care today.  General Appearance  Alert, conversant and in no acute stress.  Vascular  Dorsalis pedis  are weakly palpable bilaterally.  Posterior tibial pulses are absent due to swelling. Posterior tibial pulses are not palpable.Capillary return is within normal limits  bilaterally. Temperature is within normal limits  bilaterally.  Neurologic  Senn-Weinstein monofilament wire test within normal limits  bilaterally. Muscle power within normal limits bilaterally.  Nails Thick disfigured discolored nails with subungual debris  from hallux to fifth toes bilaterally. No evidence of bacterial infection or drainage bilaterally.  Orthopedic  No limitations of motion  feet .  No crepitus or effusions noted.  No bony pathology or digital deformities noted.  HAV  B/L.  Skin  normotropic skin with no porokeratosis noted bilaterally.  No signs of infections or ulcers noted.   Clavi 3rd toe right foot.  Onychomycosis  Pain in right toes  Pain in left toes  Clavi third toe right foot.  Consent was obtained for treatment procedures.   Mechanical debridement of nails 1-5  bilaterally performed with a nail nipper.  Filed with dremel without incident.  Debride callus with dremel. Padding dispensed.   Return office visit  10 weeks                Told patient to return for periodic foot care and evaluation due to potential at risk complications.   Helane Gunther DPM

## 2022-07-13 ENCOUNTER — Other Ambulatory Visit: Payer: Self-pay | Admitting: Physician Assistant

## 2022-07-14 ENCOUNTER — Encounter: Payer: Self-pay | Admitting: Emergency Medicine

## 2022-07-14 ENCOUNTER — Ambulatory Visit (INDEPENDENT_AMBULATORY_CARE_PROVIDER_SITE_OTHER): Payer: Medicare Other | Admitting: Emergency Medicine

## 2022-07-14 VITALS — BP 126/62 | HR 56 | Temp 98.6°F | Ht 74.0 in | Wt 232.0 lb

## 2022-07-14 DIAGNOSIS — Z7689 Persons encountering health services in other specified circumstances: Secondary | ICD-10-CM

## 2022-07-14 DIAGNOSIS — I10 Essential (primary) hypertension: Secondary | ICD-10-CM | POA: Diagnosis not present

## 2022-07-14 DIAGNOSIS — I25709 Atherosclerosis of coronary artery bypass graft(s), unspecified, with unspecified angina pectoris: Secondary | ICD-10-CM | POA: Diagnosis not present

## 2022-07-14 DIAGNOSIS — E785 Hyperlipidemia, unspecified: Secondary | ICD-10-CM

## 2022-07-14 DIAGNOSIS — N1831 Chronic kidney disease, stage 3a: Secondary | ICD-10-CM

## 2022-07-14 DIAGNOSIS — M2041 Other hammer toe(s) (acquired), right foot: Secondary | ICD-10-CM | POA: Diagnosis not present

## 2022-07-14 DIAGNOSIS — G4733 Obstructive sleep apnea (adult) (pediatric): Secondary | ICD-10-CM | POA: Diagnosis not present

## 2022-07-14 DIAGNOSIS — Z8673 Personal history of transient ischemic attack (TIA), and cerebral infarction without residual deficits: Secondary | ICD-10-CM

## 2022-07-14 DIAGNOSIS — D638 Anemia in other chronic diseases classified elsewhere: Secondary | ICD-10-CM | POA: Diagnosis not present

## 2022-07-14 DIAGNOSIS — D689 Coagulation defect, unspecified: Secondary | ICD-10-CM | POA: Diagnosis not present

## 2022-07-14 DIAGNOSIS — M2042 Other hammer toe(s) (acquired), left foot: Secondary | ICD-10-CM | POA: Diagnosis not present

## 2022-07-14 LAB — COMPREHENSIVE METABOLIC PANEL
ALT: 8 U/L (ref 0–53)
AST: 16 U/L (ref 0–37)
Albumin: 3.7 g/dL (ref 3.5–5.2)
Alkaline Phosphatase: 59 U/L (ref 39–117)
BUN: 17 mg/dL (ref 6–23)
CO2: 30 mEq/L (ref 19–32)
Calcium: 9.8 mg/dL (ref 8.4–10.5)
Chloride: 105 mEq/L (ref 96–112)
Creatinine, Ser: 1.29 mg/dL (ref 0.40–1.50)
GFR: 52.52 mL/min — ABNORMAL LOW (ref >60.00)
Glucose, Bld: 78 mg/dL (ref 70–99)
Potassium: 4.3 mEq/L (ref 3.5–5.1)
Sodium: 140 mEq/L (ref 135–145)
Total Bilirubin: 0.4 mg/dL (ref 0.2–1.2)
Total Protein: 7.2 g/dL (ref 6.0–8.3)

## 2022-07-14 LAB — HEMOGLOBIN A1C: Hgb A1c MFr Bld: 5.7 % (ref 4.6–6.5)

## 2022-07-14 LAB — LIPID PANEL
Cholesterol: 175 mg/dL (ref 0–200)
HDL: 49.2 mg/dL (ref >39.00)
LDL Cholesterol: 110 mg/dL — ABNORMAL HIGH (ref 0–99)
NonHDL: 125.91
Total CHOL/HDL Ratio: 4
Triglycerides: 82 mg/dL (ref 0.0–149.0)
VLDL: 16.4 mg/dL (ref 0.0–40.0)

## 2022-07-14 LAB — CBC WITH DIFFERENTIAL/PLATELET
Basophils Absolute: 0 10*3/uL (ref 0.0–0.1)
Basophils Relative: 0.8 % (ref 0.0–3.0)
Eosinophils Absolute: 0.3 10*3/uL (ref 0.0–0.7)
Eosinophils Relative: 4.7 % (ref 0.0–5.0)
HCT: 38.1 % — ABNORMAL LOW (ref 39.0–52.0)
Hemoglobin: 12.4 g/dL — ABNORMAL LOW (ref 13.0–17.0)
Lymphocytes Relative: 31.5 % (ref 12.0–46.0)
Lymphs Abs: 1.8 10*3/uL (ref 0.7–4.0)
MCHC: 32.5 g/dL (ref 30.0–36.0)
MCV: 89.4 fl (ref 78.0–100.0)
Monocytes Absolute: 0.6 10*3/uL (ref 0.1–1.0)
Monocytes Relative: 9.9 % (ref 3.0–12.0)
Neutro Abs: 3 10*3/uL (ref 1.4–7.7)
Neutrophils Relative %: 53.1 % (ref 43.0–77.0)
Platelets: 247 10*3/uL (ref 150.0–400.0)
RBC: 4.26 Mil/uL (ref 4.22–5.81)
RDW: 16.2 % — ABNORMAL HIGH (ref 11.5–15.5)
WBC: 5.6 10*3/uL (ref 4.0–10.5)

## 2022-07-14 MED ORDER — ROSUVASTATIN CALCIUM 10 MG PO TABS
10.0000 mg | ORAL_TABLET | Freq: Every day | ORAL | 3 refills | Status: DC
Start: 2022-07-14 — End: 2023-01-14

## 2022-07-14 NOTE — Assessment & Plan Note (Signed)
Stable.  Advised to stay well-hydrated and avoid NSAIDs. 

## 2022-07-14 NOTE — Assessment & Plan Note (Signed)
Stable.  No recent anginal episodes. Continue metoprolol tartrate 12.5 mg twice a day Sees cardiologist on a regular basis

## 2022-07-14 NOTE — Assessment & Plan Note (Signed)
Stable.  CBC done today. 

## 2022-07-14 NOTE — Assessment & Plan Note (Signed)
Stable.  Secondary stroke prevention discussed Well-controlled hypertension On cholesterol medication Continues Eliquis 5 mg twice a day Diet and nutrition discussed

## 2022-07-14 NOTE — Patient Instructions (Signed)
Health Maintenance After Age 80 After age 80, you are at a higher risk for certain long-term diseases and infections as well as injuries from falls. Falls are a major cause of broken bones and head injuries in people who are older than age 80. Getting regular preventive care can help to keep you healthy and well. Preventive care includes getting regular testing and making lifestyle changes as recommended by your health care provider. Talk with your health care provider about: Which screenings and tests you should have. A screening is a test that checks for a disease when you have no symptoms. A diet and exercise plan that is right for you. What should I know about screenings and tests to prevent falls? Screening and testing are the best ways to find a health problem early. Early diagnosis and treatment give you the best chance of managing medical conditions that are common after age 80. Certain conditions and lifestyle choices may make you more likely to have a fall. Your health care provider may recommend: Regular vision checks. Poor vision and conditions such as cataracts can make you more likely to have a fall. If you wear glasses, make sure to get your prescription updated if your vision changes. Medicine review. Work with your health care provider to regularly review all of the medicines you are taking, including over-the-counter medicines. Ask your health care provider about any side effects that may make you more likely to have a fall. Tell your health care provider if any medicines that you take make you feel dizzy or sleepy. Strength and balance checks. Your health care provider may recommend certain tests to check your strength and balance while standing, walking, or changing positions. Foot health exam. Foot pain and numbness, as well as not wearing proper footwear, can make you more likely to have a fall. Screenings, including: Osteoporosis screening. Osteoporosis is a condition that causes  the bones to get weaker and break more easily. Blood pressure screening. Blood pressure changes and medicines to control blood pressure can make you feel dizzy. Depression screening. You may be more likely to have a fall if you have a fear of falling, feel depressed, or feel unable to do activities that you used to do. Alcohol use screening. Using too much alcohol can affect your balance and may make you more likely to have a fall. Follow these instructions at home: Lifestyle Do not drink alcohol if: Your health care provider tells you not to drink. If you drink alcohol: Limit how much you have to: 0-1 drink a day for women. 0-2 drinks a day for men. Know how much alcohol is in your drink. In the [place] one drink equals one 12 oz bottle of beer (355 mL), one 5 oz glass of wine (148 mL), or one 1 oz glass of hard liquor (44 mL). Do not use any products that contain nicotine or tobacco. These products include cigarettes, chewing tobacco, and vaping devices, such as e-cigarettes. If you need help quitting, ask your health care provider. Activity  Follow a regular exercise program to stay fit. This will help you maintain your balance. Ask your health care provider what types of exercise are appropriate for you. If you need a cane or walker, use it as recommended by your health care provider. Wear supportive shoes that have nonskid soles. Safety  Remove any tripping hazards, such as rugs, cords, and clutter. Install safety equipment such as grab bars in bathrooms and safety rails on stairs. Keep rooms and walkways   well-lit. General instructions Talk with your health care provider about your risks for falling. Tell your health care provider if: You fall. Be sure to tell your health care provider about all falls, even ones that seem minor. You feel dizzy, tiredness (fatigue), or off-balance. Take over-the-counter and prescription medicines only as told by your health care provider. These include  supplements. Eat a healthy diet and maintain a healthy weight. A healthy diet includes low-fat dairy products, low-fat (lean) meats, and fiber from whole grains, beans, and lots of fruits and vegetables. Stay current with your vaccines. Schedule regular health, dental, and eye exams. Summary Having a healthy lifestyle and getting preventive care can help to protect your health and wellness after age 80. Screening and testing are the best way to find a health problem early and help you avoid having a fall. Early diagnosis and treatment give you the best chance for managing medical conditions that are more common for people who are older than age 80. Falls are a major cause of broken bones and head injuries in people who are older than age 80. Take precautions to prevent a fall at home. Work with your health care provider to learn what changes you can make to improve your health and wellness and to prevent falls. This information is not intended to replace advice given to you by your health care provider. Make sure you discuss any questions you have with your health care provider. Document Revised: 07/08/2020 Document Reviewed: 07/08/2020 Elsevier Patient Education  2023 Elsevier Inc.  

## 2022-07-14 NOTE — Assessment & Plan Note (Signed)
BP Readings from Last 3 Encounters:  07/14/22 126/62  06/18/22 122/68  02/25/22 132/82  Well-controlled hypertension. Continue amlodipine 5 mg daily and Benicar 40 mg daily

## 2022-07-14 NOTE — Assessment & Plan Note (Signed)
Noncompliant with CPAP treatment

## 2022-07-14 NOTE — Progress Notes (Signed)
[name] 82 y.o.   Chief Complaint  Patient presents with   New Patient (Initial Visit)    Patient wants to be referred to podiatry for a hammer toe right foot.    hx stroke    Patient had a stroke years past and he has not energy, fatigued    HISTORY OF PRESENT ILLNESS: This is a 82 y.o. male first visit to this office, here to establish care with me. Accompanied by his wife. Has the following chronic medical conditions: 1.  History of stroke with left-sided residual weakness 2.  Hypertension 3.  Coronary artery disease 4.  OSA noncompliant with CPAP treatment 5.  Dyslipidemia 6.  History of chronic anemia 7.  Chronic kidney disease 8.  On long-term chronic anticoagulation  HPI   Prior to Admission medications   Medication Sig Start Date End Date Taking? Authorizing Provider  acetaminophen (TYLENOL) 325 MG tablet Take 1-2 tablets (325-650 mg total) by mouth every 4 (four) hours as needed for mild pain. 01/21/18  Yes Love, Evlyn Kanner, PA-C  allopurinol (ZYLOPRIM) 100 MG tablet Take 2 tablets by mouth once daily 02/17/22  Yes Allwardt, Alyssa M, PA-C  amLODipine (NORVASC) 5 MG tablet Take 1 tablet by mouth once daily 06/18/22  Yes Allwardt, Alyssa M, PA-C  apixaban (ELIQUIS) 5 MG TABS tablet Take 1 tablet by mouth twice daily 02/02/22  Yes Hilty, Lisette Abu, MD  finasteride (PROSCAR) 5 MG tablet Take 5 mg by mouth daily. 05/29/22  Yes [provider]  furosemide (LASIX) 20 MG tablet Take 1/2 (one-half) tablet by mouth once daily 09/15/21  Yes Marinus Maw, MD  hydrOXYzine (ATARAX) 25 MG tablet Take 25 mg by mouth 3 (three) times daily as needed.   Yes [provider]  Menthol-Methyl Salicylate (MUSCLE RUB) 10-15 % CREA Apply 1 application topically 4 (four) times daily -  with meals and at bedtime. To neck and shoulders 01/21/18  Yes Love, Evlyn Kanner, PA-C  metoprolol tartrate (LOPRESSOR) 25 MG tablet TAKE 1/2 TABLET BY MOUTH TWICE DAILY 07/07/21  Yes Hilty,  Lisette Abu, MD  mirabegron ER (MYRBETRIQ) 25 MG TB24 tablet Take 25 mg by mouth daily.   Yes [provider]  Multiple Vitamin (MULTIVITAMIN) tablet Take 1 tablet by mouth daily. [employer] multi + Omega 3   Yes [provider]  niacin (NIASPAN) 1000 MG CR tablet    Yes [provider]  niacin (NIASPAN) 500 MG CR tablet Take 500 mg by mouth at bedtime.   Yes [provider]  Olmesartan Medoxomil (BENICAR PO)    Yes [provider]  pantoprazole (PROTONIX) 40 MG tablet Take 1 tablet by mouth once daily 03/03/22  Yes Allwardt, Alyssa M, PA-C  tamsulosin (FLOMAX) 0.4 MG CAPS capsule Take 1 capsule by mouth at bedtime 11/21/21  Yes Allwardt, Alyssa M, PA-C  cyclobenzaprine (FLEXERIL) 5 MG tablet Take 1 tablet (5 mg total) by mouth 3 (three) times daily as needed for muscle spasms. Patient not taking: Reported on 07/14/2022 02/19/22   Richardean Sale, DO  rosuvastatin (CRESTOR) 10 MG tablet Take 1 tablet (10 mg total) by mouth at bedtime. 02/07/21 05/08/21  Allwardt, Crist Infante, PA-C  Vitamin D, Ergocalciferol, (DRISDOL) 1.25 MG (50000 UNIT) CAPS capsule Take 1 capsule (50,000 Units total) by mouth every 7 (seven) days. 06/23/21   Allwardt, Crist Infante, PA-C    Allergies  Allergen Reactions   Ramipril Cough    Patient Active Problem List   Diagnosis Date  Noted   Clavi 01/30/2022   Hammer toes, bilateral 01/30/2022   Coagulation defect (HCC) 10/12/2019   Anemia, chronic disease    Left hemiparesis (HCC)    CKD (chronic kidney disease), stage III (HCC)    Peripheral edema    History of gout    Stage 3 chronic kidney disease (HCC)    Anemia of chronic disease    Left-sided weakness    Osteoarthritis    OSA (obstructive sleep apnea)    Noncompliance with CPAP treatment    History of DVT (deep vein thrombosis)    History of CVA (cerebrovascular accident)    Dysphagia, post-stroke    CKD (chronic kidney disease) stage 2, GFR 60-89 ml/min 01/01/2018    Sensorineural hearing loss (SNHL), 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   Bacteremia due to Streptococcus 12/10/2016   Renal insufficiency 12/10/2016   Hypophosphatemia 12/10/2016   OBESITY, UNSPECIFIED 05/31/2009   Hyperlipidemia 05/30/2009   Obstructive sleep apnea 05/30/2009   Essential hypertension 05/30/2009   CAD (coronary artery disease) 05/30/2009    Past Medical History:  Diagnosis Date   CAD (coronary artery disease)    s/p cath in 2005 and CABG x4   Cataract    DJD (degenerative joint disease)    DJD (degenerative joint disease)    Fluid retention    mild   H/O: gout    History of blood clots    HTN (hypertension)    Hyperlipidemia    Hyperlipidemia    Myocardial infarction (HCC) 2009   Obesity    with reduction   OSA (obstructive sleep apnea)    AHI 98/hr   Renal insufficiency    Sleep apnea    Stroke (HCC) 2019    Past Surgical History:  Procedure Laterality Date   CORONARY ARTERY BYPASS GRAFT  2005   LIMA to LAD, SVG to OM1 and OM 2, RCA.    left inguinal hernia repair     LOOP RECORDER INSERTION N/A 01/03/2018   Procedure: LOOP RECORDER INSERTION;  Surgeon: Marinus Maw, MD;  Location: [facility] INVASIVE CV [facility];  Service: Cardiovascular;  Laterality: N/A;   TEE WITHOUT CARDIOVERSION N/A 12/11/2016   Procedure: TRANSESOPHAGEAL ECHOCARDIOGRAM (TEE);  Surgeon: Chrystie Nose, MD;  Location: [facility] ENDOSCOPY;  Service: Cardiovascular;  Laterality: N/A;   TEE WITHOUT CARDIOVERSION N/A 01/03/2018   Procedure: TRANSESOPHAGEAL ECHOCARDIOGRAM (TEE);  Surgeon: Lars Masson, MD;  Location: [facility] ENDOSCOPY;  Service: Cardiovascular;  Laterality: N/A;    Social History   Socioeconomic History   Marital status: Married    Spouse name: [place]   Number of children: Not on file   Years of education: Not on file   Highest education level: Not on file  Occupational History   Not on file  Tobacco Use   Smoking status: Former    Types: Cigars   Smokeless tobacco: Never   Tobacco  comments:    used to smoke cigars, 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, none since  Vaping Use   Vaping Use: Never used  Substance and Sexual Activity   Alcohol use: No   Drug use: No   Sexual activity: Not on file  Other Topics Concern   Not on file  Social History Narrative   Not on file   Social Determinants of Health   Financial Resource Strain: Low Risk  (12/24/2020)   Overall Financial Resource Strain (CARDIA)    Difficulty of Paying Living Expenses: Not very hard  Food Insecurity: Not on file  Transportation  Needs: Not on file  Physical Activity: Not on file  Stress: Not on file  Social Connections: Not on file  Intimate Partner Violence: Not on file    Family History  Problem Relation Age of Onset   Gout Mother    Stroke Mother    Coronary artery disease Mother    Hypertension Mother    Arthritis Mother    Coronary artery disease Father    Arthritis Father    Gout Father    Stroke Father    Hypertension Father    Coronary artery disease Brother    Arthritis Brother    Gout Brother    Stroke Brother    Hypertension Brother    Coronary artery disease Brother    Arthritis Brother    Gout Brother    Stroke Brother    Hypertension Brother    Coronary artery disease Other        significant in multiple family members     Review of Systems  Constitutional:  Positive for malaise/fatigue.  HENT: Negative.  Negative for congestion and sore throat.   Respiratory: Negative.  Negative for cough and shortness of breath.   Cardiovascular: Negative.  Negative for chest pain and palpitations.  Gastrointestinal:  Negative for abdominal pain, diarrhea, nausea and vomiting.  Genitourinary: Negative.  Negative for dysuria and hematuria.  Skin: Negative.  Negative for rash.  Neurological:  Positive for weakness.  All other systems reviewed and are negative.   Vitals:   07/14/22 1017  BP: 126/62  Pulse: (!) 56  Temp: 98.6 F (37 C)  SpO2: 98%    Physical  Exam Vitals reviewed.  Constitutional:      Appearance: Normal appearance.  HENT:     Head: Normocephalic.     Mouth/Throat:     Mouth: Mucous membranes are moist.     Pharynx: Oropharynx is clear.  Eyes:     Extraocular Movements: Extraocular movements intact.     Pupils: Pupils are equal, round, and reactive to light.  Cardiovascular:     Rate and Rhythm: Normal rate and regular rhythm.     Pulses: Normal pulses.     Heart sounds: Normal heart sounds.  Pulmonary:     Effort: Pulmonary effort is normal.     Breath sounds: Normal breath sounds.  Musculoskeletal:     Cervical back: No tenderness.  Lymphadenopathy:     Cervical: No cervical adenopathy.  Skin:    General: Skin is warm and dry.     Capillary Refill: Capillary refill takes less than 2 seconds.  Neurological:     Mental Status: He is alert and oriented to person, place, and time.     Comments: Mild residual left arm weakness  Psychiatric:        Mood and Affect: Mood normal.        Behavior: Behavior normal.      ASSESSMENT & PLAN: A total of 48 minutes was spent with the patient and counseling/coordination of care regarding preparing for this visit, review of available medical records, establishing care with me, review of multiple chronic medical conditions and their management, review of all medications, establishing care with me, education on nutrition, prognosis, documentation, need for follow-up.  Problem List Items Addressed This Visit       Cardiovascular and Mediastinum   Essential hypertension - Primary    BP Readings from Last 3 Encounters:  07/14/22 126/62  06/18/22 122/68  02/25/22 132/82  Well-controlled hypertension. Continue amlodipine 5  mg daily and Benicar 40 mg daily       Relevant Medications   rosuvastatin (CRESTOR) 10 MG tablet   Other Relevant Orders   Comprehensive metabolic panel   Hemoglobin A1c   CAD (coronary artery disease)    Stable.  No recent anginal  episodes. Continue metoprolol tartrate 12.5 mg twice a day Sees cardiologist on a regular basis      Relevant Medications   rosuvastatin (CRESTOR) 10 MG tablet     Respiratory   OSA (obstructive sleep apnea)    Noncompliant with CPAP treatment        Musculoskeletal and Integument   Hammer toes, bilateral     Genitourinary   Stage 3 chronic kidney disease (HCC)    Stable. Advised to stay well-hydrated and avoid NSAIDs        Hematopoietic and Hemostatic   Coagulation defect (HCC)    On long-term anticoagulation with Eliquis 5 mg twice daily Fall precautions given        Other   Hyperlipidemia    Stable chronic condition Continue rosuvastatin 10 mg daily      Relevant Medications   rosuvastatin (CRESTOR) 10 MG tablet   History of CVA (cerebrovascular accident)    Stable.  Secondary stroke prevention discussed Well-controlled hypertension On cholesterol medication Continues Eliquis 5 mg twice a day Diet and nutrition discussed      Anemia, chronic disease    Stable.  CBC done today.      Relevant Orders   CBC with Differential/Platelet   Other Visit Diagnoses     Hammer toes of both feet       Relevant Orders   Ambulatory referral to Podiatry   Encounter to establish care          Patient Instructions  Health Maintenance After Age 81 After age 70, you are at a higher risk for certain long-term diseases and infections as well as injuries from falls. Falls are a major cause of broken bones and head injuries in people who are older than age 21. Getting regular preventive care can help to keep you healthy and well. Preventive care includes getting regular testing and making lifestyle changes as recommended by your health care provider. Talk with your health care provider about: Which screenings and tests you should have. A screening is a test that checks for a disease when you have no symptoms. A diet and exercise plan that is right for you. What should I  know about screenings and tests to prevent falls? Screening and testing are the best ways to find a health problem early. Early diagnosis and treatment give you the best chance of managing medical conditions that are common after age 8. Certain conditions and lifestyle choices may make you more likely to have a fall. Your health care provider may recommend: Regular vision checks. Poor vision and conditions such as cataracts can make you more likely to have a fall. If you wear glasses, make sure to get your prescription updated if your vision changes. Medicine review. Work with your health care provider to regularly review all of the medicines you are taking, including over-the-counter medicines. Ask your health care provider about any side effects that may make you more likely to have a fall. Tell your health care provider if any medicines that you take make you feel dizzy or sleepy. Strength and balance checks. Your health care provider may recommend certain tests to check your strength and balance while standing, walking, or changing  positions. Foot health exam. Foot pain and numbness, as well as not wearing proper footwear, can make you more likely to have a fall. Screenings, including: Osteoporosis screening. Osteoporosis is a condition that causes the bones to get weaker and break more easily. Blood pressure screening. Blood pressure changes and medicines to control blood pressure can make you feel dizzy. Depression screening. You may be more likely to have a fall if you have a fear of falling, feel depressed, or feel unable to do activities that you used to do. Alcohol use screening. Using too much alcohol can affect your balance and may make you more likely to have a fall. Follow these instructions at home: Lifestyle Do not drink alcohol if: Your health care provider tells you not to drink. If you drink alcohol: Limit how much you have to: 0-1 drink a day for women. 0-2 drinks a day for  men. Know how much alcohol is in your drink. In the [place] one drink equals one 12 oz bottle of beer (355 mL), one 5 oz glass of wine (148 mL), or one 1 oz glass of hard liquor (44 mL). Do not use any products that contain nicotine or tobacco. These products include cigarettes, chewing tobacco, and vaping devices, such as e-cigarettes. If you need help quitting, ask your health care provider. Activity  Follow a regular exercise program to stay fit. This will help you maintain your balance. Ask your health care provider what types of exercise are appropriate for you. If you need a cane or walker, use it as recommended by your health care provider. Wear supportive shoes that have nonskid soles. Safety  Remove any tripping hazards, such as rugs, cords, and clutter. Install safety equipment such as grab bars in bathrooms and safety rails on stairs. Keep rooms and walkways well-lit. General instructions Talk with your health care provider about your risks for falling. Tell your health care provider if: You fall. Be sure to tell your health care provider about all falls, even ones that seem minor. You feel dizzy, tiredness (fatigue), or off-balance. Take over-the-counter and prescription medicines only as told by your health care provider. These include supplements. Eat a healthy diet and maintain a healthy weight. A healthy diet includes low-fat dairy products, low-fat (lean) meats, and fiber from whole grains, beans, and lots of fruits and vegetables. Stay current with your vaccines. Schedule regular health, dental, and eye exams. Summary Having a healthy lifestyle and getting preventive care can help to protect your health and wellness after age 39. Screening and testing are the best way to find a health problem early and help you avoid having a fall. Early diagnosis and treatment give you the best chance for managing medical conditions that are more common for people who are older than age  19. Falls are a major cause of broken bones and head injuries in people who are older than age 56. Take precautions to prevent a fall at home. Work with your health care provider to learn what changes you can make to improve your health and wellness and to prevent falls. This information is not intended to replace advice given to you by your health care provider. Make sure you discuss any questions you have with your health care provider. Document Revised: 07/08/2020 Document Reviewed: 07/08/2020 Elsevier Patient Education  2023 Elsevier Inc.     Edwina Barth, MD [facility]

## 2022-07-14 NOTE — Assessment & Plan Note (Signed)
Stable chronic condition.  Continue rosuvastatin 10 mg daily. 

## 2022-07-14 NOTE — Assessment & Plan Note (Signed)
On long-term anticoagulation with Eliquis 5 mg twice daily Fall precautions given

## 2022-07-15 DIAGNOSIS — N3000 Acute cystitis without hematuria: Secondary | ICD-10-CM | POA: Diagnosis not present

## 2022-07-15 DIAGNOSIS — R350 Frequency of micturition: Secondary | ICD-10-CM | POA: Diagnosis not present

## 2022-07-15 DIAGNOSIS — R3915 Urgency of urination: Secondary | ICD-10-CM | POA: Diagnosis not present

## 2022-08-04 ENCOUNTER — Emergency Department (HOSPITAL_COMMUNITY)
Admission: EM | Admit: 2022-08-04 | Discharge: 2022-08-04 | Disposition: A | Discharge status: Home / Self Care | Payer: Medicare Other | Attending: Emergency Medicine | Admitting: Emergency Medicine

## 2022-08-04 ENCOUNTER — Emergency Department (HOSPITAL_COMMUNITY): Payer: Medicare Other

## 2022-08-04 ENCOUNTER — Other Ambulatory Visit: Payer: Self-pay

## 2022-08-04 ENCOUNTER — Encounter (HOSPITAL_COMMUNITY): Payer: Self-pay

## 2022-08-04 DIAGNOSIS — Z043 Encounter for examination and observation following other accident: Secondary | ICD-10-CM | POA: Diagnosis not present

## 2022-08-04 DIAGNOSIS — M545 Low back pain, unspecified: Secondary | ICD-10-CM | POA: Diagnosis not present

## 2022-08-04 DIAGNOSIS — Z7901 Long term (current) use of anticoagulants: Secondary | ICD-10-CM | POA: Diagnosis not present

## 2022-08-04 DIAGNOSIS — S50312A Abrasion of left elbow, initial encounter: Secondary | ICD-10-CM | POA: Diagnosis not present

## 2022-08-04 DIAGNOSIS — M25522 Pain in left elbow: Secondary | ICD-10-CM | POA: Diagnosis present

## 2022-08-04 DIAGNOSIS — Z23 Encounter for immunization: Secondary | ICD-10-CM | POA: Diagnosis not present

## 2022-08-04 DIAGNOSIS — W19XXXA Unspecified fall, initial encounter: Secondary | ICD-10-CM | POA: Diagnosis not present

## 2022-08-04 MED ORDER — LIDOCAINE 5 % EX PTCH: 1.0000 | MEDICATED_PATCH | CUTANEOUS | 0 refills | Status: AC

## 2022-08-04 MED ORDER — ACETAMINOPHEN 500 MG PO TABS
1000.0000 mg | ORAL_TABLET | Freq: Once | ORAL | Status: AC
  Administered 2022-08-04: 1000 mg via ORAL
  Filled 2022-08-04: qty 2

## 2022-08-04 MED ORDER — TETANUS-DIPHTH-ACELL PERTUSSIS 5-2.5-18.5 LF-MCG/0.5 IM SUSY
0.5000 mL | PREFILLED_SYRINGE | Freq: Once | INTRAMUSCULAR | Status: AC
  Administered 2022-08-04: 0.5 mL via INTRAMUSCULAR
  Filled 2022-08-04: qty 0.5

## 2022-08-04 NOTE — ED Provider Notes (Signed)
[place] EMERGENCY DEPARTMENT AT [facility] Provider Note   CSN: [id number] Arrival date & time: 08/04/22  1227     History  Chief Complaint  Patient presents with   Back Pain   Arm Pain    Left arm pain s/p fall on Thursday.  Able to move arm but painful    [name] is a 82 y.o. male.  82 year old male with prior medical history as detailed below presents for evaluation.  Patient reports that he fell last week on Thursday.  Since the fall he is experienced mild low back pain and mild soreness of his left elbow.  He did strike his elbow against the ground when he fell.  He has a superficial abrasion to the left elbow.  He is unsure of his last tetanus.  Patient is ambulatory at baseline.  He reports increased pain in his low back with ambulation or attempted heavy lifting.  He has not taken anything for his pain apparently.  The history is provided by the patient and medical records.       Home Medications Prior to Admission medications   Medication Sig Start Date End Date Taking? Authorizing Provider  acetaminophen (TYLENOL) 325 MG tablet Take 1-2 tablets (325-650 mg total) by mouth every 4 (four) hours as needed for mild pain. 01/21/18   Love, Evlyn Kanner, PA-C  allopurinol (ZYLOPRIM) 100 MG tablet Take 2 tablets by mouth once daily 07/14/22   Allwardt, Alyssa M, PA-C  amLODipine (NORVASC) 5 MG tablet Take 1 tablet by mouth once daily 06/18/22   Allwardt, Alyssa M, PA-C  apixaban (ELIQUIS) 5 MG TABS tablet Take 1 tablet by mouth twice daily 02/02/22   Hilty, Lisette Abu, MD  cyclobenzaprine (FLEXERIL) 5 MG tablet Take 1 tablet (5 mg total) by mouth 3 (three) times daily as needed for muscle spasms. Patient not taking: Reported on 07/14/2022 02/19/22   Richardean Sale, DO  finasteride (PROSCAR) 5 MG tablet Take 5 mg by mouth daily. 05/29/22   [provider]  furosemide (LASIX) 20 MG tablet Take 1/2 (one-half) tablet by mouth once daily 09/15/21   Marinus Maw, MD  Menthol-Methyl Salicylate (MUSCLE RUB) 10-15 % CREA Apply 1 application topically 4 (four) times daily -  with meals and at bedtime. To neck and shoulders 01/21/18   Love, Evlyn Kanner, PA-C  metoprolol tartrate (LOPRESSOR) 25 MG tablet TAKE 1/2 TABLET BY MOUTH TWICE DAILY 07/07/21   Hilty, Lisette Abu, MD  mirabegron ER (MYRBETRIQ) 25 MG TB24 tablet Take 25 mg by mouth daily.    [provider]  Multiple Vitamin (MULTIVITAMIN) tablet Take 1 tablet by mouth daily. [employer] multi + Omega 3    [provider]  niacin (NIASPAN) 1000 MG CR tablet     [provider]  niacin (NIASPAN) 500 MG CR tablet Take 500 mg by mouth at bedtime.    [provider]  Olmesartan Medoxomil (BENICAR PO)     [provider]  pantoprazole (PROTONIX) 40 MG tablet Take 1 tablet by mouth once daily 07/14/22   Allwardt, Alyssa M, PA-C  rosuvastatin (CRESTOR) 10 MG tablet Take 1 tablet (10 mg total) by mouth at bedtime. 07/14/22 10/12/22  Georgina Quint, MD  tamsulosin [facility]) 0.4 MG CAPS capsule Take 1 capsule by mouth at bedtime 11/21/21   Allwardt, Alyssa M, PA-C  Vitamin D, Ergocalciferol, (DRISDOL) 1.25 MG (50000 UNIT) CAPS capsule Take 1 capsule (50,000 Units total) by mouth every 7 (seven)  days. 06/23/21   Allwardt, Crist Infante, PA-C      Allergies    Ramipril    Review of Systems   Review of Systems  All other systems reviewed and are negative.   Physical Exam Updated Vital Signs BP (!) 152/91   Pulse 60   Temp 98.2 F (36.8 C) (Oral)   Resp 18   Ht 6\' 2"  (1.88 m)   Wt 105.2 kg   SpO2 100%   BMI 29.79 kg/m  Physical Exam Vitals and nursing note reviewed.  Constitutional:      General: He is not in acute distress.    Appearance: Normal appearance. He is well-developed.  HENT:     Head: Normocephalic and atraumatic.  Eyes:     Conjunctiva/sclera: Conjunctivae normal.     Pupils: Pupils are equal, round, and reactive to light.  Cardiovascular:      Rate and Rhythm: Normal rate and regular rhythm.     Heart sounds: Normal heart sounds.  Pulmonary:     Effort: Pulmonary effort is normal. No respiratory distress.     Breath sounds: Normal breath sounds.  Abdominal:     General: There is no distension.     Palpations: Abdomen is soft.     Tenderness: There is no abdominal tenderness.  Musculoskeletal:        General: Tenderness present. No deformity. Normal range of motion.     Cervical back: Normal range of motion and neck supple.     Comments: Mild tenderness to the lateral aspect of the left elbow.  Superficial abrasion healing appropriately on the lateral aspect of the left elbow.  Full active range of motion of the left elbow and left wrist.  Mild midline diffuse tenderness in the lumbar spine.  Minimal paraspinal tenderness as well.  No step-off or ecchymosis or break in the skin noted.  Patient ambulatory at baseline.  Skin:    General: Skin is warm and dry.  Neurological:     General: No focal deficit present.     Mental Status: He is alert and oriented to person, place, and time.     ED Results / Procedures / Treatments   Labs (all labs ordered are listed, but only abnormal results are displayed) Labs Reviewed - No data to display  EKG None  Radiology No results found.  Procedures Procedures    Medications Ordered in ED Medications  Tdap (BOOSTRIX) injection 0.5 mL (0.5 mLs Intramuscular Given 08/04/22 1324)  acetaminophen (TYLENOL) tablet 1,000 mg (1,000 mg Oral Given 08/04/22 1323)    ED Course/ Medical Decision Making/ A&P                             Medical Decision Making Amount and/or Complexity of Data Reviewed Radiology: ordered.  Risk OTC drugs. Prescription drug management.    Medical Screen Complete  This patient presented to the ED with complaint of fall, elbow abrasion, back pain.  This complaint involves an extensive number of treatment options. The initial differential diagnosis  includes, but is not limited to, trauma related to fall  This presentation is: Acute, Self-Limited, Previously Undiagnosed, Uncertain Prognosis, Complicated, Systemic Symptoms, and Threat to Life/Bodily Function  Patient reports fall that occurred last week on Thursday.  Patient reports superficial abrasions to left elbow and pain to the left elbow and low back.  Patient is ambulatory at baseline.  Patient reports mild decrease in ability to ambulate secondary  to pain.  He has not take anything at home for his symptoms.  Imaging obtained is without significant acute abnormality.  Patient feels improved after Tylenol alone.  Patient advised to follow-up closely with his PCP.  Patient may benefit from physical therapy.    Strict return precautions given and understood.  Importance of close follow-up is stressed.  Additional history obtained: External records from outside sources obtained and reviewed including prior ED visits and prior Inpatient records.    Imaging Studies ordered:  I ordered imaging studies including plain film imaging of left elbow, left forearm, lumbar spine I independently visualized and interpreted obtained imaging which showed no acute pathology I agree with the radiologist interpretation.  Medicines ordered:  I ordered medication including Tylenol for pain Reevaluation of the patient after these medicines showed that the patient: improved    Problem List / ED Course:  Fall, left elbow abrasion, low back pain   Reevaluation:  After the interventions noted above, I reevaluated the patient and found that they have: improved   Disposition:  After consideration of the diagnostic results and the patients response to treatment, I feel that the patent would benefit from close outpatient follow-up.          Final Clinical Impression(s) / ED Diagnoses Final diagnoses:  Acute midline low back pain without sciatica  Fall, initial encounter    Rx  / DC Orders ED Discharge Orders          Ordered    lidocaine (LIDODERM) 5 %  Every 24 hours        08/04/22 1413              Wynetta Fines, MD 08/04/22 1418

## 2022-08-04 NOTE — Discharge Instructions (Signed)
Return for any problem.  Take Tylenol and use Lidoderm patch as instructed for pain.

## 2022-08-24 ENCOUNTER — Ambulatory Visit: Payer: Medicare Other | Admitting: Physician Assistant

## 2022-09-18 ENCOUNTER — Ambulatory Visit: Payer: Medicare Other | Admitting: Podiatry

## 2022-09-19 ENCOUNTER — Other Ambulatory Visit: Payer: Self-pay | Admitting: Internal Medicine

## 2022-09-25 ENCOUNTER — Other Ambulatory Visit: Payer: Self-pay | Admitting: Physician Assistant

## 2022-09-28 NOTE — Telephone Encounter (Signed)
Medication: Amlodipine 5 mg  Directions: Take 1 tablet by mouth daily  Last given: 06/18/22 Number refills: 0 Last o/v: 02/25/22 w/ Alyssa Allwardt, PA-C, and 07/14/22 with Dr. Alvy Bimler  Follow up: 01/14/23 Labs:

## 2022-10-02 ENCOUNTER — Telehealth: Payer: Self-pay

## 2022-10-02 NOTE — Telephone Encounter (Signed)
Pt wife came into the office to clarify the new order for Lasix's. Pt had been taking 20mg  of Lasix 1/2 tab. New order was for a whole tab. No evidence shown that Dr. Rennis Golden changed it to one whole tab. Clarified with Julaine Fusi, RN and there is no rhyme or reason as to why it was sent in this way. Prescription was sent by the [facility]. Office. Advised wife to have pt continue to taking it as prescribed by Dr. Rennis Golden. Verbalized understanding.

## 2022-10-08 ENCOUNTER — Ambulatory Visit: Payer: Medicare Other | Admitting: Podiatry

## 2022-10-08 ENCOUNTER — Encounter: Payer: Self-pay | Admitting: Podiatry

## 2022-10-08 DIAGNOSIS — M79675 Pain in left toe(s): Secondary | ICD-10-CM

## 2022-10-08 DIAGNOSIS — L84 Corns and callosities: Secondary | ICD-10-CM

## 2022-10-08 DIAGNOSIS — M79674 Pain in right toe(s): Secondary | ICD-10-CM | POA: Diagnosis not present

## 2022-10-08 DIAGNOSIS — B351 Tinea unguium: Secondary | ICD-10-CM

## 2022-10-08 DIAGNOSIS — N182 Chronic kidney disease, stage 2 (mild): Secondary | ICD-10-CM

## 2022-10-08 NOTE — Progress Notes (Signed)
This patient returns to my office for at risk foot care.  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  Patient is taking eliquis.  This patient is unable to cut nails himself since the patient cannot reach his nails.These nails are painful walking and wearing shoes.  This patient presents for at risk foot care today.  General Appearance  Alert, conversant and in no acute stress.  Vascular  Dorsalis pedis  are weakly palpable bilaterally.  Posterior tibial pulses are absent due to swelling. Posterior tibial pulses are not palpable.Capillary return is within normal limits  bilaterally. Temperature is within normal limits  bilaterally.  Neurologic  Senn-Weinstein monofilament wire test within normal limits  bilaterally. Muscle power within normal limits bilaterally.  Nails Thick disfigured discolored nails with subungual debris  from hallux to fifth toes bilaterally. No evidence of bacterial infection or drainage bilaterally.  Orthopedic  No limitations of motion  feet .  No crepitus or effusions noted.  No bony pathology or digital deformities noted.  HAV  B/L.  Skin  normotropic skin with no porokeratosis noted bilaterally.  No signs of infections or ulcers noted.   Clavi 3rd toe right foot.  Onychomycosis  Pain in right toes  Pain in left toes  Clavi third toe right foot.  Consent was obtained for treatment procedures.   Mechanical debridement of nails 1-5  bilaterally performed with a nail nipper.  Filed with dremel without incident.  Debride cclavi with dremel.    Return office visit  10 weeks                Told patient to return for periodic foot care and evaluation due to potential at risk complications.   Helane Gunther DPM

## 2022-10-13 ENCOUNTER — Other Ambulatory Visit: Payer: Self-pay

## 2022-10-13 ENCOUNTER — Other Ambulatory Visit: Payer: Self-pay | Admitting: Physician Assistant

## 2022-10-20 ENCOUNTER — Ambulatory Visit: Payer: Medicare Other

## 2022-10-22 ENCOUNTER — Other Ambulatory Visit: Payer: Self-pay | Admitting: Physician Assistant

## 2022-10-27 ENCOUNTER — Other Ambulatory Visit: Payer: Self-pay | Admitting: Internal Medicine

## 2022-10-29 NOTE — Progress Notes (Signed)
This encounter was created in error - please disregard. No answer.  I called both numbers.  I left a detailed VM for patient to call back to reschedule.

## 2022-11-25 DIAGNOSIS — H524 Presbyopia: Secondary | ICD-10-CM | POA: Diagnosis not present

## 2022-11-25 DIAGNOSIS — H5211 Myopia, right eye: Secondary | ICD-10-CM | POA: Diagnosis not present

## 2022-11-25 DIAGNOSIS — H52221 Regular astigmatism, right eye: Secondary | ICD-10-CM | POA: Diagnosis not present

## 2022-11-25 DIAGNOSIS — H2511 Age-related nuclear cataract, right eye: Secondary | ICD-10-CM | POA: Diagnosis not present

## 2022-12-02 ENCOUNTER — Telehealth: Payer: Self-pay | Admitting: Internal Medicine

## 2022-12-03 NOTE — Telephone Encounter (Signed)
[name]  You22 hours ago (4:45 PM)   TE Pt wife came to clinic today asking about medication Furosemide 20mg .  Pt is confused on if he should be taking 1/2 or 1 whole pill everyday. Please give them a call [phone], this is his wife [name]'s phone number.  Thank you!   Left message to call back   Please reference 10/02/22 phone note as this was already addressed however med list was NOT updated at this time and a subsequent refill was sent on 8/27 for lasix 20mg  every day  Refill was initially sent incorrectly on 09/19/22  Per last MD note, patient should be on lasix 20mg  - half-tab

## 2022-12-14 ENCOUNTER — Other Ambulatory Visit: Payer: Self-pay | Admitting: Internal Medicine

## 2022-12-28 ENCOUNTER — Other Ambulatory Visit: Payer: Self-pay | Admitting: Emergency Medicine

## 2023-01-12 DIAGNOSIS — R3912 Poor urinary stream: Secondary | ICD-10-CM | POA: Diagnosis not present

## 2023-01-12 DIAGNOSIS — R350 Frequency of micturition: Secondary | ICD-10-CM | POA: Diagnosis not present

## 2023-01-14 ENCOUNTER — Ambulatory Visit: Payer: Medicare Other | Admitting: Emergency Medicine

## 2023-01-14 ENCOUNTER — Encounter: Payer: Self-pay | Admitting: Emergency Medicine

## 2023-01-14 VITALS — BP 134/76 | HR 54 | Temp 97.9°F | Ht 74.0 in | Wt 230.2 lb

## 2023-01-14 DIAGNOSIS — G4733 Obstructive sleep apnea (adult) (pediatric): Secondary | ICD-10-CM

## 2023-01-14 DIAGNOSIS — Z23 Encounter for immunization: Secondary | ICD-10-CM | POA: Diagnosis not present

## 2023-01-14 DIAGNOSIS — N1831 Chronic kidney disease, stage 3a: Secondary | ICD-10-CM | POA: Diagnosis not present

## 2023-01-14 DIAGNOSIS — I25709 Atherosclerosis of coronary artery bypass graft(s), unspecified, with unspecified angina pectoris: Secondary | ICD-10-CM

## 2023-01-14 DIAGNOSIS — E785 Hyperlipidemia, unspecified: Secondary | ICD-10-CM

## 2023-01-14 DIAGNOSIS — Z8673 Personal history of transient ischemic attack (TIA), and cerebral infarction without residual deficits: Secondary | ICD-10-CM

## 2023-01-14 DIAGNOSIS — G8194 Hemiplegia, unspecified affecting left nondominant side: Secondary | ICD-10-CM | POA: Diagnosis not present

## 2023-01-14 DIAGNOSIS — D689 Coagulation defect, unspecified: Secondary | ICD-10-CM | POA: Diagnosis not present

## 2023-01-14 DIAGNOSIS — I10 Essential (primary) hypertension: Secondary | ICD-10-CM | POA: Diagnosis not present

## 2023-01-14 MED ORDER — ROSUVASTATIN CALCIUM 10 MG PO TABS: 10.0000 mg | ORAL_TABLET | Freq: Every day | ORAL | 3 refills | Status: AC

## 2023-01-14 NOTE — Assessment & Plan Note (Signed)
Well-controlled hypertension. Continue amlodipine 5 mg daily and Benicar 40 mg daily

## 2023-01-14 NOTE — Assessment & Plan Note (Signed)
Stable.  Secondary stroke prevention discussed Well-controlled hypertension On cholesterol medication Continues Eliquis 5 mg twice a day Diet and nutrition discussed

## 2023-01-14 NOTE — Assessment & Plan Note (Signed)
Stable.  No recent anginal episodes. Continue metoprolol tartrate 12.5 mg twice a day Sees cardiologist on a regular basis

## 2023-01-14 NOTE — Assessment & Plan Note (Signed)
Stable.  Advised to stay well-hydrated and avoid NSAIDs. 

## 2023-01-14 NOTE — Assessment & Plan Note (Signed)
Noncompliant with CPAP treatment

## 2023-01-14 NOTE — Progress Notes (Signed)
[name] 82 y.o.   Chief Complaint  Patient presents with   Medical Management of Chronic Issues    f/u appt, no concerns     HISTORY OF PRESENT ILLNESS: This is a 82 y.o. male here for 26-month follow-up of multiple chronic medical conditions.  Accompanied by wife. Overall doing well.  Has no complaints or medical concerns today.  HPI   Prior to Admission medications   Medication Sig Start Date End Date Taking? Authorizing Provider  acetaminophen (TYLENOL) 325 MG tablet Take 1-2 tablets (325-650 mg total) by mouth every 4 (four) hours as needed for mild pain. 01/21/18  Yes Love, Evlyn Kanner, PA-C  allopurinol (ZYLOPRIM) 100 MG tablet Take 2 tablets by mouth once daily 10/22/22  Yes Allwardt, Alyssa M, PA-C  amLODipine (NORVASC) 5 MG tablet Take 1 tablet by mouth once daily 12/28/22  Yes Kynnedy Carreno, Eilleen Kempf, MD  apixaban (ELIQUIS) 5 MG TABS tablet Take 1 tablet by mouth twice daily 12/15/22  Yes Hilty, Lisette Abu, MD  finasteride (PROSCAR) 5 MG tablet Take 5 mg by mouth daily. 05/29/22  Yes [provider]  furosemide (LASIX) 20 MG tablet Take 1 tablet (20 mg total) by mouth daily. 10/27/22  Yes Hilty, Lisette Abu, MD  lidocaine (LIDODERM) 5 % Place 1 patch onto the skin daily. Remove & Discard patch within 12 hours or as directed by MD 08/04/22  Yes Wynetta Fines, MD  Menthol-Methyl Salicylate (MUSCLE RUB) 10-15 % CREA Apply 1 application topically 4 (four) times daily -  with meals and at bedtime. To neck and shoulders 01/21/18  Yes Love, Evlyn Kanner, PA-C  metoprolol tartrate (LOPRESSOR) 25 MG tablet Take 1/2 (one-half) tablet by mouth twice daily 12/15/22  Yes Hilty, Lisette Abu, MD  mirabegron ER (MYRBETRIQ) 25 MG TB24 tablet Take 25 mg by mouth daily.   Yes [provider]  Multiple Vitamin (MULTIVITAMIN) tablet Take 1 tablet by mouth daily. [employer] multi + Omega 3   Yes [provider]  niacin (NIASPAN) 1000 MG CR tablet    Yes [provider]   niacin (NIASPAN) 500 MG CR tablet Take 500 mg by mouth at bedtime.   Yes [provider]  Olmesartan Medoxomil (BENICAR PO)    Yes [provider]  pantoprazole (PROTONIX) 40 MG tablet Take 1 tablet by mouth once daily 10/13/22  Yes Allwardt, Alyssa M, PA-C  tamsulosin (FLOMAX) 0.4 MG CAPS capsule Take 1 capsule by mouth at bedtime 11/21/21  Yes Allwardt, Alyssa M, PA-C  Vitamin D, Ergocalciferol, (DRISDOL) 1.25 MG (50000 UNIT) CAPS capsule Take 1 capsule (50,000 Units total) by mouth every 7 (seven) days. 06/23/21  Yes Allwardt, Alyssa M, PA-C  cyclobenzaprine (FLEXERIL) 5 MG tablet Take 1 tablet (5 mg total) by mouth 3 (three) times daily as needed for muscle spasms. Patient not taking: Reported on 07/14/2022 02/19/22   Richardean Sale, DO  rosuvastatin (CRESTOR) 10 MG tablet Take 1 tablet (10 mg total) by mouth at bedtime. 07/14/22 10/12/22  Georgina Quint, MD    Allergies  Allergen Reactions   Ramipril Cough    Patient Active Problem List   Diagnosis Date Noted   Clavi 01/30/2022   Hammer toes, bilateral 01/30/2022   Coagulation defect (HCC) 10/12/2019   Anemia, chronic disease    Left hemiparesis (HCC)    CKD (chronic kidney disease), stage III (HCC)    History of gout    Stage 3 chronic kidney disease (HCC)    Anemia  of chronic disease    Left-sided weakness    Osteoarthritis    OSA (obstructive sleep apnea)    Noncompliance with CPAP treatment    History of DVT (deep vein thrombosis)    History of CVA (cerebrovascular accident)    Dysphagia, post-stroke    CKD (chronic kidney disease) stage 2, GFR 60-89 ml/min 01/01/2018   Sensorineural hearing loss (SNHL), 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   Renal insufficiency 12/10/2016   Hypophosphatemia 12/10/2016   OBESITY, UNSPECIFIED 05/31/2009   Hyperlipidemia 05/30/2009   Obstructive sleep apnea 05/30/2009   Essential hypertension 05/30/2009   CAD (coronary artery disease) 05/30/2009    Past Medical History:   Diagnosis Date   CAD (coronary artery disease)    s/p cath in 2005 and CABG x4   Cataract    DJD (degenerative joint disease)    DJD (degenerative joint disease)    Fluid retention    mild   H/O: gout    History of blood clots    HTN (hypertension)    Hyperlipidemia    Hyperlipidemia    Myocardial infarction (HCC) 2009   Obesity    with reduction   OSA (obstructive sleep apnea)    AHI 98/hr   Renal insufficiency    Sleep apnea    Stroke (HCC) 2019    Past Surgical History:  Procedure Laterality Date   CORONARY ARTERY BYPASS GRAFT  2005   LIMA to LAD, SVG to OM1 and OM 2, RCA.    left inguinal hernia repair     LOOP RECORDER INSERTION N/A 01/03/2018   Procedure: LOOP RECORDER INSERTION;  Surgeon: Marinus Maw, MD;  Location: [facility] INVASIVE CV [facility];  Service: Cardiovascular;  Laterality: N/A;   TEE WITHOUT CARDIOVERSION N/A 12/11/2016   Procedure: TRANSESOPHAGEAL ECHOCARDIOGRAM (TEE);  Surgeon: Chrystie Nose, MD;  Location: [facility] ENDOSCOPY;  Service: Cardiovascular;  Laterality: N/A;   TEE WITHOUT CARDIOVERSION N/A 01/03/2018   Procedure: TRANSESOPHAGEAL ECHOCARDIOGRAM (TEE);  Surgeon: Lars Masson, MD;  Location: [facility] ENDOSCOPY;  Service: Cardiovascular;  Laterality: N/A;    Social History   Socioeconomic History   Marital status: Married    Spouse name: [place]   Number of children: Not on file   Years of education: Not on file   Highest education level: Not on file  Occupational History   Not on file  Tobacco Use   Smoking status: Former    Types: Cigars   Smokeless tobacco: Never   Tobacco comments:    used to smoke cigars, 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, none since  Vaping Use   Vaping status: Never Used  Substance and Sexual Activity   Alcohol use: No   Drug use: No   Sexual activity: Not on file  Other Topics Concern   Not on file  Social History Narrative   Not on file   Social Determinants of Health   Financial Resource Strain: Low Risk   (12/24/2020)   Overall Financial Resource Strain (CARDIA)    Difficulty of Paying Living Expenses: Not very hard  Food Insecurity: Not on file  Transportation Needs: Not on file  Physical Activity: Not on file  Stress: Not on file  Social Connections: Not on file  Intimate Partner Violence: Not on file    Family History  Problem Relation Age of Onset   Gout Mother    Stroke Mother    Coronary artery disease Mother    Hypertension Mother    Arthritis Mother    Coronary  artery disease Father    Arthritis Father    Gout Father    Stroke Father    Hypertension Father    Coronary artery disease Brother    Arthritis Brother    Gout Brother    Stroke Brother    Hypertension Brother    Coronary artery disease Brother    Arthritis Brother    Gout Brother    Stroke Brother    Hypertension Brother    Coronary artery disease Other        significant in multiple family members     Review of Systems  Constitutional: Negative.  Negative for chills and fever.  HENT: Negative.  Negative for congestion and sore throat.   Respiratory: Negative.  Negative for cough and shortness of breath.   Cardiovascular: Negative.  Negative for chest pain and palpitations.  Gastrointestinal:  Negative for abdominal pain, diarrhea, nausea and vomiting.  Genitourinary: Negative.  Negative for dysuria and hematuria.  Skin: Negative.  Negative for rash.  Neurological: Negative.  Negative for dizziness and headaches.  All other systems reviewed and are negative.   Today's Vitals   01/14/23 1303  BP: 134/76  Pulse: (!) 54  Temp: 97.9 F (36.6 C)  TempSrc: Oral  SpO2: 97%  Weight: 230 lb 4 oz (104.4 kg)  Height: 6\' 2"  (1.88 m)   Body mass index is 29.56 kg/m.   Physical Exam Vitals reviewed.  Constitutional:      Appearance: Normal appearance.  HENT:     Head: Normocephalic.     Mouth/Throat:     Mouth: Mucous membranes are moist.     Pharynx: Oropharynx is clear.  Eyes:      Extraocular Movements: Extraocular movements intact.     Pupils: Pupils are equal, round, and reactive to light.  Cardiovascular:     Rate and Rhythm: Normal rate and regular rhythm.     Pulses: Normal pulses.     Heart sounds: Normal heart sounds.  Pulmonary:     Effort: Pulmonary effort is normal.     Breath sounds: Normal breath sounds.  Skin:    General: Skin is warm and dry.  Neurological:     Mental Status: He is alert and oriented to person, place, and time.  Psychiatric:        Mood and Affect: Mood normal.        Behavior: Behavior normal.      ASSESSMENT & PLAN: A total of 45 minutes was spent with the patient and counseling/coordination of care regarding preparing for this visit, review of most recent office visit notes, review of multiple chronic medical conditions under management, review of most recent blood work results, review of all medications, review of health maintenance items, education on nutrition, prognosis, documentation, and need for follow-up  Problem List Items Addressed This Visit       Cardiovascular and Mediastinum   Essential hypertension - Primary    Well-controlled hypertension. Continue amlodipine 5 mg daily and Benicar 40 mg daily        Relevant Medications   rosuvastatin (CRESTOR) 10 MG tablet   CAD (coronary artery disease)    Stable.  No recent anginal episodes. Continue metoprolol tartrate 12.5 mg twice a day Sees cardiologist on a regular basis      Relevant Medications   rosuvastatin (CRESTOR) 10 MG tablet     Respiratory   Obstructive sleep apnea   OSA (obstructive sleep apnea)    Noncompliant with CPAP treatment  Nervous and Auditory   Left hemiparesis (HCC)     Genitourinary   Stage 3 chronic kidney disease (HCC)    Stable. Advised to stay well-hydrated and avoid NSAIDs        Hematopoietic and Hemostatic   Coagulation defect (HCC)    On long-term anticoagulation with Eliquis 5 mg twice daily Fall  precautions given        Other   History of CVA (cerebrovascular accident)    Stable.  Secondary stroke prevention discussed Well-controlled hypertension On cholesterol medication Continues Eliquis 5 mg twice a day Diet and nutrition discussed      Other Visit Diagnoses     Dyslipidemia       Relevant Medications   rosuvastatin (CRESTOR) 10 MG tablet   Need for vaccination       Relevant Orders   Flu Vaccine Trivalent High Dose (Fluad)      Patient Instructions  Health Maintenance After Age 44 After age 59, you are at a higher risk for certain long-term diseases and infections as well as injuries from falls. Falls are a major cause of broken bones and head injuries in people who are older than age 22. Getting regular preventive care can help to keep you healthy and well. Preventive care includes getting regular testing and making lifestyle changes as recommended by your health care provider. Talk with your health care provider about: Which screenings and tests you should have. A screening is a test that checks for a disease when you have no symptoms. A diet and exercise plan that is right for you. What should I know about screenings and tests to prevent falls? Screening and testing are the best ways to find a health problem early. Early diagnosis and treatment give you the best chance of managing medical conditions that are common after age 64. Certain conditions and lifestyle choices may make you more likely to have a fall. Your health care provider may recommend: Regular vision checks. Poor vision and conditions such as cataracts can make you more likely to have a fall. If you wear glasses, make sure to get your prescription updated if your vision changes. Medicine review. Work with your health care provider to regularly review all of the medicines you are taking, including over-the-counter medicines. Ask your health care provider about any side effects that may make you more  likely to have a fall. Tell your health care provider if any medicines that you take make you feel dizzy or sleepy. Strength and balance checks. Your health care provider may recommend certain tests to check your strength and balance while standing, walking, or changing positions. Foot health exam. Foot pain and numbness, as well as not wearing proper footwear, can make you more likely to have a fall. Screenings, including: Osteoporosis screening. Osteoporosis is a condition that causes the bones to get weaker and break more easily. Blood pressure screening. Blood pressure changes and medicines to control blood pressure can make you feel dizzy. Depression screening. You may be more likely to have a fall if you have a fear of falling, feel depressed, or feel unable to do activities that you used to do. Alcohol use screening. Using too much alcohol can affect your balance and may make you more likely to have a fall. Follow these instructions at home: Lifestyle Do not drink alcohol if: Your health care provider tells you not to drink. If you drink alcohol: Limit how much you have to: 0-1 drink a day for women. 0-2  drinks a day for men. Know how much alcohol is in your drink. In the [place] one drink equals one 12 oz bottle of beer (355 mL), one 5 oz glass of wine (148 mL), or one 1 oz glass of hard liquor (44 mL). Do not use any products that contain nicotine or tobacco. These products include cigarettes, chewing tobacco, and vaping devices, such as e-cigarettes. If you need help quitting, ask your health care provider. Activity  Follow a regular exercise program to stay fit. This will help you maintain your balance. Ask your health care provider what types of exercise are appropriate for you. If you need a cane or walker, use it as recommended by your health care provider. Wear supportive shoes that have nonskid soles. Safety  Remove any tripping hazards, such as rugs, cords, and  clutter. Install safety equipment such as grab bars in bathrooms and safety rails on stairs. Keep rooms and walkways well-lit. General instructions Talk with your health care provider about your risks for falling. Tell your health care provider if: You fall. Be sure to tell your health care provider about all falls, even ones that seem minor. You feel dizzy, tiredness (fatigue), or off-balance. Take over-the-counter and prescription medicines only as told by your health care provider. These include supplements. Eat a healthy diet and maintain a healthy weight. A healthy diet includes low-fat dairy products, low-fat (lean) meats, and fiber from whole grains, beans, and lots of fruits and vegetables. Stay current with your vaccines. Schedule regular health, dental, and eye exams. Summary Having a healthy lifestyle and getting preventive care can help to protect your health and wellness after age 64. Screening and testing are the best way to find a health problem early and help you avoid having a fall. Early diagnosis and treatment give you the best chance for managing medical conditions that are more common for people who are older than age 71. Falls are a major cause of broken bones and head injuries in people who are older than age 42. Take precautions to prevent a fall at home. Work with your health care provider to learn what changes you can make to improve your health and wellness and to prevent falls. This information is not intended to replace advice given to you by your health care provider. Make sure you discuss any questions you have with your health care provider. Document Revised: 07/08/2020 Document Reviewed: 07/08/2020 Elsevier Patient Education  2024 Elsevier Inc.       Edwina Barth, MD  Primary Care at [facility]

## 2023-01-14 NOTE — Assessment & Plan Note (Signed)
On long-term anticoagulation with Eliquis 5 mg twice daily Fall precautions given

## 2023-01-14 NOTE — Patient Instructions (Signed)
Health Maintenance After Age 80 After age 80, you are at a higher risk for certain long-term diseases and infections as well as injuries from falls. Falls are a major cause of broken bones and head injuries in people who are older than age 80. Getting regular preventive care can help to keep you healthy and well. Preventive care includes getting regular testing and making lifestyle changes as recommended by your health care provider. Talk with your health care provider about: Which screenings and tests you should have. A screening is a test that checks for a disease when you have no symptoms. A diet and exercise plan that is right for you. What should I know about screenings and tests to prevent falls? Screening and testing are the best ways to find a health problem early. Early diagnosis and treatment give you the best chance of managing medical conditions that are common after age 80. Certain conditions and lifestyle choices may make you more likely to have a fall. Your health care provider may recommend: Regular vision checks. Poor vision and conditions such as cataracts can make you more likely to have a fall. If you wear glasses, make sure to get your prescription updated if your vision changes. Medicine review. Work with your health care provider to regularly review all of the medicines you are taking, including over-the-counter medicines. Ask your health care provider about any side effects that may make you more likely to have a fall. Tell your health care provider if any medicines that you take make you feel dizzy or sleepy. Strength and balance checks. Your health care provider may recommend certain tests to check your strength and balance while standing, walking, or changing positions. Foot health exam. Foot pain and numbness, as well as not wearing proper footwear, can make you more likely to have a fall. Screenings, including: Osteoporosis screening. Osteoporosis is a condition that causes  the bones to get weaker and break more easily. Blood pressure screening. Blood pressure changes and medicines to control blood pressure can make you feel dizzy. Depression screening. You may be more likely to have a fall if you have a fear of falling, feel depressed, or feel unable to do activities that you used to do. Alcohol use screening. Using too much alcohol can affect your balance and may make you more likely to have a fall. Follow these instructions at home: Lifestyle Do not drink alcohol if: Your health care provider tells you not to drink. If you drink alcohol: Limit how much you have to: 0-1 drink a day for women. 0-2 drinks a day for men. Know how much alcohol is in your drink. In the [place] one drink equals one 12 oz bottle of beer (355 mL), one 5 oz glass of wine (148 mL), or one 1 oz glass of hard liquor (44 mL). Do not use any products that contain nicotine or tobacco. These products include cigarettes, chewing tobacco, and vaping devices, such as e-cigarettes. If you need help quitting, ask your health care provider. Activity  Follow a regular exercise program to stay fit. This will help you maintain your balance. Ask your health care provider what types of exercise are appropriate for you. If you need a cane or walker, use it as recommended by your health care provider. Wear supportive shoes that have nonskid soles. Safety  Remove any tripping hazards, such as rugs, cords, and clutter. Install safety equipment such as grab bars in bathrooms and safety rails on stairs. Keep rooms and walkways   well-lit. General instructions Talk with your health care provider about your risks for falling. Tell your health care provider if: You fall. Be sure to tell your health care provider about all falls, even ones that seem minor. You feel dizzy, tiredness (fatigue), or off-balance. Take over-the-counter and prescription medicines only as told by your health care provider. These include  supplements. Eat a healthy diet and maintain a healthy weight. A healthy diet includes low-fat dairy products, low-fat (lean) meats, and fiber from whole grains, beans, and lots of fruits and vegetables. Stay current with your vaccines. Schedule regular health, dental, and eye exams. Summary Having a healthy lifestyle and getting preventive care can help to protect your health and wellness after age 80. Screening and testing are the best way to find a health problem early and help you avoid having a fall. Early diagnosis and treatment give you the best chance for managing medical conditions that are more common for people who are older than age 80. Falls are a major cause of broken bones and head injuries in people who are older than age 80. Take precautions to prevent a fall at home. Work with your health care provider to learn what changes you can make to improve your health and wellness and to prevent falls. This information is not intended to replace advice given to you by your health care provider. Make sure you discuss any questions you have with your health care provider. Document Revised: 07/08/2020 Document Reviewed: 07/08/2020 Elsevier Patient Education  2024 Elsevier Inc.  

## 2023-01-15 ENCOUNTER — Other Ambulatory Visit: Payer: Self-pay | Admitting: Physician Assistant

## 2023-01-20 ENCOUNTER — Ambulatory Visit: Payer: Medicare Other | Admitting: Podiatry

## 2023-01-20 ENCOUNTER — Encounter: Payer: Self-pay | Admitting: Podiatry

## 2023-01-20 DIAGNOSIS — B351 Tinea unguium: Secondary | ICD-10-CM

## 2023-01-20 DIAGNOSIS — M79675 Pain in left toe(s): Secondary | ICD-10-CM | POA: Diagnosis not present

## 2023-01-20 DIAGNOSIS — M79674 Pain in right toe(s): Secondary | ICD-10-CM

## 2023-01-20 DIAGNOSIS — N182 Chronic kidney disease, stage 2 (mild): Secondary | ICD-10-CM

## 2023-01-20 NOTE — Progress Notes (Addendum)
This patient returns to my office for at risk foot care.  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  Patient is taking eliquis.  This patient is unable to cut nails himself since the patient cannot reach his nails.These nails are painful walking and wearing shoes.  This patient presents for at risk foot care today.  General Appearance  Alert, conversant and in no acute stress.  Vascular  Dorsalis pedis  are weakly palpable bilaterally.  Posterior tibial pulses are absent due to swelling. Posterior tibial pulses are not palpable.Capillary return is within normal limits  bilaterally. Temperature is within normal limits  bilaterally.  Neurologic  Senn-Weinstein monofilament wire test within normal limits  bilaterally. Muscle power within normal limits bilaterally.  Nails Thick disfigured discolored nails with subungual debris  from hallux to fifth toes bilaterally. No evidence of bacterial infection or drainage bilaterally.  Orthopedic  No limitations of motion  feet .  No crepitus or effusions noted.  No bony pathology or digital deformities noted.  HAV  B/L.  Skin  normotropic skin with no porokeratosis noted bilaterally.  No signs of infections or ulcers noted.   Clavi 3rd toe right foot.  Onychomycosis  Pain in right toes  Pain in left toes  Clavi third toe right foot.  Consent was obtained for treatment procedures.   Mechanical debridement of nails 1-5  bilaterally performed with a nail nipper.  Filed with dremel without incident.  Debride clavi with dremel.    Return office visit  12 weeks               Told patient to return for periodic foot care and evaluation due to potential at risk complications.   Helane Gunther DPM

## 2023-01-21 ENCOUNTER — Telehealth: Payer: Self-pay | Admitting: Emergency Medicine

## 2023-01-21 NOTE — Telephone Encounter (Signed)
Wife called back and would like you to call her tomorrow because she has several appointments today.  Patient's wife would prefer morning call.

## 2023-01-21 NOTE — Telephone Encounter (Signed)
Patient's wife Archie Balboa is concerned about the patient possibly having dementia. She wanted to know what to do. Archie Balboa would like a call back at [phone].

## 2023-01-25 NOTE — Telephone Encounter (Signed)
Called patient back he did have any concerns. Tried calling wife and was not able to reach her

## 2023-01-26 ENCOUNTER — Other Ambulatory Visit: Payer: Self-pay | Admitting: Physician Assistant

## 2023-01-27 ENCOUNTER — Other Ambulatory Visit: Payer: Self-pay | Admitting: Radiology

## 2023-01-27 ENCOUNTER — Telehealth: Payer: Self-pay | Admitting: Emergency Medicine

## 2023-01-27 MED ORDER — PANTOPRAZOLE SODIUM 40 MG PO TBEC: 40.0000 mg | DELAYED_RELEASE_TABLET | Freq: Every day | ORAL | 0 refills | Status: DC

## 2023-01-27 NOTE — Telephone Encounter (Signed)
Patient spouse stopped by needing a med refill for pantoprazole (PROTONIX) 40 MG tablet   LOV 01/14/23  Please send to [facility] on [address] .

## 2023-01-27 NOTE — Telephone Encounter (Signed)
Okay to refill.  Thanks.

## 2023-02-12 ENCOUNTER — Telehealth: Payer: Self-pay | Admitting: Physician Assistant

## 2023-02-12 NOTE — Telephone Encounter (Signed)
Pt's wife came by the office requesting for a RF on Allopurinol 100mg  to be sent to the pharmacy.  Thanks!  Rossana.

## 2023-02-15 ENCOUNTER — Other Ambulatory Visit: Payer: Self-pay | Admitting: Physician Assistant

## 2023-02-16 ENCOUNTER — Telehealth: Payer: Self-pay | Admitting: Emergency Medicine

## 2023-02-16 NOTE — Telephone Encounter (Signed)
Error

## 2023-02-17 ENCOUNTER — Ambulatory Visit: Payer: Medicare Other | Admitting: Emergency Medicine

## 2023-02-17 NOTE — Telephone Encounter (Signed)
Patient's wife states they still need the refill of allopurinol (ZYLOPRIM) 100 MG tablet, for patient's gout. States it can be sent to the [facility] on file. She also would like a call to discuss some concerns at [phone].

## 2023-02-18 ENCOUNTER — Other Ambulatory Visit: Payer: Self-pay | Admitting: Emergency Medicine

## 2023-02-18 MED ORDER — ALLOPURINOL 100 MG PO TABS: 200.0000 mg | ORAL_TABLET | Freq: Every day | ORAL | 0 refills | Status: DC

## 2023-02-18 NOTE — Telephone Encounter (Signed)
Patient's wife called requesting refill: nurse called pt/wife back to ensure pt not actively having any s/s and in need of triaging at present time: no s/s verbalized only in need of refills: verified pharmacy on chart and pt is completely out of medicine at present time.

## 2023-02-18 NOTE — Telephone Encounter (Signed)
Copied from CRM [id number]. Topic: Clinical - Medication Refill >> Feb 18, 2023  8:12 AM Kathryne Eriksson wrote: Most Recent Primary Care Visit:  Provider: Georgina Quint  Department: [facility]  Visit Type: OFFICE VISIT  Date: 01/14/2023  Medication: allopurinol (ZYLOPRIM) 100 MG tablet   Has the patient contacted their pharmacy? Yes (Agent: If no, request that the patient contact the pharmacy for the refill. If patient does not wish to contact the pharmacy document the reason why and proceed with request.) (Agent: If yes, when and what did the pharmacy advise?)  Is this the correct pharmacy for this prescription? Yes If no, delete pharmacy and type the correct one.  This is the patient's preferred pharmacy:  [facility] [address] ([address]), [place] - [address] [place] ([place]) [place] [address] Phone: [phone] Fax: [phone]   Has the prescription been filled recently? Yes  Is the patient out of the medication? Yes  Has the patient been seen for an appointment in the last year OR does the patient have an upcoming appointment? Yes  Can we respond through MyChart? Yes  Agent: Please be advised that Rx refills may take up to 3 business days. We ask that you follow-up with your pharmacy.

## 2023-02-23 ENCOUNTER — Ambulatory Visit: Payer: Medicare Other

## 2023-02-23 VITALS — Ht 74.0 in | Wt 230.0 lb

## 2023-02-23 DIAGNOSIS — Z0000 Encounter for general adult medical examination without abnormal findings: Secondary | ICD-10-CM

## 2023-02-23 NOTE — Patient Instructions (Signed)
[name] , Thank you for taking time to come for your Medicare Wellness Visit. I appreciate your ongoing commitment to your health goals. Please review the following plan we discussed and let me know if I can assist you in the future.   Referrals/Orders/Follow-Ups/Clinician Recommendations: You are due for Pneumonia vaccine and can get this at your pharmacy.  It was nice talking to you today and have a Merry Christmas.  This is a list of the screening recommended for you and due dates:  Health Maintenance  Topic Date Due   COVID-19 Vaccine (5 - 2024-25 season) 03/11/2023*   Zoster (Shingles) Vaccine (1 of 2) 04/16/2023*   Pneumonia Vaccine (1 of 2 - PCV) 01/14/2024*   Medicare Annual Wellness Visit  02/23/2024   Flu Shot  Completed   HPV Vaccine  Aged Out   DTaP/Tdap/Td vaccine  Discontinued  *Topic was postponed. The date shown is not the original due date.    Advanced directives: (Copy Requested) Please bring a copy of your health care power of attorney and living will to the office to be added to your chart at your convenience.  Next Medicare Annual Wellness Visit scheduled for next year: Yes

## 2023-02-23 NOTE — Progress Notes (Signed)
Subjective:   [name] is a 82 y.o. male who presents for an Initial Medicare Annual Wellness Visit.  Visit Complete: Virtual I connected with  [name] on 02/23/23 by a audio enabled telemedicine application and verified that I am speaking with the correct person using two identifiers.  Patient Location: Home  Provider Location: Home Office  I discussed the limitations of evaluation and management by telemedicine. The patient expressed understanding and agreed to proceed.  Vital Signs: Because this visit was a virtual/telehealth visit, some criteria may be missing or patient reported. Any vitals not documented were not able to be obtained and vitals that have been documented are patient reported.   Cardiac Risk Factors include: advanced age (>11men, >33 women);male gender;hypertension;Other (see comment);dyslipidemia, Risk factor comments: CAD, OSA, CKD     Objective:    Today's Vitals   02/23/23 0906  Weight: 230 lb (104.3 kg)  Height: 6\' 2"  (1.88 m)   Body mass index is 29.53 kg/m.     02/23/2023    9:10 AM 02/18/2022    5:41 PM 02/16/2022    4:45 PM 04/23/2020    1:51 PM 02/10/2019   12:22 PM 04/05/2018   11:55 AM 04/05/2018   11:06 AM  Advanced Directives  Does Patient Have a Medical Advance Directive? Yes No No Yes Yes No No  Type of Advance Directive Living will   Living will     Does patient want to make changes to medical advance directive?    No - Patient declined     Would patient like information on creating a medical advance directive?    Yes (MAU/Ambulatory/Procedural Areas - Information given)  Yes (MAU/Ambulatory/Procedural Areas - Information given) Yes (MAU/Ambulatory/Procedural Areas - Information given)    Current Medications (verified) Outpatient Encounter Medications as of 02/23/2023  Medication Sig   acetaminophen (TYLENOL) 325 MG tablet Take 1-2 tablets (325-650 mg total) by mouth every 4 (four) hours as needed for mild pain.    allopurinol (ZYLOPRIM) 100 MG tablet Take 2 tablets (200 mg total) by mouth daily.   amLODipine (NORVASC) 5 MG tablet Take 1 tablet by mouth once daily   apixaban (ELIQUIS) 5 MG TABS tablet Take 1 tablet by mouth twice daily   cyclobenzaprine (FLEXERIL) 5 MG tablet Take 1 tablet (5 mg total) by mouth 3 (three) times daily as needed for muscle spasms.   finasteride (PROSCAR) 5 MG tablet Take 5 mg by mouth daily.   furosemide (LASIX) 20 MG tablet Take 1 tablet (20 mg total) by mouth daily.   lidocaine (LIDODERM) 5 % Place 1 patch onto the skin daily. Remove & Discard patch within 12 hours or as directed by MD   Menthol-Methyl Salicylate (MUSCLE RUB) 10-15 % CREA Apply 1 application topically 4 (four) times daily -  with meals and at bedtime. To neck and shoulders   metoprolol tartrate (LOPRESSOR) 25 MG tablet Take 1/2 (one-half) tablet by mouth twice daily   mirabegron ER (MYRBETRIQ) 25 MG TB24 tablet Take 25 mg by mouth daily.   Multiple Vitamin (MULTIVITAMIN) tablet Take 1 tablet by mouth daily. GNC multi + Omega 3   niacin (NIASPAN) 1000 MG CR tablet    niacin (NIASPAN) 500 MG CR tablet Take 500 mg by mouth at bedtime.   Olmesartan Medoxomil (BENICAR PO)    pantoprazole (PROTONIX) 40 MG tablet Take 1 tablet (40 mg total) by mouth daily.   rosuvastatin (CRESTOR) 10 MG tablet Take 1 tablet (10 mg total)  by mouth at bedtime.   tamsulosin (FLOMAX) 0.4 MG CAPS capsule Take 1 capsule by mouth at bedtime   Vitamin D, Ergocalciferol, (DRISDOL) 1.25 MG (50000 UNIT) CAPS capsule Take 1 capsule (50,000 Units total) by mouth every 7 (seven) days.   No facility-administered encounter medications on file as of 02/23/2023.    Allergies (verified) Ramipril   History: Past Medical History:  Diagnosis Date   CAD (coronary artery disease)    s/p cath in 2005 and CABG x4   Cataract    DJD (degenerative joint disease)    DJD (degenerative joint disease)    Fluid retention    mild   H/O: gout     History of blood clots    HTN (hypertension)    Hyperlipidemia    Hyperlipidemia    Myocardial infarction (HCC) 2009   Obesity    with reduction   OSA (obstructive sleep apnea)    AHI 98/hr   Renal insufficiency    Sleep apnea    Stroke [facility]) 2019   Past Surgical History:  Procedure Laterality Date   CORONARY ARTERY BYPASS GRAFT  2005   LIMA to LAD, SVG to OM1 and OM 2, RCA.    left inguinal hernia repair     LOOP RECORDER INSERTION N/A 01/03/2018   Procedure: LOOP RECORDER INSERTION;  Surgeon: Marinus Maw, MD;  Location: [facility] INVASIVE CV LAB;  Service: Cardiovascular;  Laterality: N/A;   TEE WITHOUT CARDIOVERSION N/A 12/11/2016   Procedure: TRANSESOPHAGEAL ECHOCARDIOGRAM (TEE);  Surgeon: Chrystie Nose, MD;  Location: [facility] ENDOSCOPY;  Service: Cardiovascular;  Laterality: N/A;   TEE WITHOUT CARDIOVERSION N/A 01/03/2018   Procedure: TRANSESOPHAGEAL ECHOCARDIOGRAM (TEE);  Surgeon: Lars Masson, MD;  Location: [facility] ENDOSCOPY;  Service: Cardiovascular;  Laterality: N/A;   Family History  Problem Relation Age of Onset   Gout Mother    Stroke Mother    Coronary artery disease Mother    Hypertension Mother    Arthritis Mother    Coronary artery disease Father    Arthritis Father    Gout Father    Stroke Father    Hypertension Father    Coronary artery disease Brother    Arthritis Brother    Gout Brother    Stroke Brother    Hypertension Brother    Coronary artery disease Brother    Arthritis Brother    Gout Brother    Stroke Brother    Hypertension Brother    Coronary artery disease Other        significant in multiple family members   Social History   Socioeconomic History   Marital status: Married    Spouse name: Steele Sizer   Number of children: 3   Years of education: Not on file   Highest education level: Not on file  Occupational History   Not on file  Tobacco Use   Smoking status: Former    Types: Cigars   Smokeless tobacco: Never   Tobacco comments:     used to smoke cigars, 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, none since  Vaping Use   Vaping status: Never Used  Substance and Sexual Activity   Alcohol use: No   Drug use: No   Sexual activity: Not on file  Other Topics Concern   Not on file  Social History Narrative   Lives with with and one son   Social Drivers of Health   Financial Resource Strain: Low Risk  (02/23/2023)   Overall Financial Resource  Strain (CARDIA)    Difficulty of Paying Living Expenses: Not hard at all  Food Insecurity: No Food Insecurity (02/23/2023)   Hunger Vital Sign    Worried About Running Out of Food in the Last Year: Never true    Ran Out of Food in the Last Year: Never true  Transportation Needs: No Transportation Needs (02/23/2023)   PRAPARE - Administrator, Civil Service (Medical): No    Lack of Transportation (Non-Medical): No  Physical Activity: Inactive (02/23/2023)   Exercise Vital Sign    Days of Exercise per Week: 0 days    Minutes of Exercise per Session: 0 min  Stress: No Stress Concern Present (02/23/2023)   [employer] of Occupational Health - Occupational Stress Questionnaire    Feeling of Stress : Not at all  Social Connections: Socially Integrated (02/23/2023)   Social Connection and Isolation Panel [NHANES]    Frequency of Communication with Friends and Family: More than three times a week    Frequency of Social Gatherings with Friends and Family: Twice a week    Attends Religious Services: More than 4 times per year    Active Member of [employer] or Organizations: Yes    Attends Banker Meetings: Never    Marital Status: Married    Tobacco Counseling Counseling given: Not Answered Tobacco comments: used to smoke cigars, 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, none since   Clinical Intake:  Pre-visit preparation completed: Yes  Pain : No/denies pain     BMI - recorded: 29.53 Nutritional Status: BMI 25 -29 Overweight Nutritional Risks: None Diabetes:  No  How often do you need to have someone help you when you read instructions, pamphlets, or other written materials from your doctor or pharmacy?: 1 - Never  Interpreter Needed?: No  Information entered by :: Lakara Weiland, RMA   Activities of Daily Living    02/23/2023    9:08 AM  In your present state of health, do you have any difficulty performing the following activities:  Hearing? 0  Vision? 0  Difficulty concentrating or making decisions? 0  Walking or climbing stairs? 0  Dressing or bathing? 0  Doing errands, shopping? 0  Preparing Food and eating ? N  Using the Toilet? N  In the past six months, have you accidently leaked urine? N  Do you have problems with loss of bowel control? N  Managing your Medications? N  Managing your Finances? N  Housekeeping or managing your Housekeeping? N    Patient Care Team: Georgina Quint, MD as PCP - General (Internal Medicine) Rennis Golden Lisette Abu, MD as PCP - Cardiology (Cardiology) Erroll Luna, [facility] (Inactive) as Pharmacist (Pharmacist)  Indicate any recent Medical Services you may have received from other than [facility] providers in the past year (date may be approximate).     Assessment:   This is a routine wellness examination for Bowen.  Hearing/Vision screen Hearing Screening - Comments:: Denies hearing difficulties   Vision Screening - Comments:: Wears eyeglasses   Goals Addressed   None   Depression Screen    02/23/2023    9:15 AM 01/14/2023    1:03 PM 07/14/2022   10:23 AM 06/19/2021    1:17 PM 04/16/2020   10:05 AM 06/06/2018    2:12 PM 05/02/2018    1:00 PM  PHQ 2/9 Scores  PHQ - 2 Score 0 0 0 0 2 0 0  PHQ- 9 Score 0    9  Fall Risk    02/23/2023    9:11 AM 01/14/2023    1:03 PM 07/14/2022   10:23 AM 02/25/2022    9:30 AM 10/18/2020    2:07 PM  Fall Risk   Falls in the past year? 0 0 0 0 0  Number falls in past yr: 0 0 0 0   Injury with Fall? 0 0 0 0   Risk for fall due to : No Fall Risks  No Fall Risks No Fall Risks No Fall Risks   Follow up Falls prevention discussed;Falls evaluation completed Falls evaluation completed Falls evaluation completed Falls evaluation completed     MEDICARE RISK AT HOME: Medicare Risk at Home Any stairs in or around the home?: Yes If so, are there any without handrails?: Yes Home free of loose throw rugs in walkways, pet beds, electrical cords, etc?: Yes Adequate lighting in your home to reduce risk of falls?: Yes Life alert?: No Use of a cane, walker or w/c?: No Grab bars in the bathroom?: Yes Shower chair or bench in shower?: Yes Elevated toilet seat or a handicapped toilet?: Yes  TIMED UP AND GO:  Was the test performed? No    Cognitive Function:        02/23/2023    9:11 AM  6CIT Screen  What Year? 0 points  What month? 0 points  What time? 0 points  Count back from 20 0 points  Months in reverse 0 points  Repeat phrase 0 points  Total Score 0 points    Immunizations Immunization History  Administered Date(s) Administered   Fluad Quad(high Dose 65+) 01/07/2021   Fluad Trivalent(High Dose 65+) 01/14/2023   Influenza,inj,Quad PF,6+ Mos 01/13/2017   PFIZER(Purple Top)SARS-COV-2 Vaccination 04/07/2019, 04/28/2019, 12/20/2019   Pfizer Covid-19 Vaccine Bivalent Booster 65yrs & up 06/27/2020   Tdap 08/04/2022    TDAP status: Up to date  Flu Vaccine status: Up to date  Pneumococcal vaccine status: Due, Education has been provided regarding the importance of this vaccine. Advised may receive this vaccine at local pharmacy or Health Dept. Aware to provide a copy of the vaccination record if obtained from local pharmacy or Health Dept. Verbalized acceptance and understanding.  Covid-19 vaccine status: Declined, Education has been provided regarding the importance of this vaccine but patient still declined. Advised may receive this vaccine at local pharmacy or Health Dept.or vaccine clinic. Aware to provide a copy of the  vaccination record if obtained from local pharmacy or Health Dept. Verbalized acceptance and understanding.  Qualifies for Shingles Vaccine? Yes   Zostavax completed  Declines   Shingrix Completed?: No.    Education has been provided regarding the importance of this vaccine. Patient has been advised to call insurance company to determine out of pocket expense if they have not yet received this vaccine. Advised may also receive vaccine at local pharmacy or Health Dept. Verbalized acceptance and understanding.  Screening Tests Health Maintenance  Topic Date Due   COVID-19 Vaccine (5 - 2024-25 season) 03/11/2023 (Originally 11/01/2022)   Zoster Vaccines- Shingrix (1 of 2) 04/16/2023 (Originally [birth year]/1992)   Pneumonia Vaccine 10+ Years old (1 of 2 - PCV) 01/14/2024 (Originally 06/05/1946)   Medicare Annual Wellness (AWV)  02/23/2024   INFLUENZA VACCINE  Completed   HPV VACCINES  Aged Out   DTaP/Tdap/Td  Discontinued    Health Maintenance  There are no preventive care reminders to display for this patient.   Colorectal cancer screening: No longer required.   Lung Cancer Screening: (  Low Dose CT Chest recommended if Age 75-80 years, 20 pack-year currently smoking OR have quit w/in 15years.) does not qualify.   Lung Cancer Screening Referral: N/A  Additional Screening:  Hepatitis C Screening: does not qualify;  Vision Screening: Recommended annual ophthalmology exams for early detection of glaucoma and other disorders of the eye. Is the patient up to date with their annual eye exam?  Yes  Who is the provider or what is the name of the office in which the patient attends annual eye exams? [facility]  If pt is not established with a provider, would they like to be referred to a provider to establish care? No .   Dental Screening: Recommended annual dental exams for proper oral hygiene   Community Resource Referral / Chronic Care Management: CRR required this visit?  No   CCM  required this visit?  No    Plan:     I have personally reviewed and noted the following in the patient's chart:   Medical and social history Use of alcohol, tobacco or illicit drugs  Current medications and supplements including opioid prescriptions. Patient is not currently taking opioid prescriptions. Functional ability and status Nutritional status Physical activity Advanced directives List of other physicians Hospitalizations, surgeries, and ER visits in previous 12 months Vitals Screenings to include cognitive, depression, and falls Referrals and appointments  In addition, I have reviewed and discussed with patient certain preventive protocols, quality metrics, and best practice recommendations. A written personalized care plan for preventive services as well as general preventive health recommendations were provided to patient.     Jowanda Heeg L Conny Situ, CMA   02/23/2023   After Visit Summary: (MyChart) Due to this being a telephonic visit, the after visit summary with patients personalized plan was offered to patient via MyChart   Nurse Notes: Patient is due for a Pneumonia vaccine 65+.  According to [facility], patient has never had one.  He declines the Shingrix vaccine and Covid vaccine.  Patient wanted to schedule a 3 month follow up visit with provider today.  He had no other concerns to address today.

## 2023-03-03 ENCOUNTER — Encounter (HOSPITAL_BASED_OUTPATIENT_CLINIC_OR_DEPARTMENT_OTHER): Payer: Self-pay | Admitting: Emergency Medicine

## 2023-03-03 ENCOUNTER — Other Ambulatory Visit: Payer: Self-pay

## 2023-03-03 DIAGNOSIS — R531 Weakness: Secondary | ICD-10-CM | POA: Insufficient documentation

## 2023-03-03 DIAGNOSIS — I6782 Cerebral ischemia: Secondary | ICD-10-CM | POA: Diagnosis not present

## 2023-03-03 DIAGNOSIS — R42 Dizziness and giddiness: Secondary | ICD-10-CM | POA: Insufficient documentation

## 2023-03-03 DIAGNOSIS — Z5321 Procedure and treatment not carried out due to patient leaving prior to being seen by health care provider: Secondary | ICD-10-CM | POA: Insufficient documentation

## 2023-03-03 DIAGNOSIS — G9389 Other specified disorders of brain: Secondary | ICD-10-CM | POA: Diagnosis not present

## 2023-03-03 DIAGNOSIS — R1110 Vomiting, unspecified: Secondary | ICD-10-CM | POA: Diagnosis not present

## 2023-03-03 LAB — BASIC METABOLIC PANEL
Anion gap: 5 (ref 5–15)
BUN: 27 mg/dL — ABNORMAL HIGH (ref 8–23)
CO2: 29 mmol/L (ref 22–32)
Calcium: 9.9 mg/dL (ref 8.9–10.3)
Chloride: 107 mmol/L (ref 98–111)
Creatinine, Ser: 1.67 mg/dL — ABNORMAL HIGH (ref 0.61–1.24)
GFR, Estimated: 41 mL/min — ABNORMAL LOW (ref >60)
Glucose, Bld: 105 mg/dL — ABNORMAL HIGH (ref 70–99)
Potassium: 4.3 mmol/L (ref 3.5–5.1)
Sodium: 141 mmol/L (ref 135–145)

## 2023-03-03 LAB — CBC
HCT: 38.5 % — ABNORMAL LOW (ref 39.0–52.0)
Hemoglobin: 12.7 g/dL — ABNORMAL LOW (ref 13.0–17.0)
MCH: 29.3 pg (ref 26.0–34.0)
MCHC: 33 g/dL (ref 30.0–36.0)
MCV: 88.7 fL (ref 80.0–100.0)
Platelets: 177 10*3/uL (ref 150–400)
RBC: 4.34 MIL/uL (ref 4.22–5.81)
RDW: 14.4 % (ref 11.5–15.5)
WBC: 5.8 10*3/uL (ref 4.0–10.5)
nRBC: 0 % (ref 0.0–0.2)

## 2023-03-03 LAB — CBG MONITORING, ED: Glucose-Capillary: 76 mg/dL (ref 70–99)

## 2023-03-03 NOTE — ED Triage Notes (Signed)
 Dizzy,weak upon waking yesterday. Pt';s wife states he also has been easily agitated and this is new. Pt states he feels dizzy in triage. Weak, can t walk on his own.

## 2023-03-04 ENCOUNTER — Emergency Department (HOSPITAL_BASED_OUTPATIENT_CLINIC_OR_DEPARTMENT_OTHER)
Admission: EM | Admit: 2023-03-04 | Discharge: 2023-03-04 | Discharge status: Against Medical Advice | Payer: Medicare Other | Attending: Emergency Medicine | Admitting: Emergency Medicine

## 2023-03-04 ENCOUNTER — Emergency Department (HOSPITAL_COMMUNITY): Payer: Medicare Other

## 2023-03-04 ENCOUNTER — Encounter (HOSPITAL_COMMUNITY): Payer: Self-pay

## 2023-03-04 ENCOUNTER — Ambulatory Visit: Payer: Self-pay | Admitting: Emergency Medicine

## 2023-03-04 ENCOUNTER — Emergency Department (HOSPITAL_COMMUNITY)
Admission: EM | Admit: 2023-03-04 | Discharge: 2023-03-04 | Discharge status: Against Medical Advice | Payer: Medicare Other | Source: Home / Self Care

## 2023-03-04 DIAGNOSIS — R1110 Vomiting, unspecified: Secondary | ICD-10-CM | POA: Insufficient documentation

## 2023-03-04 DIAGNOSIS — G9389 Other specified disorders of brain: Secondary | ICD-10-CM | POA: Diagnosis not present

## 2023-03-04 DIAGNOSIS — I6782 Cerebral ischemia: Secondary | ICD-10-CM | POA: Diagnosis not present

## 2023-03-04 DIAGNOSIS — Z5321 Procedure and treatment not carried out due to patient leaving prior to being seen by health care provider: Secondary | ICD-10-CM | POA: Insufficient documentation

## 2023-03-04 DIAGNOSIS — R42 Dizziness and giddiness: Secondary | ICD-10-CM | POA: Insufficient documentation

## 2023-03-04 LAB — CBC WITH DIFFERENTIAL/PLATELET
Abs Immature Granulocytes: 0.03 10*3/uL (ref 0.00–0.07)
Basophils Absolute: 0 10*3/uL (ref 0.0–0.1)
Basophils Relative: 1 %
Eosinophils Absolute: 0.4 10*3/uL (ref 0.0–0.5)
Eosinophils Relative: 5 %
HCT: 40.3 % (ref 39.0–52.0)
Hemoglobin: 12.7 g/dL — ABNORMAL LOW (ref 13.0–17.0)
Immature Granulocytes: 0 %
Lymphocytes Relative: 28 %
Lymphs Abs: 2.1 10*3/uL (ref 0.7–4.0)
MCH: 29.3 pg (ref 26.0–34.0)
MCHC: 31.5 g/dL (ref 30.0–36.0)
MCV: 92.9 fL (ref 80.0–100.0)
Monocytes Absolute: 0.7 10*3/uL (ref 0.1–1.0)
Monocytes Relative: 9 %
Neutro Abs: 4.3 10*3/uL (ref 1.7–7.7)
Neutrophils Relative %: 57 %
Platelets: 188 10*3/uL (ref 150–400)
RBC: 4.34 MIL/uL (ref 4.22–5.81)
RDW: 14.6 % (ref 11.5–15.5)
WBC: 7.5 10*3/uL (ref 4.0–10.5)
nRBC: 0 % (ref 0.0–0.2)

## 2023-03-04 LAB — COMPREHENSIVE METABOLIC PANEL
ALT: 12 U/L (ref 0–44)
AST: 17 U/L (ref 15–41)
Albumin: 3.7 g/dL (ref 3.5–5.0)
Alkaline Phosphatase: 48 U/L (ref 38–126)
Anion gap: 3 — ABNORMAL LOW (ref 5–15)
BUN: 35 mg/dL — ABNORMAL HIGH (ref 8–23)
CO2: 27 mmol/L (ref 22–32)
Calcium: 9.9 mg/dL (ref 8.9–10.3)
Chloride: 109 mmol/L (ref 98–111)
Creatinine, Ser: 1.74 mg/dL — ABNORMAL HIGH (ref 0.61–1.24)
GFR, Estimated: 39 mL/min — ABNORMAL LOW (ref >60)
Glucose, Bld: 87 mg/dL (ref 70–99)
Potassium: 4.2 mmol/L (ref 3.5–5.1)
Sodium: 139 mmol/L (ref 135–145)
Total Bilirubin: 0.3 mg/dL (ref 0.0–1.2)
Total Protein: 7.4 g/dL (ref 6.5–8.1)

## 2023-03-04 LAB — URINALYSIS, W/ REFLEX TO CULTURE (INFECTION SUSPECTED)
Bacteria, UA: NONE SEEN
Bilirubin Urine: NEGATIVE
Glucose, UA: NEGATIVE mg/dL
Hgb urine dipstick: NEGATIVE
Ketones, ur: NEGATIVE mg/dL
Leukocytes,Ua: NEGATIVE
Nitrite: NEGATIVE
Protein, ur: NEGATIVE mg/dL
Specific Gravity, Urine: 1.013 (ref 1.005–1.030)
pH: 5 (ref 5.0–8.0)

## 2023-03-04 NOTE — ED Triage Notes (Signed)
 Pt arrives via POV. Pt reports intermittent dizziness and hot flashes for the past 2 days. Pt AxOx4. NAD. Pt reports he did have one episode of vomiting. He did go to [facility] but had to leave prior to seeing the provider.

## 2023-03-04 NOTE — ED Provider Triage Note (Signed)
 Emergency Medicine Provider Triage Evaluation Note  [name] , a 83 y.o. male  was evaluated in triage.  Pt complains of vertigo.  Review of Systems  Positive:  Negative:   Physical Exam  BP (!) 142/81 (BP Location: Left Arm)   Pulse 62   Temp 98 F (36.7 C) (Oral)   Resp 18   Ht 6' 2 (1.88 m)   Wt 108.9 kg   SpO2 100%   BMI 30.81 kg/m  Gen:   Awake, no distress   Resp:  Normal effort  MSK:   Moves extremities without difficulty  Other:    Medical Decision Making  Medically screening exam initiated at 12:43 PM.  Appropriate orders placed.  [name] was informed that the remainder of the evaluation will be completed by another provider, this initial triage assessment does not replace that evaluation, and the importance of remaining in the ED until their evaluation is complete.  Patient had a hot flash 2 nights ago and vomited during it - then got dizzy. These symptoms have been intermittent over the past 2 days. Denies head trauma, LOC, blood thinners. Patient denies symptoms right now but states that he is just swimmy headed   Hoy Nidia FALCON, [place] 03/04/23 1245

## 2023-03-04 NOTE — Telephone Encounter (Signed)
  Chief Complaint: Dizziness Symptoms: lightheadedness, dizziness and vomiting Frequency: December 31,2024 Pertinent Negatives: Patient denies fever, chest pain Disposition: [x] ED /[] Urgent Care (no appt availability in office) / [] Appointment(In office/virtual)/ []  [facility]/ [] Home Care/ [] Refused Recommended Disposition /[] [facility]/ []  Follow-up with PCP Additional Notes: patient's wife called for patient who is next to her. Patient developed dizziness on New Years Eve along with vomiting and hot flashes. Patient states that the room is spinning and that he feels very dizzy. States the dizziness came on suddenly.  Wife states that patient is unable to walk without any assistance of another adult along with his cane. Per protocol, recommendation of the emergency department is given. All questions answered. Patient and wife verbalize understanding of plan and are going to the ED.    Copied from CRM [id number]. Topic: Clinical - Pink Word Triage >> Mar 04, 2023  9:52 AM Joanell B wrote: Reason for Triage: Pt wife Chrystie stated that the pt has been dealing with dizziness for two days, hot flashes and throwing up. Reason for Disposition  SEVERE dizziness (e.g., unable to stand, requires support to walk, feels like passing out now)  Answer Assessment - Initial Assessment Questions 1. DESCRIPTION: Describe your dizziness.     Feels like the room is spinning 2. LIGHTHEADED: Do you feel lightheaded? (e.g., somewhat faint, woozy, weak upon standing)     Felt woozy, weak upon standing 3. VERTIGO: Do you feel like either you or the room is spinning or tilting? (i.e. vertigo)     Feels like the room is spinning 4. SEVERITY: How bad is it?  Do you feel like you are going to faint? Can you stand and walk?   - MILD: Feels slightly dizzy, but walking normally.   - MODERATE: Feels unsteady when walking, but not falling; interferes with normal activities (e.g., school,  work).   - SEVERE: Unable to walk without falling, or requires assistance to walk without falling; feels like passing out now.      Severe 5. ONSET:  When did the dizziness begin?     New Years Eve 03/02/2023 6. AGGRAVATING FACTORS: Does anything make it worse? (e.g., standing, change in head position)     Patient states he can feel it coming on. 7. HEART RATE: Can you tell me your heart rate? How many beats in 15 seconds?  (Note: not all patients can do this)       Unable to tell HR 8. CAUSE: What do you think is causing the dizziness?     Unsure 9. RECURRENT SYMPTOM: Have you had dizziness before? If Yes, ask: When was the last time? What happened that time?     No 10. OTHER SYMPTOMS: Do you have any other symptoms? (e.g., fever, chest pain, vomiting, diarrhea, bleeding)       Vomiting  Protocols used: Dizziness - Lightheadedness-A-AH

## 2023-03-09 ENCOUNTER — Encounter: Payer: Self-pay | Admitting: Family Medicine

## 2023-03-09 ENCOUNTER — Ambulatory Visit (INDEPENDENT_AMBULATORY_CARE_PROVIDER_SITE_OTHER): Payer: Medicare Other | Admitting: Family Medicine

## 2023-03-09 VITALS — BP 120/70 | HR 54 | Temp 98.3°F | Ht 74.0 in | Wt 224.0 lb

## 2023-03-09 DIAGNOSIS — R110 Nausea: Secondary | ICD-10-CM

## 2023-03-09 DIAGNOSIS — Z7901 Long term (current) use of anticoagulants: Secondary | ICD-10-CM

## 2023-03-09 DIAGNOSIS — Z8679 Personal history of other diseases of the circulatory system: Secondary | ICD-10-CM

## 2023-03-09 DIAGNOSIS — R232 Flushing: Secondary | ICD-10-CM | POA: Diagnosis not present

## 2023-03-09 DIAGNOSIS — R42 Dizziness and giddiness: Secondary | ICD-10-CM | POA: Diagnosis not present

## 2023-03-09 LAB — COMPREHENSIVE METABOLIC PANEL
ALT: 7 U/L (ref 0–53)
AST: 16 U/L (ref 0–37)
Albumin: 3.8 g/dL (ref 3.5–5.2)
Alkaline Phosphatase: 53 U/L (ref 39–117)
BUN: 15 mg/dL (ref 6–23)
CO2: 30 meq/L (ref 19–32)
Calcium: 10 mg/dL (ref 8.4–10.5)
Chloride: 108 meq/L (ref 96–112)
Creatinine, Ser: 1.31 mg/dL (ref 0.40–1.50)
GFR: 51.32 mL/min — ABNORMAL LOW (ref >60.00)
Glucose, Bld: 87 mg/dL (ref 70–99)
Potassium: 4.3 meq/L (ref 3.5–5.1)
Sodium: 143 meq/L (ref 135–145)
Total Bilirubin: 0.5 mg/dL (ref 0.2–1.2)
Total Protein: 7.1 g/dL (ref 6.0–8.3)

## 2023-03-09 LAB — CBC WITH DIFFERENTIAL/PLATELET
Basophils Absolute: 0 10*3/uL (ref 0.0–0.1)
Basophils Relative: 0.6 % (ref 0.0–3.0)
Eosinophils Absolute: 0.4 10*3/uL (ref 0.0–0.7)
Eosinophils Relative: 5.9 % — ABNORMAL HIGH (ref 0.0–5.0)
HCT: 40.9 % (ref 39.0–52.0)
Hemoglobin: 13.3 g/dL (ref 13.0–17.0)
Lymphocytes Relative: 32.4 % (ref 12.0–46.0)
Lymphs Abs: 2.1 10*3/uL (ref 0.7–4.0)
MCHC: 32.6 g/dL (ref 30.0–36.0)
MCV: 91.8 fL (ref 78.0–100.0)
Monocytes Absolute: 0.6 10*3/uL (ref 0.1–1.0)
Monocytes Relative: 8.9 % (ref 3.0–12.0)
Neutro Abs: 3.4 10*3/uL (ref 1.4–7.7)
Neutrophils Relative %: 52.2 % (ref 43.0–77.0)
Platelets: 203 10*3/uL (ref 150.0–400.0)
RBC: 4.45 Mil/uL (ref 4.22–5.81)
RDW: 15.3 % (ref 11.5–15.5)
WBC: 6.5 10*3/uL (ref 4.0–10.5)

## 2023-03-09 LAB — TROPONIN I (HIGH SENSITIVITY): High Sens Troponin I: 17 ng/L (ref 2–17)

## 2023-03-09 LAB — BRAIN NATRIURETIC PEPTIDE: Pro B Natriuretic peptide (BNP): 87 pg/mL (ref 0.0–100.0)

## 2023-03-09 NOTE — Progress Notes (Signed)
 Acute Office Visit  Subjective:     Patient ID: [name], male    DOB: 1940-07-30, 83 y.o.   MRN: [id number]  Chief Complaint  Patient presents with   Dizziness    Notes of dizziness for the past 2 weeks. Worst episode was this past Sunday, noted the room felt like it was spinning. Past history of blood pressure issues. Worst episodes also seem to prompt when laying down    HPI Patient is in today for evaluation of episodes of dizziness, hot flash, vomiting, weakness.  States this happened twice.  Reports that after the episodes, he is very fatigued and very weak. Has history of CAD, HTN, CKD, CVA, DVT, anemia, history of a flutter and is on Eliquis . Went to the ER at [facility] on 03/04/2023 and left before seeing provider. Went to the ER at [facility] later that night, also left without seeing provider.  At [facility] they did do a head CT that was negative for anything acute, did show chronic small vessel ischemic disease. EKGs from both visits were very similar, bradycardia with first-degree AV block, not significantly changed from previous EKGs. ER notes, labs, EKGs, imaging reports reviewed by me. Denies previous symptoms. Denies feeling dizzy with position changes, states that the nausea is worse when he lies down.  States he lies down to try to alleviate symptoms. Denies headaches, numbness, tingling, loss of consciousness, falls, shortness of breath, chest pain, vision changes, other symptoms.  ROS Per HPI      Objective:    BP 120/70   Pulse (!) 54   Temp 98.3 F (36.8 C)   Ht 6' 2 (1.88 m)   Wt 224 lb (101.6 kg)   SpO2 99%   BMI 28.76 kg/m    Physical Exam Vitals and nursing note reviewed.  Constitutional:      General: He is not in acute distress.    Appearance: Normal appearance.  HENT:     Head: Normocephalic and atraumatic.  Eyes:     Extraocular Movements: Extraocular movements intact.  Cardiovascular:     Rate and Rhythm: Regular rhythm.  Bradycardia present.     Pulses: Normal pulses.     Heart sounds: Normal heart sounds.  Pulmonary:     Effort: Pulmonary effort is normal.     Breath sounds: Normal breath sounds.  Musculoskeletal:        General: Normal range of motion.     Cervical back: Normal range of motion.  Skin:    General: Skin is warm and dry.  Neurological:     General: No focal deficit present.     Mental Status: He is alert and oriented to person, place, and time.  Psychiatric:        Mood and Affect: Mood normal.        Behavior: Behavior normal.    No results found for any visits on 03/09/23.      Assessment & Plan:  1. Dizziness (Primary)  - CBC with Differential/Platelet - Comprehensive metabolic panel - Troponin I (High Sensitivity); Future - Brain natriuretic peptide - Troponin I (High Sensitivity)  2. Nausea  - CBC with Differential/Platelet - Comprehensive metabolic panel - Troponin I (High Sensitivity); Future - Brain natriuretic peptide - Troponin I (High Sensitivity)  3. Hot flashes  - CBC with Differential/Platelet - Comprehensive metabolic panel - Troponin I (High Sensitivity); Future - Brain natriuretic peptide - Troponin I (High Sensitivity)  4. Current use of long term  anticoagulation  - CBC with Differential/Platelet - Comprehensive metabolic panel - Troponin I (High Sensitivity); Future - Brain natriuretic peptide - Troponin I (High Sensitivity)  5. History of atrial flutter  - Continue current medication regimen  - Discussed f/u with cardiology ASAP - Old loop recorder in place, no battery life left - Discussed that if another episode occurs, recommend further evaluation and tx in the ER   No orders of the defined types were placed in this encounter.   Return if symptoms worsen or fail to improve.  Corean Ku, FNP

## 2023-03-09 NOTE — Patient Instructions (Signed)
 We are checking labs today, will be in contact with any results that require further attention  I suspect that you may be experiencing arrhythmias intermittently.  He may want to monitor your heart rate and rhythm again.  I will send your note and visit information over to Dr Mona, and I'd like you to call this afternoon or tomorrow to get an appointment with him.   If you have another episode, I do recommend going back to the ER for further evaluation and treatment.

## 2023-03-15 ENCOUNTER — Ambulatory Visit: Payer: Medicare Other | Admitting: Nurse Practitioner

## 2023-03-23 ENCOUNTER — Other Ambulatory Visit: Payer: Self-pay

## 2023-03-25 ENCOUNTER — Telehealth: Payer: Self-pay | Admitting: Internal Medicine

## 2023-03-25 ENCOUNTER — Ambulatory Visit: Payer: Medicare Other | Admitting: Internal Medicine

## 2023-03-25 NOTE — Telephone Encounter (Signed)
Pt's wife came in today

## 2023-04-12 ENCOUNTER — Ambulatory Visit (INDEPENDENT_AMBULATORY_CARE_PROVIDER_SITE_OTHER): Payer: Medicare Other | Admitting: Podiatry

## 2023-04-12 ENCOUNTER — Encounter: Payer: Self-pay | Admitting: Podiatry

## 2023-04-12 DIAGNOSIS — M79675 Pain in left toe(s): Secondary | ICD-10-CM | POA: Diagnosis not present

## 2023-04-12 DIAGNOSIS — B351 Tinea unguium: Secondary | ICD-10-CM

## 2023-04-12 DIAGNOSIS — N182 Chronic kidney disease, stage 2 (mild): Secondary | ICD-10-CM | POA: Diagnosis not present

## 2023-04-12 DIAGNOSIS — M79674 Pain in right toe(s): Secondary | ICD-10-CM | POA: Diagnosis not present

## 2023-04-12 DIAGNOSIS — L84 Corns and callosities: Secondary | ICD-10-CM

## 2023-04-12 NOTE — Progress Notes (Signed)
 This patient returns to my office for at risk foot care.  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  Patient is taking eliquis .  This patient is unable to cut nails himself since the patient cannot reach his nails.These nails are painful walking and wearing shoes.  This patient presents for at risk foot care today.  General Appearance  Alert, conversant and in no acute stress.  Vascular  Dorsalis pedis  are weakly palpable bilaterally.  Posterior tibial pulses are absent due to swelling. Posterior tibial pulses are not palpable.Capillary return is within normal limits  bilaterally. Temperature is within normal limits  bilaterally.  Neurologic  Senn-Weinstein monofilament wire test within normal limits  bilaterally. Muscle power within normal limits bilaterally.  Nails Thick disfigured discolored nails with subungual debris  from hallux to fifth toes bilaterally. No evidence of bacterial infection or drainage bilaterally.  Orthopedic  No limitations of motion  feet .  No crepitus or effusions noted.  No bony pathology or digital deformities noted.  HAV  B/L.  Skin  normotropic skin with no porokeratosis noted bilaterally.  No signs of infections or ulcers noted.   Clavi 3rd toe right foot. Pinch callus  Onychomycosis  Pain in right toes  Pain in left toes  Clavi third toe right foot.  Pinch callus  B/L  Consent was obtained for treatment procedures.   Mechanical debridement of nails 1-5  bilaterally performed with a nail nipper.  Filed with dremel without incident.  Debride clavi  and pinch callus with dremel.    Return office visit  10  weeks               Told patient to return for periodic foot care and evaluation due to potential at risk complications.   Ruffin Cotton DPM

## 2023-04-14 ENCOUNTER — Ambulatory Visit: Payer: Medicare Other | Attending: Internal Medicine

## 2023-04-14 ENCOUNTER — Encounter: Payer: Self-pay | Admitting: Internal Medicine

## 2023-04-14 ENCOUNTER — Ambulatory Visit: Payer: Medicare Other | Attending: Internal Medicine | Admitting: Internal Medicine

## 2023-04-14 VITALS — BP 118/72 | HR 57 | Ht 74.0 in | Wt 226.4 lb

## 2023-04-14 DIAGNOSIS — R001 Bradycardia, unspecified: Secondary | ICD-10-CM | POA: Diagnosis not present

## 2023-04-14 DIAGNOSIS — Z951 Presence of aortocoronary bypass graft: Secondary | ICD-10-CM

## 2023-04-14 DIAGNOSIS — R42 Dizziness and giddiness: Secondary | ICD-10-CM | POA: Diagnosis not present

## 2023-04-14 DIAGNOSIS — R5383 Other fatigue: Secondary | ICD-10-CM | POA: Diagnosis not present

## 2023-04-14 NOTE — Progress Notes (Unsigned)
Enrolled for [employer] to mail a ZIO XT long term holter monitor to the patients address on file.

## 2023-04-14 NOTE — Progress Notes (Signed)
OFFICE NOTE  Chief Complaint:  Dizziness, nausea, hot flashes  Primary Care Physician: Georgina Quint, MD  HPI:  [name] is a 83 y.o. male with a past medial history significant for coronary artery disease status post CABG in 2005 (LIMA to LAD, SVG to first OM and SVG to second OM and SVG to RCA (, hypertension, dyslipidemia and gout.  He was last seen in July by Lorin Picket, DNP, which time he had a stress test which was negative for ischemia.  I had met him in October 2018 at the time when he was hospitalized for bacteremia and had a TEE then which was negative for vegetation.  Recently he unfortunately had a stroke in November and had a repeat TEE which was unremarkable.  Subsequently he had an implanted loop recorder placed by Dr. Ladona Ridgel.  This did demonstrate 2-1 atrial flutter with 10 episodes in January and he ultimately has been placed on Eliquis.  He seems to be tolerating this.  He was unaware of any A. fib.  He continues in neuro rehab.  11/01/2018  [name] is seen today in follow-up.  Overall he continues to do well.  He denies any chest pain or worsening shortness of breath.  His blood pressure is well controlled today.  Heart rate is low normal.  His only complaint really is some itchiness of his sternal scars which is likely from keloids.  EKG showed sinus bradycardia with some PVCs.  He is asymptomatic denying any palpitations or awareness of his heart rate.  He denies any recurrent atrial fibrillation.  He remains on Eliquis for anticoagulation.  No bleeding issues.  Cholesterol is followed by his primary care provider Dr. August Saucer.  11/03/2018  [name] returns today for annual follow-up.  Overall he is feeling well.  He denies any recurrent stroke or TIA.  He continues to get checks of his loop recorder which still has battery life.  Blood pressure is excellent today 122/74.  He is due for repeat lipids.  He has felt somewhat fatigued and notably his heart rate  was in the 40s today with a sinus bradycardia and first-degree AV block.  05/02/2021  [name] is seen today in follow-up.  He is accompanied by his wife.  He reports some fatigue but overall no complaints.  She notes that he tends to sit around a lot and often times when he is contemplating what to make for dinner she says he would pull a chair up to the refrigerator and sit there to look in there.  He does not think this is an issue.  He denies any chest pain or shortness of breath with exertion.  Blood pressure is well controlled today.  Heart rate remains in the mid 50s.  He continues to have remote checks of his implanted loop recorder which was placed in 2019.  That does not demonstrate any recurrent atrial flutter.  He is anticoagulated for this.  06/18/2022  [name] returns today for follow-up.  Overall he seems to be doing well.  Back in December he had an episode of some pain in his neck and arm.  It was felt that this could be related to his stroke and he did have an MRI which showed no acute changes.  Ultimately that resolved.  He has had no further chest pain.  Blood pressure is well-controlled.  His implanted loop recorder is run out of battery life.  His wife had expressed a concern  about possibly having it explanted however the patient is not bothered by it.  EKG today shows a sinus bradycardia with first-degree AV block.  04/14/2023  [name] is seen today in follow-up.  He recently was seen in the emergency department the beginning of January with symptoms of dizziness, nausea and hot flashes.  He had an severe episode at night and a less severe episode that occurred a day or so later.  He was seen by his primary care provider after being seen in the emergency department.  Workup was generally negative at either spot.  There was concern about possible arrhythmia and he was referred back to cardiology.  He does have a history of implanted loop recorder for stroke however that battery  life has run out.  He is EKG from the emergency department shows sinus bradycardia which I personally reviewed today.  Heart rate was in the 40s and he is on low-dose metoprolol.  He also has a history of atrial flutter but he has not clearly described any palpitations.  Since he is couple episodes he says he has been doing pretty well.  PMHx:  Past Medical History:  Diagnosis Date   CAD (coronary artery disease)    s/p cath in 2005 and CABG x4   Cataract    DJD (degenerative joint disease)    DJD (degenerative joint disease)    Fluid retention    mild   H/O: gout    History of blood clots    HTN (hypertension)    Hyperlipidemia    Hyperlipidemia    Myocardial infarction (HCC) 2009   Obesity    with reduction   OSA (obstructive sleep apnea)    AHI 98/hr   Renal insufficiency    Sleep apnea    Stroke [facility]) 2019    Past Surgical History:  Procedure Laterality Date   CORONARY ARTERY BYPASS GRAFT  2005   LIMA to LAD, SVG to OM1 and OM 2, RCA.    left inguinal hernia repair     LOOP RECORDER INSERTION N/A 01/03/2018   Procedure: LOOP RECORDER INSERTION;  Surgeon: Marinus Maw, MD;  Location: [facility] INVASIVE CV LAB;  Service: Cardiovascular;  Laterality: N/A;   TEE WITHOUT CARDIOVERSION N/A 12/11/2016   Procedure: TRANSESOPHAGEAL ECHOCARDIOGRAM (TEE);  Surgeon: Chrystie Nose, MD;  Location: [facility] ENDOSCOPY;  Service: Cardiovascular;  Laterality: N/A;   TEE WITHOUT CARDIOVERSION N/A 01/03/2018   Procedure: TRANSESOPHAGEAL ECHOCARDIOGRAM (TEE);  Surgeon: Lars Masson, MD;  Location: [facility] ENDOSCOPY;  Service: Cardiovascular;  Laterality: N/A;    FAMHx:  Family History  Problem Relation Age of Onset   Gout Mother    Stroke Mother    Coronary artery disease Mother    Hypertension Mother    Arthritis Mother    Coronary artery disease Father    Arthritis Father    Gout Father    Stroke Father    Hypertension Father    Coronary artery disease Brother    Arthritis Brother     Gout Brother    Stroke Brother    Hypertension Brother    Coronary artery disease Brother    Arthritis Brother    Gout Brother    Stroke Brother    Hypertension Brother    Coronary artery disease Other        significant in multiple family members    SOCHx:   reports that he has quit smoking. His smoking use included cigars. He has never used smokeless tobacco.  He reports that he does not drink alcohol and does not use drugs.  ALLERGIES:  Allergies  Allergen Reactions   Ramipril Cough    ROS: Pertinent items noted in HPI and remainder of comprehensive ROS otherwise negative.  HOME MEDS: Current Outpatient Medications on File Prior to Visit  Medication Sig Dispense Refill   acetaminophen (TYLENOL) 325 MG tablet Take 1-2 tablets (325-650 mg total) by mouth every 4 (four) hours as needed for mild pain.     allopurinol (ZYLOPRIM) 100 MG tablet Take 2 tablets (200 mg total) by mouth daily. 180 tablet 0   amLODipine (NORVASC) 5 MG tablet Take 1 tablet by mouth once daily 90 tablet 0   apixaban (ELIQUIS) 5 MG TABS tablet Take 1 tablet by mouth twice daily 180 tablet 1   cyclobenzaprine (FLEXERIL) 5 MG tablet Take 1 tablet (5 mg total) by mouth 3 (three) times daily as needed for muscle spasms. 30 tablet 0   finasteride (PROSCAR) 5 MG tablet Take 5 mg by mouth daily.     furosemide (LASIX) 20 MG tablet Take 1 tablet (20 mg total) by mouth daily. 90 tablet 2   lidocaine (LIDODERM) 5 % Place 1 patch onto the skin daily. Remove & Discard patch within 12 hours or as directed by MD 30 patch 0   metoprolol tartrate (LOPRESSOR) 25 MG tablet Take 1/2 (one-half) tablet by mouth twice daily 90 tablet 1   mirabegron ER (MYRBETRIQ) 25 MG TB24 tablet Take 25 mg by mouth daily.     Multiple Vitamin (MULTIVITAMIN) tablet Take 1 tablet by mouth daily. GNC multi + Omega 3     niacin (NIASPAN) 1000 MG CR tablet      niacin (NIASPAN) 500 MG CR tablet Take 500 mg by mouth at bedtime.     Olmesartan  Medoxomil (BENICAR PO)      pantoprazole (PROTONIX) 40 MG tablet Take 1 tablet (40 mg total) by mouth daily. 90 tablet 0   rosuvastatin (CRESTOR) 10 MG tablet Take 1 tablet (10 mg total) by mouth at bedtime. 90 tablet 3   tamsulosin (FLOMAX) 0.4 MG CAPS capsule Take 1 capsule by mouth at bedtime 90 capsule 0   Vitamin D, Ergocalciferol, (DRISDOL) 1.25 MG (50000 UNIT) CAPS capsule Take 1 capsule (50,000 Units total) by mouth every 7 (seven) days. 12 capsule 0   Menthol-Methyl Salicylate (MUSCLE RUB) 10-15 % CREA Apply 1 application topically 4 (four) times daily -  with meals and at bedtime. To neck and shoulders (Patient not taking: Reported on 04/14/2023)  0   No current facility-administered medications on file prior to visit.    LABS/IMAGING: No results found for this or any previous visit (from the past 48 hours). No results found.  LIPID PANEL:    Component Value Date/Time   CHOL 175 07/14/2022 1106   CHOL 186 09/07/2017 1617   TRIG 82.0 07/14/2022 1106   HDL 49.20 07/14/2022 1106   HDL 39 (L) 09/07/2017 1617   CHOLHDL 4 07/14/2022 1106   VLDL 16.4 07/14/2022 1106   LDLCALC 110 (H) 07/14/2022 1106   LDLCALC 126 (H) 09/07/2017 1617     WEIGHTS: Wt Readings from Last 3 Encounters:  04/14/23 226 lb 6.4 oz (102.7 kg)  03/09/23 224 lb (101.6 kg)  03/04/23 240 lb (108.9 kg)    VITALS: BP 118/72 (BP Location: Left Arm, Patient Position: Sitting)   Pulse (!) 57   Ht 6\' 2"  (1.88 m)   Wt 226 lb 6.4 oz (102.7  kg)   SpO2 99%   BMI 29.07 kg/m   EXAM: General appearance: alert and no distress Neck: no carotid bruit, no JVD, and thyroid not enlarged, symmetric, no tenderness/mass/nodules Lungs: clear to auscultation bilaterally Heart: regular rate and rhythm Abdomen: soft, non-tender; bowel sounds normal; no masses,  no organomegaly Extremities: extremities normal, atraumatic, no cyanosis or edema Pulses: 2+ and symmetric Skin: Skin color, texture, turgor normal. No rashes or  lesions Neurologic: Grossly normal Psych: Pleasant  EKG: Sinus bradycardia first-degree AV block at 48 (03/08/2023)--personally reviewed  ASSESSMENT: Dizziness, fatigue, nausea, hot flashes CAD status post CABG x4 in 2005 (LIMA to LAD, SVG to OM1 and OM 2, SVG to RCA), low risk Myoview stress test with normal LV function (08/2017) - LVEF 52% Hypertension Dyslipidemia Stroke (12/2017) - s/p ILR Paroxysmal atrial flutter-anticoagulated on Eliquis - CHADVASC score of 5  PLAN: 1.   Mr. Cloe had a recent episode of dizziness, fatigue and nausea and feeling hot overall.  This episode occurred at night and he had another episode afterwards.  In the emergency department was noted to have heart rate in the 40s.  Workup by his PCP including troponin and BNP were negative.  There is concern about possible arrhythmia.  My other concern would be about ischemia.  He does have prior bypass and his last stress test was in 2019.  Will repeat a Lexiscan Myoview to rule out any ischemia.  In addition I will place a 2-week ZIO monitor to see if there is any bradycardia or heart block that might explain these episodes.  Plan follow-up with [place] in about 2 to 3 months.  Chrystie Nose, MD, [facility], FACP  [facility]  Medical Director of the Advanced Lipid Disorders &  Cardiovascular Risk Reduction Clinic Diplomate of the American Board of Clinical Lipidology Attending Cardiologist  Direct Dial: [phone]  Fax: [phone]  Website:  [email or web address] Nicely Harjit Leider 04/14/2023, 8:25 AM

## 2023-04-14 NOTE — Patient Instructions (Signed)
Medication Instructions:  NO CHANGES *If you need a refill on your cardiac medications before your next appointment, please call your pharmacy*  Testing/Procedures:  Dr. Rennis Golden has ordered a Lexiscan Myocardial Perfusion Imaging Study. This test is done at [address] - [address]rd Floor  Please arrive 15 minutes prior to your appointment time for registration and insurance purposes.   The test will take approximately 3 to 4 hours to complete; you may bring reading material.  If someone comes with you to your appointment, they will need to remain in the main lobby due to limited space in the testing area. **If you are pregnant or breastfeeding, please notify the nuclear lab prior to your appointment**   How to prepare for your Myocardial Perfusion Test: Do not eat or drink 3 hours prior to your test, except you may have water. Do not consume products containing caffeine (regular or decaffeinated) 12 hours prior to your test. (ex: coffee, chocolate, sodas, tea). Do wear comfortable clothes (no dresses or overalls) and walking shoes, tennis shoes preferred (No heels or open toe shoes are allowed). Do NOT wear cologne, perfume, aftershave, or lotions (deodorant is allowed). If you use an inhaler, use it the AM of your test and bring it with you.  If you use a nebulizer, use it the AM of your test.  If these instructions are not followed, your test will have to be rescheduled.   ZIO XT- Long Term Monitor Instructions  Your physician has requested you wear a ZIO patch monitor for 14 days.  This is a single patch monitor. [employer] supplies one patch monitor per enrollment. Additional stickers are not available. Please do not apply patch if you will be having a Nuclear Stress Test,  Echocardiogram, Cardiac CT, MRI, or Chest Xray during the period you would be wearing the  monitor. The patch cannot be worn during these tests. You cannot remove and re-apply the  ZIO XT patch monitor.  Your ZIO  patch monitor will be mailed 3 day USPS to your address on file. It may take 3-5 days  to receive your monitor after you have been enrolled.  Once you have received your monitor, please review the enclosed instructions. Your monitor  has already been registered assigning a specific monitor serial # to you.  Billing and Patient Assistance Program Information  We have supplied [employer] with any of your insurance information on file for billing purposes. [employer] offers a sliding scale Patient Assistance Program for patients that do not have  insurance, or whose insurance does not completely cover the cost of the ZIO monitor.  You must apply for the Patient Assistance Program to qualify for this discounted rate.  To apply, please call [employer] at [phone], select option 4, select option 2, ask to apply for  Patient Assistance Program. Meredeth Ide will ask your household income, and how many people  are in your household. They will quote your out-of-pocket cost based on that information.  [employer] will also be able to set up a 47-month, interest-free payment plan if needed.  Applying the monitor   Shave hair from upper left chest.  Hold abrader disc by orange tab. Rub abrader in 40 strokes over the upper left chest as  indicated in your monitor instructions.  Clean area with 4 enclosed alcohol pads. Let dry.  Apply patch as indicated in monitor instructions. Patch will be placed under collarbone on left  side of chest with arrow pointing upward.  Rub patch adhesive wings  for 2 minutes. Remove white label marked "1". Remove the white  label marked "2". Rub patch adhesive wings for 2 additional minutes.  While looking in a mirror, press and release button in center of patch. A small green light will  flash 3-4 times. This will be your only indicator that the monitor has been turned on.  Do not shower for the first 24 hours. You may shower after the first 24 hours.  Press the button if you feel a  symptom. You will hear a small click. Record Date, Time and  Symptom in the Patient Logbook.  When you are ready to remove the patch, follow instructions on the last 2 pages of Patient  Logbook. Stick patch monitor onto the last page of Patient Logbook.  Place Patient Logbook in the blue and white box. Use locking tab on box and tape box closed  securely. The blue and white box has prepaid postage on it. Please place it in the mailbox as  soon as possible. Your physician should have your test results approximately 7 days after the  monitor has been mailed back to [facility].  Call [facility] Customer Care at [phone] if you have questions regarding  your ZIO XT patch monitor. Call them immediately if you see an orange light blinking on your  monitor.  If your monitor falls off in less than 4 days, contact our Monitor department at [phone].  If your monitor becomes loose or falls off after 4 days call [employer] at [phone] for  suggestions on securing your monitor    Follow-Up: At [facility], you and your health needs are our priority.  As part of our continuing mission to provide you with exceptional heart care, we have created designated Provider Care Teams.  These Care Teams include your primary Cardiologist (physician) and Advanced Practice Providers (APPs -  Physician Assistants and Nurse Practitioners) who all work together to provide you with the care you need, when you need it.  We recommend signing up for the patient portal called "MyChart".  Sign up information is provided on this After Visit Summary.  MyChart is used to connect with patients for Virtual Visits (Telemedicine).  Patients are able to view lab/test results, encounter notes, upcoming appointments, etc.  Non-urgent messages can be sent to your provider as well.   To learn more about what you can do with MyChart, go to [email or web address].    Your next appointment:   2-3 months with Dr.  Rennis Golden or PA/NP     1st Floor: - Lobby - Registration  - Pharmacy  - Lab - Cafe  2nd Floor: - PV Lab - Diagnostic Testing (echo, CT, nuclear med)  3rd Floor: - Vacant  4th Floor: - TCTS (cardiothoracic surgery) - AFib Clinic - Structural Heart Clinic - Vascular Surgery  - Vascular Ultrasound  5th Floor: - HeartCare Cardiology (general and EP) - Clinical Pharmacy for coumadin, hypertension, lipid, weight-loss medications, and med management appointments    Valet parking services will be available as well.

## 2023-04-15 ENCOUNTER — Telehealth (HOSPITAL_COMMUNITY): Payer: Self-pay | Admitting: *Deleted

## 2023-04-15 ENCOUNTER — Encounter (HOSPITAL_COMMUNITY): Payer: Self-pay

## 2023-04-15 NOTE — Telephone Encounter (Signed)
Spoke to Mrs. Samuelson,and gave her instructions and a number to call back should she have any questions about Mr. Jaco's STRESS TEST scheduled on 04/21/23 at 12:45.

## 2023-04-19 ENCOUNTER — Other Ambulatory Visit: Payer: Self-pay | Admitting: Emergency Medicine

## 2023-04-19 ENCOUNTER — Encounter: Payer: Self-pay | Admitting: Emergency Medicine

## 2023-04-19 ENCOUNTER — Ambulatory Visit (INDEPENDENT_AMBULATORY_CARE_PROVIDER_SITE_OTHER): Payer: Medicare Other | Admitting: Emergency Medicine

## 2023-04-19 VITALS — BP 124/70 | HR 48 | Temp 98.0°F | Ht 74.0 in | Wt 225.0 lb

## 2023-04-19 DIAGNOSIS — G4733 Obstructive sleep apnea (adult) (pediatric): Secondary | ICD-10-CM

## 2023-04-19 DIAGNOSIS — I25709 Atherosclerosis of coronary artery bypass graft(s), unspecified, with unspecified angina pectoris: Secondary | ICD-10-CM

## 2023-04-19 DIAGNOSIS — I10 Essential (primary) hypertension: Secondary | ICD-10-CM

## 2023-04-19 DIAGNOSIS — Z8673 Personal history of transient ischemic attack (TIA), and cerebral infarction without residual deficits: Secondary | ICD-10-CM

## 2023-04-19 DIAGNOSIS — E785 Hyperlipidemia, unspecified: Secondary | ICD-10-CM | POA: Diagnosis not present

## 2023-04-19 DIAGNOSIS — N1831 Chronic kidney disease, stage 3a: Secondary | ICD-10-CM | POA: Diagnosis not present

## 2023-04-19 NOTE — Progress Notes (Signed)
[name] 83 y.o.   Chief Complaint  Patient presents with  . Follow-up    3 month f/u for HTN he states no other concerns. Wants the shingle vaccine     HISTORY OF PRESENT ILLNESS: This is a 83 y.o. male here for follow-up of multiple chronic medical conditions including hypertension Has no complaints or medical concerns today.  HPI   Prior to Admission medications   Medication Sig Start Date End Date Taking? Authorizing Provider  acetaminophen (TYLENOL) 325 MG tablet Take 1-2 tablets (325-650 mg total) by mouth every 4 (four) hours as needed for mild pain. 01/21/18  Yes Love, Evlyn Kanner, PA-C  allopurinol (ZYLOPRIM) 100 MG tablet Take 2 tablets (200 mg total) by mouth daily. 02/18/23  Yes Georgina Quint, MD  amLODipine [facility]) 5 MG tablet Take 1 tablet by mouth once daily 12/28/22  Yes Arynn Armand, Eilleen Kempf, MD  apixaban (ELIQUIS) 5 MG TABS tablet Take 1 tablet by mouth twice daily 12/15/22  Yes Hilty, Lisette Abu, MD  cyclobenzaprine (FLEXERIL) 5 MG tablet Take 1 tablet (5 mg total) by mouth 3 (three) times daily as needed for muscle spasms. 02/19/22  Yes Richardean Sale, DO  finasteride (PROSCAR) 5 MG tablet Take 5 mg by mouth daily. 05/29/22  Yes [provider]  furosemide (LASIX) 20 MG tablet Take 1 tablet (20 mg total) by mouth daily. 10/27/22  Yes Hilty, Lisette Abu, MD  lidocaine (LIDODERM) 5 % Place 1 patch onto the skin daily. Remove & Discard patch within 12 hours or as directed by MD 08/04/22  Yes Wynetta Fines, MD  metoprolol tartrate (LOPRESSOR) 25 MG tablet Take 1/2 (one-half) tablet by mouth twice daily 12/15/22  Yes Hilty, Lisette Abu, MD  mirabegron ER (MYRBETRIQ) 25 MG TB24 tablet Take 25 mg by mouth daily.   Yes [provider]  Multiple Vitamin (MULTIVITAMIN) tablet Take 1 tablet by mouth daily. [employer] multi + Omega 3   Yes [provider]  niacin (NIASPAN) 1000 MG CR tablet    Yes [provider]  niacin (NIASPAN) 500 MG CR  tablet Take 500 mg by mouth at bedtime.   Yes [provider]  Olmesartan Medoxomil (BENICAR PO)    Yes [provider]  pantoprazole (PROTONIX) 40 MG tablet Take 1 tablet (40 mg total) by mouth daily. 01/27/23  Yes Georgina Quint, MD  tamsulosin [facility]) 0.4 MG CAPS capsule Take 1 capsule by mouth at bedtime 11/21/21  Yes Allwardt, Alyssa M, PA-C  Vitamin D, Ergocalciferol, (DRISDOL) 1.25 MG (50000 UNIT) CAPS capsule Take 1 capsule (50,000 Units total) by mouth every 7 (seven) days. 06/23/21  Yes Allwardt, Alyssa M, PA-C  Menthol-Methyl Salicylate (MUSCLE RUB) 10-15 % CREA Apply 1 application topically 4 (four) times daily -  with meals and at bedtime. To neck and shoulders Patient not taking: Reported on 04/14/2023 01/21/18   Jacquelynn Cree, PA-C  rosuvastatin (CRESTOR) 10 MG tablet Take 1 tablet (10 mg total) by mouth at bedtime. 01/14/23 04/14/23  Georgina Quint, MD    Allergies  Allergen Reactions  . Ramipril Cough    Patient Active Problem List   Diagnosis Date Noted  . Clavi 01/30/2022  . Hammer toes, bilateral 01/30/2022  . Coagulation defect (HCC) 10/12/2019  . Anemia, chronic disease   . Left hemiparesis (HCC)   . CKD (chronic kidney disease), stage III (HCC)   . History of gout   . Stage 3 chronic kidney disease (HCC)   .  Anemia of chronic disease   . Left-sided weakness   . Osteoarthritis   . OSA (obstructive sleep apnea)   . Noncompliance with CPAP treatment   . History of DVT (deep vein thrombosis)   . History of CVA (cerebrovascular accident)   . Dysphagia, post-stroke   . CKD (chronic kidney disease) stage 2, GFR 60-89 ml/min 01/01/2018  . 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 Renal insufficiency 12/10/2016  . Hypophosphatemia 12/10/2016  . OBESITY, UNSPECIFIED 05/31/2009  . Hyperlipidemia 05/30/2009  . Obstructive sleep apnea 05/30/2009  . Essential hypertension 05/30/2009  . CAD (coronary artery disease)  05/30/2009    Past Medical History:  Diagnosis Date  . CAD (coronary artery disease)    s/p cath in 2005 and CABG x4  . Cataract   . DJD (degenerative joint disease)   . DJD (degenerative joint disease)   . Fluid retention    mild  . H/O: gout   . History of blood clots   . HTN (hypertension)   . Hyperlipidemia   . Hyperlipidemia   . Myocardial infarction (HCC) 2009  . Obesity    with reduction  . OSA (obstructive sleep apnea)    AHI 98/hr  . Renal insufficiency   . Sleep apnea   . Stroke [facility]) 2019    Past Surgical History:  Procedure Laterality Date  . CORONARY ARTERY BYPASS GRAFT  2005   LIMA to LAD, SVG to OM1 and OM 2, RCA.   [name] left inguinal hernia repair    . LOOP RECORDER INSERTION N/A 01/03/2018   Procedure: LOOP RECORDER INSERTION;  Surgeon: Marinus Maw, MD;  Location: [facility] INVASIVE CV LAB;  Service: Cardiovascular;  Laterality: N/A;  . TEE WITHOUT CARDIOVERSION N/A 12/11/2016   Procedure: TRANSESOPHAGEAL ECHOCARDIOGRAM (TEE);  Surgeon: Chrystie Nose, MD;  Location: [facility] ENDOSCOPY;  Service: Cardiovascular;  Laterality: N/A;  . TEE WITHOUT CARDIOVERSION N/A 01/03/2018   Procedure: TRANSESOPHAGEAL ECHOCARDIOGRAM (TEE);  Surgeon: Lars Masson, MD;  Location: [facility] ENDOSCOPY;  Service: Cardiovascular;  Laterality: N/A;    Social History   Socioeconomic History  . Marital status: Married    Spouse name: [place]  . Number of children: 3  . Years of education: Not on file  . Highest education level: Not on file  Occupational History  . Not on file  Tobacco Use  . Smoking status: Former    Types: Cigars  . Smokeless tobacco: Never  . Tobacco comments:    used to smoke cigars, 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, none since  Vaping Use  . Vaping status: Never Used  Substance and Sexual Activity  . Alcohol use: No  . Drug use: No  . Sexual activity: Not on file  Other Topics Concern  . Not on file  Social History Narrative   Lives with with and one son    Social Drivers of Health   Financial Resource Strain: Low Risk  (02/23/2023)   Overall Financial Resource Strain (CARDIA)   . Difficulty of Paying Living Expenses: Not hard at all  Food Insecurity: No Food Insecurity (02/23/2023)   Hunger Vital Sign   . Worried About Programme researcher, broadcasting/film/video in the Last Year: Never true   . Ran Out of Food in the Last Year: Never true  Transportation Needs: No Transportation Needs (02/23/2023)   PRAPARE - Transportation   . Lack of Transportation (Medical): No   . Lack of Transportation (Non-Medical): No  Physical Activity: Inactive (02/23/2023)  Exercise Vital Sign   . Days of Exercise per Week: 0 days   . Minutes of Exercise per Session: 0 min  Stress: No Stress Concern Present (02/23/2023)   [employer] of Occupational Health - Occupational Stress Questionnaire   . Feeling of Stress : Not at all  Social Connections: Socially Integrated (02/23/2023)   Social Connection and Isolation Panel [NHANES]   . Frequency of Communication with Friends and Family: More than three times a week   . Frequency of Social Gatherings with Friends and Family: Twice a week   . Attends Religious Services: More than 4 times per year   . Active Member of Clubs or Organizations: Yes   . Attends Banker Meetings: Never   . Marital Status: Married  Catering manager Violence: Not At Risk (02/23/2023)   Humiliation, Afraid, Rape, and Kick questionnaire   . Fear of Current or Ex-Partner: No   . Emotionally Abused: No   . Physically Abused: No   . Sexually Abused: No    Family History  Problem Relation Age of Onset  . Gout Mother   . Stroke Mother   . Coronary artery disease Mother   . Hypertension Mother   . Arthritis Mother   . Coronary artery disease Father   . Arthritis Father   . Gout Father   . Stroke Father   . Hypertension Father   . Coronary artery disease Brother   . Arthritis Brother   . Gout Brother   . Stroke Brother   .  Hypertension Brother   . Coronary artery disease Brother   . Arthritis Brother   . Gout Brother   . Stroke Brother   . Hypertension Brother   . Coronary artery disease Other        significant in multiple family members     Review of Systems  Constitutional: Negative.  Negative for chills and fever.  HENT: Negative.  Negative for congestion and sore throat.   Respiratory: Negative.  Negative for cough and shortness of breath.   Cardiovascular: Negative.  Negative for chest pain and palpitations.  Gastrointestinal:  Negative for abdominal pain, diarrhea, nausea and vomiting.  Genitourinary: Negative.  Negative for dysuria and hematuria.  Skin: Negative.  Negative for rash.  Neurological: Negative.  Negative for dizziness and headaches.  All other systems reviewed and are negative.   Vitals:   04/19/23 1031  BP: 124/70  Pulse: (!) 48  Temp: 98 F (36.7 C)  SpO2: 99%    Physical Exam Vitals reviewed.  Constitutional:      Appearance: Normal appearance.  HENT:     Head: Normocephalic.     Mouth/Throat:     Mouth: Mucous membranes are moist.     Pharynx: Oropharynx is clear.  Eyes:     Extraocular Movements: Extraocular movements intact.     Pupils: Pupils are equal, round, and reactive to light.  Cardiovascular:     Rate and Rhythm: Normal rate and regular rhythm.     Pulses: Normal pulses.     Heart sounds: Normal heart sounds.  Pulmonary:     Effort: Pulmonary effort is normal.     Breath sounds: Normal breath sounds.  Musculoskeletal:     Cervical back: No tenderness.  Lymphadenopathy:     Cervical: No cervical adenopathy.  Skin:    General: Skin is warm and dry.     Capillary Refill: Capillary refill takes less than 2 seconds.  Neurological:  General: No focal deficit present.     Mental Status: He is alert and oriented to person, place, and time.  Psychiatric:        Mood and Affect: Mood normal.        Behavior: Behavior normal.     ASSESSMENT &  PLAN: A total of 44 minutes was spent with the patient and counseling/coordination of care regarding preparing for this visit, review of most recent office visit notes, review of multiple chronic medical conditions and their management, review of all medications, review of most recent bloodwork results, review of health maintenance items, education on nutrition, prognosis, documentation, and need for follow up.   Problem List Items Addressed This Visit       Cardiovascular and Mediastinum   Essential hypertension - Primary   Well-controlled hypertension. Continue amlodipine 5 mg daily and Benicar 40 mg daily        CAD (coronary artery disease)   Stable.  No recent anginal episodes. Continue metoprolol tartrate 12.5 mg twice a day Sees cardiologist on a regular basis        Respiratory   OSA (obstructive sleep apnea)   Noncompliant with CPAP treatment         Genitourinary   Stage 3 chronic kidney disease (HCC)   Stable. Advised to stay well-hydrated and avoid NSAIDs        Other   Hyperlipidemia   Stable chronic condition Continue rosuvastatin 10 mg daily      History of CVA (cerebrovascular accident)   Stable.  Secondary stroke prevention discussed Well-controlled hypertension On cholesterol medication Continues Eliquis 5 mg twice a day Diet and nutrition discussed      Patient Instructions  Health Maintenance After Age 67 After age 53, you are at a higher risk for certain long-term diseases and infections as well as injuries from falls. Falls are a major cause of broken bones and head injuries in people who are older than age 50. Getting regular preventive care can help to keep you healthy and well. Preventive care includes getting regular testing and making lifestyle changes as recommended by your health care provider. Talk with your health care provider about: Which screenings and tests you should have. A screening is a test that checks for a disease when you  have no symptoms. A diet and exercise plan that is right for you. What should I know about screenings and tests to prevent falls? Screening and testing are the best ways to find a health problem early. Early diagnosis and treatment give you the best chance of managing medical conditions that are common after age 4. Certain conditions and lifestyle choices may make you more likely to have a fall. Your health care provider may recommend: Regular vision checks. Poor vision and conditions such as cataracts can make you more likely to have a fall. If you wear glasses, make sure to get your prescription updated if your vision changes. Medicine review. Work with your health care provider to regularly review all of the medicines you are taking, including over-the-counter medicines. Ask your health care provider about any side effects that may make you more likely to have a fall. Tell your health care provider if any medicines that you take make you feel dizzy or sleepy. Strength and balance checks. Your health care provider may recommend certain tests to check your strength and balance while standing, walking, or changing positions. Foot health exam. Foot pain and numbness, as well as not wearing proper footwear, can  make you more likely to have a fall. Screenings, including: Osteoporosis screening. Osteoporosis is a condition that causes the bones to get weaker and break more easily. Blood pressure screening. Blood pressure changes and medicines to control blood pressure can make you feel dizzy. Depression screening. You may be more likely to have a fall if you have a fear of falling, feel depressed, or feel unable to do activities that you used to do. Alcohol use screening. Using too much alcohol can affect your balance and may make you more likely to have a fall. Follow these instructions at home: Lifestyle Do not drink alcohol if: Your health care provider tells you not to drink. If you drink  alcohol: Limit how much you have to: 0-1 drink a day for women. 0-2 drinks a day for men. Know how much alcohol is in your drink. In the [place] one drink equals one 12 oz bottle of beer (355 mL), one 5 oz glass of wine (148 mL), or one 1 oz glass of hard liquor (44 mL). Do not use any products that contain nicotine or tobacco. These products include cigarettes, chewing tobacco, and vaping devices, such as e-cigarettes. If you need help quitting, ask your health care provider. Activity  Follow a regular exercise program to stay fit. This will help you maintain your balance. Ask your health care provider what types of exercise are appropriate for you. If you need a cane or walker, use it as recommended by your health care provider. Wear supportive shoes that have nonskid soles. Safety  Remove any tripping hazards, such as rugs, cords, and clutter. Install safety equipment such as grab bars in bathrooms and safety rails on stairs. Keep rooms and walkways well-lit. General instructions Talk with your health care provider about your risks for falling. Tell your health care provider if: You fall. Be sure to tell your health care provider about all falls, even ones that seem minor. You feel dizzy, tiredness (fatigue), or off-balance. Take over-the-counter and prescription medicines only as told by your health care provider. These include supplements. Eat a healthy diet and maintain a healthy weight. A healthy diet includes low-fat dairy products, low-fat (lean) meats, and fiber from whole grains, beans, and lots of fruits and vegetables. Stay current with your vaccines. Schedule regular health, dental, and eye exams. Summary Having a healthy lifestyle and getting preventive care can help to protect your health and wellness after age 72. Screening and testing are the best way to find a health problem early and help you avoid having a fall. Early diagnosis and treatment give you the best chance for  managing medical conditions that are more common for people who are older than age 7. Falls are a major cause of broken bones and head injuries in people who are older than age 81. Take precautions to prevent a fall at home. Work with your health care provider to learn what changes you can make to improve your health and wellness and to prevent falls. This information is not intended to replace advice given to you by your health care provider. Make sure you discuss any questions you have with your health care provider. Document Revised: 07/08/2020 Document Reviewed: 07/08/2020 Elsevier Patient Education  2024 Elsevier Inc.    Edwina Barth, MD  Primary Care at [facility]

## 2023-04-19 NOTE — Assessment & Plan Note (Signed)
Stable.  No recent anginal episodes. Continue metoprolol tartrate 12.5 mg twice a day Sees cardiologist on a regular basis

## 2023-04-19 NOTE — Assessment & Plan Note (Signed)
Stable.  Secondary stroke prevention discussed Well-controlled hypertension On cholesterol medication Continues Eliquis 5 mg twice a day Diet and nutrition discussed

## 2023-04-19 NOTE — Patient Instructions (Signed)
 Health Maintenance After Age 81 After age 4, you are at a higher risk for certain long-term diseases and infections as well as injuries from falls. Falls are a major cause of broken bones and head injuries in people who are older than age 47. Getting regular preventive care can help to keep you healthy and well. Preventive care includes getting regular testing and making lifestyle changes as recommended by your health care provider. Talk with your health care provider about: Which screenings and tests you should have. A screening is a test that checks for a disease when you have no symptoms. A diet and exercise plan that is right for you. What should I know about screenings and tests to prevent falls? Screening and testing are the best ways to find a health problem early. Early diagnosis and treatment give you the best chance of managing medical conditions that are common after age 37. Certain conditions and lifestyle choices may make you more likely to have a fall. Your health care provider may recommend: Regular vision checks. Poor vision and conditions such as cataracts can make you more likely to have a fall. If you wear glasses, make sure to get your prescription updated if your vision changes. Medicine review. Work with your health care provider to regularly review all of the medicines you are taking, including over-the-counter medicines. Ask your health care provider about any side effects that may make you more likely to have a fall. Tell your health care provider if any medicines that you take make you feel dizzy or sleepy. Strength and balance checks. Your health care provider may recommend certain tests to check your strength and balance while standing, walking, or changing positions. Foot health exam. Foot pain and numbness, as well as not wearing proper footwear, can make you more likely to have a fall. Screenings, including: Osteoporosis screening. Osteoporosis is a condition that causes  the bones to get weaker and break more easily. Blood pressure screening. Blood pressure changes and medicines to control blood pressure can make you feel dizzy. Depression screening. You may be more likely to have a fall if you have a fear of falling, feel depressed, or feel unable to do activities that you used to do. Alcohol use screening. Using too much alcohol can affect your balance and may make you more likely to have a fall. Follow these instructions at home: Lifestyle Do not drink alcohol if: Your health care provider tells you not to drink. If you drink alcohol: Limit how much you have to: 0-1 drink a day for women. 0-2 drinks a day for men. Know how much alcohol is in your drink. In the [place] one drink equals one 12 oz bottle of beer (355 mL), one 5 oz glass of wine (148 mL), or one 1 oz glass of hard liquor (44 mL). Do not use any products that contain nicotine or tobacco. These products include cigarettes, chewing tobacco, and vaping devices, such as e-cigarettes. If you need help quitting, ask your health care provider. Activity  Follow a regular exercise program to stay fit. This will help you maintain your balance. Ask your health care provider what types of exercise are appropriate for you. If you need a cane or walker, use it as recommended by your health care provider. Wear supportive shoes that have nonskid soles. Safety  Remove any tripping hazards, such as rugs, cords, and clutter. Install safety equipment such as grab bars in bathrooms and safety rails on stairs. Keep rooms and walkways  well-lit. General instructions Talk with your health care provider about your risks for falling. Tell your health care provider if: You fall. Be sure to tell your health care provider about all falls, even ones that seem minor. You feel dizzy, tiredness (fatigue), or off-balance. Take over-the-counter and prescription medicines only as told by your health care provider. These include  supplements. Eat a healthy diet and maintain a healthy weight. A healthy diet includes low-fat dairy products, low-fat (lean) meats, and fiber from whole grains, beans, and lots of fruits and vegetables. Stay current with your vaccines. Schedule regular health, dental, and eye exams. Summary Having a healthy lifestyle and getting preventive care can help to protect your health and wellness after age 11. Screening and testing are the best way to find a health problem early and help you avoid having a fall. Early diagnosis and treatment give you the best chance for managing medical conditions that are more common for people who are older than age 28. Falls are a major cause of broken bones and head injuries in people who are older than age 48. Take precautions to prevent a fall at home. Work with your health care provider to learn what changes you can make to improve your health and wellness and to prevent falls. This information is not intended to replace advice given to you by your health care provider. Make sure you discuss any questions you have with your health care provider. Document Revised: 07/08/2020 Document Reviewed: 07/08/2020 Elsevier Patient Education  2024 [employer].

## 2023-04-19 NOTE — Assessment & Plan Note (Signed)
Noncompliant with CPAP treatment

## 2023-04-19 NOTE — Assessment & Plan Note (Signed)
 Stable.  Advised to stay well-hydrated and avoid NSAIDs.

## 2023-04-19 NOTE — Assessment & Plan Note (Signed)
Stable chronic condition.  Continue rosuvastatin 10 mg daily. 

## 2023-04-19 NOTE — Assessment & Plan Note (Signed)
 Well-controlled hypertension. Continue amlodipine 5 mg daily and Benicar 40 mg daily

## 2023-04-20 ENCOUNTER — Other Ambulatory Visit: Payer: Self-pay | Admitting: Emergency Medicine

## 2023-04-21 ENCOUNTER — Ambulatory Visit (HOSPITAL_COMMUNITY): Payer: Medicare Other

## 2023-04-22 ENCOUNTER — Ambulatory Visit: Payer: Medicare Other | Admitting: Podiatry

## 2023-04-23 ENCOUNTER — Ambulatory Visit (HOSPITAL_COMMUNITY): Payer: Medicare Other | Attending: Internal Medicine

## 2023-04-23 DIAGNOSIS — R001 Bradycardia, unspecified: Secondary | ICD-10-CM | POA: Diagnosis not present

## 2023-04-23 DIAGNOSIS — Z951 Presence of aortocoronary bypass graft: Secondary | ICD-10-CM | POA: Diagnosis not present

## 2023-04-23 DIAGNOSIS — R42 Dizziness and giddiness: Secondary | ICD-10-CM | POA: Insufficient documentation

## 2023-04-23 DIAGNOSIS — R5383 Other fatigue: Secondary | ICD-10-CM | POA: Insufficient documentation

## 2023-04-23 LAB — MYOCARDIAL PERFUSION IMAGING
Base ST Depression (mm): 0 mm
LV dias vol: 125 mL (ref 62–150)
LV sys vol: 54 mL
Nuc Stress EF: 57 %
Peak HR: 76 {beats}/min
Rest HR: 42 {beats}/min
Rest Nuclear Isotope Dose: 11 mCi
SDS: 0
SRS: 0
SSS: 0
ST Depression (mm): 0 mm
Stress Nuclear Isotope Dose: 31.3 mCi
TID: 1.09

## 2023-04-23 MED ORDER — REGADENOSON 0.4 MG/5ML IV SOLN
0.4000 mg | Freq: Once | INTRAVENOUS | Status: AC
  Administered 2023-04-23: 0.4 mg via INTRAVENOUS

## 2023-04-23 MED ORDER — TECHNETIUM TC 99M TETROFOSMIN IV KIT
11.0000 | PACK | Freq: Once | INTRAVENOUS | Status: AC | PRN
  Administered 2023-04-23: 11 via INTRAVENOUS

## 2023-04-23 MED ORDER — TECHNETIUM TC 99M TETROFOSMIN IV KIT
31.3000 | PACK | Freq: Once | INTRAVENOUS | Status: AC | PRN
  Administered 2023-04-23: 31.3 via INTRAVENOUS

## 2023-04-25 ENCOUNTER — Other Ambulatory Visit: Payer: Self-pay | Admitting: Emergency Medicine

## 2023-05-07 ENCOUNTER — Ambulatory Visit: Payer: Medicare Other | Admitting: Internal Medicine

## 2023-05-17 ENCOUNTER — Other Ambulatory Visit: Payer: Self-pay | Admitting: Emergency Medicine

## 2023-05-24 DIAGNOSIS — R42 Dizziness and giddiness: Secondary | ICD-10-CM | POA: Diagnosis not present

## 2023-05-24 DIAGNOSIS — R001 Bradycardia, unspecified: Secondary | ICD-10-CM | POA: Diagnosis not present

## 2023-05-25 ENCOUNTER — Other Ambulatory Visit: Payer: Self-pay | Admitting: *Deleted

## 2023-06-16 NOTE — Progress Notes (Signed)
 Cardiology Office Note:    Date:  06/28/2023   ID:  [name], [name] [birth year], MRN [id number]  PCP:  Shane Hammersmith, MD  Cardiologist:  Shane Lites, MD     Referring MD: [name], *   Chief Complaint: follow-up of dizziness   History of Present Illness:    [name] is a 83 y.o. male with a history of CAD s/p remote CABG x4 (LIMA-LAD, SVG-OM1, SVG-OM2, SVG-RCA) in 2005, paroxysmal atrial flutter on Eliquis , stroke in 12/2017 s/p loop recorder, hypertension, hyperlipidemia, obstructive sleep apnea, obesity, and gout who is followed by Dr. Maximo [name] and presents today for follow-up of dizziness.  Patient has a long history of CAD with remote CABG x4 with LIMA to LAD, SVG to OM1, SVG to OM2, and SVG to RCA in 2005. Myoview  in 08/2017 was low risk with no evidence of ischemia. He was admitted in 12/2017 for an acute stroke. TEE showed LVEF of 60-65% with normal wall motion, mild TR, and no evidence of left atrial appendage thrombus. He had a loop recorder placed at that time which showed paroxysmal atrial flutter and he was started on Eliquis .   He was last seen by Dr. Maximo [name] in 04/2023 at which time he reported a couple of episodes of dizziness with associated nausea and hot flashes the month prior. He was actually seen in the ED for this and EKG showed sinus bradycardia with rates in the 40s. 2 week Zio monitor and Myoview  were ordered for further evaluation. Myoview  was low risk and showed a fixed defect consistent with diaphragmatic attenuation but no ischemia. Monitor showed predominantly sinus rhythm with 1st degree AV block and occasional 2nd degree type 1 AV block (Wenckebach). Minimum heart rate was 30 bpm (with associated Wenckebach) and sinus bradycardia at 37 bpm was noted overnight. Metoprolol  was stopped.   Patient patients today for follow-up. He is doing well from a cardiac standpoint. He denies any dizziness since stopping the Metoprolol . No chest pain,  shortness of breath, orthopnea, PND, edema, palpitations, dizziness, or near syncope/ syncope. His only complaint today is some lower to mid back pain over the last week that started after he stood up a certain way. He states the pain is worse when he twist. It does not radiating down his legs. No red flag symptoms like saddle paresthesia or loss of control of bladder/ bowel. This sounds musculoskeletal.   EKGs/Labs/Other Studies Reviewed:    The following studies were reviewed:  Myoview  04/23/2023:   Resting ECG is normal. ECG rhythm shows sinus bradycardia.   A pharmacological stress test was performed using IV Lexiscan  0.4mg  over 10 seconds performed without concurrent submaximal exercise. Normal blood pressure and normal heart rate response noted during stress. Heart rate recovery was normal.   No ST deviation was noted compared to baseline EKG. ECG was interpretable and without significant changes. The ECG was not diagnostic due to pharmacologic protocol.   LV perfusion is abnormal. There is no evidence of ischemia. There is no evidence of infarction. Defect 1: There is a small defect with moderate reduction in uptake present in the apical inferior location(s) that is fixed. There is normal wall motion in the defect area. Consistent with artifact caused by diaphragmatic attenuation.   Left ventricular function is normal. Nuclear stress EF: 57%. The left ventricular ejection fraction is normal (55-65%). End diastolic cavity size is normal. End systolic cavity size is normal. No evidence of transient ischemic dilation (TID) noted.  Prior study not available for comparison.   Findings are consistent with no ischemia. The study is low risk. _______________  Monitor 04/23/2023 to 04/30/2023: Patient had a min HR of 30 bpm, max HR of 147 bpm, and avg HR of 62 bpm. Predominant underlying rhythm was Sinus Rhythm. First Degree AV Block was present. Second Degree AV Block-Mobitz I (Wenckebach) was present.  Isolated SVEs were occasional (1.7%,  10943), and no SVE Couplets or SVE Triplets were present. Isolated VEs were rare (<1.0%, 4349), VE Couplets were rare (<1.0%, 32), and VE Triplets were rare (<1.0%, 1). Ventricular Trigeminy was present.   Monitor showed sinus rhythm with 1st degree AVB - occasional Mobitz 2, type 1 (wenckebach) was noted. PAC's with a frequency of 1.7% were noted. No high degree AVB or afib noted. Minimum HR 30 (Associated with Wenckebach) and sinus brady at 37 was noted overnight.  EKG:  EKG not ordered today.   Recent Labs: 03/09/2023: ALT 7; BUN 15; Creatinine, Ser 1.31; Hemoglobin 13.3; Platelets 203.0; Potassium 4.3; Pro B Natriuretic peptide (BNP) 87.0; Sodium 143  Recent Lipid Panel    Component Value Date/Time   CHOL 175 07/14/2022 1106   CHOL 186 09/07/2017 1617   TRIG 82.0 07/14/2022 1106   HDL 49.20 07/14/2022 1106   HDL 39 (L) 09/07/2017 1617   CHOLHDL 4 07/14/2022 1106   VLDL 16.4 07/14/2022 1106   LDLCALC 110 (H) 07/14/2022 1106   LDLCALC 126 (H) 09/07/2017 1617    Physical Exam:    Vital Signs: BP 132/80 (BP Location: Left Arm, Patient Position: Sitting, Cuff Size: Normal)   Pulse (!) 58   Ht 6\' 2"  (1.88 m)   Wt 221 lb 3.2 oz (100.3 kg)   SpO2 99%   BMI 28.40 kg/m     Wt Readings from Last 3 Encounters:  06/28/23 221 lb 3.2 oz (100.3 kg)  04/23/23 226 lb (102.5 kg)  04/19/23 225 lb (102.1 kg)     General: 83 y.o. African-American male in no acute distress. HEENT: Normocephalic and atraumatic. Sclera clear.  Neck: Supple. No carotid bruits. No JVD. Heart: Mildly bradycardic with normal rhythm. No murmurs, gallops, or rubs.  Lungs: No increased work of breathing. Clear to ausculation bilaterally. No wheezes, rhonchi, or rales.  Extremities: Trace lower extremity edema bilaterally. Skin: Warm and dry. Neuro: No focal deficits. Psych: Normal affect. Responds appropriately.   Assessment:    1. Coronary artery disease involving native  coronary artery of native heart without angina pectoris   2. S/P CABG (coronary artery bypass graft)   3. Paroxysmal atrial flutter (HCC)   4. Dizziness   5. Essential hypertension   6. Hyperlipidemia, unspecified hyperlipidemia type   7. Obstructive sleep apnea   8. Low back pain without sciatica, unspecified back pain laterality, unspecified chronicity     Plan:    CAD s/p CABG S/p CABG x4 in 2005. Recent Myoview  in 04/2023 was low risk with no evidence of ischemia. - No chest pain.  - No aspirin  due to need for full anticoagulation.  - Continue Crestor  10mg  daily.   Paroxysmal Atrial Flutter  Noted on loop recorder which was placed in 12/2017 after stroke. - Maintaining sinus rhythm on exam. - Not on any AV nodal agents due to bradycardia.  - Continue Eliquis  5mg  twice daily.  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 Minimum heart rate was 30 bpm (with associated Wenckebach) and sinus bradycardia at 37 bpm was noted overnight. Metoprolol  was stopped.  - No recurrent dizziness since stopping beta-blocker. - Continue to avoid AV nodal agents.  - No need for pacemaker at this time.   Hypertension BP well controlled.  - Continue Amlodipine  5mg  daily. - Continue Olmesartan. Unclear what does he is taking. He does not remember. Asked patient to go home and look and then let us  know. - Also on Lasix  20mg  daily. He does not remember why he is on this but sounds like it may be for lower extremity edema.  Hyperlipidemia Lipid panel in 07/2022: Total Cholesterol 175, Triglycerides 82, HDL 49.2, LDL 110. LDL goal <70 given CAD.  - Continue Crestor  10mg  daily and Niacin  500mg  daily. - Labs followed by PCP.   Obstructive Sleep Apnea - Continue CPAP.  - Did not specifically discuss today.  Back Pain Patient  describes some back pain over the last week. It sounds like he likely pulled a muscle standing up a certain way.  - Recommended over the counter Tylenol . Would avoid oral NSAIDs given heart history but can use Voltaren  gel. - If no improvement with above measures, recommend following up with PCP.  Oft note, patient states his wife typically helps him with his medications but she now has dementia. He would like to have his medications organized in a pill back. Asked patient to let us  know what dose of Olmesartan he is taking and then we can help arrange this.  Disposition: Follow up in 6 months.    Signed, Casimer Clear, PA-C  06/28/2023 1:47 PM    [facility]

## 2023-06-21 ENCOUNTER — Ambulatory Visit (INDEPENDENT_AMBULATORY_CARE_PROVIDER_SITE_OTHER): Payer: Medicare Other | Admitting: Podiatry

## 2023-06-21 ENCOUNTER — Encounter: Payer: Self-pay | Admitting: Podiatry

## 2023-06-21 DIAGNOSIS — M79675 Pain in left toe(s): Secondary | ICD-10-CM

## 2023-06-21 DIAGNOSIS — B351 Tinea unguium: Secondary | ICD-10-CM

## 2023-06-21 DIAGNOSIS — M79674 Pain in right toe(s): Secondary | ICD-10-CM | POA: Diagnosis not present

## 2023-06-21 DIAGNOSIS — N182 Chronic kidney disease, stage 2 (mild): Secondary | ICD-10-CM

## 2023-06-21 DIAGNOSIS — I739 Peripheral vascular disease, unspecified: Secondary | ICD-10-CM

## 2023-06-21 NOTE — Progress Notes (Signed)
 This patient returns to my office for at risk foot care.  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  Patient is taking eliquis .  This patient is unable to cut nails himself since the patient cannot reach his nails.These nails are painful walking and wearing shoes.  This patient presents for at risk foot care today.  General Appearance  Alert, conversant and in no acute stress.  Vascular  Dorsalis pedis  are weakly palpable bilaterally.  Posterior tibial pulses are absent due to swelling. Posterior tibial pulses are not palpable.Capillary return is within normal limits  bilaterally. Temperature is within normal limits  bilaterally.  Neurologic  Senn-Weinstein monofilament wire test within normal limits  bilaterally. Muscle power within normal limits bilaterally.  Nails Thick disfigured discolored nails with subungual debris  from hallux to fifth toes bilaterally. No evidence of bacterial infection or drainage bilaterally.  Orthopedic  No limitations of motion  feet .  No crepitus or effusions noted.  No bony pathology or digital deformities noted.  HAV  B/L.  Skin  normotropic skin with no porokeratosis noted bilaterally.  No signs of infections or ulcers noted.   Clavi 3rd toe right foot. Pinch callus  Onychomycosis  Pain in right toes  Pain in left toes  Clavi third toe right foot.  Pinch callus  B/L  Consent was obtained for treatment procedures.   Mechanical debridement of nails 1-5  bilaterally performed with a nail nipper.  Filed with dremel without incident.  Debride clavi  and pinch callus with dremel.    Return office visit  10  weeks               Told patient to return for periodic foot care and evaluation due to potential at risk complications.   Ruffin Cotton DPM

## 2023-06-28 ENCOUNTER — Ambulatory Visit: Payer: Medicare Other | Attending: Student | Admitting: Student

## 2023-06-28 ENCOUNTER — Encounter: Payer: Self-pay | Admitting: Student

## 2023-06-28 VITALS — BP 132/80 | HR 58 | Ht 74.0 in | Wt 221.2 lb

## 2023-06-28 DIAGNOSIS — G4733 Obstructive sleep apnea (adult) (pediatric): Secondary | ICD-10-CM

## 2023-06-28 DIAGNOSIS — E785 Hyperlipidemia, unspecified: Secondary | ICD-10-CM

## 2023-06-28 DIAGNOSIS — I4892 Unspecified atrial flutter: Secondary | ICD-10-CM | POA: Diagnosis not present

## 2023-06-28 DIAGNOSIS — M545 Low back pain, unspecified: Secondary | ICD-10-CM

## 2023-06-28 DIAGNOSIS — I2510 Atherosclerotic heart disease of native coronary artery without angina pectoris: Secondary | ICD-10-CM

## 2023-06-28 DIAGNOSIS — I10 Essential (primary) hypertension: Secondary | ICD-10-CM | POA: Diagnosis not present

## 2023-06-28 DIAGNOSIS — R42 Dizziness and giddiness: Secondary | ICD-10-CM

## 2023-06-28 DIAGNOSIS — Z951 Presence of aortocoronary bypass graft: Secondary | ICD-10-CM | POA: Diagnosis not present

## 2023-06-28 NOTE — Patient Instructions (Signed)
 Medication Instructions:  TAKE TYLENOL  AND VOLTAREN  GEL OVER-THE-COUNTER DOSE FOR YOUR BACK PAIN CALL WITH YOUR OLMESARTAN DOSE *If you need a refill on your cardiac medications before your next appointment, please call your pharmacy*  Lab Work: NONE  Follow-Up: At [facility], you and your health needs are our priority.  As part of our continuing mission to provide you with exceptional heart care, our providers are all part of one team.  This team includes your primary Cardiologist (physician) and Advanced Practice Providers or APPs (Physician Assistants and Nurse Practitioners) who all work together to provide you with the care you need, when you need it.  Your next appointment:   6 month(s)  Provider:   Hazle Lites, MD or Callie Goodrich, PA-C

## 2023-06-30 ENCOUNTER — Other Ambulatory Visit: Payer: Self-pay | Admitting: Emergency Medicine

## 2023-07-01 ENCOUNTER — Telehealth: Payer: Self-pay | Admitting: Student

## 2023-07-01 NOTE — Telephone Encounter (Signed)
 Attempted phone call to pt. And left voicemail message to contact office at [phone].

## 2023-07-01 NOTE — Telephone Encounter (Signed)
 New message  Pt walked in to let nurse know he does not take OLMESARTAN. He was seen on 04.28.2025 and advised to call with medication dosage but he does not take the medication.   Also, pt wants someone to call him about pre-packaging his medications.

## 2023-07-06 NOTE — Telephone Encounter (Signed)
 Pt seen by Sharren Decree, PA on 06/28/23:  Hypertension BP well controlled.  - Continue Amlodipine  5mg  daily. - Continue Olmesartan. Unclear what does he is taking. He does not remember. Asked patient to go home and look and then let us  know.  Called and spoke with spouse on DPR and advised that since he's not been taking it and his pressure has been good, then to keep doing what he's been doing and I would update MAR here. Advised her to call [facility] and gave her the number. She appreciates call back.

## 2023-07-06 NOTE — Addendum Note (Signed)
 Addended by: Keller Patella on: 07/06/2023 03:21 PM   Modules accepted: Orders

## 2023-07-15 ENCOUNTER — Other Ambulatory Visit (HOSPITAL_COMMUNITY): Payer: Self-pay

## 2023-07-16 ENCOUNTER — Telehealth: Payer: Self-pay | Admitting: Emergency Medicine

## 2023-07-16 ENCOUNTER — Other Ambulatory Visit: Payer: Self-pay

## 2023-07-16 ENCOUNTER — Other Ambulatory Visit: Payer: Self-pay | Admitting: Emergency Medicine

## 2023-07-16 NOTE — Telephone Encounter (Signed)
 Copied from CRM [id number]. Topic: Clinical - Medication Refill >> Jul 16, 2023 11:51 AM [name] wrote: Medication:apixaban  (ELIQUIS ) 5 MG TABS   Has the patient contacted their pharmacy? No (Agent: If no, request that the patient contact the pharmacy for the refill. If patient does not wish to contact the pharmacy document the reason why and proceed with request.) (Agent: If yes, when and what did the pharmacy advise?)  This is the patient's preferred pharmacy:   [facility] [address] ([address] [place] - [address] [place] ([place]) [place] [address] Phone: [phone] Fax: [phone]  Is this the correct pharmacy for this prescription? Yes If no, delete pharmacy and type the correct one.   Has the prescription been filled recently? Yes  Is the patient out of the medication? Yes  Has the patient been seen for an appointment in the last year OR does the patient have an upcoming appointment? Yes  Can we respond through MyChart? Yes  Agent: Please be advised that Rx refills may take up to 3 business days. We ask that you follow-up with your pharmacy. >> Jul 16, 2023 11:54 AM [name] wrote: Please call patient's daughter with an update on medication: STEPHANIE Weyand (DAUGHTER) [phone].

## 2023-07-16 NOTE — Telephone Encounter (Signed)
 Copied from CRM [id number]. Topic: Clinical - Medication Refill >> Jul 16, 2023 11:54 AM [name] wrote: Medication: finasteride (PROSCAR) 5 MG tablet  Has the patient contacted their pharmacy? No (Agent: If no, request that the patient contact the pharmacy for the refill. If patient does not wish to contact the pharmacy document the reason why and proceed with request.) (Agent: If yes, when and what did the pharmacy advise?)  This is the patient's preferred pharmacy:   [facility] [address] ([address] [place] - [address] [place] ([place]) [place] [address] Phone: [phone] Fax: [phone]  Is this the correct pharmacy for this prescription? Yes If no, delete pharmacy and type the correct one.   Has the prescription been filled recently? No  Is the patient out of the medication? No  Has the patient been seen for an appointment in the last year OR does the patient have an upcoming appointment? Yes  Can we respond through MyChart? Yes  Agent: Please be advised that Rx refills may take up to 3 business days. We ask that you follow-up with your pharmacy.

## 2023-07-16 NOTE — Telephone Encounter (Signed)
 Copied from CRM [id number]. Topic: Clinical - Medication Refill >> Jul 16, 2023 11:51 AM [name] wrote: Medication:apixaban  (ELIQUIS ) 5 MG TABS   Has the patient contacted their pharmacy? No (Agent: If no, request that the patient contact the pharmacy for the refill. If patient does not wish to contact the pharmacy document the reason why and proceed with request.) (Agent: If yes, when and what did the pharmacy advise?)  This is the patient's preferred pharmacy:   [facility] [address] ([address] [place] - [address] [place] ([place]) [place] [address] Phone: [phone] Fax: [phone]  Is this the correct pharmacy for this prescription? Yes If no, delete pharmacy and type the correct one.   Has the prescription been filled recently? Yes  Is the patient out of the medication? Yes  Has the patient been seen for an appointment in the last year OR does the patient have an upcoming appointment? Yes  Can we respond through MyChart? Yes  Agent: Please be advised that Rx refills may take up to 3 business days. We ask that you follow-up with your pharmacy.

## 2023-07-16 NOTE — Telephone Encounter (Signed)
 Copied from CRM [id number]. Topic: Clinical - Medication Refill >> Jul 16, 2023 12:09 PM Chuck Crater wrote: Medication: allopurinol  (ZYLOPRIM ) 100 MG tablet, amLODipine  (NORVASC ) 5 MG tablet, tamsulosin  (FLOMAX ) 0.4 MG CAPS capsule,  Has the patient contacted their pharmacy? No (Agent: If no, request that the patient contact the pharmacy for the refill. If patient does not wish to contact the pharmacy document the reason why and proceed with request.) (Agent: If yes, when and what did the pharmacy advise?) advised to contact doctor to get pre package request  This is the patient's preferred pharmacy:  [id number] - [place] ([place]), [place] - [address] [place] ([place]) [place] [address] Phone: [phone] Fax: [phone]  Is this the correct pharmacy for this prescription? Yes If no, delete pharmacy and type the correct one.   Has the prescription been filled recently? Yes  Is the patient out of the medication? Yes  Has the patient been seen for an appointment in the last year OR does the patient have an upcoming appointment? Yes  Can we respond through MyChart? No  Agent: Please be advised that Rx refills may take up to 3 business days. We ask that you follow-up with your pharmacy.

## 2023-07-17 ENCOUNTER — Other Ambulatory Visit: Payer: Self-pay | Admitting: Internal Medicine

## 2023-07-17 DIAGNOSIS — I4892 Unspecified atrial flutter: Secondary | ICD-10-CM

## 2023-07-19 MED ORDER — FINASTERIDE 5 MG PO TABS: 5.0000 mg | ORAL_TABLET | Freq: Every day | ORAL | 0 refills | Status: AC

## 2023-07-19 MED ORDER — TAMSULOSIN HCL 0.4 MG PO CAPS: 0.4000 mg | ORAL_CAPSULE | Freq: Every day | ORAL | 0 refills | Status: AC

## 2023-07-19 NOTE — Telephone Encounter (Signed)
 Has since been filled by cardiologist

## 2023-07-19 NOTE — Telephone Encounter (Signed)
 Medication has since been filled by cardiologist

## 2023-09-20 ENCOUNTER — Ambulatory Visit: Admitting: Podiatry

## 2023-09-20 ENCOUNTER — Encounter: Payer: Self-pay | Admitting: Podiatry

## 2023-09-20 DIAGNOSIS — N182 Chronic kidney disease, stage 2 (mild): Secondary | ICD-10-CM

## 2023-09-20 DIAGNOSIS — B351 Tinea unguium: Secondary | ICD-10-CM

## 2023-09-20 DIAGNOSIS — M79675 Pain in left toe(s): Secondary | ICD-10-CM | POA: Diagnosis not present

## 2023-09-20 DIAGNOSIS — L84 Corns and callosities: Secondary | ICD-10-CM

## 2023-09-20 DIAGNOSIS — M79674 Pain in right toe(s): Secondary | ICD-10-CM | POA: Diagnosis not present

## 2023-09-20 NOTE — Progress Notes (Signed)
 This patient returns to my office for at risk foot care.  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  Patient is taking eliquis .  This patient is unable to cut nails himself since the patient cannot reach his nails.These nails are painful walking and wearing shoes. He also has painful pinch callus both feet. This patient presents for at risk foot care today.  General Appearance  Alert, conversant and in no acute stress.  Vascular  Dorsalis pedis  are weakly palpable bilaterally.  Posterior tibial pulses are absent due to swelling. Posterior tibial pulses are not palpable.Capillary return is within normal limits  bilaterally. Temperature is within normal limits  bilaterally.  Neurologic  Senn-Weinstein monofilament wire test within normal limits  bilaterally. Muscle power within normal limits bilaterally.  Nails Thick disfigured discolored nails with subungual debris  from hallux to fifth toes bilaterally. No evidence of bacterial infection or drainage bilaterally.  Orthopedic  No limitations of motion  feet .  No crepitus or effusions noted.  No bony pathology or digital deformities noted.  HAV  B/L.  Skin  normotropic skin with no porokeratosis noted bilaterally.  No signs of infections or ulcers noted.   Clavi 3rd toe right foot. Pinch callus  Onychomycosis  Pain in right toes  Pain in left toes  Clavi third toe right foot.  Pinch callus  B/L  Consent was obtained for treatment procedures.   Mechanical debridement of nails 1-5  bilaterally performed with a nail nipper.  Filed with dremel without incident.  Debride clavi  and pinch callus with dremel.  Debride pinch callus.   Return office visit  10  weeks               Told patient to return for periodic foot care and evaluation due to potential at risk complications.   Cordella Bold DPM

## 2023-09-29 NOTE — Telephone Encounter (Signed)
 error

## 2023-09-30 ENCOUNTER — Other Ambulatory Visit: Payer: Self-pay | Admitting: Emergency Medicine

## 2023-10-13 ENCOUNTER — Other Ambulatory Visit: Payer: Self-pay | Admitting: Emergency Medicine

## 2023-11-04 ENCOUNTER — Ambulatory Visit: Payer: Self-pay | Admitting: Emergency Medicine

## 2023-11-04 ENCOUNTER — Ambulatory Visit (INDEPENDENT_AMBULATORY_CARE_PROVIDER_SITE_OTHER): Admitting: Emergency Medicine

## 2023-11-04 ENCOUNTER — Encounter: Payer: Self-pay | Admitting: Emergency Medicine

## 2023-11-04 VITALS — BP 122/72 | HR 53 | Temp 97.6°F | Ht 74.0 in | Wt 213.0 lb

## 2023-11-04 DIAGNOSIS — R4189 Other symptoms and signs involving cognitive functions and awareness: Secondary | ICD-10-CM

## 2023-11-04 DIAGNOSIS — D638 Anemia in other chronic diseases classified elsewhere: Secondary | ICD-10-CM | POA: Diagnosis not present

## 2023-11-04 DIAGNOSIS — N183 Chronic kidney disease, stage 3 unspecified: Secondary | ICD-10-CM

## 2023-11-04 DIAGNOSIS — Z8673 Personal history of transient ischemic attack (TIA), and cerebral infarction without residual deficits: Secondary | ICD-10-CM | POA: Diagnosis not present

## 2023-11-04 DIAGNOSIS — I25709 Atherosclerosis of coronary artery bypass graft(s), unspecified, with unspecified angina pectoris: Secondary | ICD-10-CM

## 2023-11-04 DIAGNOSIS — D689 Coagulation defect, unspecified: Secondary | ICD-10-CM | POA: Diagnosis not present

## 2023-11-04 DIAGNOSIS — I10 Essential (primary) hypertension: Secondary | ICD-10-CM

## 2023-11-04 DIAGNOSIS — R4689 Other symptoms and signs involving appearance and behavior: Secondary | ICD-10-CM

## 2023-11-04 DIAGNOSIS — G4733 Obstructive sleep apnea (adult) (pediatric): Secondary | ICD-10-CM

## 2023-11-04 LAB — CBC WITH DIFFERENTIAL/PLATELET
Basophils Absolute: 0 K/uL (ref 0.0–0.1)
Basophils Relative: 0.7 % (ref 0.0–3.0)
Eosinophils Absolute: 0.3 K/uL (ref 0.0–0.7)
Eosinophils Relative: 5.7 % — ABNORMAL HIGH (ref 0.0–5.0)
HCT: 39.7 % (ref 39.0–52.0)
Hemoglobin: 12.8 g/dL — ABNORMAL LOW (ref 13.0–17.0)
Lymphocytes Relative: 29.4 % (ref 12.0–46.0)
Lymphs Abs: 1.7 K/uL (ref 0.7–4.0)
MCHC: 32.3 g/dL (ref 30.0–36.0)
MCV: 90.3 fl (ref 78.0–100.0)
Monocytes Absolute: 0.5 K/uL (ref 0.1–1.0)
Monocytes Relative: 9.2 % (ref 3.0–12.0)
Neutro Abs: 3.1 K/uL (ref 1.4–7.7)
Neutrophils Relative %: 55 % (ref 43.0–77.0)
Platelets: 200 K/uL (ref 150.0–400.0)
RBC: 4.4 Mil/uL (ref 4.22–5.81)
RDW: 14.7 % (ref 11.5–15.5)
WBC: 5.7 K/uL (ref 4.0–10.5)

## 2023-11-04 LAB — COMPREHENSIVE METABOLIC PANEL WITH GFR
ALT: 8 U/L (ref 0–53)
AST: 17 U/L (ref 0–37)
Albumin: 3.8 g/dL (ref 3.5–5.2)
Alkaline Phosphatase: 54 U/L (ref 39–117)
BUN: 25 mg/dL — ABNORMAL HIGH (ref 6–23)
CO2: 30 meq/L (ref 19–32)
Calcium: 9.5 mg/dL (ref 8.4–10.5)
Chloride: 105 meq/L (ref 96–112)
Creatinine, Ser: 1.49 mg/dL (ref 0.40–1.50)
GFR: 43.77 mL/min — ABNORMAL LOW (ref >60.00)
Glucose, Bld: 91 mg/dL (ref 70–99)
Potassium: 4.4 meq/L (ref 3.5–5.1)
Sodium: 143 meq/L (ref 135–145)
Total Bilirubin: 0.5 mg/dL (ref 0.2–1.2)
Total Protein: 6.8 g/dL (ref 6.0–8.3)

## 2023-11-04 LAB — VITAMIN D 25 HYDROXY (VIT D DEFICIENCY, FRACTURES): VITD: 15.6 ng/mL — ABNORMAL LOW (ref 30.00–100.00)

## 2023-11-04 LAB — HEMOGLOBIN A1C: Hgb A1c MFr Bld: 5.8 % (ref 4.6–6.5)

## 2023-11-04 LAB — VITAMIN B12: Vitamin B-12: 165 pg/mL — ABNORMAL LOW (ref 211–911)

## 2023-11-04 LAB — LIPID PANEL
Cholesterol: 158 mg/dL (ref 0–200)
HDL: 52.8 mg/dL (ref >39.00)
LDL Cholesterol: 89 mg/dL (ref 0–99)
NonHDL: 105.63
Total CHOL/HDL Ratio: 3
Triglycerides: 82 mg/dL (ref 0.0–149.0)
VLDL: 16.4 mg/dL (ref 0.0–40.0)

## 2023-11-04 LAB — TSH: TSH: 1.69 u[IU]/mL (ref 0.35–5.50)

## 2023-11-04 LAB — PSA: PSA: 1.26 ng/mL (ref 0.10–4.00)

## 2023-11-04 NOTE — Assessment & Plan Note (Signed)
Stable.  No recent anginal episodes. Continue metoprolol tartrate 12.5 mg twice a day Sees cardiologist on a regular basis

## 2023-11-04 NOTE — Assessment & Plan Note (Signed)
 BP Readings from Last 3 Encounters:  11/04/23 122/72  06/28/23 132/80  04/19/23 124/70  Well-controlled hypertension. Continue amlodipine  5 mg daily and Benicar 40 mg daily

## 2023-11-04 NOTE — Assessment & Plan Note (Addendum)
 Progressively getting worse and affecting quality of life Family very concerned.  Differential diagnosis including possibility of dementia discussed. Recommend neuropsychiatric evaluation Neurology referral placed today Will follow-up after neurology evaluation
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
## 2023-11-04 NOTE — Assessment & Plan Note (Signed)
Stable.  CBC done today. 

## 2023-11-04 NOTE — Assessment & Plan Note (Signed)
Stable.  Secondary stroke prevention discussed Well-controlled hypertension On cholesterol medication Continues Eliquis 5 mg twice a day Diet and nutrition discussed

## 2023-11-04 NOTE — Assessment & Plan Note (Signed)
Noncompliant with CPAP treatment

## 2023-11-04 NOTE — Assessment & Plan Note (Signed)
 Advised to stay well-hydrated and avoid NSAIDs Clinically stable.  CMP repeated today

## 2023-11-04 NOTE — Assessment & Plan Note (Signed)
On long-term anticoagulation with Eliquis 5 mg twice daily Fall precautions given

## 2023-11-04 NOTE — Patient Instructions (Signed)
 Health Maintenance After Age 81 After age 27, you are at a higher risk for certain long-term diseases and infections as well as injuries from falls. Falls are a major cause of broken bones and head injuries in people who are older than age 73. Getting regular preventive care can help to keep you healthy and well. Preventive care includes getting regular testing and making lifestyle changes as recommended by your health care provider. Talk with your health care provider about: Which screenings and tests you should have. A screening is a test that checks for a disease when you have no symptoms. A diet and exercise plan that is right for you. What should I know about screenings and tests to prevent falls? Screening and testing are the best ways to find a health problem early. Early diagnosis and treatment give you the best chance of managing medical conditions that are common after age 90. Certain conditions and lifestyle choices may make you more likely to have a fall. Your health care provider may recommend: Regular vision checks. Poor vision and conditions such as cataracts can make you more likely to have a fall. If you wear glasses, make sure to get your prescription updated if your vision changes. Medicine review. Work with your health care provider to regularly review all of the medicines you are taking, including over-the-counter medicines. Ask your health care provider about any side effects that may make you more likely to have a fall. Tell your health care provider if any medicines that you take make you feel dizzy or sleepy. Strength and balance checks. Your health care provider may recommend certain tests to check your strength and balance while standing, walking, or changing positions. Foot health exam. Foot pain and numbness, as well as not wearing proper footwear, can make you more likely to have a fall. Screenings, including: Osteoporosis screening. Osteoporosis is a condition that causes  the bones to get weaker and break more easily. Blood pressure screening. Blood pressure changes and medicines to control blood pressure can make you feel dizzy. Depression screening. You may be more likely to have a fall if you have a fear of falling, feel depressed, or feel unable to do activities that you used to do. Alcohol  use screening. Using too much alcohol  can affect your balance and may make you more likely to have a fall. Follow these instructions at home: Lifestyle Do not drink alcohol  if: Your health care provider tells you not to drink. If you drink alcohol : Limit how much you have to: 0-1 drink a day for women. 0-2 drinks a day for men. Know how much alcohol  is in your drink. In the [place] one drink equals one 12 oz bottle of beer (355 mL), one 5 oz glass of wine (148 mL), or one 1 oz glass of hard liquor (44 mL). Do not use any products that contain nicotine or tobacco. These products include cigarettes, chewing tobacco, and vaping devices, such as e-cigarettes. If you need help quitting, ask your health care provider. Activity  Follow a regular exercise program to stay fit. This will help you maintain your balance. Ask your health care provider what types of exercise are appropriate for you. If you need a cane or walker, use it as recommended by your health care provider. Wear supportive shoes that have nonskid soles. Safety  Remove any tripping hazards, such as rugs, cords, and clutter. Install safety equipment such as grab bars in bathrooms and safety rails on stairs. Keep rooms and walkways  well-lit. General instructions Talk with your health care provider about your risks for falling. Tell your health care provider if: You fall. Be sure to tell your health care provider about all falls, even ones that seem minor. You feel dizzy, tiredness (fatigue), or off-balance. Take over-the-counter and prescription medicines only as told by your health care provider. These include  supplements. Eat a healthy diet and maintain a healthy weight. A healthy diet includes low-fat dairy products, low-fat (lean) meats, and fiber from whole grains, beans, and lots of fruits and vegetables. Stay current with your vaccines. Schedule regular health, dental, and eye exams. Summary Having a healthy lifestyle and getting preventive care can help to protect your health and wellness after age 15. Screening and testing are the best way to find a health problem early and help you avoid having a fall. Early diagnosis and treatment give you the best chance for managing medical conditions that are more common for people who are older than age 42. Falls are a major cause of broken bones and head injuries in people who are older than age 64. Take precautions to prevent a fall at home. Work with your health care provider to learn what changes you can make to improve your health and wellness and to prevent falls. This information is not intended to replace advice given to you by your health care provider. Make sure you discuss any questions you have with your health care provider. Document Revised: 07/08/2020 Document Reviewed: 07/08/2020 Elsevier Patient Education  2024 [employer].

## 2023-11-26 ENCOUNTER — Encounter: Payer: Self-pay | Admitting: Physician Assistant

## 2023-11-29 ENCOUNTER — Encounter: Payer: Self-pay | Admitting: Podiatry

## 2023-11-29 ENCOUNTER — Other Ambulatory Visit: Payer: Self-pay | Admitting: Internal Medicine

## 2023-11-29 ENCOUNTER — Ambulatory Visit: Admitting: Podiatry

## 2023-11-29 DIAGNOSIS — M79675 Pain in left toe(s): Secondary | ICD-10-CM

## 2023-11-29 DIAGNOSIS — B351 Tinea unguium: Secondary | ICD-10-CM

## 2023-11-29 DIAGNOSIS — N182 Chronic kidney disease, stage 2 (mild): Secondary | ICD-10-CM

## 2023-11-29 DIAGNOSIS — M79674 Pain in right toe(s): Secondary | ICD-10-CM | POA: Diagnosis not present

## 2023-11-29 NOTE — Progress Notes (Signed)
 This patient returns to my office for at risk foot care.  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  Patient is taking eliquis .  This patient is unable to cut nails himself since the patient cannot reach his nails.These nails are painful walking and wearing shoes. He also has painful pinch callus both feet. This patient presents for at risk foot care today.  General Appearance  Alert, conversant and in no acute stress.  Vascular  Dorsalis pedis  are weakly palpable bilaterally.  Posterior tibial pulses are absent due to swelling. Posterior tibial pulses are not palpable.Capillary return is within normal limits  bilaterally. Temperature is within normal limits  bilaterally.  Neurologic  Senn-Weinstein monofilament wire test within normal limits  bilaterally. Muscle power within normal limits bilaterally.  Nails Thick disfigured discolored nails with subungual debris  from hallux to fifth toes bilaterally. No evidence of bacterial infection or drainage bilaterally.  Orthopedic  No limitations of motion  feet .  No crepitus or effusions noted.  No bony pathology or digital deformities noted.  HAV  B/L.  Skin  normotropic skin with no porokeratosis noted bilaterally.  No signs of infections or ulcers noted.   Clavi 3rd toe right foot. Pinch callus  Onychomycosis  Pain in right toes  Pain in left toes  Clavi third toe right foot.  Pinch callus  B/L  Consent was obtained for treatment procedures.   Mechanical debridement of nails 1-5  bilaterally performed with a nail nipper.  Filed with dremel without incident.  Debride clavi  and pinch callus with dremel.  Debride pinch callus.   Return office visit  10  weeks               Told patient to return for periodic foot care and evaluation due to potential at risk complications.   Cordella Bold DPM
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
## 2024-01-03 ENCOUNTER — Encounter: Payer: Self-pay | Admitting: Physician Assistant

## 2024-01-03 ENCOUNTER — Encounter

## 2024-02-14 ENCOUNTER — Telehealth: Payer: Self-pay

## 2024-02-14 NOTE — Telephone Encounter (Signed)
 Patient called to schedule an appointment with Dr. Loreda for diabetic foot care.

## 2024-02-15 ENCOUNTER — Ambulatory Visit: Admitting: Podiatry

## 2024-02-15 ENCOUNTER — Encounter: Payer: Self-pay | Admitting: Podiatry

## 2024-02-15 DIAGNOSIS — M79675 Pain in left toe(s): Secondary | ICD-10-CM | POA: Diagnosis not present

## 2024-02-15 DIAGNOSIS — N182 Chronic kidney disease, stage 2 (mild): Secondary | ICD-10-CM | POA: Diagnosis not present

## 2024-02-15 DIAGNOSIS — M79674 Pain in right toe(s): Secondary | ICD-10-CM | POA: Diagnosis not present

## 2024-02-15 DIAGNOSIS — B351 Tinea unguium: Secondary | ICD-10-CM | POA: Diagnosis not present

## 2024-02-15 NOTE — Progress Notes (Signed)
 This patient returns to my office for at risk foot care.  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  Patient is taking eliquis .  This patient is unable to cut nails himself since the patient cannot reach his nails.These nails are painful walking and wearing shoes. He also has painful pinch callus both feet. This patient presents for at risk foot care today.  General Appearance  Alert, conversant and in no acute stress.  Vascular  Dorsalis pedis  are weakly palpable bilaterally.  Posterior tibial pulses are absent due to swelling. Posterior tibial pulses are not palpable.Capillary return is within normal limits  bilaterally. Temperature is within normal limits  bilaterally.  Neurologic  Senn-Weinstein monofilament wire test within normal limits  bilaterally. Muscle power within normal limits bilaterally.  Nails Thick disfigured discolored nails with subungual debris  from hallux to fifth toes bilaterally. No evidence of bacterial infection or drainage bilaterally.  Orthopedic  No limitations of motion  feet .  No crepitus or effusions noted.  No bony pathology or digital deformities noted.  HAV  B/L.  Skin  normotropic skin with no porokeratosis noted bilaterally.  No signs of infections or ulcers noted.   Clavi 3rd toe right foot. Pinch callus  Onychomycosis  Pain in right toes  Pain in left toes  Clavi third toe right foot.  Pinch callus  B/L  Consent was obtained for treatment procedures.   Mechanical debridement of nails 1-5  bilaterally performed with a nail nipper.  Filed with dremel without incident.   Debride pinch callus with dremel.  Debride pinch callus.   Return office visit  10  weeks               Told patient to return for periodic foot care and evaluation due to potential at risk complications.   Cordella Bold DPM

## 2024-03-08 ENCOUNTER — Other Ambulatory Visit: Payer: Self-pay | Admitting: Student

## 2024-03-08 DIAGNOSIS — I4892 Unspecified atrial flutter: Secondary | ICD-10-CM

## 2024-03-08 NOTE — Telephone Encounter (Signed)
 Eliquis  5mg  refill request received. Patient is 84 years old, weight-96.6kg, Crea-1.49 on 11/04/23, Diagnosis-Aflutter, and last seen by Aline Door on 06/28/23. Dose is appropriate based on dosing criteria. Will send in refill to requested pharmacy.

## 2024-03-16 NOTE — Progress Notes (Incomplete)
 "      Memory Impairment   [name] is a very pleasant 84 y.o. year old RH male with a history of CAD s/p CABG x4, HTN, HLD, MI, CKD, ACD, bilateral hearing loss, prior strokes and history of blood clots with recent diagnosis of right MCA infarct in 12/2017 with residual mild left upper extremity weakness***with loop recorder inserted to assess for atrial fibrillation as potential cause of stroke which did show a dense of atrial fibrillation on 03/10/2018 with initiation of Eliquis , OSA on CPAP seen today for evaluation of memory loss. MoCA today is ***. Etiology is ***. Patient is able to participate on ADLs and to to drive without difficulties. Mood is *** . Patient is accompanied by *** who supplement the history.  Follow up in *** months  *** pending on the above results  MRI brain to further evaluate for structural abnormalities and vascular load  Neuropsychological evaluation for further investigate other causes of memory loss including sleep, attention, anxiety, depression among others Check B12 TSH   Discussed the use of AI scribe software for clinical note transcription with the patient, who gave verbal consent to proceed.  History of Present Illness      How long did patient have memory difficulties?  For about.  Patient reports some difficulty remembering new information, recent conversations, names. repeats oneself?  Endorsed Disoriented when walking into a room? Denies ***  Leaving objects in unusual places?  Denies.   Wandering behavior? Denies.   Any personality changes, or depression, anxiety? Denies *** Hallucinations or paranoia? Denies.   Seizures? Denies.    Any sleep changes?  Sleeps well *** Does not sleep well. **  frequent nightmares or dream reenactment, other REM behavior or sleepwalking   Sleep apnea? Denies.   Any hygiene concerns?  Denies.   Independent of bathing and dressing? Endorsed  Does the patient need help with medications?  is in charge *** Who  is in charge of the finances?  is in charge   *** Any changes in appetite?   Denies. ***   Patient have trouble swallowing?  Denies.   Does the patient cook? No*** yes, denies forgetting common recipes or kitchen accidents *** Any headaches?  Denies.   Chronic pain? Denies.   Ambulates with difficulty? Denies. ***  Needs a cane*** Needs a walker *** to ambulate for stability.   Recent falls or head injuries? Denies.     Vision changes?  Denies any new issues.  Has a history of*** Any strokelike symptoms? Denies.   Any tremors? Denies. *** Any anosmia? Denies.   Any incontinence of urine? Denies.   Any bowel dysfunction? Denies.      Patient lives with ***  History of heavy alcohol intake? Denies.   History of heavy tobacco use? Denies.   Family history of dementia?   *** with dementia  Does patient drive? No longer drives  *** yes, denies getting lost.***     Allergies[1]  Current Outpatient Medications  Medication Instructions   acetaminophen  (TYLENOL ) 325-650 mg, Oral, Every 4 hours PRN   allopurinol  (ZYLOPRIM ) 200 mg, Oral, Daily   amLODipine  (NORVASC ) 5 mg, Oral, Daily   cyclobenzaprine  (FLEXERIL ) 5 mg, Oral, 3 times daily PRN   Eliquis  5 mg, Oral, 2 times daily   finasteride  (PROSCAR ) 5 mg, Oral, Daily   furosemide  (LASIX ) 20 mg, Oral, Daily   lidocaine  (LIDODERM ) 5 % 1 patch, Transdermal, Every 24 hours, Remove & Discard patch within 12 hours  or as directed by MD   Menthol -Methyl Salicylate  (MUSCLE RUB) 10-15 % CREA 1 application , Topical, 3 times daily with meals & bedtime, To neck and shoulders   mirabegron ER (MYRBETRIQ) 25 mg, Daily   Multiple Vitamin (MULTIVITAMIN) tablet 1 tablet, Daily   niacin  (VITAMIN B3) 500 mg, Daily at bedtime   pantoprazole  (PROTONIX ) 40 mg, Oral, Daily   rosuvastatin  (CRESTOR ) 10 mg, Oral, Daily at bedtime   tamsulosin  (FLOMAX ) 0.4 mg, Oral, Daily at bedtime   Vitamin D  (Ergocalciferol ) (DRISDOL ) 50,000 Units, Oral, Every 7 days     VITALS:  There were no vitals filed for this visit.   Neurological Exam      No data to display              No data to display            Orientation:  Alert and oriented to person, place and not to time***. No aphasia or dysarthria. Fund of knowledge is appropriate. Recent and remote memory impaired.  Attention and concentration are reduced***.  Able to name objects and repeat phrases. *** Delayed recall  /5 .*** Cranial nerves: There is good facial symmetry. Extraocular muscles are intact and visual fields are full to confrontational testing. Speech is fluent and clear. No tongue deviation. Hearing is intact to conversational tone.*** Tone: Tone is good throughout. Sensation: Sensation is intact to light touch.  Vibration is intact at the bilateral big toe.  Coordination: The patient has no difficulty with RAM's or FNF bilaterally. Normal finger to nose  Motor: Strength is 5/5 in the bilateral upper and lower extremities. There is no pronator drift. There are no fasciculations noted. DTR's: Deep tendon reflexes are 1/4 bilaterally. Gait and Station: The patient is able to ambulate without difficulty. Gait is cautious and narrow. Stride length is normal. ***      Thank you for allowing us  the opportunity to participate in the care of this nice patient. Please do not hesitate to contact us  for any questions or concerns.   Total time spent on today's visit was *** minutes dedicated to this patient today, preparing to see patient, examining the patient, ordering tests and/or medications and counseling the patient, documenting clinical information in the EHR or other health record, independently interpreting results and communicating results to the patient/family, discussing treatment and goals, answering patient's questions and coordinating care.  Cc:  Purcell Emil Schanz, MD  Camie Sevin 03/16/2024 6:16 PM      [1]  Allergies Allergen Reactions   Ramipril Cough   "

## 2024-03-17 ENCOUNTER — Encounter: Payer: Self-pay | Admitting: Physician Assistant

## 2024-03-17 ENCOUNTER — Encounter

## 2024-04-25 ENCOUNTER — Ambulatory Visit: Admitting: Podiatry
